# Patient Record
Sex: Female | Born: 1940 | Race: Black or African American | Hispanic: No | Smoking: Never smoker
Health system: Southern US, Community
[De-identification: ages and names within clinical notes are randomized; demographics above are authoritative.]

## PROBLEM LIST (undated history)

## (undated) DIAGNOSIS — I480 Paroxysmal atrial fibrillation: Secondary | ICD-10-CM

## (undated) DIAGNOSIS — K559 Vascular disorder of intestine, unspecified: Secondary | ICD-10-CM

## (undated) DIAGNOSIS — J45909 Unspecified asthma, uncomplicated: Secondary | ICD-10-CM

## (undated) DIAGNOSIS — I4891 Unspecified atrial fibrillation: Secondary | ICD-10-CM

## (undated) DIAGNOSIS — K922 Gastrointestinal hemorrhage, unspecified: Secondary | ICD-10-CM

## (undated) DIAGNOSIS — M545 Low back pain, unspecified: Secondary | ICD-10-CM

## (undated) DIAGNOSIS — I119 Hypertensive heart disease without heart failure: Secondary | ICD-10-CM

## (undated) DIAGNOSIS — Z860100 Personal history of colon polyps, unspecified: Secondary | ICD-10-CM

## (undated) DIAGNOSIS — K589 Irritable bowel syndrome without diarrhea: Secondary | ICD-10-CM

## (undated) DIAGNOSIS — I2510 Atherosclerotic heart disease of native coronary artery without angina pectoris: Secondary | ICD-10-CM

## (undated) DIAGNOSIS — K219 Gastro-esophageal reflux disease without esophagitis: Secondary | ICD-10-CM

## (undated) DIAGNOSIS — I639 Cerebral infarction, unspecified: Secondary | ICD-10-CM

## (undated) DIAGNOSIS — G8929 Other chronic pain: Secondary | ICD-10-CM

## (undated) DIAGNOSIS — J449 Chronic obstructive pulmonary disease, unspecified: Secondary | ICD-10-CM

## (undated) DIAGNOSIS — E119 Type 2 diabetes mellitus without complications: Secondary | ICD-10-CM

## (undated) DIAGNOSIS — M1990 Unspecified osteoarthritis, unspecified site: Secondary | ICD-10-CM

## (undated) DIAGNOSIS — G459 Transient cerebral ischemic attack, unspecified: Secondary | ICD-10-CM

## (undated) DIAGNOSIS — I10 Essential (primary) hypertension: Secondary | ICD-10-CM

## (undated) HISTORY — DX: Hypertensive heart disease without heart failure: I11.9

## (undated) HISTORY — PX: FOOT SURGERY: SHX648

## (undated) HISTORY — DX: Low back pain: M54.5

## (undated) HISTORY — DX: Low back pain, unspecified: M54.50

## (undated) HISTORY — PX: ABDOMINAL HYSTERECTOMY: SHX81

## (undated) HISTORY — DX: Unspecified atrial fibrillation: I48.91

## (undated) HISTORY — PX: STOMACH SURGERY: SHX791

## (undated) HISTORY — DX: Atherosclerotic heart disease of native coronary artery without angina pectoris: I25.10

## (undated) HISTORY — DX: Personal history of colonic polyps: Z86.010

## (undated) HISTORY — DX: Type 2 diabetes mellitus without complications: E11.9

## (undated) HISTORY — DX: Personal history of colon polyps, unspecified: Z86.0100

## (undated) HISTORY — DX: Other chronic pain: G89.29

## (undated) HISTORY — DX: Cerebral infarction, unspecified: I63.9

## (undated) HISTORY — DX: Irritable bowel syndrome, unspecified: K58.9

## (undated) HISTORY — PX: APPENDECTOMY: SHX54

## (undated) HISTORY — PX: CHOLECYSTECTOMY: SHX55

## (undated) HISTORY — DX: Unspecified asthma, uncomplicated: J45.909

## (undated) HISTORY — DX: Transient cerebral ischemic attack, unspecified: G45.9

## (undated) HISTORY — PX: BACK SURGERY: SHX140

## (undated) HISTORY — DX: Chronic obstructive pulmonary disease, unspecified: J44.9

## (undated) HISTORY — PX: TONSILLECTOMY: SUR1361

## (undated) HISTORY — DX: Vascular disorder of intestine, unspecified: K55.9

## (undated) HISTORY — DX: Gastro-esophageal reflux disease without esophagitis: K21.9

## (undated) HISTORY — PX: HEMORRHOID BANDING: SHX5850

## (undated) HISTORY — DX: Paroxysmal atrial fibrillation: I48.0

## (undated) HISTORY — DX: Gastrointestinal hemorrhage, unspecified: K92.2

## (undated) HISTORY — DX: Unspecified osteoarthritis, unspecified site: M19.90

## (undated) HISTORY — DX: Essential (primary) hypertension: I10

---

## 2002-12-31 ENCOUNTER — Encounter: Payer: Self-pay | Admitting: Neurosurgery

## 2003-01-05 ENCOUNTER — Inpatient Hospital Stay (HOSPITAL_COMMUNITY): Admission: RE | Admit: 2003-01-05 | Discharge: 2003-01-11 | Payer: Self-pay | Admitting: Neurosurgery

## 2003-01-05 ENCOUNTER — Encounter: Payer: Self-pay | Admitting: Neurosurgery

## 2003-02-28 ENCOUNTER — Encounter: Payer: Self-pay | Admitting: Neurosurgery

## 2003-02-28 ENCOUNTER — Encounter: Admission: RE | Admit: 2003-02-28 | Discharge: 2003-02-28 | Payer: Self-pay | Admitting: Neurosurgery

## 2003-04-11 ENCOUNTER — Encounter: Admission: RE | Admit: 2003-04-11 | Discharge: 2003-04-11 | Payer: Self-pay | Admitting: Neurosurgery

## 2003-04-11 ENCOUNTER — Encounter: Payer: Self-pay | Admitting: Neurosurgery

## 2003-07-12 ENCOUNTER — Encounter: Payer: Self-pay | Admitting: Neurosurgery

## 2003-07-12 ENCOUNTER — Encounter: Admission: RE | Admit: 2003-07-12 | Discharge: 2003-07-12 | Payer: Self-pay | Admitting: Neurosurgery

## 2004-03-06 ENCOUNTER — Encounter: Admission: RE | Admit: 2004-03-06 | Discharge: 2004-03-06 | Payer: Self-pay | Admitting: Neurosurgery

## 2004-10-02 ENCOUNTER — Ambulatory Visit: Payer: Self-pay | Admitting: Internal Medicine

## 2004-10-16 ENCOUNTER — Ambulatory Visit: Payer: Self-pay | Admitting: Pain Medicine

## 2004-10-22 ENCOUNTER — Ambulatory Visit: Payer: Self-pay | Admitting: Pain Medicine

## 2004-10-23 ENCOUNTER — Ambulatory Visit: Payer: Self-pay | Admitting: Internal Medicine

## 2004-10-25 ENCOUNTER — Emergency Department: Payer: Self-pay | Admitting: General Practice

## 2004-11-20 ENCOUNTER — Ambulatory Visit: Payer: Self-pay | Admitting: Pain Medicine

## 2004-11-27 ENCOUNTER — Ambulatory Visit: Payer: Self-pay | Admitting: Internal Medicine

## 2004-12-20 ENCOUNTER — Ambulatory Visit: Payer: Self-pay | Admitting: Pain Medicine

## 2004-12-23 ENCOUNTER — Ambulatory Visit: Payer: Self-pay | Admitting: Internal Medicine

## 2005-01-15 ENCOUNTER — Emergency Department: Payer: Self-pay | Admitting: Emergency Medicine

## 2005-01-17 ENCOUNTER — Ambulatory Visit: Payer: Self-pay | Admitting: Pain Medicine

## 2005-01-17 ENCOUNTER — Ambulatory Visit: Payer: Self-pay | Admitting: Internal Medicine

## 2005-01-18 ENCOUNTER — Ambulatory Visit: Payer: Self-pay | Admitting: Internal Medicine

## 2005-01-24 ENCOUNTER — Ambulatory Visit: Payer: Self-pay | Admitting: Internal Medicine

## 2005-01-28 ENCOUNTER — Ambulatory Visit: Payer: Self-pay | Admitting: Pain Medicine

## 2005-02-07 ENCOUNTER — Ambulatory Visit: Payer: Self-pay | Admitting: Internal Medicine

## 2005-02-20 ENCOUNTER — Ambulatory Visit: Payer: Self-pay | Admitting: Internal Medicine

## 2005-03-05 ENCOUNTER — Ambulatory Visit: Payer: Self-pay | Admitting: Pain Medicine

## 2005-03-13 ENCOUNTER — Ambulatory Visit: Payer: Self-pay | Admitting: Pain Medicine

## 2005-03-26 ENCOUNTER — Ambulatory Visit: Payer: Self-pay | Admitting: Internal Medicine

## 2005-04-11 ENCOUNTER — Ambulatory Visit: Payer: Self-pay | Admitting: Pain Medicine

## 2005-04-17 ENCOUNTER — Ambulatory Visit: Payer: Self-pay | Admitting: Pain Medicine

## 2005-04-22 ENCOUNTER — Ambulatory Visit: Payer: Self-pay | Admitting: Internal Medicine

## 2005-05-08 ENCOUNTER — Ambulatory Visit: Payer: Self-pay | Admitting: Internal Medicine

## 2005-05-22 ENCOUNTER — Ambulatory Visit: Payer: Self-pay | Admitting: Internal Medicine

## 2005-05-27 ENCOUNTER — Ambulatory Visit: Payer: Self-pay | Admitting: Pain Medicine

## 2005-06-17 ENCOUNTER — Emergency Department: Payer: Self-pay | Admitting: Internal Medicine

## 2005-06-17 ENCOUNTER — Other Ambulatory Visit: Payer: Self-pay

## 2005-06-20 ENCOUNTER — Ambulatory Visit: Payer: Self-pay | Admitting: Internal Medicine

## 2005-06-22 ENCOUNTER — Ambulatory Visit: Payer: Self-pay | Admitting: Internal Medicine

## 2005-06-27 ENCOUNTER — Ambulatory Visit: Payer: Self-pay | Admitting: Pain Medicine

## 2005-07-03 ENCOUNTER — Ambulatory Visit: Payer: Self-pay | Admitting: Pain Medicine

## 2005-07-25 ENCOUNTER — Ambulatory Visit: Payer: Self-pay | Admitting: Pain Medicine

## 2005-07-27 ENCOUNTER — Ambulatory Visit: Payer: Self-pay | Admitting: Internal Medicine

## 2005-09-09 ENCOUNTER — Ambulatory Visit: Payer: Self-pay | Admitting: Pain Medicine

## 2005-09-24 ENCOUNTER — Ambulatory Visit: Payer: Self-pay | Admitting: Pain Medicine

## 2005-10-24 ENCOUNTER — Ambulatory Visit: Payer: Self-pay | Admitting: Pain Medicine

## 2005-10-28 ENCOUNTER — Ambulatory Visit: Payer: Self-pay | Admitting: Pain Medicine

## 2005-11-19 ENCOUNTER — Ambulatory Visit: Payer: Self-pay | Admitting: Pain Medicine

## 2005-11-25 ENCOUNTER — Ambulatory Visit: Payer: Self-pay | Admitting: Pain Medicine

## 2005-12-26 ENCOUNTER — Ambulatory Visit: Payer: Self-pay | Admitting: Pain Medicine

## 2006-01-01 ENCOUNTER — Ambulatory Visit: Payer: Self-pay | Admitting: Pain Medicine

## 2006-01-30 ENCOUNTER — Ambulatory Visit: Payer: Self-pay | Admitting: Pain Medicine

## 2006-02-03 ENCOUNTER — Ambulatory Visit: Payer: Self-pay | Admitting: Pain Medicine

## 2006-02-25 ENCOUNTER — Ambulatory Visit: Payer: Self-pay | Admitting: Pain Medicine

## 2006-03-03 ENCOUNTER — Ambulatory Visit: Payer: Self-pay | Admitting: Pain Medicine

## 2006-03-25 ENCOUNTER — Ambulatory Visit: Payer: Self-pay | Admitting: Pain Medicine

## 2006-04-02 ENCOUNTER — Ambulatory Visit: Payer: Self-pay | Admitting: Pain Medicine

## 2006-04-22 ENCOUNTER — Ambulatory Visit: Payer: Self-pay | Admitting: Pain Medicine

## 2006-05-05 ENCOUNTER — Ambulatory Visit: Payer: Self-pay | Admitting: Pain Medicine

## 2006-05-13 ENCOUNTER — Ambulatory Visit: Payer: Self-pay | Admitting: Internal Medicine

## 2006-05-27 ENCOUNTER — Ambulatory Visit: Payer: Self-pay | Admitting: Pain Medicine

## 2006-06-04 ENCOUNTER — Ambulatory Visit: Payer: Self-pay | Admitting: Pain Medicine

## 2006-07-03 ENCOUNTER — Ambulatory Visit: Payer: Self-pay | Admitting: Pain Medicine

## 2006-07-09 ENCOUNTER — Ambulatory Visit: Payer: Self-pay | Admitting: Pain Medicine

## 2006-07-22 ENCOUNTER — Ambulatory Visit: Payer: Self-pay | Admitting: Pain Medicine

## 2006-07-28 ENCOUNTER — Ambulatory Visit: Payer: Self-pay | Admitting: Pain Medicine

## 2006-08-21 ENCOUNTER — Ambulatory Visit: Payer: Self-pay | Admitting: Pain Medicine

## 2006-08-27 ENCOUNTER — Ambulatory Visit: Payer: Self-pay | Admitting: Pain Medicine

## 2006-08-30 ENCOUNTER — Ambulatory Visit: Payer: Self-pay | Admitting: Neurosurgery

## 2006-09-16 ENCOUNTER — Ambulatory Visit: Payer: Self-pay | Admitting: Pain Medicine

## 2006-10-06 ENCOUNTER — Ambulatory Visit: Payer: Self-pay | Admitting: Pain Medicine

## 2006-10-21 ENCOUNTER — Ambulatory Visit: Payer: Self-pay | Admitting: Pain Medicine

## 2006-10-27 ENCOUNTER — Ambulatory Visit: Payer: Self-pay | Admitting: Pain Medicine

## 2006-11-25 ENCOUNTER — Ambulatory Visit: Payer: Self-pay | Admitting: Pain Medicine

## 2006-12-01 ENCOUNTER — Ambulatory Visit: Payer: Self-pay | Admitting: Pain Medicine

## 2007-01-15 ENCOUNTER — Ambulatory Visit: Payer: Self-pay | Admitting: Pain Medicine

## 2007-01-28 ENCOUNTER — Ambulatory Visit: Payer: Self-pay | Admitting: Pain Medicine

## 2007-02-17 ENCOUNTER — Ambulatory Visit: Payer: Self-pay | Admitting: Pain Medicine

## 2007-03-19 ENCOUNTER — Ambulatory Visit: Payer: Self-pay | Admitting: Pain Medicine

## 2007-03-25 ENCOUNTER — Ambulatory Visit: Payer: Self-pay | Admitting: Pain Medicine

## 2007-04-23 ENCOUNTER — Ambulatory Visit: Payer: Self-pay | Admitting: Pain Medicine

## 2007-04-27 ENCOUNTER — Ambulatory Visit: Payer: Self-pay | Admitting: Pain Medicine

## 2007-05-26 ENCOUNTER — Ambulatory Visit: Payer: Self-pay | Admitting: Pain Medicine

## 2007-05-28 ENCOUNTER — Ambulatory Visit: Payer: Self-pay | Admitting: Internal Medicine

## 2007-06-03 ENCOUNTER — Ambulatory Visit: Payer: Self-pay | Admitting: Pain Medicine

## 2007-06-23 ENCOUNTER — Ambulatory Visit: Payer: Self-pay | Admitting: Pain Medicine

## 2007-07-28 ENCOUNTER — Ambulatory Visit: Payer: Self-pay | Admitting: Pain Medicine

## 2007-08-05 ENCOUNTER — Ambulatory Visit: Payer: Self-pay | Admitting: Pain Medicine

## 2007-08-25 ENCOUNTER — Ambulatory Visit: Payer: Self-pay | Admitting: Pain Medicine

## 2007-09-21 ENCOUNTER — Ambulatory Visit: Payer: Self-pay | Admitting: Pain Medicine

## 2007-10-22 ENCOUNTER — Ambulatory Visit: Payer: Self-pay | Admitting: Pain Medicine

## 2007-10-26 ENCOUNTER — Ambulatory Visit: Payer: Self-pay | Admitting: Pain Medicine

## 2007-11-17 ENCOUNTER — Ambulatory Visit: Payer: Self-pay | Admitting: Pain Medicine

## 2007-11-25 ENCOUNTER — Ambulatory Visit: Payer: Self-pay | Admitting: Pain Medicine

## 2007-12-29 ENCOUNTER — Ambulatory Visit: Payer: Self-pay | Admitting: Pain Medicine

## 2008-01-04 ENCOUNTER — Ambulatory Visit: Payer: Self-pay | Admitting: Pain Medicine

## 2008-01-28 ENCOUNTER — Ambulatory Visit: Payer: Self-pay | Admitting: Pain Medicine

## 2008-02-25 ENCOUNTER — Ambulatory Visit: Payer: Self-pay | Admitting: Pain Medicine

## 2008-02-29 ENCOUNTER — Ambulatory Visit: Payer: Self-pay | Admitting: Pain Medicine

## 2008-04-04 ENCOUNTER — Ambulatory Visit: Payer: Self-pay | Admitting: Pain Medicine

## 2008-04-28 ENCOUNTER — Ambulatory Visit: Payer: Self-pay | Admitting: Pain Medicine

## 2008-05-04 ENCOUNTER — Ambulatory Visit: Payer: Self-pay | Admitting: Pain Medicine

## 2008-05-31 ENCOUNTER — Ambulatory Visit: Payer: Self-pay | Admitting: Pain Medicine

## 2008-06-04 ENCOUNTER — Emergency Department: Payer: Self-pay | Admitting: Emergency Medicine

## 2008-06-06 ENCOUNTER — Ambulatory Visit: Payer: Self-pay | Admitting: Pain Medicine

## 2008-06-28 ENCOUNTER — Ambulatory Visit: Payer: Self-pay | Admitting: Pain Medicine

## 2008-07-05 ENCOUNTER — Ambulatory Visit: Payer: Self-pay | Admitting: Internal Medicine

## 2008-07-06 ENCOUNTER — Ambulatory Visit: Payer: Self-pay | Admitting: Pain Medicine

## 2008-07-28 ENCOUNTER — Ambulatory Visit: Payer: Self-pay | Admitting: Pain Medicine

## 2008-08-01 ENCOUNTER — Ambulatory Visit: Payer: Self-pay | Admitting: Pain Medicine

## 2008-08-30 ENCOUNTER — Ambulatory Visit: Payer: Self-pay | Admitting: Pain Medicine

## 2008-09-12 ENCOUNTER — Ambulatory Visit: Payer: Self-pay | Admitting: Pain Medicine

## 2008-10-11 ENCOUNTER — Ambulatory Visit: Payer: Self-pay | Admitting: Pain Medicine

## 2008-10-13 ENCOUNTER — Ambulatory Visit: Payer: Self-pay | Admitting: Gastroenterology

## 2008-11-09 ENCOUNTER — Ambulatory Visit: Payer: Self-pay | Admitting: Pain Medicine

## 2008-12-08 ENCOUNTER — Ambulatory Visit: Payer: Self-pay | Admitting: Pain Medicine

## 2008-12-26 ENCOUNTER — Ambulatory Visit: Payer: Self-pay | Admitting: Pain Medicine

## 2009-01-10 ENCOUNTER — Ambulatory Visit: Payer: Self-pay | Admitting: Pain Medicine

## 2009-02-05 ENCOUNTER — Inpatient Hospital Stay: Payer: Self-pay | Admitting: Internal Medicine

## 2009-02-15 ENCOUNTER — Ambulatory Visit: Payer: Self-pay | Admitting: Pain Medicine

## 2009-02-28 ENCOUNTER — Ambulatory Visit: Payer: Self-pay | Admitting: Pain Medicine

## 2009-04-04 ENCOUNTER — Ambulatory Visit: Payer: Self-pay | Admitting: Pain Medicine

## 2009-04-12 ENCOUNTER — Ambulatory Visit: Payer: Self-pay | Admitting: Pain Medicine

## 2009-04-27 ENCOUNTER — Ambulatory Visit: Payer: Self-pay | Admitting: Pain Medicine

## 2009-06-13 ENCOUNTER — Ambulatory Visit: Payer: Self-pay | Admitting: Pain Medicine

## 2009-06-19 ENCOUNTER — Ambulatory Visit: Payer: Self-pay | Admitting: Pain Medicine

## 2009-07-11 ENCOUNTER — Ambulatory Visit: Payer: Self-pay | Admitting: Pain Medicine

## 2009-08-10 ENCOUNTER — Ambulatory Visit: Payer: Self-pay | Admitting: Pain Medicine

## 2009-08-17 ENCOUNTER — Ambulatory Visit: Payer: Self-pay | Admitting: Family Medicine

## 2009-08-21 ENCOUNTER — Ambulatory Visit: Payer: Self-pay | Admitting: Pain Medicine

## 2009-08-30 ENCOUNTER — Ambulatory Visit: Payer: Self-pay | Admitting: Family Medicine

## 2009-09-12 ENCOUNTER — Ambulatory Visit: Payer: Self-pay | Admitting: Pain Medicine

## 2009-09-22 ENCOUNTER — Emergency Department: Payer: Self-pay

## 2009-10-10 ENCOUNTER — Ambulatory Visit: Payer: Self-pay | Admitting: Pain Medicine

## 2009-10-13 ENCOUNTER — Ambulatory Visit: Payer: Self-pay | Admitting: Family Medicine

## 2009-10-16 ENCOUNTER — Ambulatory Visit: Payer: Self-pay | Admitting: Pain Medicine

## 2009-11-09 ENCOUNTER — Ambulatory Visit: Payer: Self-pay | Admitting: Pain Medicine

## 2009-11-14 ENCOUNTER — Ambulatory Visit: Payer: Self-pay | Admitting: Pain Medicine

## 2009-11-15 ENCOUNTER — Ambulatory Visit: Payer: Self-pay | Admitting: Pain Medicine

## 2009-11-20 ENCOUNTER — Ambulatory Visit: Payer: Self-pay | Admitting: Pain Medicine

## 2009-11-29 ENCOUNTER — Ambulatory Visit: Payer: Self-pay | Admitting: Pain Medicine

## 2009-12-28 ENCOUNTER — Ambulatory Visit: Payer: Self-pay | Admitting: Pain Medicine

## 2010-01-03 ENCOUNTER — Ambulatory Visit: Payer: Self-pay | Admitting: Pain Medicine

## 2010-01-30 ENCOUNTER — Ambulatory Visit: Payer: Self-pay | Admitting: Pain Medicine

## 2010-02-27 ENCOUNTER — Ambulatory Visit: Payer: Self-pay | Admitting: Pain Medicine

## 2010-03-05 ENCOUNTER — Ambulatory Visit: Payer: Self-pay | Admitting: Pain Medicine

## 2010-03-29 ENCOUNTER — Ambulatory Visit: Payer: Self-pay | Admitting: Pain Medicine

## 2010-04-26 ENCOUNTER — Ambulatory Visit: Payer: Self-pay | Admitting: Pain Medicine

## 2010-05-02 ENCOUNTER — Ambulatory Visit: Payer: Self-pay | Admitting: Pain Medicine

## 2010-05-29 ENCOUNTER — Ambulatory Visit: Payer: Self-pay | Admitting: Pain Medicine

## 2010-06-28 ENCOUNTER — Ambulatory Visit: Payer: Self-pay | Admitting: Pain Medicine

## 2010-07-04 ENCOUNTER — Ambulatory Visit: Payer: Self-pay | Admitting: Pain Medicine

## 2010-07-10 ENCOUNTER — Emergency Department: Payer: Self-pay | Admitting: Emergency Medicine

## 2010-07-11 ENCOUNTER — Ambulatory Visit: Payer: Self-pay | Admitting: Pain Medicine

## 2010-07-18 ENCOUNTER — Ambulatory Visit: Payer: Self-pay | Admitting: Internal Medicine

## 2010-07-26 ENCOUNTER — Ambulatory Visit: Payer: Self-pay | Admitting: Pain Medicine

## 2010-08-03 ENCOUNTER — Ambulatory Visit: Payer: Self-pay | Admitting: Neurosurgery

## 2010-08-23 ENCOUNTER — Ambulatory Visit: Payer: Self-pay | Admitting: Pain Medicine

## 2010-08-29 ENCOUNTER — Ambulatory Visit: Payer: Self-pay | Admitting: Internal Medicine

## 2010-09-05 ENCOUNTER — Ambulatory Visit: Payer: Self-pay | Admitting: Unknown Physician Specialty

## 2010-09-25 ENCOUNTER — Ambulatory Visit: Payer: Self-pay | Admitting: Pain Medicine

## 2010-10-03 ENCOUNTER — Ambulatory Visit: Payer: Self-pay | Admitting: Pain Medicine

## 2010-10-29 ENCOUNTER — Ambulatory Visit: Payer: Self-pay | Admitting: Pain Medicine

## 2010-11-22 ENCOUNTER — Ambulatory Visit: Payer: Self-pay | Admitting: Pain Medicine

## 2010-12-23 HISTORY — PX: OTHER SURGICAL HISTORY: SHX169

## 2010-12-31 ENCOUNTER — Ambulatory Visit: Payer: Self-pay | Admitting: Pain Medicine

## 2011-01-09 ENCOUNTER — Ambulatory Visit: Payer: Self-pay | Admitting: Pain Medicine

## 2011-01-29 ENCOUNTER — Ambulatory Visit: Payer: Self-pay | Admitting: Pain Medicine

## 2011-01-30 ENCOUNTER — Ambulatory Visit: Payer: Self-pay | Admitting: Gastroenterology

## 2011-02-03 LAB — PATHOLOGY REPORT

## 2011-02-28 ENCOUNTER — Ambulatory Visit: Payer: Self-pay | Admitting: Pain Medicine

## 2011-03-04 ENCOUNTER — Ambulatory Visit: Payer: Self-pay | Admitting: Pain Medicine

## 2011-03-28 ENCOUNTER — Ambulatory Visit: Payer: Self-pay | Admitting: Pain Medicine

## 2011-04-10 ENCOUNTER — Ambulatory Visit: Payer: Self-pay | Admitting: Pain Medicine

## 2011-04-30 ENCOUNTER — Ambulatory Visit: Payer: Self-pay | Admitting: Pain Medicine

## 2011-05-06 ENCOUNTER — Ambulatory Visit: Payer: Self-pay | Admitting: Pain Medicine

## 2011-05-28 ENCOUNTER — Ambulatory Visit: Payer: Self-pay | Admitting: Pain Medicine

## 2011-06-05 ENCOUNTER — Ambulatory Visit: Payer: Self-pay | Admitting: Pain Medicine

## 2011-06-25 ENCOUNTER — Ambulatory Visit: Payer: Self-pay | Admitting: Pain Medicine

## 2011-07-03 ENCOUNTER — Ambulatory Visit: Payer: Self-pay | Admitting: Pain Medicine

## 2011-07-25 ENCOUNTER — Ambulatory Visit: Payer: Self-pay | Admitting: Pain Medicine

## 2011-08-05 ENCOUNTER — Ambulatory Visit: Payer: Self-pay | Admitting: Orthopedic Surgery

## 2011-08-13 ENCOUNTER — Inpatient Hospital Stay: Payer: Self-pay | Admitting: Orthopedic Surgery

## 2011-08-15 LAB — PATHOLOGY REPORT

## 2011-09-19 ENCOUNTER — Ambulatory Visit: Payer: Self-pay | Admitting: Pain Medicine

## 2011-10-15 ENCOUNTER — Ambulatory Visit: Payer: Self-pay | Admitting: Pain Medicine

## 2011-11-12 ENCOUNTER — Ambulatory Visit: Payer: Self-pay | Admitting: Pain Medicine

## 2011-11-20 ENCOUNTER — Ambulatory Visit: Payer: Self-pay | Admitting: Pain Medicine

## 2011-12-10 ENCOUNTER — Ambulatory Visit: Payer: Self-pay | Admitting: Pain Medicine

## 2012-01-06 ENCOUNTER — Ambulatory Visit: Payer: Self-pay | Admitting: Pain Medicine

## 2012-01-22 ENCOUNTER — Ambulatory Visit: Payer: Self-pay | Admitting: Gastroenterology

## 2012-01-23 ENCOUNTER — Ambulatory Visit: Payer: Self-pay | Admitting: Gastroenterology

## 2012-01-24 LAB — PATHOLOGY REPORT

## 2012-02-06 ENCOUNTER — Ambulatory Visit: Payer: Self-pay | Admitting: Pain Medicine

## 2012-02-17 ENCOUNTER — Ambulatory Visit: Payer: Self-pay | Admitting: Internal Medicine

## 2012-03-04 ENCOUNTER — Ambulatory Visit: Payer: Self-pay | Admitting: Gastroenterology

## 2012-03-09 ENCOUNTER — Ambulatory Visit: Payer: Self-pay | Admitting: Pain Medicine

## 2012-03-24 ENCOUNTER — Emergency Department: Payer: Self-pay | Admitting: Emergency Medicine

## 2012-03-24 LAB — URINALYSIS, COMPLETE
Bacteria: NONE SEEN
Bilirubin,UR: NEGATIVE
Leukocyte Esterase: NEGATIVE
Nitrite: NEGATIVE
Ph: 5 (ref 4.5–8.0)
Protein: NEGATIVE
RBC,UR: 1 /HPF (ref 0–5)
Specific Gravity: 1.009 (ref 1.003–1.030)
Squamous Epithelial: <1
WBC UR: 1 /HPF (ref 0–5)

## 2012-03-24 LAB — COMPREHENSIVE METABOLIC PANEL
Albumin: 3.9 g/dL (ref 3.4–5.0)
Calcium, Total: 9.5 mg/dL (ref 8.5–10.1)
Creatinine: 1.19 mg/dL (ref 0.60–1.30)
EGFR (African American): 57 — ABNORMAL LOW
Potassium: 5 mmol/L (ref 3.5–5.1)
SGPT (ALT): 20 U/L
Sodium: 141 mmol/L (ref 136–145)
Total Protein: 8.1 g/dL (ref 6.4–8.2)

## 2012-03-24 LAB — CBC
HGB: 11.6 g/dL — ABNORMAL LOW (ref 12.0–16.0)
Platelet: 357 10*3/uL (ref 150–440)
RBC: 3.99 10*6/uL (ref 3.80–5.20)
RDW: 12.9 % (ref 11.5–14.5)

## 2012-03-24 LAB — LIPASE, BLOOD: Lipase: 91 U/L (ref 73–393)

## 2012-03-24 LAB — TROPONIN I: Troponin-I: <0.02 ng/mL

## 2012-03-31 ENCOUNTER — Ambulatory Visit: Payer: Self-pay | Admitting: Pain Medicine

## 2012-04-07 ENCOUNTER — Ambulatory Visit: Payer: Self-pay | Admitting: Gastroenterology

## 2012-04-08 ENCOUNTER — Ambulatory Visit: Payer: Self-pay | Admitting: Pain Medicine

## 2012-05-12 ENCOUNTER — Ambulatory Visit: Payer: Self-pay | Admitting: Pain Medicine

## 2012-05-20 ENCOUNTER — Ambulatory Visit: Payer: Self-pay | Admitting: Pain Medicine

## 2012-06-11 ENCOUNTER — Ambulatory Visit: Payer: Self-pay | Admitting: Pain Medicine

## 2012-06-22 ENCOUNTER — Ambulatory Visit: Payer: Self-pay | Admitting: Pain Medicine

## 2012-07-14 ENCOUNTER — Ambulatory Visit: Payer: Self-pay | Admitting: Pain Medicine

## 2012-08-10 ENCOUNTER — Ambulatory Visit: Payer: Self-pay | Admitting: Pain Medicine

## 2012-09-10 ENCOUNTER — Ambulatory Visit: Payer: Self-pay | Admitting: Pain Medicine

## 2012-09-21 ENCOUNTER — Ambulatory Visit: Payer: Self-pay | Admitting: Pain Medicine

## 2012-10-07 ENCOUNTER — Ambulatory Visit: Payer: Self-pay | Admitting: Gastroenterology

## 2012-10-08 ENCOUNTER — Ambulatory Visit: Payer: Self-pay | Admitting: Gastroenterology

## 2012-10-08 ENCOUNTER — Ambulatory Visit: Payer: Self-pay | Admitting: Pain Medicine

## 2012-10-12 ENCOUNTER — Ambulatory Visit: Payer: Self-pay | Admitting: Gastroenterology

## 2012-10-21 ENCOUNTER — Ambulatory Visit: Payer: Self-pay | Admitting: Gastroenterology

## 2012-11-10 ENCOUNTER — Ambulatory Visit: Payer: Self-pay | Admitting: Pain Medicine

## 2012-11-25 ENCOUNTER — Ambulatory Visit: Payer: Self-pay | Admitting: Pain Medicine

## 2012-12-10 ENCOUNTER — Ambulatory Visit: Payer: Self-pay | Admitting: Pain Medicine

## 2012-12-30 ENCOUNTER — Ambulatory Visit: Payer: Self-pay | Admitting: Pain Medicine

## 2013-01-15 ENCOUNTER — Emergency Department: Payer: Self-pay | Admitting: Emergency Medicine

## 2013-01-15 LAB — COMPREHENSIVE METABOLIC PANEL
Albumin: 3.9 g/dL (ref 3.4–5.0)
Alkaline Phosphatase: 132 U/L (ref 50–136)
Anion Gap: 7 (ref 7–16)
BUN: 14 mg/dL (ref 7–18)
Bilirubin,Total: 0.3 mg/dL (ref 0.2–1.0)
Calcium, Total: 9.7 mg/dL (ref 8.5–10.1)
Chloride: 108 mmol/L — ABNORMAL HIGH (ref 98–107)
Co2: 26 mmol/L (ref 21–32)
EGFR (African American): >60
Glucose: 106 mg/dL — ABNORMAL HIGH (ref 65–99)
Osmolality: 282 (ref 275–301)
SGOT(AST): 19 U/L (ref 15–37)
SGPT (ALT): 21 U/L (ref 12–78)
Sodium: 141 mmol/L (ref 136–145)

## 2013-01-15 LAB — CBC
HCT: 34.8 % — ABNORMAL LOW (ref 35.0–47.0)
MCHC: 33.1 g/dL (ref 32.0–36.0)
MCV: 91 fL (ref 80–100)
Platelet: 267 10*3/uL (ref 150–440)
RBC: 3.85 10*6/uL (ref 3.80–5.20)
RDW: 13.8 % (ref 11.5–14.5)

## 2013-01-15 LAB — URINALYSIS, COMPLETE
Bilirubin,UR: NEGATIVE
Blood: NEGATIVE
Ketone: NEGATIVE
Leukocyte Esterase: NEGATIVE
Nitrite: NEGATIVE
Ph: 5 (ref 4.5–8.0)
RBC,UR: 1 /HPF (ref 0–5)
Squamous Epithelial: NONE SEEN
WBC UR: <1 /HPF (ref 0–5)

## 2013-01-15 LAB — TROPONIN I: Troponin-I: <0.02 ng/mL

## 2013-02-02 ENCOUNTER — Ambulatory Visit: Payer: Self-pay | Admitting: Pain Medicine

## 2013-02-08 ENCOUNTER — Ambulatory Visit: Payer: Self-pay | Admitting: Pain Medicine

## 2013-03-23 ENCOUNTER — Ambulatory Visit: Payer: Self-pay | Admitting: Pain Medicine

## 2013-04-21 ENCOUNTER — Ambulatory Visit: Payer: Self-pay | Admitting: Pain Medicine

## 2013-05-19 ENCOUNTER — Ambulatory Visit: Payer: Self-pay | Admitting: Pain Medicine

## 2013-06-14 ENCOUNTER — Emergency Department: Payer: Self-pay | Admitting: Emergency Medicine

## 2013-06-14 LAB — URINALYSIS, COMPLETE
Bilirubin,UR: NEGATIVE
Blood: NEGATIVE
Glucose,UR: NEGATIVE mg/dL (ref 0–75)
Ketone: NEGATIVE
Leukocyte Esterase: NEGATIVE
Nitrite: NEGATIVE
Ph: 6 (ref 4.5–8.0)
Protein: NEGATIVE
RBC,UR: <1 /HPF (ref 0–5)

## 2013-06-17 ENCOUNTER — Ambulatory Visit: Payer: Self-pay | Admitting: Pain Medicine

## 2013-06-17 ENCOUNTER — Emergency Department: Payer: Self-pay | Admitting: Emergency Medicine

## 2013-06-17 LAB — TROPONIN I: Troponin-I: <0.02 ng/mL

## 2013-06-17 LAB — COMPREHENSIVE METABOLIC PANEL
Alkaline Phosphatase: 110 U/L (ref 50–136)
Anion Gap: 4 — ABNORMAL LOW (ref 7–16)
BUN: 22 mg/dL — ABNORMAL HIGH (ref 7–18)
Chloride: 105 mmol/L (ref 98–107)
Creatinine: 1.06 mg/dL (ref 0.60–1.30)
Glucose: 134 mg/dL — ABNORMAL HIGH (ref 65–99)
Potassium: 4 mmol/L (ref 3.5–5.1)
SGOT(AST): 15 U/L (ref 15–37)
Total Protein: 7.9 g/dL (ref 6.4–8.2)

## 2013-06-17 LAB — CBC
HCT: 33.6 % — ABNORMAL LOW (ref 35.0–47.0)
MCHC: 33.9 g/dL (ref 32.0–36.0)
RBC: 3.78 10*6/uL — ABNORMAL LOW (ref 3.80–5.20)
RDW: 13.9 % (ref 11.5–14.5)

## 2013-06-17 LAB — CK TOTAL AND CKMB (NOT AT ARMC): CK, Total: 59 U/L (ref 21–215)

## 2013-06-23 ENCOUNTER — Ambulatory Visit: Payer: Self-pay | Admitting: Pain Medicine

## 2013-07-13 ENCOUNTER — Encounter: Payer: Self-pay | Admitting: *Deleted

## 2013-07-14 ENCOUNTER — Encounter: Payer: Self-pay | Admitting: General Surgery

## 2013-07-14 ENCOUNTER — Ambulatory Visit: Payer: Medicare Other | Admitting: General Surgery

## 2013-07-14 VITALS — BP 162/80 | HR 64 | Resp 14 | Ht 64.5 in | Wt 172.0 lb

## 2013-07-14 DIAGNOSIS — K648 Other hemorrhoids: Secondary | ICD-10-CM

## 2013-07-14 DIAGNOSIS — K645 Perianal venous thrombosis: Secondary | ICD-10-CM

## 2013-07-14 NOTE — Progress Notes (Signed)
Patient ID: Yvette Collier, female   DOB: 1941-02-04, 72 y.o.   MRN: [id number]  Chief Complaint  Patient presents with  . Other    hemorrhoids    HPI Yvette Collier is a 72 y.o. female here today for hemorrhoids. The patient states Yvette Collier is having rectal bleeding for approximately 1 year. When her IBS acts up Yvette Collier states the bleeding is worse. Yvette Collier notices the blood when wiping.   HPI  Past Medical History  Diagnosis Date  . GERD (gastroesophageal reflux disease)   . Asthma   . Diabetes mellitus without complication   . Heart murmur   . IBS (irritable bowel syndrome)     Past Surgical History  Procedure Laterality Date  . Foot surgery Right   . Stomach surgery      History reviewed. No pertinent family history.  Social History History  Substance Use Topics  . Smoking status: Former Smoker -- 1.00 packs/day for 1 years  . Smokeless tobacco: Never Used  . Alcohol Use: No    Allergies  Allergen Reactions  . Effexor (Venlafaxine) Hives  . Ivp Dye (Iodinated Diagnostic Agents) Hives  . Sulfa Antibiotics Rash    Current Outpatient Prescriptions  Medication Sig Dispense Refill  . aspirin 81 MG tablet Take 81 mg by mouth daily.      Marland Kitchen DEXILANT 60 MG capsule Take 1 capsule by mouth daily.      . fexofenadine (ALLEGRA) 180 MG tablet Take 180 mg by mouth daily.      . ramipril (ALTACE) 10 MG capsule Take 1 capsule by mouth daily.      . simvastatin (ZOCOR) 20 MG tablet Take 1 tablet by mouth daily.       No current facility-administered medications for this visit.    Review of Systems Review of Systems  Constitutional: Positive for chills and appetite change. Negative for fever, diaphoresis, activity change, fatigue and unexpected weight change.  Respiratory: Negative.   Cardiovascular: Negative.   Gastrointestinal: Positive for abdominal pain, diarrhea, constipation, anal bleeding and rectal pain. Negative for nausea, vomiting and abdominal distention.    Blood pressure  162/80, pulse 64, resp. rate 14, height 5' 4.5" (1.638 m), weight 172 lb (78.019 kg).  Physical Exam Physical Exam  Constitutional: Yvette Collier is oriented to person, place, and time. Yvette Collier appears well-developed and well-nourished.  Eyes: Conjunctivae are normal. No scleral icterus.  Cardiovascular: Normal rate, regular rhythm and normal heart sounds.   No murmur heard. Pulmonary/Chest: Effort normal and breath sounds normal.  Abdominal: Soft. There is no tenderness.  Genitourinary: Rectal exam shows internal hemorrhoid (2 cm ulcerated located anteriorly. Tendency to prolapse. ).  Neurological: Yvette Collier is alert and oriented to person, place, and time.  Skin: Skin is warm and dry.    Data Reviewed None  Assessment    Internal hemorrhoid symptomatic.      Plan    Patient advised to have anoscopy and hemorrhoid banding at a later date.  This patient will do one (1) fleets enema one hour prior to coming to the office. Yvette Collier is aware of instructions and verbalizes understanding.        Kemya Shed G 07/14/2013, 6:54 PM

## 2013-07-14 NOTE — Patient Instructions (Addendum)
Patient to return for hemorrhoid banding. She is advised to use an enema prior to procedure.

## 2013-07-20 ENCOUNTER — Ambulatory Visit: Payer: Self-pay | Admitting: Pain Medicine

## 2013-07-27 ENCOUNTER — Ambulatory Visit: Payer: Self-pay | Admitting: Internal Medicine

## 2013-07-29 ENCOUNTER — Ambulatory Visit (INDEPENDENT_AMBULATORY_CARE_PROVIDER_SITE_OTHER): Payer: Medicare Other | Admitting: General Surgery

## 2013-07-29 ENCOUNTER — Encounter: Payer: Self-pay | Admitting: General Surgery

## 2013-07-29 VITALS — BP 122/82 | HR 76 | Resp 14 | Ht 64.0 in | Wt 168.0 lb

## 2013-07-29 DIAGNOSIS — K648 Other hemorrhoids: Secondary | ICD-10-CM | POA: Diagnosis not present

## 2013-07-29 NOTE — Patient Instructions (Addendum)
Patient to return in 2 weeks  

## 2013-07-29 NOTE — Progress Notes (Signed)
Patient ID: Yvette Collier, female   DOB: 12/08/1941, 72 y.o.   MRN: [id number]  Chief Complaint  Patient presents with  . Other    hemorrhoid banding    HPI Yvette Collier is a 72 y.o. female who presents for hemorrhoid banding.    HPI  Past Medical History  Diagnosis Date  . GERD (gastroesophageal reflux disease)   . Asthma   . Diabetes mellitus without complication   . Heart murmur   . IBS (irritable bowel syndrome)     Past Surgical History  Procedure Laterality Date  . Foot surgery Right   . Stomach surgery      History reviewed. No pertinent family history.  Social History History  Substance Use Topics  . Smoking status: Former Smoker -- 1.00 packs/day for 1 years  . Smokeless tobacco: Never Used  . Alcohol Use: No    Allergies  Allergen Reactions  . Effexor (Venlafaxine) Hives  . Ivp Dye (Iodinated Diagnostic Agents) Hives  . Sulfa Antibiotics Rash    Current Outpatient Prescriptions  Medication Sig Dispense Refill  . aspirin 81 MG tablet Take 81 mg by mouth daily.      Marland Kitchen DEXILANT 60 MG capsule Take 1 capsule by mouth daily.      . fexofenadine (ALLEGRA) 180 MG tablet Take 180 mg by mouth daily.      . ramipril (ALTACE) 10 MG capsule Take 1 capsule by mouth daily.      . simvastatin (ZOCOR) 20 MG tablet Take 1 tablet by mouth daily.       No current facility-administered medications for this visit.    Review of Systems Review of Systems  Constitutional: Negative.   Respiratory: Negative.   Cardiovascular: Negative.   Gastrointestinal: Positive for constipation and anal bleeding.    Height 5\' 4"  (1.626 m), weight 168 lb (76.204 kg).  Physical Exam Physical Exam Anoscopy completed with pt in left lateral position. There is a prominent internal hemorrhoid with mild surface irritation located 11 o'cl. No other findings.  With consent the internal; hemorrhoid was successfully banded after betadine prep and 1 ml 1% xylocaine. No immediate problems  from procedure.  Data Reviewed    Assessment    Internal hemorrhoid     Plan    RTC 2 weeks        Darnelle Catalan 07/29/2013, 9:39 AM

## 2013-08-04 ENCOUNTER — Ambulatory Visit: Payer: Self-pay | Admitting: Pain Medicine

## 2013-08-11 ENCOUNTER — Ambulatory Visit (INDEPENDENT_AMBULATORY_CARE_PROVIDER_SITE_OTHER): Payer: Medicare Other | Admitting: General Surgery

## 2013-08-11 ENCOUNTER — Encounter: Payer: Self-pay | Admitting: General Surgery

## 2013-08-11 VITALS — BP 120/72 | HR 58 | Resp 12 | Ht 64.0 in | Wt 166.0 lb

## 2013-08-11 DIAGNOSIS — K648 Other hemorrhoids: Secondary | ICD-10-CM

## 2013-08-11 NOTE — Patient Instructions (Signed)
Follow on an as needed basis

## 2013-08-11 NOTE — Progress Notes (Signed)
Patient ID: Yvette Collier, female   DOB: 07-04-1941, 72 y.o.   MRN: [id number]  Chief Complaint  Patient presents with  . Follow-up    HPI Yvette Collier is a 72 y.o. female.  Patient here today for follow up hemorrhoid.  States only occasional bleeding, not with every BM.  States she is better. HPI  Past Medical History  Diagnosis Date  . GERD (gastroesophageal reflux disease)   . Asthma   . Diabetes mellitus without complication   . Heart murmur   . IBS (irritable bowel syndrome)     Past Surgical History  Procedure Laterality Date  . Foot surgery Right   . Stomach surgery      History reviewed. No pertinent family history.  Social History History  Substance Use Topics  . Smoking status: Former Smoker -- 1.00 packs/day for 1 years  . Smokeless tobacco: Never Used  . Alcohol Use: No    Allergies  Allergen Reactions  . Effexor [Venlafaxine] Hives  . Ivp Dye [Iodinated Diagnostic Agents] Hives  . Sulfa Antibiotics Rash    Current Outpatient Prescriptions  Medication Sig Dispense Refill  . aspirin 81 MG tablet Take 81 mg by mouth daily.      Marland Kitchen DEXILANT 60 MG capsule Take 1 capsule by mouth daily.      . fexofenadine (ALLEGRA) 180 MG tablet Take 180 mg by mouth daily.      Marland Kitchen glipiZIDE (GLUCOTROL) 5 MG tablet Take 5 mg by mouth daily.       . isosorbide mononitrate (IMDUR) 30 MG 24 hr tablet Take 30 mg by mouth daily.       . metoCLOPramide (REGLAN) 5 MG tablet Take 5 mg by mouth 3 (three) times daily.      . metoprolol succinate (TOPROL-XL) 50 MG 24 hr tablet Take 50 mg by mouth daily. Take with or immediately following a meal.      . oxyCODONE (OXY IR/ROXICODONE) 5 MG immediate release tablet Take 3-6 tablets by mouth daily.      . ramipril (ALTACE) 10 MG capsule Take 1 capsule by mouth daily.      . simvastatin (ZOCOR) 20 MG tablet Take 1 tablet by mouth daily.       No current facility-administered medications for this visit.    Review of Systems Review of  Systems  Constitutional: Negative.   Respiratory: Negative.   Cardiovascular: Negative.     Blood pressure 120/72, pulse 58, resp. rate 12, height 5\' 4"  (1.626 m), weight 166 lb (75.297 kg).  Physical Exam Physical Exam  Constitutional: She is oriented to person, place, and time. She appears well-developed and well-nourished.  Neurological: She is alert and oriented to person, place, and time.  Skin: Skin is warm and dry.   Banded hemorrhoid is not seen. The banded area looks clean. No new findings.  Data Reviewed none  Assessment    Banded hemorrhoid not seen on todays exam.    Plan    Follow on an as needed basis.       Dino Borntreger G 08/11/2013, 1:07 PM

## 2013-08-19 ENCOUNTER — Ambulatory Visit: Payer: Self-pay | Admitting: Pain Medicine

## 2013-08-30 ENCOUNTER — Ambulatory Visit: Payer: Self-pay | Admitting: Pain Medicine

## 2013-09-21 ENCOUNTER — Ambulatory Visit: Payer: Self-pay | Admitting: Pain Medicine

## 2013-09-27 ENCOUNTER — Emergency Department: Payer: Self-pay | Admitting: Emergency Medicine

## 2013-09-27 LAB — COMPREHENSIVE METABOLIC PANEL
BUN: 9 mg/dL (ref 7–18)
Bilirubin,Total: 0.3 mg/dL (ref 0.2–1.0)
Calcium, Total: 10.3 mg/dL — ABNORMAL HIGH (ref 8.5–10.1)
Co2: 28 mmol/L (ref 21–32)
Creatinine: 0.95 mg/dL (ref 0.60–1.30)
EGFR (African American): >60
EGFR (Non-African Amer.): 59 — ABNORMAL LOW
SGOT(AST): 29 U/L (ref 15–37)
Sodium: 139 mmol/L (ref 136–145)

## 2013-09-27 LAB — CBC
MCH: 30.4 pg (ref 26.0–34.0)
MCHC: 33.3 g/dL (ref 32.0–36.0)
MCV: 91 fL (ref 80–100)
Platelet: 331 10*3/uL (ref 150–440)
RDW: 13.3 % (ref 11.5–14.5)
WBC: 7.1 10*3/uL (ref 3.6–11.0)

## 2013-09-27 LAB — TROPONIN I: Troponin-I: <0.02 ng/mL

## 2013-10-02 LAB — CULTURE, BLOOD (SINGLE)

## 2013-10-25 ENCOUNTER — Ambulatory Visit: Payer: Self-pay | Admitting: Pain Medicine

## 2013-11-01 ENCOUNTER — Ambulatory Visit: Payer: Self-pay | Admitting: Pain Medicine

## 2013-11-08 ENCOUNTER — Ambulatory Visit: Payer: Self-pay | Admitting: Gastroenterology

## 2013-11-23 ENCOUNTER — Ambulatory Visit: Payer: Self-pay | Admitting: Pain Medicine

## 2013-12-06 ENCOUNTER — Ambulatory Visit: Payer: Self-pay | Admitting: Pain Medicine

## 2013-12-27 ENCOUNTER — Ambulatory Visit: Payer: Self-pay | Admitting: Pain Medicine

## 2014-01-03 ENCOUNTER — Ambulatory Visit: Payer: Self-pay | Admitting: Pain Medicine

## 2014-01-25 ENCOUNTER — Ambulatory Visit: Payer: Self-pay | Admitting: Pain Medicine

## 2014-02-23 ENCOUNTER — Ambulatory Visit: Payer: Self-pay | Admitting: Pain Medicine

## 2014-03-30 ENCOUNTER — Ambulatory Visit: Payer: Self-pay | Admitting: Pain Medicine

## 2014-04-21 ENCOUNTER — Ambulatory Visit: Payer: Self-pay | Admitting: Pain Medicine

## 2014-04-27 DIAGNOSIS — L600 Ingrowing nail: Secondary | ICD-10-CM | POA: Insufficient documentation

## 2014-05-02 ENCOUNTER — Ambulatory Visit: Payer: Self-pay | Admitting: Pain Medicine

## 2014-05-17 ENCOUNTER — Ambulatory Visit: Payer: Self-pay | Admitting: Pain Medicine

## 2014-05-25 ENCOUNTER — Ambulatory Visit: Payer: Self-pay | Admitting: Pain Medicine

## 2014-06-22 ENCOUNTER — Ambulatory Visit: Payer: Self-pay | Admitting: Pain Medicine

## 2014-07-04 ENCOUNTER — Ambulatory Visit: Payer: Self-pay | Admitting: Pain Medicine

## 2014-07-18 ENCOUNTER — Ambulatory Visit: Payer: Self-pay | Admitting: Pain Medicine

## 2014-07-27 ENCOUNTER — Ambulatory Visit: Payer: Self-pay | Admitting: Pain Medicine

## 2014-08-18 ENCOUNTER — Ambulatory Visit: Payer: Self-pay | Admitting: Pain Medicine

## 2014-08-31 ENCOUNTER — Ambulatory Visit: Payer: Self-pay | Admitting: Pain Medicine

## 2014-09-15 ENCOUNTER — Inpatient Hospital Stay: Payer: Self-pay | Admitting: Internal Medicine

## 2014-09-15 LAB — CBC
HCT: 38.4 % (ref 35.0–47.0)
HGB: 12.7 g/dL (ref 12.0–16.0)
MCH: 30.6 pg (ref 26.0–34.0)
MCHC: 33 g/dL (ref 32.0–36.0)
MCV: 93 fL (ref 80–100)
Platelet: 269 10*3/uL (ref 150–440)
RBC: 4.14 10*6/uL (ref 3.80–5.20)
RDW: 13 % (ref 11.5–14.5)
WBC: 9.9 10*3/uL (ref 3.6–11.0)

## 2014-09-15 LAB — COMPREHENSIVE METABOLIC PANEL
ALBUMIN: 4.1 g/dL (ref 3.4–5.0)
ALK PHOS: 126 U/L — AB
ALT: 27 U/L
Anion Gap: 5 — ABNORMAL LOW (ref 7–16)
BILIRUBIN TOTAL: 0.5 mg/dL (ref 0.2–1.0)
BUN: 10 mg/dL (ref 7–18)
CALCIUM: 9.6 mg/dL (ref 8.5–10.1)
CHLORIDE: 101 mmol/L (ref 98–107)
CREATININE: 0.95 mg/dL (ref 0.60–1.30)
Co2: 32 mmol/L (ref 21–32)
EGFR (African American): >60
EGFR (Non-African Amer.): >60
Glucose: 210 mg/dL — ABNORMAL HIGH (ref 65–99)
Osmolality: 281 (ref 275–301)
POTASSIUM: 4.2 mmol/L (ref 3.5–5.1)
SGOT(AST): 27 U/L (ref 15–37)
Sodium: 138 mmol/L (ref 136–145)
Total Protein: 8.3 g/dL — ABNORMAL HIGH (ref 6.4–8.2)

## 2014-09-15 LAB — URINALYSIS, COMPLETE
BILIRUBIN, UR: NEGATIVE
BLOOD: NEGATIVE
Bacteria: NONE SEEN
Glucose,UR: 50 mg/dL (ref 0–75)
Ketone: NEGATIVE
Leukocyte Esterase: NEGATIVE
Nitrite: NEGATIVE
PH: 6 (ref 4.5–8.0)
PROTEIN: NEGATIVE
RBC,UR: NONE SEEN /HPF (ref 0–5)
Specific Gravity: 1.008 (ref 1.003–1.030)
Squamous Epithelial: <1
WBC UR: NONE SEEN /HPF (ref 0–5)

## 2014-09-15 LAB — PROTIME-INR
INR: 1
PROTHROMBIN TIME: 13.4 s (ref 11.5–14.7)

## 2014-09-15 LAB — LIPASE, BLOOD: LIPASE: 83 U/L (ref 73–393)

## 2014-09-15 LAB — HEMOGLOBIN A1C: Hemoglobin A1C: 8 % — ABNORMAL HIGH (ref 4.2–6.3)

## 2014-09-15 LAB — TROPONIN I: Troponin-I: <0.02 ng/mL

## 2014-09-15 LAB — HEMOGLOBIN: HGB: 12.5 g/dL (ref 12.0–16.0)

## 2014-09-16 LAB — CBC WITH DIFFERENTIAL/PLATELET
BASOS ABS: 0.1 10*3/uL (ref 0.0–0.1)
Basophil %: 0.8 %
EOS ABS: 0.1 10*3/uL (ref 0.0–0.7)
EOS PCT: 0.5 %
HCT: 35.2 % (ref 35.0–47.0)
HGB: 11.3 g/dL — ABNORMAL LOW (ref 12.0–16.0)
LYMPHS ABS: 1 10*3/uL (ref 1.0–3.6)
LYMPHS PCT: 9 %
MCH: 29.8 pg (ref 26.0–34.0)
MCHC: 32 g/dL (ref 32.0–36.0)
MCV: 93 fL (ref 80–100)
Monocyte #: 0.7 x10 3/mm (ref 0.2–0.9)
Monocyte %: 6.4 %
Neutrophil #: 9.2 10*3/uL — ABNORMAL HIGH (ref 1.4–6.5)
Neutrophil %: 83.3 %
Platelet: 248 10*3/uL (ref 150–440)
RBC: 3.77 10*6/uL — AB (ref 3.80–5.20)
RDW: 12.8 % (ref 11.5–14.5)
WBC: 11 10*3/uL (ref 3.6–11.0)

## 2014-09-16 LAB — COMPREHENSIVE METABOLIC PANEL
ALK PHOS: 102 U/L
ANION GAP: 6 — AB (ref 7–16)
Albumin: 3.5 g/dL (ref 3.4–5.0)
BUN: 8 mg/dL (ref 7–18)
Bilirubin,Total: 0.7 mg/dL (ref 0.2–1.0)
CHLORIDE: 101 mmol/L (ref 98–107)
Calcium, Total: 8.4 mg/dL — ABNORMAL LOW (ref 8.5–10.1)
Co2: 31 mmol/L (ref 21–32)
Creatinine: 0.95 mg/dL (ref 0.60–1.30)
EGFR (Non-African Amer.): >60
Glucose: 161 mg/dL — ABNORMAL HIGH (ref 65–99)
Osmolality: 277 (ref 275–301)
POTASSIUM: 3.8 mmol/L (ref 3.5–5.1)
SGOT(AST): 14 U/L — ABNORMAL LOW (ref 15–37)
SGPT (ALT): 20 U/L
Sodium: 138 mmol/L (ref 136–145)
Total Protein: 6.5 g/dL (ref 6.4–8.2)

## 2014-09-16 LAB — CLOSTRIDIUM DIFFICILE(ARMC)

## 2014-09-17 LAB — HEMOGLOBIN
HGB: 11.5 g/dL — ABNORMAL LOW (ref 12.0–16.0)
HGB: 9.9 g/dL — ABNORMAL LOW (ref 12.0–16.0)

## 2014-09-18 LAB — STOOL CULTURE

## 2014-09-18 LAB — HEMOGLOBIN
HGB: 9.6 g/dL — ABNORMAL LOW (ref 12.0–16.0)
HGB: 9.9 g/dL — ABNORMAL LOW (ref 12.0–16.0)

## 2014-09-19 LAB — HEMOGLOBIN: HGB: 9.7 g/dL — ABNORMAL LOW (ref 12.0–16.0)

## 2014-09-20 LAB — CBC WITH DIFFERENTIAL/PLATELET
BASOS ABS: 0 10*3/uL (ref 0.0–0.1)
Basophil %: 0.3 %
EOS ABS: 0 10*3/uL (ref 0.0–0.7)
Eosinophil %: 0.2 %
HCT: 34.4 % — ABNORMAL LOW (ref 35.0–47.0)
HGB: 11.1 g/dL — ABNORMAL LOW (ref 12.0–16.0)
LYMPHS ABS: 0.3 10*3/uL — AB (ref 1.0–3.6)
Lymphocyte %: 4.2 %
MCH: 30 pg (ref 26.0–34.0)
MCHC: 32.3 g/dL (ref 32.0–36.0)
MCV: 93 fL (ref 80–100)
MONOS PCT: 1.3 %
Monocyte #: 0.1 x10 3/mm — ABNORMAL LOW (ref 0.2–0.9)
NEUTROS ABS: 7.2 10*3/uL — AB (ref 1.4–6.5)
Neutrophil %: 94 %
PLATELETS: 233 10*3/uL (ref 150–440)
RBC: 3.7 10*6/uL — ABNORMAL LOW (ref 3.80–5.20)
RDW: 12.8 % (ref 11.5–14.5)
WBC: 7.7 10*3/uL (ref 3.6–11.0)

## 2014-09-20 LAB — COMPREHENSIVE METABOLIC PANEL
ALT: 27 U/L
AST: 41 U/L — AB (ref 15–37)
Albumin: 3.1 g/dL — ABNORMAL LOW (ref 3.4–5.0)
Alkaline Phosphatase: 90 U/L
Anion Gap: 6 — ABNORMAL LOW (ref 7–16)
BUN: 4 mg/dL — AB (ref 7–18)
Bilirubin,Total: 0.3 mg/dL (ref 0.2–1.0)
CHLORIDE: 104 mmol/L (ref 98–107)
Calcium, Total: 8.4 mg/dL — ABNORMAL LOW (ref 8.5–10.1)
Co2: 28 mmol/L (ref 21–32)
Creatinine: 0.92 mg/dL (ref 0.60–1.30)
EGFR (African American): >60
Glucose: 202 mg/dL — ABNORMAL HIGH (ref 65–99)
Osmolality: 278 (ref 275–301)
Potassium: 4.4 mmol/L (ref 3.5–5.1)
Sodium: 138 mmol/L (ref 136–145)
Total Protein: 6.6 g/dL (ref 6.4–8.2)

## 2014-09-22 LAB — TROPONIN I: TROPONIN-I: 0.02 ng/mL

## 2014-09-23 ENCOUNTER — Encounter: Payer: Self-pay | Admitting: Cardiovascular Disease

## 2014-09-23 DIAGNOSIS — I4891 Unspecified atrial fibrillation: Secondary | ICD-10-CM

## 2014-09-23 DIAGNOSIS — K529 Noninfective gastroenteritis and colitis, unspecified: Secondary | ICD-10-CM

## 2014-09-23 DIAGNOSIS — D649 Anemia, unspecified: Secondary | ICD-10-CM

## 2014-09-23 LAB — BASIC METABOLIC PANEL
ANION GAP: 7 (ref 7–16)
Anion Gap: 5 — ABNORMAL LOW (ref 7–16)
BUN: 11 mg/dL (ref 7–18)
BUN: 11 mg/dL (ref 7–18)
CHLORIDE: 104 mmol/L (ref 98–107)
Calcium, Total: 8.3 mg/dL — ABNORMAL LOW (ref 8.5–10.1)
Calcium, Total: 8.4 mg/dL — ABNORMAL LOW (ref 8.5–10.1)
Chloride: 103 mmol/L (ref 98–107)
Co2: 31 mmol/L (ref 21–32)
Co2: 31 mmol/L (ref 21–32)
Creatinine: 1.31 mg/dL — ABNORMAL HIGH (ref 0.60–1.30)
Creatinine: 1.15 mg/dL (ref 0.60–1.30)
EGFR (African American): 59 — ABNORMAL LOW
EGFR (Non-African Amer.): 42 — ABNORMAL LOW
EGFR (Non-African Amer.): 49 — ABNORMAL LOW
GFR CALC AF AMER: 51 — AB
Glucose: 95 mg/dL (ref 65–99)
Glucose: 99 mg/dL (ref 65–99)
OSMOLALITY: 282 (ref 275–301)
OSMOLALITY: 277 (ref 275–301)
Potassium: 3.9 mmol/L (ref 3.5–5.1)
Potassium: 3.5 mmol/L (ref 3.5–5.1)
Sodium: 142 mmol/L (ref 136–145)
Sodium: 139 mmol/L (ref 136–145)

## 2014-09-23 LAB — CBC WITH DIFFERENTIAL/PLATELET
BASOS PCT: 0.9 %
Basophil #: 0.1 10*3/uL (ref 0.0–0.1)
EOS ABS: 0.1 10*3/uL (ref 0.0–0.7)
Eosinophil %: 1.3 %
HCT: 34.5 % — ABNORMAL LOW (ref 35.0–47.0)
HGB: 10.9 g/dL — AB (ref 12.0–16.0)
LYMPHS PCT: 20.4 %
Lymphocyte #: 1.7 10*3/uL (ref 1.0–3.6)
MCH: 29.8 pg (ref 26.0–34.0)
MCHC: 31.7 g/dL — AB (ref 32.0–36.0)
MCV: 94 fL (ref 80–100)
Monocyte #: 1 x10 3/mm — ABNORMAL HIGH (ref 0.2–0.9)
Monocyte %: 12.7 %
Neutrophil #: 5.3 10*3/uL (ref 1.4–6.5)
Neutrophil %: 64.7 %
Platelet: 296 10*3/uL (ref 150–440)
RBC: 3.67 10*6/uL — AB (ref 3.80–5.20)
RDW: 12.8 % (ref 11.5–14.5)
WBC: 8.2 10*3/uL (ref 3.6–11.0)

## 2014-09-23 LAB — TROPONIN I
TROPONIN-I: 0.05 ng/mL
Troponin-I: 0.05 ng/mL

## 2014-09-24 LAB — BASIC METABOLIC PANEL
Anion Gap: 5 — ABNORMAL LOW (ref 7–16)
BUN: 14 mg/dL (ref 7–18)
CALCIUM: 7.9 mg/dL — AB (ref 8.5–10.1)
Chloride: 104 mmol/L (ref 98–107)
Co2: 31 mmol/L (ref 21–32)
Creatinine: 1.3 mg/dL (ref 0.60–1.30)
EGFR (African American): 51 — ABNORMAL LOW
GFR CALC NON AF AMER: 42 — AB
GLUCOSE: 162 mg/dL — AB (ref 65–99)
Osmolality: 283 (ref 275–301)
Potassium: 4.1 mmol/L (ref 3.5–5.1)
SODIUM: 140 mmol/L (ref 136–145)

## 2014-09-24 LAB — CBC WITH DIFFERENTIAL/PLATELET
Basophil #: 0 10*3/uL (ref 0.0–0.1)
Basophil %: 0.5 %
EOS ABS: 0.2 10*3/uL (ref 0.0–0.7)
EOS PCT: 2 %
HCT: 33.6 % — ABNORMAL LOW (ref 35.0–47.0)
HGB: 10.5 g/dL — ABNORMAL LOW (ref 12.0–16.0)
LYMPHS ABS: 2.2 10*3/uL (ref 1.0–3.6)
Lymphocyte %: 26.2 %
MCH: 29.6 pg (ref 26.0–34.0)
MCHC: 31.2 g/dL — AB (ref 32.0–36.0)
MCV: 95 fL (ref 80–100)
MONOS PCT: 10.1 %
Monocyte #: 0.9 x10 3/mm (ref 0.2–0.9)
NEUTROS ABS: 5.2 10*3/uL (ref 1.4–6.5)
NEUTROS PCT: 61.2 %
Platelet: 298 10*3/uL (ref 150–440)
RBC: 3.55 10*6/uL — ABNORMAL LOW (ref 3.80–5.20)
RDW: 13.2 % (ref 11.5–14.5)
WBC: 8.5 10*3/uL (ref 3.6–11.0)

## 2014-09-24 LAB — PATHOLOGY REPORT

## 2014-09-28 ENCOUNTER — Ambulatory Visit (INDEPENDENT_AMBULATORY_CARE_PROVIDER_SITE_OTHER): Payer: Medicare Other | Admitting: Physician Assistant

## 2014-09-28 ENCOUNTER — Encounter: Payer: Self-pay | Admitting: *Deleted

## 2014-09-28 VITALS — BP 110/78 | HR 62 | Ht 65.0 in | Wt 151.5 lb

## 2014-09-28 DIAGNOSIS — IMO0001 Reserved for inherently not codable concepts without codable children: Secondary | ICD-10-CM

## 2014-09-28 DIAGNOSIS — R0602 Shortness of breath: Secondary | ICD-10-CM

## 2014-09-28 DIAGNOSIS — J449 Chronic obstructive pulmonary disease, unspecified: Secondary | ICD-10-CM

## 2014-09-28 DIAGNOSIS — I480 Paroxysmal atrial fibrillation: Secondary | ICD-10-CM

## 2014-09-28 DIAGNOSIS — K559 Vascular disorder of intestine, unspecified: Secondary | ICD-10-CM

## 2014-09-28 DIAGNOSIS — R197 Diarrhea, unspecified: Secondary | ICD-10-CM

## 2014-09-28 NOTE — Patient Instructions (Addendum)
No medication changes were made.  Please follow up with Dorris Fetchandice Jones, NP at [facility] ( office) 09/29/14 @ 10:00am [address], [id number] [phone]  Please call us if you have new issues that need to be addressed before your next appt.  Your physician wants you to follow-up in: 3 months with Dr. Billey CoGollan  You will receive a reminder letter in the mail two months in advance. If you don't receive a letter, please call our office to schedule the follow-up appointment.

## 2014-09-28 NOTE — Progress Notes (Signed)
Patient Name: Yvette Collier, Klosinski 13-Oct-1941, MRN [id number]  Date of Encounter: 09/28/2014  Primary Care Provider:  Hyman Hopes, MD Primary Cardiologist:  Dr. Mariah Milling, MD  Patient Profile:  73 y.o. female with history of IBS, history of colon polyps, COPD, history of lumbar as well as lower extremity pain who follows with Dr. Metta Clines, hypertension, diabetes, who presents to the hospital with complaints of rectal bleeding as well as nausea, vomiting and diarrhea who is here for hospital follow up after her recent admission to [facility] from 9/24-10/3 for diarrhea, segmental colitis c/w ischemic colitis who developed atrial fibrillation for a short time during her admission starting 10/1- had a second short episode later that evening, then remained in NSR through discharge.   Problem List:   Past Medical History  Diagnosis Date  . GERD (gastroesophageal reflux disease)   . Asthma   . Diabetes mellitus without complication   . Heart murmur   . IBS (irritable bowel syndrome)   . History of colon polyps   . Hypertension   . Osteoarthritis   . Chronic lower back pain   . Ischemic colitis   . Gastrointestinal bleed   . COPD (chronic obstructive pulmonary disease)   . Transient cerebral ischemia due to atrial fibrillation   . CAD (coronary artery disease)    Past Surgical History  Procedure Laterality Date  . Foot surgery Right   . Stomach surgery    . Abdominal hysterectomy    . Hemorrhoid banding    . Total hip arthroplasty    . Back surgery    . Cholecystectomy    . Appendectomy    . Tonsillectomy       Allergies:  Allergies  Allergen Reactions  . Effexor [Venlafaxine] Hives  . Ivp Dye [Iodinated Diagnostic Agents] Hives  . Sulfa Antibiotics Rash     HPI:  73 y.o. female with the above problem list who is here for hospital follow up after her recent hospital admission to [facility] from 9/24-10/3 for diarrhea, segmental colitis c/w ischemic colitis who developed a 2  short runs of a-fib on the evening of 10/1, then went back into NSR.   She is without any known prior cardiac prior to the above.   She presented to [facility] on 9/24 with nausea, vomiting and diarrhea. Also with significant pain in her abdomen diffusely, lower abdomen, as well as the left lower quadrant. The pain was unbearable, 10/10 intensity, pain alleviated by defecation. She was noted to have bright red-looking stool, as well as some vomiting with bright red blood. On arrival to the hospital, her blood pressure was good, as well as her hemoglobin level; however, she looked very dehydrated. She has had significant GI workup this admission, recent colonoscopy showing ischemic sigmoid region vs infection. CT abdomen appears to to show segmental colitis c/w ischemic colitis, though perhaps a small vessel disease and not an urgent surgical issue per GI.   On 10/1 she acutely developed tachycardia, severe SOB. She was noted to be in atrial fib with RVR. She did convert on her own. Medication changes in the hospital included changing her amlodipine to diltiazem. Echo showed EF 60-65%, normal global left ventricular systolic function, impaired relaxation pattern of LV diastolic filling, normal right ventricular size and systolic function, normal RVSP. It was felt that her a-fib was 2/2 Suspect secondary to multiple issues: ABD pain, distress, IVF and cardiac stretch. Electrolytes were ok. She was to be discharged on diltiazem 120 mg -  however she shows up today for follow up on Toprol-XL 50 mg in NSR.   She comes stating her diarrhea has returned as of this morning, having 4-5 watery stools. No BRBPR or melena. No hematemesis. She is slowly trying to push fluids. She is tolerating her metoprolol without issues. No chest pain, palpitations, SOB, DOE. She does not currently have GI follow up scheduled. She has follow up scheduled with her PCP for tomorrow at 12:30 PM.     Home Medications:  Prior to Admission  medications   Medication Sig Start Date End Date Taking? Authorizing Provider  ciprofloxacin (CIPRO) 500 MG tablet Take 500 mg by mouth 2 (two) times daily.   Yes Historical Provider, MD  dicyclomine (BENTYL) 20 MG tablet Take 20 mg by mouth 2 (two) times daily.   Yes Historical Provider, MD  metoprolol succinate (TOPROL-XL) 50 MG 24 hr tablet Take 50 mg by mouth daily. Take with or immediately following a meal.   Yes Historical Provider, MD  metroNIDAZOLE (FLAGYL) 500 MG tablet Take 500 mg by mouth every 8 (eight) hours.   Yes Historical Provider, MD  oxyCODONE (OXY IR/ROXICODONE) 5 MG immediate release tablet Take 5 mg by mouth every 8 (eight) hours as needed.  07/20/13  Yes Historical Provider, MD  simvastatin (ZOCOR) 20 MG tablet Take 1 tablet by mouth daily. 06/21/13  Yes Historical Provider, MD     Weights: Filed Weights   09/28/14 1400  Weight: 151 lb 8 oz (68.72 kg)     Review of Systems:  All other systems reviewed and are otherwise negative except as noted above.  Physical Exam:  Blood pressure 110/78, pulse 62, height 5\' 5"  (1.651 m), weight 151 lb 8 oz (68.72 kg).  General: Pleasant, NAD Psych: Normal affect. Neuro: Alert and oriented X 3. Moves all extremities spontaneously. HEENT: Normal  Neck: Supple without bruits or JVD. Lungs:  Resp regular and unlabored, CTA. Heart: RRR no s3, s4, or murmurs. Abdomen: Soft, tender LLQ, non-distended, BS + x 4.  Extremities: No clubbing, cyanosis or edema. DP/PT/Radials 2+ and equal bilaterally.   Accessory Clinical Findings:  EKG - NSR, 62, low voltage precordial leads, poor R wave progression,    Assessment & Plan:  1. History of PAF: -Currently in NSR with rate of 62 -Continue metoprolol 50 mg daily  -Echo normal in [facility], EF 60-65%, Normal global left ventricular systolic function, Impaired relaxation pattern of LV diastolic filling, Normal right ventricular size and systolic function, Normal RVSP  2. History of  segmental colitis c/w ischemic colitis: -Redeveloped diarrhea this AM, no GI follow up was made at patient's [facility] -Called Dr. Annabell SabalWohl's office and made appointment for patient to be seen 10/8 at 10 AM -She will by on clear liquid diet only until that appointment   -Per GI  3. HLD: -Continue simvastatin 20 mg   4. History of COPD -May limit usage long term of amiodarone in the future if needed  5. Dispo: -Follow up with Dr. Mariah MillingGollan, MD in 3 months, sooner if needed   Eula Listenyan Elenor Wildes, PA-C [facility] [address] [place], [place] [address] [phone] [facility] 09/28/2014, 2:27 PM

## 2014-09-30 ENCOUNTER — Ambulatory Visit: Payer: Self-pay | Admitting: Urgent Care

## 2014-09-30 LAB — CLOSTRIDIUM DIFFICILE(ARMC)

## 2014-10-13 ENCOUNTER — Telehealth: Payer: Self-pay | Admitting: Cardiovascular Disease

## 2014-10-13 NOTE — Telephone Encounter (Signed)
Question about meds..  1. She thinks dr started pt on Cardizem and appears he stopped the metoprolol but pt is getting both meds.  Her heart rate was 56 but she has a breift rapid rates here and there. Does he recommend she stop metoprolol  2. Her creatinine is mildly improved to 1.01. She would like to re start the lisinopril given the diabetes.  Her bp was 126/59 of of it. Should we wait to start lisinopril or do it  3. She has completed amitotics for ischemic colitis would he feel its better to restart Asprin until a few weeks out.

## 2014-10-13 NOTE — Telephone Encounter (Signed)
1) I recommend an office visit for medication titration. Sounds like she is going in and out of a-fin with RVR.  2) She can restart the lisinopril now and I can recheck a BMET when she follows up next week.  3) She should ask the GI MD when they are comfortable with her restarting aspirin. She can restart it then.

## 2014-10-13 NOTE — Telephone Encounter (Signed)
Question about meds..  °1. She thinks dr started pt on Cardizem and appears he stopped the metoprolol but pt is getting both meds.  °Her heart rate was 56 but she has a breift rapid rates here and there. Does he recommend she stop metoprolol  °2. Her creatinine is mildly improved to 1.01. She would like to re start the lisinopril given the diabetes.  °Her bp was 126/59 of of it. Should we wait to start lisinopril or do it  °3. She has completed amitotics for ischemic colitis would he feel its better to restart Asprin until a few weeks out.  ° °

## 2014-10-18 NOTE — Telephone Encounter (Signed)
Spoke w/ pt.  Advised her of Ryan's recommendation.  She is agreeable, but reports that her meds come prepackaged weekly from the pharmacy and she does not have any lisinopril at this time.  Pt sched to see Dr. Mariah MillingGollan on Thursday this week, as she is having dizzy spells.

## 2014-10-20 ENCOUNTER — Ambulatory Visit (INDEPENDENT_AMBULATORY_CARE_PROVIDER_SITE_OTHER): Payer: Medicare Other | Admitting: Cardiovascular Disease

## 2014-10-20 ENCOUNTER — Telehealth: Payer: Self-pay

## 2014-10-20 ENCOUNTER — Encounter: Payer: Self-pay | Admitting: Cardiovascular Disease

## 2014-10-20 VITALS — BP 130/82 | HR 59 | Ht 65.0 in | Wt 157.0 lb

## 2014-10-20 DIAGNOSIS — J439 Emphysema, unspecified: Secondary | ICD-10-CM

## 2014-10-20 DIAGNOSIS — E785 Hyperlipidemia, unspecified: Secondary | ICD-10-CM | POA: Insufficient documentation

## 2014-10-20 DIAGNOSIS — K559 Vascular disorder of intestine, unspecified: Secondary | ICD-10-CM | POA: Insufficient documentation

## 2014-10-20 DIAGNOSIS — E118 Type 2 diabetes mellitus with unspecified complications: Secondary | ICD-10-CM | POA: Insufficient documentation

## 2014-10-20 DIAGNOSIS — I480 Paroxysmal atrial fibrillation: Secondary | ICD-10-CM | POA: Insufficient documentation

## 2014-10-20 DIAGNOSIS — J449 Chronic obstructive pulmonary disease, unspecified: Secondary | ICD-10-CM | POA: Insufficient documentation

## 2014-10-20 DIAGNOSIS — K219 Gastro-esophageal reflux disease without esophagitis: Secondary | ICD-10-CM | POA: Insufficient documentation

## 2014-10-20 DIAGNOSIS — G629 Polyneuropathy, unspecified: Secondary | ICD-10-CM | POA: Insufficient documentation

## 2014-10-20 DIAGNOSIS — R531 Weakness: Secondary | ICD-10-CM

## 2014-10-20 MED ORDER — METOPROLOL TARTRATE 25 MG PO TABS: 25.0000 mg | ORAL_TABLET | Freq: Two times a day (BID) | ORAL | Status: DC | PRN

## 2014-10-20 NOTE — Telephone Encounter (Signed)
Dr. Lawerance BachBurns nurse called, states she called last week with some questions from Dr. Lawerance BachBurns, states Dr. Lawerance BachBurns would like to talk with Dr. Mariah MillingGollan regarding these questions. Dr. Lawerance BachBurns cell # [phone].

## 2014-10-20 NOTE — Assessment & Plan Note (Signed)
She reports that she was recently started on Lyrica. She feels that she is having a side effect with lightheadedness and dizziness.

## 2014-10-20 NOTE — Assessment & Plan Note (Signed)
We have encouraged continued exercise, careful diet management in an effort to lose weight. 

## 2014-10-20 NOTE — Assessment & Plan Note (Signed)
Recommended that she take H2 blockers as needed in addition to her PPI for breakthrough symptoms

## 2014-10-20 NOTE — Telephone Encounter (Signed)
Pt sched to see you today @ 11:15.

## 2014-10-20 NOTE — Assessment & Plan Note (Signed)
Encouraged her to stay on her simvastatin 

## 2014-10-20 NOTE — Assessment & Plan Note (Signed)
Followed by Dr. Servando SnareWohl. She reports periodic diarrhea, epigastric discomfort. Also with GERD

## 2014-10-20 NOTE — Patient Instructions (Addendum)
Your next appointment will be scheduled in our new office located at :  [facility]  [address], [address]  [place], [place] [address]  You are doing well. Please take extra metoprolol as needed for atrial fibrillation  Please call us if you have new issues that need to be addressed before your next appt.  Your physician wants you to follow-up in: 3 months.  You will receive a reminder letter in the mail two months in advance. If you don't receive a letter, please call our office to schedule the follow-up appointment.

## 2014-10-20 NOTE — Progress Notes (Signed)
Patient ID: Yvette RakersMary L Collier, female    DOB: [birth year], 73 y.o.   MRN: [id number]  HPI Comments: 73 y.o. female with a history of significant GI issues including ischemic colitis, recent hospital admission 09/15/2014 for GI complications, noted to have atrial fibrillation. She presents for routine followup  [facility] admission [facility]24-10/3 for diarrhea, segmental colitis c/w ischemic colitis who developed a 2 short runs of a-fib on the evening of 10/1, then went back into NSR.   It was felt that her a-fib was 2/2 Suspect secondary to multiple issues: ABD pain, distress, IVF and cardiac stretch. Electrolytes were ok.  On her last clinic visit, she was in normal sinus rhythm.  On today's visit, she reports that she is doing well with no symptoms of tachycardia or palpitations. She feels that the Lyrica is causing her to be dizzy and feel out of it. Blood pressure has been well-controlled. No recent significant GI discomfort. She has to be careful what she eats. She does have periodic diarrhea  Previous CT abdomen showed  segmental colitis c/w ischemic colitis, though perhaps a small vessel disease and not an urgent surgical issue per GI.   On 10/1 she acutely developed tachycardia, severe SOB. She was noted to be in atrial fib with RVR. She did convert on her own. Medication changes in the hospital included changing her amlodipine to diltiazem.   Echo showed EF 60-65%, normal global left ventricular systolic function, impaired relaxation pattern of LV diastolic filling, normal right ventricular size and systolic function, normal RVSP.  EKG today shows sinus rhythm with rate 59 beats per minute, no significant ST or T wave changes   Outpatient Encounter Prescriptions as of 10/20/2014  Medication Sig  . aspirin 81 MG tablet Take 81 mg by mouth daily.  Marland Kitchen. DEXILANT 60 MG capsule Take 60 mg by mouth daily.   Marland Kitchen. dicyclomine (BENTYL) 20 MG tablet Take 20 mg by mouth 2 (two) times daily.  Marland Kitchen.  diltiazem (CARDIZEM CD) 120 MG 24 hr capsule Take 120 mg by mouth daily.   . isosorbide mononitrate (IMDUR) 30 MG 24 hr tablet Take 30 mg by mouth daily.   Marland Kitchen. LYRICA 25 MG capsule Take 25 mg by mouth daily.   . metoprolol succinate (TOPROL-XL) 50 MG 24 hr tablet Take 50 mg by mouth daily. Take with or immediately following a meal.  . oxyCODONE (OXY IR/ROXICODONE) 5 MG immediate release tablet Take 5 mg by mouth every 8 (eight) hours as needed.   . simvastatin (ZOCOR) 20 MG tablet Take 1 tablet by mouth daily.  . metoprolol tartrate (LOPRESSOR) 25 MG tablet Take 1 tablet (25 mg total) by mouth 2 (two) times daily as needed. For atrial fibrillation    Review of Systems  Constitutional: Negative.   HENT: Negative.   Eyes: Negative.   Respiratory: Negative.   Cardiovascular: Negative.   Gastrointestinal: Positive for abdominal pain and diarrhea.  Endocrine: Negative.   Musculoskeletal: Negative.   Skin: Negative.   Allergic/Immunologic: Negative.   Neurological: Negative.   Hematological: Negative.   Psychiatric/Behavioral: Negative.   All other systems reviewed and are negative.   BP 130/82  Pulse 59  Ht 5\' 5"  (1.651 m)  Wt 157 lb (71.215 kg)  BMI 26.13 kg/m2  Physical Exam  Nursing note and vitals reviewed. Constitutional: She is oriented to person, place, and time. She appears well-developed and well-nourished.  HENT:  Head: Normocephalic.  Nose: Nose normal.  Mouth/Throat: Oropharynx is clear and  moist.  Eyes: Conjunctivae are normal. Pupils are equal, round, and reactive to light.  Neck: Normal range of motion. Neck supple. No JVD present.  Cardiovascular: Normal rate, regular rhythm, S1 normal, S2 normal, normal heart sounds and intact distal pulses.  Exam reveals no gallop and no friction rub.   No murmur heard. Pulmonary/Chest: Effort normal and breath sounds normal. No respiratory distress. She has no wheezes. She has no rales. She exhibits no tenderness.  Abdominal:  Soft. Bowel sounds are normal. She exhibits no distension. There is no tenderness.  Musculoskeletal: Normal range of motion. She exhibits no edema and no tenderness.  Lymphadenopathy:    She has no cervical adenopathy.  Neurological: She is alert and oriented to person, place, and time. Coordination normal.  Skin: Skin is warm and dry. No rash noted. No erythema.  Psychiatric: She has a normal mood and affect. Her behavior is normal. Judgment and thought content normal.    Assessment and Plan

## 2014-10-20 NOTE — Assessment & Plan Note (Signed)
Suggested she continue on her current medications. For breakthrough arrhythmia, suggested she take metoprolol tartrate. Prescription was provided to her today

## 2014-10-24 ENCOUNTER — Encounter: Payer: Self-pay | Admitting: Cardiovascular Disease

## 2014-10-27 ENCOUNTER — Telehealth: Payer: Self-pay

## 2014-10-27 NOTE — Telephone Encounter (Signed)
Spoke w/ pt.  Advised her to continue asa, as this was on her med list at last ov.  She reports that she resumed this am. Asked her to call back w/ any questions or concerns.

## 2014-10-27 NOTE — Telephone Encounter (Signed)
Pt has a question regarding her aspirin. Pt states she was taken off of it when she was in the hospital , last month.. Dr. Lawerance BachBurns states it was ok for her to start Lisinopril, but not sure about the aspirin. Please call.

## 2014-11-04 DIAGNOSIS — I2510 Atherosclerotic heart disease of native coronary artery without angina pectoris: Secondary | ICD-10-CM | POA: Insufficient documentation

## 2014-11-07 ENCOUNTER — Ambulatory Visit: Payer: Self-pay | Admitting: Pain Medicine

## 2014-11-14 ENCOUNTER — Ambulatory Visit: Payer: Self-pay | Admitting: Pain Medicine

## 2014-12-13 ENCOUNTER — Ambulatory Visit: Payer: Self-pay | Admitting: Pain Medicine

## 2015-01-03 ENCOUNTER — Encounter (INDEPENDENT_AMBULATORY_CARE_PROVIDER_SITE_OTHER): Payer: Self-pay

## 2015-01-03 ENCOUNTER — Ambulatory Visit (INDEPENDENT_AMBULATORY_CARE_PROVIDER_SITE_OTHER): Payer: Medicare Other | Admitting: Cardiovascular Disease

## 2015-01-03 ENCOUNTER — Encounter: Payer: Self-pay | Admitting: Cardiovascular Disease

## 2015-01-03 VITALS — BP 130/72 | HR 53 | Ht 65.0 in | Wt 155.2 lb

## 2015-01-03 DIAGNOSIS — E118 Type 2 diabetes mellitus with unspecified complications: Secondary | ICD-10-CM

## 2015-01-03 DIAGNOSIS — I480 Paroxysmal atrial fibrillation: Secondary | ICD-10-CM

## 2015-01-03 DIAGNOSIS — R002 Palpitations: Secondary | ICD-10-CM

## 2015-01-03 DIAGNOSIS — I10 Essential (primary) hypertension: Secondary | ICD-10-CM

## 2015-01-03 DIAGNOSIS — E785 Hyperlipidemia, unspecified: Secondary | ICD-10-CM

## 2015-01-03 DIAGNOSIS — J439 Emphysema, unspecified: Secondary | ICD-10-CM

## 2015-01-03 DIAGNOSIS — K559 Vascular disorder of intestine, unspecified: Secondary | ICD-10-CM

## 2015-01-03 NOTE — Assessment & Plan Note (Signed)
Symptoms are stable, she denies significant shortness of breath

## 2015-01-03 NOTE — Patient Instructions (Signed)
You are doing well. No medication changes were made.  Ok to hold the isosorbide  Please call us if you have new issues that need to be addressed before your next appt.  Your physician wants you to follow-up in: 6 months.  You will receive a reminder letter in the mail two months in advance. If you don't receive a letter, please call our office to schedule the follow-up appointment.

## 2015-01-03 NOTE — Progress Notes (Signed)
Patient ID: Yvette Collier, female    DOB: 06/09/1941, 74 y.o.   MRN: [id number]  HPI Comments: 74 y.o. female with a history of significant GI issues including ischemic colitis, recent hospital admission 09/15/2014 for GI complications, noted to have atrial fibrillation 09/22/14. She presents for routine followup of her atrial fibrillation She has a [facility] showing minimal coronary artery disease, history of anemia, back surgery 3 lasting 2004, left hip replacement 2012  [facility]10[facility]3 for diarrhea, segmental colitis c/w ischemic colitis who developed a 2 short runs of a-fib on the evening of 10/1, then went back into NSR.   It was felt that her a-fib was 2/2 Suspect secondary to multiple issues: ABD pain, distress, IVF and cardiac stretch. Electrolytes were ok.   On today's visit, she reports that she is doing well with no symptoms of tachycardia or palpitations. She is taking metoprolol succinate 50 mg in the morning, extra metoprolol tartrate 25 mg at dinner on a regular basis. On this regimen, she feels better with less palpitations She reports having one episode of a "jolt"feeling since her last clinic visit. This happened at nighttime. She reports having more of these episodes in the past, less on the metoprolol tartrate at night She brings her blood pressure medications with her today. She is not taking isosorbide mononitrate. She is unclear where this dropped off her list  No recent significant GI discomfort. She has to be careful what she eats. She does have periodic diarrhea She takes potassium daily  EKG on today's visit shows sinus bradycardia with rate 53 bpm, no significant ST or T-wave changes   Other past medical history  Previous CT abdomen showed  segmental colitis c/w ischemic colitis, though perhaps a small vessel disease and not an urgent surgical issue per GI.   Echo showed EF 60-65%, normal global left  ventricular systolic function, impaired relaxation pattern of LV diastolic filling, normal right ventricular size and systolic function, normal RVSP.  Allergies  Allergen Reactions  . Effexor [Venlafaxine] Hives  . Ivp Dye [Iodinated Diagnostic Agents] Hives  . Sulfa Antibiotics Rash    Outpatient Encounter Prescriptions as of 01/03/2015  Medication Sig  . aspirin 81 MG tablet Take 81 mg by mouth daily.  Marland Kitchen. DEXILANT 60 MG capsule Take 60 mg by mouth daily.   Marland Kitchen. dicyclomine (BENTYL) 20 MG tablet Take 20 mg by mouth 2 (two) times daily.  Marland Kitchen. diltiazem (CARDIZEM CD) 120 MG 24 hr capsule Take 120 mg by mouth daily.   . isosorbide mononitrate (IMDUR) 30 MG 24 hr tablet Take 30 mg by mouth daily.   Marland Kitchen. lisinopril (PRINIVIL,ZESTRIL) 2.5 MG tablet Take 2.5 mg by mouth daily.   Marland Kitchen. LYRICA 25 MG capsule Take 25 mg by mouth daily.   . metoprolol succinate (TOPROL-XL) 50 MG 24 hr tablet Take 50 mg by mouth daily. Take with or immediately following a meal.  . metoprolol tartrate (LOPRESSOR) 25 MG tablet Take 1 tablet (25 mg total) by mouth 2 (two) times daily as needed. For atrial fibrillation  . oxyCODONE (OXY IR/ROXICODONE) 5 MG immediate release tablet Take 5 mg by mouth every 8 (eight) hours as needed.   . simvastatin (ZOCOR) 20 MG tablet Take 1 tablet by mouth daily.    Past Medical History  Diagnosis Date  . GERD (gastroesophageal reflux disease)   . Asthma   . Diabetes mellitus without complication   . Heart murmur   .  IBS (irritable bowel syndrome)   . History of colon polyps   . Hypertension   . Osteoarthritis   . Chronic lower back pain   . Ischemic colitis   . Gastrointestinal bleed   . COPD (chronic obstructive pulmonary disease)   . Transient cerebral ischemia due to atrial fibrillation   . CAD (coronary artery disease)   . PAF (paroxysmal atrial fibrillation)     a. 09/2014    Past Surgical History  Procedure Laterality Date  . Foot surgery Right   . Stomach surgery    .  Abdominal hysterectomy    . Hemorrhoid banding    . Total hip arthroplasty    . Back surgery    . Cholecystectomy    . Appendectomy    . Tonsillectomy      Social History  reports that she has quit smoking. She has never used smokeless tobacco. She reports that she does not drink alcohol or use illicit drugs.  Family History family history includes Heart attack in her mother; Heart disease in her father; Hypertension in her brother, father, maternal aunt, and mother.   Review of Systems  Constitutional: Negative.   HENT: Negative.   Respiratory: Negative.   Cardiovascular: Negative.   Gastrointestinal: Positive for abdominal pain and diarrhea.  Musculoskeletal: Negative.   Skin: Negative.   Neurological: Negative.   Hematological: Negative.   Psychiatric/Behavioral: Negative.   All other systems reviewed and are negative.   BP 130/72 mmHg  Pulse 53  Ht  (1.651 m)  Wt 155 lb 4 oz (70.421 kg)  BMI 25.83 kg/m2  Physical Exam  Constitutional: She is oriented to person, place, and time. She appears well-developed and well-nourished.  HENT:  Head: Normocephalic.  Nose: Nose normal.  Mouth/Throat: Oropharynx is clear and moist.  Eyes: Conjunctivae are normal. Pupils are equal, round, and reactive to light.  Neck: Normal range of motion. Neck supple. No JVD present.  Cardiovascular: Normal rate, regular rhythm, S1 normal, S2 normal, normal heart sounds and intact distal pulses.  Exam reveals no gallop and no friction rub.   No murmur heard. Pulmonary/Chest: Effort normal and breath sounds normal. No respiratory distress. She has no wheezes. She has no rales. She exhibits no tenderness.  Abdominal: Soft. Bowel sounds are normal. She exhibits no distension. There is no tenderness.  Musculoskeletal: Normal range of motion. She exhibits no edema or tenderness.  Lymphadenopathy:    She has no cervical adenopathy.  Neurological: She is alert and oriented to person, place,  and time. Coordination normal.  Skin: Skin is warm and dry. No rash noted. No erythema.  Psychiatric: She has a normal mood and affect. Her behavior is normal. Judgment and thought content normal.    Assessment and Plan  Nursing note and vitals reviewed.

## 2015-01-03 NOTE — Assessment & Plan Note (Signed)
Encouraged her to stay on her simvastatin 

## 2015-01-03 NOTE — Assessment & Plan Note (Signed)
Suggested she stay on her metoprolol succinate in the morning, metoprolol tartrate at night. She is taking the latter on a regular basis with improvement of her symptoms. Unable to exclude paroxysmal atrial fibrillation  but she reports she is feeling well, no significant symptoms on this regimen We did offer a 30 day monitor for her "jolt" feeling that she had previously but she has not had this recently. Recommended redo the monitor if she starts having more of that feeling as that could be a conversion pause from atrial fibrillation to normal sinus rhythm.

## 2015-01-03 NOTE — Assessment & Plan Note (Signed)
Diet controlled, currently not on any medications

## 2015-01-03 NOTE — Assessment & Plan Note (Signed)
Periodic loose bowel movements, improved on her current medications. She is on potassium supplement, recent potassium 4.6

## 2015-01-03 NOTE — Assessment & Plan Note (Signed)
She is currently not taking isosorbide. This seemed to fall of her last some time back. She denies any chest pain symptoms concerning for angina. Blood pressure relatively well-controlled. We will stop the isosorbide for now

## 2015-01-17 ENCOUNTER — Ambulatory Visit: Payer: Self-pay | Admitting: Pain Medicine

## 2015-01-20 ENCOUNTER — Ambulatory Visit: Payer: Medicare Other | Admitting: Cardiovascular Disease

## 2015-01-25 ENCOUNTER — Ambulatory Visit: Payer: Self-pay | Admitting: Pain Medicine

## 2015-02-08 ENCOUNTER — Ambulatory Visit: Payer: Self-pay | Admitting: Pain Medicine

## 2015-03-07 ENCOUNTER — Ambulatory Visit: Payer: Self-pay | Admitting: Pain Medicine

## 2015-04-03 ENCOUNTER — Ambulatory Visit
Admit: 2015-04-03 | Disposition: A | Discharge status: Home / Self Care | Payer: Self-pay | Attending: Pain Medicine | Admitting: Pain Medicine

## 2015-04-12 ENCOUNTER — Ambulatory Visit
Admit: 2015-04-12 | Disposition: A | Discharge status: Home / Self Care | Payer: Self-pay | Attending: Pain Medicine | Admitting: Pain Medicine

## 2015-04-15 NOTE — Discharge Summary (Signed)
PATIENT NAME:  Yvette Collier, Yvette Collier MR#:  [id number] DATE OF BIRTH:  [birth year]  DATE OF ADMISSION:  09/15/2014 DATE OF DISCHARGE:  09/24/2014  DISCHARGE DIAGNOSES:     1.  Ischemic colitis.  2.  Hematemesis/gastrointestinal bleed, acute blood loss in nature.  3.  Dehydration.  4.  Chronic obstructive pulmonary disease. 5.  Weakness.   6.  Transient atrial fibrillation.   SECONDARY DIAGNOSES:  1.  History of colon polyps.  2.  IBS.  3.  Hypertension.  4.  Diabetes.  5.  Coronary artery disease.  6.  Transient atrial fibrillation.   CONSULTATIONS:  1.  Cardiology, Dr. Julien Nordmann.  2.  Surgery,  Dr. Marshia Ly.  3.  Gastroenterology, Dr. Lynnae Prude.  4.  Vascular surgery, Dr. Levora Dredge.   PROCEDURES AND RADIOLOGY:  1.  Esophagogastroduodenoscopy by Dr. Midge Minium on 09/16/2014 showed hiatal hernia, benign appearing esophageal stricture, likely a Mallory-Weiss tear which has healed.  2.  Colonoscopy by Dr. Midge Minium on 09/22/2014 showing ischemic colitis around the sigmoid colon area.  3.  A 2-D echocardiogram on 09/23/2014 showed an LVEF of 60% to 65%, impaired relaxation of LV diastolic filling.  4.  CT scan of the abdomen and pelvis on 09/15/2014 showed possible colitis and small hiatal hernia.  5.  Abdominal x-ray on 09/15/2014 showed no evidence of acute abnormality.   6.  CT scan of the abdomen and pelvis on 09/20/2014 showed improving signs of colitis. The majority of the descending colon is improved.  7.  Chest x-ray on 09/23/2014 showed cardiomegaly without acute cardiopulmonary disease. There is no evidence of edema.   DIAGNOSTIC DATA:  1.  Urinalysis on admission was negative.  2.  Stool culture was negative for Clostridium difficile and other pathogenic bacteria.  3.  Colon biopsy did not show any pathological changes.   HISTORY AND SHORT HOSPITAL COURSE: The patient is a 74 year old female with the above-mentioned medical problems, who was admitted for  acute gastroenteritis symptoms along with GI bleed.  Acute blood loss in nature: This was concerning for acute colitis. Please see Dr. Arlys John dictated history and physical for further details. The patient was admitted for abdominal pain, nausea, vomiting and diarrhea, along with hematochezia and hematemesis. GI consultation was obtained with Dr. Molly Maduro Elliott/Dr. Midge Minium who recommended upper endoscopy which was performed on 09/16/2014 showing possible healed lesion of Mallory-Weiss tear, and benign-appearing esophageal stricture. The patient was evaluated by surgery, Dr. Marshia Ly, due to ongoing concern for colitis, not improving; he felt this to be possible ischemic colitis, for which vascular surgery consultation was obtained with Dr. Levora Dredge who recommended repeating the CT scan with contrast for better evaluation. This was performed on 09/20/2014 showing overall improvement in her colitis, but the patient's clinical symptoms were not much better, for which she underwent further evaluation by colonoscopy by Dr. Midge Minium on 09/22/2014, which showed signs of ischemic colitis around the sigmoid colon area. On the evening on 09/22/2014, post colonoscopy, the patient went into rapid atrial fibrillation with tachypnea, tachycardia, along with chest pain and shortness of breath, which required her to be transferred to the intensive care unit. Cardiology consultation was obtained with Dr. Julien Nordmann who recommended echocardiogram, which was performed. The patient was already converted back to normal sinus rhythm and was feeling a whole lot better. She was transferred back to the floor and has been doing well for last 48 hours with not much symptoms, and she was tolerating  a diet, for which she is being discharged to home with home health services. On the date of discharge, her vital signs were as follows: Temperature 98.5, heart rate 62 per minute, respirations 18 per minute, blood pressure  126/78. She is saturating 98% on room air.   DISCHARGE MEDICATIONS:  1.  Dexilant 60 mg p.o. daily.  2.  Vitamin B complex once daily.  3.  Vitamin D3 5000 international units once daily.  4.  Vitamin E 400 international units once daily.  5.  Simvastatin 20 mg p.o. at bedtime.  6.  Lyrica 25 mg p.o. daily as tolerated.  7.  Metronidazole 500 mg p.o. every 8 hours for 4 days.  8.  Oxycodone 5 mg p.o. every 8 hours as needed for moderate pain.  9.  Cardizem CD 120 mg p.o. daily.  10.  Ciprofloxacin 500 mg p.o. b.i.d. for 4 more days.   DISCHARGE DIET: Low sodium.   DISCHARGE ACTIVITY: As tolerated.   DISCHARGE INSTRUCTIONS AND FOLLOWUP: The patient was instructed to follow up with her primary care physician, Dr. Fulton MoleHarriett Burns in 1-2 weeks. She will need followup with cardiology, Dr. Julien Nordmannimothy Gollan, in 1-2 weeks. She was set up to get home health, physical therapy, nurse, nursing care and social worker,   TOTAL TIME DISCHARGING THIS PATIENT: 55 minutes.    ____________________________ Ellamae SiaVipul S. Sherryll BurgerShah, MD vss:MT D: 09/24/2014 12:41:37 ET T: 09/24/2014 13:12:04 ET JOB#: [id number]  cc: Edman Lipsey S. Sherryll BurgerShah, MD, <Dictator> Harriett Pietro CassisP. Burns, MD Antonieta Ibaimothy J. Gollan, MD Quentin Orealph L. Ely III, MD Midge Miniumarren Wohl, MD Renford DillsGregory G. Schnier, MD Ellamae SiaVIPUL S [facility] MD ELECTRONICALLY SIGNED 09/27/2014 15:48

## 2015-04-15 NOTE — Consult Note (Signed)
Chief Complaint:  Subjective/Chief Complaint Pt denies any further hematemesis or vomiting.  No further diarrhea or bleeding.  Continued generalized abd pain 4/10.   VITAL SIGNS/ANCILLARY NOTES: **Vital Signs.:   02-Oct-15 08:21  Vital Signs Type Routine  Temperature Temperature (F) 99.1  Celsius 37.2  Temperature Source oral  Pulse Pulse 70  Pulse source if not from Vital Sign Device per cardiac monitor  Respirations Respirations 13  Systolic BP Systolic BP 726  Diastolic BP (mmHg) Diastolic BP (mmHg) 61  Mean BP 76  BP Source  if not from Vital Sign Device non-invasive  Pulse Ox % Pulse Ox % 100  Pulse Ox Activity Level  At rest  Oxygen Delivery 2L; Nasal Cannula  Pulse Ox Heart Rate 71  Telemetry pattern Cardiac Rhythm Normal sinus rhythm    20:35  Systolic BP Systolic BP 597  Diastolic BP (mmHg) Diastolic BP (mmHg) 70  Mean BP 84  BP Source  if not from Vital Sign Device non-invasive   Brief Assessment:  GEN well developed, well nourished, no acute distress, A/Ox3, daughter at bedside   Cardiac Regular   Respiratory normal resp effort   Gastrointestinal Normal   Gastrointestinal details normal Soft  Nondistended  Bowel sounds normal  No rebound tenderness  No gaurding  No rigidity  +mild TTP entire abdomen   EXTR negative cyanosis/clubbing, negative edema   Additional Physical Exam Skin: warm, dry, no jaundice   Lab Results: Routine Chem:  02-Oct-15 08:25   Glucose, Serum 99  BUN 11  Creatinine (comp) 1.15  Sodium, Serum 139  Potassium, Serum 3.5  Chloride, Serum 103  CO2, Serum 31  Calcium (Total), Serum  8.4  Anion Gap  5  Osmolality (calc) 277  eGFR (African American)  59  eGFR (Non-African American)  49 (eGFR values <55m/min/1.73 m2 may be an indication of chronic kidney disease (CKD). Calculated eGFR, using the MRDR Study equation, is useful in  patients with stable renal function. The eGFR calculation will not be reliable in acutely ill  patients when serum creatinine is changing rapidly. It is not useful in patients on dialysis. The eGFR calculation may not be applicable to patients at the low and high extremes of body sizes, pregnant women, and vetetarians.)  Routine Hem:  02-Oct-15 08:25   WBC (CBC) 8.2  RBC (CBC)  3.67  Hemoglobin (CBC)  10.9  Hematocrit (CBC)  34.5  Platelet Count (CBC) 296  MCV 94  MCH 29.8  MCHC  31.7  RDW 12.8  Neutrophil % 64.7  Lymphocyte % 20.4  Monocyte % 12.7  Eosinophil % 1.3  Basophil % 0.9  Neutrophil # 5.3  Lymphocyte # 1.7  Monocyte #  1.0  Eosinophil # 0.1  Basophil # 0.1 (Result(s) reported on 23 Sep 2014 at 08:41AM.)   Assessment/Plan:  Assessment/Plan:  Assessment Acute ischemic colitis:  Slow to improve with residual findings at splenic flexure on colonoscopy.  Possible low flow state from multiple anti-hypertensives.  Watch for signs of chronic ischemia & stenosis over time.  Appreciate cardiology input.  CTA without significant stenosis. Celiac, SMA, IMA.   Hematemesis:  Resolved.  EGD benign.   Plan 1) complete empiric Cipro and Flagyl for 10 days total 2) Supportive measures Please call if you have any questions or concerns Dr WAllen Norriswill be covering over weekend.   Electronic Signatures: JAndria Meuse(NP)  (Signed 02-Oct-15 12:00)  Authored: Chief Complaint, VITAL SIGNS/ANCILLARY NOTES, Brief Assessment, Lab Results, Assessment/Plan   Last Updated:  02-Oct-15 12:00 by Andria Meuse (NP)

## 2015-04-15 NOTE — Consult Note (Signed)
Chief Complaint:  Subjective/Chief Complaint Pt denies any further hematemesis or vomiting.  She reports 4 more loose stools with minimal blood in past 24 hrs.  Continued generalized abd pain 4/10.  Multiple family members & friends at bedside.   VITAL SIGNS/ANCILLARY NOTES: **Vital Signs.:   30-Sep-15 07:38  Vital Signs Type Q 8hr  Temperature Temperature (F) 98.8  Celsius 37.1  Temperature Source oral  Respirations Respirations 17  Systolic BP Systolic BP 952  Diastolic BP (mmHg) Diastolic BP (mmHg) 68  Mean BP 91  Pulse Ox % Pulse Ox % 98  Pulse Ox Activity Level  At rest  Oxygen Delivery Room Air/ 21 %   Brief Assessment:  GEN well developed, well nourished, no acute distress, A/Ox3   Cardiac Regular   Respiratory normal resp effort   Gastrointestinal Normal   Gastrointestinal details normal Soft  Nondistended  Bowel sounds normal  No rebound tenderness  No gaurding  No rigidity  +mild TTP entire abdomen   EXTR negative cyanosis/clubbing, negative edema   Additional Physical Exam Skin: warm, dry, no jaundice   Lab Results: Hepatic:  29-Sep-15 05:03   Bilirubin, Total 0.3  Alkaline Phosphatase 90 (46-116 NOTE: New Reference Range 07/12/14)  SGPT (ALT) 27 (14-63 NOTE: New Reference Range 07/12/14)  SGOT (AST)  41  Total Protein, Serum 6.6  Albumin, Serum  3.1  Routine Chem:  29-Sep-15 05:03   Glucose, Serum  202  BUN  4  Creatinine (comp) 0.92  Sodium, Serum 138  Potassium, Serum 4.4  Chloride, Serum 104  Calcium (Total), Serum  8.4  Osmolality (calc) 278  eGFR (African American) >60  eGFR (Non-African American) >60 (eGFR values <16m/min/1.73 m2 may be an indication of chronic kidney disease (CKD). Calculated eGFR, using the MRDR Study equation, is useful in  patients with stable renal function. The eGFR calculation will not be reliable in acutely ill patients when serum creatinine is changing rapidly. It is not useful in patients on dialysis. The  eGFR calculation may not be applicable to patients at the low and high extremes of body sizes, pregnant women, and vetetarians.)  Anion Gap  6  Routine Hem:  29-Sep-15 05:03   WBC (CBC) 7.7  RBC (CBC)  3.70  Hemoglobin (CBC)  11.1  Hematocrit (CBC)  34.4  Platelet Count (CBC) 233  MCV 93  MCH 30.0  MCHC 32.3  RDW 12.8  Neutrophil % 94.0  Lymphocyte % 4.2  Monocyte % 1.3  Eosinophil % 0.2  Basophil % 0.3  Neutrophil #  7.2  Lymphocyte #  0.3  Monocyte #  0.1  Eosinophil # 0.0  Basophil # 0.0 (Result(s) reported on 20 Sep 2014 at 0[facility], [place])   Radiology Results: CT:    29-Sep-15 13:55, CT Angiography Abdomen/Pelvis W/WO Contrast  CT Angiography Abdomen/Pelvis W/WO Contrast   REASON FOR EXAM:    persistent abd pain concerning for ischemic colitis  COMMENTS:       PROCEDURE: CT  - CT ANGIOGRAPHY ABD/PEL W/WO  - Sep 20 2014  1:55PM     CLINICAL DATA:  Rectal bleeding and vomiting for 1 week. Pelvic pain  for 1 week.    EXAM:  CTA ABDOMEN AND PELVIS WITHOUT CONTRAST    TECHNIQUE:  Multidetector CT imaging of the abdomen and pelvis was performed  using the standard protocol during bolus administration of  intravenous contrast. Multiplanar reconstructed images and MIPs were  obtained and reviewed to evaluate the vascular anatomy.    CONTRAST:  100  cc Isovue 350 (patient with history of contrast  allergy though received appropriate preprocedural steroid prep and  tolerated intravenous contrast administration without incident).    COMPARISON:  CT abdomen pelvis - 09/15/2014; 06/14/2013    FINDINGS:  Vascular Findings:    Abdominal aorta: Scattered minimal amount of mixed calcified and  noncalcified atherosclerotic plaque within a normal caliber  abdominal aorta. No abdominal aortic dissection or periaortic  stranding.  Celiac artery: There is a minimal amount of eccentric calcified  plaque involving the cranial aspect of the origin the celiac artery,  not resulting  in hemodynamically significant stenosis. Conventional  branching pattern.    SMA: Widely patent without hemodynamically significant stenosis.  Conventional branching pattern.    Right Renal artery: Solitary ; there is a minimal amount of  eccentric calcified plaque involving the caudal proximal aspect of  the right renal artery, not resulting in a hemodynamically  significant stenosis.    Left Renal artery: Solitary ; there is a minimal amount of eccentric  calcified plaque involving the proximal aspect of the left renal  artery, not resulting in hemodynamically significant stenosis.    IMA: Remains patent.    Pelvic vasculature: The bilateral common and external iliac arteries  are normal caliber and widely patent without hemodynamically  significant narrowing. The bilateral internal iliacarteries are  minimally disease though of normal caliber and widely patent without  hemodynamically significant narrowing.    Review of the MIP images confirms the above findings.      ---------------------------------------------------------------------------  -----  Nonvascular Findings:    The previously noted abnormal bowel wall thickening involving distal  aspect of the transverse colon as well as the majority of the  descending colon has largely improved in the interval with minimal  amountof residual bowel wall thickening within the splenic flexure  (representative axial image 78, series 4; coronal image 36, series  8). No enteric obstruction. No pneumoperitoneum, pneumatosis or  portal venous gas. Small hiatal hernia. The appendix isnot  visualized, however there is no inflammatory change within the right  lower abdominal quadrant.    No retroperitoneal, mesenteric, pelvic or inguinal lymphadenopathy.    Post hysterectomy. Normal appearance of the urinary bladder given  degree of distention.    Normal hepatic contour. Post cholecystectomy with mild dilatation of  the common  bile duct measuring approximately 1 cm in greatest  oblique coronal dimension (coronal image 56, series 8 and mild  centralized intrahepatic biliary ductdilatation, presumably the  sequela of biliary reservoir phenomenon. No discrete hepatic  lesions. No ascites.    There is symmetric enhancement of the bilateral kidneys. Right-sided  sub cm hypo attenuating renal lesions are too small likely  characterize of favored to represent renal cysts. No urinary  obstruction or perinephric stranding. Normal appearance of the  bilateral adrenal glands, pancreas and spleen.  Punctate (approximately 3 mm) nodule within the lingula (image 1,  series 12) is unchanged since the 05/2013 examination. There is  minimal subsegmental atelectasis within the dependent portion of the  right lower lobe. No focal airspace opacities. No pleural effusion.    Normal heart size.  No pericardial effusion.    No acute or aggressive osseus abnormalities. Post L3-L5 paraspinal  fusion and intervertebral disc space replacement with apparent bony  incorporation. There is mild to moderate multilevel DDD throughout  the remainder of the imaged thoracic and lumbar spine. Dystrophic  calcifications within the caudal aspect of the left breast.  Postsurgical change of the midline of  the lower back as well as the  ventral lower abdominal/ pelvic wall.  Post left total hip replacement.     IMPRESSION:  1. Previously noted segmental colonic wall thickening involving the  distal aspect of the transverse colon as well as the majority the  descending colon has improved and nearly resolved in the interval  with very minimal amount of residual colonic wall thickening at the  level of the splenic flexure - findings suggestive of improved  infection and/or inflammation. No evidence of enteric obstruction.  No pneumatosis or portal venous gas.  2. Scattered minimal amount of atherosclerotic plaque within a  normal caliber abdominal  aorta. No abdominal aortic dissection or  evidence mesenteric ischemia.  3. Punctate (approximately 3 mm) nodule with the lingula is  unchanged since the 05/2013 examination. Follow-up examination in  05/2015 would ensure 2 years of stability.      Electronically Signed    By: Sandi Mariscal M.D.    On: 09/20/2014 14:35         Verified By: Aileen Fass, M.D.,   Assessment/Plan:  Assessment/Plan:  Assessment Acute ischemic colitis:  Slow to improve, pt still symptomatic.  CTA without significant stenosis. Celiac, SMA, IMA.  Also shows resolving colitis with persistent changes at splenic flexure.  Vascular & surgical input appreciated.  Colonoscopy with Dr Allen Norris to further investigate source of colitis.  Discussed risks/benefits of procedure which include but are not limited to bleeding, infection, perforation & drug reaction.  Patient agrees with this plan & consent will be obtained. Hematemesis:  Resolved.  EGD benign.   Plan 1) continue empiric Cipro and Flagyl 2) Colonoscopy with Dr Allen Norris tomorrow 3) Standard golytely prep but may DC once clear & NPO after MN 4) continue supportive measures including fluids, pain control and antiemetics Please call if you have any questions or concerns   Electronic Signatures: Andria Meuse (NP)  (Signed 30-Sep-15 12:51)  Authored: Chief Complaint, VITAL SIGNS/ANCILLARY NOTES, Brief Assessment, Lab Results, Radiology Results, Assessment/Plan   Last Updated: 30-Sep-15 12:51 by Andria Meuse (NP)

## 2015-04-15 NOTE — Consult Note (Signed)
Chief Complaint:  Subjective/Chief Complaint Pt denies any further hematemesis or vomiting.  She did have another dark bloody stool.  Generalized abd pain 8/10.   VITAL SIGNS/ANCILLARY NOTES: **Vital Signs.:   28-Sep-15 07:58  Vital Signs Type Q 8hr  Temperature Temperature (F) 98.5  Celsius 36.9  Temperature Source oral  Pulse Pulse 64  Respirations Respirations 18  Systolic BP Systolic BP 124  Diastolic BP (mmHg) Diastolic BP (mmHg) 78  Mean BP 93  Pulse Ox % Pulse Ox % 100  Pulse Ox Activity Level  At rest  Oxygen Delivery Room Air/ 21 %   Brief Assessment:  GEN well developed, well nourished, no acute distress, A/Ox3, daughter at bedside   Cardiac Regular   Respiratory normal resp effort   Gastrointestinal Normal   Gastrointestinal details normal Soft  Nondistended  Bowel sounds normal  No rebound tenderness  No gaurding  No rigidity  +mild TTP entire abdomen   EXTR negative cyanosis/clubbing, negative edema   Additional Physical Exam Skin: warm, dry, no jaundice   Lab Results: Routine Hem:  28-Sep-15 05:41   Hemoglobin (CBC)  9.7 (Result(s) reported on 19 Sep 2014 at 06:36AM.)   Assessment/Plan:  Assessment/Plan:  Assessment Acute ischemic colitis:  Slow to improve.  Hx IBS.  Colonoscopy up to date.   Vascular surgery consult. Hematemesis:  Resolved.   Plan 1) Continue b.i.d. Protonix 40 mg 2) continue empiric Cipro and Flagyl 3) continue supportive measures including fluids, pain control and antiemetics.  4) vascular surgery consult Please call if you have any questions or concerns   Electronic Signatures: Joselyn ArrowJones, Yuka Lallier L (NP)  (Signed 28-Sep-15 12:36)  Authored: Chief Complaint, VITAL SIGNS/ANCILLARY NOTES, Brief Assessment, Lab Results, Assessment/Plan   Last Updated: 28-Sep-15 12:36 by Joselyn ArrowJones, Airyana Sprunger L (NP)

## 2015-04-15 NOTE — H&P (Signed)
PATIENT NAME:  Yvette Collier, Yvette Collier MR#:  [id number] DATE OF BIRTH:  October 04, 1941  DATE OF ADMISSION:  09/15/2014  PRIMARY CARE PHYSICIAN: Hyman HopesHarriett P. Burns, M.D.   HISTORY OF PRESENT ILLNESS: The patient is a 74 year old African American female with past medical history significant for history of IBS, history of colon polyps, history of lumbar as well as lower extremity pain who follows with Dr. Metta Clinesrisp, hypertension, diabetes, and multiple medical problems, who presents to the hospital with complaints of rectal bleeding as well as nausea, vomiting and diarrhea. Apparently, the patient started having nausea, vomiting and diarrhea yesterday. After a few minutes, she would need to go to the bathroom. She would have significant pain in her abdomen diffusely, and it was mostly in the lower abdomen, as well as the left lower quadrant. The pain would become unbearable, 10/10 intensity, and was also constant with sometimes worsening episodes, especially whenever she needs to go to the bathroom, and alleviated by defecation. She was noted to have bright red-looking stool, as well as some vomiting with bright red blood. On arrival to the hospital, her blood pressure was good, as well as her hemoglobin level; however, she looks very dehydrated, and the hospitalist services were contacted for admission.   PAST MEDICAL HISTORY: Significant for history of colon polyps, history of IBS, history of lumbar as well as lower extremity pain for which she has been followed by Dr. Metta Clinesrisp. History hypertension, diabetes mellitus off diabetic medications since lost weight, history of cardiac murmur, history of coronary artery disease followed [facility]. Last stress test was in 2012.   PAST SURGICAL HISTORY: History of 3 back surgeries, as well as left total hip replacement in 2012. Also she had gallbladder surgery, appendectomy, tonsillectomy and hysterectomy.   ALLERGIES: EFFEXOR, CONTRAST DYE, AS WELL AS SULFA.    FAMILY HISTORY: Coronary artery disease in the patient's father who died at the age of 76. He also had asthma. Stroke in the patient's mother, who died at age of 76. Diabetes mellitus in multiple family members. No cancer in immediate family members.   SOCIAL HISTORY: The patient is divorced. She has 4 children who live close by. No smoking. Occasional alcohol use. She used to drink alcohol in the past, but nothing recently. She used to work in patient healthcare.   REVIEW OF SYSTEMS: Positive for feeling cold over the past night, feeling weak. Weight loss which is what she describes as intentional, approximately 30 pounds over the year. Some blurry vision, double vision also cataracts which needs to be operated, but not in the nearest immediate future. Some sinus congestion, difficulty swallowing intermittently. Also admits to having intermittent exertional chest pains, also dyspnea on exertion, as well as arrhythmia and feeling presyncopal earlier today. Nausea, vomiting, diarrhea, abdominal pain, hematemesis, as well as rectal bleeding since yesterday night, low part abdominal pains. Also easy to bruise. Otherwise, denies any high fevers, fatigue, weakness, or weight gain. Denies glaucoma, admits of cataracts, that need to be operated on, admits of  difficulty swallowing,  palpitations. Admits on nausea, vomiting, diarrhea denies any constipation, admits to change in bowel habits. No hematemesis  SKIN: Denies any acne, rash, lesions, or change in moles.  MUSCULOSKELETAL: Denies arthritis, cramps, swelling. NEUROLOGIC: deneis numbness, weakness, tremor.  PSYCHIATRIC: Denies anxiety, insomnia, or depression.   PHYSICAL EXAMINATION:  VITAL SIGNS: On arrival to the hospital, the patient's vital signs, temperature was 99.1, pulse was 77, respiratory rate was 17, blood pressure 127/79,  saturation was 100% on room air.  GENERAL: This is a well-developed, well-nourished African American female in moderate  distress due to significant pain lying on the stretcher.  HEENT: Her pupils are equal, reactive to light. Extraocular muscles intact, no icterus or conjunctivitis. Has normal hearing. No pharyngeal erythema. Mucosa is very dry.  NECK: No masses. Supple, nontender. Thyroid is not enlarged. No adenopathy. No JVD or carotid bruits bilaterally. Full range of motion.  LUNGS: Diminished breath sounds intermittently, a few rales, rhonchi, as well as intermittent wheezing was noted with no labored respirations, increased effort, dullness to percussion. Not in overt respiratory distress.  CARDIOVASCULAR: S1, S2 appreciated. Rhythm is regular, distant. PMI not lateralized. Chest is nontender to palpation, 1+ pedal pulses. No lower extremity edema, calf tenderness or cyanosis was noted.  ABDOMEN: Soft, however tender diffusely. Most of the discomfort seemed to be in the lower quadrants, especially the left lower quadrant. No hepatosplenomegaly or masses were noted. Bowel sounds are diminished. No hernia was noted.  MUSCULOSKELETAL: The patient's strength is 5/5 in all 4 extremities. No cyanosis, degenerative joint disease or kyphosis. Gait was not tested.  SKIN: Did not reveal any rashes, lesions, erythema, nodularity or induration. It was warm and dry to palpation.  LYMPHATIC: No adenopathy in the cervical region.  NEUROLOGIC: Cranial nerves grossly intact. Sensory is intact. No dysarthria or aphasia. The patient is alert, oriented to time, person and place, cooperative. Memory is somewhat a impaired, but she is not confused or agitated. She is somewhat restless and anxious.   LABORATORY DATA: EKG is pending.   The patient's laboratory data done in the Emergency Room, BMP showed glucose level of 210,  total protein was 8.3, alkaline phosphatase 126, otherwise liver enzymes were unremarkable. Troponin was less than 0.02. Pellecchia cell count was 9.9, hemoglobin was 12.7, platelet count 269,000.   Coagulation panel  unremarkable. Pro time 13.4, INR 1.0.   Urinalysis is straw and clear urine,  50 mg/dL glucose, negative for bilirubin or ketones. Specific gravity 1.008, 6.0 pH. Negative for blood, protein, nitrite, and leukocyte esterase. No red blood cells, no Hattery blood cells, no bacteria were seen, less than 1 epithelial cell.   X-rays, abdominal flat and erect x-ray on 09/15/2014 revealed no evidence of acute abnormality or obstruction.   ASSESSMENT AND PLAN: 1. Acute gastroenteritis of unclear etiology, questionable inflammatory, toxic, infectious. As the patient had a mild fever, we are going to initiate her on Cipro and Flagyl. We will also get stool cultures and we follow the patient clinically.  2. Gastrointestinal bleed. We will start the patient on a proton pump inhibitor IV. We will hold aspirin therapy. We will get the gastroenterologist involved for further recommendations. 3. Dehydration. We will continue IV fluids at a high rate. 4. Likely chronic obstructive pulmonary disease. We will start the patient on humibid as well as DuoNebs.   TIME SPENT: 50 minutes.     ____________________________ Katharina Caper, MD rv:JT D: 09/15/2014 10:22:43 ET T: 09/15/2014 11:57:54 ET JOB#: [id number]  cc: Katharina Caper, MD, <Dictator> Hyman Hopes, MD [place] Perra Winona Legato MD ELECTRONICALLY SIGNED 09/29/2014 18:42

## 2015-04-15 NOTE — Consult Note (Signed)
Chief Complaint:  Subjective/Chief Complaint Pt denies any further hematemesis or vomiting.  She did have 3 more another dark bloody stool.  Denies diarrhea.  Generalized abd pain 4/10.  Pt says she slept well.   VITAL SIGNS/ANCILLARY NOTES: **Vital Signs.:   29-Sep-15 08:12  Temperature Temperature (F) 99.3  Celsius 37.3  Temperature Source oral  Pulse Pulse 79  Respirations Respirations 17  Systolic BP Systolic BP 409  Diastolic BP (mmHg) Diastolic BP (mmHg) 86  Mean BP 105  Pulse Ox % Pulse Ox % 98  Pulse Ox Activity Level  At rest  Oxygen Delivery Room Air/ 21 %   Brief Assessment:  GEN well developed, well nourished, no acute distress, A/Ox3   Cardiac Regular   Respiratory normal resp effort   Gastrointestinal Normal   Gastrointestinal details normal Soft  Nondistended  Bowel sounds normal  No rebound tenderness  No gaurding  No rigidity  +mild TTP entire abdomen   EXTR negative cyanosis/clubbing, negative edema   Additional Physical Exam Skin: warm, dry, no jaundice   Lab Results: Hepatic:  29-Sep-15 05:03   Bilirubin, Total 0.3  Alkaline Phosphatase 90 (46-116 NOTE: New Reference Range 07/12/14)  SGPT (ALT) 27 (14-63 NOTE: New Reference Range 07/12/14)  SGOT (AST)  41  Total Protein, Serum 6.6  Albumin, Serum  3.1  Routine Chem:  29-Sep-15 05:03   Glucose, Serum  202  BUN  4  Creatinine (comp) 0.92  Sodium, Serum 138  Potassium, Serum 4.4  Chloride, Serum 104  CO2, Serum 28  Calcium (Total), Serum  8.4  Osmolality (calc) 278  eGFR (African American) >60  eGFR (Non-African American) >60 (eGFR values <41m/min/1.73 m2 may be an indication of chronic kidney disease (CKD). Calculated eGFR, using the MRDR Study equation, is useful in  patients with stable renal function. The eGFR calculation will not be reliable in acutely ill patients when serum creatinine is changing rapidly. It is not useful in patients on dialysis. The eGFR calculation may not  be applicable to patients at the low and high extremes of body sizes, pregnant women, and vetetarians.)  Anion Gap  6  Routine Hem:  29-Sep-15 05:03   WBC (CBC) 7.7  RBC (CBC)  3.70  Hemoglobin (CBC)  11.1  Hematocrit (CBC)  34.4  Platelet Count (CBC) 233  MCV 93  MCH 30.0  MCHC 32.3  RDW 12.8  Neutrophil % 94.0  Lymphocyte % 4.2  Monocyte % 1.3  Eosinophil % 0.2  Basophil % 0.3  Neutrophil #  7.2  Lymphocyte #  0.3  Monocyte #  0.1  Eosinophil # 0.0  Basophil # 0.0 (Result(s) reported on 20 Sep 2014 at 06:04AM.)   Assessment/Plan:  Assessment/Plan:  Assessment Acute ischemic colitis:  Slow to improve.  CTA today.  Hx IBS.   Vascular & surgical input appreciated.  If CTA benign, consider endoscopic evaluation. Hematemesis:  Resolved.   Plan 1) continue empiric Cipro and Flagyl 2) CTA 3) continue supportive measures including fluids, pain control and antiemetics Please call if you have any questions or concerns   Electronic Signatures: JAndria Meuse(NP)  (Signed 29-Sep-15 09:13)  Authored: Chief Complaint, VITAL SIGNS/ANCILLARY NOTES, Brief Assessment, Lab Results, Assessment/Plan   Last Updated: 29-Sep-15 09:13 by JAndria Meuse(NP)

## 2015-04-15 NOTE — Consult Note (Signed)
General Aspect 74 year old [place] American female with past medical history significant for history of IBS, history of colon polyps, COPD,  history of lumbar as well as lower extremity pain who follows with Dr. Primus Bravo, hypertension, diabetes, who presents to the hospital with complaints of rectal bleeding as well as nausea, vomiting and diarrhea. Cardiology was consulted for atrial fibrillation starting yesterday, converting to NSR at 9 pm after several hours, last night    nausea, vomiting and diarrhea on arrival this admission. Also with significant pain in her abdomen diffusely,  lower abdomen, as well as the left lower quadrant. The pain was unbearable, 10/10 intensity, pain alleviated by defecation. She was noted to have bright red-looking stool, as well as some vomiting with bright red blood.   On arrival to the hospital, her blood pressure was good, as well as her hemoglobin level; however, she looked very dehydrated She has had significant GI workup this admission, recent colonoscopy showing ischemic sigmoid region vs infection She has been on significant IVF through the coarse of her admission  Yesterday she acutely developed tachycardia, severe SOB. She was noted to be in atrial fib with RVR and was transferred to the CCU. In teh CCU, she converted to NSR   Physical Exam:  GEN well developed, well nourished, no acute distress   HEENT hearing intact to voice, moist oral mucosa   NECK supple   RESP normal resp effort  clear BS   CARD Regular rate and rhythm  No murmur   ABD denies tenderness  soft   LYMPH negative neck   EXTR negative edema   SKIN normal to palpation   NEURO motor/sensory function intact   PSYCH alert, A+O to time, place, person, good insight   Review of Systems:  Subjective/Chief Complaint SOB and tachycardia last night   General: Fatigue   Skin: No Complaints   ENT: No Complaints   Eyes: No Complaints   Neck: No Complaints   Respiratory:  Short of breath   Cardiovascular: Palpitations   Gastrointestinal: No Complaints   Genitourinary: No Complaints   Vascular: No Complaints   Musculoskeletal: No Complaints   Neurologic: No Complaints   Hematologic: No Complaints   Endocrine: No Complaints   Psychiatric: No Complaints   Review of Systems: All other systems were reviewed and found to be negative   Medications/Allergies Reviewed Medications/Allergies reviewed   Family & Social History:  Family and Social History:  Family History Negative   Social History negative tobacco   Place of Living Home     Hypercholesterolemia:    Heart Murmer:    Arthritis:    Back Pain, Chronic:    HTN:    Irritable Bowel Syndrome:    Diabetes Mellitus, Type II (NIDD):    Hip Replacement - Left:    Back Surgery:        Admit Diagnosis:   GASTROINTESTINAL BLEEDING.: Onset Date: 23-Sep-2014, Status: Active, Description: GASTROINTESTINAL BLEEDING.  Home Medications: Medication Instructions Status  Lyrica 25 mg oral capsule LIMIT  1  TAB PO / DAY IF TOLERATED Active  NORFLEX  100  MG    LIMIT  1  TAB PO / DAY OR BID IF TOLERATED [ NOTE ONLY TAKE FLEXERIL IF NEEDED AND IF TOLERATED] Active  oxycodone  5  mg  LIMIT  3 - 6  TABS PO / DAY IF TOLERATED Active  Dexilant 60 mg oral delayed release capsule 1 cap(s) orally once a day (in the morning) Active  vitamin  A 1 cap(s) orally once a day Active  Vitamin B Complex 100 1 tab(s) orally once a day Active  potassium citrate 1 tab(s) orally once a day Active  isosorbide mononitrate 30 mg oral tablet, extended release 1 tab(s) orally once a day (in the morning) Active  Vitamin D3 5000 intl units oral capsule 1 cap(s) orally once a day Active  vitamin E 400 intl units oral capsule 1 cap(s) orally once a day Active  Vitamin C 500 mg oral tablet 1 tab(s) orally once a day Active  aspirin 81 mg oral tablet 1 tab(s) orally once a day Active  cholestyramine 4 g/5 g oral powder  for reconstitution 17 gram(s) mixed with juice orally once a day Active  metpproolol 50 milligram(s) orally once a day Active  simvastatin 20 mg oral tablet 1 tab(s) orally once a day (at bedtime) Active  dicyclomine 20 mg oral tablet  orally bid Active  lisinopril 20 mg oral tablet  orally bid Active  amLODIPine 5 mg oral tablet 1 tab(s) orally once a day Active   Lab Results:  Routine Chem:  02-Oct-15 06:06   Glucose, Serum 95  BUN 11  Creatinine (comp)  1.31  Sodium, Serum 142  Potassium, Serum 3.9  Chloride, Serum 104  CO2, Serum 31  Calcium (Total), Serum  8.3  Anion Gap 7  Osmolality (calc) 282  eGFR (African American)  51  eGFR (Non-African American)  42 (eGFR values <57m/min/1.73 m2 may be an indication of chronic kidney disease (CKD). Calculated eGFR, using the MRDR Study equation, is useful in  patients with stable renal function. The eGFR calculation will not be reliable in acutely ill patients when serum creatinine is changing rapidly. It is not useful in patients on dialysis. The eGFR calculation may not be applicable to patients at the low and high extremes of body sizes, pregnant women, and vetetarians.)  Cardiac:  02-Oct-15 06:06   Troponin I 0.05 (0.00-0.05 0.05 ng/mL or less: NEGATIVE  Repeat testing in 3-6 hrs  if clinically indicated. >0.05 ng/mL: POTENTIAL  MYOCARDIAL INJURY. Repeat  testing in 3-6 hrs if  clinically indicated. NOTE: An increase or decrease  of 30% or more on serial  testing suggests a  clinically important change)   EKG:  Interpretation EKG showed NSR with no significant ST or T wave changes Tele strip overnight 8 pm showing atrial fibrillation with rate 140 bpm Tele reviewed in CCU, rate 140 until 20:00, then NSR at 70 bpm   Radiology Results: XRay:    02-Oct-15 11:22, Chest PA and Lateral  Chest PA and Lateral   REASON FOR EXAM:    chest pain  COMMENTS:       PROCEDURE: DXR - DXR CHEST PA (OR AP) AND LATERAL  - Sep 23 2014 11:22AM     CLINICAL DATA:  Chest pain, chest pain and tachycardia since last  evening. Initial encounter.    EXAM:  CHEST  2 VIEW    COMPARISON:  03/24/2012    FINDINGS:  The examination is degraded due to patient body habitus.  Grossly unchanged borderline enlarged cardiac silhouette and  mediastinal contours. Improved aeration of the lungs. No discrete  focal airspace opacities. No pleural effusion pneumothorax. No  evidence of edema. No acute osseus abnormalities.     IMPRESSION:  Cardiomegaly without acute cardiopulmonary disease. Specifically, no  evidence of edema.      Electronically Signed    By: JEldridge AbrahamsD.  On: 09/23/2014 11:29       Verified By: Aileen Fass, M.D.,  Cardiology:    02-Oct-15 10:34, Echo Doppler  Echo Doppler   REASON FOR EXAM:      COMMENTS:       PROCEDURE: [facility] - ECHO DOPPLER COMPLETE(TRANSTHOR)  - Sep 23 2014 10:34AM     RESULT: Echocardiogram Report    Patient Name:   Yvette Collier Hansen Date of Exam: 09/23/2014  Medical Rec #:  [id number]       Custom1:  Date of Birth:  Oct 04, 1941    Height:       65.0 in  Patient Age:    63 years     Weight:       158.0 lb  Patient Gender: F            BSA:          1.79 m??    Indications: Atrial Fib  Sonographer:    Janalee Dane RCS  Referring Phys: Ether Griffins, [place]    Summary:   1. Essentially a normal study   2. Left ventricular ejection fraction, by visual estimation, is 60 to   65%.   3. Normal global left ventricular systolic function.   4. Impaired relaxation pattern of LV diastolic filling.   5. Normal right ventricular size and systolic function.   6. Normal RVSP  2D AND M-MODE MEASUREMENTS (normal ranges within parentheses):  Left Ventricle:          Normal  IVSd (2D):      1.09 cm (0.7-1.1)  LVPWd (2D):     0.98 cm (0.7-1.1) Aorta/LA:                  Normal  LVIDd (2D):     4.05 cm (3.4-5.7) Aortic Root (2D): 2.70 cm (2.4-3.7)  LVIDs (2D):     1.94 cm           Left Atrium  (2D): 3.80 cm (1.9-4.0)  LV FS (2D):     52.1 %   (>25%)  LV EF (2D):     83.7 %   (>50%)                                    Right Ventricle:                                    RVd (2D):        8.58 cm  LV DIASTOLIC FUNCTION:  MV Peak E: 0.69 m/s E/e' Ratio: 6.70  MV Peak A: 0.95 m/s Decel Time: 380 msec  E/A Ratio: 0.72  SPECTRAL DOPPLER ANALYSIS (where applicable):  Mitral Valve:  MV P1/2 Time: 110.20 msec  MV Area, PHT: 2.00 cm??    PHYSICIAN INTERPRETATION:  Left Ventricle: The left ventricular internal cavity size was normal. LV   posterior wall thickness was normal. No left ventricular hypertrophy.   Global LV systolic function was normal. Left ventricular ejection   fraction, by visual estimation, is 60 to 65%. Spectral Doppler shows   impaired relaxation pattern of LV diastolic filling.  Right Ventricle: Normal right ventricular size, wall thickness, and   systolicfunction. The right ventricular size is normal. Global RV   systolic function is normal.  Left Atrium: The left atrium is normal in size.  Right Atrium: The  right atrium is normal in size.  Pericardium: There is no evidence of pericardial effusion.  Mitral Valve: The mitral valve is normal in structure. Trace mitral valve   regurgitation is seen.  Tricuspid Valve: The tricuspid valve is normal. Trivial tricuspid   regurgitation is visualized.  Aortic Valve: The aortic valve is normal. Mild to moderate aortic valve   sclerosis/calcification is present, without any evidence of aortic   stenosis.  Pulmonic Valve: The pulmonic valve is normal. Trace pulmonic valve   regurgitation.  Aorta: The aortic root and ascending aorta are structurally normal, with   no evidence of dilitation.    [id number] Ida Rogue MD  Electronically signed by [id number] Ida Rogue MD  Signature Date/Time: 09/23/2014/1:12:00 PM    *** Final ***    IMPRESSION: .    Verified By: Minna Merritts, M.D., MD    Effexor: Chest Pain,  Hives, Rash  Sulfa drugs: Unknown  Contrast dye: Unknown  Vital Signs/Nurse's Notes: **Vital Signs.:   02-Oct-15 08:21  Vital Signs Type Routine  Temperature Temperature (F) 99.1  Celsius 37.2  Temperature Source oral  Pulse Pulse 70  Pulse source if not from Vital Sign Device per cardiac monitor  Respirations Respirations 13  Systolic BP Systolic BP 256  Diastolic BP (mmHg) Diastolic BP (mmHg) 61  Mean BP 76  BP Source  if not from Vital Sign Device non-invasive  Pulse Ox % Pulse Ox % 100  Pulse Ox Activity Level  At rest  Oxygen Delivery 2L; Nasal Cannula  Pulse Ox Heart Rate 71  Telemetry pattern Cardiac Rhythm Normal sinus rhythm    Impression 73 f with IBS, HTN, DM here with abd pain, gi bleed  1) atrial fib last night, ending around 20:00, repeat short burst 21:17 Suspect secondary to multiple issues: ABD pain, distress, IVF and cardiac stretch. Will check electrolytes. (potassium low 3.5) ECHO pending to rule out cardiac pathology.  (essentially a normal study)  She did not tolerate heart rate in the 140s well, was very symptomatric.  --Will hold amlodipine, change to diltiazem 30 mg q6 with hold parameters --Will also place hold parameters on lisinopril and imdur. BP low this AM, possibly overmedicated.  --- will monitor rhythm. Would use amiodarone po or IV if she has recurrent atrial fib  2) Acute ischemic colitis On antibiotics.  Colonoscopy showing ischemic colitis around sigmoid colon area  3) Acute blood loss anemia due to GI bleed Follow serial Hemoglobin   4)  Hematemesis Likely from mallory weis tear. EGD normal. gi following  5)  Dehydration:  resolved  6)  COPD:  continue duonebs, humibid   Electronic Signatures: Ida Rogue (MD)  (Signed 02-Oct-15 18:44)  Authored: General Aspect/Present Illness, History and Physical Exam, Review of System, Family & Social History, Past Medical History, Health Issues, Home Medications, Labs, EKG ,  Radiology, Allergies, Vital Signs/Nurse's Notes, Impression/Plan   Last Updated: 02-Oct-15 18:44 by Ida Rogue (MD)

## 2015-04-15 NOTE — Consult Note (Signed)
General Aspect Colitis of the distal transverse and proximal left colon   Present Illness The patient is a 74 year old female with past medical history significant for inflamatory bowel disease who presented to the hospital with complaints of rectal bleeding as well as nausea, vomiting and diarrhea.  The patient reports she started having nausea, vomiting and diarrhea a day or two before admission. She would have significant pain in her abdomen diffusely,  but more concentrated in the lower abdomen and left lower quadrant. The pain was very severe, 10/10 intensity, and was also constant with sometimes worsening episodes, especially whenever she needs to go to the bathroom, and alleviated by defecation. She was noted to have bright red-looking stool, as well as some vomiting with bright red blood. CT scan shows thickening of the colon as noted above consistent with colitis and the possibility of ischemia as an etiology is rasied.  I am asked to evaluate.  The patient denies food fear or post prandial pain prior to this episode.  She notes some weight loss but on further questioning it does not appear to be profound "I use to wear a size 15 and they are a little loose or baggy now".  PAST MEDICAL HISTORY: Significant for history of colon polyps, history of IBS, history of lumbar as well as lower extremity pain for which she has been followed by Dr. Primus Bravo. History hypertension, diabetes mellitus off diabetic medications since lost weight, history of cardiac murmur, history of coronary artery disease followed by Dr. Koleen Nimrod at [facility]. Last stress test was in 2012.   PAST SURGICAL HISTORY: History of 3 back surgeries, as well as left total hip replacement in 2012. Also she had gallbladder surgery, appendectomy, tonsillectomy and hysterectomy.   Home Medications: Medication Instructions Status  Lyrica 25 mg oral capsule LIMIT  1  TAB PO / DAY IF TOLERATED Active  NORFLEX  100  MG    LIMIT  1  TAB  PO / DAY OR BID IF TOLERATED [ NOTE ONLY TAKE FLEXERIL IF NEEDED AND IF TOLERATED] Active  oxycodone  5  mg  LIMIT  3 - 6  TABS PO / DAY IF TOLERATED Active  Dexilant 60 mg oral delayed release capsule 1 cap(s) orally once a day (in the morning) Active  vitamin A 1 cap(s) orally once a day Active  Vitamin B Complex 100 1 tab(s) orally once a day Active  potassium citrate 1 tab(s) orally once a day Active  isosorbide mononitrate 30 mg oral tablet, extended release 1 tab(s) orally once a day (in the morning) Active  Vitamin D3 5000 intl units oral capsule 1 cap(s) orally once a day Active  vitamin E 400 intl units oral capsule 1 cap(s) orally once a day Active  Vitamin C 500 mg oral tablet 1 tab(s) orally once a day Active  aspirin 81 mg oral tablet 1 tab(s) orally once a day Active  cholestyramine 4 g/5 g oral powder for reconstitution 17 gram(s) mixed with juice orally once a day Active  metpproolol 50 milligram(s) orally once a day Active  simvastatin 20 mg oral tablet 1 tab(s) orally once a day (at bedtime) Active  dicyclomine 20 mg oral tablet  orally bid Active  lisinopril 20 mg oral tablet  orally bid Active  amLODIPine 5 mg oral tablet 1 tab(s) orally once a day Active    Effexor: Chest Pain, Hives, Rash  Sulfa drugs: Unknown  Contrast dye: Unknown  Case History:  Family History  Non-Contributory   Social History negative tobacco, negative ETOH, negative Illicit drugs   Review of Systems:  Fever/Chills No   Cough No   Sputum No   Abdominal Pain Yes   Diarrhea Yes   Constipation No   Nausea/Vomiting Yes   SOB/DOE No   Chest Pain No   Telemetry Reviewed NSR   Dysuria No   Tolerating PT Yes   Physical Exam:  GEN well developed, well nourished, no acute distress   HEENT hearing intact to voice, moist oral mucosa   NECK supple  trachea midline   RESP normal resp effort  no use of accessory muscles   CARD regular rate  No LE edema  no JVD   ABD soft   nondistended   EXTR negative cyanosis/clubbing, negative edema, 2+ normal DP pulses bilaterally   SKIN No rashes, No ulcers   NEURO cranial nerves intact, follows commands, motor/sensory function intact   PSYCH alert, A+O to time, place, person, good insight   Nursing/Ancillary Notes: **Vital Signs.:   28-Sep-15 01:12  Vital Signs Type Q 8hr  Temperature Temperature (F) 98.2  Celsius 36.7  Temperature Source oral  Pulse Pulse 60  Respirations Respirations 18  Systolic BP Systolic BP 382  Diastolic BP (mmHg) Diastolic BP (mmHg) 71  Mean BP 94  Pulse Ox % Pulse Ox % 97  Pulse Ox Activity Level  At rest  Oxygen Delivery Room Air/ 21 %    07:58  Vital Signs Type Q 8hr  Temperature Temperature (F) 98.5  Celsius 36.9  Temperature Source oral  Pulse Pulse 64  Respirations Respirations 18  Systolic BP Systolic BP 505  Diastolic BP (mmHg) Diastolic BP (mmHg) 78  Mean BP 93  Pulse Ox % Pulse Ox % 100  Pulse Ox Activity Level  At rest  Oxygen Delivery Room Air/ 21 %   Hepatic:  25-Sep-15 00:06   Bilirubin, Total 0.7  Alkaline Phosphatase 102 (46-116 NOTE: New Reference Range 07/12/14)  SGPT (ALT) 20 (14-63 NOTE: New Reference Range 07/12/14)  SGOT (AST)  14  Total Protein, Serum 6.5  Albumin, Serum 3.5  Routine Micro:  25-Sep-15 06:13   Micro Text Report STOOL COMPREHENSIVE   COMMENT                   NO SALMONELLA OR SHIGELLA ISOLATED   COMMENT                   NO PATHOGENIC E.COLI DETECTED   COMMENT                   NO CAMPYLOBACTER ANTIGEN DETECTED   ANTIBIOTIC                        Micro Text Report CLOSTRIDIUM DIFFICILE   C.DIFFICILE ANTIGEN       C.DIFFICILE GDH ANTIGEN : NEGATIVE   C.DIFFICILE TOXIN A/B     C.DIFFICILE TOXINS A AND B : NEGATIVE   INTERPRETATION            Negative for C. difficile.    ANTIBIOTIC                        Culture Comment NO SALMONELLA OR SHIGELLA ISOLATED  Culture Comment . NO PATHOGENIC E.COLI DETECTED  Culture Comment     . NO CAMPYLOBACTER ANTIGEN DETECTED  Result(s) reported on 18 Sep 2014 at 08:06AM.  Routine Chem:  25-Sep-15  00:06   Glucose, Serum  161  BUN 8  Creatinine (comp) 0.95  Sodium, Serum 138  Potassium, Serum 3.8  Chloride, Serum 101  CO2, Serum 31  Calcium (Total), Serum  8.4  Osmolality (calc) 277  eGFR (African American) >60  eGFR (Non-African American) >60 (eGFR values <22m/min/1.73 m2 may be an indication of chronic kidney disease (CKD). Calculated eGFR, using the MRDR Study equation, is useful in  patients with stable renal function. The eGFR calculation will not be reliable in acutely ill patients when serum creatinine is changing rapidly. It is not useful in patients on dialysis. The eGFR calculation may not be applicable to patients at the low and high extremes of body sizes, pregnant women, and vetetarians.)  Anion Gap  6  Routine Hem:  25-Sep-15 00:06   Hemoglobin (CBC)  11.3  WBC (CBC) 11.0  RBC (CBC)  3.77  Hematocrit (CBC) 35.2  Platelet Count (CBC) 248  MCV 93  MCH 29.8  MCHC 32.0  RDW 12.8  Neutrophil % 83.3  Lymphocyte % 9.0  Monocyte % 6.4  Eosinophil % 0.5  Basophil % 0.8  Neutrophil #  9.2  Lymphocyte # 1.0  Monocyte # 0.7  Eosinophil # 0.1  Basophil # 0.1 (Result(s) reported on 16 Sep 2014 at 12:51AM.)  28-Sep-15 05:41   Hemoglobin (CBC)  9.7 (Result(s) reported on 19 Sep 2014 at 06:36AM.)   CT:    24-Sep-15 10:51, CT Abdomen Pelvis WO for Stone  CT Abdomen Pelvis WO for Stone   REASON FOR EXAM:    GI bleeding nausea, vomiting, diarreea, LLQ pain  COMMENTS:       PROCEDURE: CT  - CT ABDOMEN /PELVIS WO (STONE)  - Sep 15 2014 10:51AM     CLINICAL DATA:  GI bleeding, left lower quadrant pain    EXAM:  CT ABDOMEN AND PELVIS WITHOUT CONTRAST    TECHNIQUE:  Multidetector CT imaging of the abdomen and pelvis was performed  following the standard protocol without IV contrast.    COMPARISON:  06/14/2013  FINDINGS:  Lung bases are  unremarkable. Sagittal images of the spine shows  posterior metallic fixation LR9-Y5and L5 level. There are metallic  artifacts from left hip prosthesis.    Study is limited without IV and oral contrast. Lung bases are  unremarkable. Small hiatal hernia. Unenhanced liver shows mild  intrahepatic biliary ductal dilatation probable postcholecystectomy.  Unenhanced pancreas, spleen and adrenal glands are unremarkable.  Unenhanced kidneys are symmetrical in size. No hydronephrosis or  hydroureter. No nephrolithiasis.    No calcified ureteral calculi are noted. No calcified calculi are  noted within urinary bladder.  No small bowel obstruction.  No ascites or free air.  No adenopathy.    There is abnormal thickening of colonic wall in distal transverse  colon and splenic flexure of the colon. There is mild stranding of  adjacent mesenteric fat best seen in axial image 29. Mild thickening  of the wall in descending colon. Findings are consistent with  segmental colitis. Clinical correlation is necessary. The uterus and  adnexa are not identified. Degenerative changes pubic symphysis.     IMPRESSION:  1. There is abnormal significant thickening of the colonic wall in  distal transverse colon and splenic flexure of the colon with mild  stranding of adjacent fat. Mild thickening of the wall indescending  colon. Findings are highly suspicious for segmental colitis.  Clinical correlation is necessary.  2. No nephrolithiasis.  No hydronephrosis or hydroureter.  3.  Status postcholecystectomy.  4. Postsurgical changes lumbar spine.  5. Small hiatal hernia.      Electronically Signed    By: Lahoma Crocker M.D.    On: 09/15/2014 11:06         Verified By: Ephraim Hamburger, M.D.,    Impression 1.  Colitis This could represent ischemic colitis, However, she does no give a history that would reflect severe chronic ischemis.  I have reviewed the CT can myself which was without contrast but there is  very little calcification of her arteries in gerneral and no calcium at all at the origin of the SMA and the IMA which are well visualized.  It is possible that she could have soft plaque occluding these vessels but unlikely.  I would recommend a duplex ultrasound of the mesenteric vessels whcih would be done as an out patient.  General surgery is to evaluate her and if there is an immediate concern for ischemia then a CT angiogram would be needed.  Her BUN/Cr are fine and she should be able to have this study without issue.  Otherwise as noted I would follow up with her in the office and obtaine aduplex [place]. 2. Gastrointestinal bleed. We will start the patient on a proton pump inhibitor IV. We will hold aspirin therapy. We will get the gastroenterologist involved for further recommendations. 3. Acute gastroenteritis of unclear etiology, questionable inflammatory, toxic, infectious. As the patient had a mild fever, we are going to initiate her on Cipro and Flagyl. We will also get stool cultures and we follow the patient clinically.  4. Likely chronic obstructive pulmonary disease. We will start the patient on humibid as well as DuoNebs.  5.  Coronary artery disease  coninue home meds 6.  LS spine disease   pain medication as needed avoid constipation as this would make the colitis worse.   Electronic Signatures: Hortencia Pilar (MD)  (Signed 28-Sep-15 13:18)  Authored: General Aspect/Present Illness, Home Medications, Allergies, History and Physical Exam, Vital Signs, Labs, Radiology, Impression/Plan   Last Updated: 28-Sep-15 13:18 by Hortencia Pilar (MD)

## 2015-04-15 NOTE — Consult Note (Signed)
Pt still has signif pain and tenderness, small amt blood passed. VSS afebrile.  consider surgical consult if no improvement.  Consider vascular surgery consult.  No new suggestions otherwise.  Electronic Signatures: Scot JunElliott, Kizer Nobbe T (MD)  (Signed on 27-Sep-15 11:50)  Authored  Last Updated: 27-Sep-15 11:50 by Scot JunElliott, Dyshon Philbin T (MD)

## 2015-04-15 NOTE — Consult Note (Signed)
Pt with CT involvement of distal transverse and splenic area, some descending colon as well.  Still has signif pain and required oxycodone. Some bleeding about 1am last night. No vomiting   PE abd pain with coughing and mild palpation. On cipro and flagyl.  Still making slow progress.  Usually takes 5-7 days to recover from spell of ischemic colitis and some need surgery.  Would continue current treatment for now.  Since has contrast allergy may need Doppler US with vascular surgeons after discharge and would bring them in the loop in a  day or two.  Electronic Signatures: Scot JunElliott, Robert T (MD)  (Signed on 26-Sep-15 11:03)  Authored  Last Updated: 26-Sep-15 11:03 by Scot JunElliott, Robert T (MD)

## 2015-04-15 NOTE — Consult Note (Signed)
PATIENT NAME:  Yvette RakersWHITE, Kennedie L MR#:  [id number] DATE OF BIRTH:  26-Feb-1941  DATE OF CONSULTATION:  09/19/2014  REFERRING PHYSICIAN:   CONSULTING PHYSICIAN:  Quentin Orealph L. Ely III, MD  BRIEF HISTORY:  Hattie PerchMary Krichbaum is a 74 year old woman with a history of IBS, longstanding  sacroiliac and lumbar pain, multiple other medical problems including hypertension and diabetes, presented to the Emergency Room with significant abdominal pain, nausea, vomiting, diarrhea. She also had some bright red blood per rectum. She also has some significant urinary tract symptoms. She has had a history of IBS for several years, having undergone a colonoscopy in 2014 which demonstrated some mild polyp disease, but no note made of any ischemic change. She had noncontrast CT scan in the Emergency Room which suggested splenic flexure, distal transverse colon colitis with inflammatory change consistent with infectious etiology versus a vascular etiology. She has not had any previous colon surgery. She has had multiple back surgeries, left hip replacement, gallbladder surgery, hysterectomy, and appendectomy. The remainder of her medical workup is reviewed in her admission note. She is being followed by the medical service.   REVIEW OF SYSTEMS: Otherwise unremarkable with exception of persistent frequency and urgency with regard to urination.   CURRENT MEDICATIONS: Include amlodipine 5 mg p.o. daily, aspirin 81 mg p.o. daily, cholestyramine 5 grams once a day, Dexilant 60 mg once a day, dicyclomine 20 mg b.i.d., isosorbide mononitrate 30 mg once a day, lisinopril 20 mg once a day, Lyrica 25 mg once a day, simvastatin 20 mg once a day, multiple vitamins, and potassium supplement.   PHYSICAL EXAMINATION:  GENERAL: She is an alert, uncomfortable woman, anxious and crying at the time of our evaluation.  VITAL SIGNS:  She is afebrile. Heart rate is in the 70s, blood pressure 120/70.  HEENT: No scleral icterus. No pupillary abnormalities.  NECK:  Supple, nontender, with a midline trachea. No adenopathy.  CHEST: Clear with no adventitious sounds. Normal pulmonary excursion.  CARDIAC: No murmurs or gallops. She seems to be in normal sinus rhythm.  ABDOMEN: Soft, but she does have some mild left upper quadrant and left lower quadrant tenderness and some mild suprapubic tenderness. She has no rebound, no significant guarding. She has hypoactive bowel sounds.  EXTREMITIES: Full range of motion. Mild edema. No deformities.  PSYCHIATRIC: Reveals anxious affect, but otherwise normal orientation.   ASSESSMENT AND PLAN: I have independently reviewed her CT scan. Her CT scan very difficult to interpret without p.o. or IV contrast. I agree with Dr. Marijean HeathSchnier's assessment that this is possibly a vascular problem, but does not appear to be a large vessel issue. I am not sure if is a vascular interventional procedure that would be of benefit to her.  I would consider direct visualization of her colon mucosa to identify the extent of disease and confirm the possibility of an ischemic problem. She needs supportive therapy, pain medicine, further evaluation with contrast on CT scan. We will continue to follow her. At the present time I do not see any indication for urgent surgical intervention.    ____________________________ Quentin Orealph L. Ely III, MD rle:bu D: 09/19/2014 17:10:09 ET T: 09/19/2014 17:38:36 ET JOB#: [id number]  cc: Quentin Orealph L. Ely III, MD, <Dictator> Quentin OreALPH L ELY MD ELECTRONICALLY SIGNED 09/23/2014 17:52

## 2015-04-15 NOTE — Consult Note (Signed)
Chief Complaint:  Subjective/Chief Complaint Pt denies any further hematemesis or vomiting.  She did have another bloody stool.  Generalized abd pain 4/10.  She has been NPO.   VITAL SIGNS/ANCILLARY NOTES: **Vital Signs.:   25-Sep-15 07:45  Vital Signs Type Q 8hr  Temperature Temperature (F) 98.4  Celsius 36.8  Temperature Source oral  Pulse Pulse 68  Respirations Respirations 17  Systolic BP Systolic BP 675  Diastolic BP (mmHg) Diastolic BP (mmHg) 80  Mean BP 99  Pulse Ox % Pulse Ox % 97  Pulse Ox Activity Level  At rest  Oxygen Delivery Room Air/ 21 %   Brief Assessment:  GEN well developed, well nourished, no acute distress, A/Ox3   Cardiac Regular   Respiratory normal resp effort   Gastrointestinal Normal   Gastrointestinal details normal Soft  Nondistended  Bowel sounds normal  No rebound tenderness  No gaurding  No rigidity  +mild TTP entire abdomen   EXTR negative cyanosis/clubbing, negative edema   Additional Physical Exam Skin: warm, dry, no jaundice   Lab Results:  Hepatic:  25-Sep-15 00:06   Bilirubin, Total 0.7  Alkaline Phosphatase 102 (46-116 NOTE: New Reference Range 07/12/14)  SGPT (ALT) 20 (14-63 NOTE: New Reference Range 07/12/14)  SGOT (AST)  14  Total Protein, Serum 6.5  Albumin, Serum 3.5  Routine Micro:  25-Sep-15 06:13   Micro Text Report CLOSTRIDIUM DIFFICILE   C.DIFFICILE ANTIGEN       C.DIFFICILE GDH ANTIGEN : NEGATIVE   C.DIFFICILE TOXIN A/B     C.DIFFICILE TOXINS A AND B : NEGATIVE   INTERPRETATION            Negative for C. difficile.    ANTIBIOTIC                        Routine Chem:  25-Sep-15 00:06   Glucose, Serum  161  BUN 8  Creatinine (comp) 0.95  Sodium, Serum 138  Potassium, Serum 3.8  Chloride, Serum 101  CO2, Serum 31  Calcium (Total), Serum  8.4  Osmolality (calc) 277  eGFR (Non-African American) >60 (eGFR values <13m/min/1.73 m2 may be an indication of chronic kidney disease (CKD). Calculated eGFR, using  the MRDR Study equation, is useful in  patients with stable renal function. The eGFR calculation will not be reliable in acutely ill patients when serum creatinine is changing rapidly. It is not useful in patients on dialysis. The eGFR calculation may not be applicable to patients at the low and high extremes of body sizes, pregnant women, and vetetarians.)  Anion Gap  6  Routine Hem:  25-Sep-15 00:06   WBC (CBC) 11.0  RBC (CBC)  3.77  Hemoglobin (CBC)  11.3  Hematocrit (CBC) 35.2  Platelet Count (CBC) 248  MCV 93  MCH 29.8  MCHC 32.0  RDW 12.8  Neutrophil % 83.3  Lymphocyte % 9.0  Monocyte % 6.4  Eosinophil % 0.5  Basophil % 0.8  Neutrophil #  9.2  Lymphocyte # 1.0  Monocyte # 0.7  Eosinophil # 0.1  Basophil # 0.1 (Result(s) reported on 16 Sep 2014 at 12:51AM.)   Assessment/Plan:  Assessment/Plan:  Assessment Acute segmental colitis:  Likely ischemic.  On empiric cipro/flagyl.  C diff negative.  Stool Cx pending.  Last colonoscopy up to date.  Hematemesis:  Mild drop in hgb likely dilutional.  EGD to look for Mallory-Weiss tear, erosive/ulcerative esophagitis, gastritis or peptic ulcer disease.   Plan 1) Continue b.i.d. Protonix 40  mg 2) EGD today with Dr Allen Norris.  Discussed risks/benefits of procedure which include but are not limited to bleeding, infection, perforation & drug reaction.  Patient agrees with this plan & consent will be obtained. 3) continue empiric Cipro and Flagyl 4) continue supportive measures including fluids, pain control and antiemetics.  5) follow up stool culture Please call if you have any questions or concerns   Electronic Signatures: Andria Meuse (NP)  (Signed 25-Sep-15 18:34)  Authored: Chief Complaint, VITAL SIGNS/ANCILLARY NOTES, Brief Assessment, Lab Results, Assessment/Plan   Last Updated: 25-Sep-15 18:34 by Andria Meuse (NP)

## 2015-04-15 NOTE — Consult Note (Signed)
PATIENT NAME:  Yvette Collier, Yvette L MR#:  [id number] DATE OF BIRTH:  [birth year]  DATE OF CONSULTATION:  09/15/2014  REFERRING PHYSICIAN:  Katharina Caperima Vaickute, MD CONSULTING PHYSICIAN:  Joselyn ArrowKandice L. Ezekeil Bethel, NP  REASON FOR CONSULTATION: Hematochezia and hematemesis.  PRIMARY CARE PHYSICIAN: Hyman HopesHarriett P. Burns, MD    CONSULTING AND PRIMARY GASTROENTEROLOGIST: Midge Miniumarren Wohl, MD  HISTORY OF PRESENT ILLNESS: [name] is a 74 year old black female well known to Dr. Servando SnareWohl with history of irritable bowel syndrome, chronic GERD, history of colonic adenomatous and hyperplastic polyps, diabetes mellitus and hypertension who presents with acute abdominal pain, nausea, vomiting, hematemesis and diarrhea followed by rectal bleeding. She was in her usual state of health until last night when she developed severe lower abdominal aching. She described the pain as 9/10 on pain scale. She then began to have loose diarrheal stools at 3:00 a.m. with bright red blood. She then had nausea and vomiting this morning with a small amount of bright red blood in her emesis. At first she thought it was just spaghetti but then she believes that she saw some blood clots as well. She tells me she had too numerous to count episodes of nausea, vomiting and diarrhea since last night. She was on antibiotic Ceftin about 2 weeks ago for a recent procedure on her back. She denies any nonsteroidal anti-inflammatories. Her hemoglobin was stable at 12.7 upon admission, with an INR of 1. She had 2 view abdomen films which were benign. She had a noncontrast CT of abdomen and pelvis which showed distal transverse colon, splenic flexure thickening with mild stranding of the adjacent fat, mild descending wall thickening, suspicious for segmental colitis and a small hiatal hernia. She has a Clostridium difficile and culture pending. She has been started on b.i.d. PPI. She has history of IBS and is on dicyclomine, Align and Questran on an outpatient basis. She takes  Dexilant 60 mg daily and feels that this is well controlling her acid reflux. She denies any dysphagia or odynophagia. Her weight has been relatively stable. Last colonoscopy was 11/08/2013 by Dr. Clent RidgesWalsh. She had a 7 mm cecal hyperplastic polyp and a 6 mm distal transverse hyperplastic polyp removed. She also had nonbleeding internal hemorrhoids. Last EGD was by Dr. Niel HummerIftikhar 01/22/2012. She had a small hiatal hernia, gastritis and normal duodenum. Biopsy was negative for Helicobacter pylori and she did have mild to moderate reactive gastropathy.   PAST MEDICAL AND SURGICAL HISTORY: Irritable bowel syndrome, chronic GERD, history of adenomatous colon polyps years ago, hysterectomy, hemorrhoid banding, hypertension, heart murmur, osteoarthritis, diabetes mellitus, chronic low back pain followed by Dr. Metta Clinesrisp, coronary artery disease [facility]. She has had 3 back surgeries, left total hip replacement in 2012, cholecystectomy, appendectomy, tonsillectomy.   MEDICATIONS PRIOR TO ADMISSION: Amlodipine 5 mg daily, aspirin 81 mg daily, Questran 4 grams daily p.r.n., Dexilant 60 mg daily, dicyclomine 20 mg b.i.d. p.r.n., isosorbide 30 mg q.a.m., lisinopril 20 mg b.i.d., Lyrica 25 mg daily, metoprolol 50 mg daily, Norflex 1 tablet p.o.  b.i.d. p.r.n., oxycodone 5 mg 3 to 6 tablets daily p.r.n. pain, potassium citrate 1 tablet daily, simvastatin 20 mg daily. Vitamin A, B complex, C, D3 and vitamin E all once daily.   ALLERGIES: IV CONTRAST CAUSED HIVES AND SHORTNESS OF BREATH. EFFEXOR CAUSED CHEST PAIN, HIVES AND RASH. SULFA CAUSED AN UNKNOWN REACTION.   FAMILY HISTORY: Positive for a mother with " chronic stomach problems." There is no known family history of "carcinoma of  the liver or GI malignancy."   SOCIAL HISTORY: She is divorced. She lives with her daughter. She has 4 healthy children. 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 She denies  any tobacco, alcohol or illicit drug use.   REVIEW OF SYSTEMS: See HPI. MUSCULOSKELETAL: She has chronic low back pain, [facility]; otherwise  negative 10-point review of systems.   PHYSICAL EXAMINATION:  VITAL SIGNS: Temperature 98.3, pulse 70, respirations 18, blood pressure 172/95, oxygen saturation 98% on room air.  GENERAL: She is a well-developed, well-nourished, black female in no acute distress. She is accompanied by her daughter.  HEENT: Sclerae clear, nonicteric. Conjunctivae pink. Oropharynx pink and moist without any lesions.  NECK: Supple without mass or thyromegaly.  HEART: Regular rate and rhythm. Normal S1, S2. A 2/6 murmur is noted.  LUNGS: Clear to auscultation bilaterally.  ABDOMEN: Positive bowel sounds x 4. No bruits auscultated. Abdomen is soft, nontender, nondistended, without palpable mass or hepatosplenomegaly. No rebound tenderness or guarding.  EXTREMITIES: Without clubbing or edema.  SKIN: Warm and dry without any rash or jaundice.  NEUROLOGIC: Grossly intact.  MUSCULOSKELETAL: Good equal movement and strength bilaterally.  RECTAL: Deferred.  PSYCHIATRIC: Alert, cooperative, normal mood and affect.   LABORATORY STUDIES: Glucose 210, anion gap 5, otherwise normal basic metabolic panel. Lipase is 83, hemoglobin A1c is 8, alkaline phosphatase 126, total protein 8.3, otherwise normal LFTs. One troponin is back and negative. Urinalysis shows 50 mg/dL of glucose, otherwise normal.   IMPRESSION: [name] is a pleasant 74 year old black female known to Dr. Servando Snare with history of irritable bowel syndrome, gastroesophageal reflux disease, history of colonic adenomatous polyps and hemorrhoids who presents with acute abdominal pain, diarrhea with hematochezia and hematemesis. Her hemoglobin is normal on admission and will be rechecked to look for acute blood loss. She has segmental colitis most consistent with ischemic colitis on CT; however, her CT  is a noncontrast examination, so her mesentery cannot be evaluated and subtle changes could be missed. Agree with empiric antibiotic coverage to treat infectious etiology and check for Clostridium difficile given recent antibiotics. As far as hematemesis, most likely this is due to Mallory-Weiss tear versus erosive esophagitis, gastritis or peptic ulcer disease. If her hemoglobin drops, she will need an EGD. Her colonoscopy is up to date.   PLAN:  1.  Agree with b.i.d. Protonix 40 mg.  2.  Agree with empiric Cipro and Flagyl.  3.  Continue supportive measures including fluids, pain control and antiemetics.  4.  Stop dicyclomine.  5.  Agree with Clostridium difficile and culture stool.  Thank you for allowing [place] to participate in her care.   ____________________________ Joselyn Arrow, NP klj:TT D: 09/15/2014 14:37:13 ET T: 09/15/2014 15:05:38 ET JOB#: [id number]  cc: Joselyn Arrow, NP, <Dictator> Hyman Hopes, MD Joselyn Arrow FNP ELECTRONICALLY SIGNED 10/05/2014 11:25

## 2015-04-15 NOTE — Consult Note (Signed)
Brief Consult Note: Diagnosis: hematochezia, hematemesis.   Patient was seen by consultant.   Comments: Yvette Collier is a pleasant 74 y/o black female known to Dr Servando SnareWohl with hx IBS, GERD, hx colonic adenomatous polyps & hemorrhoids who presents with acute abdominal pain, diarrhea with hematochezia & hematemesis.  Hgb normal on admission & will be rechecked to look for acute blood loss.  She has segmental colitis most consistent with ischemic colitis however CT is a noncontrast exam so mesentery cannot be evaluated & subtle changes may be missed.  Agree with empiric antibiotic coverage to treat infectious etiology & check for c diff given recent antibiotics.  As far as hematemesis most likely Mallory Weiss tear versus erosive esophagitis, gastritis or PUD.  If hgb drops, she will need EGD.  Colonoscopy up to date.  Plan: 1) Agree with BID Protonix 40mg  2) Agree with empiric cipro & flagyl 3) continue supportive measures including fluids, pain control, & antiemetics 4) STOP dicyclomine 5) Agree with c diff & culture of stool Thanks for allowing us to participate in her care.  Please see full dictated note. #[id number].  Electronic Signatures: Joselyn ArrowJones, Bernabe Dorce L (NP)  (Signed 24-Sep-15 14:37)  Authored: Brief Consult Note   Last Updated: 24-Sep-15 14:37 by Joselyn ArrowJones, Antawn Sison L (NP)

## 2015-05-11 ENCOUNTER — Encounter (INDEPENDENT_AMBULATORY_CARE_PROVIDER_SITE_OTHER): Payer: Self-pay

## 2015-05-11 ENCOUNTER — Ambulatory Visit: Payer: Medicare Other | Attending: Pain Medicine | Admitting: Pain Medicine

## 2015-05-11 ENCOUNTER — Encounter: Payer: Self-pay | Admitting: Pain Medicine

## 2015-05-11 VITALS — BP 129/55 | HR 45 | Temp 98.8°F | Resp 18 | Ht 65.0 in | Wt 162.0 lb

## 2015-05-11 DIAGNOSIS — M79604 Pain in right leg: Secondary | ICD-10-CM | POA: Insufficient documentation

## 2015-05-11 DIAGNOSIS — M47816 Spondylosis without myelopathy or radiculopathy, lumbar region: Secondary | ICD-10-CM | POA: Insufficient documentation

## 2015-05-11 DIAGNOSIS — R531 Weakness: Secondary | ICD-10-CM | POA: Diagnosis not present

## 2015-05-11 DIAGNOSIS — M545 Low back pain: Secondary | ICD-10-CM | POA: Diagnosis present

## 2015-05-11 DIAGNOSIS — M79605 Pain in left leg: Secondary | ICD-10-CM | POA: Insufficient documentation

## 2015-05-11 DIAGNOSIS — M51369 Other intervertebral disc degeneration, lumbar region without mention of lumbar back pain or lower extremity pain: Secondary | ICD-10-CM | POA: Insufficient documentation

## 2015-05-11 DIAGNOSIS — M48062 Spinal stenosis, lumbar region with neurogenic claudication: Secondary | ICD-10-CM | POA: Insufficient documentation

## 2015-05-11 DIAGNOSIS — E1340 Other specified diabetes mellitus with diabetic neuropathy, unspecified: Secondary | ICD-10-CM | POA: Insufficient documentation

## 2015-05-11 DIAGNOSIS — M5136 Other intervertebral disc degeneration, lumbar region: Secondary | ICD-10-CM | POA: Insufficient documentation

## 2015-05-11 DIAGNOSIS — M533 Sacrococcygeal disorders, not elsewhere classified: Secondary | ICD-10-CM | POA: Insufficient documentation

## 2015-05-11 DIAGNOSIS — M961 Postlaminectomy syndrome, not elsewhere classified: Secondary | ICD-10-CM

## 2015-05-11 MED ORDER — PREGABALIN 25 MG PO CAPS: ORAL_CAPSULE | ORAL | Status: DC

## 2015-05-11 MED ORDER — OXYCODONE HCL 5 MG PO TABS: ORAL_TABLET | ORAL | Status: DC

## 2015-05-11 NOTE — Patient Instructions (Addendum)
Continue present medications. Lyrica and oxycodone  Lumbar epidural steroid injection 05/17/2015. Aspirin for 5 days if okay with primary care physician  F/U PCP for evaliation of  BP and general medical  condition.  F/U surgical evaluation.  F/U neurological evaluation.  May consider radiofrequency rhizolysis or intraspinal procedures pending response to present treatment and F/U evaluation.  [facility] to [facility] should patient have concerns prior to scheduled return appointment. Epidural Steroid Injection An epidural steroid injection is given to relieve pain in your neck, back, or legs that is caused by the irritation or swelling of a nerve root. This procedure involves injecting a steroid and numbing medicine (anesthetic) into the epidural space. The epidural space is the space between the outer covering of your spinal cord and the bones that form your backbone (vertebra).  LET [facility] CARE PROVIDER KNOW ABOUT:   Any allergies you have.  All medicines you are taking, including vitamins, herbs, eye drops, creams, and over-the-counter medicines such as aspirin.  Previous problems you or members of your family have had with the use of anesthetics.  Any blood disorders or blood clotting disorders you have.  Previous surgeries you have had.  Medical conditions you have. RISKS AND COMPLICATIONS Generally, this is a safe procedure. However, as with any procedure, complications can occur. Possible complications of epidural steroid injection include:  Headache.  Bleeding.  Infection.  Allergic reaction to the medicines.  Damage to your nerves. The response to this procedure depends on the underlying cause of the pain and its duration. People who have long-term (chronic) pain are less likely to benefit from epidural steroids than are those people whose pain comes on strong and suddenly. BEFORE THE PROCEDURE   Ask your health care provider about changing or  stopping your regular medicines. You may be advised to stop taking blood-thinning medicines a few days before the procedure.  You may be given medicines to reduce anxiety.  Arrange for someone to take you home after the procedure. PROCEDURE   You will remain awake during the procedure. You may receive medicine to make you relaxed.  You will be asked to lie on your stomach.  The injection site will be cleaned.  The injection site will be numbed with a medicine (local anesthetic).  A needle will be injected through your skin into the epidural space.  Your health care provider will use an X-ray machine to ensure that the steroid is delivered closest to the affected nerve. You may have minimal discomfort at this time.  Once the needle is in the right position, the local anesthetic and the steroid will be injected into the epidural space.  The needle will then be removed and a bandage will be applied to the injection site. AFTER THE PROCEDURE   You may be monitored for a short time before you go home.  You may feel weakness or numbness in your arm or leg, which disappears within hours.  You may be allowed to eat, drink, and take your regular medicine.  You may have soreness at the site of the injection. Document Released: 03/17/2008 Document Revised: 08/11/2013 Document Reviewed: 05/28/2013 [facility] Patient Information 2015 [place], [place]. This information is not intended to replace advice given to you by your health care provider. Make sure you discuss any questions you have with your health care provider. GENERAL RISKS AND COMPLICATIONS  What are the risk, side effects and possible complications? Generally speaking, most procedures are safe.  However, with any procedure  there are risks, side effects, and the possibility of complications.  The risks and complications are dependent upon the sites that are lesioned, or the type of nerve block to be performed.  The closer the procedure  is to the spine, the more serious the risks are.  Great care is taken when placing the radio frequency needles, block needles or lesioning probes, but sometimes complications can occur.  Infection: Any time there is an injection through the skin, there is a risk of infection.  This is why sterile conditions are used for these blocks.  There are four possible types of infection.  Localized skin infection.  Central Nervous System Infection-This can be in the form of Meningitis, which can be deadly.  Epidural Infections-This can be in the form of an epidural abscess, which can cause pressure inside of the spine, causing compression of the spinal cord with subsequent paralysis. This would require an emergency surgery to decompress, and there are no guarantees that the patient would recover from the paralysis.  Discitis-This is an infection of the intervertebral discs.  It occurs in about 1% of discography procedures.  It is difficult to treat and it may lead to surgery.        2. Pain: the needles have to go through skin and soft tissues, will cause soreness.       3. Damage to internal structures:  The nerves to be lesioned may be near blood vessels or    other nerves which can be potentially damaged.       4. Bleeding: Bleeding is more common if the patient is taking blood thinners such as  aspirin, Coumadin, Ticiid, Plavix, etc., or if he/she have some genetic predisposition  such as hemophilia. Bleeding into the spinal canal can cause compression of the spinal  cord with subsequent paralysis.  This would require an emergency surgery to  decompress and there are no guarantees that the patient would recover from the  paralysis.       5. Pneumothorax:  Puncturing of a lung is a possibility, every time a needle is introduced in  the area of the chest or upper back.  Pneumothorax refers to free air around the  collapsed lung(s), inside of the thoracic cavity (chest cavity).  Another two possible   complications related to a similar event would include: Hemothorax and Chylothorax.   These are variations of the Pneumothorax, where instead of air around the collapsed  lung(s), you may have blood or chyle, respectively.       6. Spinal headaches: They may occur with any procedures in the area of the spine.       7. Persistent CSF (Cerebro-Spinal Fluid) leakage: This is a rare problem, but may occur  with prolonged intrathecal or epidural catheters either due to the formation of a fistulous  track or a dural tear.       8. Nerve damage: By working so close to the spinal cord, there is always a possibility of  nerve damage, which could be as serious as a permanent spinal cord injury with  paralysis.       9. Death:  Although rare, severe deadly allergic reactions known as "Anaphylactic  reaction" can occur to any of the medications used.      10. Worsening of the symptoms:  We can always make thing worse.  What are the chances of something like this happening? Chances of any of this occuring are extremely low.  By statistics, you have  more of a chance of getting killed in a motor vehicle accident: while driving to the hospital than any of the above occurring .  Nevertheless, you should be aware that they are possibilities.  In general, it is similar to taking a shower.  Everybody knows that you can slip, hit your head and get killed.  Does that mean that you should not shower again?  Nevertheless always keep in mind that statistics do not mean anything if you happen to be on the wrong side of them.  Even if a procedure has a 1 (one) in a 1,000,000 (million) chance of going wrong, it you happen to be that one..Also, keep in mind that by statistics, you have more of a chance of having something go wrong when taking medications.  Who should not have this procedure? If you are on a blood thinning medication (e.g. Coumadin, Plavix, see list of "Blood Thinners"), or if you have an active infection going on,  you should not have the procedure.  If you are taking any blood thinners, please inform your physician.  How should I prepare for this procedure?  Do not eat or drink anything at least six hours prior to the procedure.  Bring a driver with you .  It cannot be a taxi.  Come accompanied by an adult that can drive you back, and that is strong enough to help you if your legs get weak or numb from the local anesthetic.  Take all of your medicines the morning of the procedure with just enough water to swallow them.  If you have diabetes, make sure that you are scheduled to have your procedure done first thing in the morning, whenever possible.  If you have diabetes, take only half of your insulin dose and notify our nurse that you have done so as soon as you arrive at the clinic.  If you are diabetic, but only take blood sugar pills (oral hypoglycemic), then do not take them on the morning of your procedure.  You may take them after you have had the procedure.  Do not take aspirin or any aspirin-containing medications, at least eleven (11) days prior to the procedure.  They may prolong bleeding.  Wear loose fitting clothing that may be easy to take off and that you would not mind if it got stained with Betadine or blood.  Do not wear any jewelry or perfume  Remove any nail coloring.  It will interfere with some of our monitoring equipment.  NOTE: Remember that this is not meant to be interpreted as a complete list of all possible complications.  Unforeseen problems may occur.  BLOOD THINNERS The following drugs contain aspirin or other products, which can cause increased bleeding during surgery and should not be taken for 2 weeks prior to and 1 week after surgery.  If you should need take something for relief of minor pain, you may take acetaminophen which is found in Tylenol,m Datril, Anacin-3 and Panadol. It is not blood thinner. The products listed below are.  Do not take any of the  products listed below in addition to any listed on your instruction sheet.  A.P.C or A.P.C with Codeine Codeine Phosphate Capsules #3 Ibuprofen Ridaura  ABC compound Congesprin Imuran rimadil  Advil Cope Indocin Robaxisal  Alka-Seltzer Effervescent Pain Reliever and Antacid Coricidin or Coricidin-D  Indomethacin Rufen  Alka-Seltzer plus Cold Medicine Cosprin Ketoprofen S-A-C Tablets  Anacin Analgesic Tablets or Capsules Coumadin Korlgesic Salflex  Anacin Extra Strength Analgesic tablets  or capsules CP-2 Tablets Lanoril Salicylate  Anaprox Cuprimine Capsules Levenox Salocol  Anexsia-D Dalteparin Magan Salsalate  Anodynos Darvon compound Magnesium Salicylate Sine-off  Ansaid Dasin Capsules Magsal Sodium Salicylate  Anturane Depen Capsules Marnal Soma  APF Arthritis pain formula Dewitt's Pills Measurin Stanback  Argesic Dia-Gesic Meclofenamic Sulfinpyrazone  Arthritis Bayer Timed Release Aspirin Diclofenac Meclomen Sulindac  Arthritis pain formula Anacin Dicumarol Medipren Supac  Analgesic (Safety coated) Arthralgen Diffunasal Mefanamic Suprofen  Arthritis Strength Bufferin Dihydrocodeine Mepro Compound Suprol  Arthropan liquid Dopirydamole Methcarbomol with Aspirin Synalgos  ASA tablets/Enseals Disalcid Micrainin Tagament  Ascriptin Doan's Midol Talwin  Ascriptin A/D Dolene Mobidin Tanderil  Ascriptin Extra Strength Dolobid Moblgesic Ticlid  Ascriptin with Codeine Doloprin or Doloprin with Codeine Momentum Tolectin  Asperbuf Duoprin Mono-gesic Trendar  Aspergum Duradyne Motrin or Motrin IB Triminicin  Aspirin plain, buffered or enteric coated Durasal Myochrisine Trigesic  Aspirin Suppositories Easprin Nalfon Trillsate  Aspirin with Codeine Ecotrin Regular or Extra Strength Naprosyn Uracel  Atromid-S Efficin Naproxen Ursinus  Auranofin Capsules Elmiron Neocylate Vanquish  Axotal Emagrin Norgesic Verin  Azathioprine Empirin or Empirin with Codeine Normiflo Vitamin E  Azolid Emprazil  Nuprin Voltaren  Bayer Aspirin plain, buffered or children's or timed BC Tablets or powders Encaprin Orgaran Warfarin Sodium  Buff-a-Comp Enoxaparin Orudis Zorpin  Buff-a-Comp with Codeine Equegesic Os-Cal-Gesic   Buffaprin Excedrin plain, buffered or Extra Strength Oxalid   Bufferin Arthritis Strength Feldene Oxphenbutazone   Bufferin plain or Extra Strength Feldene Capsules Oxycodone with Aspirin   Bufferin with Codeine Fenoprofen Fenoprofen Pabalate or Pabalate-SF   Buffets II Flogesic Panagesic   Buffinol plain or Extra Strength Florinal or Florinal with Codeine Panwarfarin   Buf-Tabs Flurbiprofen Penicillamine   Butalbital Compound Four-way cold tablets Penicillin   Butazolidin Fragmin Pepto-Bismol   Carbenicillin Geminisyn Percodan   Carna Arthritis Reliever Geopen Persantine   Carprofen Gold's salt Persistin   Chloramphenicol Goody's Phenylbutazone   Chloromycetin Haltrain Piroxlcam   Clmetidine heparin Plaquenil   Cllnoril Hyco-pap Ponstel   Clofibrate Hydroxy chloroquine Propoxyphen         Before stopping any of these medications, be sure to consult the physician who ordered them.  Some, such as Coumadin (Warfarin) are ordered to prevent or treat serious conditions such as "deep thrombosis", "pumonary embolisms", and other heart problems.  The amount of time that you may need off of the medication may also vary with the medication and the reason for which you were taking it.  If you are taking any of these medications, please make sure you notify your pain physician before you undergo any procedures.

## 2015-05-11 NOTE — Progress Notes (Signed)
   Subjective:    Patient ID: Yvette Collier, female    DOB: 08/24/1941, 74 y.o.   MRN: [id number]  HPI  Patient is 74 year old female [facility] further evaluation and treatment of pain involving the lower back and lower extremity regions with pain of the lower back radiating to the buttocks and lower extremity regions associated with weakness after prolonged standing. Patient denies any trauma change in events of daily living the call significant change in symptomatology wished to proceed with interventional treatment at time return appointment in attempt to decrease severity of symptoms minimize risk of medication escalation hopefully retard progression of patient's symptoms and hopefully avoid the need for more involved treatment. We will proceed with lumbar epidural steroid injection at time return appointment as discussed and explained to patient on today's visit      Review of Systems     Objective:   Physical Exam   Tenderness to palpation of the splenius capitis muscles of mild degree with mild tenderness of this of the occipitalis  muscles as well. Palpation over the region of the cervical facets with mild discomfort as well as mild tenderness over the thoracic facet region. There was unremarkable Spurling's maneuver Tinel and Phalen's maneuver without increased pain of significant degree. Over the thoracic facet without increased pain of significant degree and no crepitus of the thoracic region noted. Palpation of the lumbar paraspinal muscles and lumbar facet region reproduces moderately severe discomfort with palpation of the gluteal and piriformis muscles reproducing moderate discomfort as well straight leg raising limited to approximately 20 with questional increased pain with dorsiflexion noted with negative clonus and negative Homans. Abdomen without excessive tends to palpation no costovertebral tenderness noted. Mild to is on the greater trochanteric  region and iliotibial band region.      Assessment & Plan:     Plan   Continue present medications. Lyrica and oxycodone    We will perform lumbar epidural steroid injection We will proceed with lumbar epidural steroid injection in attempt to decrease severity of symptoms minimize progression of patient's symptoms and hopefully avoid the need for more involved treatment. We will proceed with is felt to be medically necessary procedure lumbar epidural steroid injection  F/U PCP for evaliation of  BP and general medical  condition.  F/U surgical evaluation.  F/U neurological evaluation.  May consider radiofrequency rhizolysis or intraspinal procedures pending response to present treatment and F/U evaluation.  Patient to call [facility] should patient have concerns prior to scheduled return appointment.

## 2015-05-11 NOTE — Progress Notes (Signed)
   Subjective:    Patient ID: Yvette Collier, female    DOB: 02-Jun-1941, 74 y.o.   MRN: [id number]  HPI    Review of Systems     Objective:   Physical Exam        Assessment & Plan:

## 2015-05-11 NOTE — Progress Notes (Signed)
   Subjective:    Patient ID: Yvette Collier, female    DOB: 06/17/1941, 73 y.o.   MRN: [id number]  HPI    Review of Systems     Objective:   Physical Exam        Assessment & Plan:   

## 2015-05-11 NOTE — Progress Notes (Signed)
Patient discharge,ambulatory  Scripts given on  Oxycodone and lyrica Teach back 3 done Instructions given on procedure epidural Faxed request to Dr Myrene GalasHarriet Burns to stop aspirin

## 2015-05-17 ENCOUNTER — Encounter: Payer: Self-pay | Admitting: Pain Medicine

## 2015-05-17 ENCOUNTER — Ambulatory Visit: Payer: Medicare Other | Attending: Pain Medicine | Admitting: Pain Medicine

## 2015-05-17 VITALS — BP 115/56 | HR 42 | Temp 99.3°F | Resp 16 | Ht 65.0 in | Wt 162.0 lb

## 2015-05-17 DIAGNOSIS — M545 Low back pain: Secondary | ICD-10-CM | POA: Diagnosis present

## 2015-05-17 DIAGNOSIS — M48062 Spinal stenosis, lumbar region with neurogenic claudication: Secondary | ICD-10-CM

## 2015-05-17 DIAGNOSIS — M961 Postlaminectomy syndrome, not elsewhere classified: Secondary | ICD-10-CM

## 2015-05-17 DIAGNOSIS — M79604 Pain in right leg: Secondary | ICD-10-CM | POA: Diagnosis present

## 2015-05-17 DIAGNOSIS — M5136 Other intervertebral disc degeneration, lumbar region: Secondary | ICD-10-CM

## 2015-05-17 DIAGNOSIS — M533 Sacrococcygeal disorders, not elsewhere classified: Secondary | ICD-10-CM

## 2015-05-17 DIAGNOSIS — M79605 Pain in left leg: Secondary | ICD-10-CM | POA: Diagnosis present

## 2015-05-17 DIAGNOSIS — Z9889 Other specified postprocedural states: Secondary | ICD-10-CM | POA: Diagnosis not present

## 2015-05-17 DIAGNOSIS — E1340 Other specified diabetes mellitus with diabetic neuropathy, unspecified: Secondary | ICD-10-CM

## 2015-05-17 DIAGNOSIS — M47816 Spondylosis without myelopathy or radiculopathy, lumbar region: Secondary | ICD-10-CM

## 2015-05-17 MED ORDER — BUPIVACAINE HCL (PF) 0.25 % IJ SOLN
INTRAMUSCULAR | Status: AC
  Administered 2015-05-17: 30 mL
  Filled 2015-05-17: qty 30

## 2015-05-17 MED ORDER — LIDOCAINE HCL (PF) 1 % IJ SOLN
INTRAMUSCULAR | Status: AC
  Administered 2015-05-17: 5 mL
  Filled 2015-05-17: qty 5

## 2015-05-17 MED ORDER — SODIUM CHLORIDE 0.9 % IJ SOLN
INTRAMUSCULAR | Status: AC
  Administered 2015-05-17: 10 mL
  Filled 2015-05-17: qty 20

## 2015-05-17 MED ORDER — TRIAMCINOLONE ACETONIDE 40 MG/ML IJ SUSP
INTRAMUSCULAR | Status: AC
  Administered 2015-05-17: 40 mg
  Filled 2015-05-17: qty 1

## 2015-05-17 MED ORDER — FENTANYL CITRATE (PF) 100 MCG/2ML IJ SOLN
INTRAMUSCULAR | Status: AC
  Administered 2015-05-17: 100 ug via INTRAVENOUS
  Filled 2015-05-17: qty 2

## 2015-05-17 MED ORDER — MIDAZOLAM HCL 5 MG/5ML IJ SOLN
INTRAMUSCULAR | Status: AC
  Administered 2015-05-17: 2 mg via INTRAVENOUS
  Filled 2015-05-17: qty 5

## 2015-05-17 MED ORDER — ORPHENADRINE CITRATE 30 MG/ML IJ SOLN
INTRAMUSCULAR | Status: AC
  Administered 2015-05-17: 60 mg
  Filled 2015-05-17: qty 2

## 2015-05-17 MED ORDER — CEFUROXIME AXETIL 250 MG PO TABS: 250.0000 mg | ORAL_TABLET | Freq: Two times a day (BID) | ORAL | Status: DC

## 2015-05-17 NOTE — Patient Instructions (Addendum)
Continue present medications and antibioticcs  F/U PCP for evaliation of  BP and general medical  condition.  F/U surgical evaluation.  F/U nrurological evaluation.  May consider radiofrequency rhizolysis or intraspinal procedures pending response to present treatment and F/U evaluation.  Patient to call [facility] should patient have concerns prior to scheduled return appointment.  GENERAL RISKS AND COMPLICATIONS  What are the risk, side effects and possible complications? Generally speaking, most procedures are safe.  However, with any procedure there are risks, side effects, and the possibility of complications.  The risks and complications are dependent upon the sites that are lesioned, or the type of nerve block to be performed.  The closer the procedure is to the spine, the more serious the risks are.  Great care is taken when placing the radio frequency needles, block needles or lesioning probes, but sometimes complications can occur. 1. Infection: Any time there is an injection through the skin, there is a risk of infection.  This is why sterile conditions are used for these blocks.  There are four possible types of infection. 1. Localized skin infection. 2. Central Nervous System Infection-This can be in the form of Meningitis, which can be deadly. 3. Epidural Infections-This can be in the form of an epidural abscess, which can cause pressure inside of the spine, causing compression of the spinal cord with subsequent paralysis. This would require an emergency surgery to decompress, and there are no guarantees that the patient would recover from the paralysis. 4. Discitis-This is an infection of the intervertebral discs.  It occurs in about 1% of discography procedures.  It is difficult to treat and it may lead to surgery.        2. Pain: the needles have to go through skin and soft tissues, will cause soreness.       3. Damage to internal structures:  The nerves to be  lesioned may be near blood vessels or    other nerves which can be potentially damaged.       4. Bleeding: Bleeding is more common if the patient is taking blood thinners such as  aspirin, Coumadin, Ticiid, Plavix, etc., or if he/she have some genetic predisposition  such as hemophilia. Bleeding into the spinal canal can cause compression of the spinal  cord with subsequent paralysis.  This would require an emergency surgery to  decompress and there are no guarantees that the patient would recover from the  paralysis.       5. Pneumothorax:  Puncturing of a lung is a possibility, every time a needle is introduced in  the area of the chest or upper back.  Pneumothorax refers to free air around the  collapsed lung(s), inside of the thoracic cavity (chest cavity).  Another two possible  complications related to a similar event would include: Hemothorax and Chylothorax.   These are variations of the Pneumothorax, where instead of air around the collapsed  lung(s), you may have blood or chyle, respectively.       6. Spinal headaches: They may occur with any procedures in the area of the spine.       7. Persistent CSF (Cerebro-Spinal Fluid) leakage: This is a rare problem, but may occur  with prolonged intrathecal or epidural catheters either due to the formation of a fistulous  track or a dural tear.       8. Nerve damage: By working so close to the spinal cord, there is always a possibility of  nerve damage,  which could be as serious as a permanent spinal cord injury with  paralysis.       9. Death:  Although rare, severe deadly allergic reactions known as "Anaphylactic  reaction" can occur to any of the medications used.      10. Worsening of the symptoms:  We can always make thing worse.  What are the chances of something like this happening? Chances of any of this occuring are extremely low.  By statistics, you have more of a chance of getting killed in a motor vehicle accident: while driving to the  hospital than any of the above occurring .  Nevertheless, you should be aware that they are possibilities.  In general, it is similar to taking a shower.  Everybody knows that you can slip, hit your head and get killed.  Does that mean that you should not shower again?  Nevertheless always keep in mind that statistics do not mean anything if you happen to be on the wrong side of them.  Even if a procedure has a 1 (one) in a 1,000,000 (million) chance of going wrong, it you happen to be that one..Also, keep in mind that by statistics, you have more of a chance of having something go wrong when taking medications.  Who should not have this procedure? If you are on a blood thinning medication (e.g. Coumadin, Plavix, see list of "Blood Thinners"), or if you have an active infection going on, you should not have the procedure.  If you are taking any blood thinners, please inform your physician.  How should I prepare for this procedure?  Do not eat or drink anything at least six hours prior to the procedure.  Bring a driver with you .  It cannot be a taxi.  Come accompanied by an adult that can drive you back, and that is strong enough to help you if your legs get weak or numb from the local anesthetic.  Take all of your medicines the morning of the procedure with just enough water to swallow them.  If you have diabetes, make sure that you are scheduled to have your procedure done first thing in the morning, whenever possible.  If you have diabetes, take only half of your insulin dose and notify our nurse that you have done so as soon as you arrive at the clinic.  If you are diabetic, but only take blood sugar pills (oral hypoglycemic), then do not take them on the morning of your procedure.  You may take them after you have had the procedure.  Do not take aspirin or any aspirin-containing medications, at least eleven (11) days prior to the procedure.  They may prolong bleeding.  Wear loose fitting  clothing that may be easy to take off and that you would not mind if it got stained with Betadine or blood.  Do not wear any jewelry or perfume  Remove any nail coloring.  It will interfere with some of our monitoring equipment.  NOTE: Remember that this is not meant to be interpreted as a complete list of all possible complications.  Unforeseen problems may occur.  BLOOD THINNERS The following drugs contain aspirin or other products, which can cause increased bleeding during surgery and should not be taken for 2 weeks prior to and 1 week after surgery.  If you should need take something for relief of minor pain, you may take acetaminophen which is found in Tylenol,m Datril, Anacin-3 and Panadol. It is not blood thinner. The  products listed below are.  Do not take any of the products listed below in addition to any listed on your instruction sheet.  A.P.C or A.P.C with Codeine Codeine Phosphate Capsules #3 Ibuprofen Ridaura  ABC compound Congesprin Imuran rimadil  Advil Cope Indocin Robaxisal  Alka-Seltzer Effervescent Pain Reliever and Antacid Coricidin or Coricidin-D  Indomethacin Rufen  Alka-Seltzer plus Cold Medicine Cosprin Ketoprofen S-A-C Tablets  Anacin Analgesic Tablets or Capsules Coumadin Korlgesic Salflex  Anacin Extra Strength Analgesic tablets or capsules CP-2 Tablets Lanoril Salicylate  Anaprox Cuprimine Capsules Levenox Salocol  Anexsia-D Dalteparin Magan Salsalate  Anodynos Darvon compound Magnesium Salicylate Sine-off  Ansaid Dasin Capsules Magsal Sodium Salicylate  Anturane Depen Capsules Marnal Soma  APF Arthritis pain formula Dewitt's Pills Measurin Stanback  Argesic Dia-Gesic Meclofenamic Sulfinpyrazone  Arthritis Bayer Timed Release Aspirin Diclofenac Meclomen Sulindac  Arthritis pain formula Anacin Dicumarol Medipren Supac  Analgesic (Safety coated) Arthralgen Diffunasal Mefanamic Suprofen  Arthritis Strength Bufferin Dihydrocodeine Mepro Compound Suprol   Arthropan liquid Dopirydamole Methcarbomol with Aspirin Synalgos  ASA tablets/Enseals Disalcid Micrainin Tagament  Ascriptin Doan's Midol Talwin  Ascriptin A/D Dolene Mobidin Tanderil  Ascriptin Extra Strength Dolobid Moblgesic Ticlid  Ascriptin with Codeine Doloprin or Doloprin with Codeine Momentum Tolectin  Asperbuf Duoprin Mono-gesic Trendar  Aspergum Duradyne Motrin or Motrin IB Triminicin  Aspirin plain, buffered or enteric coated Durasal Myochrisine Trigesic  Aspirin Suppositories Easprin Nalfon Trillsate  Aspirin with Codeine Ecotrin Regular or Extra Strength Naprosyn Uracel  Atromid-S Efficin Naproxen Ursinus  Auranofin Capsules Elmiron Neocylate Vanquish  Axotal Emagrin Norgesic Verin  Azathioprine Empirin or Empirin with Codeine Normiflo Vitamin E  Azolid Emprazil Nuprin Voltaren  Bayer Aspirin plain, buffered or children's or timed BC Tablets or powders Encaprin Orgaran Warfarin Sodium  Buff-a-Comp Enoxaparin Orudis Zorpin  Buff-a-Comp with Codeine Equegesic Os-Cal-Gesic   Buffaprin Excedrin plain, buffered or Extra Strength Oxalid   Bufferin Arthritis Strength Feldene Oxphenbutazone   Bufferin plain or Extra Strength Feldene Capsules Oxycodone with Aspirin   Bufferin with Codeine Fenoprofen Fenoprofen Pabalate or Pabalate-SF   Buffets II Flogesic Panagesic   Buffinol plain or Extra Strength Florinal or Florinal with Codeine Panwarfarin   Buf-Tabs Flurbiprofen Penicillamine   Butalbital Compound Four-way cold tablets Penicillin   Butazolidin Fragmin Pepto-Bismol   Carbenicillin Geminisyn Percodan   Carna Arthritis Reliever Geopen Persantine   Carprofen Gold's salt Persistin   Chloramphenicol Goody's Phenylbutazone   Chloromycetin Haltrain Piroxlcam   Clmetidine heparin Plaquenil   Cllnoril Hyco-pap Ponstel   Clofibrate Hydroxy chloroquine Propoxyphen         Before stopping any of these medications, be sure to consult the physician who ordered them.  Some, such as  Coumadin (Warfarin) are ordered to prevent or treat serious conditions such as "deep thrombosis", "pumonary embolisms", and other heart problems.  The amount of time that you may need off of the medication may also vary with the medication and the reason for which you were taking it.  If you are taking any of these medications, please make sure you notify your pain physician before you undergo any procedures.         Pain Management Discharge Instructions  General Discharge Instructions :  If you need to reach your doctor call: Monday-Friday 8:00 am - 4:00 pm at [phone] or toll free [phone].  After clinic hours [phone] to have operator reach doctor.  Bring all of your medication bottles to all your appointments in the pain clinic.  To cancel or reschedule your appointment with Pain  Management please remember to call 24 hours in advance to avoid a fee.  Refer to the educational materials which you have been given on: General Risks, I had my Procedure. Discharge Instructions, Post Sedation.  Post Procedure Instructions:  The drugs you were given will stay in your system until tomorrow, so for the next 24 hours you should not drive, make any legal decisions or drink any alcoholic beverages.  You may eat anything you prefer, but it is better to start with liquids then soups and crackers, and gradually work up to solid foods.  Please notify your doctor immediately if you have any unusual bleeding, trouble breathing or pain that is not related to your normal pain.  Depending on the type of procedure that was done, some parts of your body may feel week and/or numb.  This usually clears up by tonight or the next day.  Walk with the use of an assistive device or accompanied by an adult for the 24 hours.  You may use ice on the affected area for the first 24 hours.  Put ice in a Ziploc bag and cover with a towel and place against area 15 minutes on 15 minutes off.  You may  switch to heat after 24 hours.

## 2015-05-17 NOTE — Progress Notes (Signed)
   Subjective:    Patient ID: Yvette RakersMary L Collier, female    DOB: 09-Jun-1941, 74 y.o.   MRN: [id number]  HPI  PROCEDURE PERFORMED: Lumbar epidural steroid injection   NOTE: The patient is a 74 y.o. female who returns to [facility] for further evaluation and treatment of pain involving the lumbar and lower extremity region. MRI revealed the patient to be with postsurgical changes lumbar region L4-5 level.. The risks, benefits, and expectations of the procedure have been discussed and explained to the patient who was understanding and in agreement with suggested treatment plan. We will proceed with interventional treatment as discussed and explained to the patient who is willing to proceed with procedure as planned.   DESCRIPTION OF PROCEDURE: Lumbar epidural steroid injection with IV Versed, IV fentanyl conscious sedation, EKG, blood pressure, pulse, and pulse oximetry monitoring. The procedure was performed with the patient in the prone position under fluoroscopic guidance. A local anesthetic skin wheal of 1.5% plain lidocaine was accomplished at proposed entry site. An 18-gauge Tuohy epidural needle was inserted at the L 3t vertebral body level left of the midline via loss-of-resistance technique with negative heme and negative CSF return. A total of 4 mL of Preservative-Free normal saline with 40 mg of Kenalog injected incrementally via epidurally placed needle. Needle removed. The patient tolerated the injection well.   PLAN:   1. Medications: We will continue presently prescribed medications. 2. Will consider modification of treatment regimen pending response to treatment rendered on today's visit and follow-up evaluation. 3. The patient is to follow-up with primary care physician regarding blood pressure and general medical condition status post lumbar epidural steroid injection performed on today's visit. 4. Surgical evaluation. 5. Neurological evaluation. 6. The patient may be a  candidate for radiofrequency procedures, implantation device, and other treatment pending response to treatment and follow-up evaluation. 7. The patient has been advised to adhere to proper body mechanics and avoid activities which appear to aggravate condition. 8. The patient has been advised to call the [facility] prior to scheduled return appointment should there be significant change in condition or should there be significant  1. Medications: We will continue presently prescribed medications.  2. Will consider modification of treatment regimen pending response to treatment rendered on today's visit and follow-up evaluation.  3. The patient is to follow-up with primary care physician regarding blood pressure and general medical condition status post lumbar epidural steroid injection performed on today's visit.  4. Surgical evaluation.  5. Neurological evaluation. 6. The patient may be a candidate for radiofrequency procedures, implantation device, and other treatment pending response to treatment and follow-up evaluation.  7. The patient has been advised to adhere to proper body mechanics and avoid activities which appear to aggravate condition.  8. The patient has been advised to call the [facility] prior to scheduled return appointment should there be significant change in condition or should should patient have other concerns regarding condition prior to scheduled return appointment.  The patient is understanding and in agreement with suggested treatment plan.       Review of Systems     Objective:   Physical Exam        Assessment & Plan:

## 2015-05-17 NOTE — Progress Notes (Signed)
Discharged to home via wheelchair. Tolerating liquids. Discharge instructions given. Verbalizes understanding. Teach back x3.

## 2015-05-17 NOTE — Progress Notes (Signed)
   Subjective:    Patient ID: Yvette RakersMary L Burmaster, female    DOB: 1941-10-10, 74 y.o.   MRN: [id number]  HPI    Review of Systems     Objective:   Physical Exam        Assessment & Plan:

## 2015-05-17 NOTE — Progress Notes (Signed)
IV discontinued. Site without redness or swelling. Catheter intact.

## 2015-05-18 ENCOUNTER — Ambulatory Visit: Payer: Medicare Other | Admitting: Cardiovascular Disease

## 2015-05-18 ENCOUNTER — Telehealth: Payer: Self-pay | Admitting: *Deleted

## 2015-05-18 NOTE — Telephone Encounter (Signed)
Denies any problems/concerns post procedure. 

## 2015-05-25 ENCOUNTER — Ambulatory Visit (INDEPENDENT_AMBULATORY_CARE_PROVIDER_SITE_OTHER): Payer: Medicare Other | Admitting: Cardiovascular Disease

## 2015-05-25 ENCOUNTER — Encounter: Payer: Self-pay | Admitting: Cardiovascular Disease

## 2015-05-25 VITALS — BP 152/80 | HR 44 | Ht 65.0 in | Wt 154.0 lb

## 2015-05-25 DIAGNOSIS — I2510 Atherosclerotic heart disease of native coronary artery without angina pectoris: Secondary | ICD-10-CM

## 2015-05-25 DIAGNOSIS — I480 Paroxysmal atrial fibrillation: Secondary | ICD-10-CM | POA: Diagnosis not present

## 2015-05-25 DIAGNOSIS — R0602 Shortness of breath: Secondary | ICD-10-CM | POA: Diagnosis not present

## 2015-05-25 DIAGNOSIS — E118 Type 2 diabetes mellitus with unspecified complications: Secondary | ICD-10-CM

## 2015-05-25 DIAGNOSIS — R079 Chest pain, unspecified: Secondary | ICD-10-CM

## 2015-05-25 DIAGNOSIS — E785 Hyperlipidemia, unspecified: Secondary | ICD-10-CM

## 2015-05-25 DIAGNOSIS — I10 Essential (primary) hypertension: Secondary | ICD-10-CM

## 2015-05-25 MED ORDER — METOPROLOL SUCCINATE ER 50 MG PO TB24: 50.0000 mg | ORAL_TABLET | Freq: Every evening | ORAL | Status: DC

## 2015-05-25 MED ORDER — DILTIAZEM HCL ER COATED BEADS 120 MG PO CP24: 120.0000 mg | ORAL_CAPSULE | Freq: Every day | ORAL | Status: DC

## 2015-05-25 NOTE — Patient Instructions (Addendum)
Your heart rate is slow Please take the diltiazem in the morning Please take the metoprolol succinate in the PM  Hold off on the metoprolol tartrate (25mg )  Monitor your heart rate Call the office if it continues to run in the 40s, I prefer the 50s  Please call us if you have new issues that need to be addressed before your next appt.  Your physician wants you to follow-up in: 6 months.  You will receive a reminder letter in the mail two months in advance. If you don't receive a letter, please call our office to schedule the follow-up appointment.

## 2015-05-25 NOTE — Assessment & Plan Note (Signed)
Medication changes as mentioned above. Heart rate is slow. We'll hold metoprolol tartrate and monitor

## 2015-05-25 NOTE — Assessment & Plan Note (Signed)
Currently with no symptoms of angina. No further workup at this time. Continue current medication regimen. 

## 2015-05-25 NOTE — Addendum Note (Signed)
Addended by: Antonieta IbaGOLLAN, TIMOTHY J on: 05/25/2015 01:43 PM   Modules accepted: Level of Service

## 2015-05-25 NOTE — Assessment & Plan Note (Signed)
Heart rate is very slow today and she has significant fatigue. Recommended she hold the metoprolol tartrate Suggested she continue the diltiazem in the morning, changed to metoprolol succinate to evening Recommended she monitor her heart rate. If this continues to run slow, we'll need to decrease metoprolol succinate down to 25 mg daily in the evening

## 2015-05-25 NOTE — Assessment & Plan Note (Signed)
Encouraged her to stay on her simvastatin 

## 2015-05-25 NOTE — Assessment & Plan Note (Signed)
We have encouraged continued exercise, careful diet management in an effort to lose weight. Recent hemoglobin A1c 7.7. Stressed the importance of aggressive diet

## 2015-05-25 NOTE — Progress Notes (Signed)
Patient ID: Yvette Collier, female    DOB: 1941/07/25, 74 y.o.   MRN: [id number]  HPI Comments: 74 y.o. female with a history of significant GI issues including ischemic colitis, recent hospital admission 09/15/2014 for GI complications, noted to have atrial fibrillation 09/22/14. She presents for routine followup of her atrial fibrillation She has a history of catheterization at [facility] June 2010 showing minimal coronary artery disease, history of anemia, back surgery 3 lasting 2004, left hip replacement 2012  In follow-up today, she reports significant stressors. She is a Education officer, environmental of a church. Recent arguments in the church with some of her patrons. She is not sleeping well. She denies any significant chest pain. Just has fatigue. She has very rare palpitations. Problems with IBS  EKG on today's visit shows sinus bradycardia rate for a slow at 44 bpm with no significant ST or T-wave changes  Other past medical history hospital admission to [facility] from 9/24-10/3 for diarrhea, segmental colitis c/w ischemic colitis who developed a 2 short runs of a-fib on the evening of 10/1, then went back into NSR.   It was felt that her a-fib was 2/2 Suspect secondary to multiple issues: ABD pain, distress, IVF and cardiac stretch. Electrolytes were ok.   Previous CT abdomen showed  segmental colitis c/w ischemic colitis, though perhaps a small vessel disease and not an urgent surgical issue per GI.   Echo showed EF 60-65%, normal global left ventricular systolic function, impaired relaxation pattern of LV diastolic filling, normal right ventricular size and systolic function, normal RVSP.  Allergies  Allergen Reactions  . Effexor [Venlafaxine] Hives  . Ivp Dye [Iodinated Diagnostic Agents] Hives  . Sulfa Antibiotics Rash    Outpatient Encounter Prescriptions as of 05/25/2015  Medication Sig  . aspirin 81 MG tablet Take 81 mg by mouth daily.  . cefUROXime (CEFTIN) 250 MG tablet Take 1 tablet (250 mg total) by  mouth 2 (two) times daily with a meal.  . DEXILANT 60 MG capsule Take 60 mg by mouth daily.   Marland Kitchen dicyclomine (BENTYL) 20 MG tablet Take 20 mg by mouth 2 (two) times daily.  Marland Kitchen diltiazem (CARDIZEM CD) 120 MG 24 hr capsule Take 120 mg by mouth daily.   . isosorbide mononitrate (IMDUR) 30 MG 24 hr tablet Take 30 mg by mouth daily.   Marland Kitchen lisinopril (PRINIVIL,ZESTRIL) 10 MG tablet Take 10 mg by mouth daily.  . metoprolol succinate (TOPROL-XL) 50 MG 24 hr tablet Take 50 mg by mouth daily. Take with or immediately following a meal.  . metoprolol tartrate (LOPRESSOR) 25 MG tablet Take 1 tablet (25 mg total) by mouth 2 (two) times daily as needed. For atrial fibrillation  . oxyCODONE (OXY IR/ROXICODONE) 5 MG immediate release tablet Limited 1 tablet by mouth 1-3 times per day if tolerated  . pregabalin (LYRICA) 25 MG capsule Limit 1 tablet by mouth per day if tolerated  . simvastatin (ZOCOR) 20 MG tablet Take 1 tablet by mouth daily.  . [DISCONTINUED] lisinopril (PRINIVIL,ZESTRIL) 2.5 MG tablet Take 2.5 mg by mouth daily.    No facility-administered encounter medications on file as of 05/25/2015.    Past Medical History  Diagnosis Date  . GERD (gastroesophageal reflux disease)   . Asthma   . Diabetes mellitus without complication   . Heart murmur   . IBS (irritable bowel syndrome)   . History of colon polyps   . Hypertension   . Osteoarthritis   . Chronic lower back pain   .  Ischemic colitis   . Gastrointestinal bleed   . COPD (chronic obstructive pulmonary disease)   . Transient cerebral ischemia due to atrial fibrillation   . CAD (coronary artery disease)   . PAF (paroxysmal atrial fibrillation)     a. 09/2014    Past Surgical History  Procedure Laterality Date  . Foot surgery Right   . Stomach surgery    . Abdominal hysterectomy    . Hemorrhoid banding    . Total hip arthroplasty    . Back surgery    . Cholecystectomy    . Appendectomy    . Tonsillectomy      Social History   reports that she has quit smoking. She has never used smokeless tobacco. She reports that she does not drink alcohol or use illicit drugs.  Family History family history includes Heart attack in her mother; Heart disease in her father; Hypertension in her brother, father, maternal aunt, and mother.   Review of Systems  Constitutional: Positive for fatigue.  Respiratory: Negative.   Cardiovascular: Negative.   Gastrointestinal: Positive for abdominal pain.  Musculoskeletal: Negative.   Skin: Negative.   Neurological: Negative.   Hematological: Negative.   Psychiatric/Behavioral: The patient is nervous/anxious.   All other systems reviewed and are negative.   BP 152/80 mmHg  Pulse 44  Ht 5\' 5"  (1.651 m)  Wt 154 lb (69.854 kg)  BMI 25.63 kg/m2  Physical Exam  Constitutional: She is oriented to person, place, and time. She appears well-developed and well-nourished.  HENT:  Head: Normocephalic.  Nose: Nose normal.  Mouth/Throat: Oropharynx is clear and moist.  Eyes: Conjunctivae are normal. Pupils are equal, round, and reactive to light.  Neck: Normal range of motion. Neck supple. No JVD present.  Cardiovascular: Regular rhythm, S1 normal, S2 normal, normal heart sounds and intact distal pulses.  Bradycardia present.  Exam reveals no gallop and no friction rub.   No murmur heard. Pulmonary/Chest: Effort normal and breath sounds normal. No respiratory distress. She has no wheezes. She has no rales. She exhibits no tenderness.  Abdominal: Soft. Bowel sounds are normal. She exhibits no distension. There is no tenderness.  Musculoskeletal: Normal range of motion. She exhibits no edema or tenderness.  Lymphadenopathy:    She has no cervical adenopathy.  Neurological: She is alert and oriented to person, place, and time. Coordination normal.  Skin: Skin is warm and dry. No rash noted. No erythema.  Psychiatric: She has a normal mood and affect. Her behavior is normal. Judgment and  thought content normal.    Assessment and Plan  Nursing note and vitals reviewed.

## 2015-06-12 ENCOUNTER — Ambulatory Visit: Payer: Medicare Other | Attending: Pain Medicine | Admitting: Pain Medicine

## 2015-06-12 ENCOUNTER — Encounter: Payer: Self-pay | Admitting: Pain Medicine

## 2015-06-12 VITALS — BP 131/96 | HR 48 | Temp 99.1°F | Resp 15 | Ht 65.0 in | Wt 152.0 lb

## 2015-06-12 DIAGNOSIS — M5136 Other intervertebral disc degeneration, lumbar region: Secondary | ICD-10-CM

## 2015-06-12 DIAGNOSIS — M961 Postlaminectomy syndrome, not elsewhere classified: Secondary | ICD-10-CM

## 2015-06-12 DIAGNOSIS — M533 Sacrococcygeal disorders, not elsewhere classified: Secondary | ICD-10-CM | POA: Insufficient documentation

## 2015-06-12 DIAGNOSIS — M47896 Other spondylosis, lumbar region: Secondary | ICD-10-CM | POA: Diagnosis not present

## 2015-06-12 DIAGNOSIS — E1340 Other specified diabetes mellitus with diabetic neuropathy, unspecified: Secondary | ICD-10-CM

## 2015-06-12 DIAGNOSIS — M545 Low back pain: Secondary | ICD-10-CM | POA: Diagnosis present

## 2015-06-12 DIAGNOSIS — M47816 Spondylosis without myelopathy or radiculopathy, lumbar region: Secondary | ICD-10-CM

## 2015-06-12 DIAGNOSIS — M48062 Spinal stenosis, lumbar region with neurogenic claudication: Secondary | ICD-10-CM

## 2015-06-12 MED ORDER — OXYCODONE HCL 5 MG PO TABS: ORAL_TABLET | ORAL | Status: DC

## 2015-06-12 NOTE — Progress Notes (Signed)
Safety precautions to be maintained throughout the outpatient stay will include: orient to surroundings, keep bed in low position, maintain call bell within reach at all times, provide assistance with transfer out of bed and ambulation. Discharge ambulatory at 8:10 am

## 2015-06-12 NOTE — Progress Notes (Signed)
   Subjective:    Patient ID: Yvette Collier, female    DOB: 28-Nov-1941, 74 y.o.   MRN: [id number]  HPI  Patient is 74 year old female [facility] evaluation and treatment of pain involving the lower back lower extremity regions. Patient states she has significant muscle spasms occurring in the mid and lower back region aggravated by standing and walking. Patient has had improvement with prior interventional treatment with less pain involving the hips and buttocks at this time. Patient is without plans for additional surgical intervention of the lumbar region. We will proceed with lumbosacral selective nerve block at time return appointment in attempt to decrease severity of patient's symptoms minimize progression of patient's symptoms, and, and minimize the need for more involved treatment.. The patient was understanding and agreement with suggested treatment plan.      Review of Systems     Objective:   Physical Exam There was tends to palpation of paraspinal musculature region cervical region cervical facet region of mild degree with mild tenderness of the splenius capitis and occipitalis musculature regions. There was tenderness over the region of the cervical facet and cervical paraspinal musculature regions and unremarkable Spurling's maneuver. Tinel and Phalen's maneuver were without increase of pain of significant degree. Palpation over the thoracic facet thoracic paraspinal musculature region was associated with increased pain of moderate degree in the lower thoracic region with moderate muscle spasms. Palpation over the lumbar paraspinal muscles lumbar facet region was associated with increased pain of moderate moderately severe degree with lateral bending and rotation reproducing moderate to moderately severe discomfort. Straight leg raising was limited to 20 without definite increase of pain with dorsiflexion noted. There was negative clonus negative  Homans. Mild tenderness of the greater trochanteric region iliotibial band region. Abdomen nontender and no costovertebral angle tenderness noted.     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  F/U surgical evaluation.  F/U neurological evaluation.  May consider radiofrequency rhizolysis or intraspinal procedures pending response to present treatment and F/U evaluation.  Patient to call [facility] should patient have concerns prior to scheduled return appointment.

## 2015-06-12 NOTE — Patient Instructions (Addendum)
Continue present medications.  Lumbosacral selective nerve root block Monday, 06/19/2015  F/U PCP for evaliation of  BP and general medical  condition.  F/U surgical evaluation.  F/U neurological evaluation.  May consider radiofrequency rhizolysis or intraspinal procedures pending response to present treatment and F/U evaluation.  Patient to call [facility] should patient have concerns prior to scheduled return appointment.   Selective Nerve Root Block Patient Information  Description: Specific nerve roots exit the spinal canal and these nerves can be compressed and inflamed by a bulging disc and bone spurs.  By injecting steroids on the nerve root, we can potentially decrease the inflammation surrounding these nerves, which often leads to decreased pain.  Also, by injecting local anesthesia on the nerve root, this can provide [place] helpful information to give to your referring doctor if it decreases your pain.  Selective nerve root blocks can be done along the spine from the neck to the low back depending on the location of your pain.   After numbing the skin with local anesthesia, a small needle is passed to the nerve root and the position of the needle is verified using x-ray pictures.  After the needle is in correct position, we then deposit the medication.  You may experience a pressure sensation while this is being done.  The entire block usually lasts less than 15 minutes.  Conditions that may be treated with selective nerve root blocks:  Low back and leg pain  Spinal stenosis  Diagnostic block prior to potential surgery  Neck and arm pain  Post laminectomy syndrome  Preparation for the injection:  1. Do not eat any solid food or dairy products within 6 hours of your appointment. 2. You may drink clear liquids up to 2 hours before an appointment.  Clear liquids include water, black coffee, juice or soda.  No milk or cream please. 3. You may take your regular  medications, including pain medications, with a sip of water before your appointment.  Diabetics should hold regular insulin (if taken separately) and take 1/2 normal NPH dose the morning of the procedure.  Carry some sugar containing items with you to your appointment. 4. A driver must accompany you and be prepared to drive you home after your procedure. 5. Bring all your current medications with you. 6. An IV may be inserted and sedation may be given at the discretion of the physician. 7. A blood pressure cuff, EKG, and other monitors will often be applied during the procedure.  Some patients may need to have extra oxygen administered for a short period. 8. You will be asked to provide medical information, including allergies, prior to the procedure.  We must know immediately if you are taking blood  Thinners (like Coumadin) or if you are allergic to IV iodine contrast (dye).  Possible side-effects: All are usually temporary  Bleeding from needle site  Light headedness  Numbness and tingling  Decreased blood pressure  Weakness in arms/legs  Pressure sensation in back/neck  Pain at injection site (several days)  Possible complications: All are extremely rare  Infection  Nerve injury  Spinal headache (a headache wore with upright position)  Call if you experience:  Fever/chills associated with headache or increased back/neck pain  Headache worsened by an upright position  New onset weakness or numbness of an extremity below the injection site  Hives or difficulty breathing (go to the emergency room)  Inflammation or drainage at the injection site(s)  Severe back/neck pain greater than  usual  New symptoms which are concerning to you  Please note:  Although the local anesthetic injected can often make your back or neck feel good for several hours after the injection the pain will likely return.  It takes 3-5 days for steroids to work on the nerve root. You may not  notice any pain relief for at least one week.  If effective, we will often do a series of 3 injections spaced 3-6 weeks apart to maximally decrease your pain.    If you have any questions, please call [phone] [facility]  A prescription for OXYCODONE was given to you today.

## 2015-06-19 ENCOUNTER — Ambulatory Visit: Payer: Medicare Other | Attending: Pain Medicine | Admitting: Pain Medicine

## 2015-06-19 ENCOUNTER — Encounter: Payer: Self-pay | Admitting: Pain Medicine

## 2015-06-19 VITALS — BP 121/50 | HR 49 | Resp 18

## 2015-06-19 DIAGNOSIS — M5416 Radiculopathy, lumbar region: Secondary | ICD-10-CM

## 2015-06-19 DIAGNOSIS — M533 Sacrococcygeal disorders, not elsewhere classified: Secondary | ICD-10-CM

## 2015-06-19 DIAGNOSIS — M79604 Pain in right leg: Secondary | ICD-10-CM | POA: Diagnosis present

## 2015-06-19 DIAGNOSIS — M47816 Spondylosis without myelopathy or radiculopathy, lumbar region: Secondary | ICD-10-CM | POA: Insufficient documentation

## 2015-06-19 DIAGNOSIS — Z9889 Other specified postprocedural states: Secondary | ICD-10-CM

## 2015-06-19 DIAGNOSIS — M961 Postlaminectomy syndrome, not elsewhere classified: Secondary | ICD-10-CM

## 2015-06-19 DIAGNOSIS — M48062 Spinal stenosis, lumbar region with neurogenic claudication: Secondary | ICD-10-CM

## 2015-06-19 DIAGNOSIS — M5136 Other intervertebral disc degeneration, lumbar region: Secondary | ICD-10-CM

## 2015-06-19 DIAGNOSIS — M545 Low back pain: Secondary | ICD-10-CM | POA: Diagnosis present

## 2015-06-19 DIAGNOSIS — E1340 Other specified diabetes mellitus with diabetic neuropathy, unspecified: Secondary | ICD-10-CM

## 2015-06-19 DIAGNOSIS — M79605 Pain in left leg: Secondary | ICD-10-CM | POA: Diagnosis present

## 2015-06-19 DIAGNOSIS — Z981 Arthrodesis status: Secondary | ICD-10-CM | POA: Insufficient documentation

## 2015-06-19 MED ORDER — BUPIVACAINE HCL (PF) 0.25 % IJ SOLN
INTRAMUSCULAR | Status: AC
  Administered 2015-06-19: 09:00:00
  Filled 2015-06-19: qty 30

## 2015-06-19 MED ORDER — ORPHENADRINE CITRATE 30 MG/ML IJ SOLN
INTRAMUSCULAR | Status: AC
  Administered 2015-06-19: 09:00:00
  Filled 2015-06-19: qty 2

## 2015-06-19 MED ORDER — CEFAZOLIN SODIUM 1 G IJ SOLR
INTRAMUSCULAR | Status: AC
  Administered 2015-06-19: 1 g via INTRAVENOUS
  Filled 2015-06-19: qty 10

## 2015-06-19 MED ORDER — CEFUROXIME AXETIL 250 MG PO TABS: 250.0000 mg | ORAL_TABLET | Freq: Two times a day (BID) | ORAL | Status: DC

## 2015-06-19 MED ORDER — FENTANYL CITRATE (PF) 100 MCG/2ML IJ SOLN
INTRAMUSCULAR | Status: AC
  Administered 2015-06-19: 100 ug via INTRAVENOUS
  Filled 2015-06-19: qty 2

## 2015-06-19 MED ORDER — MIDAZOLAM HCL 5 MG/5ML IJ SOLN
INTRAMUSCULAR | Status: AC
  Administered 2015-06-19: 5 mg via INTRAVENOUS
  Filled 2015-06-19: qty 5

## 2015-06-19 MED ORDER — TRIAMCINOLONE ACETONIDE 40 MG/ML IJ SUSP
INTRAMUSCULAR | Status: AC
  Administered 2015-06-19: 09:00:00
  Filled 2015-06-19: qty 1

## 2015-06-19 NOTE — Progress Notes (Signed)
Safety precautions to be maintained throughout the outpatient stay will include: orient to surroundings, keep bed in low position, maintain call bell within reach at all times, provide assistance with transfer out of bed and ambulation.  

## 2015-06-19 NOTE — Progress Notes (Signed)
Subjective:    Patient ID: Yvette Collier, female    DOB: 23-Apr-1941, 74 y.o.   MRN: [id number]  HPI  PROCEDURE PERFORMED: Lumbosacral selective nerve root block   NOTE: The patient is a 74 y.o. female who [facility] for further evaluation and treatment of pain involving the lumbar and lower extremity region. Studies consisting of MRI revealed prior surgical intervention of the lumbar region with degenerative changes L4-5 level. Spaces at L3-4 and L4-L5 interspaces to be in proper position with bilateral pedicle screws attached to the posterior rods ridge in both sides of L3-L5. Transfers bar is noted to be between the rods at the L3 level. Patient is status post L3-L5 fusion There is concern regarding intraspinal abnormalities contributing to the patient's symptomatology. The risks, benefits, and expectations of the procedure have been explained to the patient who was understanding and in agreement with suggested treatment plan. We will proceed with interventional treatment as discussed and as explained to the patient. The patient is understanding and in agreement with suggested treatment plan.   DESCRIPTION OF PROCEDURE: Lumbosacral selective nerve root block with IV Versed, IV fentanyl conscious sedation, EKG, blood pressure, pulse, and pulse oximetry monitoring. The procedure was performed with the patient in the prone position under fluoroscopic guidance. With the patient in the prone position, Betadine prep of proposed entry site was performed. Local anesthetic skin wheal of proposed needle entry site was prepared with 1.5% plain lidocaine with AP view of the lumbosacral spine.   PROCEDURE #1: Needle placement at the L 2 vertebral body: A 22 -gauge needle was inserted at the inferior border of the transverse process of the vertebral body with needle placed medial to the midline of the transverse process on AP view of the lumbosacral spine.   NEEDLE PLACEMENT AT  L3  and  L4 Needle  placement was accomplished at L3 and L4 vertebral body levels  on the left side exactly as was accomplished at the L2 vertebral body level level utilizing the same technique and under fluoroscopic guidance.  PROCEDURE #4: Needle placement at the S1 foramen. With the patient in the prone position with Betadine prep of proposed entry site accomplished, the S1 foramen was visualized under fluoroscopic guidance with AP view of the lumbosacral spine with cephalad orientation of the fluoroscope with local anesthetic skin wheal of 1.5% lidocaine of proposed needle entry site prepared. A 22-gauge needle was inserted S1 foramen under fluoroscopic guidance eliciting paresthesias radiating from the buttocks to the lower extremity after which needle was slightly withdrawn.   Needle placement was then verified on lateral view at all levels with needle tip documented to be in the posterior superior quadrant of the intervertebral foramen of  L 3, L4, and L5, and needle tip documented at the level of the S1 foramen. Following negative aspiration for heme and CSF at each level, each level was injected with 3 mL of 0.25% bupivacaine with Kenalog.   LUMBOSACRAL SELECTIVE NERVE ROOT BLOCKS THE THE  RIGHT SIDE  The procedure was performed on the right side exactly as was performed on the left side and at the same levels  Under fluoroscopic guidance and utilizing the same technique.    The patient tolerated the procedure well. A total of 10 mg of Kenalog was utilized for the procedure.   PLAN:  1. Medications: Will continue presently prescribed medications Lyrica and oxycodone 2. The patient is to undergo follow-up evaluation with for evaluation of blood pressure  and general medical condition with Dr. Lawerance Bach status post procedure performed on today's visit. 3. Surgical follow-up evaluation with Dr. Wynetta Emery as discussed 4. Neurological evaluation. 5. May consider radiofrequency procedures, implantation type  procedures and other treatment pending response to treatment and follow-up evaluation. 6. The patient has been advise do adhere to proper body mechanics and avoid activities which may aggravate condition. 7. The patient has been advised to call the [facility] prior to scheduled return appointment should there be significant change in the patient's condition or should the patient have other concerns regarding condition prior to scheduled return appointment.    Review of Systems     Objective:   Physical Exam        Assessment & Plan:

## 2015-06-19 NOTE — Patient Instructions (Addendum)
Continue present medications and antibiotics. Please obtain your antibiotic today and begin taking antibiotic today  F/U PCP for evaliation of  BP and general medical  condition.  F/U surgical evaluation as discussed  F/U neurological evaluation.  May consider radiofrequency rhizolysis or intraspinal procedures pending response to present treatment and F/U evaluation.  Patient to call [facility] should patient have concerns prior to scheduled return appointment.    Pain Management Discharge Instructions  General Discharge Instructions :  If you need to reach your doctor call: Monday-Friday 8:00 am - 4:00 pm at [phone] or toll free [phone].  After clinic hours [phone] to have operator reach doctor.  Bring all of your medication bottles to all your appointments in the pain clinic.  To cancel or reschedule your appointment with Pain Management please remember to call 24 hours in advance to avoid a fee.  Refer to the educational materials which you have been given on: General Risks, I had my Procedure. Discharge Instructions, Post Sedation.  Post Procedure Instructions:  The drugs you were given will stay in your system until tomorrow, so for the next 24 hours you should not drive, make any legal decisions or drink any alcoholic beverages.  You may eat anything you prefer, but it is better to start with liquids then soups and crackers, and gradually work up to solid foods.  Please notify your doctor immediately if you have any unusual bleeding, trouble breathing or pain that is not related to your normal pain.  Depending on the type of procedure that was done, some parts of your body may feel week and/or numb.  This usually clears up by tonight or the next day.  Walk with the use of an assistive device or accompanied by an adult for the 24 hours.  You may use ice on the affected area for the first 24 hours.  Put ice in a Ziploc bag and cover with a towel  and place against area 15 minutes on 15 minutes off.  You may switch to heat after 24 hours.  A prescription for CEFTIN was sent to your pharmacy and should be available for pickup today. 

## 2015-06-20 ENCOUNTER — Telehealth: Payer: Self-pay | Admitting: *Deleted

## 2015-06-20 NOTE — Telephone Encounter (Signed)
Denies any problems post procedure

## 2015-06-30 ENCOUNTER — Other Ambulatory Visit: Payer: Self-pay | Admitting: Pain Medicine

## 2015-07-09 ENCOUNTER — Emergency Department: Payer: Medicare Other

## 2015-07-09 ENCOUNTER — Emergency Department
Admission: EM | Admit: 2015-07-09 | Discharge: 2015-07-09 | Disposition: A | Discharge status: Home / Self Care | Payer: Medicare Other | Attending: Emergency Medicine | Admitting: Emergency Medicine

## 2015-07-09 ENCOUNTER — Encounter: Payer: Self-pay | Admitting: Emergency Medicine

## 2015-07-09 DIAGNOSIS — R0602 Shortness of breath: Secondary | ICD-10-CM | POA: Diagnosis present

## 2015-07-09 DIAGNOSIS — E119 Type 2 diabetes mellitus without complications: Secondary | ICD-10-CM | POA: Insufficient documentation

## 2015-07-09 DIAGNOSIS — I10 Essential (primary) hypertension: Secondary | ICD-10-CM | POA: Insufficient documentation

## 2015-07-09 DIAGNOSIS — Z792 Long term (current) use of antibiotics: Secondary | ICD-10-CM | POA: Insufficient documentation

## 2015-07-09 DIAGNOSIS — Z7982 Long term (current) use of aspirin: Secondary | ICD-10-CM | POA: Insufficient documentation

## 2015-07-09 DIAGNOSIS — Z87891 Personal history of nicotine dependence: Secondary | ICD-10-CM | POA: Diagnosis not present

## 2015-07-09 DIAGNOSIS — Z79899 Other long term (current) drug therapy: Secondary | ICD-10-CM | POA: Diagnosis not present

## 2015-07-09 DIAGNOSIS — J159 Unspecified bacterial pneumonia: Secondary | ICD-10-CM | POA: Insufficient documentation

## 2015-07-09 DIAGNOSIS — J189 Pneumonia, unspecified organism: Secondary | ICD-10-CM

## 2015-07-09 LAB — CBC
HEMATOCRIT: 34.9 % — AB (ref 35.0–47.0)
HEMOGLOBIN: 11.6 g/dL — AB (ref 12.0–16.0)
MCH: 30.2 pg (ref 26.0–34.0)
MCHC: 33.2 g/dL (ref 32.0–36.0)
MCV: 90.8 fL (ref 80.0–100.0)
Platelets: 343 10*3/uL (ref 150–440)
RBC: 3.85 MIL/uL (ref 3.80–5.20)
RDW: 13.1 % (ref 11.5–14.5)
WBC: 9.5 10*3/uL (ref 3.6–11.0)

## 2015-07-09 LAB — COMPREHENSIVE METABOLIC PANEL
ALT: 24 U/L (ref 14–54)
AST: 22 U/L (ref 15–41)
Albumin: 3.4 g/dL — ABNORMAL LOW (ref 3.5–5.0)
Alkaline Phosphatase: 227 U/L — ABNORMAL HIGH (ref 38–126)
Anion gap: 13 (ref 5–15)
BUN: 15 mg/dL (ref 6–20)
CHLORIDE: 97 mmol/L — AB (ref 101–111)
CO2: 26 mmol/L (ref 22–32)
Calcium: 9.5 mg/dL (ref 8.9–10.3)
Creatinine, Ser: 1.18 mg/dL — ABNORMAL HIGH (ref 0.44–1.00)
GFR calc Af Amer: 51 mL/min — ABNORMAL LOW (ref >60)
GFR calc non Af Amer: 44 mL/min — ABNORMAL LOW (ref >60)
Glucose, Bld: 204 mg/dL — ABNORMAL HIGH (ref 65–99)
Potassium: 4.5 mmol/L (ref 3.5–5.1)
SODIUM: 136 mmol/L (ref 135–145)
Total Bilirubin: 0.6 mg/dL (ref 0.3–1.2)
Total Protein: 8.6 g/dL — ABNORMAL HIGH (ref 6.5–8.1)

## 2015-07-09 LAB — URINALYSIS COMPLETE WITH MICROSCOPIC (ARMC ONLY)
BACTERIA UA: NONE SEEN
Bilirubin Urine: NEGATIVE
GLUCOSE, UA: NEGATIVE mg/dL
Hgb urine dipstick: NEGATIVE
KETONES UR: NEGATIVE mg/dL
LEUKOCYTES UA: NEGATIVE
Nitrite: NEGATIVE
Protein, ur: NEGATIVE mg/dL
RBC / HPF: NONE SEEN RBC/hpf (ref 0–5)
Specific Gravity, Urine: 1.013 (ref 1.005–1.030)
pH: 5 (ref 5.0–8.0)

## 2015-07-09 LAB — CK: Total CK: 52 U/L (ref 38–234)

## 2015-07-09 LAB — TROPONIN I: Troponin I: <0.03 ng/mL (ref <0.031)

## 2015-07-09 MED ORDER — ACETAMINOPHEN-CODEINE #3 300-30 MG PO TABS
2.0000 | ORAL_TABLET | Freq: Once | ORAL | Status: AC
  Administered 2015-07-09: 2 via ORAL
  Filled 2015-07-09: qty 2

## 2015-07-09 MED ORDER — ACETAMINOPHEN 325 MG PO TABS
650.0000 mg | ORAL_TABLET | Freq: Once | ORAL | Status: AC
  Administered 2015-07-09: 650 mg via ORAL
  Filled 2015-07-09: qty 2

## 2015-07-09 MED ORDER — LEVOFLOXACIN IN D5W 500 MG/100ML IV SOLN
500.0000 mg | Freq: Once | INTRAVENOUS | Status: AC
  Administered 2015-07-09: 500 mg via INTRAVENOUS
  Filled 2015-07-09: qty 100

## 2015-07-09 MED ORDER — ACETAMINOPHEN-CODEINE #3 300-30 MG PO TABS: 2.0000 | ORAL_TABLET | Freq: Three times a day (TID) | ORAL | Status: DC | PRN

## 2015-07-09 MED ORDER — LEVOFLOXACIN 500 MG PO TABS: 500.0000 mg | ORAL_TABLET | Freq: Every day | ORAL | Status: AC

## 2015-07-09 MED ORDER — SODIUM CHLORIDE 0.9 % IV BOLUS (SEPSIS)
1000.0000 mL | Freq: Once | INTRAVENOUS | Status: AC
  Administered 2015-07-09: 1000 mL via INTRAVENOUS

## 2015-07-09 MED ORDER — ALBUTEROL SULFATE (2.5 MG/3ML) 0.083% IN NEBU
2.5000 mg | INHALATION_SOLUTION | Freq: Once | RESPIRATORY_TRACT | Status: AC
  Administered 2015-07-09: 2.5 mg via RESPIRATORY_TRACT
  Filled 2015-07-09: qty 3

## 2015-07-09 NOTE — ED Notes (Signed)
Per EMS: pt presents from home with cough and fever x 1 week. EMS states last night pt's cough and SOB worsened. Pt presents to ED with noted cough. EMS states per FD pt was 92% on RA, and bumped to 99% on 2L. Pt 94% on RA upon arrival.

## 2015-07-09 NOTE — Discharge Instructions (Signed)

## 2015-07-09 NOTE — ED Provider Notes (Signed)
[facility] Emergency Department Provider Note  ____________________________________________  Time seen: 8:35 AM  I have reviewed the triage vital signs and the nursing notes.   HISTORY  Chief Complaint Cough and Shortness of Breath      HPI Yvette Collier is a 73 y.o. female presents with fever cough that is productive and dyspnea times one week.Per EMS initial O2 sat was 89%.     Past Medical History  Diagnosis Date  . GERD (gastroesophageal reflux disease)   . Asthma   . Diabetes mellitus without complication   . Heart murmur   . IBS (irritable bowel syndrome)   . History of colon polyps   . Hypertension   . Osteoarthritis   . Chronic lower back pain   . Ischemic colitis   . Gastrointestinal bleed   . COPD (chronic obstructive pulmonary disease)   . Transient cerebral ischemia due to atrial fibrillation   . CAD (coronary artery disease)   . PAF (paroxysmal atrial fibrillation)     a. 09/2014  . Atrial fibrillation atrial fib    Patient Active Problem List   Diagnosis Date Noted  . Status post lumbar laminectomy 06/19/2015  . Lumbar radiculopathy 06/19/2015  . DDD (degenerative disc disease), lumbar 05/11/2015  . Facet syndrome, lumbar 05/11/2015  . Lumbar post-laminectomy syndrome 05/11/2015  . Sacroiliac joint disease 05/11/2015  . 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 Neuropathy due to secondary diabetes 05/11/2015  . Essential hypertension 01/03/2015  . CAD (coronary artery disease)   . PAF (paroxysmal atrial fibrillation) 10/20/2014  . Ischemic colitis 10/20/2014  . 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 COPD (chronic obstructive pulmonary disease) 10/20/2014  . Hyperlipidemia 10/20/2014  . GERD (gastroesophageal reflux disease) 10/20/2014  . Internal hemorrhoid, bleeding 07/14/2013    Past Surgical History  Procedure Laterality Date  . Foot surgery Right   .  Stomach surgery    . Abdominal hysterectomy    . Hemorrhoid banding    . Total hip arthroplasty    . Back surgery    . Cholecystectomy    . Appendectomy    . Tonsillectomy      Current Outpatient Rx  Name  Route  Sig  Dispense  Refill  . acetaminophen-codeine (TYLENOL #3) 300-30 MG per tablet   Oral   Take 2 tablets by mouth every 8 (eight) hours as needed for moderate pain.   30 tablet   0   . aspirin 81 MG tablet   Oral   Take 81 mg by mouth daily.         . cefUROXime (CEFTIN) 250 MG tablet   Oral   Take 1 tablet (250 mg total) by mouth 2 (two) times daily with a meal. Patient not taking: Reported on 06/12/2015   14 tablet   0   . cefUROXime (CEFTIN) 250 MG tablet   Oral   Take 1 tablet (250 mg total) by mouth 2 (two) times daily with a meal. Patient not taking: Reported on 06/19/2015   14 tablet   0   . DEXILANT 60 MG capsule   Oral   Take 60 mg by mouth daily.          Marland Kitchen dicyclomine (BENTYL) 20 MG tablet   Oral   Take 20 mg by mouth 2 (two) times daily.         Marland Kitchen diltiazem (CARDIZEM CD) 120 MG 24 hr capsule   Oral  Take 1 capsule (120 mg total) by mouth daily after breakfast.   90 capsule   3   . isosorbide mononitrate (IMDUR) 30 MG 24 hr tablet   Oral   Take 30 mg by mouth daily.          Marland Kitchen levofloxacin (LEVAQUIN) 500 MG tablet   Oral   Take 1 tablet (500 mg total) by mouth daily.   10 tablet   0   . lisinopril (PRINIVIL,ZESTRIL) 10 MG tablet   Oral   Take 10 mg by mouth daily.         . metoprolol succinate (TOPROL-XL) 50 MG 24 hr tablet   Oral   Take 1 tablet (50 mg total) by mouth every evening. Take with or immediately following a meal. Patient not taking: Reported on 06/19/2015   90 tablet   3   . metoprolol tartrate (LOPRESSOR) 25 MG tablet   Oral   Take 1 tablet (25 mg total) by mouth 2 (two) times daily as needed. For atrial fibrillation Patient not taking: Reported on 06/12/2015   60 tablet   3   . oxyCODONE (OXY  IR/ROXICODONE) 5 MG immediate release tablet      Limited 1 tablet by mouth 1-3 times per day if tolerated   90 tablet   0   . pregabalin (LYRICA) 25 MG capsule      Limit 1 tablet by mouth per day if tolerated   30 capsule   2   . simvastatin (ZOCOR) 20 MG tablet   Oral   Take 1 tablet by mouth daily.           Allergies Effexor; Ivp dye; and Sulfa antibiotics  Family History  Problem Relation Age of Onset  . Heart attack Mother   . Hypertension Mother   . Heart disease Father   . Hypertension Father   . Hypertension Brother   . Hypertension Maternal Aunt     Social History History  Substance Use Topics  . Smoking status: Former Smoker -- 1.00 packs/day for 1 years  . Smokeless tobacco: Never Used  . Alcohol Use: No    Review of Systems  Constitutional: Positive for fever. Eyes: Negative for visual changes. ENT: Negative for sore throat. Cardiovascular: Negative for chest pain. Respiratory: Positive for shortness of breath. And cough Gastrointestinal: Negative for abdominal pain, vomiting and diarrhea. Genitourinary: Negative for dysuria. Musculoskeletal: Negative for back pain. Skin: Negative for rash. Neurological: Negative for headaches, focal weakness or numbness.   10-point ROS otherwise negative.  ____________________________________________   PHYSICAL EXAM:  VITAL SIGNS: ED Triage Vitals  Enc Vitals Group     BP 07/09/15 0847 123/92 mmHg     Pulse Rate 07/09/15 0847 101     Resp 07/09/15 0847 18     Temp 07/09/15 0847 102.5 F (39.2 C)     Temp Source 07/09/15 0847 Oral     SpO2 07/09/15 0845 92 %     Weight 07/09/15 0847 152 lb (68.947 kg)     Height 07/09/15 0847 5\' 5"  (1.651 m)     Head Cir --      Peak Flow --      Pain Score 07/09/15 0848 5     Pain Loc --      Pain Edu? --      Excl. in GC? --      Constitutional: Alert and oriented. Well appearing and in no distress. Eyes: Conjunctivae are normal. PERRL. Normal  extraocular  movements. ENT   Head: Normocephalic and atraumatic.   Nose: No congestion/rhinnorhea.   Mouth/Throat: Mucous membranes are moist.   Neck: No stridor. Hematological/Lymphatic/Immunilogical: No cervical lymphadenopathy. Cardiovascular: Normal rate, regular rhythm. Normal and symmetric distal pulses are present in all extremities. No murmurs, rubs, or gallops. Respiratory: Normal respiratory effort without tachypnea nor retractions. Breath sounds are clear and equal bilaterally. No wheezes/rales/rhonchi. Gastrointestinal: Soft and nontender. No distention. There is no CVA tenderness. Genitourinary: deferred Musculoskeletal: Nontender with normal range of motion in all extremities. No joint effusions.  No lower extremity tenderness nor edema. Neurologic:  Normal speech and language. No gross focal neurologic deficits are appreciated. Speech is normal.  Skin:  Skin is warm, dry and intact. No rash noted. Psychiatric: Mood and affect are normal. Speech and behavior are normal. Patient exhibits appropriate insight and judgment.  ____________________________________________    LABS (pertinent positives/negatives)  Labs Reviewed  CBC - Abnormal; Notable for the following:    Hemoglobin 11.6 (*)    HCT 34.9 (*)    All other components within normal limits  COMPREHENSIVE METABOLIC PANEL - Abnormal; Notable for the following:    Chloride 97 (*)    Glucose, Bld 204 (*)    Creatinine, Ser 1.18 (*)    Total Protein 8.6 (*)    Albumin 3.4 (*)    Alkaline Phosphatase 227 (*)    GFR calc non Af Amer 44 (*)    GFR calc Af Amer 51 (*)    All other components within normal limits  URINALYSIS COMPLETEWITH MICROSCOPIC ([facility] ONLY) - Abnormal; Notable for the following:    Color, Urine YELLOW (*)    APPearance CLEAR (*)    Squamous Epithelial / LPF 0-5 (*)    All other components within normal limits  CULTURE, BLOOD (ROUTINE X 2)  CULTURE, BLOOD (ROUTINE X 2)  TROPONIN I   CK      RADIOLOGY   DG Chest Portable 1 View (Final result) Result time: 07/09/15 09:02:04   Final result by Rad Results In Interface (07/09/15 09:02:04)   Narrative:   CLINICAL DATA: Productive cough. Fever for 1 week. Nonsmoker.  EXAM: PORTABLE CHEST - 1 VIEW  COMPARISON: 09/23/2014  FINDINGS: Heart size is normal. There is minimal left lower lobe atelectasis. There are no focal consolidations or pleural effusions.  IMPRESSION: Subsegmental left lower lobe atelectasis.   Electronically Signed By: Norva PavlovElizabeth Homer Pfeifer M.D. On: 07/09/2015 09:02           INITIAL IMPRESSION / ASSESSMENT AND PLAN / ED COURSE  Pertinent labs & imaging results that were available during my care of the patient were reviewed by me and considered in my medical decision making (see chart for details).    ____________________________________________   FINAL CLINICAL IMPRESSION(S) / ED DIAGNOSES  Final diagnoses:  Community acquired pneumonia      Darci Currentandolph N Kinleigh Nault, MD 07/11/15 [phone]

## 2015-07-09 NOTE — ED Notes (Signed)
Waiting on d/c papers at this time. Pt and family verbalized understanding. No further orders.

## 2015-07-10 ENCOUNTER — Encounter: Payer: Medicare Other | Admitting: Pain Medicine

## 2015-07-13 ENCOUNTER — Observation Stay
Admission: EM | Admit: 2015-07-13 | Discharge: 2015-07-13 | Disposition: A | Discharge status: Home / Self Care | Payer: Medicare Other | Attending: Specialist | Admitting: Specialist

## 2015-07-13 DIAGNOSIS — M5416 Radiculopathy, lumbar region: Secondary | ICD-10-CM | POA: Insufficient documentation

## 2015-07-13 DIAGNOSIS — M545 Low back pain: Secondary | ICD-10-CM | POA: Insufficient documentation

## 2015-07-13 DIAGNOSIS — M1990 Unspecified osteoarthritis, unspecified site: Secondary | ICD-10-CM | POA: Diagnosis not present

## 2015-07-13 DIAGNOSIS — E785 Hyperlipidemia, unspecified: Secondary | ICD-10-CM | POA: Insufficient documentation

## 2015-07-13 DIAGNOSIS — E119 Type 2 diabetes mellitus without complications: Secondary | ICD-10-CM | POA: Insufficient documentation

## 2015-07-13 DIAGNOSIS — E1140 Type 2 diabetes mellitus with diabetic neuropathy, unspecified: Secondary | ICD-10-CM | POA: Insufficient documentation

## 2015-07-13 DIAGNOSIS — Z8249 Family history of ischemic heart disease and other diseases of the circulatory system: Secondary | ICD-10-CM | POA: Diagnosis not present

## 2015-07-13 DIAGNOSIS — I482 Chronic atrial fibrillation: Secondary | ICD-10-CM | POA: Diagnosis not present

## 2015-07-13 DIAGNOSIS — Z87891 Personal history of nicotine dependence: Secondary | ICD-10-CM | POA: Insufficient documentation

## 2015-07-13 DIAGNOSIS — Z882 Allergy status to sulfonamides status: Secondary | ICD-10-CM | POA: Diagnosis not present

## 2015-07-13 DIAGNOSIS — Y929 Unspecified place or not applicable: Secondary | ICD-10-CM | POA: Insufficient documentation

## 2015-07-13 DIAGNOSIS — M961 Postlaminectomy syndrome, not elsewhere classified: Secondary | ICD-10-CM | POA: Insufficient documentation

## 2015-07-13 DIAGNOSIS — I10 Essential (primary) hypertension: Secondary | ICD-10-CM | POA: Diagnosis not present

## 2015-07-13 DIAGNOSIS — G9519 Other vascular myelopathies: Secondary | ICD-10-CM | POA: Diagnosis not present

## 2015-07-13 DIAGNOSIS — J45909 Unspecified asthma, uncomplicated: Secondary | ICD-10-CM | POA: Insufficient documentation

## 2015-07-13 DIAGNOSIS — T464X5A Adverse effect of angiotensin-converting-enzyme inhibitors, initial encounter: Secondary | ICD-10-CM | POA: Insufficient documentation

## 2015-07-13 DIAGNOSIS — Z90710 Acquired absence of both cervix and uterus: Secondary | ICD-10-CM | POA: Insufficient documentation

## 2015-07-13 DIAGNOSIS — J449 Chronic obstructive pulmonary disease, unspecified: Secondary | ICD-10-CM | POA: Insufficient documentation

## 2015-07-13 DIAGNOSIS — I2510 Atherosclerotic heart disease of native coronary artery without angina pectoris: Secondary | ICD-10-CM | POA: Insufficient documentation

## 2015-07-13 DIAGNOSIS — Z794 Long term (current) use of insulin: Secondary | ICD-10-CM | POA: Diagnosis not present

## 2015-07-13 DIAGNOSIS — I480 Paroxysmal atrial fibrillation: Secondary | ICD-10-CM | POA: Insufficient documentation

## 2015-07-13 DIAGNOSIS — E118 Type 2 diabetes mellitus with unspecified complications: Secondary | ICD-10-CM | POA: Diagnosis present

## 2015-07-13 DIAGNOSIS — G8929 Other chronic pain: Secondary | ICD-10-CM | POA: Insufficient documentation

## 2015-07-13 DIAGNOSIS — M5136 Other intervertebral disc degeneration, lumbar region: Secondary | ICD-10-CM | POA: Diagnosis not present

## 2015-07-13 DIAGNOSIS — R011 Cardiac murmur, unspecified: Secondary | ICD-10-CM | POA: Diagnosis not present

## 2015-07-13 DIAGNOSIS — Z7982 Long term (current) use of aspirin: Secondary | ICD-10-CM | POA: Diagnosis not present

## 2015-07-13 DIAGNOSIS — Z79899 Other long term (current) drug therapy: Secondary | ICD-10-CM | POA: Insufficient documentation

## 2015-07-13 DIAGNOSIS — Z86010 Personal history of colonic polyps: Secondary | ICD-10-CM | POA: Insufficient documentation

## 2015-07-13 DIAGNOSIS — Z91041 Radiographic dye allergy status: Secondary | ICD-10-CM | POA: Diagnosis not present

## 2015-07-13 DIAGNOSIS — Z8673 Personal history of transient ischemic attack (TIA), and cerebral infarction without residual deficits: Secondary | ICD-10-CM | POA: Insufficient documentation

## 2015-07-13 DIAGNOSIS — K219 Gastro-esophageal reflux disease without esophagitis: Secondary | ICD-10-CM | POA: Insufficient documentation

## 2015-07-13 DIAGNOSIS — T783XXA Angioneurotic edema, initial encounter: Secondary | ICD-10-CM | POA: Diagnosis present

## 2015-07-13 DIAGNOSIS — K589 Irritable bowel syndrome without diarrhea: Secondary | ICD-10-CM | POA: Diagnosis not present

## 2015-07-13 DIAGNOSIS — M4806 Spinal stenosis, lumbar region: Secondary | ICD-10-CM | POA: Diagnosis not present

## 2015-07-13 LAB — CBC
HCT: 31.7 % — ABNORMAL LOW (ref 35.0–47.0)
Hemoglobin: 10.5 g/dL — ABNORMAL LOW (ref 12.0–16.0)
MCH: 30.4 pg (ref 26.0–34.0)
MCHC: 33.3 g/dL (ref 32.0–36.0)
MCV: 91.3 fL (ref 80.0–100.0)
PLATELETS: 468 10*3/uL — AB (ref 150–440)
RBC: 3.47 MIL/uL — AB (ref 3.80–5.20)
RDW: 13.3 % (ref 11.5–14.5)
WBC: 10.9 10*3/uL (ref 3.6–11.0)

## 2015-07-13 LAB — BASIC METABOLIC PANEL
ANION GAP: 9 (ref 5–15)
BUN: 11 mg/dL (ref 6–20)
CO2: 27 mmol/L (ref 22–32)
CREATININE: 1.19 mg/dL — AB (ref 0.44–1.00)
Calcium: 9.1 mg/dL (ref 8.9–10.3)
Chloride: 102 mmol/L (ref 101–111)
GFR calc Af Amer: 50 mL/min — ABNORMAL LOW (ref >60)
GFR calc non Af Amer: 44 mL/min — ABNORMAL LOW (ref >60)
Glucose, Bld: 155 mg/dL — ABNORMAL HIGH (ref 65–99)
POTASSIUM: 4.3 mmol/L (ref 3.5–5.1)
SODIUM: 138 mmol/L (ref 135–145)

## 2015-07-13 LAB — GLUCOSE, CAPILLARY
GLUCOSE-CAPILLARY: 168 mg/dL — AB (ref 65–99)
Glucose-Capillary: 298 mg/dL — ABNORMAL HIGH (ref 65–99)

## 2015-07-13 MED ORDER — ONDANSETRON HCL 4 MG PO TABS: 4.0000 mg | ORAL_TABLET | Freq: Four times a day (QID) | ORAL | Status: DC | PRN

## 2015-07-13 MED ORDER — FAMOTIDINE IN NACL 20-0.9 MG/50ML-% IV SOLN
20.0000 mg | Freq: Once | INTRAVENOUS | Status: AC
  Administered 2015-07-13: 20 mg via INTRAVENOUS
  Filled 2015-07-13: qty 50

## 2015-07-13 MED ORDER — ALBUTEROL SULFATE (2.5 MG/3ML) 0.083% IN NEBU
2.5000 mg | INHALATION_SOLUTION | RESPIRATORY_TRACT | Status: DC | PRN
Start: 2015-07-13 — End: 2015-07-13

## 2015-07-13 MED ORDER — PREGABALIN 25 MG PO CAPS
25.0000 mg | ORAL_CAPSULE | Freq: Every day | ORAL | Status: DC
  Administered 2015-07-13: 25 mg via ORAL
  Filled 2015-07-13 (×2): qty 1

## 2015-07-13 MED ORDER — PREDNISONE 10 MG PO TABS: 10.0000 mg | ORAL_TABLET | Freq: Every day | ORAL | Status: DC

## 2015-07-13 MED ORDER — INSULIN ASPART 100 UNIT/ML ~~LOC~~ SOLN
0.0000 [IU] | Freq: Three times a day (TID) | SUBCUTANEOUS | Status: DC
  Administered 2015-07-13: 5 [IU] via SUBCUTANEOUS

## 2015-07-13 MED ORDER — METOPROLOL SUCCINATE ER 50 MG PO TB24: 50.0000 mg | ORAL_TABLET | Freq: Every evening | ORAL | Status: DC

## 2015-07-13 MED ORDER — SIMVASTATIN 20 MG PO TABS: 20.0000 mg | ORAL_TABLET | Freq: Every day | ORAL | Status: DC

## 2015-07-13 MED ORDER — ISOSORBIDE MONONITRATE ER 30 MG PO TB24
30.0000 mg | ORAL_TABLET | Freq: Every day | ORAL | Status: DC
  Administered 2015-07-13: 30 mg via ORAL
  Filled 2015-07-13: qty 1

## 2015-07-13 MED ORDER — ONDANSETRON HCL 4 MG/2ML IJ SOLN: 4.0000 mg | Freq: Four times a day (QID) | INTRAMUSCULAR | Status: DC | PRN

## 2015-07-13 MED ORDER — METHYLPREDNISOLONE SODIUM SUCC 125 MG IJ SOLR
125.0000 mg | Freq: Once | INTRAMUSCULAR | Status: AC
  Administered 2015-07-13: 125 mg via INTRAVENOUS
  Filled 2015-07-13: qty 2

## 2015-07-13 MED ORDER — DILTIAZEM HCL ER COATED BEADS 120 MG PO CP24
120.0000 mg | ORAL_CAPSULE | Freq: Every day | ORAL | Status: DC
  Administered 2015-07-13: 120 mg via ORAL
  Filled 2015-07-13: qty 1

## 2015-07-13 MED ORDER — METHYLPREDNISOLONE SODIUM SUCC 40 MG IJ SOLR
40.0000 mg | Freq: Four times a day (QID) | INTRAMUSCULAR | Status: DC
  Administered 2015-07-13: 40 mg via INTRAVENOUS
  Filled 2015-07-13 (×2): qty 1

## 2015-07-13 MED ORDER — SODIUM CHLORIDE 0.9 % IV SOLN: 250.0000 mL | INTRAVENOUS | Status: DC | PRN

## 2015-07-13 MED ORDER — SODIUM CHLORIDE 0.9 % IJ SOLN
3.0000 mL | Freq: Two times a day (BID) | INTRAMUSCULAR | Status: DC
  Administered 2015-07-13: 3 mL via INTRAVENOUS

## 2015-07-13 MED ORDER — ACETAMINOPHEN 325 MG PO TABS: 650.0000 mg | ORAL_TABLET | Freq: Four times a day (QID) | ORAL | Status: DC | PRN

## 2015-07-13 MED ORDER — ACETAMINOPHEN 650 MG RE SUPP: 650.0000 mg | Freq: Four times a day (QID) | RECTAL | Status: DC | PRN

## 2015-07-13 MED ORDER — ASPIRIN 81 MG PO TABS: 81.0000 mg | ORAL_TABLET | Freq: Every day | ORAL | Status: DC

## 2015-07-13 MED ORDER — ASPIRIN EC 81 MG PO TBEC
81.0000 mg | DELAYED_RELEASE_TABLET | Freq: Every day | ORAL | Status: DC
  Administered 2015-07-13: 81 mg via ORAL
  Filled 2015-07-13: qty 1

## 2015-07-13 MED ORDER — SODIUM CHLORIDE 0.9 % IJ SOLN: 3.0000 mL | INTRAMUSCULAR | Status: DC | PRN

## 2015-07-13 MED ORDER — HEPARIN SODIUM (PORCINE) 5000 UNIT/ML IJ SOLN
5000.0000 [IU] | Freq: Three times a day (TID) | INTRAMUSCULAR | Status: DC
  Administered 2015-07-13: 5000 [IU] via SUBCUTANEOUS
  Filled 2015-07-13: qty 1

## 2015-07-13 MED ORDER — DICYCLOMINE HCL 20 MG PO TABS
20.0000 mg | ORAL_TABLET | Freq: Two times a day (BID) | ORAL | Status: DC
  Administered 2015-07-13: 20 mg via ORAL
  Filled 2015-07-13: qty 1

## 2015-07-13 MED ORDER — SODIUM CHLORIDE 0.9 % IJ SOLN: 3.0000 mL | Freq: Two times a day (BID) | INTRAMUSCULAR | Status: DC

## 2015-07-13 MED ORDER — PANTOPRAZOLE SODIUM 40 MG PO TBEC
40.0000 mg | DELAYED_RELEASE_TABLET | Freq: Every day | ORAL | Status: DC
  Administered 2015-07-13: 40 mg via ORAL
  Filled 2015-07-13: qty 1

## 2015-07-13 MED ORDER — DIPHENHYDRAMINE HCL 50 MG/ML IJ SOLN
50.0000 mg | Freq: Once | INTRAMUSCULAR | Status: AC
  Administered 2015-07-13: 50 mg via INTRAVENOUS
  Filled 2015-07-13: qty 1

## 2015-07-13 NOTE — Discharge Summary (Signed)
[facility] - [place] at [facility]   PATIENT NAME: Yvette Collier    MR#:  [id number]  DATE OF BIRTH:  [birth year]  DATE OF ADMISSION:  07/13/2015 ADMITTING PHYSICIAN: Houston Siren, MD  DATE OF DISCHARGE: 07/13/2015  PRIMARY CARE PHYSICIAN: Hyman Hopes, MD    ADMISSION DIAGNOSIS:  Angioedema, initial encounter [T78.3XXA]  DISCHARGE DIAGNOSIS:  Principal Problem:   Angioedema Active Problems:   Diabetes mellitus type 2 with complications   Essential hypertension   DM2 (diabetes mellitus, type 2)   SECONDARY DIAGNOSIS:   Past Medical History  Diagnosis Date  . GERD (gastroesophageal reflux disease)   . Asthma   . Diabetes mellitus without complication   . Heart murmur   . IBS (irritable bowel syndrome)   . History of colon polyps   . Hypertension   . Osteoarthritis   . Chronic lower back pain   . Ischemic colitis   . Gastrointestinal bleed   . COPD (chronic obstructive pulmonary disease)   . Transient cerebral ischemia due to atrial fibrillation   . CAD (coronary artery disease)   . PAF (paroxysmal atrial fibrillation)     a. 09/2014  . Atrial fibrillation atrial fib    HOSPITAL COURSE:   74 year old female with past medical history of hypertension, COPD, previous history of TIA, coronary disease, chronic afibrillation, GERD, type 2 diabetes without compensation, IBS, osteoarthritis, who presented to the hospital due to tongue swelling and facial swelling secondary to angioedema.  #1 angioedema-this was likely thought to be secondary to Ace inhibitors. Patient had been on lisinopril for the past month. -Patient was admitted to the hospital started on IV steroids and H2 antagonists and has clinically improved. Presently patient's tongue is less swollen she is able to tolerate by mouth well and denies any shortness of breath any evidence of anaphylaxis. -She is therefore being discharged on a prednisone taper and has been taken off ACE  inhibitor's indefinitely.  #2 history of chronic afibrillation-patient remained rate controlled and she will continue her Cardizem, Toprol.  #3 hypertension-patient remained hemodynamically stable. She will continue her Toprol, Cardizem, Imdur.  #4. Irritable Bowel syndrome-continue dicyclomine  #5 hyperlipidemia-continue simvastatin.  #6 neuropathy-continue Lyrica  Patient is clinically doing well and therefore being discharged home on a prednisone taper.  DISCHARGE CONDITIONS:   Stable  CONSULTS OBTAINED:     DRUG ALLERGIES:   Allergies  Allergen Reactions  . Effexor [Venlafaxine] Hives  . Ivp Dye [Iodinated Diagnostic Agents] Hives  . Sulfa Antibiotics Rash    DISCHARGE MEDICATIONS:   Current Discharge Medication List    START taking these medications   Details  predniSONE (DELTASONE) 10 MG tablet Take 1 tablet (10 mg total) by mouth daily with breakfast. Qty: 15 tablet, Refills: 0      CONTINUE these medications which have NOT CHANGED   Details  acetaminophen-codeine (TYLENOL #3) 300-30 MG per tablet Take 2 tablets by mouth every 8 (eight) hours as needed for moderate pain. Qty: 30 tablet, Refills: 0    DEXILANT 60 MG capsule Take 60 mg by mouth daily.     dicyclomine (BENTYL) 20 MG tablet Take 20 mg by mouth 2 (two) times daily.    diltiazem (CARDIZEM CD) 120 MG 24 hr capsule Take 1 capsule (120 mg total) by mouth daily after breakfast. Qty: 90 capsule, Refills: 3    isosorbide mononitrate (IMDUR) 30 MG 24 hr tablet Take 30 mg by mouth daily.     levofloxacin (LEVAQUIN) 500  MG tablet Take 1 tablet (500 mg total) by mouth daily. Qty: 10 tablet, Refills: 0    oxyCODONE (OXY IR/ROXICODONE) 5 MG immediate release tablet Limited 1 tablet by mouth 1-3 times per day if tolerated Qty: 90 tablet, Refills: 0    pregabalin (LYRICA) 25 MG capsule Limit 1 tablet by mouth per day if tolerated Qty: 30 capsule, Refills: 2    simvastatin (ZOCOR) 20 MG tablet Take 1  tablet by mouth daily.    aspirin 81 MG tablet Take 81 mg by mouth daily.    metoprolol succinate (TOPROL-XL) 50 MG 24 hr tablet Take 1 tablet (50 mg total) by mouth every evening. Take with or immediately following a meal. Qty: 90 tablet, Refills: 3      STOP taking these medications     lisinopril (PRINIVIL,ZESTRIL) 10 MG tablet      cefUROXime (CEFTIN) 250 MG tablet      cefUROXime (CEFTIN) 250 MG tablet      metoprolol tartrate (LOPRESSOR) 25 MG tablet          DISCHARGE INSTRUCTIONS:   DIET:  Cardiac diet  DISCHARGE CONDITION:  Stable  ACTIVITY:  Activity as tolerated  OXYGEN:  Home Oxygen: No.   Oxygen Delivery: room air  DISCHARGE LOCATION:  home   If you experience worsening of your admission symptoms, develop shortness of breath, life threatening emergency, suicidal or homicidal thoughts you must seek medical attention immediately by calling 911 or calling your MD immediately  if symptoms less severe.  You Must read complete instructions/literature along with all the possible adverse reactions/side effects for all the Medicines you take and that have been prescribed to you. Take any new Medicines after you have completely understood and accpet all the possible adverse reactions/side effects.   Please note  You were cared for by a hospitalist during your hospital stay. If you have any questions about your discharge medications or the care you received while you were in the hospital after you are discharged, you can call the unit and asked to speak with the hospitalist on call if the hospitalist that took care of you is not available. Once you are discharged, your primary care physician will handle any further medical issues. Please note that NO REFILLS for any discharge medications will be authorized once you are discharged, as it is imperative that you return to your primary care physician (or establish a relationship with a primary care physician if you do not  have one) for your aftercare needs so that they can reassess your need for medications and monitor your lab values.     Today   Patient feels better. Tongue is less swollen. Able to take by mouth. No shortness of breath, or evidence anaphylaxis.  VITAL SIGNS:  Blood pressure 131/74, pulse 78, temperature 98.9 F (37.2 C), temperature source Oral, resp. rate 16, height 5\' 5"  (1.651 m), weight 68.312 kg (150 lb 9.6 oz), SpO2 97 %.  I/O:   Intake/Output Summary (Last 24 hours) at 07/13/15 1220 Last data filed at 07/13/15 0800  Gross per 24 hour  Intake    240 ml  Output      0 ml  Net    240 ml    PHYSICAL EXAMINATION:  GENERAL:  74 y.o.-year-old patient lying in the bed with no acute distress.  EYES: Pupils equal, round, reactive to light and accommodation. No scleral icterus. Extraocular muscles intact.  HEENT: Head atraumatic, normocephalic. Oropharynx and nasopharynx clear.  NECK:  Supple,  no jugular venous distention. No thyroid enlargement, no tenderness.  LUNGS: Good air entry bilaterally, no rales,rhonchi, minimal end expiratory wheezing. No use of accessory muscles of respiration.  CARDIOVASCULAR: S1, S2 RRR. No murmurs, rubs, or gallops.  ABDOMEN: Soft, non-tender, non-distended. Bowel sounds present. No organomegaly or mass.  EXTREMITIES: No pedal edema, cyanosis, or clubbing.  NEUROLOGIC: Cranial nerves II through XII are intact. No focal motor or sensory defecits b/l.  PSYCHIATRIC: The patient is alert and oriented x 3. Good affect.  SKIN: No obvious rash, lesion, or ulcer.   DATA REVIEW:   CBC  Recent Labs Lab 07/13/15 0429  WBC 10.9  HGB 10.5*  HCT 31.7*  PLT 468*    Chemistries   Recent Labs Lab 07/09/15 0904 07/13/15 0429  NA 136 138  K 4.5 4.3  CL 97* 102  CO2 26 27  GLUCOSE 204* 155*  BUN 15 11  CREATININE 1.18* 1.19*  CALCIUM 9.5 9.1  AST 22  --   ALT 24  --   ALKPHOS 227*  --   BILITOT 0.6  --     Cardiac Enzymes  Recent  Labs Lab 07/09/15 0904  TROPONINI <0.03     Management plans discussed with the patient, family and they are in agreement.  CODE STATUS:     Code Status Orders        Start     Ordered   07/13/15 0802  Full code   Continuous     07/13/15 0801    Advance Directive Documentation        Most Recent Value   Type of Advance Directive  Healthcare Power of Attorney, Living will   Pre-existing out of facility DNR order (yellow form or pink MOST form)     "MOST" Form in Place?        TOTAL TIME TAKING CARE OF THIS PATIENT: 40 minutes.    Houston Siren M.D on 07/13/2015 at 12:20 PM  Between 7am to 6pm - Pager - [phone]  After 6pm go to [email or web address] - password [email or web address] [facility]  [place]  Office  [phone]  CC: Primary care physician; Hyman Hopes, MD

## 2015-07-13 NOTE — Progress Notes (Signed)
Pt d/c to home today.  IV removed intact.  Pt d/c instructions reviewed and all questions and concerns addressed.  Rx printed and given to pt w/review of administration reviewed.  Medication Education printed on D/C paperwork.  Family at pts bedside for transfer home.

## 2015-07-13 NOTE — ED Notes (Signed)
Pt presents to ED with c/o tongue swelling and difficulty swallowing d/t an allergic reaction to Lisinopril. Pt was seen here in the ED last week and d/c'd with PNA. Pt is A&O, in NAD, speaking in complete and clear sentences. Manson Passey, MD at bedside upon [facility] arrival.

## 2015-07-13 NOTE — Discharge Instructions (Signed)
°  DIET:  Cardiac diet  DISCHARGE CONDITION:  Stable  ACTIVITY:  Activity as tolerated  OXYGEN:  Home Oxygen: No.   Oxygen Delivery: room air  DISCHARGE LOCATION:  home   If you experience worsening of your admission symptoms, develop shortness of breath, life threatening emergency, suicidal or homicidal thoughts you must seek medical attention immediately by calling 911 or calling your MD immediately  if symptoms less severe.  You Must read complete instructions/literature along with all the possible adverse reactions/side effects for all the Medicines you take and that have been prescribed to you. Take any new Medicines after you have completely understood and accpet all the possible adverse reactions/side effects.   Please note  You were cared for by a hospitalist during your hospital stay. If you have any questions about your discharge medications or the care you received while you were in the hospital after you are discharged, you can call the unit and asked to speak with the hospitalist on call if the hospitalist that took care of you is not available. Once you are discharged, your primary care physician will handle any further medical issues. Please note that NO REFILLS for any discharge medications will be authorized once you are discharged, as it is imperative that you return to your primary care physician (or establish a relationship with a primary care physician if you do not have one) for your aftercare needs so that they can reassess your need for medications and monitor your lab values.    Angioedema Angioedema is sudden puffiness (swelling), often of the skin. It can happen:  On your face or privates (genitals).  In your belly (abdomen) or other body parts. It usually happens quickly and gets better in 1 or 2 days. It often starts at night and is found when you wake up. You may get red, itchy patches of skin (hives). Attacks can be dangerous if your breathing passages  get puffy. The condition may happen only once, or it can come back at random times. It may happen for several years before it goes away for good. HOME CARE  Only take medicines as told by your doctor.  Always carry your emergency allergy medicines with you.  Wear a medical bracelet as told by your doctor.  Avoid things that you know will cause attacks (triggers). GET HELP IF:  You have another attack.  Your attacks happen more often or get worse.  The condition was passed to you by your parents and you want to have children. GET HELP RIGHT AWAY IF:   Your mouth, tongue, or lips are very puffy.  You have trouble breathing.  You have trouble swallowing.  You pass out (faint). MAKE SURE YOU:   Understand these instructions.  Will watch your condition.  Will get help right away if you are not doing well or get worse. Document Released: 11/27/2009 Document Revised: 09/29/2013 Document Reviewed: 08/02/2013 [facility] Patient Information 2015 [place], [place]. This information is not intended to replace advice given to you by your health care provider. Make sure you discuss any questions you have with your health care provider.

## 2015-07-13 NOTE — H&P (Signed)
[facility] - [place] at [facility]   PATIENT NAME: Yvette Collier    MR#:  [id number]  DATE OF BIRTH:  Apr 07, 1941  DATE OF ADMISSION:  07/13/2015  PRIMARY CARE PHYSICIAN: Hyman Hopes, MD   REQUESTING/REFERRING PHYSICIAN: Dr. Manson Passey  CHIEF COMPLAINT:   Chief Complaint  Patient presents with  . Allergic Reaction   swelling of the tongue  HISTORY OF PRESENT ILLNESS:  Yvette Collier  is a 74 y.o. female with a known history of below mentioned multiple medical problems, recently started on lisinopril 1 month ago presents to the emergency room with the complaints of acute onset of swelling of the tongue which started around 2 AM today. No associated shortness of breath, no wheezing, no swallow or speech difficulties. Denies any chest pain, nausea, vomiting, diarrhea. In the emergency room patient was evaluated by the ED physician and noted to have swelling of the tongue without any difficulty in breathing and stable vital signs. Patient received IV Solu-Medrol, Benadryl, PPI and hospitalist service was consulted for further management. Patient is comfortably lying in the bed at this time, states swelling of the tongue is improving gradually.  PAST MEDICAL HISTORY:   Past Medical History  Diagnosis Date  . GERD (gastroesophageal reflux disease)   . Asthma   . Diabetes mellitus without complication   . Heart murmur   . IBS (irritable bowel syndrome)   . History of colon polyps   . Hypertension   . Osteoarthritis   . Chronic lower back pain   . Ischemic colitis   . Gastrointestinal bleed   . COPD (chronic obstructive pulmonary disease)   . Transient cerebral ischemia due to atrial fibrillation   . CAD (coronary artery disease)   . PAF (paroxysmal atrial fibrillation)     a. 09/2014  . Atrial fibrillation atrial fib    PAST SURGICAL HISTORY:   Past Surgical History  Procedure Laterality Date  . Foot surgery Right   . Stomach surgery    . Abdominal  hysterectomy    . Hemorrhoid banding    . Total hip arthroplasty    . Back surgery    . Cholecystectomy    . Appendectomy    . Tonsillectomy      SOCIAL HISTORY:   History  Substance Use Topics  . Smoking status: Former Smoker -- 1.00 packs/day for 1 years  . Smokeless tobacco: Never Used  . Alcohol Use: No    FAMILY HISTORY:   Family History  Problem Relation Age of Onset  . Heart attack Mother   . Hypertension Mother   . Heart disease Father   . Hypertension Father   . Hypertension Brother   . Hypertension Maternal Aunt     DRUG ALLERGIES:   Allergies  Allergen Reactions  . Effexor [Venlafaxine] Hives  . Ivp Dye [Iodinated Diagnostic Agents] Hives  . Sulfa Antibiotics Rash    REVIEW OF SYSTEMS:   Review of Systems  Constitutional: Negative for fever, chills and malaise/fatigue.  HENT: Negative for ear pain, hearing loss, nosebleeds, sore throat and tinnitus.        Swelling of the tongue(  Eyes: Negative for blurred vision, double vision, pain, discharge and redness.  Respiratory: Negative for cough, hemoptysis, sputum production, shortness of breath and wheezing.   Cardiovascular: Negative for chest pain, palpitations, orthopnea and leg swelling.  Gastrointestinal: Negative for nausea, vomiting, abdominal pain, diarrhea, constipation, blood in stool and melena.  Genitourinary: Negative for dysuria, urgency, frequency and  hematuria.  Musculoskeletal: Negative for back pain, joint pain and neck pain.  Skin: Negative for itching and rash.  Neurological: Negative for dizziness, tingling, sensory change, focal weakness and seizures.  Endo/Heme/Allergies: Does not bruise/bleed easily.  Psychiatric/Behavioral: Negative for depression. The patient is not nervous/anxious.     MEDICATIONS AT HOME:   Prior to Admission medications   Medication Sig Start Date End Date Taking? Authorizing Provider  acetaminophen-codeine (TYLENOL #3) 300-30 MG per tablet Take 2  tablets by mouth every 8 (eight) hours as needed for moderate pain. 07/09/15   Darci Current, MD  aspirin 81 MG tablet Take 81 mg by mouth daily.    Historical Provider, MD  cefUROXime (CEFTIN) 250 MG tablet Take 1 tablet (250 mg total) by mouth 2 (two) times daily with a meal. Patient not taking: Reported on 06/12/2015 05/17/15   Ewing Schlein, MD  cefUROXime (CEFTIN) 250 MG tablet Take 1 tablet (250 mg total) by mouth 2 (two) times daily with a meal. Patient not taking: Reported on 06/19/2015 06/19/15   Ewing Schlein, MD  DEXILANT 60 MG capsule Take 60 mg by mouth daily.  09/15/14   Historical Provider, MD  dicyclomine (BENTYL) 20 MG tablet Take 20 mg by mouth 2 (two) times daily.    Historical Provider, MD  diltiazem (CARDIZEM CD) 120 MG 24 hr capsule Take 1 capsule (120 mg total) by mouth daily after breakfast. 05/25/15   Antonieta Iba, MD  isosorbide mononitrate (IMDUR) 30 MG 24 hr tablet Take 30 mg by mouth daily.  09/01/14   Historical Provider, MD  levofloxacin (LEVAQUIN) 500 MG tablet Take 1 tablet (500 mg total) by mouth daily. 07/09/15 07/19/15  Darci Current, MD  lisinopril (PRINIVIL,ZESTRIL) 10 MG tablet Take 10 mg by mouth daily.    Historical Provider, MD  metoprolol succinate (TOPROL-XL) 50 MG 24 hr tablet Take 1 tablet (50 mg total) by mouth every evening. Take with or immediately following a meal. Patient not taking: Reported on 06/19/2015 05/25/15   Antonieta Iba, MD  metoprolol tartrate (LOPRESSOR) 25 MG tablet Take 1 tablet (25 mg total) by mouth 2 (two) times daily as needed. For atrial fibrillation Patient not taking: Reported on 06/12/2015 10/20/14   Antonieta Iba, MD  oxyCODONE (OXY IR/ROXICODONE) 5 MG immediate release tablet Limited 1 tablet by mouth 1-3 times per day if tolerated 06/12/15   Ewing Schlein, MD  pregabalin (LYRICA) 25 MG capsule Limit 1 tablet by mouth per day if tolerated 05/11/15   Ewing Schlein, MD  simvastatin (ZOCOR) 20 MG tablet Take 1 tablet by mouth  daily. 06/21/13   Historical Provider, MD      VITAL SIGNS:  Blood pressure 121/72, pulse 68, temperature 99.4 F (37.4 C), temperature source Oral, resp. rate 16, height 5\' 5"  (1.651 m), weight 68.947 kg (152 lb), SpO2 94 %.  PHYSICAL EXAMINATION:  Physical Exam  Constitutional: She is oriented to person, place, and time. She appears well-developed and well-nourished. No distress.  HENT:  Head: Normocephalic and atraumatic.  Right Ear: External ear normal.  Left Ear: External ear normal.  Nose: Nose normal.  Mouth/Throat: Oropharynx is clear and moist. No oropharyngeal exudate.  Mild swelling of the tongue +  Eyes: EOM are normal. Pupils are equal, round, and reactive to light. No scleral icterus.  Neck: Normal range of motion. Neck supple. No JVD present. No thyromegaly present.  Cardiovascular: Normal rate, regular rhythm, normal heart sounds and intact distal pulses.  Exam reveals  no friction rub.   No murmur heard. Respiratory: Effort normal and breath sounds normal. No respiratory distress. She has no wheezes. She has no rales. She exhibits no tenderness.  GI: Soft. Bowel sounds are normal. She exhibits no distension and no mass. There is no tenderness. There is no rebound and no guarding.  Musculoskeletal: Normal range of motion. She exhibits no edema.  Lymphadenopathy:    She has no cervical adenopathy.  Neurological: She is alert and oriented to person, place, and time. She has normal reflexes. She displays normal reflexes. No cranial nerve deficit. She exhibits normal muscle tone.  Skin: Skin is warm. No rash noted. No erythema.  Psychiatric: She has a normal mood and affect. Her behavior is normal. Thought content normal.   LABORATORY PANEL:   CBC  Recent Labs Lab 07/13/15 0429  WBC 10.9  HGB 10.5*  HCT 31.7*  PLT 468*   ------------------------------------------------------------------------------------------------------------------  Chemistries   Recent  Labs Lab 07/09/15 0904 07/13/15 0429  NA 136 138  K 4.5 4.3  CL 97* 102  CO2 26 27  GLUCOSE 204* 155*  BUN 15 11  CREATININE 1.18* 1.19*  CALCIUM 9.5 9.1  AST 22  --   ALT 24  --   ALKPHOS 227*  --   BILITOT 0.6  --    ------------------------------------------------------------------------------------------------------------------  Cardiac Enzymes  Recent Labs Lab 07/09/15 0904  TROPONINI <0.03   ------------------------------------------------------------------------------------------------------------------  RADIOLOGY:  No results found.  EKG:   Orders placed or performed during the hospital encounter of 07/13/15  . ED EKG  . ED EKG  . EKG 12-Lead  . EKG 12-Lead  Normal sinus rhythm with ventricular rate of 73 bpm, first-degree AV block  IMPRESSION AND PLAN:   1. Swelling of the tongue-angioedema. Patient was recently started on lisinopril 1 month ago. No respiratory compromise. Plan: Admit to medical floor for observation, discontinue lisinopril, continue Solu-Medrol, PPI. Monitor symptoms closely. 2. Hypertension, stable. Continue home medications except for lisinopril. Monitor BP closely. 3. Diabetes mellitus type 2, not on any home medications. Patient stable clinically. Sliding scale insulin, monitor blood sugars closely. 4. Irritable bowel syndrome, stable on home medications. Continue same. 5. GERD, stable on PPI. Continue same.    All the records are reviewed and case discussed with ED provider. Management plans discussed with the patient, family and they are in agreement.  CODE STATUS: Full code  TOTAL TIME TAKING CARE OF THIS PATIENT: 45 minutes.    Jonnie Kind N M.D on 07/13/2015 at 6:55 AM  Between 7am to 6pm - Pager - [phone]  After 6pm go to [email or web address] - password [email or web address] [facility]  [place] [facility]  Office  [phone]  CC: Primary care physician; Hyman Hopes, MD

## 2015-07-13 NOTE — Progress Notes (Signed)
Received pt from ED to Rm 217.  Pt AOx4.  VSS.  No signs of acute distress.  IV access intact and saline locked.  Medications administered (See MAR).  Admission completed.  Assessment completed.  DVT prophylaxis assessed and completed.   Education provided on call bell, bed alarm, telephone and IV/IV Pole/IV Alarms.  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  [place] Board was completed and updated PRN.    Dietary specification confirmed.    VSS.  Family at bedside.  Pt resting comfortably and quietly w/bed in low and locked position and call bell and telephone within reach.  Will continue to monitor.

## 2015-07-13 NOTE — ED Provider Notes (Signed)
[facility] Emergency Department Provider Note  ____________________________________________  Time seen: 4:15 AM  I have reviewed the triage vital signs and the nursing notes.   HISTORY  Chief Complaint Allergic Reaction      HPI SHAWNTIA MANGAL is a 74 y.o. female presents with acute onset of tongue swelling 1 hour prior to procedure arrival     Past Medical History  Diagnosis Date  . GERD (gastroesophageal reflux disease)   . Asthma   . Diabetes mellitus without complication   . Heart murmur   . IBS (irritable bowel syndrome)   . History of colon polyps   . Hypertension   . Osteoarthritis   . Chronic lower back pain   . Ischemic colitis   . Gastrointestinal bleed   . COPD (chronic obstructive pulmonary disease)   . Transient cerebral ischemia due to atrial fibrillation   . CAD (coronary artery disease)   . PAF (paroxysmal atrial fibrillation)     a. 09/2014  . Atrial fibrillation atrial fib    Patient Active Problem List   Diagnosis Date Noted  . Status post lumbar laminectomy 06/19/2015  . Lumbar radiculopathy 06/19/2015  . DDD (degenerative disc disease), lumbar 05/11/2015  . Facet syndrome, lumbar 05/11/2015  . Lumbar post-laminectomy syndrome 05/11/2015  . Sacroiliac joint disease 05/11/2015  . 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 Neuropathy due to secondary diabetes 05/11/2015  . Essential hypertension 01/03/2015  . CAD (coronary artery disease)   . PAF (paroxysmal atrial fibrillation) 10/20/2014  . Ischemic colitis 10/20/2014  . 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 COPD (chronic obstructive pulmonary disease) 10/20/2014  . Hyperlipidemia 10/20/2014  . GERD (gastroesophageal reflux disease) 10/20/2014  . Internal hemorrhoid, bleeding 07/14/2013    Past Surgical History  Procedure Laterality Date  . Foot surgery Right   . Stomach surgery    . Abdominal hysterectomy     . Hemorrhoid banding    . Total hip arthroplasty    . Back surgery    . Cholecystectomy    . Appendectomy    . Tonsillectomy      Current Outpatient Rx  Name  Route  Sig  Dispense  Refill  . acetaminophen-codeine (TYLENOL #3) 300-30 MG per tablet   Oral   Take 2 tablets by mouth every 8 (eight) hours as needed for moderate pain.   30 tablet   0   . aspirin 81 MG tablet   Oral   Take 81 mg by mouth daily.         . cefUROXime (CEFTIN) 250 MG tablet   Oral   Take 1 tablet (250 mg total) by mouth 2 (two) times daily with a meal. Patient not taking: Reported on 06/12/2015   14 tablet   0   . cefUROXime (CEFTIN) 250 MG tablet   Oral   Take 1 tablet (250 mg total) by mouth 2 (two) times daily with a meal. Patient not taking: Reported on 06/19/2015   14 tablet   0   . DEXILANT 60 MG capsule   Oral   Take 60 mg by mouth daily.          Marland Kitchen dicyclomine (BENTYL) 20 MG tablet   Oral   Take 20 mg by mouth 2 (two) times daily.         Marland Kitchen diltiazem (CARDIZEM CD) 120 MG 24 hr capsule   Oral   Take 1 capsule (120 mg total) by mouth  daily after breakfast.   90 capsule   3   . isosorbide mononitrate (IMDUR) 30 MG 24 hr tablet   Oral   Take 30 mg by mouth daily.          Marland Kitchen levofloxacin (LEVAQUIN) 500 MG tablet   Oral   Take 1 tablet (500 mg total) by mouth daily.   10 tablet   0   . lisinopril (PRINIVIL,ZESTRIL) 10 MG tablet   Oral   Take 10 mg by mouth daily.         . metoprolol succinate (TOPROL-XL) 50 MG 24 hr tablet   Oral   Take 1 tablet (50 mg total) by mouth every evening. Take with or immediately following a meal. Patient not taking: Reported on 06/19/2015   90 tablet   3   . metoprolol tartrate (LOPRESSOR) 25 MG tablet   Oral   Take 1 tablet (25 mg total) by mouth 2 (two) times daily as needed. For atrial fibrillation Patient not taking: Reported on 06/12/2015   60 tablet   3   . oxyCODONE (OXY IR/ROXICODONE) 5 MG immediate release tablet       Limited 1 tablet by mouth 1-3 times per day if tolerated   90 tablet   0   . pregabalin (LYRICA) 25 MG capsule      Limit 1 tablet by mouth per day if tolerated   30 capsule   2   . simvastatin (ZOCOR) 20 MG tablet   Oral   Take 1 tablet by mouth daily.           Allergies Effexor; Ivp dye; and Sulfa antibiotics  Family History  Problem Relation Age of Onset  . Heart attack Mother   . Hypertension Mother   . Heart disease Father   . Hypertension Father   . Hypertension Brother   . Hypertension Maternal Aunt     Social History History  Substance Use Topics  . Smoking status: Former Smoker -- 1.00 packs/day for 1 years  . Smokeless tobacco: Never Used  . Alcohol Use: No    Review of Systems  Constitutional: Negative for fever. Eyes: Negative for visual changes. ENT: Negative for sore throat. Cardiovascular: Negative for chest pain. Respiratory: Negative for shortness of breath. Gastrointestinal: Negative for abdominal pain, vomiting and diarrhea. Genitourinary: Negative for dysuria. Musculoskeletal: Negative for back pain. Skin: Negative for rash. Neurological: Negative for headaches, focal weakness or numbness.   10-point ROS otherwise negative.  ____________________________________________   PHYSICAL EXAM:  VITAL SIGNS: ED Triage Vitals  Enc Vitals Group     BP 07/13/15 0418 135/72 mmHg     Pulse Rate 07/13/15 0418 84     Resp 07/13/15 0418 20     Temp 07/13/15 0418 99.4 F (37.4 C)     Temp Source 07/13/15 0418 Oral     SpO2 07/13/15 0418 93 %     Weight 07/13/15 0418 152 lb (68.947 kg)     Height 07/13/15 0418  (1.651 m)     Head Cir --      Peak Flow --      Pain Score 07/13/15 0423 0     Pain Loc --      Pain Edu? --      Excl. in GC? --      Constitutional: Alert and oriented. Well appearing and in no distress. Eyes: Conjunctivae are normal. PERRL. Normal extraocular movements. ENT   Head: Normocephalic and  atraumatic.  Nose: No congestion/rhinnorhea.   Mouth/Throat: Mucous membranes are moist. Edematous tongue however able to visualize posterior oropharynx   Neck: No stridor. Cardiovascular: Normal rate, regular rhythm. Normal and symmetric distal pulses are present in all extremities. No murmurs, rubs, or gallops. Respiratory: Normal respiratory effort without tachypnea nor retractions. Breath sounds are clear and equal bilaterally. No wheezes/rales/rhonchi. Gastrointestinal: Soft and nontender. No distention. There is no CVA tenderness. Genitourinary: deferred Musculoskeletal: Nontender with normal range of motion in all extremities. No joint effusions.  No lower extremity tenderness nor edema. Neurologic:  Normal speech and language. No gross focal neurologic deficits are appreciated. Speech is normal.  Skin:  Skin is warm, dry and intact. No rash noted. Psychiatric: Mood and affect are normal. Speech and behavior are normal. Patient exhibits appropriate insight and judgment.  ____________________________________________    LABS (pertinent positives/negatives)  Labs Reviewed  CBC - Abnormal; Notable for the following:    RBC 3.47 (*)    Hemoglobin 10.5 (*)    HCT 31.7 (*)    Platelets 468 (*)    All other components within normal limits  BASIC METABOLIC PANEL - Abnormal; Notable for the following:    Glucose, Bld 155 (*)    Creatinine, Ser 1.19 (*)    GFR calc non Af Amer 44 (*)    GFR calc Af Amer 50 (*)    All other components within normal limits         INITIAL IMPRESSION / ASSESSMENT AND PLAN / ED COURSE  Pertinent labs & imaging results that were available during my care of the patient were reviewed by me and considered in my medical decision making (see chart for details).  History of physical exam consistent with angioedema. Likely secondary to lisinopril (ACE inhibitor). Patient received Solu-Medrol 125 mg Benadryl 50 mg and Pepcid with minimal  improvement. As such patient will be admitted to the hospital for continued observation.  ____________________________________________   FINAL CLINICAL IMPRESSION(S) / ED DIAGNOSES  Final diagnoses:  Angioedema, initial encounter      Darci Current, MD 07/13/15 [phone]

## 2015-07-13 NOTE — Care Management (Signed)
Spoke with the patient and family. Patient is form home alone but has family support in the area.  Daughter stated that she manages well at home.  Patient is able to take care of herself at home and drives self. Uses a walker at home or a cane. No 02. Meds are filled in bubble pack and asked daughter to take to her pharmacist to have Lisinopril removed from bubble packs. Patient is here for Angioedema.  Will need follow up with Dr Lawerance Bach her PCP.  No CM needs identified. Family proactive in patient care. Patient does have and aid that visits the home daily. Spoke with patient nurse Kenney Houseman who will discuss discharge instructions with patient and family.

## 2015-07-14 LAB — CULTURE, BLOOD (ROUTINE X 2)
CULTURE: NO GROWTH
Culture: NO GROWTH

## 2015-07-17 ENCOUNTER — Ambulatory Visit: Payer: Medicare Other | Attending: Pain Medicine | Admitting: Pain Medicine

## 2015-07-17 VITALS — BP 141/66 | HR 56 | Temp 98.5°F | Resp 16 | Ht 65.0 in | Wt 152.0 lb

## 2015-07-17 DIAGNOSIS — M5416 Radiculopathy, lumbar region: Secondary | ICD-10-CM | POA: Diagnosis not present

## 2015-07-17 DIAGNOSIS — Z9889 Other specified postprocedural states: Secondary | ICD-10-CM

## 2015-07-17 DIAGNOSIS — M5136 Other intervertebral disc degeneration, lumbar region: Secondary | ICD-10-CM | POA: Insufficient documentation

## 2015-07-17 DIAGNOSIS — M79605 Pain in left leg: Secondary | ICD-10-CM | POA: Diagnosis present

## 2015-07-17 DIAGNOSIS — M79604 Pain in right leg: Secondary | ICD-10-CM | POA: Diagnosis present

## 2015-07-17 DIAGNOSIS — M48062 Spinal stenosis, lumbar region with neurogenic claudication: Secondary | ICD-10-CM

## 2015-07-17 DIAGNOSIS — M4806 Spinal stenosis, lumbar region: Secondary | ICD-10-CM | POA: Insufficient documentation

## 2015-07-17 DIAGNOSIS — E1340 Other specified diabetes mellitus with diabetic neuropathy, unspecified: Secondary | ICD-10-CM

## 2015-07-17 DIAGNOSIS — M47816 Spondylosis without myelopathy or radiculopathy, lumbar region: Secondary | ICD-10-CM

## 2015-07-17 DIAGNOSIS — M961 Postlaminectomy syndrome, not elsewhere classified: Secondary | ICD-10-CM

## 2015-07-17 DIAGNOSIS — M533 Sacrococcygeal disorders, not elsewhere classified: Secondary | ICD-10-CM | POA: Insufficient documentation

## 2015-07-17 DIAGNOSIS — M545 Low back pain: Secondary | ICD-10-CM | POA: Diagnosis present

## 2015-07-17 MED ORDER — OXYCODONE HCL 5 MG PO TABS
ORAL_TABLET | ORAL | Status: DC
Start: 2015-07-17 — End: 2015-08-17

## 2015-07-17 NOTE — Patient Instructions (Signed)
Continue present medications Lyrica and oxycodone  F/U PCP Dr. Lawerance Bach for evaliation of  BP and general medical  condition.  F/U surgical evaluation  F/U neurological evaluation Dr.Cram    May consider radiofrequency rhizolysis or intraspinal procedures pending response to present treatment and F/U evaluation.  [facility] should patient have concerns prior to scheduled return appointment.

## 2015-07-17 NOTE — Progress Notes (Signed)
   Subjective:    Patient ID: Yvette Collier, female    DOB: 11-05-1941, 74 y.o.   MRN: [id number]  HPI Patient is 74 year old female [facility] and treatment of pain involving the region of the lower back and lower extremity region predominantly. Patient was recently hospitalized for pneumonia and will follow-up with Dr. Lawerance Bach in this regard. We discussed patient's condition and will continue presently prescribed medications. We will avoid interventional treatment at this time as discussed and explained to patient. The patient stated that she had excellent relief of pain following lumbosacral selective nerve root block and would like to have the procedure performed begin as soon as possible. Patient will continue Lyrica and oxycodone as prescribed at this time and will call [facility] prior to scheduled return appointment should there be significant change in condition prior to scheduled return appointment. The patient was understanding and in agreement with suggested treatment plan     Review of Systems     Objective:   Physical Exam There was tends to palpation of the splenius capitis and occipitalis musculature region of mild degree. There was mild tenderness of the cervical facet cervical paraspinal musculature region and the thoracic facet thoracic paraspinal musculature region. Palpation of the acromioclavicular glenohumeral joint region was with mild discomfort. There was unremarkable Spurling's maneuver. Tinel and Phalen's maneuver were without increase of pain of significant degree. Patient appeared to be with bilaterally equal grip strength. Palpation over the thoracic facet thoracic paraspinal muscles region was with mild discomfort with mild muscle spasms noted in the mid and lower thoracic paraspinal musculature region. No crepitus of the thoracic region was noted. Palpation over the lumbar paraspinal muscular region lumbar facet  region associated with moderate to moderately severe discomfort. Lateral bending and rotation and extension and palpation of the lumbar facets reproduce moderately severe discomfort. Straight leg raising was tolerates approximately 20 without an increase of pain with dorsiflexion noted. There was negative clonus negative Homans. Questionably  decreased sensation L5 dermatomal distribution. There was mild tenderness of the PSIS and PII S region on the left and moderate tenderness on the right. Abdomen was nontender with no costovertebral angle tenderness noted       Assessment & Plan:    Degenerative disc disease lumbar spine Status post surgery lumbar region with multilevel degenerative changes lumbar spine with L4-L5 level most involved. Facet arthropathy, foraminal stenosis  Lumbar radiculopathy  Lumbar facet syndrome  Sacroiliac joint dysfunction    Plan  Continue present medications Lyrica and oxycodone Dr. Lawerance Bach  F/U PCP for evaliation of  BP, diabetes mellitus, pneumonia, and general medical  condition.  F/U surgical evaluation Dr.Cram   F/U neurological evaluation  May consider radiofrequency rhizolysis or intraspinal procedures pending response to present treatment and F/U evaluation.  [facility] to [facility] should patient have concerns prior to scheduled return appointment.

## 2015-07-19 ENCOUNTER — Emergency Department
Admission: EM | Admit: 2015-07-19 | Discharge: 2015-07-19 | Disposition: A | Discharge status: Home / Self Care | Payer: Medicare Other | Attending: Emergency Medicine | Admitting: Emergency Medicine

## 2015-07-19 DIAGNOSIS — R112 Nausea with vomiting, unspecified: Secondary | ICD-10-CM | POA: Insufficient documentation

## 2015-07-19 DIAGNOSIS — R197 Diarrhea, unspecified: Secondary | ICD-10-CM | POA: Insufficient documentation

## 2015-07-19 DIAGNOSIS — Z87891 Personal history of nicotine dependence: Secondary | ICD-10-CM | POA: Insufficient documentation

## 2015-07-19 DIAGNOSIS — I10 Essential (primary) hypertension: Secondary | ICD-10-CM | POA: Insufficient documentation

## 2015-07-19 DIAGNOSIS — Z79899 Other long term (current) drug therapy: Secondary | ICD-10-CM | POA: Insufficient documentation

## 2015-07-19 DIAGNOSIS — E119 Type 2 diabetes mellitus without complications: Secondary | ICD-10-CM | POA: Insufficient documentation

## 2015-07-19 DIAGNOSIS — Z792 Long term (current) use of antibiotics: Secondary | ICD-10-CM | POA: Insufficient documentation

## 2015-07-19 DIAGNOSIS — R109 Unspecified abdominal pain: Secondary | ICD-10-CM | POA: Insufficient documentation

## 2015-07-19 DIAGNOSIS — Z7982 Long term (current) use of aspirin: Secondary | ICD-10-CM | POA: Diagnosis not present

## 2015-07-19 LAB — COMPREHENSIVE METABOLIC PANEL
ALBUMIN: 3.4 g/dL — AB (ref 3.5–5.0)
ALT: 23 U/L (ref 14–54)
ANION GAP: 10 (ref 5–15)
AST: 23 U/L (ref 15–41)
Alkaline Phosphatase: 119 U/L (ref 38–126)
BILIRUBIN TOTAL: 0.4 mg/dL (ref 0.3–1.2)
BUN: 20 mg/dL (ref 6–20)
CALCIUM: 9.4 mg/dL (ref 8.9–10.3)
CHLORIDE: 98 mmol/L — AB (ref 101–111)
CO2: 28 mmol/L (ref 22–32)
Creatinine, Ser: 1.06 mg/dL — ABNORMAL HIGH (ref 0.44–1.00)
GFR calc Af Amer: 58 mL/min — ABNORMAL LOW (ref >60)
GFR calc non Af Amer: 50 mL/min — ABNORMAL LOW (ref >60)
Glucose, Bld: 333 mg/dL — ABNORMAL HIGH (ref 65–99)
POTASSIUM: 4.7 mmol/L (ref 3.5–5.1)
Sodium: 136 mmol/L (ref 135–145)
TOTAL PROTEIN: 7.2 g/dL (ref 6.5–8.1)

## 2015-07-19 LAB — URINALYSIS COMPLETE WITH MICROSCOPIC (ARMC ONLY)
BILIRUBIN URINE: NEGATIVE
GLUCOSE, UA: 50 mg/dL — AB
Hgb urine dipstick: NEGATIVE
KETONES UR: NEGATIVE mg/dL
LEUKOCYTES UA: NEGATIVE
NITRITE: NEGATIVE
Protein, ur: NEGATIVE mg/dL
Specific Gravity, Urine: 1.008 (ref 1.005–1.030)
pH: 6 (ref 5.0–8.0)

## 2015-07-19 LAB — CBC WITH DIFFERENTIAL/PLATELET
Basophils Absolute: 0 10*3/uL (ref 0–0.1)
Basophils Relative: 0 %
Eosinophils Absolute: 0 10*3/uL (ref 0–0.7)
Eosinophils Relative: 0 %
HCT: 33.9 % — ABNORMAL LOW (ref 35.0–47.0)
Hemoglobin: 11 g/dL — ABNORMAL LOW (ref 12.0–16.0)
LYMPHS PCT: 7 %
Lymphs Abs: 0.9 10*3/uL — ABNORMAL LOW (ref 1.0–3.6)
MCH: 29.5 pg (ref 26.0–34.0)
MCHC: 32.5 g/dL (ref 32.0–36.0)
MCV: 90.6 fL (ref 80.0–100.0)
Monocytes Absolute: 0.2 10*3/uL (ref 0.2–0.9)
Monocytes Relative: 2 %
NEUTROS PCT: 91 %
Neutro Abs: 11.4 10*3/uL — ABNORMAL HIGH (ref 1.4–6.5)
Platelets: 582 10*3/uL — ABNORMAL HIGH (ref 150–440)
RBC: 3.74 MIL/uL — AB (ref 3.80–5.20)
RDW: 13.7 % (ref 11.5–14.5)
WBC: 12.6 10*3/uL — AB (ref 3.6–11.0)

## 2015-07-19 LAB — LIPASE, BLOOD: Lipase: 18 U/L — ABNORMAL LOW (ref 22–51)

## 2015-07-19 MED ORDER — RANITIDINE HCL 150 MG PO TABS
150.0000 mg | ORAL_TABLET | Freq: Two times a day (BID) | ORAL | Status: DC
Start: 2015-07-19 — End: 2015-10-19

## 2015-07-19 MED ORDER — GI COCKTAIL ~~LOC~~
30.0000 mL | Freq: Once | ORAL | Status: AC
  Administered 2015-07-19: 30 mL via ORAL
  Filled 2015-07-19: qty 30

## 2015-07-19 MED ORDER — SUCRALFATE 1 G PO TABS: 1.0000 g | ORAL_TABLET | Freq: Four times a day (QID) | ORAL | Status: DC

## 2015-07-19 NOTE — Discharge Instructions (Signed)
Please seek medical attention for any high fevers, chest pain, shortness of breath, change in behavior, persistent vomiting, bloody stool or any other new or concerning symptoms. ° °Abdominal Pain, Women °Abdominal (stomach, pelvic, or belly) pain can be caused by many things. It is important to tell your doctor: °· The location of the pain. °· Does it come and go or is it present all the time? °· Are there things that start the pain (eating certain foods, exercise)? °· Are there other symptoms associated with the pain (fever, nausea, vomiting, diarrhea)? °All of this is helpful to know when trying to find the cause of the pain. °CAUSES  °· Stomach: virus or bacteria infection, or ulcer. °· Intestine: appendicitis (inflamed appendix), regional ileitis (Crohn's disease), ulcerative colitis (inflamed colon), irritable bowel syndrome, diverticulitis (inflamed diverticulum of the colon), or cancer of the stomach or intestine. °· Gallbladder disease or stones in the gallbladder. °· Kidney disease, kidney stones, or infection. °· Pancreas infection or cancer. °· Fibromyalgia (pain disorder). °· Diseases of the female organs: °¨ Uterus: fibroid (non-cancerous) tumors or infection. °¨ Fallopian tubes: infection or tubal pregnancy. °¨ Ovary: cysts or tumors. °¨ Pelvic adhesions (scar tissue). °¨ Endometriosis (uterus lining tissue growing in the pelvis and on the pelvic organs). °¨ Pelvic congestion syndrome (female organs filling up with blood just before the menstrual period). °¨ Pain with the menstrual period. °¨ Pain with ovulation (producing an egg). °¨ Pain with an IUD (intrauterine device, birth control) in the uterus. °¨ Cancer of the female organs. °· Functional pain (pain not caused by a disease, may improve without treatment). °· Psychological pain. °· Depression. °DIAGNOSIS  °Your doctor will decide the seriousness of your pain by doing an examination. °· Blood tests. °· X-rays. °· Ultrasound. °· CT scan  (computed tomography, special type of X-ray). °· MRI (magnetic resonance imaging). °· Cultures, for infection. °· Barium enema (dye inserted in the large intestine, to better view it with X-rays). °· Colonoscopy (looking in intestine with a lighted tube). °· Laparoscopy (minor surgery, looking in abdomen with a lighted tube). °· Major abdominal exploratory surgery (looking in abdomen with a large incision). °TREATMENT  °The treatment will depend on the cause of the pain.  °· Many cases can be observed and treated at home. °· Over-the-counter medicines recommended by your caregiver. °· Prescription medicine. °· Antibiotics, for infection. °· Birth control pills, for painful periods or for ovulation pain. °· Hormone treatment, for endometriosis. °· Nerve blocking injections. °· Physical therapy. °· Antidepressants. °· Counseling with a psychologist or psychiatrist. °· Minor or major surgery. °HOME CARE INSTRUCTIONS  °· Do not take laxatives, unless directed by your caregiver. °· Take over-the-counter pain medicine only if ordered by your caregiver. Do not take aspirin because it can cause an upset stomach or bleeding. °· Try a clear liquid diet (broth or water) as ordered by your caregiver. Slowly move to a bland diet, as tolerated, if the pain is related to the stomach or intestine. °· Have a thermometer and take your temperature several times a day, and record it. °· Bed rest and sleep, if it helps the pain. °· Avoid sexual intercourse, if it causes pain. °· Avoid stressful situations. °· Keep your follow-up appointments and tests, as your caregiver orders. °· If the pain does not go away with medicine or surgery, you may try: °¨ Acupuncture. °¨ Relaxation exercises (yoga, meditation). °¨ Group therapy. °¨ Counseling. °SEEK MEDICAL CARE IF:  °· You notice certain foods cause stomach   pain. °· Your home care treatment is not helping your pain. °· You need stronger pain medicine. °· You want your IUD removed. °· You  feel faint or lightheaded. °· You develop nausea and vomiting. °· You develop a rash. °· You are having side effects or an allergy to your medicine. °SEEK IMMEDIATE MEDICAL CARE IF:  °· Your pain does not go away or gets worse. °· You have a fever. °· Your pain is felt only in portions of the abdomen. The right side could possibly be appendicitis. The left lower portion of the abdomen could be colitis or diverticulitis. °· You are passing blood in your stools (bright red or black tarry stools, with or without vomiting). °· You have blood in your urine. °· You develop chills, with or without a fever. °· You pass out. °MAKE SURE YOU:  °· Understand these instructions. °· Will watch your condition. °· Will get help right away if you are not doing well or get worse. °Document Released: 10/06/2007 Document Revised: 04/25/2014 Document Reviewed: 10/26/2009 °[employer]® Patient Information ©2015 [employer]. This information is not intended to replace advice given to you by your health care provider. Make sure you discuss any questions you have with your health care provider. ° °

## 2015-07-19 NOTE — ED Provider Notes (Signed)
[facility] Emergency Department Provider Note    ____________________________________________  Time seen: 1420  I have reviewed the triage vital signs and the nursing notes.   HISTORY  Chief Complaint No chief complaint on file.   History limited by: Not Limited   HPI Yvette Collier is a 74 y.o. female who presents to the emergency department because of abdominal pain. The patient states that she has been having some upper abdominal pain for the past couple days however today it was much more severe. She describes it as being located in the epigastric region she denies any radiation. She states it is sharp. She has had some associated bloody stool today. She denies any vomiting although she has had some nausea. She states she has felt some chills but no true fevers. States she had similar pain roughly 1 year ago and was diagnosed with colitis. Has had recent hospitalization for angioedema thought secondary to ACE inhibitor.     Past Medical History  Diagnosis Date  . GERD (gastroesophageal reflux disease)   . Asthma   . Diabetes mellitus without complication   . Heart murmur   . IBS (irritable bowel syndrome)   . History of colon polyps   . Hypertension   . Osteoarthritis   . Chronic lower back pain   . Ischemic colitis   . Gastrointestinal bleed   . COPD (chronic obstructive pulmonary disease)   . Transient cerebral ischemia due to atrial fibrillation   . CAD (coronary artery disease)   . PAF (paroxysmal atrial fibrillation)     a. 09/2014  . Atrial fibrillation atrial fib    Patient Active Problem List   Diagnosis Date Noted  . Angioedema 07/13/2015  . DM2 (diabetes mellitus, type 2) 07/13/2015  . Status post lumbar laminectomy 06/19/2015  . Lumbar radiculopathy 06/19/2015  . DDD (degenerative disc disease), lumbar 05/11/2015  . Facet syndrome, lumbar 05/11/2015  . Lumbar post-laminectomy syndrome 05/11/2015  . Sacroiliac joint disease  05/11/2015  . 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 Neuropathy due to secondary diabetes 05/11/2015  . Essential hypertension 01/03/2015  . CAD (coronary artery disease)   . PAF (paroxysmal atrial fibrillation) 10/20/2014  . Ischemic colitis 10/20/2014  . 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 COPD (chronic obstructive pulmonary disease) 10/20/2014  . Hyperlipidemia 10/20/2014  . GERD (gastroesophageal reflux disease) 10/20/2014  . Internal hemorrhoid, bleeding 07/14/2013    Past Surgical History  Procedure Laterality Date  . Foot surgery Right   . Stomach surgery    . Abdominal hysterectomy    . Hemorrhoid banding    . Total hip arthroplasty    . Back surgery    . Cholecystectomy    . Appendectomy    . Tonsillectomy      Current Outpatient Rx  Name  Route  Sig  Dispense  Refill  . acetaminophen-codeine (TYLENOL #3) 300-30 MG per tablet   Oral   Take 2 tablets by mouth every 8 (eight) hours as needed for moderate pain.   30 tablet   0   . aspirin 81 MG tablet   Oral   Take 81 mg by mouth daily.         Marland Kitchen DEXILANT 60 MG capsule   Oral   Take 60 mg by mouth daily.          Marland Kitchen dicyclomine (BENTYL) 20 MG tablet   Oral   Take 20 mg by mouth 2 (two)  times daily.         Marland Kitchen diltiazem (CARDIZEM CD) 120 MG 24 hr capsule   Oral   Take 1 capsule (120 mg total) by mouth daily after breakfast.   90 capsule   3   . isosorbide mononitrate (IMDUR) 30 MG 24 hr tablet   Oral   Take 30 mg by mouth daily.          Marland Kitchen levofloxacin (LEVAQUIN) 500 MG tablet   Oral   Take 1 tablet (500 mg total) by mouth daily.   10 tablet   0   . metoprolol succinate (TOPROL-XL) 50 MG 24 hr tablet   Oral   Take 1 tablet (50 mg total) by mouth every evening. Take with or immediately following a meal.   90 tablet   3   . oxyCODONE (OXY IR/ROXICODONE) 5 MG immediate release tablet      Limited 1 tablet by mouth 1-3 times per day  if tolerated   90 tablet   0   . predniSONE (DELTASONE) 10 MG tablet   Oral   Take 1 tablet (10 mg total) by mouth daily with breakfast.   15 tablet   0     Label  & dispense according to the schedule below. ...   . pregabalin (LYRICA) 25 MG capsule      Limit 1 tablet by mouth per day if tolerated Patient not taking: Reported on 07/17/2015   30 capsule   2   . simvastatin (ZOCOR) 20 MG tablet   Oral   Take 1 tablet by mouth daily.           Allergies Effexor; Ivp dye; Lisinopril; and Sulfa antibiotics  Family History  Problem Relation Age of Onset  . Heart attack Mother   . Hypertension Mother   . Heart disease Father   . Hypertension Father   . Hypertension Brother   . Hypertension Maternal Aunt     Social History History  Substance Use Topics  . Smoking status: Former Smoker -- 1.00 packs/day for 1 years  . Smokeless tobacco: Never Used  . Alcohol Use: No    Review of Systems  Constitutional: Positive for chills. Cardiovascular: Negative for chest pain. Respiratory: Negative for shortness of breath. Gastrointestinal: Positive for abdominal pain, vomiting and diarrhea. Genitourinary: Negative for dysuria. Musculoskeletal: Negative for back pain. Skin: Negative for rash. Neurological: Negative for headaches, focal weakness or numbness.   10-point ROS otherwise negative.  ____________________________________________   PHYSICAL EXAM:  VITAL SIGNS:   99.9 F (37.7 C)  77   21  134/80 mmHg  98 %   Constitutional: Alert and oriented. Well appearing and in no distress. Eyes: Conjunctivae are normal. PERRL. Normal extraocular movements. ENT   Head: Normocephalic and atraumatic.   Nose: No congestion/rhinnorhea.   Mouth/Throat: Mucous membranes are moist.   Neck: No stridor. Hematological/Lymphatic/Immunilogical: No cervical lymphadenopathy. Cardiovascular: Normal rate, regular rhythm.  No murmurs, rubs, or gallops. Respiratory:  Normal respiratory effort without tachypnea nor retractions. Breath sounds are clear and equal bilaterally. No wheezes/rales/rhonchi. Gastrointestinal: Soft and minimally tender to palpation in the epigastric and luq. Genitourinary: Deferred Musculoskeletal: Normal range of motion in all extremities. No joint effusions.  No lower extremity tenderness nor edema. Neurologic:  Normal speech and language. No gross focal neurologic deficits are appreciated. Speech is normal.  Skin:  Skin is warm, dry and intact. No rash noted. Psychiatric: Mood and affect are normal. Speech and behavior are normal. Patient exhibits  appropriate insight and judgment.  ____________________________________________    LABS (pertinent positives/negatives)  Labs Reviewed  CBC WITH DIFFERENTIAL/PLATELET - Abnormal; Notable for the following:    WBC 12.6 (*)    RBC 3.74 (*)    Hemoglobin 11.0 (*)    HCT 33.9 (*)    Platelets 582 (*)    Neutro Abs 11.4 (*)    Lymphs Abs 0.9 (*)    All other components within normal limits  COMPREHENSIVE METABOLIC PANEL - Abnormal; Notable for the following:    Chloride 98 (*)    Glucose, Bld 333 (*)    Creatinine, Ser 1.06 (*)    Albumin 3.4 (*)    GFR calc non Af Amer 50 (*)    GFR calc Af Amer 58 (*)    All other components within normal limits  LIPASE, BLOOD - Abnormal; Notable for the following:    Lipase 18 (*)    All other components within normal limits  URINALYSIS COMPLETEWITH MICROSCOPIC ([facility] ONLY) - Abnormal; Notable for the following:    Color, Urine STRAW (*)    APPearance CLEAR (*)    Glucose, UA 50 (*)    Bacteria, UA RARE (*)    Squamous Epithelial / LPF 0-5 (*)    All other components within normal limits     ____________________________________________   EKG  I, Phineas Semen, attending physician, personally viewed and interpreted this EKG  EKG Time: 1427 Rate: 74 Rhythm: sinus rhythm with first degree av block Axis: left axis  deviation Intervals: qtc 404, 1st degree AV block QRS: narrow ST changes: no st elevation ____________________________________________    RADIOLOGY  None  ____________________________________________   PROCEDURES  Procedure(s) performed: None  Critical Care performed: No  ____________________________________________   INITIAL IMPRESSION / ASSESSMENT AND PLAN / ED COURSE  Pertinent labs & imaging results that were available during my care of the patient were reviewed by me and considered in my medical decision making (see chart for details).  She presented to the emergency department today because of abdominal pain that started earlier today. On exam patient has very minimal tenderness to palpation in the epigastric and left upper quadrant. Blood work notable for mild leukocytosis however patient is been on steroids recently. Patient was given a GI cocktail which patient stated did relieve the pain. On repeat abdominal exam patient's abdomen remained soft with extremely minimal tenderness. This point I did have a discussion with patient about possibility of getting CT scan to further evaluate. After discussion with patient and family was decided we would try medications for gastritis and discussed return precautions with patient did not improve.  ____________________________________________   FINAL CLINICAL IMPRESSION(S) / ED DIAGNOSES  Final diagnoses:  Abdominal pain, unspecified abdominal location     Phineas Semen, MD 07/19/15 [phone]

## 2015-07-19 NOTE — ED Notes (Signed)
Pt reports to ED via EMS w/ c/o ab pain.  Per EMS pt has hx of collitis that causes bleeding and irreg HR. Pt reorts bleeding and that pain is similar to ones she has had before.

## 2015-07-26 ENCOUNTER — Other Ambulatory Visit: Payer: Self-pay | Admitting: Pain Medicine

## 2015-07-26 DIAGNOSIS — M5416 Radiculopathy, lumbar region: Secondary | ICD-10-CM

## 2015-07-26 DIAGNOSIS — M533 Sacrococcygeal disorders, not elsewhere classified: Secondary | ICD-10-CM

## 2015-07-26 DIAGNOSIS — M48062 Spinal stenosis, lumbar region with neurogenic claudication: Secondary | ICD-10-CM

## 2015-07-26 DIAGNOSIS — M47816 Spondylosis without myelopathy or radiculopathy, lumbar region: Secondary | ICD-10-CM

## 2015-07-26 DIAGNOSIS — M961 Postlaminectomy syndrome, not elsewhere classified: Secondary | ICD-10-CM

## 2015-07-26 DIAGNOSIS — E1340 Other specified diabetes mellitus with diabetic neuropathy, unspecified: Secondary | ICD-10-CM

## 2015-07-26 DIAGNOSIS — M5136 Other intervertebral disc degeneration, lumbar region: Secondary | ICD-10-CM

## 2015-07-26 DIAGNOSIS — Z9889 Other specified postprocedural states: Secondary | ICD-10-CM

## 2015-07-26 DIAGNOSIS — M47817 Spondylosis without myelopathy or radiculopathy, lumbosacral region: Secondary | ICD-10-CM | POA: Insufficient documentation

## 2015-08-17 ENCOUNTER — Encounter: Payer: Self-pay | Admitting: Pain Medicine

## 2015-08-17 ENCOUNTER — Ambulatory Visit: Payer: Medicare Other | Attending: Pain Medicine | Admitting: Pain Medicine

## 2015-08-17 VITALS — BP 119/87 | HR 55 | Temp 99.4°F | Resp 18 | Ht 65.0 in | Wt 150.0 lb

## 2015-08-17 DIAGNOSIS — M47816 Spondylosis without myelopathy or radiculopathy, lumbar region: Secondary | ICD-10-CM

## 2015-08-17 DIAGNOSIS — M533 Sacrococcygeal disorders, not elsewhere classified: Secondary | ICD-10-CM

## 2015-08-17 DIAGNOSIS — Z9889 Other specified postprocedural states: Secondary | ICD-10-CM | POA: Insufficient documentation

## 2015-08-17 DIAGNOSIS — M48062 Spinal stenosis, lumbar region with neurogenic claudication: Secondary | ICD-10-CM

## 2015-08-17 DIAGNOSIS — M47896 Other spondylosis, lumbar region: Secondary | ICD-10-CM | POA: Diagnosis not present

## 2015-08-17 DIAGNOSIS — M51369 Other intervertebral disc degeneration, lumbar region without mention of lumbar back pain or lower extremity pain: Secondary | ICD-10-CM

## 2015-08-17 DIAGNOSIS — M5416 Radiculopathy, lumbar region: Secondary | ICD-10-CM

## 2015-08-17 DIAGNOSIS — M79605 Pain in left leg: Secondary | ICD-10-CM | POA: Diagnosis present

## 2015-08-17 DIAGNOSIS — M961 Postlaminectomy syndrome, not elsewhere classified: Secondary | ICD-10-CM

## 2015-08-17 DIAGNOSIS — M5136 Other intervertebral disc degeneration, lumbar region: Secondary | ICD-10-CM | POA: Diagnosis not present

## 2015-08-17 DIAGNOSIS — M4806 Spinal stenosis, lumbar region: Secondary | ICD-10-CM | POA: Insufficient documentation

## 2015-08-17 DIAGNOSIS — E1340 Other specified diabetes mellitus with diabetic neuropathy, unspecified: Secondary | ICD-10-CM

## 2015-08-17 DIAGNOSIS — M545 Low back pain: Secondary | ICD-10-CM | POA: Diagnosis present

## 2015-08-17 DIAGNOSIS — M79604 Pain in right leg: Secondary | ICD-10-CM | POA: Diagnosis present

## 2015-08-17 MED ORDER — OXYCODONE HCL 5 MG PO TABS: ORAL_TABLET | ORAL | Status: DC

## 2015-08-17 MED ORDER — PREGABALIN 25 MG PO CAPS: ORAL_CAPSULE | ORAL | Status: DC

## 2015-08-17 NOTE — Progress Notes (Signed)
Safety precautions to be maintained throughout the outpatient stay will include: orient to surroundings, keep bed in low position, maintain call bell within reach at all times, provide assistance with transfer out of bed and ambulation.  

## 2015-08-17 NOTE — Progress Notes (Signed)
   Subjective:    Patient ID: Yvette Collier, female    DOB: 09/14/1941, 74 y.o.   MRN: [id number]  HPI  Patient is 74 year old female [facility] and treatment of pain involving the region of the lower back and lower extremity region. Patient states she does have return of pain of the lower back and lower extremity region and that she is in hopes of being able to undergo interventional treatment in attempt to decrease severity of patient's symptoms. Patient denies any trauma change in events of daily living the call significant change in symptomatology. Patient states that pain of the lower back region radiation of the lower extremities is aggravated with standing and walking. Patient states the pain becomes more intense as the day progresses we will continue present medications and proceed with lumbosacral selective nerve root block at time return appointment in attempt to decrease severity of patient's symptoms, minimize progression of symptoms, and avoid the need for more involved treatment. The patient was understanding and agreement status treatment plan.    Review of Systems     Objective:   Physical Exam There was mild tenderness of the splenius capitis and occipitalis musculature regions. Palpation over the region of the acromioclavicular and glenohumeral joint regions reproduced mild discomfort. Patient appeared to be slightly decreased grip strength. Tinel and Phalen's maneuver without increased pain of significant degree. Palpation cervical facet cervical paraspinal muscles region reproduced mild discomfort there was mild tenderness of the thoracic facet thoracic paraspinal musculature region. Palpation over the lower thoracic region was with muscle spasms of moderate degree. No crepitus of the thoracic region was noted. Palpation over the lumbar paraspinal muscles region lumbar facet region with moderate moderately severe discomfort. Lateral  bending rotation and extension and palpation of the lumbar facets reproduce moderate moderately severe discomfort. Straight leg raising limited to 20 without increased pain with dorsiflexion noted. There was negative clonus negative Homans. The knees Willardson is to palpation in crepitus of the knees with no increased warmth or erythema and nballottement of the patella. There was tenderness of the greater trochanteric region iliotibial band region of mild degree. EHL strength appeared to be decreased with questionable decreased sensation along the L5 dermatomal distribution and with negative clonus negative Homans. Abdomen was nontender and no costovertebral angle tenderness was noted.         Assessment & Plan:    Degenerative disc disease lumbar spine Status post surgery lumbar region with multilevel degenerative changes lumbar spine with L4-L5 level most involved. Facet arthropathy, foraminal stenosis  Lumbar radiculopathy  Lumbar facet syndrome  Sacroiliac joint dysfunction    Plan   Continue present medications Lyrica and oxycodone  Lumbosacral selective nerve root block to be performed at time return appointment  F/U PCP Dr. Lawerance Bach for evaliation of  BP and general medical  condition  F/U surgical evaluation. Patient will follow-up with Dr. Wynetta Emery for surgical reevaluation  F/U neurological evaluation  May consider radiofrequency rhizolysis or intraspinal procedures pending response to present treatment and F/U evaluation   Patient to call [facility] should patient have concerns prior to scheduled return appointmen.

## 2015-08-17 NOTE — Progress Notes (Signed)
Dicharged to home ambulatory with script in hand for Lyrica and oxycodone.  Pre procedure instructions given with teach back 3 done.

## 2015-08-17 NOTE — Patient Instructions (Addendum)
Continue present medications Lyrica and oxycodone  F/U PCP Dr. Lawerance Bach for evaliation of  BP and general medical  Condition . Asked Dr. Lawerance Bach if you can consider taking Cymbalta for you're diabetic neuropathy  F/U surgical evaluation  F/U neurological evaluation  May consider radiofrequency rhizolysis or intraspinal procedures pending response to present treatment and F/U evaluation   Patient to call [facility] should patient have concerns prior to scheduled return appointmen. Lyrica and oxycodoneGENERAL RISKS AND COMPLICATIONS  What are the risk, side effects and possible complications? Generally speaking, most procedures are safe.  However, with any procedure there are risks, side effects, and the possibility of complications.  The risks and complications are dependent upon the sites that are lesioned, or the type of nerve block to be performed.  The closer the procedure is to the spine, the more serious the risks are.  Great care is taken when placing the radio frequency needles, block needles or lesioning probes, but sometimes complications can occur. 1. Infection: Any time there is an injection through the skin, there is a risk of infection.  This is why sterile conditions are used for these blocks.  There are four possible types of infection. 1. Localized skin infection. 2. Central Nervous System Infection-This can be in the form of Meningitis, which can be deadly. 3. Epidural Infections-This can be in the form of an epidural abscess, which can cause pressure inside of the spine, causing compression of the spinal cord with subsequent paralysis. This would require an emergency surgery to decompress, and there are no guarantees that the patient would recover from the paralysis. 4. Discitis-This is an infection of the intervertebral discs.  It occurs in about 1% of discography procedures.  It is difficult to treat and it may lead to surgery.        2. Pain: the needles have to go  through skin and soft tissues, will cause soreness.       3. Damage to internal structures:  The nerves to be lesioned may be near blood vessels or    other nerves which can be potentially damaged.       4. Bleeding: Bleeding is more common if the patient is taking blood thinners such as  aspirin, Coumadin, Ticiid, Plavix, etc., or if he/she have some genetic predisposition  such as hemophilia. Bleeding into the spinal canal can cause compression of the spinal  cord with subsequent paralysis.  This would require an emergency surgery to  decompress and there are no guarantees that the patient would recover from the  paralysis.       5. Pneumothorax:  Puncturing of a lung is a possibility, every time a needle is introduced in  the area of the chest or upper back.  Pneumothorax refers to free air around the  collapsed lung(s), inside of the thoracic cavity (chest cavity).  Another two possible  complications related to a similar event would include: Hemothorax and Chylothorax.   These are variations of the Pneumothorax, where instead of air around the collapsed  lung(s), you may have blood or chyle, respectively.       6. Spinal headaches: They may occur with any procedures in the area of the spine.       7. Persistent CSF (Cerebro-Spinal Fluid) leakage: This is a rare problem, but may occur  with prolonged intrathecal or epidural catheters either due to the formation of a fistulous  track or a dural tear.       8.  Nerve damage: By working so close to the spinal cord, there is always a possibility of  nerve damage, which could be as serious as a permanent spinal cord injury with  paralysis.       9. Death:  Although rare, severe deadly allergic reactions known as "Anaphylactic  reaction" can occur to any of the medications used.      10. Worsening of the symptoms:  We can always make thing worse.  What are the chances of something like this happening? Chances of any of this occuring are extremely low.  By  statistics, you have more of a chance of getting killed in a motor vehicle accident: while driving to the hospital than any of the above occurring .  Nevertheless, you should be aware that they are possibilities.  In general, it is similar to taking a shower.  Everybody knows that you can slip, hit your head and get killed.  Does that mean that you should not shower again?  Nevertheless always keep in mind that statistics do not mean anything if you happen to be on the wrong side of them.  Even if a procedure has a 1 (one) in a 1,000,000 (million) chance of going wrong, it you happen to be that one..Also, keep in mind that by statistics, you have more of a chance of having something go wrong when taking medications.  Who should not have this procedure? If you are on a blood thinning medication (e.g. Coumadin, Plavix, see list of "Blood Thinners"), or if you have an active infection going on, you should not have the procedure.  If you are taking any blood thinners, please inform your physician.  How should I prepare for this procedure?  Do not eat or drink anything at least six hours prior to the procedure.  Bring a driver with you .  It cannot be a taxi.  Come accompanied by an adult that can drive you back, and that is strong enough to help you if your legs get weak or numb from the local anesthetic.  Take all of your medicines the morning of the procedure with just enough water to swallow them.  If you have diabetes, make sure that you are scheduled to have your procedure done first thing in the morning, whenever possible.  If you have diabetes, take only half of your insulin dose and notify our nurse that you have done so as soon as you arrive at the clinic.  If you are diabetic, but only take blood sugar pills (oral hypoglycemic), then do not take them on the morning of your procedure.  You may take them after you have had the procedure.  Do not take aspirin or any aspirin-containing  medications, at least eleven (11) days prior to the procedure.  They may prolong bleeding.  Wear loose fitting clothing that may be easy to take off and that you would not mind if it got stained with Betadine or blood.  Do not wear any jewelry or perfume  Remove any nail coloring.  It will interfere with some of our monitoring equipment.  NOTE: Remember that this is not meant to be interpreted as a complete list of all possible complications.  Unforeseen problems may occur.  BLOOD THINNERS The following drugs contain aspirin or other products, which can cause increased bleeding during surgery and should not be taken for 2 weeks prior to and 1 week after surgery.  If you should need take something for relief of minor pain,  you may take acetaminophen which is found in Tylenol,m Datril, Anacin-3 and Panadol. It is not blood thinner. The products listed below are.  Do not take any of the products listed below in addition to any listed on your instruction sheet.  A.P.C or A.P.C with Codeine Codeine Phosphate Capsules #3 Ibuprofen Ridaura  ABC compound Congesprin Imuran rimadil  Advil Cope Indocin Robaxisal  Alka-Seltzer Effervescent Pain Reliever and Antacid Coricidin or Coricidin-D  Indomethacin Rufen  Alka-Seltzer plus Cold Medicine Cosprin Ketoprofen S-A-C Tablets  Anacin Analgesic Tablets or Capsules Coumadin Korlgesic Salflex  Anacin Extra Strength Analgesic tablets or capsules CP-2 Tablets Lanoril Salicylate  Anaprox Cuprimine Capsules Levenox Salocol  Anexsia-D Dalteparin Magan Salsalate  Anodynos Darvon compound Magnesium Salicylate Sine-off  Ansaid Dasin Capsules Magsal Sodium Salicylate  Anturane Depen Capsules Marnal Soma  APF Arthritis pain formula Dewitt's Pills Measurin Stanback  Argesic Dia-Gesic Meclofenamic Sulfinpyrazone  Arthritis Bayer Timed Release Aspirin Diclofenac Meclomen Sulindac  Arthritis pain formula Anacin Dicumarol Medipren Supac  Analgesic (Safety coated)  Arthralgen Diffunasal Mefanamic Suprofen  Arthritis Strength Bufferin Dihydrocodeine Mepro Compound Suprol  Arthropan liquid Dopirydamole Methcarbomol with Aspirin Synalgos  ASA tablets/Enseals Disalcid Micrainin Tagament  Ascriptin Doan's Midol Talwin  Ascriptin A/D Dolene Mobidin Tanderil  Ascriptin Extra Strength Dolobid Moblgesic Ticlid  Ascriptin with Codeine Doloprin or Doloprin with Codeine Momentum Tolectin  Asperbuf Duoprin Mono-gesic Trendar  Aspergum Duradyne Motrin or Motrin IB Triminicin  Aspirin plain, buffered or enteric coated Durasal Myochrisine Trigesic  Aspirin Suppositories Easprin Nalfon Trillsate  Aspirin with Codeine Ecotrin Regular or Extra Strength Naprosyn Uracel  Atromid-S Efficin Naproxen Ursinus  Auranofin Capsules Elmiron Neocylate Vanquish  Axotal Emagrin Norgesic Verin  Azathioprine Empirin or Empirin with Codeine Normiflo Vitamin E  Azolid Emprazil Nuprin Voltaren  Bayer Aspirin plain, buffered or children's or timed BC Tablets or powders Encaprin Orgaran Warfarin Sodium  Buff-a-Comp Enoxaparin Orudis Zorpin  Buff-a-Comp with Codeine Equegesic Os-Cal-Gesic   Buffaprin Excedrin plain, buffered or Extra Strength Oxalid   Bufferin Arthritis Strength Feldene Oxphenbutazone   Bufferin plain or Extra Strength Feldene Capsules Oxycodone with Aspirin   Bufferin with Codeine Fenoprofen Fenoprofen Pabalate or Pabalate-SF   Buffets II Flogesic Panagesic   Buffinol plain or Extra Strength Florinal or Florinal with Codeine Panwarfarin   Buf-Tabs Flurbiprofen Penicillamine   Butalbital Compound Four-way cold tablets Penicillin   Butazolidin Fragmin Pepto-Bismol   Carbenicillin Geminisyn Percodan   Carna Arthritis Reliever Geopen Persantine   Carprofen Gold's salt Persistin   Chloramphenicol Goody's Phenylbutazone   Chloromycetin Haltrain Piroxlcam   Clmetidine heparin Plaquenil   Cllnoril Hyco-pap Ponstel   Clofibrate Hydroxy chloroquine Propoxyphen          Before stopping any of these medications, be sure to consult the physician who ordered them.  Some, such as Coumadin (Warfarin) are ordered to prevent or treat serious conditions such as "deep thrombosis", "pumonary embolisms", and other heart problems.  The amount of time that you may need off of the medication may also vary with the medication and the reason for which you were taking it.  If you are taking any of these medications, please make sure you notify your pain physician before you undergo any procedures.         Selective Nerve Root Block Patient Information  Description: Specific nerve roots exit the spinal canal and these nerves can be compressed and inflamed by a bulging disc and bone spurs.  By injecting steroids on the nerve root, we can potentially decrease the inflammation surrounding  these nerves, which often leads to decreased pain.  Also, by injecting local anesthesia on the nerve root, this can provide [place] helpful information to give to your referring doctor if it decreases your pain.  Selective nerve root blocks can be done along the spine from the neck to the low back depending on the location of your pain.   After numbing the skin with local anesthesia, a small needle is passed to the nerve root and the position of the needle is verified using x-ray pictures.  After the needle is in correct position, we then deposit the medication.  You may experience a pressure sensation while this is being done.  The entire block usually lasts less than 15 minutes.  Conditions that may be treated with selective nerve root blocks:  Low back and leg pain  Spinal stenosis  Diagnostic block prior to potential surgery  Neck and arm pain  Post laminectomy syndrome  Preparation for the injection:  1. Do not eat any solid food or dairy products within 6 hours of your appointment. 2. You may drink clear liquids up to 2 hours before an appointment.  Clear liquids include water, black  coffee, juice or soda.  No milk or cream please. 3. You may take your regular medications, including pain medications, with a sip of water before your appointment.  Diabetics should hold regular insulin (if taken separately) and take 1/2 normal NPH dose the morning of the procedure.  Carry some sugar containing items with you to your appointment. 4. A driver must accompany you and be prepared to drive you home after your procedure. 5. Bring all your current medications with you. 6. An IV may be inserted and sedation may be given at the discretion of the physician. 7. A blood pressure cuff, EKG, and other monitors will often be applied during the procedure.  Some patients may need to have extra oxygen administered for a short period. 8. You will be asked to provide medical information, including allergies, prior to the procedure.  We must know immediately if you are taking blood  Thinners (like Coumadin) or if you are allergic to IV iodine contrast (dye).  Possible side-effects: All are usually temporary  Bleeding from needle site  Light headedness  Numbness and tingling  Decreased blood pressure  Weakness in arms/legs  Pressure sensation in back/neck  Pain at injection site (several days)  Possible complications: All are extremely rare  Infection  Nerve injury  Spinal headache (a headache wore with upright position)  Call if you experience:  Fever/chills associated with headache or increased back/neck pain  Headache worsened by an upright position  New onset weakness or numbness of an extremity below the injection site  Hives or difficulty breathing (go to the emergency room)  Inflammation or drainage at the injection site(s)  Severe back/neck pain greater than usual  New symptoms which are concerning to you  Please note:  Although the local anesthetic injected can often make your back or neck feel good for several hours after the injection the pain will likely  return.  It takes 3-5 days for steroids to work on the nerve root. You may not notice any pain relief for at least one week.  If effective, we will often do a series of 3 injections spaced 3-6 weeks apart to maximally decrease your pain.    If you have any questions, please call [phone] [facility]

## 2015-08-30 ENCOUNTER — Ambulatory Visit: Payer: Medicare Other | Attending: Pain Medicine | Admitting: Pain Medicine

## 2015-08-30 ENCOUNTER — Encounter: Payer: Self-pay | Admitting: Pain Medicine

## 2015-08-30 VITALS — BP 116/54 | HR 48 | Temp 98.8°F | Resp 14 | Ht 65.0 in | Wt 153.0 lb

## 2015-08-30 DIAGNOSIS — M4806 Spinal stenosis, lumbar region: Secondary | ICD-10-CM | POA: Insufficient documentation

## 2015-08-30 DIAGNOSIS — M79604 Pain in right leg: Secondary | ICD-10-CM | POA: Diagnosis present

## 2015-08-30 DIAGNOSIS — M5126 Other intervertebral disc displacement, lumbar region: Secondary | ICD-10-CM | POA: Insufficient documentation

## 2015-08-30 DIAGNOSIS — M5416 Radiculopathy, lumbar region: Secondary | ICD-10-CM

## 2015-08-30 DIAGNOSIS — M545 Low back pain: Secondary | ICD-10-CM | POA: Diagnosis present

## 2015-08-30 DIAGNOSIS — Z9889 Other specified postprocedural states: Secondary | ICD-10-CM | POA: Diagnosis not present

## 2015-08-30 DIAGNOSIS — M51369 Other intervertebral disc degeneration, lumbar region without mention of lumbar back pain or lower extremity pain: Secondary | ICD-10-CM

## 2015-08-30 DIAGNOSIS — M533 Sacrococcygeal disorders, not elsewhere classified: Secondary | ICD-10-CM

## 2015-08-30 DIAGNOSIS — M48062 Spinal stenosis, lumbar region with neurogenic claudication: Secondary | ICD-10-CM

## 2015-08-30 DIAGNOSIS — M47816 Spondylosis without myelopathy or radiculopathy, lumbar region: Secondary | ICD-10-CM | POA: Diagnosis not present

## 2015-08-30 DIAGNOSIS — M79605 Pain in left leg: Secondary | ICD-10-CM | POA: Diagnosis present

## 2015-08-30 DIAGNOSIS — M5136 Other intervertebral disc degeneration, lumbar region: Secondary | ICD-10-CM

## 2015-08-30 DIAGNOSIS — M961 Postlaminectomy syndrome, not elsewhere classified: Secondary | ICD-10-CM

## 2015-08-30 DIAGNOSIS — E1340 Other specified diabetes mellitus with diabetic neuropathy, unspecified: Secondary | ICD-10-CM

## 2015-08-30 MED ORDER — FENTANYL CITRATE (PF) 100 MCG/2ML IJ SOLN
INTRAMUSCULAR | Status: AC
  Administered 2015-08-30: 50 ug via INTRAVENOUS
  Filled 2015-08-30: qty 2

## 2015-08-30 MED ORDER — ORPHENADRINE CITRATE 30 MG/ML IJ SOLN
INTRAMUSCULAR | Status: AC
  Administered 2015-08-30: 09:00:00
  Filled 2015-08-30: qty 2

## 2015-08-30 MED ORDER — BUPIVACAINE HCL (PF) 0.25 % IJ SOLN
INTRAMUSCULAR | Status: AC
  Administered 2015-08-30: 09:00:00
  Filled 2015-08-30: qty 30

## 2015-08-30 MED ORDER — TRIAMCINOLONE ACETONIDE 40 MG/ML IJ SUSP
INTRAMUSCULAR | Status: AC
  Administered 2015-08-30: 09:00:00
  Filled 2015-08-30: qty 1

## 2015-08-30 MED ORDER — CEFUROXIME AXETIL 250 MG PO TABS: 250.0000 mg | ORAL_TABLET | Freq: Two times a day (BID) | ORAL | Status: DC

## 2015-08-30 MED ORDER — OXYCODONE HCL 5 MG PO TABS: ORAL_TABLET | ORAL | Status: DC

## 2015-08-30 MED ORDER — MIDAZOLAM HCL 5 MG/5ML IJ SOLN
INTRAMUSCULAR | Status: AC
  Administered 2015-08-30: 3 mg via INTRAVENOUS
  Filled 2015-08-30: qty 5

## 2015-08-30 MED ORDER — CEFAZOLIN SODIUM 1 G IJ SOLR
INTRAMUSCULAR | Status: AC
  Administered 2015-08-30: 1 g
  Filled 2015-08-30: qty 10

## 2015-08-30 NOTE — Progress Notes (Signed)
Subjective:    Patient ID: Yvette Collier, female    DOB: 17-Dec-1941, 74 y.o.   MRN: [id number]  HPI  PROCEDURE PERFORMED: Lumbosacral selective nerve root block   NOTE: The patient is a 74 y.o. female who returns to [facility] for further evaluation and treatment of pain involving the lumbar and lower extremity region. Studies consisting of MRI has revealed the patient to be with evidence of degenerative disc disease lumbar spine Status post surgery lumbar region with multilevel degenerative changes lumbar spine with L4-L5 level most involved. Facet arthropathy, foraminal stenosis. There is concern regarding intraspinal abnormalities contributing to the patient's symptomatology with concern regarding component of radicular pain. The risks, benefits, and expectations of the procedure have been explained to the patient who was understanding and in agreement with suggested treatment plan. We will proceed with interventional treatment as discussed and as explained to the patient. The patient is understanding and in agreement with suggested treatment plan.   DESCRIPTION OF PROCEDURE: Lumbosacral selective nerve root block with IV Versed, IV fentanyl conscious sedation, EKG, blood pressure, pulse, and pulse oximetry monitoring. The procedure was performed with the patient in the prone position under fluoroscopic guidance. With the patient in the prone position, Betadine prep of proposed entry site was performed. Local anesthetic skin wheal of proposed needle entry site was prepared with 1.5% plain lidocaine with AP view of the lumbosacral spine.   PROCEDURE #1: Needle placement at the left L 2 vertebral body: A 22 -gauge needle was inserted at the inferior border of the transverse process of the vertebral body with needle placed medial to the midline of the transverse process on AP view of the lumbosacral spine.   NEEDLE PLACEMENT AT  L3, L4, and L5  VERTEBRAL BODY LEVELS  Needle  placement  was accomplished at L3, L4, and L5  vertebral body levels on the left side exactly as was accomplished at the L2  vertebral body level  and utilizing the same technique and under fluoroscopic guidance.   Needle placement was then verified on lateral view at all levels with needle tip documented to be in the posterior superior quadrant of the intervertebral foramen of  L 2, L3, L4, and L5. Following negative aspiration for heme and CSF at each level, each level was injected with 3 mL of 0.25% bupivacaine with Kenalog.   Myoneural block injections of the gluteal musculature region Following Betadine prep proposed entry site 22-gauge needle was inserted in the gluteal musculature region and following negative aspiration 2 cc of 0.25% bupivacaine with Norflex was injected for myoneural block injection 4   The patient tolerated the procedure well. A total of 10 mg of Kenalog was utilized for the procedure.   PLAN:  1. Medications: Will continue presently prescribed medications Lyrica and oxycodone 2. The patient is to undergo follow-up evaluation with PCP Dr. Lawerance Bach for evaluation of blood pressure and general medical condition status post procedure performed on today's visit. 3. Surgical follow-up evaluation.. Patient will follow-up with Dr.Cram as needed  4. Neurological evaluation. 5. May consider radiofrequency procedures, implantation type procedures and other treatment pending response to treatment and follow-up evaluation. 6. The patient has been advise do adhere to proper body mechanics and avoid activities which may aggravate condition. 7. The patient has been advised to call the Pain Management Center prior to scheduled return appointment should there be significant change in the patient's condition or should the patient have other concerns regarding condition prior to scheduled  return appointment.    Review of Systems     Objective:   Physical Exam        Assessment & Plan:

## 2015-08-30 NOTE — Progress Notes (Signed)
Safety precautions to be maintained throughout the outpatient stay will include: orient to surroundings, keep bed in low position, maintain call bell within reach at all times, provide assistance with transfer out of bed and ambulation.  

## 2015-08-30 NOTE — Patient Instructions (Addendum)
PLAN  Continue present medication Lyrica and oxycodone and begin taking antibiotic Ceftin as prescribed. Please obtain your antibiotic today and begin taking antibiotic today  F/U PCP Dr. Lawerance Bach for  evaliation of  BP and general medical  condition.  F/U surgical evaluation. Follow-up with Dr.Cram as needed   F/U neurological evaluation. May consider pending follow-up evaluations  May consider radiofrequency rhizolysis or intraspinal procedures pending response to present treatment and F/U evaluation.  Patient to call Pain Management Center should patient have concerns prior to scheduled return appointment.  Pain Management Discharge Instructions  General Discharge Instructions :  If you need to reach your doctor call: Monday-Friday 8:00 am - 4:00 pm at [phone] or toll free [phone].  After clinic hours [phone] to have operator reach doctor.  Bring all of your medication bottles to all your appointments in the pain clinic.  To cancel or reschedule your appointment with Pain Management please remember to call 24 hours in advance to avoid a fee.  Refer to the educational materials which you have been given on: General Risks, I had my Procedure. Discharge Instructions, Post Sedation.  Post Procedure Instructions:  The drugs you were given will stay in your system until tomorrow, so for the next 24 hours you should not drive, make any legal decisions or drink any alcoholic beverages.  You may eat anything you prefer, but it is better to start with liquids then soups and crackers, and gradually work up to solid foods.  Please notify your doctor immediately if you have any unusual bleeding, trouble breathing or pain that is not related to your normal pain.  Depending on the type of procedure that was done, some parts of your body may feel week and/or numb.  This usually clears up by tonight or the next day.  Walk with the use of an assistive device or accompanied by an  adult for the 24 hours.  You may use ice on the affected area for the first 24 hours.  Put ice in a Ziploc bag and cover with a towel and place against area 15 minutes on 15 minutes off.  You may switch to heat after 24 hours.Selective Nerve Root Block Patient Information  Description: Specific nerve roots exit the spinal canal and these nerves can be compressed and inflamed by a bulging disc and bone spurs.  By injecting steroids on the nerve root, we can potentially decrease the inflammation surrounding these nerves, which often leads to decreased pain.  Also, by injecting local anesthesia on the nerve root, this can provide [place] helpful information to give to your referring doctor if it decreases your pain.  Selective nerve root blocks can be done along the spine from the neck to the low back depending on the location of your pain.   After numbing the skin with local anesthesia, a small needle is passed to the nerve root and the position of the needle is verified using x-ray pictures.  After the needle is in correct position, we then deposit the medication.  You may experience a pressure sensation while this is being done.  The entire block usually lasts less than 15 minutes.  Conditions that may be treated with selective nerve root blocks:  Low back and leg pain  Spinal stenosis  Diagnostic block prior to potential surgery  Neck and arm pain  Post laminectomy syndrome  Preparation for the injection:  1. Do not eat any solid food or dairy products within 6 hours of your appointment. 2.  You may drink clear liquids up to 2 hours before an appointment.  Clear liquids include water, black coffee, juice or soda.  No milk or cream please. 3. You may take your regular medications, including pain medications, with a sip of water before your appointment.  Diabetics should hold regular insulin (if taken separately) and take 1/2 normal NPH dose the morning of the procedure.  Carry some sugar containing  items with you to your appointment. 4. A driver must accompany you and be prepared to drive you home after your procedure. 5. Bring all your current medications with you. 6. An IV may be inserted and sedation may be given at the discretion of the physician. 7. A blood pressure cuff, EKG, and other monitors will often be applied during the procedure.  Some patients may need to have extra oxygen administered for a short period. 8. You will be asked to provide medical information, including allergies, prior to the procedure.  We must know immediately if you are taking blood  Thinners (like Coumadin) or if you are allergic to IV iodine contrast (dye).  Possible side-effects: All are usually temporary  Bleeding from needle site  Light headedness  Numbness and tingling  Decreased blood pressure  Weakness in arms/legs  Pressure sensation in back/neck  Pain at injection site (several days)  Possible complications: All are extremely rare  Infection  Nerve injury  Spinal headache (a headache wore with upright position)  Call if you experience:  Fever/chills associated with headache or increased back/neck pain  Headache worsened by an upright position  New onset weakness or numbness of an extremity below the injection site  Hives or difficulty breathing (go to the emergency room)  Inflammation or drainage at the injection site(s)  Severe back/neck pain greater than usual  New symptoms which are concerning to you  Please note:  Although the local anesthetic injected can often make your back or neck feel good for several hours after the injection the pain will likely return.  It takes 3-5 days for steroids to work on the nerve root. You may not notice any pain relief for at least one week.  If effective, we will often do a series of 3 injections spaced 3-6 weeks apart to maximally decrease your pain.    If you have any questions, please call [phone] [facility]

## 2015-08-31 ENCOUNTER — Telehealth: Payer: Self-pay | Admitting: *Deleted

## 2015-08-31 NOTE — Telephone Encounter (Signed)
No problems post procedure phone call. 

## 2015-09-12 ENCOUNTER — Other Ambulatory Visit: Payer: Self-pay

## 2015-09-19 ENCOUNTER — Encounter: Payer: Self-pay | Admitting: Pain Medicine

## 2015-09-19 ENCOUNTER — Ambulatory Visit: Payer: Medicare Other | Attending: Pain Medicine | Admitting: Pain Medicine

## 2015-09-19 VITALS — BP 131/67 | HR 56 | Temp 99.1°F | Resp 16 | Ht 65.0 in | Wt 157.0 lb

## 2015-09-19 DIAGNOSIS — M4806 Spinal stenosis, lumbar region: Secondary | ICD-10-CM | POA: Insufficient documentation

## 2015-09-19 DIAGNOSIS — Z9889 Other specified postprocedural states: Secondary | ICD-10-CM

## 2015-09-19 DIAGNOSIS — M533 Sacrococcygeal disorders, not elsewhere classified: Secondary | ICD-10-CM | POA: Insufficient documentation

## 2015-09-19 DIAGNOSIS — M48062 Spinal stenosis, lumbar region with neurogenic claudication: Secondary | ICD-10-CM

## 2015-09-19 DIAGNOSIS — M47896 Other spondylosis, lumbar region: Secondary | ICD-10-CM | POA: Diagnosis not present

## 2015-09-19 DIAGNOSIS — M545 Low back pain: Secondary | ICD-10-CM | POA: Diagnosis present

## 2015-09-19 DIAGNOSIS — M79604 Pain in right leg: Secondary | ICD-10-CM | POA: Diagnosis present

## 2015-09-19 DIAGNOSIS — M961 Postlaminectomy syndrome, not elsewhere classified: Secondary | ICD-10-CM

## 2015-09-19 DIAGNOSIS — M47816 Spondylosis without myelopathy or radiculopathy, lumbar region: Secondary | ICD-10-CM

## 2015-09-19 DIAGNOSIS — M5136 Other intervertebral disc degeneration, lumbar region: Secondary | ICD-10-CM | POA: Insufficient documentation

## 2015-09-19 DIAGNOSIS — M79605 Pain in left leg: Secondary | ICD-10-CM | POA: Diagnosis present

## 2015-09-19 DIAGNOSIS — E1340 Other specified diabetes mellitus with diabetic neuropathy, unspecified: Secondary | ICD-10-CM

## 2015-09-19 DIAGNOSIS — M5416 Radiculopathy, lumbar region: Secondary | ICD-10-CM

## 2015-09-19 MED ORDER — OXYCODONE HCL 5 MG PO TABS: ORAL_TABLET | ORAL | Status: DC

## 2015-09-19 NOTE — Progress Notes (Signed)
   Subjective:    Patient ID: Yvette Collier, female    DOB: [birth year], 74 y.o.   MRN: [id number]  HPI Patient is 74 year old female [facility] evaluation and treatment of pain involving the region of the lower back and lower extremity regions. Patient states that she has exacerbation of her pain at this time with pain radiating from the lower back radiating to the lower extremity. Patient states that the pain becomes more intense as she spends more time on the feet. Patient states that the pain is a sharp shooting sensation that becomes more intense as the day progresses. Patient has difficulty at night as well and has difficulty sleeping due to the pain. We discussed patient's condition and will proceed with lumbosacral selective nerve root block at time return appointment in attempt to decrease severity of symptoms, minimize progression of symptoms, and avoid the need for more involved treatment. The patient was in agreement with suggested treatment plan.. The patient will continue Lyrica and oxycodone as prescribed at this time.      Review of Systems     Objective:   Physical Exam  There was tenderness over the splenius capitis and occipitalis musculature region of mild degree. There was mild tenderness of the cervical facet cervical paraspinal musculature region as well as the thoracic facet thoracic paraspinal musculature regions. The appeared to be unremarkable Spurling's maneuver. Patient appeared to be with bilaterally equal grip strength. Tinel and Phalen's maneuver were without increase of pain of significant degree. There was minimal tenderness to palpation of the acromioclavicular and glenohumeral joint regions. Palpation over the lumbar paraspinal musculature region lumbar facet region associated with moderately severe discomfort. Lateral bending and rotation and extension and palpation of the lumbar facets reproduce moderately severe discomfort. There  was tends to palpation of the PSIS and PII S region a moderate degree. Leg raising was limited to approximately 20 without a definite increased pain with dorsiflexion noted. DTRs were difficult to elicit. Patient had difficulty relaxing. There was tends to palpation of the greater trochanteric region a mild degree. There was question decreased sensation of the L5 dermatomal distribution. EHL strength appeared to be decreased. There was negative clonus negative Homans. Abdomen was nontender with no costovertebral angle tenderness noted.      Assessment & Plan:     Degenerative disc disease lumbar spine Status post surgery lumbar region with multilevel degenerative changes lumbar spine with L4-L5 level most involved. Facet arthropathy, foraminal stenosis  Lumbar radiculopathy  Lumbar facet syndrome  Sacroiliac joint dysfunction    PLAN   Continue present medication Lyrica and oxycodone  Lumbosacral selective nerve root block to be performed at time of return appointment  F/U PCP Dr. Lawerance Bach for evaliation of  BP and general medical  condition  F/U surgical evaluation. Follow-up with Dr.Cram as discussed   F/U neurological evaluation. May consider pending follow-up evaluations  May consider radiofrequency rhizolysis or intraspinal procedures pending response to present treatment and F/U evaluation   Patient to call [facility] should patient have concerns prior to scheduled return appointment.

## 2015-09-19 NOTE — Patient Instructions (Addendum)
PLAN   Continue present medication Lyrica and oxycodone  Lumbosacral selective nerve root block to be performed at time return appointment  F/U PCP Dr. Lawerance Bach for evaliation of  BP and general medical  condition  F/U surgical evaluation. Follow-up with Dr. Wynetta Emery   F/U neurological evaluation. May consider pending follow-up evaluations  May consider radiofrequency rhizolysis or intraspinal procedures pending response to present treatment and F/U evaluation   Patient to call [facility] should patient have concerns prior to scheduled return appointment. GENERAL RISKS AND COMPLICATIONS  What are the risk, side effects and possible complications? Generally speaking, most procedures are safe.  However, with any procedure there are risks, side effects, and the possibility of complications.  The risks and complications are dependent upon the sites that are lesioned, or the type of nerve block to be performed.  The closer the procedure is to the spine, the more serious the risks are.  Great care is taken when placing the radio frequency needles, block needles or lesioning probes, but sometimes complications can occur. 1. Infection: Any time there is an injection through the skin, there is a risk of infection.  This is why sterile conditions are used for these blocks.  There are four possible types of infection. 1. Localized skin infection. 2. Central Nervous System Infection-This can be in the form of Meningitis, which can be deadly. 3. Epidural Infections-This can be in the form of an epidural abscess, which can cause pressure inside of the spine, causing compression of the spinal cord with subsequent paralysis. This would require an emergency surgery to decompress, and there are no guarantees that the patient would recover from the paralysis. 4. Discitis-This is an infection of the intervertebral discs.  It occurs in about 1% of discography procedures.  It is difficult to treat and it may  lead to surgery.        2. Pain: the needles have to go through skin and soft tissues, will cause soreness.       3. Damage to internal structures:  The nerves to be lesioned may be near blood vessels or    other nerves which can be potentially damaged.       4. Bleeding: Bleeding is more common if the patient is taking blood thinners such as  aspirin, Coumadin, Ticiid, Plavix, etc., or if he/she have some genetic predisposition  such as hemophilia. Bleeding into the spinal canal can cause compression of the spinal  cord with subsequent paralysis.  This would require an emergency surgery to  decompress and there are no guarantees that the patient would recover from the  paralysis.       5. Pneumothorax:  Puncturing of a lung is a possibility, every time a needle is introduced in  the area of the chest or upper back.  Pneumothorax refers to free air around the  collapsed lung(s), inside of the thoracic cavity (chest cavity).  Another two possible  complications related to a similar event would include: Hemothorax and Chylothorax.   These are variations of the Pneumothorax, where instead of air around the collapsed  lung(s), you may have blood or chyle, respectively.       6. Spinal headaches: They may occur with any procedures in the area of the spine.       7. Persistent CSF (Cerebro-Spinal Fluid) leakage: This is a rare problem, but may occur  with prolonged intrathecal or epidural catheters either due to the formation of a fistulous  track or  a dural tear.       8. Nerve damage: By working so close to the spinal cord, there is always a possibility of  nerve damage, which could be as serious as a permanent spinal cord injury with  paralysis.       9. Death:  Although rare, severe deadly allergic reactions known as "Anaphylactic  reaction" can occur to any of the medications used.      10. Worsening of the symptoms:  We can always make thing worse.  What are the chances of something like this  happening? Chances of any of this occuring are extremely low.  By statistics, you have more of a chance of getting killed in a motor vehicle accident: while driving to the hospital than any of the above occurring .  Nevertheless, you should be aware that they are possibilities.  In general, it is similar to taking a shower.  Everybody knows that you can slip, hit your head and get killed.  Does that mean that you should not shower again?  Nevertheless always keep in mind that statistics do not mean anything if you happen to be on the wrong side of them.  Even if a procedure has a 1 (one) in a 1,000,000 (million) chance of going wrong, it you happen to be that one..Also, keep in mind that by statistics, you have more of a chance of having something go wrong when taking medications.  Who should not have this procedure? If you are on a blood thinning medication (e.g. Coumadin, Plavix, see list of "Blood Thinners"), or if you have an active infection going on, you should not have the procedure.  If you are taking any blood thinners, please inform your physician.  How should I prepare for this procedure?  Do not eat or drink anything at least six hours prior to the procedure.  Bring a driver with you .  It cannot be a taxi.  Come accompanied by an adult that can drive you back, and that is strong enough to help you if your legs get weak or numb from the local anesthetic.  Take all of your medicines the morning of the procedure with just enough water to swallow them.  If you have diabetes, make sure that you are scheduled to have your procedure done first thing in the morning, whenever possible.  If you have diabetes, take only half of your insulin dose and notify our nurse that you have done so as soon as you arrive at the clinic.  If you are diabetic, but only take blood sugar pills (oral hypoglycemic), then do not take them on the morning of your procedure.  You may take them after you have had the  procedure.  Do not take aspirin or any aspirin-containing medications, at least eleven (11) days prior to the procedure.  They may prolong bleeding.  Wear loose fitting clothing that may be easy to take off and that you would not mind if it got stained with Betadine or blood.  Do not wear any jewelry or perfume  Remove any nail coloring.  It will interfere with some of our monitoring equipment.  NOTE: Remember that this is not meant to be interpreted as a complete list of all possible complications.  Unforeseen problems may occur.  BLOOD THINNERS The following drugs contain aspirin or other products, which can cause increased bleeding during surgery and should not be taken for 2 weeks prior to and 1 week after surgery.  If  you should need take something for relief of minor pain, you may take acetaminophen which is found in Tylenol,m Datril, Anacin-3 and Panadol. It is not blood thinner. The products listed below are.  Do not take any of the products listed below in addition to any listed on your instruction sheet.  A.P.C or A.P.C with Codeine Codeine Phosphate Capsules #3 Ibuprofen Ridaura  ABC compound Congesprin Imuran rimadil  Advil Cope Indocin Robaxisal  Alka-Seltzer Effervescent Pain Reliever and Antacid Coricidin or Coricidin-D  Indomethacin Rufen  Alka-Seltzer plus Cold Medicine Cosprin Ketoprofen S-A-C Tablets  Anacin Analgesic Tablets or Capsules Coumadin Korlgesic Salflex  Anacin Extra Strength Analgesic tablets or capsules CP-2 Tablets Lanoril Salicylate  Anaprox Cuprimine Capsules Levenox Salocol  Anexsia-D Dalteparin Magan Salsalate  Anodynos Darvon compound Magnesium Salicylate Sine-off  Ansaid Dasin Capsules Magsal Sodium Salicylate  Anturane Depen Capsules Marnal Soma  APF Arthritis pain formula Dewitt's Pills Measurin Stanback  Argesic Dia-Gesic Meclofenamic Sulfinpyrazone  Arthritis Bayer Timed Release Aspirin Diclofenac Meclomen Sulindac  Arthritis pain formula  Anacin Dicumarol Medipren Supac  Analgesic (Safety coated) Arthralgen Diffunasal Mefanamic Suprofen  Arthritis Strength Bufferin Dihydrocodeine Mepro Compound Suprol  Arthropan liquid Dopirydamole Methcarbomol with Aspirin Synalgos  ASA tablets/Enseals Disalcid Micrainin Tagament  Ascriptin Doan's Midol Talwin  Ascriptin A/D Dolene Mobidin Tanderil  Ascriptin Extra Strength Dolobid Moblgesic Ticlid  Ascriptin with Codeine Doloprin or Doloprin with Codeine Momentum Tolectin  Asperbuf Duoprin Mono-gesic Trendar  Aspergum Duradyne Motrin or Motrin IB Triminicin  Aspirin plain, buffered or enteric coated Durasal Myochrisine Trigesic  Aspirin Suppositories Easprin Nalfon Trillsate  Aspirin with Codeine Ecotrin Regular or Extra Strength Naprosyn Uracel  Atromid-S Efficin Naproxen Ursinus  Auranofin Capsules Elmiron Neocylate Vanquish  Axotal Emagrin Norgesic Verin  Azathioprine Empirin or Empirin with Codeine Normiflo Vitamin E  Azolid Emprazil Nuprin Voltaren  Bayer Aspirin plain, buffered or children's or timed BC Tablets or powders Encaprin Orgaran Warfarin Sodium  Buff-a-Comp Enoxaparin Orudis Zorpin  Buff-a-Comp with Codeine Equegesic Os-Cal-Gesic   Buffaprin Excedrin plain, buffered or Extra Strength Oxalid   Bufferin Arthritis Strength Feldene Oxphenbutazone   Bufferin plain or Extra Strength Feldene Capsules Oxycodone with Aspirin   Bufferin with Codeine Fenoprofen Fenoprofen Pabalate or Pabalate-SF   Buffets II Flogesic Panagesic   Buffinol plain or Extra Strength Florinal or Florinal with Codeine Panwarfarin   Buf-Tabs Flurbiprofen Penicillamine   Butalbital Compound Four-way cold tablets Penicillin   Butazolidin Fragmin Pepto-Bismol   Carbenicillin Geminisyn Percodan   Carna Arthritis Reliever Geopen Persantine   Carprofen Gold's salt Persistin   Chloramphenicol Goody's Phenylbutazone   Chloromycetin Haltrain Piroxlcam   Clmetidine heparin Plaquenil   Cllnoril Hyco-pap  Ponstel   Clofibrate Hydroxy chloroquine Propoxyphen         Before stopping any of these medications, be sure to consult the physician who ordered them.  Some, such as Coumadin (Warfarin) are ordered to prevent or treat serious conditions such as "deep thrombosis", "pumonary embolisms", and other heart problems.  The amount of time that you may need off of the medication may also vary with the medication and the reason for which you were taking it.  If you are taking any of these medications, please make sure you notify your pain physician before you undergo any procedures.         Selective Nerve Root Block Patient Information  Description: Specific nerve roots exit the spinal canal and these nerves can be compressed and inflamed by a bulging disc and bone spurs.  By injecting steroids on  the nerve root, we can potentially decrease the inflammation surrounding these nerves, which often leads to decreased pain.  Also, by injecting local anesthesia on the nerve root, this can provide [place] helpful information to give to your referring doctor if it decreases your pain.  Selective nerve root blocks can be done along the spine from the neck to the low back depending on the location of your pain.   After numbing the skin with local anesthesia, a small needle is passed to the nerve root and the position of the needle is verified using x-ray pictures.  After the needle is in correct position, we then deposit the medication.  You may experience a pressure sensation while this is being done.  The entire block usually lasts less than 15 minutes.  Conditions that may be treated with selective nerve root blocks:  Low back and leg pain  Spinal stenosis  Diagnostic block prior to potential surgery  Neck and arm pain  Post laminectomy syndrome  Preparation for the injection:  1. Do not eat any solid food or dairy products within 6 hours of your appointment. 2. You may drink clear liquids up to 2  hours before an appointment.  Clear liquids include water, black coffee, juice or soda.  No milk or cream please. 3. You may take your regular medications, including pain medications, with a sip of water before your appointment.  Diabetics should hold regular insulin (if taken separately) and take 1/2 normal NPH dose the morning of the procedure.  Carry some sugar containing items with you to your appointment. 4. A driver must accompany you and be prepared to drive you home after your procedure. 5. Bring all your current medications with you. 6. An IV may be inserted and sedation may be given at the discretion of the physician. 7. A blood pressure cuff, EKG, and other monitors will often be applied during the procedure.  Some patients may need to have extra oxygen administered for a short period. 8. You will be asked to provide medical information, including allergies, prior to the procedure.  We must know immediately if you are taking blood  Thinners (like Coumadin) or if you are allergic to IV iodine contrast (dye).  Possible side-effects: All are usually temporary  Bleeding from needle site  Light headedness  Numbness and tingling  Decreased blood pressure  Weakness in arms/legs  Pressure sensation in back/neck  Pain at injection site (several days)  Possible complications: All are extremely rare  Infection  Nerve injury  Spinal headache (a headache wore with upright position)  Call if you experience:  Fever/chills associated with headache or increased back/neck pain  Headache worsened by an upright position  New onset weakness or numbness of an extremity below the injection site  Hives or difficulty breathing (go to the emergency room)  Inflammation or drainage at the injection site(s)  Severe back/neck pain greater than usual  New symptoms which are concerning to you  Please note:  Although the local anesthetic injected can often make your back or neck feel  good for several hours after the injection the pain will likely return.  It takes 3-5 days for steroids to work on the nerve root. You may not notice any pain relief for at least one week.  If effective, we will often do a series of 3 injections spaced 3-6 weeks apart to maximally decrease your pain.    If you have any questions, please call [phone] [facility]

## 2015-09-19 NOTE — Progress Notes (Signed)
Safety precautions to be maintained throughout the outpatient stay will include: orient to surroundings, keep bed in low position, maintain call bell within reach at all times, provide assistance with transfer out of bed and ambulation.  

## 2015-09-27 ENCOUNTER — Ambulatory Visit: Payer: Medicare Other | Admitting: Pain Medicine

## 2015-09-27 ENCOUNTER — Other Ambulatory Visit: Payer: Self-pay | Admitting: Pain Medicine

## 2015-10-04 ENCOUNTER — Ambulatory Visit: Payer: Medicare Other | Attending: Pain Medicine | Admitting: Pain Medicine

## 2015-10-04 ENCOUNTER — Encounter: Payer: Self-pay | Admitting: Pain Medicine

## 2015-10-04 VITALS — BP 121/60 | HR 55 | Temp 98.3°F | Resp 18 | Ht 65.0 in | Wt 158.0 lb

## 2015-10-04 DIAGNOSIS — Z9889 Other specified postprocedural states: Secondary | ICD-10-CM | POA: Insufficient documentation

## 2015-10-04 DIAGNOSIS — M48062 Spinal stenosis, lumbar region with neurogenic claudication: Secondary | ICD-10-CM

## 2015-10-04 DIAGNOSIS — M4806 Spinal stenosis, lumbar region: Secondary | ICD-10-CM | POA: Insufficient documentation

## 2015-10-04 DIAGNOSIS — M533 Sacrococcygeal disorders, not elsewhere classified: Secondary | ICD-10-CM

## 2015-10-04 DIAGNOSIS — M961 Postlaminectomy syndrome, not elsewhere classified: Secondary | ICD-10-CM

## 2015-10-04 DIAGNOSIS — M545 Low back pain: Secondary | ICD-10-CM | POA: Diagnosis present

## 2015-10-04 DIAGNOSIS — M47816 Spondylosis without myelopathy or radiculopathy, lumbar region: Secondary | ICD-10-CM

## 2015-10-04 DIAGNOSIS — M5136 Other intervertebral disc degeneration, lumbar region: Secondary | ICD-10-CM | POA: Diagnosis not present

## 2015-10-04 DIAGNOSIS — E1340 Other specified diabetes mellitus with diabetic neuropathy, unspecified: Secondary | ICD-10-CM

## 2015-10-04 DIAGNOSIS — M5416 Radiculopathy, lumbar region: Secondary | ICD-10-CM

## 2015-10-04 MED ORDER — BUPIVACAINE HCL (PF) 0.25 % IJ SOLN
INTRAMUSCULAR | Status: AC
  Administered 2015-10-04: 30 mL
  Filled 2015-10-04: qty 30

## 2015-10-04 MED ORDER — TRIAMCINOLONE ACETONIDE 40 MG/ML IJ SUSP
40.0000 mg | Freq: Once | INTRAMUSCULAR | Status: AC
  Administered 2015-10-04: 10 mg

## 2015-10-04 MED ORDER — ORPHENADRINE CITRATE 30 MG/ML IJ SOLN
60.0000 mg | Freq: Once | INTRAMUSCULAR | Status: AC
  Administered 2015-10-04: 60 mg via INTRAMUSCULAR

## 2015-10-04 MED ORDER — SODIUM CHLORIDE 0.9 % IJ SOLN: 20.0000 mL | Freq: Once | INTRAMUSCULAR | Status: DC

## 2015-10-04 MED ORDER — BUPIVACAINE HCL (PF) 0.25 % IJ SOLN: 30.0000 mL | Freq: Once | INTRAMUSCULAR | Status: DC

## 2015-10-04 MED ORDER — CEFUROXIME AXETIL 250 MG PO TABS: 250.0000 mg | ORAL_TABLET | Freq: Two times a day (BID) | ORAL | Status: DC

## 2015-10-04 MED ORDER — MIDAZOLAM HCL 5 MG/5ML IJ SOLN
5.0000 mg | Freq: Once | INTRAMUSCULAR | Status: AC
  Administered 2015-10-04: 3 mg via INTRAVENOUS

## 2015-10-04 MED ORDER — CEFAZOLIN SODIUM 1 G IJ SOLR
INTRAMUSCULAR | Status: AC
  Administered 2015-10-04: 1 g via INTRAVENOUS
  Filled 2015-10-04: qty 10

## 2015-10-04 MED ORDER — FENTANYL CITRATE (PF) 100 MCG/2ML IJ SOLN
INTRAMUSCULAR | Status: AC
  Administered 2015-10-04: 50 ug via INTRAVENOUS
  Filled 2015-10-04: qty 2

## 2015-10-04 MED ORDER — LIDOCAINE HCL (PF) 1 % IJ SOLN: 10.0000 mL | Freq: Once | INTRAMUSCULAR | Status: DC

## 2015-10-04 MED ORDER — ORPHENADRINE CITRATE 30 MG/ML IJ SOLN
INTRAMUSCULAR | Status: AC
  Administered 2015-10-04: 60 mg via INTRAMUSCULAR
  Filled 2015-10-04: qty 2

## 2015-10-04 MED ORDER — MIDAZOLAM HCL 5 MG/5ML IJ SOLN
INTRAMUSCULAR | Status: AC
  Administered 2015-10-04: 3 mg via INTRAVENOUS
  Filled 2015-10-04: qty 5

## 2015-10-04 MED ORDER — FENTANYL CITRATE (PF) 100 MCG/2ML IJ SOLN
100.0000 ug | Freq: Once | INTRAMUSCULAR | Status: AC
  Administered 2015-10-04: 50 ug via INTRAVENOUS

## 2015-10-04 MED ORDER — CEFAZOLIN SODIUM 1-5 GM-% IV SOLN: 1.0000 g | Freq: Once | INTRAVENOUS | Status: DC

## 2015-10-04 MED ORDER — TRIAMCINOLONE ACETONIDE 40 MG/ML IJ SUSP
INTRAMUSCULAR | Status: AC
  Administered 2015-10-04: 10 mg
  Filled 2015-10-04: qty 1

## 2015-10-04 MED ORDER — LACTATED RINGERS IV SOLN: 1000.0000 mL | INTRAVENOUS | Status: DC

## 2015-10-04 NOTE — Progress Notes (Signed)
Safety precautions to be maintained throughout the outpatient stay will include: orient to surroundings, keep bed in low position, maintain call bell within reach at all times, provide assistance with transfer out of bed and ambulation.  

## 2015-10-04 NOTE — Progress Notes (Signed)
Subjective:    Patient ID: Yvette Collier, female    DOB: Nov 06, 1941, 74 y.o.   MRN: [id number]  HPI PROCEDURE PERFORMED: Lumbosacral selective nerve root block   NOTE: The patient is a 74 y.o. female who [facility] for further evaluation and treatment of pain involving the lumbar and lower extremity region. Studies consisting of MRI has revealed the patient to be with evidence of degenerative disc disease lumbar spine Status post surgery lumbar region with multilevel degenerative changes lumbar spine with L4-L5 level most involved. Facet arthropathy, foraminal stenosis. There is concern regarding intraspinal abnormalities contributing to the patient's symptomatology. The risks, benefits, and expectations of the procedure have been explained to the patient who was understanding and in agreement with suggested treatment plan. We will proceed with interventional treatment as discussed and as explained to the patient. The patient is understanding and in agreement with suggested treatment plan.   DESCRIPTION OF PROCEDURE: Lumbosacral selective nerve root block with IV Versed, IV fentanyl conscious sedation, EKG, blood pressure, pulse, and pulse oximetry monitoring. The procedure was performed with the patient in the prone position under fluoroscopic guidance. With the patient in the prone position, Betadine prep of proposed entry site was performed. Local anesthetic skin wheal of proposed needle entry site was prepared with 1.5% plain lidocaine with AP view of the lumbosacral spine.   PROCEDURE #1: Needle placement at the left L 2 vertebral body: A 22 -gauge needle was inserted at the inferior border of the transverse process of the vertebral body with needle placed medial to the midline of the transverse process on AP view of the lumbosacral spine.   NEEDLE PLACEMENT AT  L3-L4 and L5  VERTEBRAL BODY LEVELS  Needle  placement was accomplished at L3-L4 and L5  vertebral body levels  on the left side exactly as was accomplished at the L2  vertebral body level  and utilizing the same technique and under fluoroscopic guidance.    Needle placement was then verified on lateral view at all levels with needle tip documented to be in the posterior superior quadrant of the intervertebral foramen of  L 2, L3, L4, and L5. Following negative aspiration for heme and CSF at each level, each level was injected with 3 mL of 0.25% bupivacaine with Kenalog.   Myoneural block injections of the lumbar paraspinal musculature region Following Betadine prep of proposed entry site a 22-gauge needle was inserted in the lumbar paraspinal musculature region and following negative aspiration 2 cc of 0.25% bupivacaine with Norflex was injected for myoneural block injections of the lumbar paraspinal musculature region 4  The patient tolerated the procedure well. A total of 10 mg of Kenalog was utilized for the procedure.   PLAN:  1. Medications: Will continue presently prescribed medications Lyrica and oxycodone. 2. The patient is to undergo follow-up evaluation with PCP Dr. Lawerance Bach for evaluation of blood pressure and general medical condition status post procedure performed on today's visit. 3. Surgical follow-up evaluation. Patient will follow-up with Dr. Wynetta Emery as discussed 4. Neurological evaluation. May consider 5. May consider radiofrequency procedures, implantation type procedures and other treatment pending response to treatment and follow-up evaluation. 6. The patient has been advise do adhere to proper body mechanics and avoid activities which may aggravate condition. 7. The patient has been advised to call the [facility] prior to scheduled return appointment should there be significant change in the patient's condition or should the patient have other concerns regarding condition prior  to scheduled return appointment.      Review of Systems     Objective:   Physical  Exam        Assessment & Plan:

## 2015-10-04 NOTE — Patient Instructions (Addendum)
PLAN  Continue present medication Lyrica and oxycodone and begin taking antibiotic Ceftin as prescribed. Please obtain your antibiotic today and begin taking antibiotic today  F/U PCP Dr. Lawerance Bach for evaliation of  BP and general medical  condition.  F/U surgical evaluation. Patient will follow-up with Dr.Cram   F/U neurological evaluation. May consider pending follow-up evaluations  May consider radiofrequency rhizolysis or intraspinal procedures pending response to present treatment and F/U evaluation.  Patient to call [facility] Management Center should patient have concerns prior to scheduled return appointment.  GENERAL RISKS AND COMPLICATIONS  What are the risk, side effects and possible complications? Generally speaking, most procedures are safe.  However, with any procedure there are risks, side effects, and the possibility of complications.  The risks and complications are dependent upon the sites that are lesioned, or the type of nerve block to be performed.  The closer the procedure is to the spine, the more serious the risks are.  Great care is taken when placing the radio frequency needles, block needles or lesioning probes, but sometimes complications can occur. 1. Infection: Any time there is an injection through the skin, there is a risk of infection.  This is why sterile conditions are used for these blocks.  There are four possible types of infection. 1. Localized skin infection. 2. Central Nervous System Infection-This can be in the form of Meningitis, which can be deadly. 3. Epidural Infections-This can be in the form of an epidural abscess, which can cause pressure inside of the spine, causing compression of the spinal cord with subsequent paralysis. This would require an emergency surgery to decompress, and there are no guarantees that the patient would recover from the paralysis. 4. Discitis-This is an infection of the intervertebral discs.  It occurs in about 1% of discography  procedures.  It is difficult to treat and it may lead to surgery.        2. Pain: the needles have to go through skin and soft tissues, will cause soreness.       3. Damage to internal structures:  The nerves to be lesioned may be near blood vessels or    other nerves which can be potentially damaged.       4. Bleeding: Bleeding is more common if the patient is taking blood thinners such as  aspirin, Coumadin, Ticiid, Plavix, etc., or if he/she have some genetic predisposition  such as hemophilia. Bleeding into the spinal canal can cause compression of the spinal  cord with subsequent paralysis.  This would require an emergency surgery to  decompress and there are no guarantees that the patient would recover from the  paralysis.       5. Pneumothorax:  Puncturing of a lung is a possibility, every time a needle is introduced in  the area of the chest or upper back.  Pneumothorax refers to free air around the  collapsed lung(s), inside of the thoracic cavity (chest cavity).  Another two possible  complications related to a similar event would include: Hemothorax and Chylothorax.   These are variations of the Pneumothorax, where instead of air around the collapsed  lung(s), you may have blood or chyle, respectively.       6. Spinal headaches: They may occur with any procedures in the area of the spine.       7. Persistent CSF (Cerebro-Spinal Fluid) leakage: This is a rare problem, but may occur  with prolonged intrathecal or epidural catheters either due to the formation of a  fistulous  track or a dural tear.       8. Nerve damage: By working so close to the spinal cord, there is always a possibility of  nerve damage, which could be as serious as a permanent spinal cord injury with  paralysis.       9. Death:  Although rare, severe deadly allergic reactions known as "Anaphylactic  reaction" can occur to any of the medications used.      10. Worsening of the symptoms:  We can always make thing worse.  What  are the chances of something like this happening? Chances of any of this occuring are extremely low.  By statistics, you have more of a chance of getting killed in a motor vehicle accident: while driving to the hospital than any of the above occurring .  Nevertheless, you should be aware that they are possibilities.  In general, it is similar to taking a shower.  Everybody knows that you can slip, hit your head and get killed.  Does that mean that you should not shower again?  Nevertheless always keep in mind that statistics do not mean anything if you happen to be on the wrong side of them.  Even if a procedure has a 1 (one) in a 1,000,000 (million) chance of going wrong, it you happen to be that one..Also, keep in mind that by statistics, you have more of a chance of having something go wrong when taking medications.  Who should not have this procedure? If you are on a blood thinning medication (e.g. Coumadin, Plavix, see list of "Blood Thinners"), or if you have an active infection going on, you should not have the procedure.  If you are taking any blood thinners, please inform your physician.  How should I prepare for this procedure?  Do not eat or drink anything at least six hours prior to the procedure.  Bring a driver with you .  It cannot be a taxi.  Come accompanied by an adult that can drive you back, and that is strong enough to help you if your legs get weak or numb from the local anesthetic.  Take all of your medicines the morning of the procedure with just enough water to swallow them.  If you have diabetes, make sure that you are scheduled to have your procedure done first thing in the morning, whenever possible.  If you have diabetes, take only half of your insulin dose and notify our nurse that you have done so as soon as you arrive at the clinic.  If you are diabetic, but only take blood sugar pills (oral hypoglycemic), then do not take them on the morning of your procedure.   You may take them after you have had the procedure.  Do not take aspirin or any aspirin-containing medications, at least eleven (11) days prior to the procedure.  They may prolong bleeding.  Wear loose fitting clothing that may be easy to take off and that you would not mind if it got stained with Betadine or blood.  Do not wear any jewelry or perfume  Remove any nail coloring.  It will interfere with some of our monitoring equipment.  NOTE: Remember that this is not meant to be interpreted as a complete list of all possible complications.  Unforeseen problems may occur.  BLOOD THINNERS The following drugs contain aspirin or other products, which can cause increased bleeding during surgery and should not be taken for 2 weeks prior to and 1 week  after surgery.  If you should need take something for relief of minor pain, you may take acetaminophen which is found in Tylenol,m Datril, Anacin-3 and Panadol. It is not blood thinner. The products listed below are.  Do not take any of the products listed below in addition to any listed on your instruction sheet.  A.P.C or A.P.C with Codeine Codeine Phosphate Capsules #3 Ibuprofen Ridaura  ABC compound Congesprin Imuran rimadil  Advil Cope Indocin Robaxisal  Alka-Seltzer Effervescent Pain Reliever and Antacid Coricidin or Coricidin-D  Indomethacin Rufen  Alka-Seltzer plus Cold Medicine Cosprin Ketoprofen S-A-C Tablets  Anacin Analgesic Tablets or Capsules Coumadin Korlgesic Salflex  Anacin Extra Strength Analgesic tablets or capsules CP-2 Tablets Lanoril Salicylate  Anaprox Cuprimine Capsules Levenox Salocol  Anexsia-D Dalteparin Magan Salsalate  Anodynos Darvon compound Magnesium Salicylate Sine-off  Ansaid Dasin Capsules Magsal Sodium Salicylate  Anturane Depen Capsules Marnal Soma  APF Arthritis pain formula Dewitt's Pills Measurin Stanback  Argesic Dia-Gesic Meclofenamic Sulfinpyrazone  Arthritis Bayer Timed Release Aspirin Diclofenac  Meclomen Sulindac  Arthritis pain formula Anacin Dicumarol Medipren Supac  Analgesic (Safety coated) Arthralgen Diffunasal Mefanamic Suprofen  Arthritis Strength Bufferin Dihydrocodeine Mepro Compound Suprol  Arthropan liquid Dopirydamole Methcarbomol with Aspirin Synalgos  ASA tablets/Enseals Disalcid Micrainin Tagament  Ascriptin Doan's Midol Talwin  Ascriptin A/D Dolene Mobidin Tanderil  Ascriptin Extra Strength Dolobid Moblgesic Ticlid  Ascriptin with Codeine Doloprin or Doloprin with Codeine Momentum Tolectin  Asperbuf Duoprin Mono-gesic Trendar  Aspergum Duradyne Motrin or Motrin IB Triminicin  Aspirin plain, buffered or enteric coated Durasal Myochrisine Trigesic  Aspirin Suppositories Easprin Nalfon Trillsate  Aspirin with Codeine Ecotrin Regular or Extra Strength Naprosyn Uracel  Atromid-S Efficin Naproxen Ursinus  Auranofin Capsules Elmiron Neocylate Vanquish  Axotal Emagrin Norgesic Verin  Azathioprine Empirin or Empirin with Codeine Normiflo Vitamin E  Azolid Emprazil Nuprin Voltaren  Bayer Aspirin plain, buffered or children's or timed BC Tablets or powders Encaprin Orgaran Warfarin Sodium  Buff-a-Comp Enoxaparin Orudis Zorpin  Buff-a-Comp with Codeine Equegesic Os-Cal-Gesic   Buffaprin Excedrin plain, buffered or Extra Strength Oxalid   Bufferin Arthritis Strength Feldene Oxphenbutazone   Bufferin plain or Extra Strength Feldene Capsules Oxycodone with Aspirin   Bufferin with Codeine Fenoprofen Fenoprofen Pabalate or Pabalate-SF   Buffets II Flogesic Panagesic   Buffinol plain or Extra Strength Florinal or Florinal with Codeine Panwarfarin   Buf-Tabs Flurbiprofen Penicillamine   Butalbital Compound Four-way cold tablets Penicillin   Butazolidin Fragmin Pepto-Bismol   Carbenicillin Geminisyn Percodan   Carna Arthritis Reliever Geopen Persantine   Carprofen Gold's salt Persistin   Chloramphenicol Goody's Phenylbutazone   Chloromycetin Haltrain Piroxlcam   Clmetidine  heparin Plaquenil   Cllnoril Hyco-pap Ponstel   Clofibrate Hydroxy chloroquine Propoxyphen         Before stopping any of these medications, be sure to consult the physician who ordered them.  Some, such as Coumadin (Warfarin) are ordered to prevent or treat serious conditions such as "deep thrombosis", "pumonary embolisms", and other heart problems.  The amount of time that you may need off of the medication may also vary with the medication and the reason for which you were taking it.  If you are taking any of these medications, please make sure you notify your pain physician before you undergo any procedures.         Selective Nerve Root Block Patient Information  Description: Specific nerve roots exit the spinal canal and these nerves can be compressed and inflamed by a bulging disc and bone spurs.  By injecting steroids on the nerve root, we can potentially decrease the inflammation surrounding these nerves, which often leads to decreased pain.  Also, by injecting local anesthesia on the nerve root, this can provide [place] helpful information to give to your referring doctor if it decreases your pain.  Selective nerve root blocks can be done along the spine from the neck to the low back depending on the location of your pain.   After numbing the skin with local anesthesia, a small needle is passed to the nerve root and the position of the needle is verified using x-ray pictures.  After the needle is in correct position, we then deposit the medication.  You may experience a pressure sensation while this is being done.  The entire block usually lasts less than 15 minutes.  Conditions that may be treated with selective nerve root blocks:  Low back and leg pain  Spinal stenosis  Diagnostic block prior to potential surgery  Neck and arm pain  Post laminectomy syndrome  Preparation for the injection:  1. Do not eat any solid food or dairy products within 6 hours of your  appointment. 2. You may drink clear liquids up to 2 hours before an appointment.  Clear liquids include water, black coffee, juice or soda.  No milk or cream please. 3. You may take your regular medications, including pain medications, with a sip of water before your appointment.  Diabetics should hold regular insulin (if taken separately) and take 1/2 normal NPH dose the morning of the procedure.  Carry some sugar containing items with you to your appointment. 4. A driver must accompany you and be prepared to drive you home after your procedure. 5. Bring all your current medications with you. 6. An IV may be inserted and sedation may be given at the discretion of the physician. 7. A blood pressure cuff, EKG, and other monitors will often be applied during the procedure.  Some patients may need to have extra oxygen administered for a short period. 8. You will be asked to provide medical information, including allergies, prior to the procedure.  We must know immediately if you are taking blood  Thinners (like Coumadin) or if you are allergic to IV iodine contrast (dye).  Possible side-effects: All are usually temporary  Bleeding from needle site  Light headedness  Numbness and tingling  Decreased blood pressure  Weakness in arms/legs  Pressure sensation in back/neck  Pain at injection site (several days)  Possible complications: All are extremely rare  Infection  Nerve injury  Spinal headache (a headache wore with upright position)  Call if you experience:  Fever/chills associated with headache or increased back/neck pain  Headache worsened by an upright position  New onset weakness or numbness of an extremity below the injection site  Hives or difficulty breathing (go to the emergency room)  Inflammation or drainage at the injection site(s)  Severe back/neck pain greater than usual  New symptoms which are concerning to you  Please note:  Although the local  anesthetic injected can often make your back or neck feel good for several hours after the injection the pain will likely return.  It takes 3-5 days for steroids to work on the nerve root. You may not notice any pain relief for at least one week.  If effective, we will often do a series of 3 injections spaced 3-6 weeks apart to maximally decrease your pain.    If you have any questions, please call [phone] [place]  [facility]  General Discharge Instructions :  If you need to reach your doctor call: Monday-Friday 8:00 am - 4:00 pm at [phone] or toll free [phone].  After clinic hours [phone] to have operator reach doctor.  Bring all of your medication bottles to all your appointments in the pain clinic.  To cancel or reschedule your appointment with Pain Management please remember to call 24 hours in advance to avoid a fee.  Refer to the educational materials which you have been given on: General Risks, I had my Procedure. Discharge Instructions, Post Sedation.  Post Procedure Instructions:  The drugs you were given will stay in your system until tomorrow, so for the next 24 hours you should not drive, make any legal decisions or drink any alcoholic beverages.  You may eat anything you prefer, but it is better to start with liquids then soups and crackers, and gradually work up to solid foods.  Please notify your doctor immediately if you have any unusual bleeding, trouble breathing or pain that is not related to your normal pain.  Depending on the type of procedure that was done, some parts of your body may feel week and/or numb.  This usually clears up by tonight or the next day.  Walk with the use of an assistive device or accompanied by an adult for the 24 hours.  You may use ice on the affected area for the first 24 hours.  Put ice in a Ziploc bag and cover with a towel and place against area 15  minutes on 15 minutes off.  You may switch to heat after 24 hours.

## 2015-10-05 ENCOUNTER — Telehealth: Payer: Self-pay | Admitting: *Deleted

## 2015-10-05 NOTE — Telephone Encounter (Signed)
No problems post procedure phone call. 

## 2015-10-19 ENCOUNTER — Ambulatory Visit: Payer: Medicare Other | Attending: Pain Medicine | Admitting: Pain Medicine

## 2015-10-19 ENCOUNTER — Encounter: Payer: Self-pay | Admitting: Pain Medicine

## 2015-10-19 VITALS — BP 141/76 | HR 56 | Temp 98.8°F | Resp 15 | Ht 65.0 in | Wt 157.0 lb

## 2015-10-19 DIAGNOSIS — M48062 Spinal stenosis, lumbar region with neurogenic claudication: Secondary | ICD-10-CM

## 2015-10-19 DIAGNOSIS — M47816 Spondylosis without myelopathy or radiculopathy, lumbar region: Secondary | ICD-10-CM

## 2015-10-19 DIAGNOSIS — M5416 Radiculopathy, lumbar region: Secondary | ICD-10-CM | POA: Diagnosis not present

## 2015-10-19 DIAGNOSIS — M5136 Other intervertebral disc degeneration, lumbar region: Secondary | ICD-10-CM | POA: Diagnosis not present

## 2015-10-19 DIAGNOSIS — M961 Postlaminectomy syndrome, not elsewhere classified: Secondary | ICD-10-CM

## 2015-10-19 DIAGNOSIS — M533 Sacrococcygeal disorders, not elsewhere classified: Secondary | ICD-10-CM

## 2015-10-19 DIAGNOSIS — Z9889 Other specified postprocedural states: Secondary | ICD-10-CM | POA: Insufficient documentation

## 2015-10-19 DIAGNOSIS — E1340 Other specified diabetes mellitus with diabetic neuropathy, unspecified: Secondary | ICD-10-CM

## 2015-10-19 DIAGNOSIS — M79605 Pain in left leg: Secondary | ICD-10-CM | POA: Diagnosis not present

## 2015-10-19 DIAGNOSIS — M7060 Trochanteric bursitis, unspecified hip: Secondary | ICD-10-CM | POA: Insufficient documentation

## 2015-10-19 MED ORDER — OXYCODONE HCL 5 MG PO TABS: ORAL_TABLET | ORAL | Status: DC

## 2015-10-19 NOTE — Progress Notes (Signed)
   Subjective:    Patient ID: Yvette Collier, female    DOB: July 25, 1941, 74 y.o.   MRN: [id number]  HPI  Patient is 74 year old female who returns to pain management for further evaluation and treatment of pain involving the region of the lower back and lower extremity region predominantly patient states that she has pain occurring in the region of the lower extremity on the left that interferes with patient's ability to lie on the left side and to obtain restful sleep. Patient felt to be with component of greater trochanteric bursitis. The patient continues medications of Lyrica and oxycodone and despite present treatment regimen continues to have severely disabling pain of the left lower extremity in the greater trochanteric region. We will proceed with greater trochanteric bursa injection at time return appointment in attempt to decrease severity of symptoms, minimize progression of symptoms, and avoid need for more involved treatment. The patient was with understanding and in agreement with suggested treatment plan  Review of Systems     Objective:   Physical Exam there was mild tenderness of the splenius capitis Cipro talus musculature region. There was mild tenderness of the cervical facet cervical paraspinal musculature region. Mild tends to palpation of the acromioclavicular and glenohumeral joint regions. Patient appeared to be with bilaterally equal grip strength. Tinel and Phalen's maneuver were without increased pain of significant degree. Palpation over the lower thoracic paraspinal musculature region lumbar facet region was a tends to palpation of moderate degree. There was tenderness to palpation of the greater trochanteric region and iliotibial band region of severe degree on the left. Palpation of the PSIS and PII S region reproduced moderate discomfort. There was severe increase of pain with pressure prior to the ilium with patient in lateral decubitus position as well as severe increase  of pain with palpation of the greater trochanteric region and iliotibial band region on the left especially Straight leg raising was tolerates approximately 20 without a definite increased pain with dorsiflexion noted. There appeared to be negative clonus negative Homans. DTRs were difficult this patient had difficulty relaxing. No definite sensory deficit of dermatomal distribution detected. Lateral bending and rotation and extension and palpation of the lumbar facets reproduce moderate discomfort. There was negative clonus negative Homans.      Assessment & Plan:   Greater trochanteric bursitis  Degenerative disc disease lumbar spine Status post surgery lumbar region with multilevel degenerative changes lumbar spine with L4-L5 level most involved. Facet arthropathy, foraminal stenosis  Lumbar radiculopathy  Lumbar facet syndrome  Sacroiliac joint dysfunction    PLAN  Continue present medication Lyrica and oxycodone  Greater trochanteric bursa injection to be performed at time return appointment  F/U PCP Dr. Lawerance BachBurns for evaliation of  BP and general medical  condition  F/U surgical evaluation. Follow-up with Dr.Cram as discussed   F/U neurological evaluation. May consider pending follow-up evaluations  May consider radiofrequency rhizolysis or intraspinal procedures pending response to present treatment and F/U evaluation   Patient to call Pain Management Center should patient have concerns prior to scheduled return appointment.

## 2015-10-19 NOTE — Patient Instructions (Addendum)
PLAN   Continue present medication Lyrica and oxycodone   Greater trochanteric bursa injection to be performed at time return appointment  F/U PCP Dr. Lawerance Bach for evaliation of  BP and general medical  condition.  F/U surgical evaluation. Patient will follow-up with Dr.Cram   F/U neurological evaluation. May consider pending follow-up evaluations  May consider radiofrequency rhizolysis or intraspinal procedures pending response to present treatment and F/U evaluation.  Patient to call [facility] should patient have concerns prior to scheduled return appointment. Pain Management Discharge Instructions  General Discharge Instructions :  If you need to reach your doctor call: Monday-Friday 8:00 am - 4:00 pm at [phone] or toll free [phone].  After clinic hours [phone] to have operator reach doctor.  Bring all of your medication bottles to all your appointments in the pain clinic.  To cancel or reschedule your appointment with Pain Management please remember to call 24 hours in advance to avoid a fee.  Refer to the educational materials which you have been given on: General Risks, I had my Procedure. Discharge Instructions, Post Sedation.  Post Procedure Instructions:  The drugs you were given will stay in your system until tomorrow, so for the next 24 hours you should not drive, make any legal decisions or drink any alcoholic beverages.  You may eat anything you prefer, but it is better to start with liquids then soups and crackers, and gradually work up to solid foods.  Please notify your doctor immediately if you have any unusual bleeding, trouble breathing or pain that is not related to your normal pain.  Depending on the type of procedure that was done, some parts of your body may feel week and/or numb.  This usually clears up by tonight or the next day.  Walk with the use of an assistive device or accompanied by an adult for the 24 hours.  You may  use ice on the affected area for the first 24 hours.  Put ice in a Ziploc bag and cover with a towel and place against area 15 minutes on 15 minutes off.  You may switch to heat after 24 hours.GENERAL RISKS AND COMPLICATIONS  What are the risk, side effects and possible complications? Generally speaking, most procedures are safe.  However, with any procedure there are risks, side effects, and the possibility of complications.  The risks and complications are dependent upon the sites that are lesioned, or the type of nerve block to be performed.  The closer the procedure is to the spine, the more serious the risks are.  Great care is taken when placing the radio frequency needles, block needles or lesioning probes, but sometimes complications can occur.  Infection: Any time there is an injection through the skin, there is a risk of infection.  This is why sterile conditions are used for these blocks.  There are four possible types of infection.  Localized skin infection.  Central Nervous System Infection-This can be in the form of Meningitis, which can be deadly.  Epidural Infections-This can be in the form of an epidural abscess, which can cause pressure inside of the spine, causing compression of the spinal cord with subsequent paralysis. This would require an emergency surgery to decompress, and there are no guarantees that the patient would recover from the paralysis.  Discitis-This is an infection of the intervertebral discs.  It occurs in about 1% of discography procedures.  It is difficult to treat and it may lead to surgery.  2. Pain: the needles have to go through skin and soft tissues, will cause soreness.       3. Damage to internal structures:  The nerves to be lesioned may be near blood vessels or    other nerves which can be potentially damaged.       4. Bleeding: Bleeding is more common if the patient is taking blood thinners such as  aspirin, Coumadin, Ticiid, Plavix, etc., or  if he/she have some genetic predisposition  such as hemophilia. Bleeding into the spinal canal can cause compression of the spinal  cord with subsequent paralysis.  This would require an emergency surgery to  decompress and there are no guarantees that the patient would recover from the  paralysis.       5. Pneumothorax:  Puncturing of a lung is a possibility, every time a needle is introduced in  the area of the chest or upper back.  Pneumothorax refers to free air around the  collapsed lung(s), inside of the thoracic cavity (chest cavity).  Another two possible  complications related to a similar event would include: Hemothorax and Chylothorax.   These are variations of the Pneumothorax, where instead of air around the collapsed  lung(s), you may have blood or chyle, respectively.       6. Spinal headaches: They may occur with any procedures in the area of the spine.       7. Persistent CSF (Cerebro-Spinal Fluid) leakage: This is a rare problem, but may occur  with prolonged intrathecal or epidural catheters either due to the formation of a fistulous  track or a dural tear.       8. Nerve damage: By working so close to the spinal cord, there is always a possibility of  nerve damage, which could be as serious as a permanent spinal cord injury with  paralysis.       9. Death:  Although rare, severe deadly allergic reactions known as "Anaphylactic  reaction" can occur to any of the medications used.      10. Worsening of the symptoms:  We can always make thing worse.  What are the chances of something like this happening? Chances of any of this occuring are extremely low.  By statistics, you have more of a chance of getting killed in a motor vehicle accident: while driving to the hospital than any of the above occurring .  Nevertheless, you should be aware that they are possibilities.  In general, it is similar to taking a shower.  Everybody knows that you can slip, hit your head and get killed.  Does that  mean that you should not shower again?  Nevertheless always keep in mind that statistics do not mean anything if you happen to be on the wrong side of them.  Even if a procedure has a 1 (one) in a 1,000,000 (million) chance of going wrong, it you happen to be that one..Also, keep in mind that by statistics, you have more of a chance of having something go wrong when taking medications.  Who should not have this procedure? If you are on a blood thinning medication (e.g. Coumadin, Plavix, see list of "Blood Thinners"), or if you have an active infection going on, you should not have the procedure.  If you are taking any blood thinners, please inform your physician.  How should I prepare for this procedure?  Do not eat or drink anything at least six hours prior to the procedure.  Bring a driver  with you .  It cannot be a taxi.  Come accompanied by an adult that can drive you back, and that is strong enough to help you if your legs get weak or numb from the local anesthetic.  Take all of your medicines the morning of the procedure with just enough water to swallow them.  If you have diabetes, make sure that you are scheduled to have your procedure done first thing in the morning, whenever possible.  If you have diabetes, take only half of your insulin dose and notify our nurse that you have done so as soon as you arrive at the clinic.  If you are diabetic, but only take blood sugar pills (oral hypoglycemic), then do not take them on the morning of your procedure.  You may take them after you have had the procedure.  Do not take aspirin or any aspirin-containing medications, at least eleven (11) days prior to the procedure.  They may prolong bleeding.  Wear loose fitting clothing that may be easy to take off and that you would not mind if it got stained with Betadine or blood.  Do not wear any jewelry or perfume  Remove any nail coloring.  It will interfere with some of our monitoring  equipment.  NOTE: Remember that this is not meant to be interpreted as a complete list of all possible complications.  Unforeseen problems may occur.  BLOOD THINNERS The following drugs contain aspirin or other products, which can cause increased bleeding during surgery and should not be taken for 2 weeks prior to and 1 week after surgery.  If you should need take something for relief of minor pain, you may take acetaminophen which is found in Tylenol,m Datril, Anacin-3 and Panadol. It is not blood thinner. The products listed below are.  Do not take any of the products listed below in addition to any listed on your instruction sheet.  A.P.C or A.P.C with Codeine Codeine Phosphate Capsules #3 Ibuprofen Ridaura  ABC compound Congesprin Imuran rimadil  Advil Cope Indocin Robaxisal  Alka-Seltzer Effervescent Pain Reliever and Antacid Coricidin or Coricidin-D  Indomethacin Rufen  Alka-Seltzer plus Cold Medicine Cosprin Ketoprofen S-A-C Tablets  Anacin Analgesic Tablets or Capsules Coumadin Korlgesic Salflex  Anacin Extra Strength Analgesic tablets or capsules CP-2 Tablets Lanoril Salicylate  Anaprox Cuprimine Capsules Levenox Salocol  Anexsia-D Dalteparin Magan Salsalate  Anodynos Darvon compound Magnesium Salicylate Sine-off  Ansaid Dasin Capsules Magsal Sodium Salicylate  Anturane Depen Capsules Marnal Soma  APF Arthritis pain formula Dewitt's Pills Measurin Stanback  Argesic Dia-Gesic Meclofenamic Sulfinpyrazone  Arthritis Bayer Timed Release Aspirin Diclofenac Meclomen Sulindac  Arthritis pain formula Anacin Dicumarol Medipren Supac  Analgesic (Safety coated) Arthralgen Diffunasal Mefanamic Suprofen  Arthritis Strength Bufferin Dihydrocodeine Mepro Compound Suprol  Arthropan liquid Dopirydamole Methcarbomol with Aspirin Synalgos  ASA tablets/Enseals Disalcid Micrainin Tagament  Ascriptin Doan's Midol Talwin  Ascriptin A/D Dolene Mobidin Tanderil  Ascriptin Extra Strength Dolobid  Moblgesic Ticlid  Ascriptin with Codeine Doloprin or Doloprin with Codeine Momentum Tolectin  Asperbuf Duoprin Mono-gesic Trendar  Aspergum Duradyne Motrin or Motrin IB Triminicin  Aspirin plain, buffered or enteric coated Durasal Myochrisine Trigesic  Aspirin Suppositories Easprin Nalfon Trillsate  Aspirin with Codeine Ecotrin Regular or Extra Strength Naprosyn Uracel  Atromid-S Efficin Naproxen Ursinus  Auranofin Capsules Elmiron Neocylate Vanquish  Axotal Emagrin Norgesic Verin  Azathioprine Empirin or Empirin with Codeine Normiflo Vitamin E  Azolid Emprazil Nuprin Voltaren  Bayer Aspirin plain, buffered or children's or timed BC Tablets or powders  Encaprin Orgaran Warfarin Sodium  Buff-a-Comp Enoxaparin Orudis Zorpin  Buff-a-Comp with Codeine Equegesic Os-Cal-Gesic   Buffaprin Excedrin plain, buffered or Extra Strength Oxalid   Bufferin Arthritis Strength Feldene Oxphenbutazone   Bufferin plain or Extra Strength Feldene Capsules Oxycodone with Aspirin   Bufferin with Codeine Fenoprofen Fenoprofen Pabalate or Pabalate-SF   Buffets II Flogesic Panagesic   Buffinol plain or Extra Strength Florinal or Florinal with Codeine Panwarfarin   Buf-Tabs Flurbiprofen Penicillamine   Butalbital Compound Four-way cold tablets Penicillin   Butazolidin Fragmin Pepto-Bismol   Carbenicillin Geminisyn Percodan   Carna Arthritis Reliever Geopen Persantine   Carprofen Gold's salt Persistin   Chloramphenicol Goody's Phenylbutazone   Chloromycetin Haltrain Piroxlcam   Clmetidine heparin Plaquenil   Cllnoril Hyco-pap Ponstel   Clofibrate Hydroxy chloroquine Propoxyphen         Before stopping any of these medications, be sure to consult the physician who ordered them.  Some, such as Coumadin (Warfarin) are ordered to prevent or treat serious conditions such as "deep thrombosis", "pumonary embolisms", and other heart problems.  The amount of time that you may need off of the medication may also vary with  the medication and the reason for which you were taking it.  If you are taking any of these medications, please make sure you notify your pain physician before you undergo any procedures.         Trigger Point Injection Trigger points are areas where you have muscle pain. A trigger point injection is a shot given in the trigger point to relieve that pain. A trigger point might feel like a knot in your muscle. It hurts to press on a trigger point. Sometimes the pain spreads out (radiates) to other parts of the body. For example, pressing on a trigger point in your shoulder might cause pain in your arm or neck. You might have one trigger point. Or, you might have more than one. People often have trigger points in their upper back and lower back. They also occur often in the neck and shoulders. Pain from a trigger point lasts for a long time. It can make it hard to keep moving. You might not be able to do the exercise or physical therapy that could help you deal with the pain. A trigger point injection may help. It does not work for everyone. But, it may relieve your pain for a few days or a few months. A trigger point injection does not cure long-lasting (chronic) pain. LET YOUR CAREGIVER KNOW ABOUT:  Any allergies (especially to latex, lidocaine, or steroids).  Blood-thinning medicines that you take. These drugs can lead to bleeding or bruising after an injection. They include:  Aspirin.  Ibuprofen.  Clopidogrel.  Warfarin.  Other medicines you take. This includes all vitamins, herbs, eyedrops, over-the-counter medicines, and creams.  Use of steroids.  Recent infections.  Past problems with numbing medicines.  Bleeding problems.  Surgeries you have had.  Other health problems. RISKS AND COMPLICATIONS A trigger point injection is a safe treatment. However, problems may develop, such as:  Minor side effects usually go away in 1 to 2 days. These may  include:  Soreness.  Bruising.  Stiffness.  More serious problems are rare. But, they may include:  Bleeding under the skin (hematoma).  Skin infection.  Breaking off of the needle under your skin.  Lung puncture.  The trigger point injection may not work for you. BEFORE THE PROCEDURE You may need to stop taking any medicine that thins  your blood. This is to prevent bleeding and bruising. Usually these medicines are stopped several days before the injection. No other preparation is needed. PROCEDURE  A trigger point injection can be given in your caregiver's office or in a clinic. Each injection takes 2 minutes or less.  Your caregiver will feel for trigger points. The caregiver may use a marker to circle the area for the injection.  The skin over the trigger point will be washed with a germ-killing (antiseptic) solution.  The caregiver pinches the spot for the injection.  Then, a very thin needle is used for the shot. You may feel pain or a twitching feeling when the needle enters the trigger point.  A numbing solution may be injected into the trigger point. Sometimes a drug to keep down swelling, redness, and warmth (inflammation) is also injected.  Your caregiver moves the needle around the trigger zone until the tightness and twitching goes away.  After the injection, your caregiver may put gentle pressure over the injection site.  Then it is covered with a bandage. AFTER THE PROCEDURE  You can go right home after the injection.  The bandage can be taken off after a few hours.  You may feel sore and stiff for 1 to 2 days.  Go back to your regular activities slowly. Your caregiver may ask you to stretch your muscles. Do not do anything that takes extra energy for a few days.  Follow your caregiver's instructions to manage and treat other pain.   This information is not intended to replace advice given to you by your health care provider. Make sure you discuss  any questions you have with your health care provider.   Document Released: 11/28/2011 Document Revised: 04/05/2013 Document Reviewed: 11/28/2011 Elsevier Interactive Patient Education 2016 [employer] Trigger Point Injection Trigger points are areas where you have muscle pain. A trigger point injection is a shot given in the trigger point to relieve that pain. A trigger point might feel like a knot in your muscle. It hurts to press on a trigger point. Sometimes the pain spreads out (radiates) to other parts of the body. For example, pressing on a trigger point in your shoulder might cause pain in your arm or neck. You might have one trigger point. Or, you might have more than one. People often have trigger points in their upper back and lower back. They also occur often in the neck and shoulders. Pain from a trigger point lasts for a long time. It can make it hard to keep moving. You might not be able to do the exercise or physical therapy that could help you deal with the pain. A trigger point injection may help. It does not work for everyone. But, it may relieve your pain for a few days or a few months. A trigger point injection does not cure long-lasting (chronic) pain. LET YOUR CAREGIVER KNOW ABOUT:  Any allergies (especially to latex, lidocaine, or steroids).  Blood-thinning medicines that you take. These drugs can lead to bleeding or bruising after an injection. They include:  Aspirin.  Ibuprofen.  Clopidogrel.  Warfarin.  Other medicines you take. This includes all vitamins, herbs, eyedrops, over-the-counter medicines, and creams.  Use of steroids.  Recent infections.  Past problems with numbing medicines.  Bleeding problems.  Surgeries you have had.  Other health problems. RISKS AND COMPLICATIONS A trigger point injection is a safe treatment. However, problems may develop, such as:  Minor side effects usually go away in 1  to 2 days. These may  include:  Soreness.  Bruising.  Stiffness.  More serious problems are rare. But, they may include:  Bleeding under the skin (hematoma).  Skin infection.  Breaking off of the needle under your skin.  Lung puncture.  The trigger point injection may not work for you. BEFORE THE PROCEDURE You may need to stop taking any medicine that thins your blood. This is to prevent bleeding and bruising. Usually these medicines are stopped several days before the injection. No other preparation is needed. PROCEDURE  A trigger point injection can be given in your caregiver's office or in a clinic. Each injection takes 2 minutes or less.  Your caregiver will feel for trigger points. The caregiver may use a marker to circle the area for the injection.  The skin over the trigger point will be washed with a germ-killing (antiseptic) solution.  The caregiver pinches the spot for the injection.  Then, a very thin needle is used for the shot. You may feel pain or a twitching feeling when the needle enters the trigger point.  A numbing solution may be injected into the trigger point. Sometimes a drug to keep down swelling, redness, and warmth (inflammation) is also injected.  Your caregiver moves the needle around the trigger zone until the tightness and twitching goes away.  After the injection, your caregiver may put gentle pressure over the injection site.  Then it is covered with a bandage. AFTER THE PROCEDURE  You can go right home after the injection.  The bandage can be taken off after a few hours.  You may feel sore and stiff for 1 to 2 days.  Go back to your regular activities slowly. Your caregiver may ask you to stretch your muscles. Do not do anything that takes extra energy for a few days.  Follow your caregiver's instructions to manage and treat other pain.   This information is not intended to replace advice given to you by your health care provider. Make sure you discuss  any questions you have with your health care provider.   Document Released: 11/28/2011 Document Revised: 04/05/2013 Document Reviewed: 11/28/2011 Elsevier Interactive Patient Education [employer]

## 2015-10-23 ENCOUNTER — Ambulatory Visit: Payer: Medicare Other | Attending: Pain Medicine | Admitting: Pain Medicine

## 2015-10-23 ENCOUNTER — Encounter: Payer: Self-pay | Admitting: Pain Medicine

## 2015-10-23 VITALS — BP 133/57 | HR 56 | Temp 98.4°F | Resp 18 | Ht 65.0 in | Wt 154.0 lb

## 2015-10-23 DIAGNOSIS — M7062 Trochanteric bursitis, left hip: Secondary | ICD-10-CM | POA: Insufficient documentation

## 2015-10-23 DIAGNOSIS — M5416 Radiculopathy, lumbar region: Secondary | ICD-10-CM

## 2015-10-23 DIAGNOSIS — E1340 Other specified diabetes mellitus with diabetic neuropathy, unspecified: Secondary | ICD-10-CM

## 2015-10-23 DIAGNOSIS — M7060 Trochanteric bursitis, unspecified hip: Secondary | ICD-10-CM

## 2015-10-23 DIAGNOSIS — M5136 Other intervertebral disc degeneration, lumbar region: Secondary | ICD-10-CM

## 2015-10-23 DIAGNOSIS — M533 Sacrococcygeal disorders, not elsewhere classified: Secondary | ICD-10-CM

## 2015-10-23 DIAGNOSIS — Z9889 Other specified postprocedural states: Secondary | ICD-10-CM

## 2015-10-23 DIAGNOSIS — M48062 Spinal stenosis, lumbar region with neurogenic claudication: Secondary | ICD-10-CM

## 2015-10-23 DIAGNOSIS — M961 Postlaminectomy syndrome, not elsewhere classified: Secondary | ICD-10-CM

## 2015-10-23 DIAGNOSIS — M47816 Spondylosis without myelopathy or radiculopathy, lumbar region: Secondary | ICD-10-CM

## 2015-10-23 MED ORDER — BUPIVACAINE HCL (PF) 0.25 % IJ SOLN: 30.0000 mL | Freq: Once | INTRAMUSCULAR | Status: DC

## 2015-10-23 MED ORDER — TRIAMCINOLONE ACETONIDE 40 MG/ML IJ SUSP
INTRAMUSCULAR | Status: AC
  Filled 2015-10-23: qty 1

## 2015-10-23 MED ORDER — TRIAMCINOLONE ACETONIDE 40 MG/ML IJ SUSP: 40.0000 mg | Freq: Once | INTRAMUSCULAR | Status: DC

## 2015-10-23 MED ORDER — ORPHENADRINE CITRATE 30 MG/ML IJ SOLN
INTRAMUSCULAR | Status: AC
  Filled 2015-10-23: qty 2

## 2015-10-23 MED ORDER — BUPIVACAINE HCL (PF) 0.25 % IJ SOLN
INTRAMUSCULAR | Status: AC
  Filled 2015-10-23: qty 30

## 2015-10-23 NOTE — Progress Notes (Signed)
   Subjective:    Patient ID: Yvette Collier, female    DOB: [birth year], 74 y.o.   MRN: [id number]  HPI                          GREATER TROCHANTERIC BURSA INJECTIONS    The patient is a 74 year old female who returns to [facility] for further evaluation and treatment of severe pain involving the lower back and lower extremity regions. The patient is a severe pain of the greater trochanteric region which interferes with patient's ability to obtain restful sleep as well as to perform activities of daily living. The patient denies any trauma or change in events of daily living the call significant change in symptomatology. The patient is with prior surgical intervention of the lumbar region without plans for additional surgery. There is concern regarding significant component of patient's pain being due to greater trochanteric bursitis. The risks benefits and expectations of procedure were discussed and explained to patient who was with understanding and in agreement with suggested treatment plan. We will proceed with greater trochanteric bursa injections felt to be medically necessary procedure to decrease severity of symptoms, prevent progression of symptoms, and avoid need for more involved treatment. The patient was in agreement with suggested treatment plan    DESCRIPTION OF PROCEDURE:   GREATER TROCHANTERIC BURSA INJECTION ON THE LEFT SIDE   With patient in lateral decubitus position with EKG blood pressure pulse and pulse oximetry monitoring all in place Betadine prep of proposed entry site was accomplished Following identification of landmarks for greater trochanteric bursa injection on the left side a 22-gauge needle was inserted to the greater trochanteric region on the left. A total of 5 cc of 0.25% bupivacaine with Kenalog was injected for left greater trochanteric bursa injection. The needle was removed. The patient tolerated the procedure well   DESCRIPTION OF PROCEDURE:  GREATER TROCHANTERIC BURSA INJECTION ON THE RIGHT SIDE    The procedure was performed on the right side exactly as was performed on the left side you utilizing the same technique  The patient tolerated procedure well  A total of 20 mg of Kenalog was utilized for the entire procedure     PLAN   Continue present medication  F/U PCP Dr. Lawerance BachBurns  for evaliation of  BP and general medical  condition  F/U surgical evaluation. May consider pending follow-up evaluations  F/U neurological evaluation. May consider pending follow-up evaluations  May consider radiofrequency rhizolysis or intraspinal procedures pending response to present treatment and F/U evaluation   Patient to call [facility] should patient have concerns prior to scheduled return appointment.       Review of Systems     Objective:   Physical Exam        Assessment & Plan:

## 2015-10-23 NOTE — Patient Instructions (Addendum)
PLAN   Continue present medication Lyrica and oxycodone   F/U PCP Dr. Lawerance BachBurns for evaliation of  BP and general medical  condition.  F/U surgical evaluation. Patient will follow-up with Dr.Cram   F/U neurological evaluation. May consider pending follow-up evaluations  May consider radiofrequency rhizolysis or intraspinal procedures pending response to present treatment and F/U evaluation.Pain Management Discharge Instructions  General Discharge Instructions :  If you need to reach your doctor call: Monday-Friday 8:00 am - 4:00 pm at [phone] or toll free [phone].  After clinic hours [phone] to have operator reach doctor.  Bring all of your medication bottles to all your appointments in the pain clinic.  To cancel or reschedule your appointment with Pain Management please remember to call 24 hours in advance to avoid a fee.  Refer to the educational materials which you have been given on: General Risks, I had my Procedure. Discharge Instructions, Post Sedation.  Post Procedure Instructions:  The drugs you were given will stay in your system until tomorrow, so for the next 24 hours you should not drive, make any legal decisions or drink any alcoholic beverages.  You may eat anything you prefer, but it is better to start with liquids then soups and crackers, and gradually work up to solid foods.  Please notify your doctor immediately if you have any unusual bleeding, trouble breathing or pain that is not related to your normal pain.  Depending on the type of procedure that was done, some parts of your body may feel week and/or numb.  This usually clears up by tonight or the next day.  Walk with the use of an assistive device or accompanied by an adult for the 24 hours.  You may use ice on the affected area for the first 24 hours.  Put ice in a Ziploc bag and cover with a towel and place against area 15 minutes on 15 minutes off.  You may switch to heat after 24 hours.

## 2015-10-23 NOTE — Progress Notes (Signed)
Safety precautions to be maintained throughout the outpatient stay will include: orient to surroundings, keep bed in low position, maintain call bell within reach at all times, provide assistance with transfer out of bed and ambulation.  

## 2015-10-24 ENCOUNTER — Telehealth: Payer: Self-pay | Admitting: *Deleted

## 2015-10-24 NOTE — Telephone Encounter (Signed)
Spoke with [name], she has some soreness at the site.  Used ice yesterday and is going to alternate heat and ice today.  Denies any fever or bleeding.  Explained that steroids will take 5-7 days to be effective.  Patient verbalizes u/o same.

## 2015-11-13 ENCOUNTER — Ambulatory Visit (INDEPENDENT_AMBULATORY_CARE_PROVIDER_SITE_OTHER): Payer: Medicare Other | Admitting: Gastroenterology

## 2015-11-13 ENCOUNTER — Encounter: Payer: Self-pay | Admitting: Gastroenterology

## 2015-11-13 ENCOUNTER — Other Ambulatory Visit: Payer: Self-pay

## 2015-11-13 VITALS — BP 141/91 | HR 67 | Temp 99.3°F | Ht 65.0 in | Wt 156.0 lb

## 2015-11-13 DIAGNOSIS — I2510 Atherosclerotic heart disease of native coronary artery without angina pectoris: Secondary | ICD-10-CM | POA: Diagnosis not present

## 2015-11-13 DIAGNOSIS — K922 Gastrointestinal hemorrhage, unspecified: Secondary | ICD-10-CM | POA: Insufficient documentation

## 2015-11-13 DIAGNOSIS — K589 Irritable bowel syndrome without diarrhea: Secondary | ICD-10-CM

## 2015-11-13 MED ORDER — RANITIDINE HCL 150 MG PO TABS: 150.0000 mg | ORAL_TABLET | Freq: Two times a day (BID) | ORAL | Status: DC

## 2015-11-13 MED ORDER — PROMETHAZINE HCL 12.5 MG PO TABS: 12.5000 mg | ORAL_TABLET | Freq: Four times a day (QID) | ORAL | Status: DC | PRN

## 2015-11-13 NOTE — Progress Notes (Signed)
Primary Care Physician: Hyman HopesBurns, Harriett P, MD  Primary Gastroenterologist:  Dr. Midge Miniumarren Mcclain Shall  Chief Complaint  Patient presents with  . Irritable Bowel Syndrome  . Abdominal Pain    HPI: Yvette Collier is a 74 y.o. female here follow-up for her irritable bowel syndrome. Patient reports that she has been having nausea and vomiting and has not been able to eat much. She states that multiple foods make her worse including spaghetti sauce and pizza. She states that her diarrhea is well controlled with Imodium and Questran. She also reports that she has run out of her Zantac which she states helped her greatly. Despite all these symptoms the patient's weight has not changed from a year ago and has always been within 1-2 pounds of where she is now. He is actually up a pound from January. She also states that stress is making her symptoms much worse and states that she is under a lot of stress.   Current Outpatient Prescriptions  Medication Sig Dispense Refill  . aspirin EC 81 MG tablet Take 81 mg by mouth daily.    . cetirizine (ZYRTEC) 10 MG tablet Take 10 mg by mouth daily.    Marland Kitchen. dexlansoprazole (DEXILANT) 60 MG capsule Take 60 mg by mouth daily.    Marland Kitchen. dicyclomine (BENTYL) 20 MG tablet Take 20 mg by mouth 2 (two) times daily.    Marland Kitchen. diltiazem (CARDIZEM CD) 120 MG 24 hr capsule Take 1 capsule (120 mg total) by mouth daily after breakfast. 90 capsule 3  . glipiZIDE (GLUCOTROL) 10 MG tablet Take 10 mg by mouth daily before breakfast.    . metoprolol succinate (TOPROL-XL) 50 MG 24 hr tablet Take 1 tablet (50 mg total) by mouth every evening. Take with or immediately following a meal. 90 tablet 3  . oxyCODONE (ROXICODONE) 5 MG immediate release tablet Limit 1 tab by mouth per day or 2-3 times per day if tolerated 90 tablet 0  . pregabalin (LYRICA) 25 MG capsule Limit 1 tablet by mouth per day if tolerated 30 capsule 2  . Probiotic Product (VSL#3 PO) Take by mouth.    . simvastatin (ZOCOR) 10 MG tablet  Take 10 mg by mouth every evening.     . cefUROXime (CEFTIN) 250 MG tablet     . cholestyramine light (PREVALITE) 4 G packet     . losartan (COZAAR) 25 MG tablet     . promethazine (PHENERGAN) 12.5 MG tablet Take 1 tablet (12.5 mg total) by mouth every 6 (six) hours as needed for nausea or vomiting. 30 tablet 2  . ranitidine (ZANTAC) 150 MG tablet Take 1 tablet (150 mg total) by mouth 2 (two) times daily. 60 tablet 3   Current Facility-Administered Medications  Medication Dose Route Frequency Provider Last Rate Last Dose  . bupivacaine (PF) (MARCAINE) 0.25 % injection 30 mL  30 mL Other Once Ewing SchleinGregory Crisp, MD      . triamcinolone acetonide [facility](KENALOG-40) injection 40 mg  40 mg Other Once Ewing SchleinGregory Crisp, MD        Allergies as of 11/13/2015 - Review Complete 11/13/2015  Allergen Reaction Noted  . Effexor [venlafaxine] Hives 07/14/2013  . Ivp dye [iodinated diagnostic agents] Hives 07/14/2013  . Lisinopril Swelling and Other (See Comments) 07/13/2015  . Sulfa antibiotics Hives 07/14/2013    ROS:  General: Negative for anorexia, weight loss, fever, chills, fatigue, weakness. ENT: Negative for hoarseness, difficulty swallowing , nasal congestion. CV: Negative for chest pain, angina, palpitations, dyspnea on exertion,  peripheral edema.  Respiratory: Negative for dyspnea at rest, dyspnea on exertion, cough, sputum, wheezing.  GI: See history of present illness. GU:  Negative for dysuria, hematuria, urinary incontinence, urinary frequency, nocturnal urination.  Endo: Negative for unusual weight change.    Physical Examination:   BP 141/91 mmHg  Pulse 67  Temp(Src) 99.3 F (37.4 C) (Oral)  Ht  (1.651 m)  Wt 156 lb (70.761 kg)  BMI 25.96 kg/m2  General: Well-nourished, well-developed in no acute distress.  Eyes: No icterus. Conjunctivae pink. Mouth: Oropharyngeal mucosa moist and pink , no lesions erythema or exudate. Lungs: Clear to auscultation bilaterally. Non-labored. Heart:  Regular rate and rhythm, no murmurs rubs or gallops.  Abdomen: Bowel sounds are normal, nontender, nondistended, no hepatosplenomegaly or masses, no abdominal bruits or hernia , no rebound or guarding.   Extremities: No lower extremity edema. No clubbing or deformities. Neuro: Alert and oriented x 3.  Grossly intact. Skin: Warm and dry, no jaundice.   Psych: Alert and cooperative, normal mood and affect.  Labs:    Imaging Studies: No results found.  Assessment and Plan:   Yvette Collier is a 75 y.o. y/o female with a history of irritable bowel syndrome who comes in today with a report of chronic nausea. The patient will be started on Phenergan 12.5 mg to be taken as needed every 6 hours. She will also have her Zantac refilled. Despite the patient reporting that she vomits all the time and is not able to take in any food because she is nauseous and has no appetite she has not lost any weight. Will let me know if the Phenergan and Zantac do not bring her symptoms back under control.   Note: This dictation was prepared with Dragon dictation along with smaller phrase technology. Any transcriptional errors that result from this process are unintentional.

## 2015-11-15 ENCOUNTER — Ambulatory Visit: Payer: Medicare Other | Admitting: Pain Medicine

## 2015-11-21 ENCOUNTER — Encounter: Payer: Self-pay | Admitting: Pain Medicine

## 2015-11-21 ENCOUNTER — Other Ambulatory Visit: Payer: Self-pay

## 2015-11-21 ENCOUNTER — Ambulatory Visit: Payer: Medicare Other | Attending: Pain Medicine | Admitting: Pain Medicine

## 2015-11-21 ENCOUNTER — Other Ambulatory Visit: Payer: Self-pay | Admitting: Internal Medicine

## 2015-11-21 VITALS — BP 130/71 | HR 56 | Temp 99.0°F | Resp 18 | Ht 65.0 in | Wt 152.0 lb

## 2015-11-21 DIAGNOSIS — Z13820 Encounter for screening for osteoporosis: Secondary | ICD-10-CM

## 2015-11-21 DIAGNOSIS — Z9889 Other specified postprocedural states: Secondary | ICD-10-CM | POA: Diagnosis not present

## 2015-11-21 DIAGNOSIS — K2100 Gastro-esophageal reflux disease with esophagitis, without bleeding: Secondary | ICD-10-CM

## 2015-11-21 DIAGNOSIS — M4806 Spinal stenosis, lumbar region: Secondary | ICD-10-CM | POA: Diagnosis not present

## 2015-11-21 DIAGNOSIS — M5136 Other intervertebral disc degeneration, lumbar region: Secondary | ICD-10-CM

## 2015-11-21 DIAGNOSIS — M48062 Spinal stenosis, lumbar region with neurogenic claudication: Secondary | ICD-10-CM

## 2015-11-21 DIAGNOSIS — M79604 Pain in right leg: Secondary | ICD-10-CM | POA: Diagnosis present

## 2015-11-21 DIAGNOSIS — M5416 Radiculopathy, lumbar region: Secondary | ICD-10-CM

## 2015-11-21 DIAGNOSIS — M961 Postlaminectomy syndrome, not elsewhere classified: Secondary | ICD-10-CM

## 2015-11-21 DIAGNOSIS — M5116 Intervertebral disc disorders with radiculopathy, lumbar region: Secondary | ICD-10-CM | POA: Diagnosis not present

## 2015-11-21 DIAGNOSIS — M533 Sacrococcygeal disorders, not elsewhere classified: Secondary | ICD-10-CM | POA: Diagnosis not present

## 2015-11-21 DIAGNOSIS — M7060 Trochanteric bursitis, unspecified hip: Secondary | ICD-10-CM | POA: Diagnosis not present

## 2015-11-21 DIAGNOSIS — M160 Bilateral primary osteoarthritis of hip: Secondary | ICD-10-CM | POA: Diagnosis not present

## 2015-11-21 DIAGNOSIS — M79605 Pain in left leg: Secondary | ICD-10-CM | POA: Diagnosis present

## 2015-11-21 DIAGNOSIS — M47816 Spondylosis without myelopathy or radiculopathy, lumbar region: Secondary | ICD-10-CM

## 2015-11-21 DIAGNOSIS — E1340 Other specified diabetes mellitus with diabetic neuropathy, unspecified: Secondary | ICD-10-CM

## 2015-11-21 DIAGNOSIS — M545 Low back pain: Secondary | ICD-10-CM | POA: Diagnosis not present

## 2015-11-21 DIAGNOSIS — Z1231 Encounter for screening mammogram for malignant neoplasm of breast: Secondary | ICD-10-CM

## 2015-11-21 MED ORDER — OXYCODONE HCL 5 MG PO TABS: ORAL_TABLET | ORAL | Status: DC

## 2015-11-21 MED ORDER — PREGABALIN 25 MG PO CAPS: ORAL_CAPSULE | ORAL | Status: DC

## 2015-11-21 MED ORDER — DEXLANSOPRAZOLE 60 MG PO CPDR: 60.0000 mg | DELAYED_RELEASE_CAPSULE | Freq: Every day | ORAL | Status: DC

## 2015-11-21 NOTE — Progress Notes (Signed)
   Subjective:    Patient ID: Yvette RakersMary L Collier, female    DOB: 01-18-1941, 74 y.o.   MRN: [id number]  HPI  Patient is 74 year old female who returns to pain management for further evaluation and treatment of pain involving the lower back and lower extremity region. The patient is with prior surgery of the lumbar region and is with known degenerative joint disease of the hips as well. At the present time patient states the pain is fairly well-controlled. We will continue Lyrica and oxycodone as prescribed this time and we'll remain available to consider modification of treatment pending patient's response to present treatment regimen. The patient agreed to suggested treatment plan. The patient denies any trauma change in events of daily living the call significant change in symptomatology. We will continue Lyrica and oxycodone as prescribed at this time.       Review of Systems     Objective:   Physical Exam  There was tenderness to palpation of the splenius capitis and occipitalis musculature regions. Palpation of these regions reproduced pain of mild degree. Palpation over the cervical facet cervical paraspinal musculature regions reproduced pain of mild degree. There was mild tenderness to palpation over the thoracic facet thoracic paraspinal musculature region. Tinel and Phalen's maneuver were without increased pain of significant degree. There was no crepitus of the thoracic region noted. Palpation over the lumbar paraspinal musculatures and lumbar facet region was attends to palpation of mild to moderate degree. Lateral bending rotation extension and palpation of the lumbar facets reproduce mild to moderate discomfort. There was mild to moderate tensed palpation of the PSIS and PII S regions. Palpation of the greater trochanteric region iliotibial band region reproduces minimal discomfort. The patient was a significant improvement of greater trochanteric bursitis following greater trochanteric  bursa injections. Straight leg raising was tolerates approximately 20.Marland Kitchen. There was no increased pain with dorsiflexion noted. There was tenderness to palpation of the PSIS and PSIS region on the left as well as on the right. There was negative clonus negative Homans. There was questionably decreased sensation of the L5 dermatomal speech detected. Abdomen was nontender with no costovertebral tenderness noted.      Assessment & Plan:     Greater trochanteric bursitis  Degenerative disc disease lumbar spine Status post surgery lumbar region with multilevel degenerative changes lumbar spine with L4-L5 level most involved. Facet arthropathy, foraminal stenosis  Lumbar radiculopathy  Lumbar facet syndrome  Sacroiliac joint dysfunction     PLAN  Continue present medication Lyrica and oxycodone   F/U PCP Yvette Collier for evaliation of  BP and general medical  condition. Please discuss Cozaar with Yvette Collier  F/U surgical evaluation. Patient will follow-up with [name]   F/U neurological evaluation. May consider pending follow-up evaluations  May consider radiofrequency rhizolysis or intraspinal procedures pending response to present treatment and F/U evaluation.  Patient to call Pain Management Center should patient have concerns prior to scheduled return appointment.

## 2015-11-21 NOTE — Progress Notes (Signed)
Safety precautions to be maintained throughout the outpatient stay will include: orient to surroundings, keep bed in low position, maintain call bell within reach at all times, provide assistance with transfer out of bed and ambulation.  

## 2015-11-21 NOTE — Patient Instructions (Addendum)
PLAN   Continue present medication Lyrica and oxycodone   F/U PCP Dr. Lawerance BachBurns for evaliation of  BP and general medical  condition. Please discuss Cozaar with Dr. Lawerance BachBurns  F/U surgical evaluation. Patient will follow-up with Dr.Cram   F/U neurological evaluation. May consider pending follow-up evaluations  May consider radiofrequency rhizolysis or intraspinal procedures pending response to present treatment and F/U evaluation.  Patient to call Pain Management Center should patient have concerns prior to scheduled return appointment.

## 2015-12-05 ENCOUNTER — Ambulatory Visit: Payer: Medicare Other | Attending: Internal Medicine

## 2015-12-05 ENCOUNTER — Other Ambulatory Visit: Payer: Medicare Other

## 2015-12-08 ENCOUNTER — Encounter: Payer: Self-pay | Admitting: *Deleted

## 2015-12-08 ENCOUNTER — Telehealth: Payer: Self-pay | Admitting: Pain Medicine

## 2015-12-08 ENCOUNTER — Emergency Department
Admission: EM | Admit: 2015-12-08 | Discharge: 2015-12-08 | Disposition: A | Discharge status: Home / Self Care | Payer: Medicare Other | Attending: Emergency Medicine | Admitting: Emergency Medicine

## 2015-12-08 DIAGNOSIS — Z87891 Personal history of nicotine dependence: Secondary | ICD-10-CM | POA: Diagnosis not present

## 2015-12-08 DIAGNOSIS — E119 Type 2 diabetes mellitus without complications: Secondary | ICD-10-CM | POA: Diagnosis not present

## 2015-12-08 DIAGNOSIS — M545 Low back pain: Secondary | ICD-10-CM | POA: Diagnosis not present

## 2015-12-08 DIAGNOSIS — M549 Dorsalgia, unspecified: Secondary | ICD-10-CM

## 2015-12-08 DIAGNOSIS — I10 Essential (primary) hypertension: Secondary | ICD-10-CM | POA: Insufficient documentation

## 2015-12-08 DIAGNOSIS — G8929 Other chronic pain: Secondary | ICD-10-CM | POA: Diagnosis not present

## 2015-12-08 MED ORDER — PREDNISONE 10 MG (21) PO TBPK: 10.0000 mg | ORAL_TABLET | Freq: Every day | ORAL | Status: DC

## 2015-12-08 MED ORDER — HYDROMORPHONE HCL 1 MG/ML IJ SOLN
0.5000 mg | Freq: Once | INTRAMUSCULAR | Status: DC
  Filled 2015-12-08: qty 1

## 2015-12-08 MED ORDER — DEXAMETHASONE SODIUM PHOSPHATE 10 MG/ML IJ SOLN
10.0000 mg | Freq: Once | INTRAMUSCULAR | Status: DC
  Filled 2015-12-08: qty 1

## 2015-12-08 MED ORDER — HYDROMORPHONE HCL 1 MG/ML IJ SOLN
0.5000 mg | Freq: Once | INTRAMUSCULAR | Status: AC
Start: 2015-12-08 — End: 2015-12-08
  Administered 2015-12-08: 0.5 mg via INTRAMUSCULAR

## 2015-12-08 MED ORDER — DEXAMETHASONE SODIUM PHOSPHATE 10 MG/ML IJ SOLN
10.0000 mg | Freq: Once | INTRAMUSCULAR | Status: AC
  Administered 2015-12-08: 10 mg via INTRAMUSCULAR

## 2015-12-08 MED ORDER — HYDROMORPHONE HCL 1 MG/ML IJ SOLN
0.5000 mg | Freq: Once | INTRAMUSCULAR | Status: AC
  Administered 2015-12-08: 0.5 mg via INTRAMUSCULAR

## 2015-12-08 NOTE — Telephone Encounter (Signed)
Spoke with patient, having a lot of pain in lower back and left leg. Dr. Metta Clinesrisp notified. May do LESI on Wednesday cleared medically with Dr. Lawerance BachBurns. Also to stop ASA 5 days. Patient notified.

## 2015-12-08 NOTE — ED Provider Notes (Signed)
[facility] Emergency Department Provider Note     Time seen: ----------------------------------------- 1:27 PM on 12/08/2015 -----------------------------------------    I have reviewed the triage vital signs and the nursing notes.   HISTORY  Chief Complaint Back Pain    HPI Yvette Collier is a 74 y.o. female who presents ER with chronic back pain.Patient was sent from the pain clinic doctor due to worsening pain. Patient is only taking 5 mg oxycodone twice a day for her chronic low back pain. Intermittently she's had radicular pain down the left leg which she states is an old problem. She denies any fevers chills or other complaints. Exercise symptoms better or worse.   Past Medical History  Diagnosis Date  . GERD (gastroesophageal reflux disease)   . Asthma   . Diabetes mellitus without complication (HCC)   . Heart murmur   . IBS (irritable bowel syndrome)   . History of colon polyps   . Hypertension   . Osteoarthritis   . Chronic lower back pain   . Ischemic colitis (HCC)   . Gastrointestinal bleed   . COPD (chronic obstructive pulmonary disease) (HCC)   . 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 CAD (coronary artery disease)   . PAF (paroxysmal atrial fibrillation) (HCC)     a. 09/2014  . Atrial fibrillation (HCC) atrial fib    Patient Active Problem List   Diagnosis Date Noted  . Hemorrhage of gastrointestinal tract 11/13/2015  . Lumbosacral facet joint syndrome 07/26/2015  . Angioedema 07/13/2015  . DM2 (diabetes mellitus, type 2) (HCC) 07/13/2015  . Status post lumbar laminectomy 06/19/2015  . Lumbar radiculopathy 06/19/2015  . DDD (degenerative disc disease), lumbar 05/11/2015  . Facet syndrome, lumbar 05/11/2015  . Lumbar post-laminectomy syndrome 05/11/2015  . Sacroiliac joint disease 05/11/2015  . 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 Neuropathy due to secondary  diabetes (HCC) 05/11/2015  . Essential hypertension 01/03/2015  . CAD (coronary artery disease)   . PAF (paroxysmal atrial fibrillation) (HCC) 10/20/2014  . Ischemic colitis (HCC) 10/20/2014  . Diabetes mellitus type 2 with complications (HCC) 10/20/2014  . COPD (chronic obstructive pulmonary disease) (HCC) 10/20/2014  . Hyperlipidemia 10/20/2014  . GERD (gastroesophageal reflux disease) 10/20/2014  . Internal hemorrhoid, bleeding 07/14/2013    Past Surgical History  Procedure Laterality Date  . Foot surgery Right   . Stomach surgery    . Abdominal hysterectomy    . Hemorrhoid banding    . Total hip arthroplasty    . Back surgery    . Cholecystectomy    . Appendectomy    . Tonsillectomy      Allergies Effexor; Ivp dye; Lisinopril; and Sulfa antibiotics  Social History Social History  Substance Use Topics  . Smoking status: Former Smoker -- 1.00 packs/day for 1 years  . Smokeless tobacco: Never Used  . Alcohol Use: No    Review of Systems Constitutional: Negative for fever. Eyes: Negative for visual changes. ENT: Negative for sore throat. Cardiovascular: Negative for chest pain. Respiratory: Negative for shortness of breath. Gastrointestinal: Negative for abdominal pain, vomiting and diarrhea. Genitourinary: Negative for dysuria. Musculoskeletal: Positive for back pain Skin: Negative for rash. Neurological: Negative for headaches, focal weakness or numbness.  10-point ROS otherwise negative.  ____________________________________________   PHYSICAL EXAM:  VITAL SIGNS: ED Triage Vitals  Enc Vitals Group     BP 12/08/15 1313 119/66 mmHg     Pulse Rate 12/08/15 1313 55  Resp 12/08/15 1313 20     Temp 12/08/15 1313 98 F (36.7 C)     Temp Source 12/08/15 1313 Oral     SpO2 12/08/15 1313 100 %     Weight 12/08/15 1313 152 lb (68.947 kg)     Height 12/08/15 1313 5\' 5"  (1.651 m)     Head Cir --      Peak Flow --      Pain Score 12/08/15 1313 9     Pain  Loc --      Pain Edu? --      Excl. in GC? --     Constitutional: Alert and oriented. Well appearing and in no distress. Eyes: Conjunctivae are normal. PERRL. Normal extraocular movements. ENT   Head: Normocephalic and atraumatic.   Nose: No congestion/rhinnorhea.   Mouth/Throat: Mucous membranes are moist.   Neck: No stridor. Cardiovascular: Normal rate, regular rhythm. Normal and symmetric distal pulses are present in all extremities. No murmurs, rubs, or gallops. Respiratory: Normal respiratory effort without tachypnea nor retractions. Breath sounds are clear and equal bilaterally. No wheezes/rales/rhonchi. Gastrointestinal: Soft and nontender. No distention. No abdominal bruits.  Musculoskeletal: Decreased range of motion of her lower extremities particularly. This seems to elicit pain in her low back. No edema is present. Patient reports to her sacrum as the area of tenderness. Neurologic:  Normal speech and language. No gross focal neurologic deficits are appreciated. Speech is normal. No gait instability. Skin:  Skin is warm, dry and intact. No rash noted. Psychiatric: Mood and affect are normal. Speech and behavior are normal. Patient exhibits appropriate insight and judgment.  ____________________________________________  ED COURSE:  Pertinent labs & imaging results that were available during my care of the patient were reviewed by me and considered in my medical decision making (see chart for details). Patient is in no acute distress, will give IV pain medicine and steroids. ____________________________________________  FINAL ASSESSMENT AND PLAN  Chronic back pain  Plan: Patient with labs and imaging as dictated above. I've advised her to increase oxycodone home only on an as-needed basis. She'll be on a steroid taper. She is advised to have strict hyperglycemia precautions.   Emily FilbertWilliams, Lynette Noah E, MD   Emily FilbertJonathan E Azalea Cedar, MD 12/08/15 [phone]

## 2015-12-08 NOTE — Discharge Instructions (Signed)

## 2015-12-08 NOTE — Telephone Encounter (Signed)
In a lot of pain since Thursday, what can be done ?

## 2015-12-08 NOTE — ED Notes (Signed)
Pt has chronic back pain being followed by the pain clinic, pt reports increased pain

## 2015-12-12 ENCOUNTER — Encounter: Payer: Self-pay | Admitting: Pain Medicine

## 2015-12-12 ENCOUNTER — Ambulatory Visit: Payer: Medicare Other | Attending: Pain Medicine | Admitting: Pain Medicine

## 2015-12-12 VITALS — BP 163/73 | HR 45 | Temp 98.3°F | Resp 18 | Ht 65.0 in | Wt 152.0 lb

## 2015-12-12 DIAGNOSIS — M5116 Intervertebral disc disorders with radiculopathy, lumbar region: Secondary | ICD-10-CM | POA: Insufficient documentation

## 2015-12-12 DIAGNOSIS — M48062 Spinal stenosis, lumbar region with neurogenic claudication: Secondary | ICD-10-CM

## 2015-12-12 DIAGNOSIS — M47816 Spondylosis without myelopathy or radiculopathy, lumbar region: Secondary | ICD-10-CM | POA: Diagnosis not present

## 2015-12-12 DIAGNOSIS — M5416 Radiculopathy, lumbar region: Secondary | ICD-10-CM

## 2015-12-12 DIAGNOSIS — E1340 Other specified diabetes mellitus with diabetic neuropathy, unspecified: Secondary | ICD-10-CM

## 2015-12-12 DIAGNOSIS — M533 Sacrococcygeal disorders, not elsewhere classified: Secondary | ICD-10-CM | POA: Insufficient documentation

## 2015-12-12 DIAGNOSIS — M7060 Trochanteric bursitis, unspecified hip: Secondary | ICD-10-CM

## 2015-12-12 DIAGNOSIS — M4806 Spinal stenosis, lumbar region: Secondary | ICD-10-CM | POA: Diagnosis not present

## 2015-12-12 DIAGNOSIS — M5136 Other intervertebral disc degeneration, lumbar region: Secondary | ICD-10-CM

## 2015-12-12 DIAGNOSIS — M961 Postlaminectomy syndrome, not elsewhere classified: Secondary | ICD-10-CM

## 2015-12-12 DIAGNOSIS — M545 Low back pain: Secondary | ICD-10-CM | POA: Diagnosis present

## 2015-12-12 DIAGNOSIS — Z9889 Other specified postprocedural states: Secondary | ICD-10-CM

## 2015-12-12 MED ORDER — OXYCODONE HCL 5 MG PO TABS: ORAL_TABLET | ORAL | Status: DC

## 2015-12-12 MED ORDER — PREGABALIN 25 MG PO CAPS: ORAL_CAPSULE | ORAL | Status: DC

## 2015-12-12 NOTE — Patient Instructions (Addendum)
c 

## 2015-12-12 NOTE — Progress Notes (Signed)
   Subjective:    Patient ID: Yvette RakersMary L Collier, female    DOB: 17-Oct-1941, 74 y.o.   MRN: [id number]  HPI  The patient is a 74 year old female who returns to pain management Center for further evaluation and treatment of pain involving the lower back and lower extremity region predominantly the patient is with recent exacerbation of lower back and lower extremity pain. The patient underwent evaluation in the emergency room and was prescribed a steroid Dosepak. We discussed patient's condition at the present time we will continue Lyrica and oxycodone. Patient has been cautioned regarding side effects of steroid Dosepak and will follow-up with Dr. Lawerance BachBurns in this regard as well as for follow-up evaluation and general medical condition. Patient will also follow up with Dr. Wynetta Emerycram for neurosurgical reevaluation of condition and we will remain available to consider modification of treatment pending further assessment of patient's condition and to response to the present medication regimen. The patient denied any trauma change in events of daily living the call symptomatology and stated that the pain just suddenly became more intense. We will continue present medications and will assess patient's response to the present treatment regimen and modify medication regimen as needed. The patient agreed to suggested treatment plan      Review of Systems     Objective:   Physical Exam  There was tenderness of the splenius capitis and occipitalis musculature regions of minimal degree. There was minimal tenderness to palpation of the acromioclavicular and glenohumeral joint region. Palpation of the cervical facet cervical paraspinal musculature region was with minimal tenderness to palpation. There was unremarkable Spurling's maneuver. Tinel and Phalen's maneuver were without increased pain of significant degree. Patient was with bilaterally equal grip strength. Palpation over the thoracic facet thoracic paraspinal  musculature region was attends to palpation of minimal to moderate degree palpation of the lower thoracic paraspinal muscular treat and lumbar facet region was attends to palpation of moderate to moderately severe degree. Lateral bending rotation extension and palpation of the lumbar facets reproduce moderate to moderately severe discomfort. There was tenderness to palpation of the PSIS and PII S region a moderate degree. Palpation of the gluteal and piriformis musculature region was with moderate tenderness to palpation on the left and mild tenderness to palpation to moderate tends to palpation on the right. There was tends to palpation over the region 3 trochanteric region iliotibial band region a moderate degree. Straight leg raise was tolerates approximately 20 without a definite increased pain with dorsiflexion noted. EHL strength appeared to be decreased. There was negative clonus negative Homans. Abdomen nontender with no costovertebral tenderness noted.       Assessment & Plan:    Degenerative disc disease lumbar spine Status post surgery lumbar region with multilevel degenerative changes lumbar spine with L4-L5 level most involved. Facet arthropathy, foraminal stenosis  Lumbar radiculopathy  Lumbar facet syndrome  Sacroiliac joint dysfunction   PLAN  Continue present medications Lyrica and oxycodone  F/U PCP Dr. Lawerance BachBurns for evaliation of  BP diabetes mellitus and general medical  condition  F/U surgical evaluation. Patient will follow-up with Dr. Wynetta Emeryram as discussed  F/U neurological evaluation. May consider pending follow-up evaluations  May consider radiofrequency rhizolysis or intraspinal procedures pending response to present treatment and F/U evaluation   Patient to call Pain Management Center should patient have concerns prior to scheduled return appointment.

## 2015-12-12 NOTE — Progress Notes (Signed)
Safety precautions to be maintained throughout the outpatient stay will include: orient to surroundings, keep bed in low position, maintain call bell within reach at all times, provide assistance with transfer out of bed and ambulation.  

## 2015-12-13 ENCOUNTER — Ambulatory Visit: Payer: Self-pay | Admitting: Gastroenterology

## 2015-12-19 ENCOUNTER — Encounter: Payer: Medicare Other | Admitting: Pain Medicine

## 2015-12-29 ENCOUNTER — Other Ambulatory Visit: Payer: Self-pay | Admitting: Pain Medicine

## 2016-01-03 ENCOUNTER — Ambulatory Visit (INDEPENDENT_AMBULATORY_CARE_PROVIDER_SITE_OTHER): Payer: Medicare Other | Admitting: Nurse Practitioner

## 2016-01-03 ENCOUNTER — Encounter: Payer: Self-pay | Admitting: Nurse Practitioner

## 2016-01-03 VITALS — BP 124/76 | HR 61 | Ht 65.0 in | Wt 163.4 lb

## 2016-01-03 DIAGNOSIS — I119 Hypertensive heart disease without heart failure: Secondary | ICD-10-CM | POA: Insufficient documentation

## 2016-01-03 DIAGNOSIS — I480 Paroxysmal atrial fibrillation: Secondary | ICD-10-CM | POA: Diagnosis not present

## 2016-01-03 NOTE — Progress Notes (Signed)
Office Visit    Patient Name: Yvette Collier Date of Encounter: 01/03/2016  Primary Care Provider:  Hyman Hopes, MD Primary Cardiologist:  Concha Se, MD   Chief Complaint    75 y/o female with a h/o PAF, HTN, chronic low back pain, ischemic colitis, and GIB who presents for f/u.  Past Medical History    Past Medical History  Diagnosis Date  . GERD (gastroesophageal reflux disease)   . Asthma   . Diabetes mellitus without complication (HCC)   . IBS (irritable bowel syndrome)   . History of colon polyps   . Hypertensive heart disease   . Osteoarthritis   . Chronic lower back pain   . Ischemic colitis (HCC)   . Gastrointestinal bleed   . COPD (chronic obstructive pulmonary disease) (HCC)   . 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 Non-obstructive Coronary Artery Disease     a. 05/2009 Cath [facility]): minimal nonobs dzs.  Marland Kitchen PAF (paroxysmal atrial fibrillation) (HCC)     a. 09/2014 during GI illness;  b. CHA2DS2VASc = 5 (not currently on OAC 2/2 h/o GIB/ischemic colitis); c. 09/2014 Echo: EF 60-65%, imparied relaxation, nl RV size/fxn.   Past Surgical History  Procedure Laterality Date  . Foot surgery Right   . Stomach surgery    . Abdominal hysterectomy    . Hemorrhoid banding    . Left total hip arthroplasty  2012  . Back surgery      x 3, last 2004.  Marland Kitchen Cholecystectomy    . Appendectomy    . Tonsillectomy      Allergies  Allergies  Allergen Reactions  . Effexor [Venlafaxine] Hives  . Ivp Dye [Iodinated Diagnostic Agents] Hives  . Lisinopril Swelling and Other (See Comments)    Pt states that this medication caused her tongue and mouth to swell.    . Sulfa Antibiotics Hives    History of Present Illness    75 y/o female with the above complex PMH.  She has a h/o PAF dating back to 09/2014, which occurred in the setting of GI illness.  She has been managed with oral dilt and bb.  She is not on oral anticoagulation 2/2 h/o GIB.  She  was last seen in clinic in 05/2015 at which time she c/o fatigue and her bb dosing was adjusted. She is now taking Toprol-XL 50 mg in the evening. With this switch, she has had improvement in her fatigue. She occasionally notes a brief palpitation or fluttering in her chest, usually just lasting a second or 2, but has not had any prolonged tachycardia or palpitations. She tries to remain active and walks her dog daily. She does not usually express dyspnea on exertion but might express dyspnea exertion if she moves at a significantly greater pace than she is accustomed to it. She denies chest pain, PND, orthopnea, dizziness, syncope, edema, or early satiety.  Home Medications    Prior to Admission medications   Medication Sig Start Date End Date Taking? Authorizing Provider  aspirin EC 81 MG tablet Take 81 mg by mouth daily.   Yes Historical Provider, MD  cefUROXime (CEFTIN) 250 MG tablet Take 250 mg by mouth 2 (two) times daily with a meal. Reported on 12/12/2015 10/04/15  Yes Historical Provider, MD  cetirizine (ZYRTEC) 10 MG tablet Take 10 mg by mouth daily.   Yes Historical Provider, MD  cholestyramine light (PREVALITE) 4 G packet Take 4 g by mouth 2 (  two) times daily. Reported on 12/12/2015 09/15/15  Yes Historical Provider, MD  dexlansoprazole (DEXILANT) 60 MG capsule Take 1 capsule (60 mg total) by mouth daily. 11/21/15  Yes Midge Minium, MD  dicyclomine (BENTYL) 20 MG tablet Take 20 mg by mouth 2 (two) times daily.   Yes Historical Provider, MD  diltiazem (CARDIZEM CD) 120 MG 24 hr capsule Take 1 capsule (120 mg total) by mouth daily after breakfast. 05/25/15  Yes Antonieta Iba, MD  glipiZIDE (GLUCOTROL) 10 MG tablet Take 10 mg by mouth daily before breakfast.   Yes Historical Provider, MD  losartan (COZAAR) 25 MG tablet Take 25 mg by mouth daily.  10/31/15  Yes Historical Provider, MD  metoprolol succinate (TOPROL-XL) 50 MG 24 hr tablet Take 1 tablet (50 mg total) by mouth every evening. Take  with or immediately following a meal. 05/25/15  Yes Antonieta Iba, MD  oxyCODONE (ROXICODONE) 5 MG immediate release tablet Limit 1 tab by mouth per day or 2-3 times per day if tolerated 12/12/15  Yes Ewing Schlein, MD  pregabalin (LYRICA) 25 MG capsule Limit 1 tablet by mouth per day if tolerated Patient taking differently: Take 25 mg by mouth daily. Limit 1 tablet by mouth per day if tolerated 12/12/15  Yes Ewing Schlein, MD  Probiotic Product (VSL#3 PO) Take 1 tablet by mouth daily.    Yes Historical Provider, MD  promethazine (PHENERGAN) 12.5 MG tablet Take 1 tablet (12.5 mg total) by mouth every 6 (six) hours as needed for nausea or vomiting. 11/13/15  Yes Midge Minium, MD  ranitidine (ZANTAC) 150 MG tablet Take 1 tablet (150 mg total) by mouth 2 (two) times daily. 11/13/15  Yes Midge Minium, MD  simvastatin (ZOCOR) 10 MG tablet Take 10 mg by mouth every evening.    Yes Historical Provider, MD    Review of Systems    As above, generally doing well. She occasionally notes a very brief palpitation but no prolonged tachycardia. She will express dyspnea exertion at higher levels of activity but generally does okay. She denies chest pain, PND, orthopnea, dizziness, syncope, edema, or early satiety.  All other systems reviewed and are otherwise negative except as noted above.  Physical Exam    VS:  Ht 5\' 5"  (1.651 m)  Wt 163 lb 6.4 oz (74.118 kg)  BMI 27.19 kg/m2 , BMI Body mass index is 27.19 kg/(m^2). GEN: Well nourished, well developed, in no acute distress. HEENT: normal. Neck: Supple, no JVD, carotid bruits, or masses. Cardiac: RRR, no rubs, or gallops. 2/6 systolic ejection murmur at the right sternal border. No clubbing, cyanosis, edema.  Radials/DP/PT 2+ and equal bilaterally.  Respiratory:  Respirations regular and unlabored, clear to auscultation bilaterally. GI: Soft, nontender, nondistended, BS + x 4. MS: no deformity or atrophy. Skin: warm and dry, no rash. Neuro:  Strength and  sensation are intact. Psych: Normal affect.  Accessory Clinical Findings    ECG - regular sinus rhythm, first-degree AV block, 61, left axis, delayed R-wave progression-no acute ST or T changes.  Assessment & Plan    1.  Paroxysmal atrial fibrillation: Patient appears to be doing well from this standpoint and remains on diltiazem and Toprol therapy. Since switching to Toprol-XL only, she has had marked improvement in her fatigue. She is in sinus rhythm today. Despite her CHA2DS2VASc of 5, she is on aspirin 81 mg daily only. She does have prior history of ischemic colitis with GI bleed in the 80s and then again in October  2015, which coincided with her diagnosis of A. Fib.  2. Hypertensive heart disease: Blood pressure is stable on calcitonin blocker, ARB, and beta blocker therapy.  3. Hyperlipidemia: This is followed closely by her primary care provider and she is on simvastatin therapy.  4. Type 2 diabetes mellitus: This is followed by primary care and she remains on glipizide therapy.   5. Disposition: Follow-up with Dr. Mariah MillingGollan in 6 months or sooner if necessary.  Nicolasa Duckinghristopher Zian Delair, NP 01/03/2016, 3:29 PM

## 2016-01-03 NOTE — Patient Instructions (Signed)
Medication Instructions:  Your physician recommends that you continue on your current medications as directed. Please refer to the Current Medication list given to you today.   Labwork: none  Testing/Procedures: none  Follow-Up: Your physician wants you to follow-up in: 6 months with Dr. Mariah MillingGollan. You will receive a reminder letter in the mail two months in advance. If you don't receive a letter, please call our office to schedule the follow-up appointment.   If you need a refill on your cardiac medications before your next appointment, please call your pharmacy.

## 2016-01-05 ENCOUNTER — Ambulatory Visit: Payer: Medicare Other | Admitting: Nurse Practitioner

## 2016-01-09 ENCOUNTER — Encounter: Payer: Self-pay | Admitting: Pain Medicine

## 2016-01-09 ENCOUNTER — Ambulatory Visit: Payer: Medicare Other | Attending: Pain Medicine | Admitting: Pain Medicine

## 2016-01-09 VITALS — BP 137/75 | HR 53 | Temp 98.5°F | Resp 15 | Ht 65.0 in | Wt 162.0 lb

## 2016-01-09 DIAGNOSIS — M47816 Spondylosis without myelopathy or radiculopathy, lumbar region: Secondary | ICD-10-CM

## 2016-01-09 DIAGNOSIS — M79606 Pain in leg, unspecified: Secondary | ICD-10-CM | POA: Diagnosis present

## 2016-01-09 DIAGNOSIS — M4806 Spinal stenosis, lumbar region: Secondary | ICD-10-CM | POA: Diagnosis not present

## 2016-01-09 DIAGNOSIS — E1340 Other specified diabetes mellitus with diabetic neuropathy, unspecified: Secondary | ICD-10-CM

## 2016-01-09 DIAGNOSIS — M7060 Trochanteric bursitis, unspecified hip: Secondary | ICD-10-CM

## 2016-01-09 DIAGNOSIS — M5116 Intervertebral disc disorders with radiculopathy, lumbar region: Secondary | ICD-10-CM | POA: Diagnosis not present

## 2016-01-09 DIAGNOSIS — M5136 Other intervertebral disc degeneration, lumbar region: Secondary | ICD-10-CM

## 2016-01-09 DIAGNOSIS — M5416 Radiculopathy, lumbar region: Secondary | ICD-10-CM

## 2016-01-09 DIAGNOSIS — M48062 Spinal stenosis, lumbar region with neurogenic claudication: Secondary | ICD-10-CM

## 2016-01-09 DIAGNOSIS — M533 Sacrococcygeal disorders, not elsewhere classified: Secondary | ICD-10-CM | POA: Diagnosis not present

## 2016-01-09 DIAGNOSIS — M961 Postlaminectomy syndrome, not elsewhere classified: Secondary | ICD-10-CM

## 2016-01-09 DIAGNOSIS — M160 Bilateral primary osteoarthritis of hip: Secondary | ICD-10-CM | POA: Diagnosis not present

## 2016-01-09 DIAGNOSIS — M47896 Other spondylosis, lumbar region: Secondary | ICD-10-CM | POA: Insufficient documentation

## 2016-01-09 DIAGNOSIS — Z9889 Other specified postprocedural states: Secondary | ICD-10-CM | POA: Insufficient documentation

## 2016-01-09 DIAGNOSIS — M545 Low back pain: Secondary | ICD-10-CM | POA: Diagnosis present

## 2016-01-09 MED ORDER — PREGABALIN 25 MG PO CAPS: ORAL_CAPSULE | ORAL | Status: DC

## 2016-01-09 MED ORDER — OXYCODONE HCL 5 MG PO TABS: ORAL_TABLET | ORAL | Status: DC

## 2016-01-09 NOTE — Progress Notes (Signed)
   Subjective:    Patient ID: Yvette Collier, female    DOB: 07-23-1941, 75 y.o.   MRN: [id number]  HPI The patient is a 75 year old female who returns to pain management for further evaluation and treatment of pain involving the lower back and lower extremity region. The patient is status post surgical intervention of the lumbar region and is with known degenerative joint disease of the hips. There is concern regarding intraspinal abnormalities continue patient's symptomatology with component of pain being due to sacroiliac joint dysfunction to significant degree as well. The patient denies any recent trauma change in events of daily living the call significant change in symptomatology and states that she has had return of significant lower back pain. We discussed patient's condition and will proceed with block of nerves to the sacroiliac joint at time return appointment and continue medications consisting of Lyrica and oxycodone. The patient is with understanding and agreed to suggested treatment plan. We'll also address surgical reevaluation which patient prefers to avoid at this time.   Review of Systems     Objective:   Physical Exam  There was tenderness of the splenius capitis and occipitalis musculature region a mild degree there was minimal tense to palpation of the cervical facet cervical paraspinal musculature region as well as the thoracic facet thoracic paraspinal musculature region. There was minimal tenderness to palpation of the acromioclavicular and glenohumeral joint region and patient appeared to be with bilaterally equal grip strength with Tinel and Phalen's maneuver reproducing minimal discomfort. There was unremarkable Spurling's maneuver  Palpation over the region of the lumbar paraspinal must reason lumbar facet region was attends to palpation with tends to palpation over the region of the PSIS and PII S regions of moderate to moderately severe degree. There was mild to moderate  tends to palpation greater trochanteric region iliotibial band region. Straight leg raise was tolerated to approximately 20 without a definite increased pain with dorsiflexion noted. There was question decreased sensation of the lower extremities in a stocking-type distribution. There was negative clonus negative Homans. Abdomen was nontender with no costovertebral tenderness noted      Assessment & Plan:    Degenerative disc disease lumbar spine Status post surgery lumbar region with multilevel degenerative changes lumbar spine with L4-L5 level most involved. Facet arthropathy, foraminal stenosis  Lumbar radiculopathy  Lumbar facet syndrome  Sacroiliac joint dysfunction     PLAN   Continue present medication Lyrica and oxycodone   Block of nerves to the sacroiliac joint to be performed at time of return appointment  F/U PCP Dr. Lawerance Bach for evaliation of  BP and general medical  condition.   F/U surgical evaluation. Patient will follow-up with Dr.Cram   F/U neurological evaluation. May consider pending follow-up evaluations  May consider radiofrequency rhizolysis or intraspinal procedures pending response to present treatment and F/U evaluation.  Patient to call [facility] should patient have concerns prior to scheduled return appointment

## 2016-01-09 NOTE — Patient Instructions (Addendum)
PLAN   Continue present medication Lyrica and oxycodone   Block of nerves to the sacroiliac joint to be performed at time of return appointment  F/U PCP Dr. Lawerance Bach for evaliation of  BP and general medical  condition.   F/U surgical evaluation. Patient will follow-up with Dr.Cram   F/U neurological evaluation. May consider pending follow-up evaluations  May consider radiofrequency rhizolysis or intraspinal procedures pending response to present treatment and F/U evaluation.  Patient to call [facility] should patient have concerns prior to scheduled return appointment.Sacroiliac (SI) Joint Injection Patient Information  Description: The sacroiliac joint connects the scrum (very low back and tailbone) to the ilium (a pelvic bone which also forms half of the hip joint).  Normally this joint experiences very little motion.  When this joint becomes inflamed or unstable low back and or hip and pelvis pain may result.  Injection of this joint with local anesthetics (numbing medicines) and steroids can provide diagnostic information and reduce pain.  This injection is performed with the aid of x-ray guidance into the tailbone area while you are lying on your stomach.   You may experience an electrical sensation down the leg while this is being done.  You may also experience numbness.  We also may ask if we are reproducing your normal pain during the injection.  Conditions which may be treated SI injection:   Low back, buttock, hip or leg pain  Preparation for the Injection:  1. Do not eat any solid food or dairy products within 6 hours of your appointment.  2. You may drink clear liquids up to 2 hours before appointment.  Clear liquids include water, black coffee, juice or soda.  No milk or cream please. 3. You may take your regular medications, including pain medications with a sip of water before your appointment.  Diabetics should hold regular insulin (if take separately) and take  1/2 normal NPH dose the morning of the procedure.  Carry some sugar containing items with you to your appointment. 4. A driver must accompany you and be prepared to drive you home after your procedure. 5. Bring all of your current medications with you. 6. An IV may be inserted and sedation may be given at the discretion of the physician. 7. A blood pressure cuff, EKG and other monitors will often be applied during the procedure.  Some patients may need to have extra oxygen administered for a short period.  8. You will be asked to provide medical information, including your allergies, prior to the procedure.  We must know immediately if you are taking blood thinners (like Coumadin/Warfarin) or if you are allergic to IV iodine contrast (dye).  We must know if you could possible be pregnant.  Possible side effects:   Bleeding from needle site  Infection (rare, may require surgery)  Nerve injury (rare)  Numbness & tingling (temporary)  A brief convulsion or seizure  Light-headedness (temporary)  Pain at injection site (several days)  Decreased blood pressure (temporary)  Weakness in the leg (temporary)   Call if you experience:   New onset weakness or numbness of an extremity below the injection site that last more than 8 hours.  Hives or difficulty breathing ( go to the emergency room)  Inflammation or drainage at the injection site  Any new symptoms which are concerning to you  Please note:  Although the local anesthetic injected can often make your back/ hip/ buttock/ leg feel good for several hours after the injections,  the pain will likely return.  It takes 3-7 days for steroids to work in the sacroiliac area.  You may not notice any pain relief for at least that one week.  If effective, we will often do a series of three injections spaced 3-6 weeks apart to maximally decrease your pain.  After the initial series, we generally will wait some months before a repeat  injection of the same type.  If you have any questions, please call [phone] [facility]  GENERAL RISKS AND COMPLICATIONS  What are the risk, side effects and possible complications? Generally speaking, most procedures are safe.  However, with any procedure there are risks, side effects, and the possibility of complications.  The risks and complications are dependent upon the sites that are lesioned, or the type of nerve block to be performed.  The closer the procedure is to the spine, the more serious the risks are.  Great care is taken when placing the radio frequency needles, block needles or lesioning probes, but sometimes complications can occur. 1. Infection: Any time there is an injection through the skin, there is a risk of infection.  This is why sterile conditions are used for these blocks.  There are four possible types of infection. 1. Localized skin infection. 2. Central Nervous System Infection-This can be in the form of Meningitis, which can be deadly. 3. Epidural Infections-This can be in the form of an epidural abscess, which can cause pressure inside of the spine, causing compression of the spinal cord with subsequent paralysis. This would require an emergency surgery to decompress, and there are no guarantees that the patient would recover from the paralysis. 4. Discitis-This is an infection of the intervertebral discs.  It occurs in about 1% of discography procedures.  It is difficult to treat and it may lead to surgery.        2. Pain: the needles have to go through skin and soft tissues, will cause soreness.       3. Damage to internal structures:  The nerves to be lesioned may be near blood vessels or    other nerves which can be potentially damaged.       4. Bleeding: Bleeding is more common if the patient is taking blood thinners such as  aspirin, Coumadin, Ticiid, Plavix, etc., or if he/she have some genetic predisposition  such as  hemophilia. Bleeding into the spinal canal can cause compression of the spinal  cord with subsequent paralysis.  This would require an emergency surgery to  decompress and there are no guarantees that the patient would recover from the  paralysis.       5. Pneumothorax:  Puncturing of a lung is a possibility, every time a needle is introduced in  the area of the chest or upper back.  Pneumothorax refers to free air around the  collapsed lung(s), inside of the thoracic cavity (chest cavity).  Another two possible  complications related to a similar event would include: Hemothorax and Chylothorax.   These are variations of the Pneumothorax, where instead of air around the collapsed  lung(s), you may have blood or chyle, respectively.       6. Spinal headaches: They may occur with any procedures in the area of the spine.       7. Persistent CSF (Cerebro-Spinal Fluid) leakage: This is a rare problem, but may occur  with prolonged intrathecal or epidural catheters either due to the formation of a fistulous  track or a dural tear.       8. Nerve damage: By working so close to the spinal cord, there is always a possibility of  nerve damage, which could be as serious as a permanent spinal cord injury with  paralysis.       9. Death:  Although rare, severe deadly allergic reactions known as "Anaphylactic  reaction" can occur to any of the medications used.      10. Worsening of the symptoms:  We can always make thing worse.  What are the chances of something like this happening? Chances of any of this occuring are extremely low.  By statistics, you have more of a chance of getting killed in a motor vehicle accident: while driving to the hospital than any of the above occurring .  Nevertheless, you should be aware that they are possibilities.  In general, it is similar to taking a shower.  Everybody knows that you can slip, hit your head and get killed.  Does that mean that you should not shower again?  Nevertheless  always keep in mind that statistics do not mean anything if you happen to be on the wrong side of them.  Even if a procedure has a 1 (one) in a 1,000,000 (million) chance of going wrong, it you happen to be that one..Also, keep in mind that by statistics, you have more of a chance of having something go wrong when taking medications.  Who should not have this procedure? If you are on a blood thinning medication (e.g. Coumadin, Plavix, see list of "Blood Thinners"), or if you have an active infection going on, you should not have the procedure.  If you are taking any blood thinners, please inform your physician.  How should I prepare for this procedure?  Do not eat or drink anything at least six hours prior to the procedure.  Bring a driver with you .  It cannot be a taxi.  Come accompanied by an adult that can drive you back, and that is strong enough to help you if your legs get weak or numb from the local anesthetic.  Take all of your medicines the morning of the procedure with just enough water to swallow them.  If you have diabetes, make sure that you are scheduled to have your procedure done first thing in the morning, whenever possible.  If you have diabetes, take only half of your insulin dose and notify our nurse that you have done so as soon as you arrive at the clinic.  If you are diabetic, but only take blood sugar pills (oral hypoglycemic), then do not take them on the morning of your procedure.  You may take them after you have had the procedure.  Do not take aspirin or any aspirin-containing medications, at least eleven (11) days prior to the procedure.  They may prolong bleeding.  Wear loose fitting clothing that may be easy to take off and that you would not mind if it got stained with Betadine or blood.  Do not wear any jewelry or perfume  Remove any nail coloring.  It will interfere with some of our monitoring equipment.  NOTE: Remember that this is not meant to be  interpreted as a complete list of all possible complications.  Unforeseen problems may occur.  BLOOD THINNERS The following drugs contain aspirin or other products, which can cause increased bleeding during surgery and should not be taken for 2 weeks prior to and 1 week after surgery.  If you should need take something for relief of minor pain, you may take acetaminophen which is found in Tylenol,m Datril, Anacin-3 and Panadol. It is not blood thinner. The products listed below are.  Do not take any of the products listed below in addition to any listed on your instruction sheet.  A.P.C or A.P.C with Codeine Codeine Phosphate Capsules #3 Ibuprofen Ridaura  ABC compound Congesprin Imuran rimadil  Advil Cope Indocin Robaxisal  Alka-Seltzer Effervescent Pain Reliever and Antacid Coricidin or Coricidin-D  Indomethacin Rufen  Alka-Seltzer plus Cold Medicine Cosprin Ketoprofen S-A-C Tablets  Anacin Analgesic Tablets or Capsules Coumadin Korlgesic Salflex  Anacin Extra Strength Analgesic tablets or capsules CP-2 Tablets Lanoril Salicylate  Anaprox Cuprimine Capsules Levenox Salocol  Anexsia-D Dalteparin Magan Salsalate  Anodynos Darvon compound Magnesium Salicylate Sine-off  Ansaid Dasin Capsules Magsal Sodium Salicylate  Anturane Depen Capsules Marnal Soma  APF Arthritis pain formula Dewitt's Pills Measurin Stanback  Argesic Dia-Gesic Meclofenamic Sulfinpyrazone  Arthritis Bayer Timed Release Aspirin Diclofenac Meclomen Sulindac  Arthritis pain formula Anacin Dicumarol Medipren Supac  Analgesic (Safety coated) Arthralgen Diffunasal Mefanamic Suprofen  Arthritis Strength Bufferin Dihydrocodeine Mepro Compound Suprol  Arthropan liquid Dopirydamole Methcarbomol with Aspirin Synalgos  ASA tablets/Enseals Disalcid Micrainin Tagament  Ascriptin Doan's Midol Talwin  Ascriptin A/D Dolene Mobidin Tanderil  Ascriptin Extra Strength Dolobid Moblgesic Ticlid  Ascriptin with Codeine Doloprin or Doloprin  with Codeine Momentum Tolectin  Asperbuf Duoprin Mono-gesic Trendar  Aspergum Duradyne Motrin or Motrin IB Triminicin  Aspirin plain, buffered or enteric coated Durasal Myochrisine Trigesic  Aspirin Suppositories Easprin Nalfon Trillsate  Aspirin with Codeine Ecotrin Regular or Extra Strength Naprosyn Uracel  Atromid-S Efficin Naproxen Ursinus  Auranofin Capsules Elmiron Neocylate Vanquish  Axotal Emagrin Norgesic Verin  Azathioprine Empirin or Empirin with Codeine Normiflo Vitamin E  Azolid Emprazil Nuprin Voltaren  Bayer Aspirin plain, buffered or children's or timed BC Tablets or powders Encaprin Orgaran Warfarin Sodium  Buff-a-Comp Enoxaparin Orudis Zorpin  Buff-a-Comp with Codeine Equegesic Os-Cal-Gesic   Buffaprin Excedrin plain, buffered or Extra Strength Oxalid   Bufferin Arthritis Strength Feldene Oxphenbutazone   Bufferin plain or Extra Strength Feldene Capsules Oxycodone with Aspirin   Bufferin with Codeine Fenoprofen Fenoprofen Pabalate or Pabalate-SF   Buffets II Flogesic Panagesic   Buffinol plain or Extra Strength Florinal or Florinal with Codeine Panwarfarin   Buf-Tabs Flurbiprofen Penicillamine   Butalbital Compound Four-way cold tablets Penicillin   Butazolidin Fragmin Pepto-Bismol   Carbenicillin Geminisyn Percodan   Carna Arthritis Reliever Geopen Persantine   Carprofen Gold's salt Persistin   Chloramphenicol Goody's Phenylbutazone   Chloromycetin Haltrain Piroxlcam   Clmetidine heparin Plaquenil   Cllnoril Hyco-pap Ponstel   Clofibrate Hydroxy chloroquine Propoxyphen         Before stopping any of these medications, be sure to consult the physician who ordered them.  Some, such as Coumadin (Warfarin) are ordered to prevent or treat serious conditions such as "deep thrombosis", "pumonary embolisms", and other heart problems.  The amount of time that you may need off of the medication may also vary with the medication and the reason for which you were taking it.   If you are taking any of these medications, please make sure you notify your pain physician before you undergo any procedures.

## 2016-01-09 NOTE — Progress Notes (Signed)
Safety precautions to be maintained throughout the outpatient stay will include: orient to surroundings, keep bed in low position, maintain call bell within reach at all times, provide assistance with transfer out of bed and ambulation.  

## 2016-01-15 ENCOUNTER — Encounter: Payer: Self-pay | Admitting: Pain Medicine

## 2016-01-15 ENCOUNTER — Ambulatory Visit: Payer: Medicare Other | Attending: Pain Medicine | Admitting: Pain Medicine

## 2016-01-15 VITALS — BP 124/53 | HR 45 | Temp 99.5°F | Resp 16 | Ht 65.0 in | Wt 162.0 lb

## 2016-01-15 DIAGNOSIS — M533 Sacrococcygeal disorders, not elsewhere classified: Secondary | ICD-10-CM

## 2016-01-15 DIAGNOSIS — M48062 Spinal stenosis, lumbar region with neurogenic claudication: Secondary | ICD-10-CM

## 2016-01-15 DIAGNOSIS — M961 Postlaminectomy syndrome, not elsewhere classified: Secondary | ICD-10-CM

## 2016-01-15 DIAGNOSIS — M1288 Other specific arthropathies, not elsewhere classified, other specified site: Secondary | ICD-10-CM | POA: Diagnosis not present

## 2016-01-15 DIAGNOSIS — M5136 Other intervertebral disc degeneration, lumbar region: Secondary | ICD-10-CM

## 2016-01-15 DIAGNOSIS — M5416 Radiculopathy, lumbar region: Secondary | ICD-10-CM

## 2016-01-15 DIAGNOSIS — M545 Low back pain: Secondary | ICD-10-CM | POA: Diagnosis present

## 2016-01-15 DIAGNOSIS — Z9889 Other specified postprocedural states: Secondary | ICD-10-CM | POA: Insufficient documentation

## 2016-01-15 DIAGNOSIS — M7060 Trochanteric bursitis, unspecified hip: Secondary | ICD-10-CM

## 2016-01-15 DIAGNOSIS — M47816 Spondylosis without myelopathy or radiculopathy, lumbar region: Secondary | ICD-10-CM | POA: Insufficient documentation

## 2016-01-15 DIAGNOSIS — M79606 Pain in leg, unspecified: Secondary | ICD-10-CM | POA: Diagnosis present

## 2016-01-15 DIAGNOSIS — E1340 Other specified diabetes mellitus with diabetic neuropathy, unspecified: Secondary | ICD-10-CM

## 2016-01-15 MED ORDER — MIDAZOLAM HCL 5 MG/5ML IJ SOLN
INTRAMUSCULAR | Status: AC
  Administered 2016-01-15: 3 mg via INTRAVENOUS
  Filled 2016-01-15: qty 5

## 2016-01-15 MED ORDER — BUPIVACAINE HCL (PF) 0.25 % IJ SOLN: 30.0000 mL | Freq: Once | INTRAMUSCULAR | Status: DC

## 2016-01-15 MED ORDER — ORPHENADRINE CITRATE 30 MG/ML IJ SOLN: 60.0000 mg | Freq: Once | INTRAMUSCULAR | Status: DC

## 2016-01-15 MED ORDER — CEFUROXIME AXETIL 250 MG PO TABS: 250.0000 mg | ORAL_TABLET | Freq: Two times a day (BID) | ORAL | Status: DC

## 2016-01-15 MED ORDER — CEFAZOLIN SODIUM 1-5 GM-% IV SOLN: 1.0000 g | Freq: Once | INTRAVENOUS | Status: DC

## 2016-01-15 MED ORDER — BUPIVACAINE HCL (PF) 0.25 % IJ SOLN
INTRAMUSCULAR | Status: AC
  Filled 2016-01-15: qty 30

## 2016-01-15 MED ORDER — MIDAZOLAM HCL 5 MG/5ML IJ SOLN: 5.0000 mg | Freq: Once | INTRAMUSCULAR | Status: DC

## 2016-01-15 MED ORDER — FENTANYL CITRATE (PF) 100 MCG/2ML IJ SOLN: 100.0000 ug | Freq: Once | INTRAMUSCULAR | Status: DC

## 2016-01-15 MED ORDER — ORPHENADRINE CITRATE 30 MG/ML IJ SOLN
INTRAMUSCULAR | Status: AC
  Administered 2016-01-15: 10:00:00
  Filled 2016-01-15: qty 2

## 2016-01-15 MED ORDER — TRIAMCINOLONE ACETONIDE 40 MG/ML IJ SUSP
INTRAMUSCULAR | Status: AC
  Administered 2016-01-15: 10:00:00
  Filled 2016-01-15: qty 1

## 2016-01-15 MED ORDER — TRIAMCINOLONE ACETONIDE 40 MG/ML IJ SUSP: 40.0000 mg | Freq: Once | INTRAMUSCULAR | Status: DC

## 2016-01-15 MED ORDER — FENTANYL CITRATE (PF) 100 MCG/2ML IJ SOLN
INTRAMUSCULAR | Status: AC
  Administered 2016-01-15: 100 ug via INTRAVENOUS
  Filled 2016-01-15: qty 2

## 2016-01-15 MED ORDER — CEFAZOLIN SODIUM 1 G IJ SOLR
INTRAMUSCULAR | Status: AC
  Administered 2016-01-15: 1 g via INTRAVENOUS
  Filled 2016-01-15: qty 10

## 2016-01-15 NOTE — Patient Instructions (Addendum)
PLAN  Continue present medication Lyrica and oxycodone and begin taking antibiotic Ceftin as prescribed. Please obtain your antibiotic today and begin taking antibiotic today  F/U PCP Dr. Lawerance Bach for evaliation of  BP and general medical  condition.  F/U surgical evaluation. Patient will follow-up with Dr.Cram   F/U neurological evaluation. May consider pending follow-up evaluations  May consider radiofrequency rhizolysis or intraspinal procedures pending response to present treatment and F/U evaluation.  Patient to call Pain Management Center should patient have concerns prior to scheduled return appointment. Selective Nerve Root Block Patient Information  Description: Specific nerve roots exit the spinal canal and these nerves can be compressed and inflamed by a bulging disc and bone spurs.  By injecting steroids on the nerve root, we can potentially decrease the inflammation surrounding these nerves, which often leads to decreased pain.  Also, by injecting local anesthesia on the nerve root, this can provide [place] helpful information to give to your referring doctor if it decreases your pain.  Selective nerve root blocks can be done along the spine from the neck to the low back depending on the location of your pain.   After numbing the skin with local anesthesia, a small needle is passed to the nerve root and the position of the needle is verified using x-ray pictures.  After the needle is in correct position, we then deposit the medication.  You may experience a pressure sensation while this is being done.  The entire block usually lasts less than 15 minutes.  Conditions that may be treated with selective nerve root blocks:  Low back and leg pain  Spinal stenosis  Diagnostic block prior to potential surgery  Neck and arm pain  Post laminectomy syndrome  Preparation for the injection:  1. Do not eat any solid food or dairy products within 6 hours of your appointment. 2. You may drink  clear liquids up to 2 hours before an appointment.  Clear liquids include water, black coffee, juice or soda.  No milk or cream please. 3. You may take your regular medications, including pain medications, with a sip of water before your appointment.  Diabetics should hold regular insulin (if taken separately) and take 1/2 normal NPH dose the morning of the procedure.  Carry some sugar containing items with you to your appointment. 4. A driver must accompany you and be prepared to drive you home after your procedure. 5. Bring all your current medications with you. 6. An IV may be inserted and sedation may be given at the discretion of the physician. 7. A blood pressure cuff, EKG, and other monitors will often be applied during the procedure.  Some patients may need to have extra oxygen administered for a short period. 8. You will be asked to provide medical information, including allergies, prior to the procedure.  We must know immediately if you are taking blood  Thinners (like Coumadin) or if you are allergic to IV iodine contrast (dye).  Possible side-effects: All are usually temporary  Bleeding from needle site  Light headedness  Numbness and tingling  Decreased blood pressure  Weakness in arms/legs  Pressure sensation in back/neck  Pain at injection site (several days)  Possible complications: All are extremely rare  Infection  Nerve injury  Spinal headache (a headache wore with upright position)  Call if you experience:  Fever/chills associated with headache or increased back/neck pain  Headache worsened by an upright position  New onset weakness or numbness of an extremity below the injection  site  Hives or difficulty breathing (go to the emergency room)  Inflammation or drainage at the injection site(s)  Severe back/neck pain greater than usual  New symptoms which are concerning to you  Please note:  Although the local anesthetic injected can often make  your back or neck feel good for several hours after the injection the pain will likely return.  It takes 3-5 days for steroids to work on the nerve root. You may not notice any pain relief for at least one week.  If effective, we will often do a series of 3 injections spaced 3-6 weeks apart to maximally decrease your pain.    If you have any questions, please call [phone] [facility]  General Discharge Instructions :  If you need to reach your doctor call: Monday-Friday 8:00 am - 4:00 pm at [phone] or toll free [phone].  After clinic hours [phone] to have operator reach doctor.  Bring all of your medication bottles to all your appointments in the pain clinic.  To cancel or reschedule your appointment with Pain Management please remember to call 24 hours in advance to avoid a fee.  Refer to the educational materials which you have been given on: General Risks, I had my Procedure. Discharge Instructions, Post Sedation.  Post Procedure Instructions:  The drugs you were given will stay in your system until tomorrow, so for the next 24 hours you should not drive, make any legal decisions or drink any alcoholic beverages.  You may eat anything you prefer, but it is better to start with liquids then soups and crackers, and gradually work up to solid foods.  Please notify your doctor immediately if you have any unusual bleeding, trouble breathing or pain that is not related to your normal pain.  Depending on the type of procedure that was done, some parts of your body may feel week and/or numb.  This usually clears up by tonight or the next day.  Walk with the use of an assistive device or accompanied by an adult for the 24 hours.  You may use ice on the affected area for the first 24 hours.  Put ice in a Ziploc bag and cover with a towel and place against area 15 minutes on 15 minutes off.  You may  switch to heat after 24 hours.

## 2016-01-15 NOTE — Progress Notes (Signed)
Safety precautions to be maintained throughout the outpatient stay will include: orient to surroundings, keep bed in low position, maintain call bell within reach at all times, provide assistance with transfer out of bed and ambulation.  

## 2016-01-15 NOTE — Progress Notes (Signed)
Subjective:    Patient ID: Yvette Collier, female    DOB: 07/25/1941, 75 y.o.   MRN: [id number]  HPI  PROCEDURE:  Block of nerves to the sacroiliac joint.   NOTE:  The patient is a 75 y.o. female who [facility] the [facility] for further evaluation and treatment of pain involving the lower back and lower extremity region with pain in the region of the buttocks as well. Prior MRI studies reveal Degenerative disc disease lumbar spine Status post surgery lumbar region with multilevel degenerative changes lumbar spine with L4-L5 level most involved. Facet arthropathy, foraminal stenosis the patient is with severe tenderness to palpation of the PSIS and PII S regions.   There is concern regarding a significant component of the patient's pain being due to sacroiliac joint dysfunction The risks, benefits, expectations of the procedure have been discussed and explained to the patient who is understanding and willing to proceed with interventional treatment in attempt to decrease severity of patient's symptoms, minimize the risk of medication escalation and  hopefully retard the progression of the patient's symptoms. We will proceed with what is felt to be a medically necessary procedure, block of nerves to the sacroiliac joint.   DESCRIPTION OF PROCEDURE:  Block of nerves to the sacroiliac joint.   The patient was taken to the fluoroscopy suite. With the patient in the prone position with EKG, blood pressure, pulse and pulse oximetry monitoring, IV Versed, IV fentanyl conscious sedation, Betadine prep of proposed entry site was performed.   Block of nerves at the L5 vertebral body level.   With the patient in prone position, under fluoroscopic guidance, a 22 -gauge needle was inserted at the L5 vertebral body level on the left side. With 15 degrees oblique orientation a 22 -gauge needle was inserted in the region known as Burton's eye or eye of the Scotty dog. Following documentation of needle  placement in the area of Burton's eye or eye of the Scotty dog under fluoroscopic guidance, needle placement was then accomplished at the sacral ala level on the left side.   Needle placement at the sacral ala.   With the patient in prone position under fluoroscopic guidance with AP view of the lumbosacral spine, a 22 -gauge needle was inserted in the region known as the sacral ala on the left side. Following documentation of needle placement on the left side under fluoroscopic guidance needle placement was then accomplished at the S1 foramen level.   Needle placement at the S1 foramen level.   With the patient in prone position under fluoroscopic guidance with AP view of the lumbosacral spine and cephalad orientation, a 22 -gauge needle was inserted at the superior and lateral border of the S1 foramen on the left side. Following documentation of needle placement at the S1 foramen level on the left side, needle placement was then accomplished at the S2 foramen level on the left side.   Needle placement at the S2 foramen level.   With the patient in prone position with AP view of the lumbosacral spine with cephalad orientation, a 22 - gauge needle was inserted at the superior and lateral border of the S2 foramen under fluoroscopic guidance on the left side. Following needle placement at the L5 vertebral body level, sacral ala, S1 foramen and S2 foramen on the left side, needle placement was verified on lateral view under fluoroscopic guidance.  Following needle placement documentation on lateral view, each needle was injected with 1 mL of  0.25% bupivacaine and Kenalog.   BLOCK OF THE NERVES TO SACROILIAC JOINT ON THE RIGHT SIDE The procedure was performed on the right side at the same levels as was performed on the left side and utilizing the same technique as on the left side and was performed under fluoroscopic guidance as on the left side   A total of  of Kenalog was utilized for the  procedure.   PLAN:  1. Medications: The patient will continue presently prescribed medications. Lyrica and oxycodone  2. The patient will be considered for modification of treatment regimen pending response to the procedure performed on today's visit.  3. The patient is to follow-up with primary care physician Dr. Lawerance Bach evaluation of blood pressure diabetes mellitus and general medical condition following the steroid procedure performed on today's visit.  4. Surgical evaluation as discussed. Patient will follow-up with Dr.Cram   5. Neurological evaluation as discussed. May consider [facility] EMG studies  6. The patient may be a candidate for radiofrequency procedures, implantation devices and other treatment pending response to treatment performed on today's visit and follow-up evaluation.  7. The patient has been advised to adhere to proper body mechanics and to avoid activities which may exacerbate the patient's symptoms.   Return appointment to [facility] as scheduled.    Review of Systems     Objective:   Physical Exam        Assessment & Plan:

## 2016-01-16 ENCOUNTER — Telehealth: Payer: Self-pay | Admitting: *Deleted

## 2016-01-16 NOTE — Telephone Encounter (Signed)
No problems post procedure. 

## 2016-02-06 ENCOUNTER — Encounter: Payer: Self-pay | Admitting: Pain Medicine

## 2016-02-06 ENCOUNTER — Ambulatory Visit: Payer: Medicare Other | Attending: Pain Medicine | Admitting: Pain Medicine

## 2016-02-06 VITALS — BP 143/61 | HR 44 | Temp 98.8°F | Resp 16 | Ht 65.0 in | Wt 162.0 lb

## 2016-02-06 DIAGNOSIS — M7060 Trochanteric bursitis, unspecified hip: Secondary | ICD-10-CM

## 2016-02-06 DIAGNOSIS — M4806 Spinal stenosis, lumbar region: Secondary | ICD-10-CM | POA: Insufficient documentation

## 2016-02-06 DIAGNOSIS — M533 Sacrococcygeal disorders, not elsewhere classified: Secondary | ICD-10-CM | POA: Insufficient documentation

## 2016-02-06 DIAGNOSIS — M48062 Spinal stenosis, lumbar region with neurogenic claudication: Secondary | ICD-10-CM

## 2016-02-06 DIAGNOSIS — M5116 Intervertebral disc disorders with radiculopathy, lumbar region: Secondary | ICD-10-CM | POA: Insufficient documentation

## 2016-02-06 DIAGNOSIS — M5416 Radiculopathy, lumbar region: Secondary | ICD-10-CM

## 2016-02-06 DIAGNOSIS — M47816 Spondylosis without myelopathy or radiculopathy, lumbar region: Secondary | ICD-10-CM | POA: Insufficient documentation

## 2016-02-06 DIAGNOSIS — E1340 Other specified diabetes mellitus with diabetic neuropathy, unspecified: Secondary | ICD-10-CM

## 2016-02-06 DIAGNOSIS — M961 Postlaminectomy syndrome, not elsewhere classified: Secondary | ICD-10-CM

## 2016-02-06 DIAGNOSIS — Z9889 Other specified postprocedural states: Secondary | ICD-10-CM | POA: Diagnosis not present

## 2016-02-06 DIAGNOSIS — M79606 Pain in leg, unspecified: Secondary | ICD-10-CM | POA: Diagnosis present

## 2016-02-06 DIAGNOSIS — M545 Low back pain: Secondary | ICD-10-CM | POA: Diagnosis present

## 2016-02-06 DIAGNOSIS — M5136 Other intervertebral disc degeneration, lumbar region: Secondary | ICD-10-CM

## 2016-02-06 MED ORDER — OXYCODONE HCL 5 MG PO TABS: ORAL_TABLET | ORAL | Status: DC

## 2016-02-06 MED ORDER — PREGABALIN 25 MG PO CAPS: ORAL_CAPSULE | ORAL | Status: DC

## 2016-02-06 NOTE — Progress Notes (Signed)
   Subjective:    Patient ID: Yvette Collier, female    DOB: [birth year], 75 y.o.   MRN: [id number]  HPI  The patient is a 75 year old female who returns to pain management for further evaluation and treatment of pain involving the lower back and lower extremity region predominantly. The patient states that the pain radiation the lumbar region of the lower extremity reproducing pain of moderately severe degree. The pain is aggravated with standing and walking and becomes more intense as the day progresses. The patient also has difficulty sleeping due to the pain which becomes more intense as the day continues. We discussed patient's condition and patient is without plans for additional surgical intervention of the lumbar region. We will proceed with lumbosacral selective nerve root block to be performed at time return appointment in attempt to decrease severity of symptoms, minimize progression of symptoms, and avoid the need for more involved treatment. The patient continues oxycodone and Lyrica and has severe disabling pain despite present treatment regimen. The patient is with understanding and agreed to suggested treatment plan. We will also discussed patient degenerative joint disease of the hip as well as orthopedic and neurosurgical follow-up evaluations.  Review of Systems     Objective:   Physical Exam  There was tenderness of the splenius capitis and occipitalis musculature regions palpation which reproduces mild discomfort. There was mild tenderness of the cervical facet cervical paraspinal musculature region and patient appeared to be with mild tenderness of the acromial clavicular and glenohumeral joint regions. There appeared to be unremarkable Spurling's maneuver. Tinel and Phalen's maneuver were without increased pain of significant degree. Palpation of the thoracic paraspinal musculature and thoracic facet region was with evidence of moderate muscle spasms of the lower thoracic paraspinal  musculature region with no crepitus of the thoracic region noted. Palpation over the lumbar paraspinal musculature region lumbar facet region was associated with moderately severe increased pain with severe tenderness to palpation of the gluteal and piriformis musculature region. There was moderate tenderness of the PSIS and PII S region. Straight leg raising was tolerates approximately 20 without a definite increased pain with dorsiflexion noted. There was questionably decreased sensation of the L5 dermatomal distribution with negative clonus negative Homans. Abdomen was nontender and no costovertebral angle tenderness was noted.          Assessment & Plan:      Degenerative disc disease lumbar spine Status post surgery lumbar region with multilevel degenerative changes lumbar spine with L4-L5 level most involved. Facet arthropathy, foraminal stenosis  Lumbar radiculopathy  Lumbar facet syndrome  Sacroiliac joint dysfunction      PLAN   Continue present medication Lyrica and oxycodone   Lumbosacral selective nerve root block to be performed at time return appointment  F/U PCP Dr. Lawerance Bach for evaliation of  BP and general medical  condition. Also discuss with Dr. Lawerance Bach increasing Lyrica to 1 pill twice per day if tolerated . Caution the extra Lyrica can cause drowsiness confusion excessive sedation respiratory depression and other side effects  F/U surgical evaluation. Patient will follow-up with Dr.Cram   F/U neurological evaluation. May consider pending follow-up evaluations  May consider radiofrequency rhizolysis or intraspinal procedures pending response to present treatment and F/U evaluation.  Patient to call [facility] should patient have concerns prior to scheduled return appointment

## 2016-02-06 NOTE — Patient Instructions (Addendum)
PLAN   Continue present medication Lyrica and oxycodone   Lumbosacral selective nerve root block to be performed at time return appointment  F/U PCP Dr. Lawerance Bach for evaliation of  BP and general medical  condition. Also discuss with Dr. Lawerance Bach increasing Lyrica to 1 pill twice per day if tolerated . Caution the extra Lyrica can cause drowsiness confusion excessive sedation respiratory depression and other side effects  F/U surgical evaluation. Patient will follow-up with Dr.Cram   F/U neurological evaluation. May consider pending follow-up evaluations  May consider radiofrequency rhizolysis or intraspinal procedures pending response to present treatment and F/U evaluation.  Patient to call [facility] should patient have concerns prior to scheduled return appointmentSelective Nerve Root Block Patient Information  Description: Specific nerve roots exit the spinal canal and these nerves can be compressed and inflamed by a bulging disc and bone spurs.  By injecting steroids on the nerve root, we can potentially decrease the inflammation surrounding these nerves, which often leads to decreased pain.  Also, by injecting local anesthesia on the nerve root, this can provide [place] helpful information to give to your referring doctor if it decreases your pain.  Selective nerve root blocks can be done along the spine from the neck to the low back depending on the location of your pain.   After numbing the skin with local anesthesia, a small needle is passed to the nerve root and the position of the needle is verified using x-ray pictures.  After the needle is in correct position, we then deposit the medication.  You may experience a pressure sensation while this is being done.  The entire block usually lasts less than 15 minutes.  Conditions that may be treated with selective nerve root blocks:  Low back and leg pain  Spinal stenosis  Diagnostic block prior to potential surgery  Neck and arm  pain  Post laminectomy syndrome  Preparation for the injection:  1. Do not eat any solid food or dairy products within 6 hours of your appointment. 2. You may drink clear liquids up to 2 hours before an appointment.  Clear liquids include water, black coffee, juice or soda.  No milk or cream please. 3. You may take your regular medications, including pain medications, with a sip of water before your appointment.  Diabetics should hold regular insulin (if taken separately) and take 1/2 normal NPH dose the morning of the procedure.  Carry some sugar containing items with you to your appointment. 4. A driver must accompany you and be prepared to drive you home after your procedure. 5. Bring all your current medications with you. 6. An IV may be inserted and sedation may be given at the discretion of the physician. 7. A blood pressure cuff, EKG, and other monitors will often be applied during the procedure.  Some patients may need to have extra oxygen administered for a short period. 8. You will be asked to provide medical information, including allergies, prior to the procedure.  We must know immediately if you are taking blood  Thinners (like Coumadin) or if you are allergic to IV iodine contrast (dye).  Possible side-effects: All are usually temporary  Bleeding from needle site  Light headedness  Numbness and tingling  Decreased blood pressure  Weakness in arms/legs  Pressure sensation in back/neck  Pain at injection site (several days)  Possible complications: All are extremely rare  Infection  Nerve injury  Spinal headache (a headache wore with upright position)  Call if you  experience:  Fever/chills associated with headache or increased back/neck pain  Headache worsened by an upright position  New onset weakness or numbness of an extremity below the injection site  Hives or difficulty breathing (go to the emergency room)  Inflammation or drainage at the  injection site(s)  Severe back/neck pain greater than usual  New symptoms which are concerning to you  Please note:  Although the local anesthetic injected can often make your back or neck feel good for several hours after the injection the pain will likely return.  It takes 3-5 days for steroids to work on the nerve root. You may not notice any pain relief for at least one week.  If effective, we will often do a series of 3 injections spaced 3-6 weeks apart to maximally decrease your pain.    If you have any questions, please call [phone] [facility] Clinic[facility] R[facility]KS AND COMPLICATIONS  What are the risk, side effects and possible complications? Generally speaking, most procedures are safe.  However, with any procedure there are risks, side effects, and the possibility of complications.  The risks and complications are dependent upon the sites that are lesioned, or the type of nerve block to be performed.  The closer the procedure is to the spine, the more serious the risks are.  Great care is taken when placing the radio frequency needles, block needles or lesioning probes, but sometimes complications can occur. 1. Infection: Any time there is an injection through the skin, there is a risk of infection.  This is why sterile conditions are used for these blocks.  There are four possible types of infection. 1. Localized skin infection. 2. Central Nervous System Infection-This can be in the form of Meningitis, which can be deadly. 3. Epidural Infections-This can be in the form of an epidural abscess, which can cause pressure inside of the spine, causing compression of the spinal cord with subsequent paralysis. This would require an emergency surgery to decompress, and there are no guarantees that the patient would recover from the paralysis. 4. Discitis-This is an infection of the intervertebral discs.  It occurs in about 1% of discography procedures.  It is  difficult to treat and it may lead to surgery.        2. Pain: the needles have to go through skin and soft tissues, will cause soreness.       3. Damage to internal structures:  The nerves to be lesioned may be near blood vessels or    other nerves which can be potentially damaged.       4. Bleeding: Bleeding is more common if the patient is taking blood thinners such as  aspirin, Coumadin, Ticiid, Plavix, etc., or if he/she have some genetic predisposition  such as hemophilia. Bleeding into the spinal canal can cause compression of the spinal  cord with subsequent paralysis.  This would require an emergency surgery to  decompress and there are no guarantees that the patient would recover from the  paralysis.       5. Pneumothorax:  Puncturing of a lung is a possibility, every time a needle is introduced in  the area of the chest or upper back.  Pneumothorax refers to free air around the  collapsed lung(s), inside of the thoracic cavity (chest cavity).  Another two possible  complications related to a similar event would include: Hemothorax and Chylothorax.   These are variations of the Pneumothorax, where instead of air around the collapsed  lung(s), you may have blood or chyle, respectively.       6. Spinal headaches: They may occur with any procedures in the area of the spine.       7. Persistent CSF (Cerebro-Spinal Fluid) leakage: This is a rare problem, but may occur  with prolonged intrathecal or epidural catheters either due to the formation of a fistulous  track or a dural tear.       8. Nerve damage: By working so close to the spinal cord, there is always a possibility of  nerve damage, which could be as serious as a permanent spinal cord injury with  paralysis.       9. Death:  Although rare, severe deadly allergic reactions known as "Anaphylactic  reaction" can occur to any of the medications used.      10. Worsening of the symptoms:  We can always make thing worse.  What are the chances of  something like this happening? Chances of any of this occuring are extremely low.  By statistics, you have more of a chance of getting killed in a motor vehicle accident: while driving to the hospital than any of the above occurring .  Nevertheless, you should be aware that they are possibilities.  In general, it is similar to taking a shower.  Everybody knows that you can slip, hit your head and get killed.  Does that mean that you should not shower again?  Nevertheless always keep in mind that statistics do not mean anything if you happen to be on the wrong side of them.  Even if a procedure has a 1 (one) in a 1,000,000 (million) chance of going wrong, it you happen to be that one..Also, keep in mind that by statistics, you have more of a chance of having something go wrong when taking medications.  Who should not have this procedure? If you are on a blood thinning medication (e.g. Coumadin, Plavix, see list of "Blood Thinners"), or if you have an active infection going on, you should not have the procedure.  If you are taking any blood thinners, please inform your physician.  How should I prepare for this procedure?  Do not eat or drink anything at least six hours prior to the procedure.  Bring a driver with you .  It cannot be a taxi.  Come accompanied by an adult that can drive you back, and that is strong enough to help you if your legs get weak or numb from the local anesthetic.  Take all of your medicines the morning of the procedure with just enough water to swallow them.  If you have diabetes, make sure that you are scheduled to have your procedure done first thing in the morning, whenever possible.  If you have diabetes, take only half of your insulin dose and notify our nurse that you have done so as soon as you arrive at the clinic.  If you are diabetic, but only take blood sugar pills (oral hypoglycemic), then do not take them on the morning of your procedure.  You may take them  after you have had the procedure.  Do not take aspirin or any aspirin-containing medications, at least eleven (11) days prior to the procedure.  They may prolong bleeding.  Wear loose fitting clothing that may be easy to take off and that you would not mind if it got stained with Betadine or blood.  Do not wear any jewelry or perfume  Remove any nail coloring.  It  will interfere with some of our monitoring equipment.  NOTE: Remember that this is not meant to be interpreted as a complete list of all possible complications.  Unforeseen problems may occur.  BLOOD THINNERS The following drugs contain aspirin or other products, which can cause increased bleeding during surgery and should not be taken for 2 weeks prior to and 1 week after surgery.  If you should need take something for relief of minor pain, you may take acetaminophen which is found in Tylenol,m Datril, Anacin-3 and Panadol. It is not blood thinner. The products listed below are.  Do not take any of the products listed below in addition to any listed on your instruction sheet.  A.P.C or A.P.C with Codeine Codeine Phosphate Capsules #3 Ibuprofen Ridaura  ABC compound Congesprin Imuran rimadil  Advil Cope Indocin Robaxisal  Alka-Seltzer Effervescent Pain Reliever and Antacid Coricidin or Coricidin-D  Indomethacin Rufen  Alka-Seltzer plus Cold Medicine Cosprin Ketoprofen S-A-C Tablets  Anacin Analgesic Tablets or Capsules Coumadin Korlgesic Salflex  Anacin Extra Strength Analgesic tablets or capsules CP-2 Tablets Lanoril Salicylate  Anaprox Cuprimine Capsules Levenox Salocol  Anexsia-D Dalteparin Magan Salsalate  Anodynos Darvon compound Magnesium Salicylate Sine-off  Ansaid Dasin Capsules Magsal Sodium Salicylate  Anturane Depen Capsules Marnal Soma  APF Arthritis pain formula Dewitt's Pills Measurin Stanback  Argesic Dia-Gesic Meclofenamic Sulfinpyrazone  Arthritis Bayer Timed Release Aspirin Diclofenac Meclomen Sulindac   Arthritis pain formula Anacin Dicumarol Medipren Supac  Analgesic (Safety coated) Arthralgen Diffunasal Mefanamic Suprofen  Arthritis Strength Bufferin Dihydrocodeine Mepro Compound Suprol  Arthropan liquid Dopirydamole Methcarbomol with Aspirin Synalgos  ASA tablets/Enseals Disalcid Micrainin Tagament  Ascriptin Doan's Midol Talwin  Ascriptin A/D Dolene Mobidin Tanderil  Ascriptin Extra Strength Dolobid Moblgesic Ticlid  Ascriptin with Codeine Doloprin or Doloprin with Codeine Momentum Tolectin  Asperbuf Duoprin Mono-gesic Trendar  Aspergum Duradyne Motrin or Motrin IB Triminicin  Aspirin plain, buffered or enteric coated Durasal Myochrisine Trigesic  Aspirin Suppositories Easprin Nalfon Trillsate  Aspirin with Codeine Ecotrin Regular or Extra Strength Naprosyn Uracel  Atromid-S Efficin Naproxen Ursinus  Auranofin Capsules Elmiron Neocylate Vanquish  Axotal Emagrin Norgesic Verin  Azathioprine Empirin or Empirin with Codeine Normiflo Vitamin E  Azolid Emprazil Nuprin Voltaren  Bayer Aspirin plain, buffered or children's or timed BC Tablets or powders Encaprin Orgaran Warfarin Sodium  Buff-a-Comp Enoxaparin Orudis Zorpin  Buff-a-Comp with Codeine Equegesic Os-Cal-Gesic   Buffaprin Excedrin plain, buffered or Extra Strength Oxalid   Bufferin Arthritis Strength Feldene Oxphenbutazone   Bufferin plain or Extra Strength Feldene Capsules Oxycodone with Aspirin   Bufferin with Codeine Fenoprofen Fenoprofen Pabalate or Pabalate-SF   Buffets II Flogesic Panagesic   Buffinol plain or Extra Strength Florinal or Florinal with Codeine Panwarfarin   Buf-Tabs Flurbiprofen Penicillamine   Butalbital Compound Four-way cold tablets Penicillin   Butazolidin Fragmin Pepto-Bismol   Carbenicillin Geminisyn Percodan   Carna Arthritis Reliever Geopen Persantine   Carprofen Gold's salt Persistin   Chloramphenicol Goody's Phenylbutazone   Chloromycetin Haltrain Piroxlcam   Clmetidine heparin Plaquenil    Cllnoril Hyco-pap Ponstel   Clofibrate Hydroxy chloroquine Propoxyphen         Before stopping any of these medications, be sure to consult the physician who ordered them.  Some, such as Coumadin (Warfarin) are ordered to prevent or treat serious conditions such as "deep thrombosis", "pumonary embolisms", and other heart problems.  The amount of time that you may need off of the medication may also vary with the medication and the reason for which you were  taking it.  If you are taking any of these medications, please make sure you notify your pain physician before you undergo any procedures.

## 2016-02-06 NOTE — Progress Notes (Signed)
Safety precautions to be maintained throughout the outpatient stay will include: orient to surroundings, keep bed in low position, maintain call bell within reach at all times, provide assistance with transfer out of bed and ambulation.  

## 2016-02-18 ENCOUNTER — Encounter: Payer: Self-pay | Admitting: Emergency Medicine

## 2016-02-18 ENCOUNTER — Emergency Department: Payer: No Typology Code available for payment source

## 2016-02-18 ENCOUNTER — Emergency Department
Admission: EM | Admit: 2016-02-18 | Discharge: 2016-02-18 | Disposition: A | Discharge status: Home / Self Care | Payer: No Typology Code available for payment source | Attending: Emergency Medicine | Admitting: Emergency Medicine

## 2016-02-18 DIAGNOSIS — S199XXA Unspecified injury of neck, initial encounter: Secondary | ICD-10-CM | POA: Insufficient documentation

## 2016-02-18 DIAGNOSIS — S8002XA Contusion of left knee, initial encounter: Secondary | ICD-10-CM | POA: Insufficient documentation

## 2016-02-18 DIAGNOSIS — S29001A Unspecified injury of muscle and tendon of front wall of thorax, initial encounter: Secondary | ICD-10-CM | POA: Insufficient documentation

## 2016-02-18 DIAGNOSIS — I119 Hypertensive heart disease without heart failure: Secondary | ICD-10-CM | POA: Diagnosis not present

## 2016-02-18 DIAGNOSIS — Y998 Other external cause status: Secondary | ICD-10-CM | POA: Diagnosis not present

## 2016-02-18 DIAGNOSIS — Z79899 Other long term (current) drug therapy: Secondary | ICD-10-CM | POA: Diagnosis not present

## 2016-02-18 DIAGNOSIS — Z87891 Personal history of nicotine dependence: Secondary | ICD-10-CM | POA: Diagnosis not present

## 2016-02-18 DIAGNOSIS — Z792 Long term (current) use of antibiotics: Secondary | ICD-10-CM | POA: Diagnosis not present

## 2016-02-18 DIAGNOSIS — Y9389 Activity, other specified: Secondary | ICD-10-CM | POA: Diagnosis not present

## 2016-02-18 DIAGNOSIS — Z7982 Long term (current) use of aspirin: Secondary | ICD-10-CM | POA: Diagnosis not present

## 2016-02-18 DIAGNOSIS — Z7984 Long term (current) use of oral hypoglycemic drugs: Secondary | ICD-10-CM | POA: Diagnosis not present

## 2016-02-18 DIAGNOSIS — F419 Anxiety disorder, unspecified: Secondary | ICD-10-CM | POA: Diagnosis not present

## 2016-02-18 DIAGNOSIS — R0789 Other chest pain: Secondary | ICD-10-CM

## 2016-02-18 DIAGNOSIS — S8992XA Unspecified injury of left lower leg, initial encounter: Secondary | ICD-10-CM | POA: Diagnosis present

## 2016-02-18 DIAGNOSIS — E119 Type 2 diabetes mellitus without complications: Secondary | ICD-10-CM | POA: Diagnosis not present

## 2016-02-18 DIAGNOSIS — Y92410 Unspecified street and highway as the place of occurrence of the external cause: Secondary | ICD-10-CM | POA: Insufficient documentation

## 2016-02-18 DIAGNOSIS — S79912A Unspecified injury of left hip, initial encounter: Secondary | ICD-10-CM | POA: Diagnosis not present

## 2016-02-18 DIAGNOSIS — R0781 Pleurodynia: Secondary | ICD-10-CM

## 2016-02-18 DIAGNOSIS — M7918 Myalgia, other site: Secondary | ICD-10-CM

## 2016-02-18 MED ORDER — IBUPROFEN 400 MG PO TABS
400.0000 mg | ORAL_TABLET | Freq: Once | ORAL | Status: AC
  Administered 2016-02-18: 400 mg via ORAL
  Filled 2016-02-18: qty 1
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
## 2016-02-26 ENCOUNTER — Telehealth: Payer: Self-pay | Admitting: Pain Medicine
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
## 2016-02-27 NOTE — Telephone Encounter (Signed)
Yvette Collier and Nurses Schedule patient for evaluation with possible procedure ( may perform block of nerves to the sacroiliac joint  or other procedure if insurance will approve for procedure by for Wednesday, 02/28/2016  7:30 AM Nurses Please call patient and described pain for me in further detail

## 2016-02-27 NOTE — Telephone Encounter (Signed)
Spoke with Angie, patient does not need PA for a procedure. She is already scheduled for a procedure tomorrow.

## 2016-02-28 ENCOUNTER — Ambulatory Visit: Payer: Medicare Other | Attending: Pain Medicine | Admitting: Pain Medicine

## 2016-02-28 ENCOUNTER — Encounter: Payer: Self-pay | Admitting: Pain Medicine

## 2016-02-28 VITALS — BP 123/64 | HR 50 | Temp 97.8°F | Resp 16 | Ht 65.0 in | Wt 163.0 lb

## 2016-02-28 DIAGNOSIS — M48062 Spinal stenosis, lumbar region with neurogenic claudication: Secondary | ICD-10-CM

## 2016-02-28 DIAGNOSIS — M961 Postlaminectomy syndrome, not elsewhere classified: Secondary | ICD-10-CM

## 2016-02-28 DIAGNOSIS — M5416 Radiculopathy, lumbar region: Secondary | ICD-10-CM

## 2016-02-28 DIAGNOSIS — M791 Myalgia: Secondary | ICD-10-CM | POA: Diagnosis present

## 2016-02-28 DIAGNOSIS — M7060 Trochanteric bursitis, unspecified hip: Secondary | ICD-10-CM

## 2016-02-28 DIAGNOSIS — M1288 Other specific arthropathies, not elsewhere classified, other specified site: Secondary | ICD-10-CM | POA: Diagnosis not present

## 2016-02-28 DIAGNOSIS — E1340 Other specified diabetes mellitus with diabetic neuropathy, unspecified: Secondary | ICD-10-CM

## 2016-02-28 DIAGNOSIS — M79606 Pain in leg, unspecified: Secondary | ICD-10-CM | POA: Diagnosis present

## 2016-02-28 DIAGNOSIS — Z9889 Other specified postprocedural states: Secondary | ICD-10-CM | POA: Insufficient documentation

## 2016-02-28 DIAGNOSIS — M5136 Other intervertebral disc degeneration, lumbar region: Secondary | ICD-10-CM | POA: Diagnosis not present

## 2016-02-28 DIAGNOSIS — M47816 Spondylosis without myelopathy or radiculopathy, lumbar region: Secondary | ICD-10-CM

## 2016-02-28 DIAGNOSIS — M4806 Spinal stenosis, lumbar region: Secondary | ICD-10-CM | POA: Diagnosis not present

## 2016-02-28 DIAGNOSIS — M533 Sacrococcygeal disorders, not elsewhere classified: Secondary | ICD-10-CM

## 2016-02-28 DIAGNOSIS — M545 Low back pain: Secondary | ICD-10-CM | POA: Diagnosis present

## 2016-02-28 MED ORDER — FENTANYL CITRATE (PF) 100 MCG/2ML IJ SOLN
INTRAMUSCULAR | Status: AC
  Administered 2016-02-28: 50 ug via INTRAVENOUS
  Filled 2016-02-28: qty 2

## 2016-02-28 MED ORDER — FENTANYL CITRATE (PF) 100 MCG/2ML IJ SOLN
100.0000 ug | Freq: Once | INTRAMUSCULAR | Status: AC
  Administered 2016-02-28: 50 ug via INTRAVENOUS

## 2016-02-28 MED ORDER — CEFAZOLIN SODIUM 1 G IJ SOLR
INTRAMUSCULAR | Status: AC
  Administered 2016-02-28: 1 g
  Filled 2016-02-28: qty 10

## 2016-02-28 MED ORDER — BUPIVACAINE HCL (PF) 0.25 % IJ SOLN
INTRAMUSCULAR | Status: AC
  Administered 2016-02-28: 30 mL
  Filled 2016-02-28: qty 30

## 2016-02-28 MED ORDER — MIDAZOLAM HCL 5 MG/5ML IJ SOLN
5.0000 mg | Freq: Once | INTRAMUSCULAR | Status: AC
Start: 2016-02-28 — End: 2016-02-28
  Administered 2016-02-28: 3 mg via INTRAVENOUS

## 2016-02-28 MED ORDER — MIDAZOLAM HCL 5 MG/5ML IJ SOLN
INTRAMUSCULAR | Status: AC
Start: 2016-02-28 — End: 2016-02-28
  Administered 2016-02-28: 3 mg via INTRAVENOUS
  Filled 2016-02-28: qty 5

## 2016-02-28 MED ORDER — CEFUROXIME AXETIL 250 MG PO TABS: 250.0000 mg | ORAL_TABLET | Freq: Two times a day (BID) | ORAL | Status: DC

## 2016-02-28 MED ORDER — TRIAMCINOLONE ACETONIDE 40 MG/ML IJ SUSP
40.0000 mg | Freq: Once | INTRAMUSCULAR | Status: AC
  Administered 2016-02-28: 40 mg

## 2016-02-28 MED ORDER — TRIAMCINOLONE ACETONIDE 40 MG/ML IJ SUSP
INTRAMUSCULAR | Status: AC
  Administered 2016-02-28: 40 mg
  Filled 2016-02-28: qty 1

## 2016-02-28 MED ORDER — ORPHENADRINE CITRATE 30 MG/ML IJ SOLN
60.0000 mg | Freq: Once | INTRAMUSCULAR | Status: AC
  Administered 2016-02-28: 60 mg via INTRAMUSCULAR

## 2016-02-28 MED ORDER — LACTATED RINGERS IV SOLN: 1000.0000 mL | INTRAVENOUS | Status: DC

## 2016-02-28 MED ORDER — ORPHENADRINE CITRATE 30 MG/ML IJ SOLN
INTRAMUSCULAR | Status: AC
  Administered 2016-02-28: 60 mg via INTRAMUSCULAR
  Filled 2016-02-28: qty 2

## 2016-02-28 MED ORDER — CEFAZOLIN SODIUM 1-5 GM-% IV SOLN: 1.0000 g | Freq: Once | INTRAVENOUS | Status: DC

## 2016-02-28 MED ORDER — BUPIVACAINE HCL (PF) 0.25 % IJ SOLN
30.0000 mL | Freq: Once | INTRAMUSCULAR | Status: AC
  Administered 2016-02-28: 30 mL
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
## 2016-02-28 NOTE — Progress Notes (Signed)
Safety precautions to be maintained throughout the outpatient stay will include: orient to surroundings, keep bed in low position, maintain call bell within reach at all times, provide assistance with transfer out of bed and ambulation.  

## 2016-02-29 ENCOUNTER — Telehealth: Payer: Self-pay | Admitting: *Deleted

## 2016-02-29 NOTE — Telephone Encounter (Signed)
Patient denies any questions or concerns from procedure on yesterday.  

## 2016-03-04 ENCOUNTER — Ambulatory Visit: Payer: Medicare Other | Admitting: Pain Medicine

## 2016-03-04 ENCOUNTER — Other Ambulatory Visit: Payer: Self-pay

## 2016-03-04 MED ORDER — CHOLESTYRAMINE LIGHT 4 G PO PACK: 4.0000 g | PACK | Freq: Two times a day (BID) | ORAL | Status: DC

## 2016-03-18 ENCOUNTER — Emergency Department: Payer: Medicare Other

## 2016-03-18 ENCOUNTER — Emergency Department
Admission: EM | Admit: 2016-03-18 | Discharge: 2016-03-18 | Disposition: A | Discharge status: Home / Self Care | Payer: Medicare Other | Attending: Student | Admitting: Student

## 2016-03-18 ENCOUNTER — Encounter: Payer: Self-pay | Admitting: Emergency Medicine

## 2016-03-18 DIAGNOSIS — Z79899 Other long term (current) drug therapy: Secondary | ICD-10-CM | POA: Diagnosis not present

## 2016-03-18 DIAGNOSIS — M545 Low back pain: Secondary | ICD-10-CM | POA: Diagnosis not present

## 2016-03-18 DIAGNOSIS — Z87891 Personal history of nicotine dependence: Secondary | ICD-10-CM | POA: Insufficient documentation

## 2016-03-18 DIAGNOSIS — E0840 Diabetes mellitus due to underlying condition with diabetic neuropathy, unspecified: Secondary | ICD-10-CM | POA: Diagnosis not present

## 2016-03-18 DIAGNOSIS — M542 Cervicalgia: Secondary | ICD-10-CM | POA: Diagnosis present

## 2016-03-18 DIAGNOSIS — I10 Essential (primary) hypertension: Secondary | ICD-10-CM | POA: Diagnosis not present

## 2016-03-18 DIAGNOSIS — R079 Chest pain, unspecified: Secondary | ICD-10-CM | POA: Insufficient documentation

## 2016-03-18 DIAGNOSIS — Z7982 Long term (current) use of aspirin: Secondary | ICD-10-CM | POA: Insufficient documentation

## 2016-03-18 DIAGNOSIS — G8929 Other chronic pain: Secondary | ICD-10-CM | POA: Insufficient documentation

## 2016-03-18 LAB — CBC WITH DIFFERENTIAL/PLATELET
BASOS ABS: 0 10*3/uL (ref 0–0.1)
BASOS PCT: 1 %
EOS ABS: 0 10*3/uL (ref 0–0.7)
Eosinophils Relative: 0 %
HEMATOCRIT: 33.1 % — AB (ref 35.0–47.0)
HEMOGLOBIN: 11 g/dL — AB (ref 12.0–16.0)
Lymphocytes Relative: 5 %
Lymphs Abs: 0.4 10*3/uL — ABNORMAL LOW (ref 1.0–3.6)
MCH: 30 pg (ref 26.0–34.0)
MCHC: 33.2 g/dL (ref 32.0–36.0)
MCV: 90.2 fL (ref 80.0–100.0)
MONO ABS: 0.4 10*3/uL (ref 0.2–0.9)
Monocytes Relative: 5 %
NEUTROS ABS: 7 10*3/uL — AB (ref 1.4–6.5)
NEUTROS PCT: 89 %
Platelets: 249 10*3/uL (ref 150–440)
RBC: 3.67 MIL/uL — ABNORMAL LOW (ref 3.80–5.20)
RDW: 13 % (ref 11.5–14.5)
WBC: 7.8 10*3/uL (ref 3.6–11.0)

## 2016-03-18 LAB — BASIC METABOLIC PANEL
ANION GAP: 7 (ref 5–15)
BUN: 21 mg/dL — ABNORMAL HIGH (ref 6–20)
CALCIUM: 9.5 mg/dL (ref 8.9–10.3)
CO2: 26 mmol/L (ref 22–32)
Chloride: 105 mmol/L (ref 101–111)
Creatinine, Ser: 1.1 mg/dL — ABNORMAL HIGH (ref 0.44–1.00)
GFR calc Af Amer: 55 mL/min — ABNORMAL LOW (ref >60)
GFR, EST NON AFRICAN AMERICAN: 48 mL/min — AB (ref >60)
Glucose, Bld: 120 mg/dL — ABNORMAL HIGH (ref 65–99)
POTASSIUM: 4.1 mmol/L (ref 3.5–5.1)
SODIUM: 138 mmol/L (ref 135–145)

## 2016-03-18 LAB — TROPONIN I

## 2016-03-18 MED ORDER — OXYCODONE HCL 5 MG PO TABS
10.0000 mg | ORAL_TABLET | Freq: Once | ORAL | Status: AC
  Administered 2016-03-18: 10 mg via ORAL
  Filled 2016-03-18: qty 2

## 2016-03-18 MED ORDER — KETOROLAC TROMETHAMINE 60 MG/2ML IM SOLN
15.0000 mg | Freq: Once | INTRAMUSCULAR | Status: AC
  Administered 2016-03-18: 15 mg via INTRAMUSCULAR
  Filled 2016-03-18: qty 2
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
## 2016-03-18 NOTE — ED Notes (Signed)
Family at bedside. 
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
## 2016-03-26 ENCOUNTER — Encounter: Payer: Self-pay | Admitting: Pain Medicine

## 2016-03-26 ENCOUNTER — Ambulatory Visit: Payer: Medicare Other | Attending: Pain Medicine | Admitting: Pain Medicine

## 2016-03-26 VITALS — BP 153/74 | HR 50 | Temp 98.7°F | Resp 20 | Ht 65.0 in | Wt 161.0 lb

## 2016-03-26 DIAGNOSIS — M533 Sacrococcygeal disorders, not elsewhere classified: Secondary | ICD-10-CM | POA: Insufficient documentation

## 2016-03-26 DIAGNOSIS — M7060 Trochanteric bursitis, unspecified hip: Secondary | ICD-10-CM

## 2016-03-26 DIAGNOSIS — M47896 Other spondylosis, lumbar region: Secondary | ICD-10-CM | POA: Diagnosis not present

## 2016-03-26 DIAGNOSIS — M940 Chondrocostal junction syndrome [Tietze]: Secondary | ICD-10-CM | POA: Diagnosis not present

## 2016-03-26 DIAGNOSIS — M47816 Spondylosis without myelopathy or radiculopathy, lumbar region: Secondary | ICD-10-CM

## 2016-03-26 DIAGNOSIS — Z9889 Other specified postprocedural states: Secondary | ICD-10-CM | POA: Insufficient documentation

## 2016-03-26 DIAGNOSIS — M5116 Intervertebral disc disorders with radiculopathy, lumbar region: Secondary | ICD-10-CM | POA: Insufficient documentation

## 2016-03-26 DIAGNOSIS — G588 Other specified mononeuropathies: Secondary | ICD-10-CM | POA: Insufficient documentation

## 2016-03-26 DIAGNOSIS — M5136 Other intervertebral disc degeneration, lumbar region: Secondary | ICD-10-CM

## 2016-03-26 DIAGNOSIS — M961 Postlaminectomy syndrome, not elsewhere classified: Secondary | ICD-10-CM

## 2016-03-26 DIAGNOSIS — M542 Cervicalgia: Secondary | ICD-10-CM | POA: Diagnosis present

## 2016-03-26 DIAGNOSIS — M62838 Other muscle spasm: Secondary | ICD-10-CM | POA: Insufficient documentation

## 2016-03-26 DIAGNOSIS — M5416 Radiculopathy, lumbar region: Secondary | ICD-10-CM

## 2016-03-26 DIAGNOSIS — M546 Pain in thoracic spine: Secondary | ICD-10-CM | POA: Diagnosis present

## 2016-03-26 DIAGNOSIS — E1340 Other specified diabetes mellitus with diabetic neuropathy, unspecified: Secondary | ICD-10-CM

## 2016-03-26 DIAGNOSIS — M48062 Spinal stenosis, lumbar region with neurogenic claudication: Secondary | ICD-10-CM

## 2016-03-26 MED ORDER — PREGABALIN 25 MG PO CAPS: ORAL_CAPSULE | ORAL | Status: DC

## 2016-03-26 MED ORDER — OXYCODONE HCL 5 MG PO TABS: ORAL_TABLET | ORAL | Status: DC

## 2016-03-26 NOTE — Patient Instructions (Addendum)
PLAN   Continue present medication Lyrica and oxycodone   Intercostal nerve blocks to be performed at time of return appointment  F/U PCP Dr. Lawerance Bach for evaliation of  BP and general medical  condition. Also discuss with Dr. Lawerance Bach increasing Lyrica to 1 pill twice per day if tolerated as we previously discussed . Caution the extra Lyrica can cause drowsiness confusion excessive sedation respiratory depression and other side effects  F/U surgical evaluation. Patient will follow-up with Dr.Cram   F/U neurological evaluation. May consider pending follow-up evaluations  May consider radiofrequency rhizolysis or intraspinal procedures pending response to present treatment and F/U evaluation.  Patient to call [facility] should patient have concerns prior to scheduled return appointmentIntercostal Nerve Block Patient Information  Description: The twelve intercostal nerves arise from the first thru twelfth thoracic nerve roots.  The nerve begins at the spine and wraps around the body, lying in a groove underneath each rib.  Each intercostal nerve innervates a specific strip of skin and body walk of the abdomen and chest.  Therefore, injuries of the chest wall or abdominal wall result in pain that is transmitted back to the brian via the intercostal nerves.  Examples of such injuries include rib fractures and incisions for lung and gall bladder surgery.  Occasionally, pain may persist long after an injury or surgical incision secondary to inflammation and irritation of the intercostal nerve.  The longstanding pain is known as intercostal neuralgia.  An intercostal nerve block is preformed to eliminate pain either temporarily or permanently.  A small needle is placed below the rib and local anesthetic (like Novocaine) and possibly steroid is injected.  Usually 2-4 intercostal nerves are blocked at a time depending on the problem.  The patient will experience a slight "pin-prick" sensation for  each injection.  Shortly thereafter, the strip of skin that is innervated by the blocked intercostal nerve will feel numb.  Persistent pain that is only temporarily relieved with local anesthetic may require a more permanent block. This procedure is called Cryoneurolysis and entails placing a small probe beneath the rib to freeze the nerve.  Conditions that may be treated by intercostal nerve blocks:   Rib fractures  Longstanding pain from surgery of the chest or abdomen (intercostal neuralgia)  Pain from chest tubes  Pain from trauma to the chest  Preparation for the injections:  1. Do not eat any solid food or dairy products within 8 hours of your appointment. 2. You may drink clear liquids up to 3 hours before appointment.  Clear liquids include water, black coffee, juice or soda.  No milk or cream please. 3. You may take your regular medication, including pain medications, with a sip of water before your appointment.  Diabetics should hold regular insulin (if take separately) and take 1/2 normal NPH dose the morning of the procedure.   Carry some sugar containing items with you to your appointment. 4. A driver must accompany you and be prepared to drive you home after your procedure. 5. Bring all your current medications with you. 6. An IV may be inserted and sedation may be given at the discretion of the physician. 7. A blood pressure cuff, EKG and other monitors will often be applied during the procedure.  Some patients may need to have extra oxygen administered for a short period. 8. You will be asked to provide medical information, including your allergies, prior to the procedure.  We must know immediately if you are taking blood thinners (like  Coumadin/Warfarin) or if you are allergic to IV iodine contrast (dye). We must know if you could possible be pregnant.  Possible side-effects:   Bleeding from needle site  Infection (rare)  Nerve injury (rare)  Numbness & tingling of  skin  Collapsed lung requiring chest tube (rare)  Local anesthetic toxicity (rare)  Light-headedness (temporary)  Pain at injection site (several days)  Decreased blood pressure (temporary)  Shortness of breath  Jittery/shaking sensation (temporary)  Call if you experience:   Difficulty breathing or hives (go directly to the emergency room)  Redness, inflammation or drainage at the injection site  Severe pain at the site of the injection  Any new symptoms which are concerning   Please note:  Your pain may subside immediately but may return several hours after the injection.  Often, more than one injection is required to reduce the pain. Also, if several temporary blocks with local anesthetic are ineffective, a more permanent block with cryolysis may be necessary.  This will be discussed with you should this be the case.  If you have any questions, please call [phone] [facility]    GENERAL RISKS AND COMPLICATIONS  What are the risk, side effects and possible complications? Generally speaking, most procedures are safe.  However, with any procedure there are risks, side effects, and the possibility of complications.  The risks and complications are dependent upon the sites that are lesioned, or the type of nerve block to be performed.  The closer the procedure is to the spine, the more serious the risks are.  Great care is taken when placing the radio frequency needles, block needles or lesioning probes, but sometimes complications can occur. 1. Infection: Any time there is an injection through the skin, there is a risk of infection.  This is why sterile conditions are used for these blocks.  There are four possible types of infection. 1. Localized skin infection. 2. Central Nervous System Infection-This can be in the form of Meningitis, which can be deadly. 3. Epidural Infections-This can be in the form of an epidural abscess, which  can cause pressure inside of the spine, causing compression of the spinal cord with subsequent paralysis. This would require an emergency surgery to decompress, and there are no guarantees that the patient would recover from the paralysis. 4. Discitis-This is an infection of the intervertebral discs.  It occurs in about 1% of discography procedures.  It is difficult to treat and it may lead to surgery.        2. Pain: the needles have to go through skin and soft tissues, will cause soreness.       3. Damage to internal structures:  The nerves to be lesioned may be near blood vessels or    other nerves which can be potentially damaged.       4. Bleeding: Bleeding is more common if the patient is taking blood thinners such as  aspirin, Coumadin, Ticiid, Plavix, etc., or if he/she have some genetic predisposition  such as hemophilia. Bleeding into the spinal canal can cause compression of the spinal  cord with subsequent paralysis.  This would require an emergency surgery to  decompress and there are no guarantees that the patient would recover from the  paralysis.       5. Pneumothorax:  Puncturing of a lung is a possibility, every time a needle is introduced in  the area of the chest or upper back.  Pneumothorax refers to free  air around the  collapsed lung(s), inside of the thoracic cavity (chest cavity).  Another two possible  complications related to a similar event would include: Hemothorax and Chylothorax.   These are variations of the Pneumothorax, where instead of air around the collapsed  lung(s), you may have blood or chyle, respectively.       6. Spinal headaches: They may occur with any procedures in the area of the spine.       7. Persistent CSF (Cerebro-Spinal Fluid) leakage: This is a rare problem, but may occur  with prolonged intrathecal or epidural catheters either due to the formation of a fistulous  track or a dural tear.       8. Nerve damage: By working so close to the spinal cord,  there is always a possibility of  nerve damage, which could be as serious as a permanent spinal cord injury with  paralysis.       9. Death:  Although rare, severe deadly allergic reactions known as "Anaphylactic  reaction" can occur to any of the medications used.      10. Worsening of the symptoms:  We can always make thing worse.  What are the chances of something like this happening? Chances of any of this occuring are extremely low.  By statistics, you have more of a chance of getting killed in a motor vehicle accident: while driving to the hospital than any of the above occurring .  Nevertheless, you should be aware that they are possibilities.  In general, it is similar to taking a shower.  Everybody knows that you can slip, hit your head and get killed.  Does that mean that you should not shower again?  Nevertheless always keep in mind that statistics do not mean anything if you happen to be on the wrong side of them.  Even if a procedure has a 1 (one) in a 1,000,000 (million) chance of going wrong, it you happen to be that one..Also, keep in mind that by statistics, you have more of a chance of having something go wrong when taking medications.  Who should not have this procedure? If you are on a blood thinning medication (e.g. Coumadin, Plavix, see list of "Blood Thinners"), or if you have an active infection going on, you should not have the procedure.  If you are taking any blood thinners, please inform your physician.  How should I prepare for this procedure?  Do not eat or drink anything at least six hours prior to the procedure.  Bring a driver with you .  It cannot be a taxi.  Come accompanied by an adult that can drive you back, and that is strong enough to help you if your legs get weak or numb from the local anesthetic.  Take all of your medicines the morning of the procedure with just enough water to swallow them.  If you have diabetes, make sure that you are scheduled to have  your procedure done first thing in the morning, whenever possible.  If you have diabetes, take only half of your insulin dose and notify our nurse that you have done so as soon as you arrive at the clinic.  If you are diabetic, but only take blood sugar pills (oral hypoglycemic), then do not take them on the morning of your procedure.  You may take them after you have had the procedure.  Do not take aspirin or any aspirin-containing medications, at least eleven (11) days prior to the procedure.  They may prolong bleeding.  Wear loose fitting clothing that may be easy to take off and that you would not mind if it got stained with Betadine or blood.  Do not wear any jewelry or perfume  Remove any nail coloring.  It will interfere with some of our monitoring equipment.  NOTE: Remember that this is not meant to be interpreted as a complete list of all possible complications.  Unforeseen problems may occur.  BLOOD THINNERS The following drugs contain aspirin or other products, which can cause increased bleeding during surgery and should not be taken for 2 weeks prior to and 1 week after surgery.  If you should need take something for relief of minor pain, you may take acetaminophen which is found in Tylenol,m Datril, Anacin-3 and Panadol. It is not blood thinner. The products listed below are.  Do not take any of the products listed below in addition to any listed on your instruction sheet.  A.P.C or A.P.C with Codeine Codeine Phosphate Capsules #3 Ibuprofen Ridaura  ABC compound Congesprin Imuran rimadil  Advil Cope Indocin Robaxisal  Alka-Seltzer Effervescent Pain Reliever and Antacid Coricidin or Coricidin-D  Indomethacin Rufen  Alka-Seltzer plus Cold Medicine Cosprin Ketoprofen S-A-C Tablets  Anacin Analgesic Tablets or Capsules Coumadin Korlgesic Salflex  Anacin Extra Strength Analgesic tablets or capsules CP-2 Tablets Lanoril Salicylate  Anaprox Cuprimine Capsules Levenox Salocol   Anexsia-D Dalteparin Magan Salsalate  Anodynos Darvon compound Magnesium Salicylate Sine-off  Ansaid Dasin Capsules Magsal Sodium Salicylate  Anturane Depen Capsules Marnal Soma  APF Arthritis pain formula Dewitt's Pills Measurin Stanback  Argesic Dia-Gesic Meclofenamic Sulfinpyrazone  Arthritis Bayer Timed Release Aspirin Diclofenac Meclomen Sulindac  Arthritis pain formula Anacin Dicumarol Medipren Supac  Analgesic (Safety coated) Arthralgen Diffunasal Mefanamic Suprofen  Arthritis Strength Bufferin Dihydrocodeine Mepro Compound Suprol  Arthropan liquid Dopirydamole Methcarbomol with Aspirin Synalgos  ASA tablets/Enseals Disalcid Micrainin Tagament  Ascriptin Doan's Midol Talwin  Ascriptin A/D Dolene Mobidin Tanderil  Ascriptin Extra Strength Dolobid Moblgesic Ticlid  Ascriptin with Codeine Doloprin or Doloprin with Codeine Momentum Tolectin  Asperbuf Duoprin Mono-gesic Trendar  Aspergum Duradyne Motrin or Motrin IB Triminicin  Aspirin plain, buffered or enteric coated Durasal Myochrisine Trigesic  Aspirin Suppositories Easprin Nalfon Trillsate  Aspirin with Codeine Ecotrin Regular or Extra Strength Naprosyn Uracel  Atromid-S Efficin Naproxen Ursinus  Auranofin Capsules Elmiron Neocylate Vanquish  Axotal Emagrin Norgesic Verin  Azathioprine Empirin or Empirin with Codeine Normiflo Vitamin E  Azolid Emprazil Nuprin Voltaren  Bayer Aspirin plain, buffered or children's or timed BC Tablets or powders Encaprin Orgaran Warfarin Sodium  Buff-a-Comp Enoxaparin Orudis Zorpin  Buff-a-Comp with Codeine Equegesic Os-Cal-Gesic   Buffaprin Excedrin plain, buffered or Extra Strength Oxalid   Bufferin Arthritis Strength Feldene Oxphenbutazone   Bufferin plain or Extra Strength Feldene Capsules Oxycodone with Aspirin   Bufferin with Codeine Fenoprofen Fenoprofen Pabalate or Pabalate-SF   Buffets II Flogesic Panagesic   Buffinol plain or Extra Strength Florinal or Florinal with Codeine  Panwarfarin   Buf-Tabs Flurbiprofen Penicillamine   Butalbital Compound Four-way cold tablets Penicillin   Butazolidin Fragmin Pepto-Bismol   Carbenicillin Geminisyn Percodan   Carna Arthritis Reliever Geopen Persantine   Carprofen Gold's salt Persistin   Chloramphenicol Goody's Phenylbutazone   Chloromycetin Haltrain Piroxlcam   Clmetidine heparin Plaquenil   Cllnoril Hyco-pap Ponstel   Clofibrate Hydroxy chloroquine Propoxyphen         Before stopping any of these medications, be sure to consult the physician who ordered them.  Some, such as Coumadin (  Warfarin) are ordered to prevent or treat serious conditions such as "deep thrombosis", "pumonary embolisms", and other heart problems.  The amount of time that you may need off of the medication may also vary with the medication and the reason for which you were taking it.  If you are taking any of these medications, please make sure you notify your pain physician before you undergo any procedures.
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
## 2016-04-01 ENCOUNTER — Encounter: Payer: Self-pay | Admitting: Pain Medicine

## 2016-04-01 ENCOUNTER — Ambulatory Visit: Payer: Medicare Other | Attending: Pain Medicine | Admitting: Pain Medicine

## 2016-04-01 VITALS — BP 133/90 | HR 60 | Temp 97.3°F | Resp 22 | Ht 65.0 in | Wt 160.0 lb

## 2016-04-01 DIAGNOSIS — M533 Sacrococcygeal disorders, not elsewhere classified: Secondary | ICD-10-CM

## 2016-04-01 DIAGNOSIS — M7060 Trochanteric bursitis, unspecified hip: Secondary | ICD-10-CM

## 2016-04-01 DIAGNOSIS — M940 Chondrocostal junction syndrome [Tietze]: Secondary | ICD-10-CM

## 2016-04-01 DIAGNOSIS — M5416 Radiculopathy, lumbar region: Secondary | ICD-10-CM

## 2016-04-01 DIAGNOSIS — M546 Pain in thoracic spine: Secondary | ICD-10-CM | POA: Insufficient documentation

## 2016-04-01 DIAGNOSIS — G588 Other specified mononeuropathies: Secondary | ICD-10-CM | POA: Insufficient documentation

## 2016-04-01 DIAGNOSIS — M549 Dorsalgia, unspecified: Secondary | ICD-10-CM | POA: Diagnosis present

## 2016-04-01 DIAGNOSIS — M961 Postlaminectomy syndrome, not elsewhere classified: Secondary | ICD-10-CM

## 2016-04-01 DIAGNOSIS — Z9889 Other specified postprocedural states: Secondary | ICD-10-CM

## 2016-04-01 DIAGNOSIS — M5136 Other intervertebral disc degeneration, lumbar region: Secondary | ICD-10-CM

## 2016-04-01 DIAGNOSIS — M48062 Spinal stenosis, lumbar region with neurogenic claudication: Secondary | ICD-10-CM

## 2016-04-01 DIAGNOSIS — M47816 Spondylosis without myelopathy or radiculopathy, lumbar region: Secondary | ICD-10-CM

## 2016-04-01 DIAGNOSIS — E1340 Other specified diabetes mellitus with diabetic neuropathy, unspecified: Secondary | ICD-10-CM

## 2016-04-01 MED ORDER — FENTANYL CITRATE (PF) 100 MCG/2ML IJ SOLN: 100.0000 ug | Freq: Once | INTRAMUSCULAR | Status: DC

## 2016-04-01 MED ORDER — ORPHENADRINE CITRATE 30 MG/ML IJ SOLN: 60.0000 mg | Freq: Once | INTRAMUSCULAR | Status: DC

## 2016-04-01 MED ORDER — MIDAZOLAM HCL 5 MG/5ML IJ SOLN: 5.0000 mg | Freq: Once | INTRAMUSCULAR | Status: DC

## 2016-04-01 MED ORDER — TRIAMCINOLONE ACETONIDE 40 MG/ML IJ SUSP
INTRAMUSCULAR | Status: AC
  Administered 2016-04-01: 08:00:00
  Filled 2016-04-01: qty 1

## 2016-04-01 MED ORDER — ORPHENADRINE CITRATE 30 MG/ML IJ SOLN
INTRAMUSCULAR | Status: AC
  Administered 2016-04-01: 08:00:00
  Filled 2016-04-01: qty 2

## 2016-04-01 MED ORDER — FENTANYL CITRATE (PF) 100 MCG/2ML IJ SOLN
INTRAMUSCULAR | Status: AC
  Administered 2016-04-01: 100 ug
  Filled 2016-04-01: qty 2

## 2016-04-01 MED ORDER — BUPIVACAINE HCL (PF) 0.5 % IJ SOLN
INTRAMUSCULAR | Status: AC
  Administered 2016-04-01: 08:00:00
  Filled 2016-04-01: qty 30

## 2016-04-01 MED ORDER — BUPIVACAINE HCL (PF) 0.5 % IJ SOLN: 30.0000 mL | Freq: Once | INTRAMUSCULAR | Status: DC

## 2016-04-01 MED ORDER — MIDAZOLAM HCL 5 MG/5ML IJ SOLN
INTRAMUSCULAR | Status: AC
  Administered 2016-04-01: 4 mg
  Filled 2016-04-01: qty 5

## 2016-04-01 MED ORDER — LACTATED RINGERS IV SOLN: 1000.0000 mL | INTRAVENOUS | Status: DC

## 2016-04-01 MED ORDER — TRIAMCINOLONE ACETONIDE 40 MG/ML IJ SUSP: 40.0000 mg | Freq: Once | INTRAMUSCULAR | Status: DC

## 2016-04-01 NOTE — Progress Notes (Signed)
Safety precautions to be maintained throughout the outpatient stay will include: orient to surroundings, keep bed in low position, maintain call bell within reach at all times, provide assistance with transfer out of bed and ambulation.  
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
## 2016-04-01 NOTE — Patient Instructions (Addendum)
PLAN   Continue present medication Lyrica and oxycodone   F/U PCP Dr. Lawerance BachBurns for evaliation of  BP and general medical  condition.   F/U surgical evaluation. Patient will follow-up with Dr.Cram   F/U neurological evaluation. May consider pending follow-up evaluations  May consider radiofrequency rhizolysis or intraspinal procedures pending response to present treatment and F/U evaluation.  Patient to call [facility] should patient have concerns prior to scheduled return appointmentGENERAL RISKS AND COMPLICATIONS  What are the risk, side effects and possible complications? Generally speaking, most procedures are safe.  However, with any procedure there are risks, side effects, and the possibility of complications.  The risks and complications are dependent upon the sites that are lesioned, or the type of nerve block to be performed.  The closer the procedure is to the spine, the more serious the risks are.  Great care is taken when placing the radio frequency needles, block needles or lesioning probes, but sometimes complications can occur. 1. Infection: Any time there is an injection through the skin, there is a risk of infection.  This is why sterile conditions are used for these blocks.  There are four possible types of infection. 1. Localized skin infection. 2. Central Nervous System Infection-This can be in the form of Meningitis, which can be deadly. 3. Epidural Infections-This can be in the form of an epidural abscess, which can cause pressure inside of the spine, causing compression of the spinal cord with subsequent paralysis. This would require an emergency surgery to decompress, and there are no guarantees that the patient would recover from the paralysis. 4. Discitis-This is an infection of the intervertebral discs.  It occurs in about 1% of discography procedures.  It is difficult to treat and it may lead to surgery.        2. Pain: the needles have to go through skin  and soft tissues, will cause soreness.       3. Damage to internal structures:  The nerves to be lesioned may be near blood vessels or    other nerves which can be potentially damaged.       4. Bleeding: Bleeding is more common if the patient is taking blood thinners such as  aspirin, Coumadin, Ticiid, Plavix, etc., or if he/she have some genetic predisposition  such as hemophilia. Bleeding into the spinal canal can cause compression of the spinal  cord with subsequent paralysis.  This would require an emergency surgery to  decompress and there are no guarantees that the patient would recover from the  paralysis.       5. Pneumothorax:  Puncturing of a lung is a possibility, every time a needle is introduced in  the area of the chest or upper back.  Pneumothorax refers to free air around the  collapsed lung(s), inside of the thoracic cavity (chest cavity).  Another two possible  complications related to a similar event would include: Hemothorax and Chylothorax.   These are variations of the Pneumothorax, where instead of air around the collapsed  lung(s), you may have blood or chyle, respectively.       6. Spinal headaches: They may occur with any procedures in the area of the spine.       7. Persistent CSF (Cerebro-Spinal Fluid) leakage: This is a rare problem, but may occur  with prolonged intrathecal or epidural catheters either due to the formation of a fistulous  track or a dural tear.       8. Nerve damage:  By working so close to the spinal cord, there is always a possibility of  nerve damage, which could be as serious as a permanent spinal cord injury with  paralysis.       9. Death:  Although rare, severe deadly allergic reactions known as "Anaphylactic  reaction" can occur to any of the medications used.      10. Worsening of the symptoms:  We can always make thing worse.  What are the chances of something like this happening? Chances of any of this occuring are extremely low.  By statistics,  you have more of a chance of getting killed in a motor vehicle accident: while driving to the hospital than any of the above occurring .  Nevertheless, you should be aware that they are possibilities.  In general, it is similar to taking a shower.  Everybody knows that you can slip, hit your head and get killed.  Does that mean that you should not shower again?  Nevertheless always keep in mind that statistics do not mean anything if you happen to be on the wrong side of them.  Even if a procedure has a 1 (one) in a 1,000,000 (million) chance of going wrong, it you happen to be that one..Also, keep in mind that by statistics, you have more of a chance of having something go wrong when taking medications.  Who should not have this procedure? If you are on a blood thinning medication (e.g. Coumadin, Plavix, see list of "Blood Thinners"), or if you have an active infection going on, you should not have the procedure.  If you are taking any blood thinners, please inform your physician.  How should I prepare for this procedure?  Do not eat or drink anything at least six hours prior to the procedure.  Bring a driver with you .  It cannot be a taxi.  Come accompanied by an adult that can drive you back, and that is strong enough to help you if your legs get weak or numb from the local anesthetic.  Take all of your medicines the morning of the procedure with just enough water to swallow them.  If you have diabetes, make sure that you are scheduled to have your procedure done first thing in the morning, whenever possible.  If you have diabetes, take only half of your insulin dose and notify our nurse that you have done so as soon as you arrive at the clinic.  If you are diabetic, but only take blood sugar pills (oral hypoglycemic), then do not take them on the morning of your procedure.  You may take them after you have had the procedure.  Do not take aspirin or any aspirin-containing medications, at  least eleven (11) days prior to the procedure.  They may prolong bleeding.  Wear loose fitting clothing that may be easy to take off and that you would not mind if it got stained with Betadine or blood.  Do not wear any jewelry or perfume  Remove any nail coloring.  It will interfere with some of our monitoring equipment.  NOTE: Remember that this is not meant to be interpreted as a complete list of all possible complications.  Unforeseen problems may occur.  BLOOD THINNERS The following drugs contain aspirin or other products, which can cause increased bleeding during surgery and should not be taken for 2 weeks prior to and 1 week after surgery.  If you should need take something for relief of minor pain, you may  take acetaminophen which is found in Tylenol,m Datril, Anacin-3 and Panadol. It is not blood thinner. The products listed below are.  Do not take any of the products listed below in addition to any listed on your instruction sheet.  A.P.C or A.P.C with Codeine Codeine Phosphate Capsules #3 Ibuprofen Ridaura  ABC compound Congesprin Imuran rimadil  Advil Cope Indocin Robaxisal  Alka-Seltzer Effervescent Pain Reliever and Antacid Coricidin or Coricidin-D  Indomethacin Rufen  Alka-Seltzer plus Cold Medicine Cosprin Ketoprofen S-A-C Tablets  Anacin Analgesic Tablets or Capsules Coumadin Korlgesic Salflex  Anacin Extra Strength Analgesic tablets or capsules CP-2 Tablets Lanoril Salicylate  Anaprox Cuprimine Capsules Levenox Salocol  Anexsia-D Dalteparin Magan Salsalate  Anodynos Darvon compound Magnesium Salicylate Sine-off  Ansaid Dasin Capsules Magsal Sodium Salicylate  Anturane Depen Capsules Marnal Soma  APF Arthritis pain formula Dewitt's Pills Measurin Stanback  Argesic Dia-Gesic Meclofenamic Sulfinpyrazone  Arthritis Bayer Timed Release Aspirin Diclofenac Meclomen Sulindac  Arthritis pain formula Anacin Dicumarol Medipren Supac  Analgesic (Safety coated) Arthralgen  Diffunasal Mefanamic Suprofen  Arthritis Strength Bufferin Dihydrocodeine Mepro Compound Suprol  Arthropan liquid Dopirydamole Methcarbomol with Aspirin Synalgos  ASA tablets/Enseals Disalcid Micrainin Tagament  Ascriptin Doan's Midol Talwin  Ascriptin A/D Dolene Mobidin Tanderil  Ascriptin Extra Strength Dolobid Moblgesic Ticlid  Ascriptin with Codeine Doloprin or Doloprin with Codeine Momentum Tolectin  Asperbuf Duoprin Mono-gesic Trendar  Aspergum Duradyne Motrin or Motrin IB Triminicin  Aspirin plain, buffered or enteric coated Durasal Myochrisine Trigesic  Aspirin Suppositories Easprin Nalfon Trillsate  Aspirin with Codeine Ecotrin Regular or Extra Strength Naprosyn Uracel  Atromid-S Efficin Naproxen Ursinus  Auranofin Capsules Elmiron Neocylate Vanquish  Axotal Emagrin Norgesic Verin  Azathioprine Empirin or Empirin with Codeine Normiflo Vitamin E  Azolid Emprazil Nuprin Voltaren  Bayer Aspirin plain, buffered or children's or timed BC Tablets or powders Encaprin Orgaran Warfarin Sodium  Buff-a-Comp Enoxaparin Orudis Zorpin  Buff-a-Comp with Codeine Equegesic Os-Cal-Gesic   Buffaprin Excedrin plain, buffered or Extra Strength Oxalid   Bufferin Arthritis Strength Feldene Oxphenbutazone   Bufferin plain or Extra Strength Feldene Capsules Oxycodone with Aspirin   Bufferin with Codeine Fenoprofen Fenoprofen Pabalate or Pabalate-SF   Buffets II Flogesic Panagesic   Buffinol plain or Extra Strength Florinal or Florinal with Codeine Panwarfarin   Buf-Tabs Flurbiprofen Penicillamine   Butalbital Compound Four-way cold tablets Penicillin   Butazolidin Fragmin Pepto-Bismol   Carbenicillin Geminisyn Percodan   Carna Arthritis Reliever Geopen Persantine   Carprofen Gold's salt Persistin   Chloramphenicol Goody's Phenylbutazone   Chloromycetin Haltrain Piroxlcam   Clmetidine heparin Plaquenil   Cllnoril Hyco-pap Ponstel   Clofibrate Hydroxy chloroquine Propoxyphen         Before  stopping any of these medications, be sure to consult the physician who ordered them.  Some, such as Coumadin (Warfarin) are ordered to prevent or treat serious conditions such as "deep thrombosis", "pumonary embolisms", and other heart problems.  The amount of time that you may need off of the medication may also vary with the medication and the reason for which you were taking it.  If you are taking any of these medications, please make sure you notify your pain physician before you undergo any procedures.

## 2016-04-02 ENCOUNTER — Telehealth: Payer: Self-pay | Admitting: Pain Medicine

## 2016-04-02 ENCOUNTER — Telehealth: Payer: Self-pay | Admitting: *Deleted

## 2016-04-02 NOTE — Telephone Encounter (Signed)
Had procedure on Monday 04-01-16 did Dr. Metta Clinesrisp call in antibiotic? Also would like to speak with nurse regarding procedure

## 2016-04-02 NOTE — Telephone Encounter (Signed)
Spoke with patient re; procedure on yesterday, verbalizes no complications or concerns.  

## 2016-04-02 NOTE — Telephone Encounter (Signed)
Patient informed that no antibiotic was prescribed. Patient also asking if ok to take Ibuprofen. Dr. Metta Clinesrisp advised patient should not take Ibuprofen.

## 2016-04-04 ENCOUNTER — Other Ambulatory Visit: Payer: Self-pay | Admitting: Pain Medicine

## 2016-04-09 ENCOUNTER — Other Ambulatory Visit: Payer: Self-pay | Admitting: *Deleted

## 2016-04-09 MED ORDER — METOPROLOL SUCCINATE ER 50 MG PO TB24: 50.0000 mg | ORAL_TABLET | Freq: Every evening | ORAL | Status: DC

## 2016-04-25 ENCOUNTER — Ambulatory Visit: Payer: Medicare Other | Attending: Pain Medicine | Admitting: Pain Medicine

## 2016-04-25 ENCOUNTER — Encounter: Payer: Self-pay | Admitting: Pain Medicine

## 2016-04-25 VITALS — BP 111/56 | HR 57 | Temp 98.8°F | Resp 16 | Ht 65.0 in | Wt 152.0 lb

## 2016-04-25 DIAGNOSIS — M79606 Pain in leg, unspecified: Secondary | ICD-10-CM | POA: Diagnosis present

## 2016-04-25 DIAGNOSIS — M533 Sacrococcygeal disorders, not elsewhere classified: Secondary | ICD-10-CM

## 2016-04-25 DIAGNOSIS — M48062 Spinal stenosis, lumbar region with neurogenic claudication: Secondary | ICD-10-CM

## 2016-04-25 DIAGNOSIS — G588 Other specified mononeuropathies: Secondary | ICD-10-CM | POA: Diagnosis not present

## 2016-04-25 DIAGNOSIS — M47896 Other spondylosis, lumbar region: Secondary | ICD-10-CM | POA: Diagnosis not present

## 2016-04-25 DIAGNOSIS — M5136 Other intervertebral disc degeneration, lumbar region: Secondary | ICD-10-CM

## 2016-04-25 DIAGNOSIS — M7060 Trochanteric bursitis, unspecified hip: Secondary | ICD-10-CM

## 2016-04-25 DIAGNOSIS — M160 Bilateral primary osteoarthritis of hip: Secondary | ICD-10-CM

## 2016-04-25 DIAGNOSIS — M47816 Spondylosis without myelopathy or radiculopathy, lumbar region: Secondary | ICD-10-CM

## 2016-04-25 DIAGNOSIS — M51369 Other intervertebral disc degeneration, lumbar region without mention of lumbar back pain or lower extremity pain: Secondary | ICD-10-CM

## 2016-04-25 DIAGNOSIS — M62838 Other muscle spasm: Secondary | ICD-10-CM | POA: Diagnosis not present

## 2016-04-25 DIAGNOSIS — M5116 Intervertebral disc disorders with radiculopathy, lumbar region: Secondary | ICD-10-CM | POA: Insufficient documentation

## 2016-04-25 DIAGNOSIS — M940 Chondrocostal junction syndrome [Tietze]: Secondary | ICD-10-CM

## 2016-04-25 DIAGNOSIS — Z9889 Other specified postprocedural states: Secondary | ICD-10-CM

## 2016-04-25 DIAGNOSIS — M5416 Radiculopathy, lumbar region: Secondary | ICD-10-CM

## 2016-04-25 DIAGNOSIS — M542 Cervicalgia: Secondary | ICD-10-CM | POA: Diagnosis present

## 2016-04-25 DIAGNOSIS — Z96642 Presence of left artificial hip joint: Secondary | ICD-10-CM | POA: Diagnosis not present

## 2016-04-25 DIAGNOSIS — M4806 Spinal stenosis, lumbar region: Secondary | ICD-10-CM | POA: Insufficient documentation

## 2016-04-25 DIAGNOSIS — M546 Pain in thoracic spine: Secondary | ICD-10-CM | POA: Diagnosis present

## 2016-04-25 DIAGNOSIS — E1340 Other specified diabetes mellitus with diabetic neuropathy, unspecified: Secondary | ICD-10-CM

## 2016-04-25 DIAGNOSIS — M961 Postlaminectomy syndrome, not elsewhere classified: Secondary | ICD-10-CM

## 2016-04-25 MED ORDER — OXYCODONE HCL 5 MG PO TABS: ORAL_TABLET | ORAL | Status: DC

## 2016-04-25 MED ORDER — PREGABALIN 25 MG PO CAPS: ORAL_CAPSULE | ORAL | Status: DC

## 2016-04-25 NOTE — Progress Notes (Signed)
   Subjective:    Patient ID: Yvette Collier, female    DOB: 1941-04-17, 75 y.o.   MRN: [id number]  HPI    Review of Systems     Objective:   Physical Exam        Assessment & Plan:

## 2016-04-25 NOTE — Patient Instructions (Addendum)
PLAN   Continue present medication Lyrica and oxycodone   Obturator and femoral nerve blocks to be performed at time of return appointment  F/U PCP Dr. Lawerance BachBurns for evaliation of  BP GI condition and general medical  condition. Also discuss with Dr. Lawerance BachBurns increasing Lyrica to 1 pill twice per day if tolerated as we previously discussed . Caution the extra Lyrica can cause drowsiness confusion excessive sedation respiratory depression and other side effect as previously mentioned  F/U surgical evaluation. Patient will follow-up with Dr.Cram   F/U neurological evaluation. May consider PNCV/EMG studies and other studies pending follow-up evaluations  May consider radiofrequency rhizolysis or intraspinal procedures pending response to present treatment and F/U evaluation.  Patient to call Pain Management Center should patient have concerns prior to scheduled return appointment

## 2016-04-25 NOTE — Progress Notes (Signed)
Safety precautions to be maintained throughout the outpatient stay will include: orient to surroundings, keep bed in low position, maintain call bell within reach at all times, provide assistance with transfer out of bed and ambulation.  
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
## 2016-05-07 ENCOUNTER — Telehealth: Payer: Self-pay | Admitting: *Deleted

## 2016-05-07 NOTE — Telephone Encounter (Signed)
Pt called stating that she has sinus pressure and was wondering if it's safe to have a procedure tomorrow.she would like for someone to give her a call...thanks

## 2016-05-07 NOTE — Telephone Encounter (Signed)
Per Dr. Metta Clinesrisp---- ok for patient to come in for procedure. Patient states afebrile. Confirmed appointment.

## 2016-05-08 ENCOUNTER — Encounter: Payer: Self-pay | Admitting: Pain Medicine

## 2016-05-08 ENCOUNTER — Ambulatory Visit: Payer: Medicare Other | Attending: Pain Medicine | Admitting: Pain Medicine

## 2016-05-08 VITALS — BP 132/71 | HR 72 | Temp 98.7°F | Resp 15 | Ht 65.0 in | Wt 157.0 lb

## 2016-05-08 DIAGNOSIS — M160 Bilateral primary osteoarthritis of hip: Secondary | ICD-10-CM

## 2016-05-08 DIAGNOSIS — G588 Other specified mononeuropathies: Secondary | ICD-10-CM

## 2016-05-08 DIAGNOSIS — M940 Chondrocostal junction syndrome [Tietze]: Secondary | ICD-10-CM

## 2016-05-08 DIAGNOSIS — Z96642 Presence of left artificial hip joint: Secondary | ICD-10-CM | POA: Diagnosis not present

## 2016-05-08 DIAGNOSIS — M25552 Pain in left hip: Secondary | ICD-10-CM | POA: Insufficient documentation

## 2016-05-08 DIAGNOSIS — E1340 Other specified diabetes mellitus with diabetic neuropathy, unspecified: Secondary | ICD-10-CM

## 2016-05-08 DIAGNOSIS — M47816 Spondylosis without myelopathy or radiculopathy, lumbar region: Secondary | ICD-10-CM

## 2016-05-08 DIAGNOSIS — Z9889 Other specified postprocedural states: Secondary | ICD-10-CM

## 2016-05-08 DIAGNOSIS — M169 Osteoarthritis of hip, unspecified: Secondary | ICD-10-CM | POA: Insufficient documentation

## 2016-05-08 DIAGNOSIS — M533 Sacrococcygeal disorders, not elsewhere classified: Secondary | ICD-10-CM

## 2016-05-08 DIAGNOSIS — M7060 Trochanteric bursitis, unspecified hip: Secondary | ICD-10-CM

## 2016-05-08 DIAGNOSIS — M5136 Other intervertebral disc degeneration, lumbar region: Secondary | ICD-10-CM

## 2016-05-08 DIAGNOSIS — M51369 Other intervertebral disc degeneration, lumbar region without mention of lumbar back pain or lower extremity pain: Secondary | ICD-10-CM

## 2016-05-08 DIAGNOSIS — M48062 Spinal stenosis, lumbar region with neurogenic claudication: Secondary | ICD-10-CM

## 2016-05-08 DIAGNOSIS — M5416 Radiculopathy, lumbar region: Secondary | ICD-10-CM

## 2016-05-08 DIAGNOSIS — M961 Postlaminectomy syndrome, not elsewhere classified: Secondary | ICD-10-CM

## 2016-05-08 MED ORDER — CEFAZOLIN SODIUM 1 G IJ SOLR
INTRAMUSCULAR | Status: AC
  Administered 2016-05-08: 09:00:00
  Filled 2016-05-08: qty 10

## 2016-05-08 MED ORDER — CEFUROXIME AXETIL 250 MG PO TABS: 250.0000 mg | ORAL_TABLET | Freq: Two times a day (BID) | ORAL | Status: DC

## 2016-05-08 MED ORDER — CEFAZOLIN SODIUM 1-5 GM-% IV SOLN
1.0000 g | Freq: Once | INTRAVENOUS | Status: AC
  Administered 2016-05-08: 1 g via INTRAVENOUS

## 2016-05-08 MED ORDER — ORPHENADRINE CITRATE 30 MG/ML IJ SOLN
60.0000 mg | Freq: Once | INTRAMUSCULAR | Status: DC
  Filled 2016-05-08: qty 2

## 2016-05-08 MED ORDER — BUPIVACAINE HCL (PF) 0.25 % IJ SOLN
30.0000 mL | Freq: Once | INTRAMUSCULAR | Status: AC
  Administered 2016-05-08: 30 mL
  Filled 2016-05-08: qty 30

## 2016-05-08 MED ORDER — MIDAZOLAM HCL 5 MG/5ML IJ SOLN
5.0000 mg | Freq: Once | INTRAMUSCULAR | Status: AC
  Administered 2016-05-08: 2 mg via INTRAVENOUS
  Filled 2016-05-08: qty 5

## 2016-05-08 MED ORDER — LACTATED RINGERS IV SOLN: 1000.0000 mL | INTRAVENOUS | Status: DC

## 2016-05-08 MED ORDER — LIDOCAINE HCL (PF) 1 % IJ SOLN
10.0000 mL | Freq: Once | INTRAMUSCULAR | Status: DC
  Filled 2016-05-08: qty 10

## 2016-05-08 MED ORDER — TRIAMCINOLONE ACETONIDE 40 MG/ML IJ SUSP
40.0000 mg | Freq: Once | INTRAMUSCULAR | Status: DC
  Filled 2016-05-08: qty 1

## 2016-05-08 MED ORDER — FENTANYL CITRATE (PF) 100 MCG/2ML IJ SOLN
100.0000 ug | Freq: Once | INTRAMUSCULAR | Status: AC
  Administered 2016-05-08: 50 ug via INTRAVENOUS
  Filled 2016-05-08: qty 2

## 2016-05-08 NOTE — Progress Notes (Signed)
   Subjective:    Patient ID: Yvette RakersMary L Collier, female    DOB: 31-Dec-1940, 75 y.o.   MRN: [id number]  HPI                              FEMORAL AND OBTURATOR NERVE BLOCKS FOR LEFT HIP PAIN    The patient is a 75 -year-old female who returns to pain management for further evaluation and treatment of severely debilitating left hip pain.  Prior studies have revealed the patient to be with prior total hip replacement of the left hip.. Decision has been made to proceed with block of the articular branches of the femoral and obturator nerves to decrease the severity of patient's symptoms, minimize progression of patient's symptoms, and avoid the need for more involved treatment. The risks, benefits, and expectations of the procedure have been discussed with and explained to the patient who is with understanding and is in agreement to proceed with block of the articular branches of the femoral and obturator nerves to treat pain of the hip. All are in agreement with suggested treatment plan.      DESCRIPTION OF PROCEDURE: Femoral And Obturator Articular Branch Nerve Blocks    The patient was taken to fluoroscopy suite and assumed the supine position. EKG, blood pressure, pulse, pulse oximetry, and capnograph monitors were all in place. IV Versed and IV fentanyl conscious sedation was obtained. Betadine prep of proposed needle entry sites was accomplished.  Block Of The Articular Branch Of Femoral Nerve   Under fluoroscopic guidance a local anesthetic skin wheal of 1.5% preservative-free lidocaine was accomplished at the proposed needle entry site for needle entry for block of the articular branch of the femoral nerve. Under fluoroscopic guidance a 22-gauge needle was inserted at the 12:00 position of the acetabulum and slightly superior to the acetabular rim.  Following documentation of accurate needle placement 1 ml of 0.25% bupivacaine was injected for block of the articular branch of the femoral  nerve.  Block Of The Articular Branch Of Obturator Nerve  Under fluoroscopic guidance a local anesthetic skin wheal of 1.5% preservative-free lidocaine was accomplished at the proposed needle entry site for needle entry for block of the articular branch of the obturator nerve. Under fluoroscopic guidance a 22-gauge needle was inserted at the incisura of the acetabulum until contact of bone was accomplished.(The needle was placed in the region known as the teardrop formation).  Following confirmation of accurate needle placement for block of the articular branch of the obturator nerve, 1 ml of 0.25% bupivacaine was injected. the 22-gauge needle.    The patient tolerated the procedure well    PLAN  Continue present medication Lyrica and oxycodone  F/U PCP Dr. Lawerance BachBurns for evaliation of  BP and general medical  condition  F/U surgical evaluation. Has been addressed  F/U neurological evaluation. May consider additional studies as discussed  May consider radiofrequency rhizolysis or intraspinal procedures pending response to present treatment and F/U evaluation   Patient to call Pain Management Center should patient have concerns prior to scheduled return appointment.    Review of Systems     Objective:   Physical Exam        Assessment & Plan:
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
## 2016-05-08 NOTE — Progress Notes (Signed)
Safety precautions to be maintained throughout the outpatient stay will include: orient to surroundings, keep bed in low position, maintain call bell within reach at all times, provide assistance with transfer out of bed and ambulation.  

## 2016-05-09 ENCOUNTER — Telehealth: Payer: Self-pay

## 2016-05-09 NOTE — Telephone Encounter (Signed)
Denies any needs at this time- states didn't sleep well last night- Doing ice and heat- Pt also reading discharge instructions at the time of phone call

## 2016-05-21 ENCOUNTER — Ambulatory Visit: Payer: Medicare Other | Attending: Pain Medicine | Admitting: Pain Medicine

## 2016-05-21 ENCOUNTER — Encounter: Payer: Self-pay | Admitting: Pain Medicine

## 2016-05-21 VITALS — BP 128/67 | HR 61 | Temp 98.8°F | Resp 16 | Ht 65.0 in | Wt 157.0 lb

## 2016-05-21 DIAGNOSIS — M79606 Pain in leg, unspecified: Secondary | ICD-10-CM | POA: Diagnosis present

## 2016-05-21 DIAGNOSIS — M48062 Spinal stenosis, lumbar region with neurogenic claudication: Secondary | ICD-10-CM

## 2016-05-21 DIAGNOSIS — M545 Low back pain: Secondary | ICD-10-CM | POA: Diagnosis present

## 2016-05-21 DIAGNOSIS — M47816 Spondylosis without myelopathy or radiculopathy, lumbar region: Secondary | ICD-10-CM

## 2016-05-21 DIAGNOSIS — M4806 Spinal stenosis, lumbar region: Secondary | ICD-10-CM | POA: Diagnosis not present

## 2016-05-21 DIAGNOSIS — M940 Chondrocostal junction syndrome [Tietze]: Secondary | ICD-10-CM | POA: Diagnosis not present

## 2016-05-21 DIAGNOSIS — E1340 Other specified diabetes mellitus with diabetic neuropathy, unspecified: Secondary | ICD-10-CM

## 2016-05-21 DIAGNOSIS — Z96642 Presence of left artificial hip joint: Secondary | ICD-10-CM | POA: Insufficient documentation

## 2016-05-21 DIAGNOSIS — Z9889 Other specified postprocedural states: Secondary | ICD-10-CM

## 2016-05-21 DIAGNOSIS — M51369 Other intervertebral disc degeneration, lumbar region without mention of lumbar back pain or lower extremity pain: Secondary | ICD-10-CM

## 2016-05-21 DIAGNOSIS — G588 Other specified mononeuropathies: Secondary | ICD-10-CM | POA: Diagnosis not present

## 2016-05-21 DIAGNOSIS — M62838 Other muscle spasm: Secondary | ICD-10-CM | POA: Diagnosis not present

## 2016-05-21 DIAGNOSIS — M160 Bilateral primary osteoarthritis of hip: Secondary | ICD-10-CM | POA: Diagnosis not present

## 2016-05-21 DIAGNOSIS — M7060 Trochanteric bursitis, unspecified hip: Secondary | ICD-10-CM

## 2016-05-21 DIAGNOSIS — M961 Postlaminectomy syndrome, not elsewhere classified: Secondary | ICD-10-CM

## 2016-05-21 DIAGNOSIS — M533 Sacrococcygeal disorders, not elsewhere classified: Secondary | ICD-10-CM | POA: Diagnosis not present

## 2016-05-21 DIAGNOSIS — M5136 Other intervertebral disc degeneration, lumbar region: Secondary | ICD-10-CM

## 2016-05-21 DIAGNOSIS — M5416 Radiculopathy, lumbar region: Secondary | ICD-10-CM

## 2016-05-21 DIAGNOSIS — M5116 Intervertebral disc disorders with radiculopathy, lumbar region: Secondary | ICD-10-CM | POA: Insufficient documentation

## 2016-05-21 MED ORDER — PREGABALIN 25 MG PO CAPS: ORAL_CAPSULE | ORAL | Status: DC

## 2016-05-21 MED ORDER — OXYCODONE HCL 5 MG PO TABS: ORAL_TABLET | ORAL | Status: DC

## 2016-05-21 NOTE — Patient Instructions (Addendum)
PLAN   Continue present medication Lyrica and oxycodone   Block of nerves to the sacroiliac joint to be performed at time of return appointment  F/U PCP Dr. Lawerance Bach for evaliation of  BP GI condition and general medical  condition. Also discuss with Dr. Lawerance Bach increasing Lyrica to 1 pill twice per day if tolerated as we previously discussed . Caution the extra Lyrica can cause drowsiness confusion excessive sedation respiratory depression and other side effect as previously mentioned  F/U surgical evaluation. Patient will follow-up with Dr.Cram   F/U neurological evaluation. May consider PNCV/EMG studies and other studies pending follow-up evaluations  We will request radiofrequency rhizolysis of femoral and obturator nerves at this time for treatment of your left hip pain  Patient to call [facility] should patient have concerns prior to scheduled return appointmentSacroiliac (SI) Joint Injection Patient Information  Description: The sacroiliac joint connects the scrum (very low back and tailbone) to the ilium (a pelvic bone which also forms half of the hip joint).  Normally this joint experiences very little motion.  When this joint becomes inflamed or unstable low back and or hip and pelvis pain may result.  Injection of this joint with local anesthetics (numbing medicines) and steroids can provide diagnostic information and reduce pain.  This injection is performed with the aid of x-ray guidance into the tailbone area while you are lying on your stomach.   You may experience an electrical sensation down the leg while this is being done.  You may also experience numbness.  We also may ask if we are reproducing your normal pain during the injection.  Conditions which may be treated SI injection:   Low back, buttock, hip or leg pain  Preparation for the Injection:  1. Do not eat any solid food or dairy products within 8 hours of your appointment.  2. You may drink clear liquids  up to 3 hours before appointment.  Clear liquids include water, black coffee, juice or soda.  No milk or cream please. 3. You may take your regular medications, including pain medications with a sip of water before your appointment.  Diabetics should hold regular insulin (if take separately) and take 1/2 normal NPH dose the morning of the procedure.  Carry some sugar containing items with you to your appointment. 4. A driver must accompany you and be prepared to drive you home after your procedure. 5. Bring all of your current medications with you. 6. An IV may be inserted and sedation may be given at the discretion of the physician. 7. A blood pressure cuff, EKG and other monitors will often be applied during the procedure.  Some patients may need to have extra oxygen administered for a short period.  8. You will be asked to provide medical information, including your allergies, prior to the procedure.  We must know immediately if you are taking blood thinners (like Coumadin/Warfarin) or if you are allergic to IV iodine contrast (dye).  We must know if you could possible be pregnant.  Possible side effects:   Bleeding from needle site  Infection (rare, may require surgery)  Nerve injury (rare)  Numbness & tingling (temporary)  A brief convulsion or seizure  Light-headedness (temporary)  Pain at injection site (several days)  Decreased blood pressure (temporary)  Weakness in the leg (temporary)   Call if you experience:   New onset weakness or numbness of an extremity below the injection site that last more than 8 hours.  Hives or  difficulty breathing ( go to the emergency room)  Inflammation or drainage at the injection site  Any new symptoms which are concerning to you  Please note:  Although the local anesthetic injected can often make your back/ hip/ buttock/ leg feel good for several hours after the injections, the pain will likely return.  It takes 3-7 days for  steroids to work in the sacroiliac area.  You may not notice any pain relief for at least that one week.  If effective, we will often do a series of three injections spaced 3-6 weeks apart to maximally decrease your pain.  After the initial series, we generally will wait some months before a repeat injection of the same type.  If you have any questions, please call [phone] [facility]

## 2016-05-21 NOTE — Progress Notes (Signed)
Safety precautions to be maintained throughout the outpatient stay will include: orient to surroundings, keep bed in low position, maintain call bell within reach at all times, provide assistance with transfer out of bed and ambulation.  
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
## 2016-05-24 ENCOUNTER — Encounter: Payer: Self-pay | Admitting: Emergency Medicine

## 2016-05-24 ENCOUNTER — Observation Stay
Admission: EM | Admit: 2016-05-24 | Discharge: 2016-05-26 | Disposition: A | Discharge status: Home / Self Care | Payer: Medicare Other | Attending: Specialist | Admitting: Specialist

## 2016-05-24 DIAGNOSIS — I480 Paroxysmal atrial fibrillation: Secondary | ICD-10-CM | POA: Insufficient documentation

## 2016-05-24 DIAGNOSIS — Z90710 Acquired absence of both cervix and uterus: Secondary | ICD-10-CM | POA: Insufficient documentation

## 2016-05-24 DIAGNOSIS — J449 Chronic obstructive pulmonary disease, unspecified: Secondary | ICD-10-CM | POA: Diagnosis not present

## 2016-05-24 DIAGNOSIS — Z888 Allergy status to other drugs, medicaments and biological substances status: Secondary | ICD-10-CM | POA: Diagnosis not present

## 2016-05-24 DIAGNOSIS — Z8673 Personal history of transient ischemic attack (TIA), and cerebral infarction without residual deficits: Secondary | ICD-10-CM | POA: Insufficient documentation

## 2016-05-24 DIAGNOSIS — K922 Gastrointestinal hemorrhage, unspecified: Secondary | ICD-10-CM | POA: Diagnosis present

## 2016-05-24 DIAGNOSIS — M961 Postlaminectomy syndrome, not elsewhere classified: Secondary | ICD-10-CM | POA: Diagnosis not present

## 2016-05-24 DIAGNOSIS — E1140 Type 2 diabetes mellitus with diabetic neuropathy, unspecified: Secondary | ICD-10-CM | POA: Insufficient documentation

## 2016-05-24 DIAGNOSIS — K559 Vascular disorder of intestine, unspecified: Secondary | ICD-10-CM | POA: Insufficient documentation

## 2016-05-24 DIAGNOSIS — K625 Hemorrhage of anus and rectum: Secondary | ICD-10-CM

## 2016-05-24 DIAGNOSIS — I119 Hypertensive heart disease without heart failure: Secondary | ICD-10-CM | POA: Insufficient documentation

## 2016-05-24 DIAGNOSIS — Z794 Long term (current) use of insulin: Secondary | ICD-10-CM | POA: Insufficient documentation

## 2016-05-24 DIAGNOSIS — Z882 Allergy status to sulfonamides status: Secondary | ICD-10-CM | POA: Insufficient documentation

## 2016-05-24 DIAGNOSIS — E785 Hyperlipidemia, unspecified: Secondary | ICD-10-CM | POA: Diagnosis not present

## 2016-05-24 DIAGNOSIS — J45909 Unspecified asthma, uncomplicated: Secondary | ICD-10-CM | POA: Diagnosis not present

## 2016-05-24 DIAGNOSIS — M4806 Spinal stenosis, lumbar region: Secondary | ICD-10-CM | POA: Diagnosis not present

## 2016-05-24 DIAGNOSIS — K648 Other hemorrhoids: Secondary | ICD-10-CM | POA: Insufficient documentation

## 2016-05-24 DIAGNOSIS — M5116 Intervertebral disc disorders with radiculopathy, lumbar region: Secondary | ICD-10-CM | POA: Diagnosis not present

## 2016-05-24 DIAGNOSIS — Z9049 Acquired absence of other specified parts of digestive tract: Secondary | ICD-10-CM | POA: Insufficient documentation

## 2016-05-24 DIAGNOSIS — Z87891 Personal history of nicotine dependence: Secondary | ICD-10-CM | POA: Insufficient documentation

## 2016-05-24 DIAGNOSIS — R109 Unspecified abdominal pain: Secondary | ICD-10-CM

## 2016-05-24 DIAGNOSIS — I2510 Atherosclerotic heart disease of native coronary artery without angina pectoris: Secondary | ICD-10-CM | POA: Insufficient documentation

## 2016-05-24 DIAGNOSIS — K589 Irritable bowel syndrome without diarrhea: Secondary | ICD-10-CM | POA: Diagnosis not present

## 2016-05-24 DIAGNOSIS — K219 Gastro-esophageal reflux disease without esophagitis: Secondary | ICD-10-CM | POA: Diagnosis not present

## 2016-05-24 DIAGNOSIS — Z8719 Personal history of other diseases of the digestive system: Secondary | ICD-10-CM | POA: Diagnosis not present

## 2016-05-24 DIAGNOSIS — Z7982 Long term (current) use of aspirin: Secondary | ICD-10-CM | POA: Diagnosis not present

## 2016-05-24 DIAGNOSIS — Z86010 Personal history of colonic polyps: Secondary | ICD-10-CM | POA: Insufficient documentation

## 2016-05-24 DIAGNOSIS — Z91041 Radiographic dye allergy status: Secondary | ICD-10-CM | POA: Diagnosis not present

## 2016-05-24 DIAGNOSIS — M1990 Unspecified osteoarthritis, unspecified site: Secondary | ICD-10-CM | POA: Diagnosis not present

## 2016-05-24 DIAGNOSIS — Z8249 Family history of ischemic heart disease and other diseases of the circulatory system: Secondary | ICD-10-CM | POA: Diagnosis not present

## 2016-05-24 DIAGNOSIS — Z96642 Presence of left artificial hip joint: Secondary | ICD-10-CM | POA: Insufficient documentation

## 2016-05-24 DIAGNOSIS — E118 Type 2 diabetes mellitus with unspecified complications: Secondary | ICD-10-CM

## 2016-05-24 DIAGNOSIS — G8929 Other chronic pain: Secondary | ICD-10-CM | POA: Diagnosis not present

## 2016-05-24 LAB — CBC
HCT: 35.6 % (ref 35.0–47.0)
HEMOGLOBIN: 11.6 g/dL — AB (ref 12.0–16.0)
MCH: 29.7 pg (ref 26.0–34.0)
MCHC: 32.6 g/dL (ref 32.0–36.0)
MCV: 91.1 fL (ref 80.0–100.0)
Platelets: 315 10*3/uL (ref 150–440)
RBC: 3.9 MIL/uL (ref 3.80–5.20)
RDW: 13.2 % (ref 11.5–14.5)
WBC: 7.3 10*3/uL (ref 3.6–11.0)

## 2016-05-24 LAB — COMPREHENSIVE METABOLIC PANEL
ALK PHOS: 119 U/L (ref 38–126)
ALT: 13 U/L — ABNORMAL LOW (ref 14–54)
ANION GAP: 7 (ref 5–15)
AST: 19 U/L (ref 15–41)
Albumin: 4.4 g/dL (ref 3.5–5.0)
BUN: 22 mg/dL — ABNORMAL HIGH (ref 6–20)
CALCIUM: 9.5 mg/dL (ref 8.9–10.3)
CHLORIDE: 108 mmol/L (ref 101–111)
CO2: 26 mmol/L (ref 22–32)
CREATININE: 1.24 mg/dL — AB (ref 0.44–1.00)
GFR, EST AFRICAN AMERICAN: 48 mL/min — AB (ref >60)
GFR, EST NON AFRICAN AMERICAN: 41 mL/min — AB (ref >60)
Glucose, Bld: 146 mg/dL — ABNORMAL HIGH (ref 65–99)
Potassium: 3.6 mmol/L (ref 3.5–5.1)
SODIUM: 141 mmol/L (ref 135–145)
Total Bilirubin: 0.4 mg/dL (ref 0.3–1.2)
Total Protein: 7.8 g/dL (ref 6.5–8.1)

## 2016-05-24 LAB — TYPE AND SCREEN
ABO/RH(D): O POS
Antibody Screen: NEGATIVE

## 2016-05-24 LAB — GLUCOSE, CAPILLARY: Glucose-Capillary: 73 mg/dL (ref 65–99)

## 2016-05-24 MED ORDER — RISAQUAD PO CAPS
1.0000 | ORAL_CAPSULE | Freq: Every day | ORAL | Status: DC
  Administered 2016-05-24 – 2016-05-26 (×3): 1 via ORAL
  Filled 2016-05-24 (×3): qty 1

## 2016-05-24 MED ORDER — INSULIN ASPART 100 UNIT/ML ~~LOC~~ SOLN
0.0000 [IU] | Freq: Three times a day (TID) | SUBCUTANEOUS | Status: DC
  Administered 2016-05-25: 1 [IU] via SUBCUTANEOUS
  Administered 2016-05-25 (×2): 2 [IU] via SUBCUTANEOUS
  Filled 2016-05-24 (×2): qty 2
  Filled 2016-05-24: qty 1

## 2016-05-24 MED ORDER — FAMOTIDINE IN NACL 20-0.9 MG/50ML-% IV SOLN
20.0000 mg | Freq: Two times a day (BID) | INTRAVENOUS | Status: DC
  Administered 2016-05-24 – 2016-05-25 (×2): 20 mg via INTRAVENOUS
  Filled 2016-05-24 (×5): qty 50

## 2016-05-24 MED ORDER — OXYCODONE HCL 5 MG PO TABS
5.0000 mg | ORAL_TABLET | Freq: Three times a day (TID) | ORAL | Status: DC | PRN
  Administered 2016-05-25: 5 mg via ORAL
  Filled 2016-05-24: qty 1

## 2016-05-24 MED ORDER — CHOLESTYRAMINE LIGHT 4 G PO PACK
2.0000 g | PACK | Freq: Two times a day (BID) | ORAL | Status: DC
  Administered 2016-05-24 – 2016-05-26 (×3): 2 g via ORAL
  Filled 2016-05-24 (×4): qty 1

## 2016-05-24 MED ORDER — MORPHINE SULFATE (PF) 2 MG/ML IV SOLN: 2.0000 mg | INTRAVENOUS | Status: DC | PRN

## 2016-05-24 MED ORDER — PREGABALIN 25 MG PO CAPS
25.0000 mg | ORAL_CAPSULE | Freq: Two times a day (BID) | ORAL | Status: DC
  Administered 2016-05-24 – 2016-05-26 (×4): 25 mg via ORAL
  Filled 2016-05-24 (×4): qty 1

## 2016-05-24 MED ORDER — SODIUM CHLORIDE 0.9 % IV SOLN
INTRAVENOUS | Status: DC
  Administered 2016-05-24 – 2016-05-25 (×2): via INTRAVENOUS

## 2016-05-24 MED ORDER — DIPHENHYDRAMINE HCL 25 MG PO CAPS: 50.0000 mg | ORAL_CAPSULE | Freq: Once | ORAL | Status: DC

## 2016-05-24 MED ORDER — DILTIAZEM HCL ER COATED BEADS 120 MG PO CP24
120.0000 mg | ORAL_CAPSULE | Freq: Every day | ORAL | Status: DC
  Administered 2016-05-25 – 2016-05-26 (×2): 120 mg via ORAL
  Filled 2016-05-24 (×2): qty 1

## 2016-05-24 MED ORDER — PREDNISONE 20 MG PO TABS
50.0000 mg | ORAL_TABLET | Freq: Four times a day (QID) | ORAL | Status: AC
  Administered 2016-05-24 – 2016-05-25 (×3): 50 mg via ORAL
  Filled 2016-05-24 (×3): qty 2

## 2016-05-24 MED ORDER — SIMVASTATIN 20 MG PO TABS
10.0000 mg | ORAL_TABLET | Freq: Every evening | ORAL | Status: DC
  Administered 2016-05-24 – 2016-05-25 (×2): 10 mg via ORAL
  Filled 2016-05-24 (×2): qty 1

## 2016-05-24 MED ORDER — ONDANSETRON HCL 4 MG/2ML IJ SOLN: 4.0000 mg | Freq: Four times a day (QID) | INTRAMUSCULAR | Status: DC | PRN

## 2016-05-24 NOTE — ED Provider Notes (Signed)
[facility] Emergency Department Provider Note  ____________________________________________  Time seen: Approximately 5:56 PM  I have reviewed the triage vital signs and the nursing notes.   HISTORY  Chief Complaint Rectal Bleeding and Abdominal Pain    HPI Yvette Collier is a 75 y.o. female with a history of chronic pain and a history in 2015 of bright red blood per rectum thought to be secondary to ischemic colitis.  Her evaluation was somewhat limited at the time because she has a reported allergy to IV contrast so the imaging was without contrast.  She had extensive consultations by Dr. Mechele Collin, Dr. Servando Snare, Dr. Gilda Crease, and Dr. Michela Pitcher.  She ended up being managed nonsurgically and has not had a problem since that visit.  She reports today by EMS for evaluation of acute on chronic abdominal pain she says started today, is sharp, severe, and feels like pressure in her lower abdomen.  Movement makes it worse and nothing makes it better.  She also noticed bright red blood per rectum when she went to the bathroom.  The blood was mixed all in with the stool.  He reports that the same thing happened 2 years ago and she ended up almost needing a blood transfusion because she waited all night before coming in for evaluation.  She does not take any blood thinners.  She denies fever/chills, chest pain, shortness of breath, nausea, vomiting, diarrhea, dysuria.   Past Medical History  Diagnosis Date  . GERD (gastroesophageal reflux disease)   . Asthma   . Diabetes mellitus without complication (HCC)   . IBS (irritable bowel syndrome)   . History of colon polyps   . Hypertensive heart disease   . Osteoarthritis   . Chronic lower back pain   . Ischemic colitis (HCC)   . Gastrointestinal bleed   . COPD (chronic obstructive pulmonary disease) (HCC)   . 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 Non-obstructive Coronary Artery Disease     a. 05/2009 Cath  [facility]): minimal nonobs dzs.  Marland Kitchen PAF (paroxysmal atrial fibrillation) (HCC)     a. 09/2014 during GI illness;  b. CHA2DS2VASc = 5 (not currently on OAC 2/2 h/o GIB/ischemic colitis); c. 09/2014 Echo: EF 60-65%, imparied relaxation, nl RV size/fxn.    Patient Active Problem List   Diagnosis Date Noted  . Degenerative joint disease (DJD) of hip 05/08/2016  . 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 Hypertensive heart disease   . Hemorrhage of gastrointestinal tract 11/13/2015  . Lumbosacral facet joint syndrome 07/26/2015  . Angioedema 07/13/2015  . DM2 (diabetes mellitus, type 2) (HCC) 07/13/2015  . Status post lumbar laminectomy 06/19/2015  . Lumbar radiculopathy 06/19/2015  . DDD (degenerative disc disease), lumbar 05/11/2015  . Facet syndrome, lumbar 05/11/2015  . Lumbar post-laminectomy syndrome 05/11/2015  . Sacroiliac joint disease 05/11/2015  . 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 Neuropathy due to secondary diabetes (HCC) 05/11/2015  . Essential hypertension 01/03/2015  . CAD (coronary artery disease)   . PAF (paroxysmal atrial fibrillation) (HCC) 10/20/2014  . Ischemic colitis (HCC) 10/20/2014  . Diabetes mellitus type 2 with complications (HCC) 10/20/2014  . COPD (chronic obstructive pulmonary disease) (HCC) 10/20/2014  . Hyperlipidemia 10/20/2014  . GERD (gastroesophageal reflux disease) 10/20/2014  . Internal hemorrhoid, bleeding 07/14/2013    Past Surgical History  Procedure Laterality Date  . Foot surgery Right   . Stomach surgery    . Abdominal hysterectomy    .  Hemorrhoid banding    . Left total hip arthroplasty  2012  . Back surgery      x 3, last 2004.  Marland Kitchen Cholecystectomy    . Appendectomy    . Tonsillectomy      Current Outpatient Rx  Name  Route  Sig  Dispense  Refill  . aspirin EC 81 MG tablet   Oral   Take 81 mg by mouth daily.         . cetirizine (ZYRTEC) 10 MG tablet   Oral   Take 10 mg by mouth daily.          Marland Kitchen dexlansoprazole (DEXILANT) 60 MG capsule   Oral   Take 1 capsule (60 mg total) by mouth daily.   30 capsule   6   . dicyclomine (BENTYL) 20 MG tablet   Oral   Take 20 mg by mouth 2 (two) times daily.          Marland Kitchen diltiazem (CARDIZEM CD) 120 MG 24 hr capsule   Oral   Take 1 capsule (120 mg total) by mouth daily after breakfast.   90 capsule   3   . glipiZIDE (GLUCOTROL XL) 2.5 MG 24 hr tablet   Oral   Take 2.5 mg by mouth daily with breakfast.         . losartan (COZAAR) 25 MG tablet   Oral   Take 25 mg by mouth daily.          . metoprolol succinate (TOPROL-XL) 50 MG 24 hr tablet   Oral   Take 1 tablet (50 mg total) by mouth every evening. Take with or immediately following a meal.   90 tablet   3   . oxyCODONE (OXY IR/ROXICODONE) 5 MG immediate release tablet   Oral   Take 5 mg by mouth 3 (three) times daily as needed for severe pain.         . pregabalin (LYRICA) 25 MG capsule   Oral   Take 25 mg by mouth 2 (two) times daily.         . promethazine (PHENERGAN) 12.5 MG tablet   Oral   Take 1 tablet (12.5 mg total) by mouth every 6 (six) hours as needed for nausea or vomiting.   30 tablet   2   . ranitidine (ZANTAC) 150 MG tablet   Oral   Take 1 tablet (150 mg total) by mouth 2 (two) times daily.   60 tablet   3   . simvastatin (ZOCOR) 10 MG tablet   Oral   Take 10 mg by mouth every evening.          . cefUROXime (CEFTIN) 250 MG tablet   Oral   Take 1 tablet (250 mg total) by mouth 2 (two) times daily with a meal. Patient not taking: Reported on 05/21/2016   14 tablet   0   . cefUROXime (CEFTIN) 250 MG tablet   Oral   Take 1 tablet (250 mg total) by mouth 2 (two) times daily with a meal. Patient not taking: Reported on 02/28/2016   14 tablet   0   . cefUROXime (CEFTIN) 250 MG tablet   Oral   Take 1 tablet (250 mg total) by mouth 2 (two) times daily with a meal. Patient not taking: Reported on 05/21/2016   14 tablet   0   .  cholestyramine light (PREVALITE) 4 g packet   Oral   Take 1 packet (4 g total) by  mouth 2 (two) times daily. Mix one packet in 4 or more ounces of your favorite beverage and drink 1/2 twice a day (not within 2 hours of any of your other medications)   60 packet   5     Allergies Effexor; Ivp dye; Lisinopril; and Sulfa antibiotics  Family History  Problem Relation Age of Onset  . Heart attack Mother   . Hypertension Mother   . Heart disease Father   . Hypertension Father   . Hypertension Brother   . Hypertension Maternal Aunt     Social History Social History  Substance Use Topics  . Smoking status: Former Smoker -- 1.00 packs/day for 1 years  . Smokeless tobacco: Never Used  . Alcohol Use: No    Review of Systems Constitutional: No fever/chills Eyes: No visual changes. ENT: No sore throat. Cardiovascular: Denies chest pain. Respiratory: Denies shortness of breath. Gastrointestinal: Acute on chronic abdominal pain.  No nausea, no vomiting.  No diarrhea.  No constipation. BRBPR starting today Genitourinary: Negative for dysuria. Musculoskeletal: Negative for back pain. Skin: Negative for rash. Neurological: Negative for headaches, focal weakness or numbness.  10-point ROS otherwise negative.  ____________________________________________   PHYSICAL EXAM:  VITAL SIGNS: ED Triage Vitals  Enc Vitals Group     BP 05/24/16 1747 158/80 mmHg     Pulse Rate 05/24/16 1747 57     Resp 05/24/16 1747 16     Temp 05/24/16 1747 98.9 F (37.2 C)     Temp Source 05/24/16 1747 Oral     SpO2 05/24/16 1747 100 %     Weight 05/24/16 1747 155 lb (70.308 kg)     Height 05/24/16 1747 5\' 5"  (1.651 m)     Head Cir --      Peak Flow --      Pain Score 05/24/16 1748 7     Pain Loc --      Pain Edu? --      Excl. in GC? --     Constitutional: Alert and oriented. Well appearing and in no acute distress. Eyes: Conjunctivae are normal. PERRL. EOMI. Head: Atraumatic. Nose: No  congestion/rhinnorhea. Mouth/Throat: Mucous membranes are moist.  Oropharynx non-erythematous. Neck: No stridor.  No meningeal signs.   Cardiovascular: Normal rate, regular rhythm. Good peripheral circulation. Grossly normal heart sounds.   Respiratory: Normal respiratory effort.  No retractions. Lungs CTAB. Gastrointestinal: Soft with tenderness throughout lower abdomen.  No rebound/guarding.  Rectal is notable for bright red blood, strongly heme positive.   Musculoskeletal: No lower extremity tenderness nor edema. No gross deformities of extremities. Neurologic:  Normal speech and language. No gross focal neurologic deficits are appreciated.  Skin:  Skin is warm, dry and intact. No rash noted. Psychiatric: Mood and affect are normal. Speech and behavior are normal.  ____________________________________________   LABS (all labs ordered are listed, but only abnormal results are displayed)  Labs Reviewed  COMPREHENSIVE METABOLIC PANEL - Abnormal; Notable for the following:    Glucose, Bld 146 (*)    BUN 22 (*)    Creatinine, Ser 1.24 (*)    ALT 13 (*)    GFR calc non Af Amer 41 (*)    GFR calc Af Amer 48 (*)    All other components within normal limits  CBC - Abnormal; Notable for the following:    Hemoglobin 11.6 (*)    All other components within normal limits  TYPE AND SCREEN   ____________________________________________  EKG  None ____________________________________________  RADIOLOGY  No results found.  ____________________________________________   PROCEDURES  Procedure(s) performed: None  Critical Care performed: No ____________________________________________   INITIAL IMPRESSION / ASSESSMENT AND PLAN / ED COURSE  Pertinent labs & imaging results that were available during my care of the patient were reviewed by me and considered in my medical decision making (see chart for details).  Well appearing and NAD in spite of abd pain.  VSS, labs  reassuring.  Reviewed documentation from 2015 visit regarding ischemic colitis.  Discussed by phone with Dr. Mechele CollinElliott.  Will proceed with admission for 13-hour IV contrast dye prep so she can be fully evaluated for ischemic colitis with a contrasted CT(A) scan.  Holding off on PPI given that symptoms appear to be lower GI instead of upper.  No epigastric tenderness nor upper abdominal tenderness.  Discussed with hospitalist who agrees with the plan, and I updated the patient.   ____________________________________________  FINAL CLINICAL IMPRESSION(S) / ED DIAGNOSES  Final diagnoses:  BRBPR (bright red blood per rectum)  Abdominal pain, unspecified abdominal location     MEDICATIONS GIVEN DURING THIS VISIT:  Medications - No data to display   NEW OUTPATIENT MEDICATIONS STARTED DURING THIS VISIT:  New Prescriptions   No medications on file      Note:  This document was prepared using Dragon voice recognition software and may include unintentional dictation errors.   Loleta Roseory Sadarius [name], MD 05/24/16 1901

## 2016-05-24 NOTE — ED Notes (Signed)
MD at bedside. 
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
## 2016-05-24 NOTE — H&P (Signed)
[facility] [place] [facility]   PATIENT NAME: Yvette Collier    MR#:  [id number]  DATE OF BIRTH:  03/16/1941  DATE OF ADMISSION:  05/24/2016  PRIMARY CARE PHYSICIAN: Hyman Hopes, MD   REQUESTING/REFERRING PHYSICIAN: York Cerise  CHIEF COMPLAINT:   Chief Complaint  Patient presents with  . Rectal Bleeding  . Abdominal Pain    HISTORY OF PRESENT ILLNESS: Yvette Collier  is a 75 y.o. 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 Aggie Cosier also and he gives some medicines for her the gastritis. Since then up until now patient was doing fine for last 1 or 2 days she had some pain in her lower abdominal area and today in the afternoon she started having bleeding with her stool it was frank red colored blood along with her stool, and not associated with any nausea or vomiting. Concerned with this she came to emergency room her hemoglobin is stable but because of her strong past medical history and history of GI bleed ER physician spoke to GI doctors on call Dr. Mechele Collin and he suggested to admit the patient and do a CT scan of the abdomen.  PAST MEDICAL HISTORY:   Past Medical History  Diagnosis Date  . GERD (gastroesophageal reflux disease)   . Asthma   . Diabetes mellitus without complication (HCC)   . IBS (irritable bowel syndrome)   . History of colon polyps   . Hypertensive heart disease   . Osteoarthritis   . Chronic lower back pain   . Ischemic colitis (HCC)   .  Gastrointestinal bleed   . COPD (chronic obstructive pulmonary disease) (HCC)   . 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 Non-obstructive Coronary Artery Disease     a. 05/2009 Cath [facility]): minimal nonobs dzs.  Marland Kitchen PAF (paroxysmal atrial fibrillation) (HCC)     a. 09/2014 during GI illness;  b. CHA2DS2VASc = 5 (not currently on OAC 2/2 h/o GIB/ischemic colitis); c. 09/2014 Echo: EF 60-65%, imparied relaxation, nl RV size/fxn.    PAST SURGICAL HISTORY: Past Surgical History  Procedure Laterality Date  . Foot surgery Right   . Stomach surgery    . Abdominal hysterectomy    . Hemorrhoid banding    . Left total hip arthroplasty  2012  . Back surgery      x 3, last 2004.  Marland Kitchen Cholecystectomy    . Appendectomy    . Tonsillectomy      SOCIAL HISTORY:  Social History  Substance Use Topics  . Smoking status: Former Smoker -- 1.00 packs/day for 1 years  . Smokeless tobacco: Never Used  . Alcohol Use: No    FAMILY HISTORY:  Family History  Problem Relation Age of Onset  . Heart attack Mother   . Hypertension Mother   . Heart disease Father   . Hypertension Father   . Hypertension Brother   . Hypertension Maternal Aunt     DRUG ALLERGIES:  Allergies  Allergen Reactions  . Effexor [Venlafaxine] Hives  . Ivp Dye [Iodinated Diagnostic Agents] Hives  .  Lisinopril Swelling and Other (See Comments)    Reaction:  Tongue/mouth swelling   . Sulfa Antibiotics Hives    REVIEW OF SYSTEMS:   CONSTITUTIONAL: No fever, fatigue or weakness.  EYES: No blurred or double vision.  EARS, NOSE, AND THROAT: No tinnitus or ear pain.  RESPIRATORY: No cough, shortness of breath, wheezing or hemoptysis.  CARDIOVASCULAR: No chest pain, orthopnea, edema.  GASTROINTESTINAL: No nausea, vomiting, diarrhea , mild abdominal pain. Her blood in the stool. GENITOURINARY: No dysuria, hematuria.  ENDOCRINE: No polyuria, nocturia,  HEMATOLOGY: No anemia, easy bruising or bleeding SKIN:  No rash or lesion. MUSCULOSKELETAL: No joint pain or arthritis.   NEUROLOGIC: No tingling, numbness, weakness.  PSYCHIATRY: No anxiety or depression.   MEDICATIONS AT HOME:  Prior to Admission medications   Medication Sig Start Date End Date Taking? Authorizing Provider  aspirin EC 81 MG tablet Take 81 mg by mouth daily.   Yes Historical Provider, MD  cetirizine (ZYRTEC) 10 MG tablet Take 10 mg by mouth daily.   Yes Historical Provider, MD  cholestyramine light (PREVALITE) 4 g packet Take 2 g by mouth 2 (two) times daily.   Yes Historical Provider, MD  dexlansoprazole (DEXILANT) 60 MG capsule Take 1 capsule (60 mg total) by mouth daily. 11/21/15  Yes Midge Miniumarren Wohl, MD  dicyclomine (BENTYL) 20 MG tablet Take 20 mg by mouth 2 (two) times daily.    Yes Historical Provider, MD  diltiazem (CARDIZEM CD) 120 MG 24 hr capsule Take 1 capsule (120 mg total) by mouth daily after breakfast. 05/25/15  Yes Antonieta Ibaimothy J Gollan, MD  glipiZIDE (GLUCOTROL XL) 2.5 MG 24 hr tablet Take 2.5 mg by mouth daily with breakfast.   Yes Historical Provider, MD  lactobacillus acidophilus (BACID) TABS tablet Take 1 tablet by mouth daily.   Yes Historical Provider, MD  losartan (COZAAR) 25 MG tablet Take 25 mg by mouth daily.    Yes Historical Provider, MD  metoprolol succinate (TOPROL-XL) 50 MG 24 hr tablet Take 1 tablet (50 mg total) by mouth every evening. Take with or immediately following a meal. 04/09/16  Yes Antonieta Ibaimothy J Gollan, MD  oxyCODONE (OXY IR/ROXICODONE) 5 MG immediate release tablet Take 5 mg by mouth 3 (three) times daily as needed for severe pain.   Yes Historical Provider, MD  pregabalin (LYRICA) 25 MG capsule Take 25 mg by mouth 2 (two) times daily.   Yes Historical Provider, MD  promethazine (PHENERGAN) 12.5 MG tablet Take 1 tablet (12.5 mg total) by mouth every 6 (six) hours as needed for nausea or vomiting. 11/13/15  Yes Midge Miniumarren Wohl, MD  ranitidine (ZANTAC) 150 MG tablet Take 1 tablet (150 mg total) by mouth 2  (two) times daily. 11/13/15  Yes Midge Miniumarren Wohl, MD  simvastatin (ZOCOR) 10 MG tablet Take 10 mg by mouth every evening.    Yes Historical Provider, MD  cefUROXime (CEFTIN) 250 MG tablet Take 1 tablet (250 mg total) by mouth 2 (two) times daily with a meal. Patient not taking: Reported on 05/21/2016 01/15/16   Ewing SchleinGregory Crisp, MD  cefUROXime (CEFTIN) 250 MG tablet Take 1 tablet (250 mg total) by mouth 2 (two) times daily with a meal. Patient not taking: Reported on 02/28/2016 02/28/16   Ewing SchleinGregory Crisp, MD  cefUROXime (CEFTIN) 250 MG tablet Take 1 tablet (250 mg total) by mouth 2 (two) times daily with a meal. Patient not taking: Reported on 05/21/2016 05/08/16   Ewing SchleinGregory Crisp, MD      PHYSICAL EXAMINATION:   VITAL  SIGNS: Blood pressure 158/80, pulse 57, temperature 98.9 F (37.2 C), temperature source Oral, resp. rate 16, height 5\' 5"  (1.651 m), weight 70.308 kg (155 lb), SpO2 100 %.  GENERAL:  75 y.o.-year-old patient lying in the bed with no acute distress.  EYES: Pupils equal, round, reactive to light and accommodation. No scleral icterus. Extraocular muscles intact.  HEENT: Head atraumatic, normocephalic. Oropharynx and nasopharynx clear.  NECK:  Supple, no jugular venous distention. No thyroid enlargement, no tenderness.  LUNGS: Normal breath sounds bilaterally, no wheezing, rales,rhonchi or crepitation. No use of accessory muscles of respiration.  CARDIOVASCULAR: S1, S2 normal. No murmurs, rubs, or gallops.  ABDOMEN: Soft, mild lower abdomen tender , nondistended. Bowel sounds present. No organomegaly or mass.  EXTREMITIES: No pedal edema, cyanosis, or clubbing.  NEUROLOGIC: Cranial nerves II through XII are intact. Muscle strength 5/5 in all extremities. Sensation intact. Gait not checked.  PSYCHIATRIC: The patient is alert and oriented x 3.  SKIN: No obvious rash, lesion, or ulcer.   LABORATORY PANEL:   CBC  Recent Labs Lab 05/24/16 1752  WBC 7.3  HGB 11.6*  HCT 35.6  PLT 315  MCV  91.1  MCH 29.7  MCHC 32.6  RDW 13.2   ------------------------------------------------------------------------------------------------------------------  Chemistries   Recent Labs Lab 05/24/16 1752  NA 141  K 3.6  CL 108  CO2 26  GLUCOSE 146*  BUN 22*  CREATININE 1.24*  CALCIUM 9.5  AST 19  ALT 13*  ALKPHOS 119  BILITOT 0.4   ------------------------------------------------------------------------------------------------------------------ estimated creatinine clearance is 38 mL/min (by C-G formula based on Cr of 1.24). ------------------------------------------------------------------------------------------------------------------ No results for input(s): TSH, T4TOTAL, T3FREE, THYROIDAB in the last 72 hours.  Invalid input(s): FREET3   Coagulation profile No results for input(s): INR, PROTIME in the last 168 hours. ------------------------------------------------------------------------------------------------------------------- No results for input(s): DDIMER in the last 72 hours. -------------------------------------------------------------------------------------------------------------------  Cardiac Enzymes No results for input(s): CKMB, TROPONINI, MYOGLOBIN in the last 168 hours.  Invalid input(s): CK ------------------------------------------------------------------------------------------------------------------ Invalid input(s): POCBNP  ---------------------------------------------------------------------------------------------------------------  Urinalysis    Component Value Date/Time   COLORURINE STRAW* 07/19/2015 1434   COLORURINE Straw 09/15/2014 0926   APPEARANCEUR CLEAR* 07/19/2015 1434   APPEARANCEUR Clear 09/15/2014 0926   LABSPEC 1.008 07/19/2015 1434   LABSPEC 1.008 09/15/2014 0926   PHURINE 6.0 07/19/2015 1434   PHURINE 6.0 09/15/2014 0926   GLUCOSEU 50* 07/19/2015 1434   GLUCOSEU 50 mg/dL 16/09/9603 5409   HGBUR NEGATIVE 07/19/2015  1434   HGBUR Negative 09/15/2014 0926   BILIRUBINUR NEGATIVE 07/19/2015 1434   BILIRUBINUR Negative 09/15/2014 0926   KETONESUR NEGATIVE 07/19/2015 1434   KETONESUR Negative 09/15/2014 0926   PROTEINUR NEGATIVE 07/19/2015 1434   PROTEINUR Negative 09/15/2014 0926   NITRITE NEGATIVE 07/19/2015 1434   NITRITE Negative 09/15/2014 0926   LEUKOCYTESUR NEGATIVE 07/19/2015 1434   LEUKOCYTESUR Negative 09/15/2014 0926     RADIOLOGY: No results found.  EKG: Orders placed or performed during the hospital encounter of 03/18/16  . ED EKG  . ED EKG  . EKG 12-Lead  . EKG 12-Lead  . EKG    IMPRESSION AND PLAN  * Lower GI bleed   Most likely ischemic colitis   Will get a CT of the abdomen with contrast tomorrow after preparation as patient has allergy to dye.   Hemoglobin is currently stable, continue monitoring, antacids IV twice a day.   GI consult, ER physician already spoke to Dr. Mechele Collin.  * A. Fib   Continue Cardizem oral.  * Chronic pain  Continue oxycodone.  * Hyperlipidemia   Continue simvastatin.    * COPD   No exacerbation symptoms.  * Diabetes   Keep on insulin sliding scale coverage as patient is only on liquid diet.  All the records are reviewed and case discussed with ED provider. Management plans discussed with the patient, family and they are in agreement.  CODE STATUS: Full code Code Status History    Date Active Date Inactive Code Status Order ID Comments User Context   07/13/2015  8:01 AM 07/13/2015  6:59 PM Full Code [id number]  Crissie Figures, MD Inpatient    Advance Directive Documentation        Most Recent Value   Type of Advance Directive  Healthcare Power of Attorney, Living will   Pre-existing out of facility DNR order (yellow form or pink MOST form)     "MOST" Form in Place?         TOTAL TIME TAKING CARE OF THIS PATIENT: 50 minutes.    Altamese Dilling M.D on 05/24/2016   Between 7am to 6pm - Pager - [phone]  After 6pm  go to [email or web address] - password [email or web address]  [facility]  Office  [phone]  CC: Primary care physician; Hyman Hopes, MD   Note: This dictation was prepared with Dragon dictation along with smaller phrase technology. Any transcriptional errors that result from this process are unintentional.

## 2016-05-25 ENCOUNTER — Inpatient Hospital Stay: Payer: Medicare Other

## 2016-05-25 ENCOUNTER — Encounter: Payer: Self-pay | Admitting: Radiology

## 2016-05-25 DIAGNOSIS — K922 Gastrointestinal hemorrhage, unspecified: Secondary | ICD-10-CM | POA: Diagnosis not present

## 2016-05-25 LAB — CBC
HCT: 35 % (ref 35.0–47.0)
HEMOGLOBIN: 11.5 g/dL — AB (ref 12.0–16.0)
MCH: 29.6 pg (ref 26.0–34.0)
MCHC: 33 g/dL (ref 32.0–36.0)
MCV: 89.8 fL (ref 80.0–100.0)
Platelets: 298 10*3/uL (ref 150–440)
RBC: 3.9 MIL/uL (ref 3.80–5.20)
RDW: 13 % (ref 11.5–14.5)
WBC: 5.3 10*3/uL (ref 3.6–11.0)

## 2016-05-25 LAB — BASIC METABOLIC PANEL
ANION GAP: 7 (ref 5–15)
BUN: 16 mg/dL (ref 6–20)
CALCIUM: 9.3 mg/dL (ref 8.9–10.3)
CO2: 25 mmol/L (ref 22–32)
Chloride: 107 mmol/L (ref 101–111)
Creatinine, Ser: 0.93 mg/dL (ref 0.44–1.00)
GFR calc Af Amer: >60 mL/min (ref >60)
GFR calc non Af Amer: 58 mL/min — ABNORMAL LOW (ref >60)
GLUCOSE: 162 mg/dL — AB (ref 65–99)
Potassium: 4.3 mmol/L (ref 3.5–5.1)
Sodium: 139 mmol/L (ref 135–145)

## 2016-05-25 LAB — GLUCOSE, CAPILLARY
GLUCOSE-CAPILLARY: 182 mg/dL — AB (ref 65–99)
Glucose-Capillary: 153 mg/dL — ABNORMAL HIGH (ref 65–99)
Glucose-Capillary: 137 mg/dL — ABNORMAL HIGH (ref 65–99)
Glucose-Capillary: 284 mg/dL — ABNORMAL HIGH (ref 65–99)

## 2016-05-25 MED ORDER — DIPHENHYDRAMINE HCL 25 MG PO CAPS
50.0000 mg | ORAL_CAPSULE | Freq: Once | ORAL | Status: AC
  Administered 2016-05-25: 50 mg via ORAL
  Filled 2016-05-25: qty 2

## 2016-05-25 MED ORDER — DIATRIZOATE MEGLUMINE & SODIUM 66-10 % PO SOLN
30.0000 mL | Freq: Once | ORAL | Status: AC
  Administered 2016-05-25: 30 mL via ORAL

## 2016-05-25 MED ORDER — IOPAMIDOL (ISOVUE-300) INJECTION 61%
100.0000 mL | Freq: Once | INTRAVENOUS | Status: AC | PRN
  Administered 2016-05-25: 100 mL via INTRAVENOUS
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
## 2016-05-25 NOTE — Consult Note (Signed)
GI Inpatient Consult Note  Reason for Consult: GI bleed   Attending Requesting Consult: Dr. Elisabeth Pigeon  History of Present Illness: Yvette Collier is a 75 y.o. female with a history of Afib not on anticoagulation, transient cerebral ischemia, COPD, asthma, DM II, HTN heart disease, and GERD admitted with a GI bleed.  Of note, she has a history of ischemic colitis and GI bleed in 2015 with work-up including EGD and colonoscopy.  Per patient, hemorrhoids and diverticulosis were noted (procedure reports not available in Epic for review).  Patient reports she was in her usual state of health when she experienced acute-onset of lower abdominal pain .  Last in the afternoon, she began having BRB with loose BMs.  She presented to the [facility] ED afebrile, VSS, and Hgb stable at 11.6.  WBCs WNL. CT a/p was recommended, which will be obtained following completion of pre-medication regimen due to patient's contrast allergy.  She was placed on IV fluids and IV antacids.  Serial Hgb remain stable at 11.5.   Since admission, patient reports continued achy lower abdominal pain.  Severity is currently a 6/10.  The pain worsens significantly and becomes more like a cramping sensation leading up to BMs.  Her last two BMs contained a small amount of "maroon blood" intermixed with stool and on the toilet tissue; volume is decreasing with each BM per patient.  She also reports increased abdominal pain with urination, but no dark urine or frank hematuria.  She endorses mild nausea w/o vomiting.  Weakness and fatigue are also slowly improving.  Patient also notes a headache, but believes it is due to lack of sleep last night.  No fevers, chills, dysphagia, epigastric pain, or change in bowel habits.  Past Medical History:  Past Medical History  Diagnosis Date  . GERD (gastroesophageal reflux disease)   . Asthma   . Diabetes mellitus without complication (HCC)   . IBS (irritable bowel syndrome)   . History of colon polyps   .  Hypertensive heart disease   . Osteoarthritis   . Chronic lower back pain   . Ischemic colitis (HCC)   . Gastrointestinal bleed   . COPD (chronic obstructive pulmonary disease) (HCC)   . 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 Non-obstructive Coronary Artery Disease     a. 05/2009 Cath [facility]): minimal nonobs dzs.  Marland Kitchen PAF (paroxysmal atrial fibrillation) (HCC)     a. 09/2014 during GI illness;  b. CHA2DS2VASc = 5 (not currently on OAC 2/2 h/o GIB/ischemic colitis); c. 09/2014 Echo: EF 60-65%, imparied relaxation, nl RV size/fxn.  . Hypertension     Problem List: Patient Active Problem List   Diagnosis Date Noted  . GI bleed 05/24/2016  . Degenerative joint disease (DJD) of hip 05/08/2016  . 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 Hypertensive heart disease   . Hemorrhage of gastrointestinal tract 11/13/2015  . Lumbosacral facet joint syndrome 07/26/2015  . Angioedema 07/13/2015  . DM2 (diabetes mellitus, type 2) (HCC) 07/13/2015  . Status post lumbar laminectomy 06/19/2015  . Lumbar radiculopathy 06/19/2015  . DDD (degenerative disc disease), lumbar 05/11/2015  . Facet syndrome, lumbar 05/11/2015  . Lumbar post-laminectomy syndrome 05/11/2015  . Sacroiliac joint disease 05/11/2015  . 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 Neuropathy due to secondary diabetes (HCC) 05/11/2015  . Essential hypertension 01/03/2015  . CAD (coronary artery disease)   . PAF (paroxysmal atrial fibrillation) (HCC) 10/20/2014  . Ischemic colitis (HCC) 10/20/2014  .  Diabetes mellitus type 2 with complications (HCC) 10/20/2014  . COPD (chronic obstructive pulmonary disease) (HCC) 10/20/2014  . Hyperlipidemia 10/20/2014  . GERD (gastroesophageal reflux disease) 10/20/2014  . Internal hemorrhoid, bleeding 07/14/2013    Past Surgical History: Past Surgical History  Procedure Laterality Date  . Foot surgery Right   . Stomach surgery    . Abdominal  hysterectomy    . Hemorrhoid banding    . Left total hip arthroplasty  2012  . Back surgery      x 3, last 2004.  Marland Kitchen Cholecystectomy    . Appendectomy    . Tonsillectomy      Allergies: Allergies  Allergen Reactions  . Effexor [Venlafaxine] Hives  . Ivp Dye [Iodinated Diagnostic Agents] Hives  . Lisinopril Swelling and Other (See Comments)    Reaction:  Tongue/mouth swelling   . Sulfa Antibiotics Hives    Home Medications: Facility-administered medications prior to admission  Medication Dose Route Frequency Provider Last Rate Last Dose  . bupivacaine (MARCAINE) 0.5 % injection 30 mL  30 mL Other Once Ewing Schlein, MD      . bupivacaine (PF) (MARCAINE) 0.25 % injection 30 mL  30 mL Other Once Ewing Schlein, MD      . bupivacaine (PF) (MARCAINE) 0.25 % injection 30 mL  30 mL Other Once Ewing Schlein, MD      . ceFAZolin (ANCEF) IVPB 1 g/50 mL premix  1 g Intravenous Once Ewing Schlein, MD      . ceFAZolin (ANCEF) IVPB 1 g/50 mL premix  1 g Intravenous Once Ewing Schlein, MD      . fentaNYL (SUBLIMAZE) injection 100 mcg  100 mcg Intravenous Once Ewing Schlein, MD      . fentaNYL (SUBLIMAZE) injection 100 mcg  100 mcg Intravenous Once Ewing Schlein, MD      . lactated ringers infusion 1,000 mL  1,000 mL Intravenous Continuous Ewing Schlein, MD      . lactated ringers infusion 1,000 mL  1,000 mL Intravenous Continuous Ewing Schlein, MD      . lactated ringers infusion 1,000 mL  1,000 mL Intravenous Continuous Ewing Schlein, MD      . lidocaine (PF) (XYLOCAINE) 1 % injection 10 mL  10 mL Subcutaneous Once Ewing Schlein, MD      . midazolam (VERSED) 5 MG/5ML injection 5 mg  5 mg Intravenous Once Ewing Schlein, MD      . midazolam (VERSED) 5 MG/5ML injection 5 mg  5 mg Intravenous Once Ewing Schlein, MD      . orphenadrine (NORFLEX) injection 60 mg  60 mg Intramuscular Once Ewing Schlein, MD      . orphenadrine (NORFLEX) injection 60 mg  60 mg Intramuscular Once Ewing Schlein, MD      .  orphenadrine (NORFLEX) injection 60 mg  60 mg Intramuscular Once Ewing Schlein, MD      . triamcinolone acetonide (KENALOG-40) injection 40 mg  40 mg Other Once Ewing Schlein, MD      . triamcinolone acetonide [facility]) injection 40 mg  40 mg Other Once Ewing Schlein, MD      . triamcinolone acetonide [facility]) injection 40 mg  40 mg Other Once Ewing Schlein, MD      . triamcinolone acetonide (KENALOG-40) injection 40 mg  40 mg Other Once Ewing Schlein, MD       Prescriptions prior to admission  Medication Sig Dispense Refill Last Dose  . aspirin EC 81 MG tablet Take 81 mg by mouth daily.  05/24/2016 at 0630  . cetirizine (ZYRTEC) 10 MG tablet Take 10 mg by mouth daily.   05/23/2016 at Unknown time  . cholestyramine light (PREVALITE) 4 g packet Take 2 g by mouth 2 (two) times daily.   05/24/2016 at Unknown time  . dexlansoprazole (DEXILANT) 60 MG capsule Take 1 capsule (60 mg total) by mouth daily. 30 capsule 6 05/24/2016 at Unknown time  . dicyclomine (BENTYL) 20 MG tablet Take 20 mg by mouth 2 (two) times daily.    05/24/2016 at Unknown time  . diltiazem (CARDIZEM CD) 120 MG 24 hr capsule Take 1 capsule (120 mg total) by mouth daily after breakfast. 90 capsule 3 05/24/2016 at Unknown time  . glipiZIDE (GLUCOTROL XL) 2.5 MG 24 hr tablet Take 2.5 mg by mouth daily with breakfast.   05/24/2016 at Unknown time  . lactobacillus acidophilus (BACID) TABS tablet Take 1 tablet by mouth daily.   05/24/2016 at Unknown time  . losartan (COZAAR) 25 MG tablet Take 25 mg by mouth daily.    05/24/2016 at Unknown time  . metoprolol succinate (TOPROL-XL) 50 MG 24 hr tablet Take 1 tablet (50 mg total) by mouth every evening. Take with or immediately following a meal. 90 tablet 3 05/23/2016 at 1830  . oxyCODONE (OXY IR/ROXICODONE) 5 MG immediate release tablet Take 5 mg by mouth 3 (three) times daily as needed for severe pain.   05/24/2016 at 0630  . pregabalin (LYRICA) 25 MG capsule Take 25 mg by mouth 2 (two) times daily.    05/24/2016 at Unknown time  . promethazine (PHENERGAN) 12.5 MG tablet Take 1 tablet (12.5 mg total) by mouth every 6 (six) hours as needed for nausea or vomiting. 30 tablet 2 05/23/2016 at Unknown time  . ranitidine (ZANTAC) 150 MG tablet Take 1 tablet (150 mg total) by mouth 2 (two) times daily. 60 tablet 3 Past Week at Unknown time  . simvastatin (ZOCOR) 10 MG tablet Take 10 mg by mouth every evening.    05/23/2016 at Unknown time  . cefUROXime (CEFTIN) 250 MG tablet Take 1 tablet (250 mg total) by mouth 2 (two) times daily with a meal. (Patient not taking: Reported on 05/21/2016) 14 tablet 0 Not Taking  . cefUROXime (CEFTIN) 250 MG tablet Take 1 tablet (250 mg total) by mouth 2 (two) times daily with a meal. (Patient not taking: Reported on 02/28/2016) 14 tablet 0 Not Taking  . cefUROXime (CEFTIN) 250 MG tablet Take 1 tablet (250 mg total) by mouth 2 (two) times daily with a meal. (Patient not taking: Reported on 05/21/2016) 14 tablet 0 Not Taking   Home medication reconciliation was completed with the patient.   Scheduled Inpatient Medications:   . acidophilus  1 capsule Oral Daily  . cholestyramine light  2 g Oral BID  . diltiazem  120 mg Oral QPC breakfast  . famotidine (PEPCID) IV  20 mg Intravenous Q12H  . insulin aspart  0-9 Units Subcutaneous TID WC  . pregabalin  25 mg Oral BID  . simvastatin  10 mg Oral QPM    Continuous Inpatient Infusions:   . sodium chloride 75 mL/hr at 05/25/16 0803    PRN Inpatient Medications:  morphine injection, ondansetron (ZOFRAN) IV, oxyCODONE  Family History: family history includes Heart attack in her mother; Heart disease in her father; Hypertension in her brother, father, maternal aunt, and mother.    Social History:   reports that she has never smoked. She has never used smokeless tobacco. She reports that  she does not drink alcohol or use illicit drugs.   Review of Systems: Constitutional: Weight is stable.  Eyes: No changes in vision. ENT: No  oral lesions, sore throat.  GI: see HPI.  Heme/Lymph: No easy bruising.  CV: No chest pain.  GU: No hematuria.  Integumentary: No rashes.  Neuro: No headaches.  Psych: No depression/anxiety.  Endocrine: No heat/cold intolerance.  Allergic/Immunologic: No urticaria.  Resp: No cough, SOB.  Musculoskeletal: No joint swelling.    Physical Examination: BP 155/84 mmHg  Pulse 70  Temp(Src) 98.3 F (36.8 C) (Oral)  Resp 20  Ht 5\' 5"  (1.651 m)  Wt 73.165 kg (161 lb 4.8 oz)  BMI 26.84 kg/m2  SpO2 98% Gen: NAD, alert and oriented x 4 HEENT: PEERLA, EOMI, Neck: supple, no JVD or thyromegaly Chest: CTA bilaterally, no wheezes, crackles, or other adventitious sounds CV: RRR, no m/g/c/r Abd: soft, generalized to moderate palpation throughout abdomen with RLQ & LLQ >>RUQ and LUQ, ND, +BS in all four quadrants; no HSM, guarding, ridigity, or rebound tenderness Ext: no edema, well perfused with 2+ pulses Skin: no rash or lesions noted Lymph: no LAD  Data: Lab Results  Component Value Date   WBC 5.3 05/25/2016   HGB 11.5* 05/25/2016   HCT 35.0 05/25/2016   MCV 89.8 05/25/2016   PLT 298 05/25/2016    Recent Labs Lab 05/24/16 1752 05/25/16 0524  HGB 11.6* 11.5*   Lab Results  Component Value Date   NA 139 05/25/2016   K 4.3 05/25/2016   CL 107 05/25/2016   CO2 25 05/25/2016   BUN 16 05/25/2016   CREATININE 0.93 05/25/2016   Lab Results  Component Value Date   ALT 13* 05/24/2016   AST 19 05/24/2016   ALKPHOS 119 05/24/2016   BILITOT 0.4 05/24/2016   No results for input(s): APTT, INR, PTT in the last 168 hours.   Assessment/Plan: Yvette Collier is a 75 y.o. female with a history of Afib, transient cerebral ischemia, COPD, asthma, DM II, HTN heart disease, and GERD admitted with a GI bleed.  She has a h/o of ischemic colitis with lower GI bleeding in 2015.  Extensive work-up including EGD and colonoscopy was unrevealing per patients account of results.  Vascular surgery  consultation was also obtained at that time.  Patient is currently afebrile with stable VS and Hgb.  CT a/p w/ contrast negative for acute findings.  Patient reports decreasing amount of blood in each subsequent stool, though abdominal pain is relatively unchanged.  Patient's significant coronary disease as well as similar presentation to 2015 suggests this is likely ischemic colitis once again.  Recommend continuing supportive measures and clear liquid diet.  Further recs pending patient's progress.  Recommendations: - Continue supportive measures, begin clear liquid diet - Monitor serial Hgb, transfuse if <7 - Further recs per Dr. Mechele CollinElliott  Thank you for the consult. We will follow along with you. Please call with questions or concerns.  Burman FreestoneMichelle C Johnson, PA-C [facility] Phone: [phone]) [phone] Pager: [phone]) [phone]

## 2016-05-25 NOTE — Progress Notes (Signed)
[employer] [facility] [place] [facility]   PATIENT NAME: Yvette Collier    MR#:  [id number]  DATE OF BIRTH:  14-Oct-1941  SUBJECTIVE:   Patient is here due to rectal bleeding and abdominal pain and suspicion for ischemic colitis. Patient has CT abdomen pelvis with contrast showing no evidence of any intra-abdominal pathology presently. Continues to have some abdominal pain but no further rectal bleeding. Hemoglobin stable.  REVIEW OF SYSTEMS:    Review of Systems  Constitutional: Negative for fever and chills.  HENT: Negative for congestion and tinnitus.   Eyes: Negative for blurred vision and double vision.  Respiratory: Negative for cough, shortness of breath and wheezing.   Cardiovascular: Negative for chest pain, orthopnea and PND.  Gastrointestinal: Positive for abdominal pain. Negative for nausea, vomiting and diarrhea.  Genitourinary: Negative for dysuria and hematuria.  Neurological: Negative for dizziness, sensory change and focal weakness.  All other systems reviewed and are negative.   Nutrition: Clear liquid Tolerating Diet: Yes Tolerating PT: Ambulatory     DRUG ALLERGIES:   Allergies  Allergen Reactions  . Effexor [Venlafaxine] Hives  . Ivp Dye [Iodinated Diagnostic Agents] Hives  . Lisinopril Swelling and Other (See Comments)    Reaction:  Tongue/mouth swelling   . Sulfa Antibiotics Hives    VITALS:  Blood pressure 142/86, pulse 86, temperature 97.9 F (36.6 C), temperature source Oral, resp. rate 18, height 5\' 5"  (1.651 m), weight 73.165 kg (161 lb 4.8 oz), SpO2 99 %.  PHYSICAL EXAMINATION:   Physical Exam  GENERAL:  75 y.o.-year-old patient lying in the bed with no acute distress.  EYES: Pupils equal, round, reactive to light and accommodation. No scleral icterus. Extraocular muscles intact.  HEENT: Head atraumatic, normocephalic. Oropharynx and nasopharynx clear.  NECK:  Supple, no jugular venous distention. No thyroid enlargement, no  tenderness.  LUNGS: Normal breath sounds bilaterally, no wheezing, rales, rhonchi. No use of accessory muscles of respiration.  CARDIOVASCULAR: S1, S2 normal. No murmurs, rubs, or gallops.  ABDOMEN: Soft, nontender, nondistended. Bowel sounds present. No organomegaly or mass.  EXTREMITIES: No cyanosis, clubbing or edema b/l.    NEUROLOGIC: Cranial nerves II through XII are intact. No focal Motor or sensory deficits b/l.   PSYCHIATRIC: The patient is alert and oriented x 3.  SKIN: No obvious rash, lesion, or ulcer.    LABORATORY PANEL:   CBC  Recent Labs Lab 05/25/16 0524  WBC 5.3  HGB 11.5*  HCT 35.0  PLT 298   ------------------------------------------------------------------------------------------------------------------  Chemistries   Recent Labs Lab 05/24/16 1752 05/25/16 0524  NA 141 139  K 3.6 4.3  CL 108 107  CO2 26 25  GLUCOSE 146* 162*  BUN 22* 16  CREATININE 1.24* 0.93  CALCIUM 9.5 9.3  AST 19  --   ALT 13*  --   ALKPHOS 119  --   BILITOT 0.4  --    ------------------------------------------------------------------------------------------------------------------  Cardiac Enzymes No results for input(s): TROPONINI in the last 168 hours. ------------------------------------------------------------------------------------------------------------------  RADIOLOGY:  Ct Abdomen Pelvis W Contrast  05/25/2016  CLINICAL DATA:  Diffuse lower abdominal pain for 2 weeks. GI bleed. Vomiting yesterday. EXAM: CT ABDOMEN AND PELVIS WITH CONTRAST TECHNIQUE: Multidetector CT imaging of the abdomen and pelvis was performed using the standard protocol following bolus administration of intravenous contrast. CONTRAST:  100mL ISOVUE-300 IOPAMIDOL (ISOVUE-300) INJECTION 61% COMPARISON:  09/20/2014 FINDINGS: Lower chest: Scarring noted in the lung bases. Heart is normal size. No effusions. Hepatobiliary: Prior cholecystectomy. No focal hepatic abnormality.  Mildly prominent biliary  ducts, likely post cholecystectomy. Pancreas: No focal abnormality or ductal dilatation. Spleen: No focal abnormality.  Normal size. Adrenals/Urinary Tract: No adrenal abnormality. No focal renal abnormality. No stones or hydronephrosis. Urinary bladder is unremarkable. Stomach/Bowel: Stomach, large and small bowel grossly unremarkable. Vascular/Lymphatic: No evidence of aneurysm or adenopathy. Reproductive: Prior hysterectomy.  No adnexal masses. Other: No free fluid or free air. Musculoskeletal: No acute bony abnormality or focal bone lesion. Postoperative changes in the lower lumbar spine. IMPRESSION: No acute findings in the abdomen or pelvis. Prior cholecystectomy. Electronically Signed   By: Charlett Nose M.D.   On: 05/25/2016 11:37     ASSESSMENT AND PLAN:   75 year old female with past medical history of diabetes, irritable bowel syndrome, history of colonic polyps, essential hypertension, chronic lower back pain, history of ischemic colitis, COPD, previous TIA, paroxysmal atrial fibrillation who presented to the hospital due to abdominal pain and rectal bleeding.   1. Abdominal pain/rectal bleeding-etiology unclear. Now resolved oh. Hemoglobin stable. -Suspicion for ischemic colitis is patient has a history of it although CT scan with contrast showing no acute pathology. -Continue supportive care with IV fluids, antiemetics, pain control. Await further gastroenterology input. Continue clear liquid diet.  2. History of paroxysmal atrial fibrillation-currently rate controlled. Continue Cardizem.  3. Neuropathy-continue local.  4. Hyperlipidemia-continue simvastatin.  5. Chronic back pain-continue oxycodone.  6. COPD-no acute exacerbation.  7. Diabetes- cont. sliding scale insulin.   All the records are reviewed and case discussed with Care Management/Social Workerr. Management plans discussed with the patient, family and they are in agreement.  CODE STATUS: Full  DVT Prophylaxis:  TEd's & SCD's  TOTAL TIME TAKING CARE OF THIS PATIENT: 30 minutes.   POSSIBLE D/C IN 1-2 DAYS, DEPENDING ON CLINICAL CONDITION.   Houston Siren M.D on 05/25/2016 at 3:04 PM  Between 7am to 6pm - Pager - [phone]  After 6pm go to [email or web address] - password [email or web address] [facility]  [facility]  Office  [phone]  CC: Primary care physician; Hyman Hopes, MD

## 2016-05-26 DIAGNOSIS — K922 Gastrointestinal hemorrhage, unspecified: Secondary | ICD-10-CM | POA: Diagnosis not present

## 2016-05-26 LAB — GLUCOSE, CAPILLARY: Glucose-Capillary: 107 mg/dL — ABNORMAL HIGH (ref 65–99)

## 2016-05-26 NOTE — Care Management Obs Status (Signed)
MEDICARE OBSERVATION STATUS NOTIFICATION   Patient Details  Name: Yvette Collier MRN: [id number] Date of Birth: August 25, 1941   Medicare Observation Status Notification Given:  No (patient discharged prior to letter being delivered)    Caren MacadamMichelle Bliss Behnke, RN 05/26/2016, 2:06 PM

## 2016-05-26 NOTE — Progress Notes (Signed)
Discharge orders received.  Reviewed the instructions with the patient and provided her with a copy of the instructions. Patient verbalized understanding of the instructions.  Patient discharged home with family. No acute distress noted at the time of discharge.

## 2016-05-26 NOTE — Discharge Summary (Signed)
[employer] - [place] at [facility]   PATIENT NAME: Yvette Collier    MR#:  [id number]  DATE OF BIRTH:  August 22, 1941  DATE OF ADMISSION:  05/24/2016 ADMITTING PHYSICIAN: Altamese Dilling, MD  DATE OF DISCHARGE: 05/26/2016 11:15 AM  PRIMARY CARE PHYSICIAN: Hyman Hopes, MD    ADMISSION DIAGNOSIS:  GI bleed [K92.2] BRBPR (bright red blood per rectum) [K62.5] Abdominal pain, unspecified abdominal location [R10.9]  DISCHARGE DIAGNOSIS:  Principal Problem:   GI bleed Active Problems:   GIB (gastrointestinal bleeding)   SECONDARY DIAGNOSIS:   Past Medical History  Diagnosis Date  . GERD (gastroesophageal reflux disease)   . Asthma   . Diabetes mellitus without complication (HCC)   . IBS (irritable bowel syndrome)   . History of colon polyps   . Hypertensive heart disease   . Osteoarthritis   . Chronic lower back pain   . Ischemic colitis (HCC)   . Gastrointestinal bleed   . COPD (chronic obstructive pulmonary disease) (HCC)   . 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 Non-obstructive Coronary Artery Disease     a. 05/2009 Cath [facility]): minimal nonobs dzs.  Marland Kitchen PAF (paroxysmal atrial fibrillation) (HCC)     a. 09/2014 during GI illness;  b. CHA2DS2VASc = 5 (not currently on OAC 2/2 h/o GIB/ischemic colitis); c. 09/2014 Echo: EF 60-65%, imparied relaxation, nl RV size/fxn.  . Hypertension     HOSPITAL COURSE:   75 year old female with past medical history of diabetes, irritable bowel syndrome, history of colonic polyps, essential hypertension, chronic lower back pain, history of ischemic colitis, COPD, previous TIA, paroxysmal atrial fibrillation who presented to the hospital due to abdominal pain and rectal bleeding.   1. Abdominal pain/rectal bleeding-This was thought to be related to possible ischemic colitis. -Patient has a history of it and therefore underwent a CT scan of the abdomen and pelvis with contrast which showed no acute  pathology -Patient was initially placed on a clear liquid diet and advance to regular diet which she is not tolerating. Patient was seen by gastroenterology who agreed with this management. -Her hemoglobin is stable and she's had no further rectal bleeding for the past 24 hours and therefore not being discharged home.  2. History of paroxysmal atrial fibrillation-currently rate controlled. She will Continue Cardizem, metoprolol.  3. Neuropathy-she will continue her Lyrica.  4. Hyperlipidemia- she will continue simvastatin.  5. Chronic back pain- she will continue oxycodone.  6. Hyperlipidemia - she will cont. Her Simvastatin  7. GERD/IBS - pt. Will cont. Her Dexilant/Bentyl.    DISCHARGE CONDITIONS:   Stable.   CONSULTS OBTAINED:  Treatment Team:  Scot Jun, MD  DRUG ALLERGIES:   Allergies  Allergen Reactions  . Effexor [Venlafaxine] Hives  . Ivp Dye [Iodinated Diagnostic Agents] Hives  . Lisinopril Swelling and Other (See Comments)    Reaction:  Tongue/mouth swelling   . Sulfa Antibiotics Hives    DISCHARGE MEDICATIONS:   Discharge Medication List as of 05/26/2016  9:56 AM    CONTINUE these medications which have NOT CHANGED   Details  aspirin EC 81 MG tablet Take 81 mg by mouth daily., Until Discontinued, Historical Med    cetirizine (ZYRTEC) 10 MG tablet Take 10 mg by mouth daily., Until Discontinued, Historical Med    cholestyramine light (PREVALITE) 4 g packet Take 2 g by mouth 2 (two) times daily., Until Discontinued, Historical Med    dexlansoprazole (DEXILANT) 60 MG capsule Take 1 capsule (60  mg total) by mouth daily., Starting 11/21/2015, Until Discontinued, Normal    dicyclomine (BENTYL) 20 MG tablet Take 20 mg by mouth 2 (two) times daily. , Until Discontinued, Historical Med    diltiazem (CARDIZEM CD) 120 MG 24 hr capsule Take 1 capsule (120 mg total) by mouth daily after breakfast., Starting 05/25/2015, Until Discontinued, Normal    glipiZIDE  (GLUCOTROL XL) 2.5 MG 24 hr tablet Take 2.5 mg by mouth daily with breakfast., Until Discontinued, Historical Med    lactobacillus acidophilus (BACID) TABS tablet Take 1 tablet by mouth daily., Until Discontinued, Historical Med    losartan (COZAAR) 25 MG tablet Take 25 mg by mouth daily. , Until Discontinued, Historical Med    metoprolol succinate (TOPROL-XL) 50 MG 24 hr tablet Take 1 tablet (50 mg total) by mouth every evening. Take with or immediately following a meal., Starting 04/09/2016, Until Discontinued, Normal    oxyCODONE (OXY IR/ROXICODONE) 5 MG immediate release tablet Take 5 mg by mouth 3 (three) times daily as needed for severe pain., Until Discontinued, Historical Med    pregabalin (LYRICA) 25 MG capsule Take 25 mg by mouth 2 (two) times daily., Until Discontinued, Historical Med    promethazine (PHENERGAN) 12.5 MG tablet Take 1 tablet (12.5 mg total) by mouth every 6 (six) hours as needed for nausea or vomiting., Starting 11/13/2015, Until Discontinued, Normal    ranitidine (ZANTAC) 150 MG tablet Take 1 tablet (150 mg total) by mouth 2 (two) times daily., Starting 11/13/2015, Until Discontinued, Normal    simvastatin (ZOCOR) 10 MG tablet Take 10 mg by mouth every evening. , Until Discontinued, Historical Med      STOP taking these medications     cefUROXime (CEFTIN) 250 MG tablet      cefUROXime (CEFTIN) 250 MG tablet      cefUROXime (CEFTIN) 250 MG tablet          DISCHARGE INSTRUCTIONS:   DIET:  Cardiac diet  DISCHARGE CONDITION:  Stable  ACTIVITY:  Activity as tolerated  OXYGEN:  Home Oxygen: No.   Oxygen Delivery: room air  DISCHARGE LOCATION:  home   If you experience worsening of your admission symptoms, develop shortness of breath, life threatening emergency, suicidal or homicidal thoughts you must seek medical attention immediately by calling 911 or calling your MD immediately  if symptoms less severe.  You Must read complete  instructions/literature along with all the possible adverse reactions/side effects for all the Medicines you take and that have been prescribed to you. Take any new Medicines after you have completely understood and accpet all the possible adverse reactions/side effects.   Please note  You were cared for by a hospitalist during your hospital stay. If you have any questions about your discharge medications or the care you received while you were in the hospital after you are discharged, you can call the unit and asked to speak with the hospitalist on call if the hospitalist that took care of you is not available. Once you are discharged, your primary care physician will handle any further medical issues. Please note that NO REFILLS for any discharge medications will be authorized once you are discharged, as it is imperative that you return to your primary care physician (or establish a relationship with a primary care physician if you do not have one) for your aftercare needs so that they can reassess your need for medications and monitor your lab values.     Today   No further rectal bleeding overnight. Hg  stable.  No abdominal pain, N/V.   VITAL SIGNS:  Blood pressure 129/57, pulse 54, temperature 97.9 F (36.6 C), temperature source Oral, resp. rate 16, height 5\' 5"  (1.651 m), weight 73.165 kg (161 lb 4.8 oz), SpO2 100 %.  I/O:   Intake/Output Summary (Last 24 hours) at 05/26/16 1314 Last data filed at 05/26/16 0951  Gross per 24 hour  Intake 1108.8 ml  Output   1500 ml  Net -391.2 ml    PHYSICAL EXAMINATION:  GENERAL:  75 y.o.-year-old patient lying in the bed with no acute distress.  EYES: Pupils equal, round, reactive to light and accommodation. No scleral icterus. Extraocular muscles intact.  HEENT: Head atraumatic, normocephalic. Oropharynx and nasopharynx clear.  NECK:  Supple, no jugular venous distention. No thyroid enlargement, no tenderness.  LUNGS: Normal breath sounds  bilaterally, no wheezing, rales,rhonchi. No use of accessory muscles of respiration.  CARDIOVASCULAR: S1, S2 normal. II/VI SEM at RSB, No rubs, or gallops.  ABDOMEN: Soft, non-tender, non-distended. Bowel sounds present. No organomegaly or mass.  EXTREMITIES: No pedal edema, cyanosis, or clubbing.  NEUROLOGIC: Cranial nerves II through XII are intact. No focal motor or sensory defecits b/l.  PSYCHIATRIC: The patient is alert and oriented x 3. Good affect.  SKIN: No obvious rash, lesion, or ulcer.   DATA REVIEW:   CBC  Recent Labs Lab 05/25/16 0524  WBC 5.3  HGB 11.5*  HCT 35.0  PLT 298    Chemistries   Recent Labs Lab 05/24/16 1752 05/25/16 0524  NA 141 139  K 3.6 4.3  CL 108 107  CO2 26 25  GLUCOSE 146* 162*  BUN 22* 16  CREATININE 1.24* 0.93  CALCIUM 9.5 9.3  AST 19  --   ALT 13*  --   ALKPHOS 119  --   BILITOT 0.4  --     Cardiac Enzymes No results for input(s): TROPONINI in the last 168 hours.   RADIOLOGY:  Ct Abdomen Pelvis W Contrast  05/25/2016  CLINICAL DATA:  Diffuse lower abdominal pain for 2 weeks. GI bleed. Vomiting yesterday. EXAM: CT ABDOMEN AND PELVIS WITH CONTRAST TECHNIQUE: Multidetector CT imaging of the abdomen and pelvis was performed using the standard protocol following bolus administration of intravenous contrast. CONTRAST:  ISOVUE-300 IOPAMIDOL (ISOVUE-300) INJECTION 61% COMPARISON:  09/20/2014 FINDINGS: Lower chest: Scarring noted in the lung bases. Heart is normal size. No effusions. Hepatobiliary: Prior cholecystectomy. No focal hepatic abnormality. Mildly prominent biliary ducts, likely post cholecystectomy. Pancreas: No focal abnormality or ductal dilatation. Spleen: No focal abnormality.  Normal size. Adrenals/Urinary Tract: No adrenal abnormality. No focal renal abnormality. No stones or hydronephrosis. Urinary bladder is unremarkable. Stomach/Bowel: Stomach, large and small bowel grossly unremarkable. Vascular/Lymphatic: No evidence  of aneurysm or adenopathy. Reproductive: Prior hysterectomy.  No adnexal masses. Other: No free fluid or free air. Musculoskeletal: No acute bony abnormality or focal bone lesion. Postoperative changes in the lower lumbar spine. IMPRESSION: No acute findings in the abdomen or pelvis. Prior cholecystectomy. Electronically Signed   By: Charlett Nose M.D.   On: 05/25/2016 11:37      Management plans discussed with the patient, family and they are in agreement.  CODE STATUS:     Code Status Orders        Start     Ordered   05/24/16 2124  Full code   Continuous     05/24/16 2123    Code Status History    Date Active Date Inactive Code Status Order ID  Comments User Context   07/13/2015  8:01 AM 07/13/2015  6:59 PM Full Code [id number]  Crissie Figures, MD Inpatient    Advance Directive Documentation        Most Recent Value   Type of Advance Directive  Healthcare Power of Attorney, Living will   Pre-existing out of facility DNR order (yellow form or pink MOST form)     "MOST" Form in Place?        TOTAL TIME TAKING CARE OF THIS PATIENT: 40 minutes.    Houston Siren M.D on 05/26/2016 at 1:14 PM  Between 7am to 6pm - Pager - [phone]  After 6pm go to [email or web address] - password [email or web address] [facility]  [facility]  Office  [phone]  CC: Primary care physician; Hyman Hopes, MD

## 2016-05-26 NOTE — Discharge Instructions (Signed)
Notify your doctor if your symptoms persist or worsen.  Take all medications as prescribed.  Drink plenty of fluids. Notify your doctor with any questions or concerns.

## 2016-06-03 ENCOUNTER — Ambulatory Visit: Payer: Medicare Other | Attending: Pain Medicine | Admitting: Pain Medicine

## 2016-06-03 ENCOUNTER — Encounter: Payer: Self-pay | Admitting: Pain Medicine

## 2016-06-03 VITALS — BP 120/67 | HR 46 | Temp 97.8°F | Resp 11 | Ht 65.0 in | Wt 157.0 lb

## 2016-06-03 DIAGNOSIS — M7060 Trochanteric bursitis, unspecified hip: Secondary | ICD-10-CM

## 2016-06-03 DIAGNOSIS — Z9889 Other specified postprocedural states: Secondary | ICD-10-CM | POA: Diagnosis not present

## 2016-06-03 DIAGNOSIS — G588 Other specified mononeuropathies: Secondary | ICD-10-CM

## 2016-06-03 DIAGNOSIS — M79606 Pain in leg, unspecified: Secondary | ICD-10-CM | POA: Diagnosis present

## 2016-06-03 DIAGNOSIS — M5136 Other intervertebral disc degeneration, lumbar region: Secondary | ICD-10-CM | POA: Diagnosis not present

## 2016-06-03 DIAGNOSIS — M545 Low back pain: Secondary | ICD-10-CM | POA: Insufficient documentation

## 2016-06-03 DIAGNOSIS — E1340 Other specified diabetes mellitus with diabetic neuropathy, unspecified: Secondary | ICD-10-CM

## 2016-06-03 DIAGNOSIS — M51369 Other intervertebral disc degeneration, lumbar region without mention of lumbar back pain or lower extremity pain: Secondary | ICD-10-CM

## 2016-06-03 DIAGNOSIS — M533 Sacrococcygeal disorders, not elsewhere classified: Secondary | ICD-10-CM

## 2016-06-03 DIAGNOSIS — M4806 Spinal stenosis, lumbar region: Secondary | ICD-10-CM | POA: Diagnosis not present

## 2016-06-03 DIAGNOSIS — M940 Chondrocostal junction syndrome [Tietze]: Secondary | ICD-10-CM

## 2016-06-03 DIAGNOSIS — M5416 Radiculopathy, lumbar region: Secondary | ICD-10-CM

## 2016-06-03 DIAGNOSIS — M160 Bilateral primary osteoarthritis of hip: Secondary | ICD-10-CM

## 2016-06-03 DIAGNOSIS — M961 Postlaminectomy syndrome, not elsewhere classified: Secondary | ICD-10-CM

## 2016-06-03 DIAGNOSIS — M48062 Spinal stenosis, lumbar region with neurogenic claudication: Secondary | ICD-10-CM

## 2016-06-03 DIAGNOSIS — M47816 Spondylosis without myelopathy or radiculopathy, lumbar region: Secondary | ICD-10-CM

## 2016-06-03 MED ORDER — CEFAZOLIN SODIUM 1-5 GM-% IV SOLN: 1.0000 g | Freq: Once | INTRAVENOUS | Status: DC

## 2016-06-03 MED ORDER — FENTANYL CITRATE (PF) 100 MCG/2ML IJ SOLN
100.0000 ug | Freq: Once | INTRAMUSCULAR | Status: AC
  Administered 2016-06-03: 100 ug via INTRAVENOUS
  Filled 2016-06-03: qty 2

## 2016-06-03 MED ORDER — ORPHENADRINE CITRATE 30 MG/ML IJ SOLN
60.0000 mg | Freq: Once | INTRAMUSCULAR | Status: AC
  Administered 2016-06-03: 60 mg via INTRAMUSCULAR
  Filled 2016-06-03: qty 2

## 2016-06-03 MED ORDER — TRIAMCINOLONE ACETONIDE 40 MG/ML IJ SUSP
40.0000 mg | Freq: Once | INTRAMUSCULAR | Status: AC
  Administered 2016-06-03: 40 mg
  Filled 2016-06-03: qty 1

## 2016-06-03 MED ORDER — BUPIVACAINE HCL (PF) 0.25 % IJ SOLN
30.0000 mL | Freq: Once | INTRAMUSCULAR | Status: AC
  Administered 2016-06-03: 30 mL
  Filled 2016-06-03: qty 30

## 2016-06-03 MED ORDER — MIDAZOLAM HCL 5 MG/5ML IJ SOLN
5.0000 mg | Freq: Once | INTRAMUSCULAR | Status: AC
  Administered 2016-06-03: 3 mg via INTRAVENOUS
  Filled 2016-06-03: qty 5

## 2016-06-03 MED ORDER — CEFUROXIME AXETIL 250 MG PO TABS: 250.0000 mg | ORAL_TABLET | Freq: Two times a day (BID) | ORAL | Status: DC

## 2016-06-03 MED ORDER — LACTATED RINGERS IV SOLN
1000.0000 mL | INTRAVENOUS | Status: DC
  Administered 2016-06-03: 1000 mL via INTRAVENOUS

## 2016-06-03 MED ORDER — CEFAZOLIN SODIUM 1 G IJ SOLR
INTRAMUSCULAR | Status: AC
  Administered 2016-06-03: 08:00:00
  Filled 2016-06-03: qty 10
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
## 2016-06-03 NOTE — Progress Notes (Signed)
Subjective:    Patient ID: Yvette RakersMary L Andres, female    DOB: November 25, 1941, 75 y.o.   MRN: [id number]  HPI  PROCEDURE:  Block of nerves to the sacroiliac joint.   NOTE:  The patient is a 75 y.o. female who [facility] for further evaluation and treatment of pain involving the lower back and lower extremity region with pain in the region of the buttocks as well. Prior MRI studies reveal  Degenerative disc disease lumbar spine Status post surgery lumbar region with multilevel degenerative changes lumbar spine with L4-L5 level most involved. Facet arthropathy, foraminal stenosis.  the patient is with reproduction of severe pain with palpation over the PSIS and PII S regions  There is concern regarding a significant component of the patient's pain being due to sacroiliac joint dysfunction The risks, benefits, expectations of the procedure have been discussed and explained to the patient who is understanding and willing to proceed with interventional treatment in attempt to decrease severity of patient's symptoms, minimize the risk of medication escalation and  hopefully retard the progression of the patient's symptoms. We will proceed with what is felt to be a medically necessary procedure, block of nerves to the sacroiliac joint.   DESCRIPTION OF PROCEDURE:  Block of nerves to the sacroiliac joint.   The patient was taken to the fluoroscopy suite. With the patient in the prone position with EKG, blood pressure, pulse, capnography, and pulse oximetry monitoring, IV Versed, IV fentanyl conscious sedation, Betadine prep of proposed entry site was performed.   Block of nerves at the L5 vertebral body level.   With the patient in prone position, under fluoroscopic guidance, a 22 -gauge needle was inserted at the L5 vertebral body level on the left side. With 15 degrees oblique orientation a 22 -gauge needle was inserted in the region known as Burton's eye or eye of the Scotty dog.  Following documentation of needle placement in the area of Burton's eye or eye of the Scotty dog under fluoroscopic guidance, needle placement was then accomplished at the sacral ala level on the left side.   Needle placement at the sacral ala.   With the patient in prone position under fluoroscopic guidance with AP view of the lumbosacral spine, a 22 -gauge needle was inserted in the region known as the sacral ala on the left side. Following documentation of needle placement on the left side under fluoroscopic guidance needle placement was then accomplished at the S1 foramen level.   Needle placement at the S1 foramen level.   With the patient in prone position under fluoroscopic guidance with AP view of the lumbosacral spine and cephalad orientation, a 22 -gauge needle was inserted at the superior and lateral border of the S1 foramen on the left side. Following documentation of needle placement at the S1 foramen level on the left side, needle placement was then accomplished at the S2 foramen level on the left side.   Needle placement at the S2 foramen level.   With the patient in prone position with AP view of the lumbosacral spine with cephalad orientation, a 22 - gauge needle was inserted at the superior and lateral border of the S2 foramen under fluoroscopic guidance on the left side. Following needle placement at the L5 vertebral body level, sacral ala, S1 foramen and S2 foramen on the left side, needle placement was verified on lateral view under fluoroscopic guidance.  Following needle placement documentation on lateral view, each needle was injected  with 1 mL of 0.25% bupivacaine and Kenalog.   BLOCK OF THE NERVES TO SACROILIAC JOINT ON THE RIGHT SIDE The procedure was performed on the right side at the same levels as was performed on the left side and utilizing the same technique as on the left side and was performed under fluoroscopic guidance as on the left side  Myoneural block  injections of the gluteal musculature region Following Betadine prep of proposed entry site a 22-gauge needle was inserted into the gluteal musculature region and following negative aspiration 2 cc of 0.25% bupivacaine with Norflex was injected for myoneural block injection of the gluteal musculature region times two.  The patient tolerated the procedure well  A total of  of Kenalog was utilized for the procedure.   PLAN:  1. Medications: The patient will continue presently prescribed medications Lyrica and oxycodone 2. The patient will be considered for modification of treatment regimen pending response to the procedure performed on today's visit.  3. The patient is to follow-up with primary care physician Dr. Lawerance Bach for evaluation of blood pressure and general medical condition following the procedure performed on today's visit.  4. Surgical evaluation as discussed. . The patient will follow-up with Dr. Wynetta Emery as discussed 5. Neurological evaluation as discussed. May consider [facility] EMG studies and other studies 6. The patient may be a candidate for radiofrequency procedures, implantation devices and other treatment pending response to treatment performed on today's visit and follow-up evaluation.  7. The patient has been advised to adhere to proper body mechanics and to avoid activities which may exacerbate the patient's symptoms.   [facility] to [facility] as scheduled.    Review of Systems     Objective:   Physical Exam        Assessment & Plan:

## 2016-06-03 NOTE — Progress Notes (Signed)
Safety precautions to be maintained throughout the outpatient stay will include: orient to surroundings, keep bed in low position, maintain call bell within reach at all times, provide assistance with transfer out of bed and ambulation.  

## 2016-06-04 ENCOUNTER — Telehealth: Payer: Self-pay | Admitting: *Deleted

## 2016-06-04 NOTE — Telephone Encounter (Signed)
Denies complications post procedure. 

## 2016-06-05 ENCOUNTER — Other Ambulatory Visit: Payer: Self-pay

## 2016-06-05 DIAGNOSIS — K2100 Gastro-esophageal reflux disease with esophagitis, without bleeding: Secondary | ICD-10-CM

## 2016-06-05 MED ORDER — DEXLANSOPRAZOLE 60 MG PO CPDR: 60.0000 mg | DELAYED_RELEASE_CAPSULE | Freq: Every day | ORAL | Status: DC

## 2016-06-13 ENCOUNTER — Other Ambulatory Visit: Payer: Self-pay

## 2016-06-13 MED ORDER — VSL#3 PO CAPS: 1.0000 | ORAL_CAPSULE | Freq: Two times a day (BID) | ORAL | Status: DC

## 2016-06-18 ENCOUNTER — Encounter: Payer: Self-pay | Admitting: Pain Medicine

## 2016-06-18 ENCOUNTER — Ambulatory Visit: Payer: Medicare Other | Attending: Pain Medicine | Admitting: Pain Medicine

## 2016-06-18 VITALS — BP 122/67 | HR 50 | Temp 98.4°F | Resp 16 | Ht 65.0 in | Wt 157.0 lb

## 2016-06-18 DIAGNOSIS — M545 Low back pain: Secondary | ICD-10-CM | POA: Diagnosis present

## 2016-06-18 DIAGNOSIS — G588 Other specified mononeuropathies: Secondary | ICD-10-CM | POA: Insufficient documentation

## 2016-06-18 DIAGNOSIS — M5416 Radiculopathy, lumbar region: Secondary | ICD-10-CM

## 2016-06-18 DIAGNOSIS — M79606 Pain in leg, unspecified: Secondary | ICD-10-CM | POA: Diagnosis present

## 2016-06-18 DIAGNOSIS — M5116 Intervertebral disc disorders with radiculopathy, lumbar region: Secondary | ICD-10-CM | POA: Insufficient documentation

## 2016-06-18 DIAGNOSIS — Z9889 Other specified postprocedural states: Secondary | ICD-10-CM | POA: Insufficient documentation

## 2016-06-18 DIAGNOSIS — M160 Bilateral primary osteoarthritis of hip: Secondary | ICD-10-CM | POA: Diagnosis not present

## 2016-06-18 DIAGNOSIS — M7060 Trochanteric bursitis, unspecified hip: Secondary | ICD-10-CM

## 2016-06-18 DIAGNOSIS — M48062 Spinal stenosis, lumbar region with neurogenic claudication: Secondary | ICD-10-CM

## 2016-06-18 DIAGNOSIS — M542 Cervicalgia: Secondary | ICD-10-CM | POA: Diagnosis present

## 2016-06-18 DIAGNOSIS — M5136 Other intervertebral disc degeneration, lumbar region: Secondary | ICD-10-CM

## 2016-06-18 DIAGNOSIS — M4806 Spinal stenosis, lumbar region: Secondary | ICD-10-CM | POA: Diagnosis not present

## 2016-06-18 DIAGNOSIS — M961 Postlaminectomy syndrome, not elsewhere classified: Secondary | ICD-10-CM

## 2016-06-18 DIAGNOSIS — Z96642 Presence of left artificial hip joint: Secondary | ICD-10-CM | POA: Diagnosis not present

## 2016-06-18 DIAGNOSIS — M533 Sacrococcygeal disorders, not elsewhere classified: Secondary | ICD-10-CM | POA: Diagnosis not present

## 2016-06-18 DIAGNOSIS — M62830 Muscle spasm of back: Secondary | ICD-10-CM | POA: Insufficient documentation

## 2016-06-18 DIAGNOSIS — M940 Chondrocostal junction syndrome [Tietze]: Secondary | ICD-10-CM

## 2016-06-18 DIAGNOSIS — E1340 Other specified diabetes mellitus with diabetic neuropathy, unspecified: Secondary | ICD-10-CM

## 2016-06-18 DIAGNOSIS — M47816 Spondylosis without myelopathy or radiculopathy, lumbar region: Secondary | ICD-10-CM

## 2016-06-18 MED ORDER — PREGABALIN 25 MG PO CAPS: ORAL_CAPSULE | ORAL | Status: DC

## 2016-06-18 MED ORDER — OXYCODONE HCL 5 MG PO TABS: ORAL_TABLET | ORAL | Status: DC
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
## 2016-06-18 NOTE — Patient Instructions (Addendum)
PLAN   Continue present medication Lyrica and oxycodone   Femoral and obturator nerve blocks to be performed at time of return appointment  F/U PCP Dr. Lawerance BachBurns for evaliation of  BP GI condition and general medical  Condition  F/U surgical evaluation. Patient will follow-up with Dr.Cram   F/U neurological evaluation. May consider PNCV/EMG studies and other studies pending follow-up evaluations  We will request radiofrequency rhizolysis of femoral and obturator nerves at this time for treatment of your left hip pain  Patient to call [facility] should patient have concerns prior to scheduled return appointmentGENERAL RISKS AND COMPLICATIONS  What are the risk, side effects and possible complications? Generally speaking, most procedures are safe.  However, with any procedure there are risks, side effects, and the possibility of complications.  The risks and complications are dependent upon the sites that are lesioned, or the type of nerve block to be performed.  The closer the procedure is to the spine, the more serious the risks are.  Great care is taken when placing the radio frequency needles, block needles or lesioning probes, but sometimes complications can occur. 1. Infection: Any time there is an injection through the skin, there is a risk of infection.  This is why sterile conditions are used for these blocks.  There are four possible types of infection. 1. Localized skin infection. 2. Central Nervous System Infection-This can be in the form of Meningitis, which can be deadly. 3. Epidural Infections-This can be in the form of an epidural abscess, which can cause pressure inside of the spine, causing compression of the spinal cord with subsequent paralysis. This would require an emergency surgery to decompress, and there are no guarantees that the patient would recover from the paralysis. 4. Discitis-This is an infection of the intervertebral discs.  It occurs in about 1% of  discography procedures.  It is difficult to treat and it may lead to surgery.        2. Pain: the needles have to go through skin and soft tissues, will cause soreness.       3. Damage to internal structures:  The nerves to be lesioned may be near blood vessels or    other nerves which can be potentially damaged.       4. Bleeding: Bleeding is more common if the patient is taking blood thinners such as  aspirin, Coumadin, Ticiid, Plavix, etc., or if he/she have some genetic predisposition  such as hemophilia. Bleeding into the spinal canal can cause compression of the spinal  cord with subsequent paralysis.  This would require an emergency surgery to  decompress and there are no guarantees that the patient would recover from the  paralysis.       5. Pneumothorax:  Puncturing of a lung is a possibility, every time a needle is introduced in  the area of the chest or upper back.  Pneumothorax refers to free air around the  collapsed lung(s), inside of the thoracic cavity (chest cavity).  Another two possible  complications related to a similar event would include: Hemothorax and Chylothorax.   These are variations of the Pneumothorax, where instead of air around the collapsed  lung(s), you may have blood or chyle, respectively.       6. Spinal headaches: They may occur with any procedures in the area of the spine.       7. Persistent CSF (Cerebro-Spinal Fluid) leakage: This is a rare problem, but may occur  with prolonged intrathecal or  epidural catheters either due to the formation of a fistulous  track or a dural tear.       8. Nerve damage: By working so close to the spinal cord, there is always a possibility of  nerve damage, which could be as serious as a permanent spinal cord injury with  paralysis.       9. Death:  Although rare, severe deadly allergic reactions known as "Anaphylactic  reaction" can occur to any of the medications used.      10. Worsening of the symptoms:  We can always make thing  worse.  What are the chances of something like this happening? Chances of any of this occuring are extremely low.  By statistics, you have more of a chance of getting killed in a motor vehicle accident: while driving to the hospital than any of the above occurring .  Nevertheless, you should be aware that they are possibilities.  In general, it is similar to taking a shower.  Everybody knows that you can slip, hit your head and get killed.  Does that mean that you should not shower again?  Nevertheless always keep in mind that statistics do not mean anything if you happen to be on the wrong side of them.  Even if a procedure has a 1 (one) in a 1,000,000 (million) chance of going wrong, it you happen to be that one..Also, keep in mind that by statistics, you have more of a chance of having something go wrong when taking medications.  Who should not have this procedure? If you are on a blood thinning medication (e.g. Coumadin, Plavix, see list of "Blood Thinners"), or if you have an active infection going on, you should not have the procedure.  If you are taking any blood thinners, please inform your physician.  How should I prepare for this procedure?  Do not eat or drink anything at least six hours prior to the procedure.  Bring a driver with you .  It cannot be a taxi.  Come accompanied by an adult that can drive you back, and that is strong enough to help you if your legs get weak or numb from the local anesthetic.  Take all of your medicines the morning of the procedure with just enough water to swallow them.  If you have diabetes, make sure that you are scheduled to have your procedure done first thing in the morning, whenever possible.  If you have diabetes, take only half of your insulin dose and notify our nurse that you have done so as soon as you arrive at the clinic.  If you are diabetic, but only take blood sugar pills (oral hypoglycemic), then do not take them on the morning of your  procedure.  You may take them after you have had the procedure.  Do not take aspirin or any aspirin-containing medications, at least eleven (11) days prior to the procedure.  They may prolong bleeding.  Wear loose fitting clothing that may be easy to take off and that you would not mind if it got stained with Betadine or blood.  Do not wear any jewelry or perfume  Remove any nail coloring.  It will interfere with some of our monitoring equipment.  NOTE: Remember that this is not meant to be interpreted as a complete list of all possible complications.  Unforeseen problems may occur.  BLOOD THINNERS The following drugs contain aspirin or other products, which can cause increased bleeding during surgery and should not be  taken for 2 weeks prior to and 1 week after surgery.  If you should need take something for relief of minor pain, you may take acetaminophen which is found in Tylenol,m Datril, Anacin-3 and Panadol. It is not blood thinner. The products listed below are.  Do not take any of the products listed below in addition to any listed on your instruction sheet.  A.P.C or A.P.C with Codeine Codeine Phosphate Capsules #3 Ibuprofen Ridaura  ABC compound Congesprin Imuran rimadil  Advil Cope Indocin Robaxisal  Alka-Seltzer Effervescent Pain Reliever and Antacid Coricidin or Coricidin-D  Indomethacin Rufen  Alka-Seltzer plus Cold Medicine Cosprin Ketoprofen S-A-C Tablets  Anacin Analgesic Tablets or Capsules Coumadin Korlgesic Salflex  Anacin Extra Strength Analgesic tablets or capsules CP-2 Tablets Lanoril Salicylate  Anaprox Cuprimine Capsules Levenox Salocol  Anexsia-D Dalteparin Magan Salsalate  Anodynos Darvon compound Magnesium Salicylate Sine-off  Ansaid Dasin Capsules Magsal Sodium Salicylate  Anturane Depen Capsules Marnal Soma  APF Arthritis pain formula Dewitt's Pills Measurin Stanback  Argesic Dia-Gesic Meclofenamic Sulfinpyrazone  Arthritis Bayer Timed Release Aspirin  Diclofenac Meclomen Sulindac  Arthritis pain formula Anacin Dicumarol Medipren Supac  Analgesic (Safety coated) Arthralgen Diffunasal Mefanamic Suprofen  Arthritis Strength Bufferin Dihydrocodeine Mepro Compound Suprol  Arthropan liquid Dopirydamole Methcarbomol with Aspirin Synalgos  ASA tablets/Enseals Disalcid Micrainin Tagament  Ascriptin Doan's Midol Talwin  Ascriptin A/D Dolene Mobidin Tanderil  Ascriptin Extra Strength Dolobid Moblgesic Ticlid  Ascriptin with Codeine Doloprin or Doloprin with Codeine Momentum Tolectin  Asperbuf Duoprin Mono-gesic Trendar  Aspergum Duradyne Motrin or Motrin IB Triminicin  Aspirin plain, buffered or enteric coated Durasal Myochrisine Trigesic  Aspirin Suppositories Easprin Nalfon Trillsate  Aspirin with Codeine Ecotrin Regular or Extra Strength Naprosyn Uracel  Atromid-S Efficin Naproxen Ursinus  Auranofin Capsules Elmiron Neocylate Vanquish  Axotal Emagrin Norgesic Verin  Azathioprine Empirin or Empirin with Codeine Normiflo Vitamin E  Azolid Emprazil Nuprin Voltaren  Bayer Aspirin plain, buffered or children's or timed BC Tablets or powders Encaprin Orgaran Warfarin Sodium  Buff-a-Comp Enoxaparin Orudis Zorpin  Buff-a-Comp with Codeine Equegesic Os-Cal-Gesic   Buffaprin Excedrin plain, buffered or Extra Strength Oxalid   Bufferin Arthritis Strength Feldene Oxphenbutazone   Bufferin plain or Extra Strength Feldene Capsules Oxycodone with Aspirin   Bufferin with Codeine Fenoprofen Fenoprofen Pabalate or Pabalate-SF   Buffets II Flogesic Panagesic   Buffinol plain or Extra Strength Florinal or Florinal with Codeine Panwarfarin   Buf-Tabs Flurbiprofen Penicillamine   Butalbital Compound Four-way cold tablets Penicillin   Butazolidin Fragmin Pepto-Bismol   Carbenicillin Geminisyn Percodan   Carna Arthritis Reliever Geopen Persantine   Carprofen Gold's salt Persistin   Chloramphenicol Goody's Phenylbutazone   Chloromycetin Haltrain Piroxlcam    Clmetidine heparin Plaquenil   Cllnoril Hyco-pap Ponstel   Clofibrate Hydroxy chloroquine Propoxyphen         Before stopping any of these medications, be sure to consult the physician who ordered them.  Some, such as Coumadin (Warfarin) are ordered to prevent or treat serious conditions such as "deep thrombosis", "pumonary embolisms", and other heart problems.  The amount of time that you may need off of the medication may also vary with the medication and the reason for which you were taking it.  If you are taking any of these medications, please make sure you notify your pain physician before you undergo any procedures.

## 2016-06-18 NOTE — Progress Notes (Signed)
Safety precautions to be maintained throughout the outpatient stay will include: orient to surroundings, keep bed in low position, maintain call bell within reach at all times, provide assistance with transfer out of bed and ambulation.  

## 2016-06-26 ENCOUNTER — Encounter: Payer: Self-pay | Admitting: Gastroenterology

## 2016-06-26 ENCOUNTER — Ambulatory Visit (INDEPENDENT_AMBULATORY_CARE_PROVIDER_SITE_OTHER): Payer: Medicare Other | Admitting: Gastroenterology

## 2016-06-26 VITALS — BP 137/71 | HR 54 | Temp 99.0°F | Ht 65.0 in | Wt 166.0 lb

## 2016-06-26 DIAGNOSIS — K582 Mixed irritable bowel syndrome: Secondary | ICD-10-CM

## 2016-06-26 MED ORDER — DICYCLOMINE HCL 20 MG PO TABS: 20.0000 mg | ORAL_TABLET | Freq: Three times a day (TID) | ORAL | Status: DC

## 2016-06-26 NOTE — Progress Notes (Signed)
Primary Care Physician: Hyman HopesBurns, Harriett P, MD  Primary Gastroenterologist:  Dr. Midge Miniumarren Labrina Lines  Chief Complaint  Patient presents with  . Rectal Bleeding    HPI: Yvette Collier is a 75 y.o. female here For follow-up of irritable bowel syndrome. The patient was in the hospital with rectal bleeding. The patient was thought to have ischemic colitis since her last colonoscopy showed inflammation that may have been caused by ischemia. The patient reports that she is having abdominal pain when she is constipated but does not have any abdominal pain when she has her diarrhea. The patient has been having alternating diarrhea and constipation and irritable bowel syndrome for some time. The patient also has abdominal cramps with her constipation. The patient denies any rectal bleeding except when she has a constipation. She has been playing with her medications to decrease the ones that make her more constipated but it usually results in her having diarrhea. The patient does state that the dicyclomine twice a day has been helping her.  Current Outpatient Prescriptions  Medication Sig Dispense Refill  . aspirin EC 81 MG tablet Take 81 mg by mouth daily.    . cefUROXime (CEFTIN) 250 MG tablet Take 1 tablet (250 mg total) by mouth 2 (two) times daily with a meal. 14 tablet 0  . cetirizine (ZYRTEC) 10 MG tablet Take 10 mg by mouth daily.    . cholestyramine light (PREVALITE) 4 g packet Take 2 g by mouth 2 (two) times daily.    Marland Kitchen. dexlansoprazole (DEXILANT) 60 MG capsule Take 1 capsule (60 mg total) by mouth daily. 30 capsule 11  . diltiazem (CARDIZEM CD) 120 MG 24 hr capsule Take 1 capsule (120 mg total) by mouth daily after breakfast. 90 capsule 3  . glipiZIDE (GLUCOTROL XL) 2.5 MG 24 hr tablet Take 2.5 mg by mouth daily with breakfast.    . losartan (COZAAR) 25 MG tablet Take 25 mg by mouth daily.     . metoprolol succinate (TOPROL-XL) 50 MG 24 hr tablet Take 1 tablet (50 mg total) by mouth every evening.  Take with or immediately following a meal. 90 tablet 3  . oxyCODONE (OXY IR/ROXICODONE) 5 MG immediate release tablet Limit 1 tablet by mouth 2-3 times per day if tolerated 90 tablet 0  . pregabalin (LYRICA) 25 MG capsule Limit  1 tablet by mouth per day or twice per day if tolerated 60 capsule 0  . Probiotic Product (VSL#3) CAPS Take 1 capsule by mouth 2 (two) times daily. 60 capsule 11  . ranitidine (ZANTAC) 150 MG tablet Take 1 tablet (150 mg total) by mouth 2 (two) times daily. 60 tablet 3  . simvastatin (ZOCOR) 10 MG tablet Take 10 mg by mouth every evening.     . dicyclomine (BENTYL) 20 MG tablet Take 1 tablet (20 mg total) by mouth 3 (three) times daily before meals. 90 tablet 6   Current Facility-Administered Medications  Medication Dose Route Frequency Provider Last Rate Last Dose  . ceFAZolin (ANCEF) IVPB 1 g/50 mL premix  1 g Intravenous Once Ewing SchleinGregory Crisp, MD        Allergies as of 06/26/2016 - Review Complete 06/26/2016  Allergen Reaction Noted  . Effexor [venlafaxine] Hives 07/14/2013  . Ivp dye [iodinated diagnostic agents] Hives 07/14/2013  . Lisinopril Swelling and Other (See Comments) 07/13/2015  . Sulfa antibiotics Hives 07/14/2013    ROS:  General: Negative for anorexia, weight loss, fever, chills, fatigue, weakness. ENT: Negative for hoarseness, difficulty swallowing ,  nasal congestion. CV: Negative for chest pain, angina, palpitations, dyspnea on exertion, peripheral edema.  Respiratory: Negative for dyspnea at rest, dyspnea on exertion, cough, sputum, wheezing.  GI: See history of present illness. GU:  Negative for dysuria, hematuria, urinary incontinence, urinary frequency, nocturnal urination.  Endo: Negative for unusual weight change.    Physical Examination:   BP 137/71 mmHg  Pulse 54  Temp(Src) 99 F (37.2 C) (Oral)  Ht 5\' 5"  (1.651 m)  Wt 166 lb (75.297 kg)  BMI 27.62 kg/m2  General: Well-nourished, well-developed in no acute distress.  Eyes: No  icterus. Conjunctivae pink. Mouth: Oropharyngeal mucosa moist and pink , no lesions erythema or exudate. Lungs: Clear to auscultation bilaterally. Non-labored. Heart: Regular rate and rhythm, no murmurs rubs or gallops.  Abdomen: Bowel sounds are normal, Diffusely mildly tender, nondistended, no hepatosplenomegaly or masses, no abdominal bruits or hernia , no rebound or guarding.   Extremities: No lower extremity edema. No clubbing or deformities. Neuro: Alert and oriented x 3.  Grossly intact. Skin: Warm and dry, no jaundice.   Psych: Alert and cooperative, normal mood and affect.  Labs:    Imaging Studies: No results found.  Assessment and Plan:   Yvette RakersMary L Key is a 75 y.o. y/o female with a history of irritable bowel syndrome with admissions to the hospital with rectal bleeding. It was thought the patient may be having ischemic colitis. The patient now reports that she only has rectal bleeding with constipation and abdominal pain when she has the Patient. The patient will increase her dicyclomine to 3 times a day. She'll also work on backing off her medications that make her constipated thereby giving her more loose bowel movements. The patient states she does not have pain when she has loose bowel movements that she doesn't have any rectal bleeding with the loose bowel habits. The patient will contact me if her symptoms do not improve.   Note: This dictation was prepared with Dragon dictation along with smaller phrase technology. Any transcriptional errors that result from this process are unintentional.

## 2016-06-28 ENCOUNTER — Ambulatory Visit: Payer: Medicare Other | Admitting: Cardiovascular Disease

## 2016-07-10 ENCOUNTER — Encounter: Payer: Self-pay | Admitting: Pain Medicine

## 2016-07-10 ENCOUNTER — Ambulatory Visit: Payer: Medicare Other | Attending: Pain Medicine | Admitting: Pain Medicine

## 2016-07-10 VITALS — BP 140/74 | HR 45 | Temp 98.8°F | Resp 14 | Ht 65.0 in | Wt 166.0 lb

## 2016-07-10 DIAGNOSIS — M160 Bilateral primary osteoarthritis of hip: Secondary | ICD-10-CM

## 2016-07-10 DIAGNOSIS — M940 Chondrocostal junction syndrome [Tietze]: Secondary | ICD-10-CM

## 2016-07-10 DIAGNOSIS — M48062 Spinal stenosis, lumbar region with neurogenic claudication: Secondary | ICD-10-CM

## 2016-07-10 DIAGNOSIS — M7060 Trochanteric bursitis, unspecified hip: Secondary | ICD-10-CM

## 2016-07-10 DIAGNOSIS — Z96642 Presence of left artificial hip joint: Secondary | ICD-10-CM | POA: Insufficient documentation

## 2016-07-10 DIAGNOSIS — G588 Other specified mononeuropathies: Secondary | ICD-10-CM

## 2016-07-10 DIAGNOSIS — M5416 Radiculopathy, lumbar region: Secondary | ICD-10-CM

## 2016-07-10 DIAGNOSIS — M47816 Spondylosis without myelopathy or radiculopathy, lumbar region: Secondary | ICD-10-CM

## 2016-07-10 DIAGNOSIS — E1340 Other specified diabetes mellitus with diabetic neuropathy, unspecified: Secondary | ICD-10-CM

## 2016-07-10 DIAGNOSIS — M533 Sacrococcygeal disorders, not elsewhere classified: Secondary | ICD-10-CM

## 2016-07-10 DIAGNOSIS — Z9889 Other specified postprocedural states: Secondary | ICD-10-CM

## 2016-07-10 DIAGNOSIS — M25552 Pain in left hip: Secondary | ICD-10-CM | POA: Insufficient documentation

## 2016-07-10 DIAGNOSIS — M5136 Other intervertebral disc degeneration, lumbar region: Secondary | ICD-10-CM

## 2016-07-10 DIAGNOSIS — M961 Postlaminectomy syndrome, not elsewhere classified: Secondary | ICD-10-CM

## 2016-07-10 MED ORDER — MIDAZOLAM HCL 5 MG/5ML IJ SOLN
5.0000 mg | Freq: Once | INTRAMUSCULAR | Status: AC
  Administered 2016-07-10: 3 mg via INTRAVENOUS

## 2016-07-10 MED ORDER — BUPIVACAINE HCL (PF) 0.25 % IJ SOLN
30.0000 mL | Freq: Once | INTRAMUSCULAR | Status: AC
  Administered 2016-07-10: 30 mL

## 2016-07-10 MED ORDER — CEFAZOLIN SODIUM 1 G IJ SOLR
INTRAMUSCULAR | Status: AC
  Administered 2016-07-10: 1 g via INTRAVENOUS
  Filled 2016-07-10: qty 10

## 2016-07-10 MED ORDER — IOPAMIDOL (ISOVUE-M 200) INJECTION 41%
INTRAMUSCULAR | Status: AC
  Filled 2016-07-10: qty 10

## 2016-07-10 MED ORDER — CEFUROXIME AXETIL 250 MG PO TABS: 250.0000 mg | ORAL_TABLET | Freq: Two times a day (BID) | ORAL | Status: DC

## 2016-07-10 MED ORDER — LIDOCAINE HCL (PF) 1 % IJ SOLN
10.0000 mL | Freq: Once | INTRAMUSCULAR | Status: AC
  Administered 2016-07-10: 10 mL via SUBCUTANEOUS

## 2016-07-10 MED ORDER — ORPHENADRINE CITRATE 30 MG/ML IJ SOLN
INTRAMUSCULAR | Status: AC
  Filled 2016-07-10: qty 2

## 2016-07-10 MED ORDER — MIDAZOLAM HCL 5 MG/5ML IJ SOLN
INTRAMUSCULAR | Status: AC
  Administered 2016-07-10: 3 mg via INTRAVENOUS
  Filled 2016-07-10: qty 5

## 2016-07-10 MED ORDER — LACTATED RINGERS IV SOLN: 1000.0000 mL | INTRAVENOUS | Status: DC

## 2016-07-10 MED ORDER — TRIAMCINOLONE ACETONIDE 40 MG/ML IJ SUSP
INTRAMUSCULAR | Status: AC
  Administered 2016-07-10: 40 mg
  Filled 2016-07-10: qty 1

## 2016-07-10 MED ORDER — CEFAZOLIN IN D5W 1 GM/50ML IV SOLN: 1.0000 g | Freq: Once | INTRAVENOUS | Status: DC

## 2016-07-10 MED ORDER — ORPHENADRINE CITRATE 30 MG/ML IJ SOLN: 60.0000 mg | Freq: Once | INTRAMUSCULAR | Status: DC

## 2016-07-10 MED ORDER — FENTANYL CITRATE (PF) 100 MCG/2ML IJ SOLN
100.0000 ug | Freq: Once | INTRAMUSCULAR | Status: AC
  Administered 2016-07-10: 50 ug via INTRAVENOUS

## 2016-07-10 MED ORDER — BUPIVACAINE HCL (PF) 0.25 % IJ SOLN
INTRAMUSCULAR | Status: AC
  Administered 2016-07-10: 30 mL
  Filled 2016-07-10: qty 30

## 2016-07-10 MED ORDER — LIDOCAINE HCL (PF) 1 % IJ SOLN
INTRAMUSCULAR | Status: AC
  Administered 2016-07-10: 10 mL via SUBCUTANEOUS
  Filled 2016-07-10: qty 10

## 2016-07-10 MED ORDER — TRIAMCINOLONE ACETONIDE 40 MG/ML IJ SUSP
40.0000 mg | Freq: Once | INTRAMUSCULAR | Status: AC
  Administered 2016-07-10: 40 mg

## 2016-07-10 MED ORDER — FENTANYL CITRATE (PF) 100 MCG/2ML IJ SOLN
INTRAMUSCULAR | Status: AC
  Administered 2016-07-10: 50 ug via INTRAVENOUS
  Filled 2016-07-10: qty 2
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
## 2016-07-10 NOTE — Progress Notes (Signed)
   Subjective:    Patient ID: Yvette RakersMary L Collier, female    DOB: Mar 12, 1941, 75 y.o.   MRN: [id number]  HPI                              FEMORAL AND OBTURATOR NERVE BLOCKS FOR LEFT HIP PAIN    The patient is a 89 -year-old female who returns to pain management for further evaluation and treatment of severely debilitating left hip pain.  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 Decision has been made to proceed with block of the articular branches of the femoral and obturator nerves to decrease the severity of patient's symptoms, minimize progression of patient's symptoms, and avoid the need for more involved treatment. The risks, benefits, and expectations of the procedure have been discussed with and explained to the patient who is with understanding and is in agreement to proceed with block of the articular branches of the femoral and obturator nerves to treat pain of the hip. All are in agreement with suggested treatment plan.      DESCRIPTION OF PROCEDURE: Femoral And Obturator Articular Branch Nerve Blocks    The patient was taken to fluoroscopy suite and assumed the supine position. EKG, blood pressure, pulse, pulse oximetry, and capnograph monitors were all in place. IV Versed and IV fentanyl conscious sedation was obtained. Betadine prep of proposed needle entry sites was accomplished.  Block Of The Articular Branch Of Femoral Nerve   Under fluoroscopic guidance a local anesthetic skin wheal of 1.5% preservative-free lidocaine was accomplished at the proposed needle entry site for needle entry for block of the articular branch of the femoral nerve. Under fluoroscopic guidance a 22-gauge needle was inserted at the 12:00 position of the acetabulum and slightly superior to the acetabular rim. Following documentation of accurate needle placement, 1 ml of 0.25% bupivacaine with Kenalog was injected for block of the articular branch of the femoral  nerve.  Block Of The Articular Branch Of Obturator Nerve  Under fluoroscopic guidance a local anesthetic skin wheal of 1.5% preservative-free lidocaine was accomplished at the proposed needle entry site for needle entry for block of the articular branch of the obturator nerve. Under fluoroscopic guidance a 22-gauge needle was inserted at the incisura of the acetabulum until contact of bone was accomplished.(The needle was placed in the region known as the teardrop formation).  Following documentation of accurate needle placement, 1 ml of 0.25% bupivacaine with Kenalog was injected. the 22-gauge needle.  A total of 10 mg of Kenalog was utilized for the procedure   The patient tolerated the procedure well    PLAN  Continue present medications Lyrica and oxycodone  F/U PCP Dr. Lawerance BachBurns for evaliation of  BP and general medical  condition  F/U surgical evaluation.. Patient will undergo further surgical evaluation as discussed   F/U neurological evaluation. Has been addressed. We will avoid further neurological studies at this time.   May consider radiofrequency rhizolysis or intraspinal procedures pending response to present treatment and F/U evaluation   Patient to call Pain Management Center should patient have concerns prior to scheduled return appointment.    Review of Systems     Objective:   Physical Exam        Assessment & Plan:

## 2016-07-10 NOTE — Progress Notes (Signed)
Safety precautions to be maintained throughout the outpatient stay will include: orient to surroundings, keep bed in low position, maintain call bell within reach at all times, provide assistance with transfer out of bed and ambulation.  

## 2016-07-11 ENCOUNTER — Telehealth: Payer: Self-pay | Admitting: *Deleted

## 2016-07-11 NOTE — Telephone Encounter (Signed)
Unable to reach patient.  No voicemail capability

## 2016-07-18 ENCOUNTER — Encounter: Payer: Self-pay | Admitting: Cardiovascular Disease

## 2016-07-18 ENCOUNTER — Ambulatory Visit (INDEPENDENT_AMBULATORY_CARE_PROVIDER_SITE_OTHER): Payer: Medicare Other | Admitting: Cardiovascular Disease

## 2016-07-18 ENCOUNTER — Ambulatory Visit: Payer: Medicare Other | Attending: Pain Medicine | Admitting: Pain Medicine

## 2016-07-18 ENCOUNTER — Encounter: Payer: Self-pay | Admitting: Pain Medicine

## 2016-07-18 VITALS — BP 154/64 | HR 41 | Temp 98.4°F | Resp 15 | Ht 65.0 in | Wt 163.0 lb

## 2016-07-18 VITALS — BP 152/80 | HR 53 | Ht 65.0 in | Wt 163.0 lb

## 2016-07-18 DIAGNOSIS — M62830 Muscle spasm of back: Secondary | ICD-10-CM | POA: Diagnosis not present

## 2016-07-18 DIAGNOSIS — M5116 Intervertebral disc disorders with radiculopathy, lumbar region: Secondary | ICD-10-CM | POA: Insufficient documentation

## 2016-07-18 DIAGNOSIS — M545 Low back pain: Secondary | ICD-10-CM | POA: Diagnosis present

## 2016-07-18 DIAGNOSIS — G588 Other specified mononeuropathies: Secondary | ICD-10-CM | POA: Diagnosis not present

## 2016-07-18 DIAGNOSIS — M160 Bilateral primary osteoarthritis of hip: Secondary | ICD-10-CM

## 2016-07-18 DIAGNOSIS — M47816 Spondylosis without myelopathy or radiculopathy, lumbar region: Secondary | ICD-10-CM

## 2016-07-18 DIAGNOSIS — Z9889 Other specified postprocedural states: Secondary | ICD-10-CM | POA: Diagnosis not present

## 2016-07-18 DIAGNOSIS — M5136 Other intervertebral disc degeneration, lumbar region: Secondary | ICD-10-CM

## 2016-07-18 DIAGNOSIS — M179 Osteoarthritis of knee, unspecified: Secondary | ICD-10-CM | POA: Insufficient documentation

## 2016-07-18 DIAGNOSIS — M1710 Unilateral primary osteoarthritis, unspecified knee: Secondary | ICD-10-CM | POA: Insufficient documentation

## 2016-07-18 DIAGNOSIS — M546 Pain in thoracic spine: Secondary | ICD-10-CM | POA: Diagnosis present

## 2016-07-18 DIAGNOSIS — I480 Paroxysmal atrial fibrillation: Secondary | ICD-10-CM

## 2016-07-18 DIAGNOSIS — M533 Sacrococcygeal disorders, not elsewhere classified: Secondary | ICD-10-CM | POA: Diagnosis not present

## 2016-07-18 DIAGNOSIS — M47817 Spondylosis without myelopathy or radiculopathy, lumbosacral region: Secondary | ICD-10-CM

## 2016-07-18 DIAGNOSIS — M48062 Spinal stenosis, lumbar region with neurogenic claudication: Secondary | ICD-10-CM

## 2016-07-18 DIAGNOSIS — M51369 Other intervertebral disc degeneration, lumbar region without mention of lumbar back pain or lower extremity pain: Secondary | ICD-10-CM

## 2016-07-18 DIAGNOSIS — M940 Chondrocostal junction syndrome [Tietze]: Secondary | ICD-10-CM | POA: Insufficient documentation

## 2016-07-18 DIAGNOSIS — M170 Bilateral primary osteoarthritis of knee: Secondary | ICD-10-CM | POA: Diagnosis not present

## 2016-07-18 DIAGNOSIS — M5416 Radiculopathy, lumbar region: Secondary | ICD-10-CM

## 2016-07-18 DIAGNOSIS — E1340 Other specified diabetes mellitus with diabetic neuropathy, unspecified: Secondary | ICD-10-CM

## 2016-07-18 DIAGNOSIS — Z96642 Presence of left artificial hip joint: Secondary | ICD-10-CM | POA: Insufficient documentation

## 2016-07-18 MED ORDER — PREGABALIN 25 MG PO CAPS: ORAL_CAPSULE | ORAL | 0 refills | Status: DC

## 2016-07-18 MED ORDER — OXYCODONE HCL 5 MG PO TABS: ORAL_TABLET | ORAL | 0 refills | Status: DC
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
## 2016-07-18 NOTE — Progress Notes (Signed)
Patient came in for visit EKG performed, Vitals taken Patient was rescheduled as doctor was doing a procedure in the hospital    Review of Systems  Not obtained
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
## 2016-07-22 ENCOUNTER — Ambulatory Visit (INDEPENDENT_AMBULATORY_CARE_PROVIDER_SITE_OTHER): Payer: Medicare Other | Admitting: Physician Assistant

## 2016-07-22 ENCOUNTER — Encounter: Payer: Self-pay | Admitting: Physician Assistant

## 2016-07-22 VITALS — BP 120/80 | HR 47 | Ht 65.0 in | Wt 163.8 lb

## 2016-07-22 DIAGNOSIS — D508 Other iron deficiency anemias: Secondary | ICD-10-CM

## 2016-07-22 DIAGNOSIS — I119 Hypertensive heart disease without heart failure: Secondary | ICD-10-CM

## 2016-07-22 DIAGNOSIS — I2510 Atherosclerotic heart disease of native coronary artery without angina pectoris: Secondary | ICD-10-CM | POA: Diagnosis not present

## 2016-07-22 DIAGNOSIS — R001 Bradycardia, unspecified: Secondary | ICD-10-CM | POA: Diagnosis not present

## 2016-07-22 DIAGNOSIS — K922 Gastrointestinal hemorrhage, unspecified: Secondary | ICD-10-CM

## 2016-07-22 DIAGNOSIS — K559 Vascular disorder of intestine, unspecified: Secondary | ICD-10-CM

## 2016-07-22 DIAGNOSIS — I480 Paroxysmal atrial fibrillation: Secondary | ICD-10-CM | POA: Diagnosis not present

## 2016-07-22 DIAGNOSIS — J42 Unspecified chronic bronchitis: Secondary | ICD-10-CM

## 2016-07-22 MED ORDER — METOPROLOL SUCCINATE ER 25 MG PO TB24: 25.0000 mg | ORAL_TABLET | Freq: Every evening | ORAL | 3 refills | Status: DC

## 2016-07-22 NOTE — Progress Notes (Signed)
Cardiology Office Note Date:  07/22/2016  Patient ID:  Yvette Collier, Yvette Collier 03/24/1941, MRN [id number] PCP:  Hyman Hopes, MD  Cardiologist:  Dr. Mariah Milling, MD    Chief Complaint: SOB  History of Present Illness: Yvette Collier is a 75 y.o. female with history of nonobstructive CAD by cardiac cath 05/2009 at [facility], [place] on rate controlling medications with history of fatigue leading to decreased dose of BB that resolved her fatigue not on longterm full-dose anticoagulation given GI bleeding in the 1980's 09/2014 and most recently 05/2016, ischemic colitis coinciding with Afib, hypertensive heart disease, COPD, DM2, anemia, back pain s/p surgery x 2, left hip replacement, and IBS-D/C who presents for evaluation of SOB.   Prior Holter monitor in 05/2009 showed a predominant sinus rhythm with heart rate ranging from 38-114 bpm with an average heart rate of 60 bpm. The longest R-R interval was 2.1 seconds. First degree AV block was noted. Rare PVCs (3 total), occasional PACs (39 total). Her symptoms diary did not correlate with any arrhythmic activity. Artifact was noted. Cardia cath 05/2009 showed midl CAD, LVEDP 14 mm Hg, right dominant system, normal LV systolic function at 59%. Echo from 06/2011 showed EF 60-65%, LVH, midl MR, dilated LA, normal RV systolic function, mild PR, mild pulmonary hypertension.   Admitted to [facility] 9/24-10/02/2014 for diarrhea and segmental ischemic colitis. Also noted to have Afib during this admission with self conversion to sinus rhythm. Echo from 09/23/2014 in the setting of her ischemic colitis and Afib showed an EF of 60-65%, DD, normal RV size and systolic function, normal PASP. Lytes were ok at that time. It was felt her Afib was 2/2 abdominal pain, and fluids. She was not started on anticoagulation given GI bleed. CHADS2VASc at least 5 (HTN, age x 2, DM, sex category). She has most recently been admitted to [facility] in June 2017 for GI bleed with BRBPR felt to be 2/2 ischemic  colitis. CT abdomen/pelvis at that time showed no acute pathology. She was seen by GI who did not recommned invasive work up. EKG was not performed. Telemetry strips from this admission show sinus rhythm. She was discharged with a stable hgb. Seen by GI in follow up with reports of GI bleed only when constipated.   Today she states several days ago she had an episode of SOB while laying. None since. She has stable longstanding 2-pillow orthopnea. She has never had exertional SOB and has never had chest pain. She denies any early satiety, LE edema, or increased cough. Weight stable at 163 pounds. She is also concerned about her bradycardic heart rates in the 40's bpm. In the office today her heart rate is 47 bpm in sinus bradycardia. She reports a heart rate of 42 bpm the prior week at her PCP office. At GI on 06/26/16 her heart rate was 54 bpm. She has previously had to decrease her Toprol 2/2 fatigue and bradycardia.    Past Medical History:  Diagnosis Date  . Asthma   . Chronic lower back pain   . COPD (chronic obstructive pulmonary disease) (HCC)   . Diabetes mellitus without complication (HCC)   . Gastrointestinal bleed   . GERD (gastroesophageal reflux disease)   . History of colon polyps   . Hypertension   . Hypertensive heart disease   . IBS (irritable bowel syndrome)   . Ischemic colitis (HCC)   . Non-obstructive Coronary Artery Disease    a. 05/2009 [facility]): minimal nonobs dzs.  Marland Kitchen  Osteoarthritis   . PAF (paroxysmal atrial fibrillation) (HCC)    a. 09/2014 during GI illness;  b. CHA2DS2VASc = 5 (not currently on OAC 2/2 h/o GIB/ischemic colitis); c. 09/2014 Echo: EF 60-65%, imparied relaxation, nl RV size/fxn.  . Transient cerebral ischemia due to atrial fibrillation [facility])     Past Surgical History:  Procedure Laterality Date  . ABDOMINAL HYSTERECTOMY    . APPENDECTOMY    . BACK SURGERY     x 3, last 2004.  . CHOLECYSTECTOMY    . FOOT SURGERY Right   . HEMORRHOID BANDING      . left total hip arthroplasty  2012  . STOMACH SURGERY    . TONSILLECTOMY      Current Outpatient Prescriptions  Medication Sig Dispense Refill  . aspirin EC 81 MG tablet Take 81 mg by mouth daily.    . cetirizine (ZYRTEC) 10 MG tablet Take 10 mg by mouth daily.    . cholestyramine light (PREVALITE) 4 g packet Take 2 g by mouth 2 (two) times daily.    Marland Kitchen dexlansoprazole (DEXILANT) 60 MG capsule Take 1 capsule (60 mg total) by mouth daily. 30 capsule 11  . dicyclomine (BENTYL) 20 MG tablet Take 1 tablet (20 mg total) by mouth 3 (three) times daily before meals. 90 tablet 6  . diltiazem (CARDIZEM CD) 120 MG 24 hr capsule Take 1 capsule (120 mg total) by mouth daily after breakfast. 90 capsule 3  . glipiZIDE (GLUCOTROL XL) 2.5 MG 24 hr tablet Take 2.5 mg by mouth daily with breakfast.    . losartan (COZAAR) 25 MG tablet Take 25 mg by mouth daily.     . metoprolol succinate (TOPROL-XL) 50 MG 24 hr tablet Take 1 tablet (50 mg total) by mouth every evening. Take with or immediately following a meal. 90 tablet 3  . oxyCODONE (OXY IR/ROXICODONE) 5 MG immediate release tablet Limit 1 tablet by mouth 2-3 times per day if tolerated 90 tablet 0  . pregabalin (LYRICA) 25 MG capsule Limit  1 tablet by mouth per day or twice per day if tolerated 60 capsule 0  . Probiotic Product (VSL#3) CAPS Take 1 capsule by mouth 2 (two) times daily. 60 capsule 11  . ranitidine (ZANTAC) 150 MG tablet Take 1 tablet (150 mg total) by mouth 2 (two) times daily. 60 tablet 3  . simvastatin (ZOCOR) 10 MG tablet Take 10 mg by mouth every evening.      Current Facility-Administered Medications  Medication Dose Route Frequency Provider Last Rate Last Dose  . ceFAZolin (ANCEF) IVPB 1 g/50 mL premix  1 g Intravenous Once Ewing Schlein, MD      . ceFAZolin (ANCEF) IVPB 1 g/50 mL premix  1 g Intravenous Once Ewing Schlein, MD      . lactated ringers infusion 1,000 mL  1,000 mL Intravenous Continuous Ewing Schlein, MD      .  orphenadrine (NORFLEX) injection 60 mg  60 mg Intramuscular Once Ewing Schlein, MD        Allergies:   Effexor [venlafaxine]; Ivp dye [iodinated diagnostic agents]; Lisinopril; and Sulfa antibiotics   Social History:  The patient  reports that she has never smoked. She has never used smokeless tobacco. She reports that she does not drink alcohol or use drugs.   Family History:  The patient's family history includes Heart attack in her mother; Heart disease in her father; Hypertension in her brother, father, maternal aunt, and mother.  ROS:   Review of Systems  Constitutional: Positive for malaise/fatigue. Negative for chills, diaphoresis, fever and weight loss.  HENT: Negative for congestion.   Eyes: Negative for discharge and redness.  Respiratory: Positive for shortness of breath. Negative for cough, hemoptysis, sputum production and wheezing.   Cardiovascular: Negative for chest pain, palpitations, orthopnea, claudication, leg swelling and PND.  Gastrointestinal: Negative for abdominal pain, heartburn, nausea and vomiting.  Musculoskeletal: Negative for falls and myalgias.  Skin: Negative for rash.  Neurological: Positive for weakness. Negative for dizziness, tingling, tremors, sensory change, speech change, focal weakness and loss of consciousness.  Endo/Heme/Allergies: Does not bruise/bleed easily.  Psychiatric/Behavioral: Negative for substance abuse. The patient is not nervous/anxious.   All other systems reviewed and are negative.    PHYSICAL EXAM:  VS:  BP 120/80 (BP Location: Left Arm, Patient Position: Sitting, Cuff Size: Normal)   Pulse (!) 47   Ht  (1.651 m)   Wt 163 lb 12.8 oz (74.3 kg)   BMI 27.26 kg/m  BMI: Body mass index is 27.26 kg/m.  Physical Exam  Constitutional: She is oriented to person, place, and time. She appears well-developed and well-nourished.  HENT:  Head: Normocephalic and atraumatic.  Eyes: Right eye exhibits no discharge. Left eye exhibits  no discharge.  Neck: Normal range of motion. No JVD present.  Cardiovascular: Regular rhythm, S1 normal, S2 normal and normal heart sounds.  Bradycardia present.  Exam reveals no distant heart sounds, no friction rub, no midsystolic click and no opening snap.   No murmur heard. Pulmonary/Chest: Effort normal and breath sounds normal. No respiratory distress. She has no decreased breath sounds. She has no wheezes. She has no rales. She exhibits no tenderness.  Abdominal: Soft. She exhibits no distension. There is no tenderness.  Musculoskeletal: She exhibits no edema.  Neurological: She is alert and oriented to person, place, and time.  Skin: Skin is warm and dry. No cyanosis. Nails show no clubbing.  Psychiatric: She has a normal mood and affect. Her speech is normal and behavior is normal. Judgment and thought content normal.     EKG:  Was ordered and interpreted by me today. Shows marked sinus bradycardia, 47 bpm, 1st degree AB block, low voltage QRS, no acute st/t changes.   Recent Labs: 05/24/2016: ALT 13 05/25/2016: BUN 16; Creatinine, Ser 0.93; Hemoglobin 11.5; Platelets 298; Potassium 4.3; Sodium 139  No results found for requested labs within last 8760 hours.   CrCl cannot be calculated (Patient's most recent lab result is older than the maximum 21 days allowed.).   Wt Readings from Last 3 Encounters:  07/22/16 163 lb 12.8 oz (74.3 kg)  07/18/16 163 lb (73.9 kg)  07/18/16 163 lb (73.9 kg)     Other studies reviewed: Additional studies/records reviewed today include: summarized above  ASSESSMENT AND PLAN:  1. PAF: Currently in sinus rhythm with a bradycardic heart rate of 47 bpm. Decrease Toprol XL to 25 mg daily. Continue Cardizem CD 120 mg daily. Not on full-dose anticoagulation despite her CHADS2VASc of 5 (HTN, age x 2, DM, sex category) given her history of multiple GI bleeds. Defer to primary cardiologist. On low-dose ASA 81 mg daily.    2. Asymptomatic bradycardia: Heart  rate of 47 bpm in the office today and she reports a heart rate of 42 bpm at PCP. While at GI office on 06/26/16 her heart rate was 54 bpm. Decrease Toprol XL to 25 mg daily. Check bmet and tsh. Continue Cardizem CD 120 mg daily.  3. SOB: One  episode several nights ago while laying down. None since. No exertional SOB. Never with chest pain. Check echo and Lexiscan Myoview. May be related to her bradycardia/BB, COPD, and deconditioning.   4. Nonobstructive CAD by cardiac cath 05/2009: No chest pain. Schedule Lexiscan as above. Continue ASA 81 mg daily. Toprol XL as above. Continue statin.   5. GI bleed/ischemic colitis: No episodes since her admission in June 2017. Not on full-dose anticoagulation given her history of bleeding. Per GI.   6. Hypertensive heart disease: BP well controlled. Continue medications as above with decrease in Toprol XL to 25 mg daily as above given bradycardia with heart rates in the 40's bpm.  7. COPD: Stable. No active exacerbation. Per PCP.   8. Anemia: Per PCP.   Disposition: F/u with me in 1 month.   Current medicines are reviewed at length with the patient today.  The patient did not have any concerns regarding medicines.  Yvette Dodge PA-C 07/22/2016 3:14 PM     [facility] [address] [place], [place] [address] [phone]

## 2016-07-22 NOTE — Patient Instructions (Addendum)
Medication Instructions:  Please decrease your Toprol to 25 mg once daily  Labwork: BMET, TSH  Testing/Procedures: Your physician has requested that you have an echocardiogram. Echocardiography is a painless test that uses sound waves to create images of your heart. It provides your doctor with information about the size and shape of your heart and how well your heart's chambers and valves are working. This procedure takes approximately one hour. There are no restrictions for this procedure.   Date & time: ___________________  [facility]  Your caregiver has ordered a Stress Test with nuclear imaging. The purpose of this test is to evaluate the blood supply to your heart muscle. This procedure is referred to as a "Non-Invasive Stress Test." This is because other than having an IV started in your vein, nothing is inserted or "invades" your body. Cardiac stress tests are done to find areas of poor blood flow to the heart by determining the extent of coronary artery disease (CAD). Some patients exercise on a treadmill, which naturally increases the blood flow to your heart, while others who are  unable to walk on a treadmill due to physical limitations have a pharmacologic/chemical stress agent called Lexiscan . This medicine will mimic walking on a treadmill by temporarily increasing your coronary blood flow.   Please note: these test may take anywhere between 2-4 hours to complete  PLEASE REPORT TO [facility] MEDICAL MALL ENTRANCE  THE VOLUNTEERS AT THE FIRST DESK WILL DIRECT YOU WHERE TO GO  Date of Procedure:______Monday, August 7____  Arrival Time for Procedure:____7:15 am_________  Instructions regarding medication:   __X__ : Hold diabetes medication morning of procedure  __X__:  Hold METOPROLOL the  night before and morning of procedure   How to prepare for your Myoview test:  1. Do not eat or drink after midnight 2. No caffeine for 24 hours prior to test 3. No smoking 24 hours  prior to test. 4. Your medication may be taken with water.  If your doctor stopped a medication because of this test, do not take that medication. 5. Ladies, please do not wear dresses.  Skirts or pants are appropriate. Please wear a short sleeve shirt. 6. No perfume, cologne or lotion.  Follow-Up: 1 month  If you need a refill on your cardiac medications before your next appointment, please call your pharmacy.  Echocardiogram An echocardiogram, or echocardiography, uses sound waves (ultrasound) to produce an image of your heart. The echocardiogram is simple, painless, obtained within a short period of time, and offers valuable information to your health care provider. The images from an echocardiogram can provide information such as:  Evidence of coronary artery disease (CAD).  Heart size.  Heart muscle function.  Heart valve function.  Aneurysm detection.  Evidence of a past heart attack.  Fluid buildup around the heart.  Heart muscle thickening.  Assess heart valve function. LET [facility] CARE PROVIDER KNOW ABOUT:  Any allergies you have.  All medicines you are taking, including vitamins, herbs, eye drops, creams, and over-the-counter medicines.  Previous problems you or members of your family have had with the use of anesthetics.  Any blood disorders you have.  Previous surgeries you have had.  Medical conditions you have.  Possibility of pregnancy, if this applies. BEFORE THE PROCEDURE  No special preparation is needed. Eat and drink normally.  PROCEDURE   In order to produce an image of your heart, gel will be applied to your chest and a wand-like tool (transducer) will be moved  over your chest. The gel will help transmit the sound waves from the transducer. The sound waves will harmlessly bounce off your heart to allow the heart images to be captured in real-time motion. These images will then be recorded.  You may need an IV to receive a medicine that  improves the quality of the pictures. AFTER THE PROCEDURE You may return to your normal schedule including diet, activities, and medicines, unless your health care provider tells you otherwise.   This information is not intended to replace advice given to you by your health care provider. Make sure you discuss any questions you have with your health care provider.   Document Released: 12/06/2000 Document Revised: 12/30/2014 Document Reviewed: 08/16/2013 Elsevier Interactive Patient Education 2016 Elsevier Inc. Cardiac Nuclear Scanning A cardiac nuclear scan is used to check your heart for problems, such as the following:  A portion of the heart is not getting enough blood.  Part of the heart muscle has died, which happens with a heart attack.  The heart wall is not working normally.  In this test, a radioactive dye (tracer) is injected into your bloodstream. After the tracer has traveled to your heart, a scanning device is used to measure how much of the tracer is absorbed by or distributed to various areas of your heart. LET [facility] CARE PROVIDER KNOW ABOUT:  Any allergies you have.  All medicines you are taking, including vitamins, herbs, eye drops, creams, and over-the-counter medicines.  Previous problems you or members of your family have had with the use of anesthetics.  Any blood disorders you have.  Previous surgeries you have had.  Medical conditions you have.  RISKS AND COMPLICATIONS Generally, this is a safe procedure. However, as with any procedure, problems can occur. Possible problems include:   Serious chest pain.  Rapid heartbeat.  Sensation of warmth in your chest. This usually passes quickly. BEFORE THE PROCEDURE Ask your health care provider about changing or stopping your regular medicines. PROCEDURE This procedure is usually done at a hospital and takes 2-4 hours.  An IV tube is inserted into one of your veins.  Your health care provider will  inject a small amount of radioactive tracer through the tube.  You will then wait for 20-40 minutes while the tracer travels through your bloodstream.  You will lie down on an exam table so images of your heart can be taken. Images will be taken for about 15-20 minutes.  You will exercise on a treadmill or stationary bike. While you exercise, your heart activity will be monitored with an electrocardiogram (ECG), and your blood pressure will be checked.  If you are unable to exercise, you may be given a medicine to make your heart beat faster.  When blood flow to your heart has peaked, tracer will again be injected through the IV tube.  After 20-40 minutes, you will get back on the exam table and have more images taken of your heart.  When the procedure is over, your IV tube will be removed. AFTER THE PROCEDURE  You will likely be able to leave shortly after the test. Unless your health care provider tells you otherwise, you may return to your normal schedule, including diet, activities, and medicines.  Make sure you find out how and when you will get your test results.   This information is not intended to replace advice given to you by your health care provider. Make sure you discuss any questions you have with your health  care provider.   Document Released: 01/03/2005 Document Revised: 12/14/2013 Document Reviewed: 11/17/2013 Elsevier Interactive Patient Education [employer].

## 2016-07-23 LAB — BASIC METABOLIC PANEL
BUN / CREAT RATIO: 19 (ref 12–28)
BUN: 21 mg/dL (ref 8–27)
CO2: 24 mmol/L (ref 18–29)
Calcium: 10 mg/dL (ref 8.7–10.3)
Chloride: 102 mmol/L (ref 96–106)
Creatinine, Ser: 1.09 mg/dL — ABNORMAL HIGH (ref 0.57–1.00)
GFR calc non Af Amer: 49 mL/min/{1.73_m2} — ABNORMAL LOW (ref >59)
GFR, EST AFRICAN AMERICAN: 57 mL/min/{1.73_m2} — AB (ref >59)
Glucose: 56 mg/dL — ABNORMAL LOW (ref 65–99)
POTASSIUM: 4.9 mmol/L (ref 3.5–5.2)
SODIUM: 142 mmol/L (ref 134–144)

## 2016-07-23 LAB — TSH: TSH: 0.641 u[IU]/mL (ref 0.450–4.500)

## 2016-07-29 ENCOUNTER — Encounter
Admission: RE | Admit: 2016-07-29 | Discharge: 2016-07-29 | Disposition: A | Discharge status: Home / Self Care | Payer: Medicare Other | Source: Ambulatory Visit | Attending: Physician Assistant | Admitting: Physician Assistant

## 2016-07-29 DIAGNOSIS — I2510 Atherosclerotic heart disease of native coronary artery without angina pectoris: Secondary | ICD-10-CM | POA: Diagnosis present

## 2016-07-29 DIAGNOSIS — K559 Vascular disorder of intestine, unspecified: Secondary | ICD-10-CM | POA: Diagnosis present

## 2016-07-29 DIAGNOSIS — J42 Unspecified chronic bronchitis: Secondary | ICD-10-CM | POA: Insufficient documentation

## 2016-07-29 DIAGNOSIS — D508 Other iron deficiency anemias: Secondary | ICD-10-CM | POA: Insufficient documentation

## 2016-07-29 DIAGNOSIS — I119 Hypertensive heart disease without heart failure: Secondary | ICD-10-CM | POA: Diagnosis present

## 2016-07-29 DIAGNOSIS — I480 Paroxysmal atrial fibrillation: Secondary | ICD-10-CM | POA: Diagnosis present

## 2016-07-29 DIAGNOSIS — K922 Gastrointestinal hemorrhage, unspecified: Secondary | ICD-10-CM | POA: Insufficient documentation

## 2016-07-29 LAB — NM MYOCAR MULTI W/SPECT W/WALL MOTION / EF
CHL CUP MPHR: 144 {beats}/min
CHL CUP NUCLEAR SDS: 0
CHL CUP NUCLEAR SRS: 0
CHL CUP NUCLEAR SSS: 0
CHL CUP RESTING HR STRESS: 47 {beats}/min
CSEPED: 1 min
CSEPEW: 1 METS
CSEPPHR: 79 {beats}/min
Exercise duration (sec): 1 s
LV dias vol: 56 mL (ref 46–106)
LV sys vol: 9 mL
NUC STRESS TID: 0.74
Percent HR: 54 %

## 2016-07-29 MED ORDER — TECHNETIUM TC 99M TETROFOSMIN IV KIT
33.0000 | PACK | Freq: Once | INTRAVENOUS | Status: AC | PRN
  Administered 2016-07-29: 31.3 via INTRAVENOUS

## 2016-07-29 MED ORDER — TECHNETIUM TC 99M TETROFOSMIN IV KIT
13.0000 | PACK | Freq: Once | INTRAVENOUS | Status: AC | PRN
  Administered 2016-07-29: 12.898 via INTRAVENOUS

## 2016-07-29 MED ORDER — REGADENOSON 0.4 MG/5ML IV SOLN
0.4000 mg | Freq: Once | INTRAVENOUS | Status: AC
  Administered 2016-07-29: 0.4 mg via INTRAVENOUS
  Filled 2016-07-29: qty 5

## 2016-08-01 ENCOUNTER — Telehealth: Payer: Self-pay | Admitting: Cardiovascular Disease

## 2016-08-01 ENCOUNTER — Other Ambulatory Visit: Payer: Self-pay | Admitting: *Deleted

## 2016-08-01 MED ORDER — DILTIAZEM HCL ER COATED BEADS 120 MG PO CP24: 120.0000 mg | ORAL_CAPSULE | Freq: Every day | ORAL | 3 refills | Status: DC

## 2016-08-01 NOTE — Telephone Encounter (Signed)
  Requested Prescriptions   Signed Prescriptions Disp Refills  . diltiazem (CARDIZEM CD) 120 MG 24 hr capsule 90 capsule 3    Sig: Take 1 capsule (120 mg total) by mouth daily after breakfast.    Authorizing Provider: Antonieta IbaGOLLAN, TIMOTHY J    Ordering User: Kendrick FriesLOPEZ, Finley Chevez C

## 2016-08-01 NOTE — Telephone Encounter (Signed)
*  STAT* If patient is at the pharmacy, call can be transferred to refill team.   1. Which medications need to be refilled? (please list name of each medication and dose if known) diltiazem (CARDIZEM CD) 120 MG 24 hr capsule  2. Which pharmacy/location (including street and city if local pharmacy) [facility]  3. Do they need a 30 day or 90 day supply? 90 day  2nd  request Rx expired

## 2016-08-06 ENCOUNTER — Other Ambulatory Visit: Payer: Self-pay

## 2016-08-06 ENCOUNTER — Ambulatory Visit (INDEPENDENT_AMBULATORY_CARE_PROVIDER_SITE_OTHER): Payer: Medicare Other

## 2016-08-06 DIAGNOSIS — I119 Hypertensive heart disease without heart failure: Secondary | ICD-10-CM

## 2016-08-06 DIAGNOSIS — I2510 Atherosclerotic heart disease of native coronary artery without angina pectoris: Secondary | ICD-10-CM | POA: Diagnosis not present

## 2016-08-06 DIAGNOSIS — K922 Gastrointestinal hemorrhage, unspecified: Secondary | ICD-10-CM

## 2016-08-06 DIAGNOSIS — I480 Paroxysmal atrial fibrillation: Secondary | ICD-10-CM

## 2016-08-06 DIAGNOSIS — K559 Vascular disorder of intestine, unspecified: Secondary | ICD-10-CM | POA: Diagnosis not present

## 2016-08-06 DIAGNOSIS — D508 Other iron deficiency anemias: Secondary | ICD-10-CM

## 2016-08-06 DIAGNOSIS — J42 Unspecified chronic bronchitis: Secondary | ICD-10-CM

## 2016-08-07 ENCOUNTER — Other Ambulatory Visit: Payer: Self-pay

## 2016-08-07 MED ORDER — METOPROLOL SUCCINATE ER 25 MG PO TB24: 12.5000 mg | ORAL_TABLET | Freq: Every evening | ORAL | 3 refills | Status: DC

## 2016-08-14 ENCOUNTER — Ambulatory Visit: Payer: Medicare Other | Attending: Pain Medicine | Admitting: Pain Medicine

## 2016-08-14 ENCOUNTER — Encounter: Payer: Self-pay | Admitting: Pain Medicine

## 2016-08-14 VITALS — BP 133/64 | HR 47 | Temp 97.8°F | Resp 12 | Ht 65.0 in | Wt 160.0 lb

## 2016-08-14 DIAGNOSIS — M48062 Spinal stenosis, lumbar region with neurogenic claudication: Secondary | ICD-10-CM

## 2016-08-14 DIAGNOSIS — Z9889 Other specified postprocedural states: Secondary | ICD-10-CM

## 2016-08-14 DIAGNOSIS — M47816 Spondylosis without myelopathy or radiculopathy, lumbar region: Secondary | ICD-10-CM

## 2016-08-14 DIAGNOSIS — M1712 Unilateral primary osteoarthritis, left knee: Secondary | ICD-10-CM | POA: Diagnosis not present

## 2016-08-14 DIAGNOSIS — M79606 Pain in leg, unspecified: Secondary | ICD-10-CM | POA: Diagnosis present

## 2016-08-14 DIAGNOSIS — M545 Low back pain: Secondary | ICD-10-CM | POA: Diagnosis present

## 2016-08-14 DIAGNOSIS — M5416 Radiculopathy, lumbar region: Secondary | ICD-10-CM

## 2016-08-14 DIAGNOSIS — M47817 Spondylosis without myelopathy or radiculopathy, lumbosacral region: Secondary | ICD-10-CM

## 2016-08-14 DIAGNOSIS — M160 Bilateral primary osteoarthritis of hip: Secondary | ICD-10-CM

## 2016-08-14 DIAGNOSIS — M533 Sacrococcygeal disorders, not elsewhere classified: Secondary | ICD-10-CM

## 2016-08-14 DIAGNOSIS — M170 Bilateral primary osteoarthritis of knee: Secondary | ICD-10-CM

## 2016-08-14 DIAGNOSIS — M25561 Pain in right knee: Secondary | ICD-10-CM | POA: Diagnosis present

## 2016-08-14 DIAGNOSIS — M5136 Other intervertebral disc degeneration, lumbar region: Secondary | ICD-10-CM

## 2016-08-14 DIAGNOSIS — M961 Postlaminectomy syndrome, not elsewhere classified: Secondary | ICD-10-CM

## 2016-08-14 MED ORDER — MIDAZOLAM HCL 5 MG/5ML IJ SOLN
5.0000 mg | Freq: Once | INTRAMUSCULAR | Status: AC
  Administered 2016-08-14: 2 mg via INTRAVENOUS
  Filled 2016-08-14: qty 5

## 2016-08-14 MED ORDER — LACTATED RINGERS IV SOLN
1000.0000 mL | INTRAVENOUS | Status: DC
  Administered 2016-08-14: 1000 mL via INTRAVENOUS

## 2016-08-14 MED ORDER — OXYCODONE HCL 5 MG PO TABS: ORAL_TABLET | ORAL | 0 refills | Status: DC

## 2016-08-14 MED ORDER — ORPHENADRINE CITRATE 30 MG/ML IJ SOLN
60.0000 mg | Freq: Once | INTRAMUSCULAR | Status: DC
  Filled 2016-08-14: qty 2

## 2016-08-14 MED ORDER — CEFAZOLIN SODIUM 1 G IJ SOLR
INTRAMUSCULAR | Status: AC
  Administered 2016-08-14: 1 g via INTRAVENOUS
  Filled 2016-08-14: qty 10

## 2016-08-14 MED ORDER — LIDOCAINE HCL (PF) 1 % IJ SOLN
10.0000 mL | Freq: Once | INTRAMUSCULAR | Status: AC
  Administered 2016-08-14: 10 mL via SUBCUTANEOUS

## 2016-08-14 MED ORDER — BUPIVACAINE HCL (PF) 0.25 % IJ SOLN
30.0000 mL | Freq: Once | INTRAMUSCULAR | Status: AC
  Administered 2016-08-14: 30 mL
  Filled 2016-08-14: qty 30

## 2016-08-14 MED ORDER — PREGABALIN 25 MG PO CAPS: ORAL_CAPSULE | ORAL | 0 refills | Status: DC

## 2016-08-14 MED ORDER — FENTANYL CITRATE (PF) 100 MCG/2ML IJ SOLN
100.0000 ug | Freq: Once | INTRAMUSCULAR | Status: AC
  Administered 2016-08-14: 50 ug via INTRAVENOUS
  Filled 2016-08-14: qty 2

## 2016-08-14 MED ORDER — CEFAZOLIN IN D5W 1 GM/50ML IV SOLN: 1.0000 g | Freq: Once | INTRAVENOUS | Status: DC

## 2016-08-14 MED ORDER — TRIAMCINOLONE ACETONIDE 40 MG/ML IJ SUSP
40.0000 mg | Freq: Once | INTRAMUSCULAR | Status: AC
  Administered 2016-08-14: 40 mg
  Filled 2016-08-14: qty 1

## 2016-08-14 MED ORDER — CEFUROXIME AXETIL 250 MG PO TABS: 250.0000 mg | ORAL_TABLET | Freq: Two times a day (BID) | ORAL | 0 refills | Status: DC

## 2016-08-14 NOTE — Progress Notes (Signed)
Safety precautions to be maintained throughout the outpatient stay will include: orient to surroundings, keep bed in low position, maintain call bell within reach at all times, provide assistance with transfer out of bed and ambulation.  
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
## 2016-08-14 NOTE — Progress Notes (Signed)
    Genicular nerve blocks of the left knee   The patient is a 75 y.o. female who returns to the [facility] for further evaluation and treatment of pain involving the lumbar lower extremity region with severe pain of the left knee. Prior studies reveal patient to be with significant degenerative joint disease of the knee.  We will proceed with genicular nerve blocks of the left knee in an attempt to decrease severity of symptoms, minimize the risk of medication escalation, hopefully retard progression of symptoms and avoid the need for more involved treatment.  The risks benefits and expectations of the procedure were discussed with the patient. The patient was with understanding and in agreement with suggested treatment plan.  DESCRIPTION OF PROCEDURE: Genicular nerve blocks of the left knee. The  procedure was performed with IV Versed and IV fentanyl, conscious sedation and under fluoroscopic guidance.  NEEDLE PLACEMENT FOR BLOCK OF THE LATERAL SUPERIOR GENICULAR NERVE: The patient was taking to the fluoroscopy suite. With the patient supine, with knee in flexed position, Betadine prep of proposed entry site accomplished.  IV Versed, IV fentanyl conscious sedation, EKG, blood pressure, pulse, capnography, and pulse oximetry monitoring were all in place. Under fluoroscopic guidance, a 22-gauge needle was inserted in the region of the left knee with needle placed at the lateral border of the femur at the junction of the shaft of the femur and the condyle of the femur.  Following needle placement at the lateral aspect of the knee, needle placement was then accomplished in the region of the medial aspect of the knee.  NEEDLE PLACEMENT FOR BLOCK OF THE MEDIAL SUPERIOR GENICULAR NERVE:  Under fluoroscopic guidance, a 22 - gauge needle was inserted in the region of the left knee with needle placed at the medial border of the femur at the junction of the shaft of the femur and the condyle of the  femur.   NEEDLE PLACEMENT FOR BLOCK OF THE MEDIAL INFERIOR GENICULAR NERVE:  Under fluoroscopic guidance, a 22 - gauge needle was inserted in the region of the left knee with needle placed at the junction of the shaft and plateau of the tibia.   Following needle placement on AP view of needles placed in all three locations, placement was then verified on lateral view with the tips of the superior lateral and superior medial needles documented to be one half the distance of the shaft of the femur and the tip of the inferior medial geniculate needle documented to be one half the distance of the shaft of the tibia.  Following documentation of needle placements on lateral view, each needle was injected with one mL of 0.25% bupivacaine with Kenalog. A total of 10 mg of Kenalog was utilized for the entire procedure. The patient tolerated the procedure well.    PLAN 1. Medications: Continue present medications Lyrica and oxycodone 2. Follow-up appointment with PCP Dr. Lawerance BachBurns for evaluation of blood pressure and general medical condition. Diabetes mellitus with 3. Follow-up surgical evaluation Neurosurgical and orthopedic evaluations have been addressed 4. Follow-up neurological evaluation 5. The patient may be a candidate for radiofrequency rhizolysis and other treatment pending response to treatment on today's visit and follow-up evaluation. 6. The patient is advised to adhere to proper body mechanics and avoid activities which appear to aggravate condition

## 2016-08-15 ENCOUNTER — Telehealth: Payer: Self-pay

## 2016-08-15 NOTE — Telephone Encounter (Signed)
Denies any needs at this time- States she feels some better.

## 2016-08-27 ENCOUNTER — Telehealth: Payer: Self-pay | Admitting: *Deleted

## 2016-08-29 ENCOUNTER — Encounter: Payer: Self-pay | Admitting: Cardiovascular Disease

## 2016-08-29 ENCOUNTER — Ambulatory Visit (INDEPENDENT_AMBULATORY_CARE_PROVIDER_SITE_OTHER): Payer: Medicare Other | Admitting: Cardiovascular Disease

## 2016-08-29 VITALS — BP 120/66 | HR 52 | Ht 65.0 in | Wt 166.5 lb

## 2016-08-29 DIAGNOSIS — I480 Paroxysmal atrial fibrillation: Secondary | ICD-10-CM | POA: Diagnosis not present

## 2016-08-29 DIAGNOSIS — I2510 Atherosclerotic heart disease of native coronary artery without angina pectoris: Secondary | ICD-10-CM

## 2016-08-29 NOTE — Progress Notes (Signed)
Cardiology Office Note   Date:  08/29/2016   ID:  Yvette Collier, DOB 05/07/1941, MRN [id number]  PCP:  Yvette HopesHarriett P Burns, MD  Cardiologist:  Dr. Mariah Collier  Chief Complaint  Patient presents with  . Other    F/u echo c/o irregular heart beat. Meds reviewed verbally with pt.      History of Present Illness: Yvette Collier is a 75 y.o. female who presents for a follow-up visit. Previous cardiac catheterization in June 2010 showed no obstructive coronary artery disease. She has known history of paroxysmal atrial fibrillation not on long-term anticoagulation due to recurrent GI bleed. Most recent echocardiogram showed normal LV systolic function. She was seen last month by Yvette Collier for increased shortness of breath. She was noted to have worsening bradycardia with heart rate in the 40s. The dose of Toprol was decreased to 12.5 mg once daily. She is also on diltiazem extended release 120 mg once daily. Due to worsening exertional dyspnea, she underwent a pharmacologic nuclear stress test which showed no evidence of ischemia with normal ejection fraction. She reports improvement in symptoms overall. She is less fatigued. She has intermittent episodes of palpitations but no prolonged tachycardia. She has occasional dizziness but no recent syncope or presyncope. Her palpitations seems to be triggered by her GI symptoms.   Past Medical History:  Diagnosis Date  . Asthma   . Chronic lower back pain   . COPD (chronic obstructive pulmonary disease) (HCC)   . Diabetes mellitus without complication (HCC)   . Gastrointestinal bleed   . GERD (gastroesophageal reflux disease)   . History of colon polyps   . Hypertension   . Hypertensive heart disease   . IBS (irritable bowel syndrome)   . Ischemic colitis (HCC)   . Non-obstructive Coronary Artery Disease    a. 05/2009 Cath [facility]): minimal nonobs dzs.  . Osteoarthritis   . PAF (paroxysmal atrial fibrillation) (HCC)    a. 09/2014 during GI illness;  b.  CHA2DS2VASc = 5 (not currently on OAC 2/2 h/o GIB/ischemic colitis); c. 09/2014 Echo: EF 60-65%, imparied relaxation, nl RV size/fxn.  . Transient cerebral ischemia due to atrial fibrillation Yvette Collier(HCC)     Past Surgical History:  Procedure Laterality Date  . ABDOMINAL HYSTERECTOMY    . APPENDECTOMY    . BACK SURGERY     x 3, last 2004.  . CHOLECYSTECTOMY    . FOOT SURGERY Right   . HEMORRHOID BANDING    . left total hip arthroplasty  2012  . STOMACH SURGERY    . TONSILLECTOMY       Current Outpatient Prescriptions  Medication Sig Dispense Refill  . aspirin EC 81 MG tablet Take 81 mg by mouth daily.    . cefUROXime (CEFTIN) 250 MG tablet Take 1 tablet (250 mg total) by mouth 2 (two) times daily with a meal. 14 tablet 0  . cetirizine (ZYRTEC) 10 MG tablet Take 10 mg by mouth daily.    . cholestyramine light (PREVALITE) 4 g packet Take 2 g by mouth 2 (two) times daily.    Marland Kitchen. dexlansoprazole (DEXILANT) 60 MG capsule Take 1 capsule (60 mg total) by mouth daily. 30 capsule 11  . dicyclomine (BENTYL) 20 MG tablet Take 1 tablet (20 mg total) by mouth 3 (three) times daily before meals. 90 tablet 6  . diltiazem (CARDIZEM CD) 120 MG 24 hr capsule Take 1 capsule (120 mg total) by mouth daily after breakfast. 90 capsule 3  . glipiZIDE (GLUCOTROL  XL) 2.5 MG 24 hr tablet Take 2.5 mg by mouth daily with breakfast.    . losartan (COZAAR) 25 MG tablet Take 25 mg by mouth daily.     . metoprolol succinate (TOPROL-XL) 25 MG 24 hr tablet Take 0.5 tablets (12.5 mg total) by mouth every evening. Take with or immediately following a meal. 30 tablet 3  . oxyCODONE (OXY IR/ROXICODONE) 5 MG immediate release tablet Limit 1 tablet by mouth 2-3 times per day if tolerated 90 tablet 0  . pregabalin (LYRICA) 25 MG capsule Limit  1 tablet by mouth per day or twice per day if tolerated 60 capsule 0  . Probiotic Product (VSL#3) CAPS Take 1 capsule by mouth 2 (two) times daily. 60 capsule 11  . ranitidine (ZANTAC) 150 MG  tablet Take 1 tablet (150 mg total) by mouth 2 (two) times daily. 60 tablet 3  . simvastatin (ZOCOR) 10 MG tablet Take 10 mg by mouth every evening.      Current Facility-Administered Medications  Medication Dose Route Frequency Provider Last Rate Last Dose  . ceFAZolin (ANCEF) IVPB 1 g/50 mL premix  1 g Intravenous Once Ewing Schlein, MD      . ceFAZolin (ANCEF) IVPB 1 g/50 mL premix  1 g Intravenous Once Ewing Schlein, MD      . ceFAZolin (ANCEF) IVPB 1 g/50 mL premix  1 g Intravenous Once Ewing Schlein, MD      . lactated ringers infusion 1,000 mL  1,000 mL Intravenous Continuous Ewing Schlein, MD      . lactated ringers infusion 1,000 mL  1,000 mL Intravenous Continuous Ewing Schlein, MD 125 mL/hr at 08/14/16 0819 1,000 mL at 08/14/16 0819  . orphenadrine (NORFLEX) injection 60 mg  60 mg Intramuscular Once Ewing Schlein, MD      . orphenadrine (NORFLEX) injection 60 mg  60 mg Intramuscular Once Ewing Schlein, MD        Allergies:   Effexor [venlafaxine]; Ivp dye [iodinated diagnostic agents]; Lisinopril; and Sulfa antibiotics    Social History:  The patient  reports that she has never smoked. She has never used smokeless tobacco. She reports that she does not drink alcohol or use drugs.   Family History:  The patient's family history includes Heart attack in her mother; Heart disease in her father; Hypertension in her brother, father, maternal aunt, and mother.    ROS:  Please see the history of present illness.   Otherwise, review of systems are positive for none.   All other systems are reviewed and negative.    PHYSICAL EXAM: VS:  BP 120/66 (BP Location: Left Arm, Patient Position: Sitting, Cuff Size: Normal)   Pulse (!) 52   Ht 5\' 5"  (1.651 m)   Wt 166 lb 8 oz (75.5 kg)   BMI 27.71 kg/m  , BMI Body mass index is 27.71 kg/m. GEN: Well nourished, well developed, in no acute distress  HEENT: normal  Neck: no JVD, carotid bruits, or masses Cardiac: RRR; no murmurs, rubs, or  gallops,no edema  Respiratory:  clear to auscultation bilaterally, normal work of breathing GI: soft, nontender, nondistended, + BS MS: no deformity or atrophy  Skin: warm and dry, no rash Neuro:  Strength and sensation are intact Psych: euthymic mood, full affect   EKG:  EKG is ordered today. The ekg ordered today demonstrates sinus bradycardia with first-degree AV block.   Recent Labs: 05/24/2016: ALT 13 05/25/2016: Hemoglobin 11.5; Platelets 298 07/22/2016: BUN 21; Creatinine, Ser 1.09; Potassium 4.9;  Sodium 142; TSH 0.641    Lipid Panel No results found for: CHOL, TRIG, HDL, CHOLHDL, VLDL, LDLCALC, LDLDIRECT    Wt Readings from Last 3 Encounters:  08/29/16 166 lb 8 oz (75.5 kg)  08/14/16 160 lb (72.6 kg)  07/22/16 163 lb 12.8 oz (74.3 kg)        No flowsheet data found.    ASSESSMENT AND PLAN:  1.  Paroxysmal atrial fibrillation: She is currently in normal sinus rhythm. Bradycardia improved after decreasing the dose of Toprol. She is not on long-term anticoagulation due to recurrent GI bleed.  2. Asymptomatic bradycardia: Improved after decreasing Toprol. She is also on small dose diltiazem.  3. Exertional dyspnea: Nuclear stress test recently was normal. Suspect physical deconditioning.  4. History of recurrent GI bleed and ischemic colitis: This has been stable she follows up with GI on a regular basis.    Disposition:   FU with Dr. Mariah Milling in 4 months    Signed,  Lorine Bears, MD  08/29/2016 11:12 AM    [facility]

## 2016-08-29 NOTE — Patient Instructions (Signed)
Medication Instructions: Continue same medications.   Labwork: None.   Procedures/Testing: None.   Follow-Up: 4 months follow up with Dr. Mariah MillingGollan.   Any Additional Special Instructions Will Be Listed Below (If Applicable).     If you need a refill on your cardiac medications before your next appointment, please call your pharmacy.

## 2016-09-12 ENCOUNTER — Ambulatory Visit: Payer: Medicare Other | Admitting: Pain Medicine

## 2016-09-15 ENCOUNTER — Other Ambulatory Visit: Payer: Self-pay | Admitting: Pain Medicine

## 2016-09-17 ENCOUNTER — Other Ambulatory Visit: Payer: Self-pay | Admitting: Pain Medicine

## 2016-12-24 ENCOUNTER — Ambulatory Visit: Payer: Medicare Other | Admitting: Gastroenterology

## 2016-12-25 ENCOUNTER — Ambulatory Visit (INDEPENDENT_AMBULATORY_CARE_PROVIDER_SITE_OTHER): Payer: Medicare Other | Admitting: Gastroenterology

## 2016-12-25 ENCOUNTER — Ambulatory Visit
Admission: RE | Admit: 2016-12-25 | Discharge: 2016-12-25 | Disposition: A | Discharge status: Home / Self Care | Payer: Medicare Other | Source: Ambulatory Visit | Attending: Gastroenterology | Admitting: Gastroenterology

## 2016-12-25 DIAGNOSIS — K5903 Drug induced constipation: Secondary | ICD-10-CM

## 2016-12-25 MED ORDER — PSYLLIUM 58.6 % PO POWD: 1.0000 | Freq: Every day | ORAL | 12 refills | Status: AC
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
## 2016-12-27 ENCOUNTER — Encounter: Payer: Self-pay | Admitting: Cardiovascular Disease

## 2016-12-27 ENCOUNTER — Ambulatory Visit (INDEPENDENT_AMBULATORY_CARE_PROVIDER_SITE_OTHER): Payer: Medicare Other | Admitting: Cardiovascular Disease

## 2016-12-27 ENCOUNTER — Ambulatory Visit (INDEPENDENT_AMBULATORY_CARE_PROVIDER_SITE_OTHER): Payer: Medicare Other

## 2016-12-27 VITALS — BP 138/80 | HR 65 | Ht 65.0 in | Wt 161.2 lb

## 2016-12-27 DIAGNOSIS — R000 Tachycardia, unspecified: Secondary | ICD-10-CM | POA: Diagnosis not present

## 2016-12-27 DIAGNOSIS — I480 Paroxysmal atrial fibrillation: Secondary | ICD-10-CM

## 2016-12-27 DIAGNOSIS — R002 Palpitations: Secondary | ICD-10-CM

## 2016-12-27 DIAGNOSIS — I10 Essential (primary) hypertension: Secondary | ICD-10-CM | POA: Diagnosis not present

## 2016-12-27 DIAGNOSIS — K648 Other hemorrhoids: Secondary | ICD-10-CM

## 2016-12-27 DIAGNOSIS — K559 Vascular disorder of intestine, unspecified: Secondary | ICD-10-CM

## 2016-12-27 DIAGNOSIS — I2510 Atherosclerotic heart disease of native coronary artery without angina pectoris: Secondary | ICD-10-CM | POA: Diagnosis not present

## 2016-12-27 DIAGNOSIS — E118 Type 2 diabetes mellitus with unspecified complications: Secondary | ICD-10-CM

## 2016-12-27 NOTE — Progress Notes (Signed)
Cardiology Office Note  Date:  12/27/2016   ID:  Tomma RakersMary L Wolz, DOB 1941/05/23, MRN [id number]  PCP:  Hyman HopesHarriett P Burns, MD   Chief Complaint  Patient presents with  . other    4 month follow up. Meds reviewed by the pt. verbally. Pt. c/o shortness of breath with heart fluttering in the evenings.     HPI:  76 y.o. female with a history of significant GI issues including ischemic colitis, IBS,  hospital admission 09/15/2014 for GI complications, noted to have atrial fibrillation 09/22/14. She presents for routine followup of her atrial fibrillation She has a [facility] showing minimal coronary artery disease, history of anemia, back surgery 3 lasting 2004, left hip replacement 2012  Seen 08/2016 In clinic At that time had increased shortness of breath. worsening bradycardia with heart rate in the 40s. The dose of Toprol was decreased to 12.5 mg once daily. She is also on diltiazem extended release 120 mg once daily.  pharmacologic nuclear stress test which showed no evidence of ischemia with normal ejection fraction.    It was felt Her palpitations seems to be triggered by her GI symptoms.  Again on today's visit is having Significant Gi issues, Problems with severe constipation, cramping, hard bowel movements Light leading when she has hard bowel movements Feels her stomach issues Causes heart palpitations and "nervousness in stomach" Seen GI, started on laxatives  EKG on today's visit shows normal sinus rhythm with no significant ST or T-wave changes  In the past with significant stressors.  pastor of a church.   Other past medical history hospital admission [facility] from 9/24-10/3 for diarrhea, segmental colitis c/w ischemic colitis who developed a 2 short runs of a-fib on the evening of 10/1, then went back into [facility].   It was felt that her a-fib was 2/2 Suspect secondary to multiple issues: ABD pain, distress, IVF and cardiac stretch. Electrolytes  were ok.   Previous CT abdomen showed  segmental colitis c/w ischemic colitis, though perhaps a small vessel disease and not an urgent surgical issue per GI.   Echo showed EF 60-65%, normal global left ventricular systolic function, impaired relaxation pattern of LV diastolic filling, normal right ventricular size and systolic function, normal RVSP.  PMH:   has a past medical history of Asthma; Chronic lower back pain; COPD (chronic obstructive pulmonary disease) (HCC); Diabetes mellitus without complication (HCC); Gastrointestinal bleed; GERD (gastroesophageal reflux disease); History of colon polyps; Hypertension; Hypertensive heart disease; IBS (irritable bowel syndrome); Ischemic colitis (HCC); Non-obstructive Coronary Artery Disease; Osteoarthritis; PAF (paroxysmal atrial fibrillation) (HCC); and Transient cerebral ischemia due to atrial fibrillation (HCC).  PSH:    Past Surgical History:  Procedure Laterality Date  . ABDOMINAL HYSTERECTOMY    . APPENDECTOMY    . BACK SURGERY     x 3, last 2004.  . CHOLECYSTECTOMY    . FOOT SURGERY Right   . HEMORRHOID BANDING    . left total hip arthroplasty  2012  . STOMACH SURGERY    . TONSILLECTOMY      Current Outpatient Prescriptions  Medication Sig Dispense Refill  . aspirin EC 81 MG tablet Take 81 mg by mouth daily.    . cetirizine (ZYRTEC) 10 MG tablet Take 10 mg by mouth daily.    Marland Kitchen. dicyclomine (BENTYL) 20 MG tablet Take 20 mg by mouth 3 (three) times daily before meals.    Marland Kitchen. diltiazem (CARDIZEM CD) 120 MG 24 hr capsule Take 1  capsule (120 mg total) by mouth daily after breakfast. 90 capsule 3  . glipiZIDE (GLUCOTROL XL) 2.5 MG 24 hr tablet Take 2.5 mg by mouth daily with breakfast.    . losartan (COZAAR) 25 MG tablet Take 25 mg by mouth daily.     . metoprolol succinate (TOPROL-XL) 50 MG 24 hr tablet Take 50 mg by mouth daily. Take with or immediately following a meal.    . oxyCODONE (OXY IR/ROXICODONE) 5 MG immediate release tablet  Limit 1 tablet by mouth 2-3 times per day if tolerated 90 tablet 0  . pregabalin (LYRICA) 25 MG capsule Limit  1 tablet by mouth per day or twice per day if tolerated 60 capsule 0  . Probiotic Product (VSL#3) CAPS Take 1 capsule by mouth 2 (two) times daily. 60 capsule 11  . psyllium (METAMUCIL SMOOTH TEXTURE) 58.6 % powder Take 1 packet by mouth daily. 283 g 12  . simvastatin (ZOCOR) 10 MG tablet Take 10 mg by mouth every evening.     . Vitamin D, Ergocalciferol, (DRISDOL) 50000 units CAPS capsule Take 50,000 Units by mouth every 7 (seven) days.     Current Facility-Administered Medications  Medication Dose Route Frequency Provider Last Rate Last Dose  . ceFAZolin (ANCEF) IVPB 1 g/50 mL premix  1 g Intravenous Once Ewing Schlein, MD      . ceFAZolin (ANCEF) IVPB 1 g/50 mL premix  1 g Intravenous Once Ewing Schlein, MD      . ceFAZolin (ANCEF) IVPB 1 g/50 mL premix  1 g Intravenous Once Ewing Schlein, MD      . lactated ringers infusion 1,000 mL  1,000 mL Intravenous Continuous Ewing Schlein, MD      . lactated ringers infusion 1,000 mL  1,000 mL Intravenous Continuous Ewing Schlein, MD 125 mL/hr at 08/14/16 0819 1,000 mL at 08/14/16 0819  . orphenadrine (NORFLEX) injection 60 mg  60 mg Intramuscular Once Ewing Schlein, MD      . orphenadrine (NORFLEX) injection 60 mg  60 mg Intramuscular Once Ewing Schlein, MD         Allergies:   Effexor [venlafaxine]; Ivp dye [iodinated diagnostic agents]; Lisinopril; and Sulfa antibiotics   Social History:  The patient  reports that she has never smoked. She has never used smokeless tobacco. She reports that she does not drink alcohol or use drugs.   Family History:   family history includes Heart attack in her mother; Heart disease in her father; Hypertension in her brother, father, maternal aunt, and mother.    Review of Systems: Review of Systems  Constitutional: Negative.   Respiratory: Negative.   Cardiovascular: Negative.   Gastrointestinal:  Negative.   Musculoskeletal: Negative.   Neurological: Negative.   Psychiatric/Behavioral: Negative.   All other systems reviewed and are negative.    PHYSICAL EXAM: VS:  BP 138/80 (BP Location: Left Arm, Patient Position: Sitting, Cuff Size: Normal)   Pulse 65   Ht 5\' 5"  (1.651 m)   Wt 161 lb 4 oz (73.1 kg)   BMI 26.83 kg/m  , BMI Body mass index is 26.83 kg/m. GEN: Well nourished, well developed, in no acute distress  HEENT: normal  Neck: no JVD, carotid bruits, or masses Cardiac: RRR; no murmurs, rubs, or gallops,no edema  Respiratory:  clear to auscultation bilaterally, normal work of breathing GI: soft, nontender, nondistended, + BS MS: no deformity or atrophy  Skin: warm and dry, no rash Neuro:  Strength and sensation are intact Psych: euthymic mood, full  affect    Recent Labs: 05/24/2016: ALT 13 05/25/2016: Hemoglobin 11.5; Platelets 298 07/22/2016: BUN 21; Creatinine, Ser 1.09; Potassium 4.9; Sodium 142; TSH 0.641    Lipid Panel No results found for: CHOL, HDL, LDLCALC, TRIG    Wt Readings from Last 3 Encounters:  12/27/16 161 lb 4 oz (73.1 kg)  08/29/16 166 lb 8 oz (75.5 kg)  08/14/16 160 lb (72.6 kg)       ASSESSMENT AND PLAN:  Coronary artery disease involving native coronary artery of native heart without angina pectoris - Plan: EKG 12-Lead, LONG TERM MONITOR (3-14 DAYS) Currently with no symptoms of angina. No further workup at this time. Continue current medication regimen. Recent negative stress test  Essential hypertension - Plan: EKG 12-Lead, LONG TERM MONITOR (3-14 DAYS) Blood pressure is well controlled on today's visit. No changes made to the medications.  PAF (paroxysmal atrial fibrillation) (HCC) -  Recent symptoms concerning for atrial fibrillation. She  reports palpitations, tachycardia exacerbated by her GI issues. There is types of workup discussed with her, discussed medication changes including possible need for anticoagulation if she is  having atrial fibrillation  recommended we place long term monitor for atrial fibrillation.  If this shows atrial fibrillation, would need to start anticoagulation   Tachycardia - Plan: LONG TERM MONITOR (3-14 DAYS)  IBS Constipation, waxing with loose bowel movements, followed by GI Has blood when she has hard bowel movement   Total encounter time more than 25 minutes  Greater than 50% was spent in counseling and coordination of care with the patient   Disposition:   F/U  6 months   Orders Placed This Encounter  Procedures  . LONG TERM MONITOR (3-14 DAYS)  . EKG 12-Lead     Signed, Dossie Arbour, M.D., Ph.D. 12/27/2016  [facility] [place], [place] [phone]

## 2016-12-27 NOTE — Patient Instructions (Addendum)
Medication Instructions:   No medication changes made  Try miralex for hard stools  Labwork:  No new labs needed  Testing/Procedures:  We will schedule a event monitor for atrial fibrillation, tachycardia, palpitations   I recommend watching educational videos on topics of interest to you at:       [email or web address]  Enter code: [email or web address]    Follow-Up: It was a pleasure seeing you in the office today. Please call us if you have new issues that need to be addressed before your next appt.  [phone]  Your physician wants you to follow-up in: 6 months.  You will receive a reminder letter in the mail two months in advance. If you don't receive a letter, please call our office to schedule the follow-up appointment.  If you need a refill on your cardiac medications before your next appointment, please call your pharmacy.

## 2017-01-03 ENCOUNTER — Telehealth: Payer: Self-pay | Admitting: Cardiovascular Disease

## 2017-01-03 DIAGNOSIS — I480 Paroxysmal atrial fibrillation: Secondary | ICD-10-CM

## 2017-01-03 DIAGNOSIS — R000 Tachycardia, unspecified: Secondary | ICD-10-CM

## 2017-01-03 DIAGNOSIS — I2510 Atherosclerotic heart disease of native coronary artery without angina pectoris: Secondary | ICD-10-CM

## 2017-01-03 DIAGNOSIS — I10 Essential (primary) hypertension: Secondary | ICD-10-CM | POA: Diagnosis not present

## 2017-01-03 DIAGNOSIS — R002 Palpitations: Secondary | ICD-10-CM

## 2017-01-03 NOTE — Telephone Encounter (Signed)
Attempted to contact pt.  No answer, vm box not set up yet.  

## 2017-01-03 NOTE — Telephone Encounter (Signed)
Pt calling stating she received the monitor we asked her to wear but she states it was turning her skill red And she called the company and they asked her to return it She states she just mailed it back to them  Please advise

## 2017-01-03 NOTE — Telephone Encounter (Signed)
Spoke w/ pt.  She reports that the monitor was coming off, she called the support line and they advised her to massage the adhesive and see if she could get it to stick better. That did not seem to help, so she was advised to mail the monitor back in. She mailed it back this am. Advised her that we will wait and see what the report shows and see if it picked up enough info. She is appreciative and will call back w/ any further questions or concerns.

## 2017-01-06 ENCOUNTER — Other Ambulatory Visit: Payer: Self-pay

## 2017-01-06 ENCOUNTER — Telehealth: Payer: Self-pay

## 2017-01-06 DIAGNOSIS — K5900 Constipation, unspecified: Secondary | ICD-10-CM

## 2017-01-06 MED ORDER — LINACLOTIDE 145 MCG PO CAPS: 145.0000 ug | ORAL_CAPSULE | Freq: Every day | ORAL | 2 refills | Status: DC

## 2017-01-06 NOTE — Telephone Encounter (Signed)
-----   Message from Wyline MoodKiran Anna, MD sent at 12/31/2016  8:28 AM EST ----- Inform x ray shows constipation- enquire if having satisfactory daily bowel movements with resolution of abdominal pain. If no better then commence on linzess 145 mcg q daily and ask her to call us if having watery stool or if not working

## 2017-01-06 NOTE — Telephone Encounter (Signed)
Pt notified of xray results. Rx has been sent to pharmacy as pt is still having constipation and abdominal pain. Pt will call me with any issues with medication.

## 2017-01-08 ENCOUNTER — Ambulatory Visit: Payer: Medicare Other | Admitting: Gastroenterology

## 2017-01-13 ENCOUNTER — Other Ambulatory Visit: Payer: Self-pay | Admitting: Pain Medicine

## 2017-01-13 ENCOUNTER — Other Ambulatory Visit: Payer: Self-pay | Admitting: *Deleted

## 2017-01-13 ENCOUNTER — Ambulatory Visit: Payer: Medicare Other | Admitting: Gastroenterology

## 2017-01-13 NOTE — Progress Notes (Deleted)
She is here today for follow  Up to her initial visit on 12/25/16 when she was seen for IBS-C  Interval history 12/25/2016-01/13/17  Moderate burden of stool seen on X ray .   Tomma RakersMary L Godlewski is a 76 y.o. y/o female here today to see me for abdominal pain , severe constipation and the pain is relieved after a bowel movement suggestive of irritable bowel syndrome with constipaton being predominant. She is on lyrica, questran, diltiazem, oxycodone all of which cause constipation. Will stop bentyl, questran and commence on metramucil, if no better will commence on linzess next visit.    Plan: 1.  Stop bentyl, questran 3. Metamucil 1 packet a day

## 2017-01-15 ENCOUNTER — Other Ambulatory Visit: Payer: Self-pay | Admitting: Cardiovascular Disease

## 2017-01-15 MED ORDER — METOPROLOL SUCCINATE ER 50 MG PO TB24: 50.0000 mg | ORAL_TABLET | Freq: Every day | ORAL | 1 refills | Status: DC

## 2017-01-21 ENCOUNTER — Ambulatory Visit (INDEPENDENT_AMBULATORY_CARE_PROVIDER_SITE_OTHER): Payer: Medicare Other | Admitting: Gastroenterology

## 2017-01-21 ENCOUNTER — Encounter: Payer: Self-pay | Admitting: Gastroenterology

## 2017-01-21 VITALS — BP 126/78 | HR 74 | Ht 65.0 in | Wt 158.0 lb

## 2017-01-21 DIAGNOSIS — I2510 Atherosclerotic heart disease of native coronary artery without angina pectoris: Secondary | ICD-10-CM | POA: Diagnosis not present

## 2017-01-21 DIAGNOSIS — K581 Irritable bowel syndrome with constipation: Secondary | ICD-10-CM

## 2017-01-21 NOTE — Patient Instructions (Signed)
Follow Up As Needed. Return if Symptoms return or Worsen

## 2017-01-21 NOTE — Progress Notes (Signed)
Primary Care Physician: Hyman Hopes, MD  Primary Gastroenterologist:  Dr. Wyline Mood   Chief Complaint  Patient presents with  . Follow-up    2 WEEK CHECK UP   She is here today for followup. She was last seen on 12/25/16 for abdominal pain of 3 weeks duration , better after a bowel movement , peppermint oil . Her history also suggested constipation which was long standing.She was on questran, lyrica,diltiazem,bentyl  and oxycodone.   Interval history 12/25/16-01/21/17 I obtained an X ray after her office visit which suggested moderate stool burden.  I stopped her bentyl,questran and commenced her on daily metamucil , linzess 145 mcg on 01/06/17 which she takes daily.   She sees since last visit she is doing much better. Has  Bowel movement daily. She still has some pain when she eats food " when food hits the stomach ". Overall her abdominal pain is much better. When she eats the pain is in her lower abdomen and lasts till she has a bowel movement . Feels the cramps are much better.   Allergies as of 01/21/2017 - Review Complete 12/27/2016  Allergen Reaction Noted  . Effexor [venlafaxine] Hives 07/14/2013  . Ivp dye [iodinated diagnostic agents] Hives 07/14/2013  . Lisinopril Swelling and Other (See Comments) 07/13/2015  . Sulfa antibiotics Hives 07/14/2013    ROS:  General: Negative for anorexia, weight loss, fever, chills, fatigue, weakness. ENT: Negative for hoarseness, difficulty swallowing , nasal congestion. CV: Negative for chest pain, angina, palpitations, dyspnea on exertion, peripheral edema.  Respiratory: Negative for dyspnea at rest, dyspnea on exertion, cough, sputum, wheezing.  GI: See history of present illness. GU:  Negative for dysuria, hematuria, urinary incontinence, urinary frequency, nocturnal urination.  Endo: Negative for unusual weight change.    Physical Examination:   There were no vitals taken for this visit.  General: Well-nourished,  well-developed in no acute distress.  Eyes: No icterus. Conjunctivae pink. Mouth: Oropharyngeal mucosa moist and pink , no lesions erythema or exudate. Lungs: Clear to auscultation bilaterally. Non-labored. Heart: Regular rate and rhythm, no murmurs rubs or gallops.  Abdomen: Bowel sounds are normal, nontender, nondistended, no hepatosplenomegaly or masses, no abdominal bruits or hernia , no rebound or guarding.   Extremities: No lower extremity edema. No clubbing or deformities. Neuro: Alert and oriented x 3.  Grossly intact. Skin: Warm and dry, no jaundice.   Psych: Alert and cooperative, normal mood and affect.   Imaging Studies: Dg Abd 1 View  Result Date: 12/25/2016 CLINICAL DATA:  Abdominal pain and constipation for the past 3 weeks likely secondary to from sickle agents. History of GI bleed, irritable bowel syndrome. EXAM: ABDOMEN - 1 VIEW COMPARISON:  Abdominopelvic CT scan of May 25, 2016 FINDINGS: The colonic stool burden is moderate. There is no small or large bowel obstructive pattern. There is no evidence of a fecal impaction. The patient has undergone previous lower lumbar fusion. There surgical clips in the gallbladder fossa. 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 IMPRESSION: The colonic stool burden is moderate and could reflect constipation. There is no evidence of obstruction. Electronically Signed   By: David  [place] M.D.   On: 12/25/2016 14:40    Assessment and Plan:   Yvette Collier is a 76 y.o. y/o female for follow up of f irritable bowel syndrome with constipaton being predominant. Likely worse by multiple medications that promote slow colonic motility such as oxycodone , diltiazem and lyrica. Previously also on Bentyl and  Lanetta Inchquestran.presently on metamucil and linzess daily she is having a bowel movement every day and says she is doing much better. I would suggest to continue these medicaitons long term and if possible wean off narcotics   F/u as needed.   Dr Wyline MoodKiran Belladonna Lubinski   MD

## 2017-02-26 ENCOUNTER — Telehealth: Payer: Self-pay

## 2017-02-26 NOTE — Telephone Encounter (Signed)
-----   Message from Wyline MoodKiran Anna, MD sent at 02/26/2017 11:27 AM EST ----- Regarding: RE: Pt Concern 1. Check if she is taking her linzess if she has stopped then restart and take daily  2. If not had a bowel movement in 2-3 days then take a bottle of magensium citrate over the counter 3. No NSAID's  Dr Wyline MoodKiran Anna  Gastroenterology/Hepatology Pager: [phone]  ----- Message ----- From: Ethlyn GalleryPanya Chaia Ikard, CMA Sent: 02/26/2017  10:51 AM To: Wyline MoodKiran Anna, MD Subject: Pt Concern                                     Pt called stating severe abd pain. Unable to eat or ambulate. Cites difficulty sleeping also. I've scheduled an appointment for next Monday.  Is there anything we can do for the patient until her appointment?

## 2017-02-26 NOTE — Telephone Encounter (Signed)
Advised pt of Dr. Johnney KillianAnna's recommendation.   Pt states that it feels like a pressure. She has to hold on to the walls to ambulate. Pain radiates to her back. This only occurs after eating.

## 2017-03-03 ENCOUNTER — Ambulatory Visit: Payer: Medicare Other | Admitting: Gastroenterology

## 2017-03-05 ENCOUNTER — Other Ambulatory Visit: Payer: Self-pay

## 2017-03-05 ENCOUNTER — Encounter: Payer: Self-pay | Admitting: Gastroenterology

## 2017-03-05 ENCOUNTER — Ambulatory Visit (INDEPENDENT_AMBULATORY_CARE_PROVIDER_SITE_OTHER): Payer: Medicare Other | Admitting: Gastroenterology

## 2017-03-05 ENCOUNTER — Telehealth: Payer: Self-pay

## 2017-03-05 VITALS — BP 137/77 | HR 65 | Temp 98.4°F | Ht 65.0 in | Wt 155.4 lb

## 2017-03-05 DIAGNOSIS — R109 Unspecified abdominal pain: Secondary | ICD-10-CM

## 2017-03-05 DIAGNOSIS — I2510 Atherosclerotic heart disease of native coronary artery without angina pectoris: Secondary | ICD-10-CM

## 2017-03-05 DIAGNOSIS — R1013 Epigastric pain: Secondary | ICD-10-CM

## 2017-03-05 DIAGNOSIS — R634 Abnormal weight loss: Secondary | ICD-10-CM | POA: Diagnosis not present

## 2017-03-05 DIAGNOSIS — G8929 Other chronic pain: Secondary | ICD-10-CM | POA: Diagnosis not present

## 2017-03-05 DIAGNOSIS — R194 Change in bowel habit: Secondary | ICD-10-CM

## 2017-03-05 MED ORDER — DICYCLOMINE HCL 10 MG PO CAPS: 10.0000 mg | ORAL_CAPSULE | Freq: Three times a day (TID) | ORAL | 0 refills | Status: DC

## 2017-03-05 MED ORDER — OMEPRAZOLE 40 MG PO CPDR
40.0000 mg | DELAYED_RELEASE_CAPSULE | Freq: Every day | ORAL | 3 refills | Status: DC
Start: 2017-03-05 — End: 2017-09-04

## 2017-03-05 NOTE — Telephone Encounter (Signed)
Gastroenterology Pre-Procedure Review  Request Date: 03/20/17 Requesting Physician: Dr. Tobi BastosAnna  PATIENT REVIEW QUESTIONS: The patient responded to the following health history questions as indicated:    1. Are you having any GI issues? yes (abd pain2) 2. Do you have a personal history of Polyps? yes (removed) 3. Do you have a family history of Colon Cancer or Polyps? no 4. 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 Joint replacements in the past 12 months?no 6. Major health problems in the past 3 months?no 7. Any artificial heart valves, MVP, or defibrillator?no    MEDICATIONS & ALLERGIES:    Patient reports the following regarding taking any anticoagulation/antiplatelet therapy:   Plavix, Coumadin, Eliquis, Xarelto, Lovenox, Pradaxa, Brilinta, or Effient? no Aspirin? yes (81mg )  Patient confirms/reports the following medications:  Current Outpatient Prescriptions  Medication Sig Dispense Refill  . aspirin EC 81 MG tablet Take 81 mg by mouth daily.    . cetirizine (ZYRTEC) 10 MG tablet Take 10 mg by mouth daily.    Marland Kitchen. DEXILANT 60 MG capsule     . dicyclomine (BENTYL) 10 MG capsule Take 1 capsule (10 mg total) by mouth 4 (four) times daily -  before meals and at bedtime. 90 capsule 0  . dicyclomine (BENTYL) 20 MG tablet     . diltiazem (CARDIZEM CD) 120 MG 24 hr capsule Take 1 capsule (120 mg total) by mouth daily after breakfast. 90 capsule 3  . glipiZIDE (GLUCOTROL XL) 2.5 MG 24 hr tablet Take 2.5 mg by mouth daily with breakfast.    . linaclotide (LINZESS) 145 MCG CAPS capsule Take 1 capsule (145 mcg total) by mouth daily before breakfast. 30 capsule 2  . losartan (COZAAR) 25 MG tablet Take 25 mg by mouth daily.     . metoprolol succinate (TOPROL-XL) 50 MG 24 hr tablet Take 1 tablet (50 mg total) by mouth daily. Take with or immediately following a meal. 90 tablet 1  . omeprazole (PRILOSEC) 40 MG capsule Take 1 capsule (40 mg total) by mouth daily. 90 capsule 3  . oxyCODONE (OXY  IR/ROXICODONE) 5 MG immediate release tablet Limit 1 tablet by mouth 2-3 times per day if tolerated 90 tablet 0  . pregabalin (LYRICA) 25 MG capsule Limit  1 tablet by mouth per day or twice per day if tolerated 60 capsule 0  . Probiotic Product (VSL#3) CAPS Take 1 capsule by mouth 2 (two) times daily. (Patient not taking: Reported on 03/05/2017) 60 capsule 11  . simvastatin (ZOCOR) 10 MG tablet Take 10 mg by mouth every evening.     . Vitamin D, Ergocalciferol, (DRISDOL) 50000 units CAPS capsule Take 50,000 Units by mouth every 7 (seven) days.     Current Facility-Administered Medications  Medication Dose Route Frequency Provider Last Rate Last Dose  . ceFAZolin (ANCEF) IVPB 1 g/50 mL premix  1 g Intravenous Once Ewing SchleinGregory Crisp, MD      . ceFAZolin (ANCEF) IVPB 1 g/50 mL premix  1 g Intravenous Once Ewing SchleinGregory Crisp, MD      . ceFAZolin (ANCEF) IVPB 1 g/50 mL premix  1 g Intravenous Once Ewing SchleinGregory Crisp, MD      . lactated ringers infusion 1,000 mL  1,000 mL Intravenous Continuous Ewing SchleinGregory Crisp, MD      . lactated ringers infusion 1,000 mL  1,000 mL Intravenous Continuous Ewing SchleinGregory Crisp, MD 125 mL/hr at 08/14/16 0819 1,000 mL at 08/14/16 0819  . orphenadrine (NORFLEX) injection 60 mg  60 mg Intramuscular Once Ewing SchleinGregory Crisp, MD      .  orphenadrine (NORFLEX) injection 60 mg  60 mg Intramuscular Once Ewing Schlein, MD        Patient confirms/reports the following allergies:  Allergies  Allergen Reactions  . Effexor [Venlafaxine] Hives  . Ivp Dye [Iodinated Diagnostic Agents] Hives  . Lisinopril Swelling and Other (See Comments)    Reaction:  Tongue/mouth swelling   . Sulfa Antibiotics Hives    No orders of the defined types were placed in this encounter.   AUTHORIZATION INFORMATION Primary Insurance: 1D#: Group #:  Secondary Insurance: 1D#: Group #:  SCHEDULE INFORMATION: Date: 03/20/17 Time: Location: [facility]

## 2017-03-05 NOTE — Progress Notes (Signed)
Primary Care Physician: Hyman Hopes, MD  Primary Gastroenterologist:  Dr. Wyline Mood   No chief complaint on file.   HPI: KRISSIE MERRICK is a 76 y.o. female   She is here today as she called a few days back with abdominal "pressure". She was last seen on 01/21/17 for abdominal pain of 34weeks duration , better after a bowel movement , peppermint oil . Her history also suggested constipation which was long standing.She was on questran, lyrica,diltiazem,bentyl  and oxycodone.  I obtained an X ray after her office visit which suggested moderate stool burden. I commenced her on daily metamucil , linzess 145 mcg on 01/06/17 which she takes daily.  Interval history 01/21/17-02/20/17  She sees since last visit . Taking the metamucil, also taking linzess, having a daily bowel movement , soft.   The reason she is here today is for abdominal pain  Abdominal pain: Onset: Started after her last visit, changed her eating habits and says when her food hits her stomach , begins after 15 minutes, the pain lasts for an hour  Site :center of her abdomen  Radiation: localized , very sharp, she recalls having had these pains in the past which was helpful  Ashby Dawes of pain: squeezing Aggravating factors: when she stoops , eats food  Relieving factors :not moving , having a bowel movement helps Weight loss: yes 3-4 lbs  NSAID use: no  PPI use :no  Gall bladder surgery: thinks been taken out   She recalls having had polyps in her colon    Current Outpatient Prescriptions  Medication Sig Dispense Refill  . aspirin EC 81 MG tablet Take 81 mg by mouth daily.    . cetirizine (ZYRTEC) 10 MG tablet Take 10 mg by mouth daily.    Marland Kitchen DEXILANT 60 MG capsule     . dicyclomine (BENTYL) 20 MG tablet Take 20 mg by mouth 3 (three) times daily before meals.    Marland Kitchen diltiazem (CARDIZEM CD) 120 MG 24 hr capsule Take 1 capsule (120 mg total) by mouth daily after breakfast. 90 capsule 3  . glipiZIDE (GLUCOTROL XL) 2.5 MG  24 hr tablet Take 2.5 mg by mouth daily with breakfast.    . linaclotide (LINZESS) 145 MCG CAPS capsule Take 1 capsule (145 mcg total) by mouth daily before breakfast. 30 capsule 2  . losartan (COZAAR) 25 MG tablet Take 25 mg by mouth daily.     . metoprolol succinate (TOPROL-XL) 50 MG 24 hr tablet Take 1 tablet (50 mg total) by mouth daily. Take with or immediately following a meal. 90 tablet 1  . oxyCODONE (OXY IR/ROXICODONE) 5 MG immediate release tablet Limit 1 tablet by mouth 2-3 times per day if tolerated 90 tablet 0  . pregabalin (LYRICA) 25 MG capsule Limit  1 tablet by mouth per day or twice per day if tolerated 60 capsule 0  . Probiotic Product (VSL#3) CAPS Take 1 capsule by mouth 2 (two) times daily. 60 capsule 11  . simvastatin (ZOCOR) 10 MG tablet Take 10 mg by mouth every evening.     . Vitamin D, Ergocalciferol, (DRISDOL) 50000 units CAPS capsule Take 50,000 Units by mouth every 7 (seven) days.     Current Facility-Administered Medications  Medication Dose Route Frequency Provider Last Rate Last Dose  . ceFAZolin (ANCEF) IVPB 1 g/50 mL premix  1 g Intravenous Once Ewing Schlein, MD      . ceFAZolin (ANCEF) IVPB 1 g/50 mL premix  1 g Intravenous  Once Ewing SchleinGregory Crisp, MD      . ceFAZolin (ANCEF) IVPB 1 g/50 mL premix  1 g Intravenous Once Ewing SchleinGregory Crisp, MD      . lactated ringers infusion 1,000 mL  1,000 mL Intravenous Continuous Ewing SchleinGregory Crisp, MD      . lactated ringers infusion 1,000 mL  1,000 mL Intravenous Continuous Ewing SchleinGregory Crisp, MD 125 mL/hr at 08/14/16 0819 1,000 mL at 08/14/16 0819  . orphenadrine (NORFLEX) injection 60 mg  60 mg Intramuscular Once Ewing SchleinGregory Crisp, MD      . orphenadrine (NORFLEX) injection 60 mg  60 mg Intramuscular Once Ewing SchleinGregory Crisp, MD        Allergies as of 03/05/2017 - Review Complete 01/21/2017  Allergen Reaction Noted  . Effexor [venlafaxine] Hives 07/14/2013  . Ivp dye [iodinated diagnostic agents] Hives 07/14/2013  . Lisinopril Swelling and Other  (See Comments) 07/13/2015  . Sulfa antibiotics Hives 07/14/2013    ROS:  General: Negative for anorexia, weight loss, fever, chills, fatigue, weakness. ENT: Negative for hoarseness, difficulty swallowing , nasal congestion. CV: Negative for chest pain, angina, palpitations, dyspnea on exertion, peripheral edema.  Respiratory: Negative for dyspnea at rest, dyspnea on exertion, cough, sputum, wheezing.  GI: See history of present illness. GU:  Negative for dysuria, hematuria, urinary incontinence, urinary frequency, nocturnal urination.  Endo: Negative for unusual weight change.    Physical Examination:   There were no vitals taken for this visit.  General: Well-nourished, well-developed in no acute distress.  Eyes: No icterus. Conjunctivae pink. Mouth: Oropharyngeal mucosa moist and pink , no lesions erythema or exudate. Lungs: Clear to auscultation bilaterally. Non-labored. Heart: Regular rate and rhythm, no murmurs rubs or gallops.  Abdomen: Bowel sounds are normal, nontender, nondistended, no hepatosplenomegaly or masses, no abdominal bruits or hernia , no rebound or guarding.   Extremities: No lower extremity edema. No clubbing or deformities. Neuro: Alert and oriented x 3.  Grossly intact. Skin: Warm and dry, no jaundice.   Psych: Alert and cooperative, normal mood and affect.    Imaging Studies: No results found.  Assessment and Plan:    Tomma RakersMary L Dorer is a 76 y.o. y/o female, she has a history of irritable bowel syndrome with constipaton being predominant. Doing well on linzess and metamucil having bowel movements daily. She is here for new symptoms of umbilical pain which is worse recently and some weight loss. It may very well all be related to her IBS but with some weight loss, would suggest imaging and endoscopy with some lab tests .    Plan  1. EGD+colonoscopy due to change in bowel habits, weight loss, abdominal pain  2. CT abdomen  3. PPI  4. Bentyl  5.  CBCD,BMP,LFT   Dr Wyline MoodKiran Omid Deardorff  MD Follow up in 4 weeks

## 2017-03-11 ENCOUNTER — Ambulatory Visit
Admission: RE | Admit: 2017-03-11 | Discharge: 2017-03-11 | Disposition: A | Discharge status: Home / Self Care | Payer: Medicare Other | Source: Ambulatory Visit | Attending: Gastroenterology | Admitting: Gastroenterology

## 2017-03-11 DIAGNOSIS — R109 Unspecified abdominal pain: Secondary | ICD-10-CM | POA: Diagnosis present

## 2017-03-11 DIAGNOSIS — Z90710 Acquired absence of both cervix and uterus: Secondary | ICD-10-CM | POA: Insufficient documentation

## 2017-03-11 DIAGNOSIS — Z9049 Acquired absence of other specified parts of digestive tract: Secondary | ICD-10-CM | POA: Diagnosis not present

## 2017-03-11 DIAGNOSIS — R634 Abnormal weight loss: Secondary | ICD-10-CM | POA: Diagnosis present

## 2017-03-19 ENCOUNTER — Telehealth: Payer: Self-pay

## 2017-03-19 NOTE — Telephone Encounter (Signed)
-----   Message from Wyline MoodKiran Anna, MD sent at 03/18/2017  4:45 PM EDT ----- No acute pathology in abdomen

## 2017-03-19 NOTE — Telephone Encounter (Signed)
Advised pt of results per Dr. Tobi BastosAnna.   Pt scheduled to have colonoscopy 03/20/17

## 2017-03-20 ENCOUNTER — Ambulatory Visit
Admission: RE | Admit: 2017-03-20 | Discharge: 2017-03-20 | Disposition: A | Discharge status: Home / Self Care | Payer: Medicare Other | Source: Ambulatory Visit | Attending: Gastroenterology | Admitting: Gastroenterology

## 2017-03-20 ENCOUNTER — Ambulatory Visit: Payer: Medicare Other | Admitting: Certified Registered"

## 2017-03-20 ENCOUNTER — Encounter
Admission: RE | Disposition: A | Discharge status: Home / Self Care | Payer: Self-pay | Source: Ambulatory Visit | Attending: Gastroenterology

## 2017-03-20 ENCOUNTER — Encounter: Payer: Self-pay | Admitting: *Deleted

## 2017-03-20 DIAGNOSIS — Z96642 Presence of left artificial hip joint: Secondary | ICD-10-CM | POA: Diagnosis not present

## 2017-03-20 DIAGNOSIS — K640 First degree hemorrhoids: Secondary | ICD-10-CM | POA: Insufficient documentation

## 2017-03-20 DIAGNOSIS — Z79899 Other long term (current) drug therapy: Secondary | ICD-10-CM | POA: Diagnosis not present

## 2017-03-20 DIAGNOSIS — J449 Chronic obstructive pulmonary disease, unspecified: Secondary | ICD-10-CM | POA: Diagnosis not present

## 2017-03-20 DIAGNOSIS — Z86010 Personal history of colonic polyps: Secondary | ICD-10-CM | POA: Insufficient documentation

## 2017-03-20 DIAGNOSIS — I480 Paroxysmal atrial fibrillation: Secondary | ICD-10-CM | POA: Insufficient documentation

## 2017-03-20 DIAGNOSIS — K219 Gastro-esophageal reflux disease without esophagitis: Secondary | ICD-10-CM | POA: Diagnosis not present

## 2017-03-20 DIAGNOSIS — R634 Abnormal weight loss: Secondary | ICD-10-CM | POA: Diagnosis not present

## 2017-03-20 DIAGNOSIS — I119 Hypertensive heart disease without heart failure: Secondary | ICD-10-CM | POA: Insufficient documentation

## 2017-03-20 DIAGNOSIS — Z7984 Long term (current) use of oral hypoglycemic drugs: Secondary | ICD-10-CM | POA: Diagnosis not present

## 2017-03-20 DIAGNOSIS — R1013 Epigastric pain: Secondary | ICD-10-CM | POA: Insufficient documentation

## 2017-03-20 DIAGNOSIS — E119 Type 2 diabetes mellitus without complications: Secondary | ICD-10-CM | POA: Diagnosis not present

## 2017-03-20 DIAGNOSIS — R194 Change in bowel habit: Secondary | ICD-10-CM | POA: Insufficient documentation

## 2017-03-20 DIAGNOSIS — Z9049 Acquired absence of other specified parts of digestive tract: Secondary | ICD-10-CM | POA: Insufficient documentation

## 2017-03-20 DIAGNOSIS — I2510 Atherosclerotic heart disease of native coronary artery without angina pectoris: Secondary | ICD-10-CM | POA: Diagnosis not present

## 2017-03-20 DIAGNOSIS — R109 Unspecified abdominal pain: Secondary | ICD-10-CM

## 2017-03-20 DIAGNOSIS — K2950 Unspecified chronic gastritis without bleeding: Secondary | ICD-10-CM | POA: Insufficient documentation

## 2017-03-20 DIAGNOSIS — K3189 Other diseases of stomach and duodenum: Secondary | ICD-10-CM | POA: Insufficient documentation

## 2017-03-20 HISTORY — PX: ESOPHAGOGASTRODUODENOSCOPY (EGD) WITH PROPOFOL: SHX5813

## 2017-03-20 HISTORY — PX: COLONOSCOPY WITH PROPOFOL: SHX5780

## 2017-03-20 LAB — GLUCOSE, CAPILLARY: GLUCOSE-CAPILLARY: 102 mg/dL — AB (ref 65–99)

## 2017-03-20 SURGERY — COLONOSCOPY WITH PROPOFOL
Anesthesia: General

## 2017-03-20 MED ORDER — LIDOCAINE HCL (CARDIAC) 20 MG/ML IV SOLN
INTRAVENOUS | Status: DC | PRN
  Administered 2017-03-20: 70 mg via INTRATRACHEAL

## 2017-03-20 MED ORDER — GLYCOPYRROLATE 0.2 MG/ML IJ SOLN
INTRAMUSCULAR | Status: DC | PRN
  Administered 2017-03-20: 0.2 mg via INTRAVENOUS

## 2017-03-20 MED ORDER — SODIUM CHLORIDE 0.9 % IV SOLN
INTRAVENOUS | Status: DC
  Administered 2017-03-20: 10:00:00 via INTRAVENOUS

## 2017-03-20 MED ORDER — PROPOFOL 500 MG/50ML IV EMUL
INTRAVENOUS | Status: DC | PRN
  Administered 2017-03-20: 150 ug/kg/min via INTRAVENOUS

## 2017-03-20 MED ORDER — FENTANYL CITRATE (PF) 100 MCG/2ML IJ SOLN
INTRAMUSCULAR | Status: DC | PRN
  Administered 2017-03-20: 50 ug via INTRAVENOUS

## 2017-03-20 MED ORDER — FENTANYL CITRATE (PF) 100 MCG/2ML IJ SOLN
INTRAMUSCULAR | Status: AC
  Filled 2017-03-20: qty 2

## 2017-03-20 MED ORDER — GLYCOPYRROLATE 0.2 MG/ML IJ SOLN
INTRAMUSCULAR | Status: AC
  Filled 2017-03-20: qty 1

## 2017-03-20 MED ORDER — LIDOCAINE HCL (PF) 2 % IJ SOLN
INTRAMUSCULAR | Status: AC
  Filled 2017-03-20: qty 2

## 2017-03-20 MED ORDER — PROPOFOL 10 MG/ML IV BOLUS
INTRAVENOUS | Status: DC | PRN
  Administered 2017-03-20: 20 mg via INTRAVENOUS
  Administered 2017-03-20: 50 mg via INTRAVENOUS

## 2017-03-20 NOTE — Anesthesia Procedure Notes (Signed)
Performed by: Anzleigh Slaven Pre-anesthesia Checklist: Patient identified, Emergency Drugs available, Suction available, Patient being monitored and Timeout performed Patient Re-evaluated:Patient Re-evaluated prior to inductionOxygen Delivery Method: Nasal cannula Preoxygenation: Pre-oxygenation with 100% oxygen Intubation Type: IV induction       

## 2017-03-20 NOTE — Op Note (Signed)
[facility] Gastroenterology Patient Name: Yvette PerchMary Bentsen Procedure Date: 03/20/2017 9:55 AM MRN: [id number] Account #: [id number] Date of Birth: 23-Jun-1941 Admit Type: Outpatient Age: [id number] [facility] Gender: Female Note Status: Finalized Procedure:            Colonoscopy Indications:          Change in bowel habits Providers:            Wyline MoodKiran Ashiya Kinkead MD, MD Medicines:            Monitored Anesthesia Care Complications:        No immediate complications. Procedure:            Pre-Anesthesia Assessment:                       - Prior to the procedure, a History and Physical was                        performed, and patient medications, allergies and                        sensitivities were reviewed. The patient's tolerance of                        previous anesthesia was reviewed.                       - The risks and benefits of the procedure and the                        sedation options and risks were discussed with the                        patient. All questions were answered and informed                        consent was obtained.                       - ASA Grade Assessment: III - A patient with severe                        systemic disease.                       After obtaining informed consent, the colonoscope was                        passed under direct vision. Throughout the procedure,                        the patient's blood pressure, pulse, and oxygen                        saturations were monitored continuously. The                        Colonoscope was introduced through the anus and                        advanced to the the cecum, identified by the  appendiceal orifice, IC valve and transillumination.                        The colonoscopy was performed with ease. The patient                        tolerated the procedure well. The quality of the bowel                        preparation was good. Findings:   The perianal and digital rectal examinations were normal.      Non-bleeding internal hemorrhoids were found during retroflexion. The       hemorrhoids were small and Grade I (internal hemorrhoids that do not       prolapse).      No additional abnormalities were found on retroflexion. Impression:           - Non-bleeding internal hemorrhoids.                       - No specimens collected. Recommendation:       - Discharge patient to home (with escort).                       - Resume previous diet.                       - Continue present medications.                       - Return to my office in 4 weeks. Procedure Code(s):    --- Professional ---                       [phone], Colonoscopy, flexible; diagnostic, including                        collection of specimen(s) by brushing or washing, when                        performed (separate procedure) Diagnosis Code(s):    --- Professional ---                       K64.0, First degree hemorrhoids                       R19.4, Change in bowel habit CPT copyright 2016 American Medical Association. All rights reserved. The codes documented in this report are preliminary and upon coder review may  be revised to meet current compliance requirements. Wyline Mood, MD Wyline Mood MD, MD 03/20/2017 10:38:43 AM This report has been signed electronically. Number of Addenda: 0 Note Initiated On: 03/20/2017 9:55 AM Scope Withdrawal Time: 0 hours 11 minutes 20 seconds  Total Procedure Duration: 0 hours 18 minutes 11 seconds       [facility]

## 2017-03-20 NOTE — Op Note (Signed)
[facility] Gastroenterology Patient Name: Yvette Collier Procedure Date: 03/20/2017 9:56 AM MRN: [id number] Account #: [id number] Date of Birth: July 27, 1941 Admit Type: Outpatient Age: 76 [facility] 4 Gender: Female Note Status: Finalized Procedure:            Upper GI endoscopy Indications:          Dyspepsia Providers:            Wyline MoodKiran Davonne Baby MD, MD Referring MD:         Renne MuscaHarriet P. Burns MD, MD (Referring MD) Medicines:            Monitored Anesthesia Care Complications:        No immediate complications. Procedure:            Pre-Anesthesia Assessment:                       - Prior to the procedure, a History and Physical was                        performed, and patient medications, allergies and                        sensitivities were reviewed. The patient's tolerance of                        previous anesthesia was reviewed.                       - The risks and benefits of the procedure and the                        sedation options and risks were discussed with the                        patient. All questions were answered and informed                        consent was obtained.                       - ASA Grade Assessment: III - A patient with severe                        systemic disease.                       After obtaining informed consent, the endoscope was                        passed under direct vision. Throughout the procedure,                        the patient's blood pressure, pulse, and oxygen                        saturations were monitored continuously. The Endoscope                        was introduced through the mouth, and advanced to the  third part of duodenum. The upper GI endoscopy was                        accomplished with ease. The patient tolerated the                        procedure well. Findings:      The esophagus was normal.      The entire examined stomach was normal. Biopsies were  taken with a cold       forceps for histology.      Localized mild mucosal changes characterized by an increased vascular       pattern were found in the first portion of the duodenum. Biopsies were       taken with a cold forceps for histology. Impression:           - Normal esophagus.                       - Normal stomach. Biopsied.                       - Mucosal changes in the duodenum. Biopsied. Recommendation:       - Discharge patient to home (with escort).                       - Await pathology results.                       - Perform a colonoscopy today. Procedure Code(s):    --- Professional ---                       [phone], Esophagogastroduodenoscopy, flexible, transoral;                        with biopsy, single or multiple Diagnosis Code(s):    --- Professional ---                       K31.89, Other diseases of stomach and duodenum                       R10.13, Epigastric pain CPT copyright 2016 American Medical Association. All rights reserved. The codes documented in this report are preliminary and upon coder review may  be revised to meet current compliance requirements. Wyline Mood, MD Wyline Mood MD, MD 03/20/2017 10:16:00 AM This report has been signed electronically. Number of Addenda: 0 Note Initiated On: 03/20/2017 9:56 AM      [facility]

## 2017-03-20 NOTE — Transfer of Care (Signed)
Immediate Anesthesia Transfer of Care Note  Patient: Noni L Moller  Procedure(s) Performed: Procedure(s): COLONOSCOPY WITH PROPOFOL (N/A) ESOPHAGOGASTRODUODENOSCOPY (EGD) WITH PROPOFOL (N/A)  Patient Location: PACU  Anesthesia Type:General  Level of Consciousness: sedated and responds to stimulation  Airway & Oxygen Therapy: Patient Spontanous Breathing and Patient connected to nasal cannula oxygen  Post-op Assessment: Report given to RN and Post -op Vital signs reviewed and stable  Post vital signs: Reviewed and stable  Last Vitals:  Vitals:   03/20/17 0909 03/20/17 1041  BP: (!) 155/86 (!) 103/57  Pulse: 82 87  Resp: 16 15  Temp: 36.4 C     Last Pain:  Vitals:   03/20/17 0909  TempSrc: Tympanic         Complications: No apparent anesthesia complications

## 2017-03-20 NOTE — H&P (Signed)
Yvette Mood MD [address], [address] [place], [place] [address] Phone: [phone] Fax : [phone]  Primary Care Physician:  Hyman Hopes, MD Primary Gastroenterologist:  Dr. Wyline Mood   Pre-Procedure History & Physical: HPI:  Yvette Collier is a 76 y.o. female is here for an endoscopy and colonoscopy.   Past Medical History:  Diagnosis Date  . Asthma   . Chronic lower back pain   . COPD (chronic obstructive pulmonary disease) (HCC)   . Diabetes mellitus without complication (HCC)   . Gastrointestinal bleed   . GERD (gastroesophageal reflux disease)   . History of colon polyps   . Hypertension   . Hypertensive heart disease   . IBS (irritable bowel syndrome)   . Ischemic colitis (HCC)   . Non-obstructive Coronary Artery Disease    a. 05/2009 [facility]): minimal nonobs dzs.  . Osteoarthritis   . PAF (paroxysmal atrial fibrillation) (HCC)    a. 09/2014 during GI illness;  b. CHA2DS2VASc = 5 (not currently on OAC 2/2 h/o GIB/ischemic colitis); c. 09/2014 Echo: EF 60-65%, imparied relaxation, nl RV size/fxn.  . Transient cerebral ischemia due to atrial fibrillation [facility])     Past Surgical History:  Procedure Laterality Date  . ABDOMINAL HYSTERECTOMY    . APPENDECTOMY    . BACK SURGERY     x 3, last 2004.  . CHOLECYSTECTOMY    . FOOT SURGERY Right   . HEMORRHOID BANDING    . left total hip arthroplasty  2012  . STOMACH SURGERY    . TONSILLECTOMY      Prior to Admission medications   Medication Sig Start Date End Date Taking? Authorizing Provider  cetirizine (ZYRTEC) 10 MG tablet Take 10 mg by mouth daily.   Yes Historical Provider, MD  DEXILANT 60 MG capsule  01/01/17  Yes Historical Provider, MD  diltiazem (CARDIZEM CD) 120 MG 24 hr capsule Take 1 capsule (120 mg total) by mouth daily after breakfast. 08/01/16  Yes Antonieta Iba, MD  glipiZIDE (GLUCOTROL XL) 2.5 MG 24 hr tablet Take 2.5 mg by mouth daily with breakfast.   Yes Historical Provider, MD    linaclotide (LINZESS) 145 MCG CAPS capsule Take 1 capsule (145 mcg total) by mouth daily before breakfast. 01/06/17  Yes Yvette Mood, MD  losartan (COZAAR) 25 MG tablet Take 25 mg by mouth daily.    Yes Historical Provider, MD  metoprolol succinate (TOPROL-XL) 50 MG 24 hr tablet Take 1 tablet (50 mg total) by mouth daily. Take with or immediately following a meal. 01/15/17  Yes Antonieta Iba, MD  omeprazole (PRILOSEC) 40 MG capsule Take 1 capsule (40 mg total) by mouth daily. 03/05/17  Yes Yvette Mood, MD  pregabalin (LYRICA) 25 MG capsule Limit  1 tablet by mouth per day or twice per day if tolerated 08/14/16  Yes Ewing Schlein, MD  simvastatin (ZOCOR) 10 MG tablet Take 10 mg by mouth every evening.    Yes Historical Provider, MD  Vitamin D, Ergocalciferol, (DRISDOL) 50000 units CAPS capsule Take 50,000 Units by mouth every 7 (seven) days.   Yes Historical Provider, MD  aspirin EC 81 MG tablet Take 81 mg by mouth daily.    Historical Provider, MD  dicyclomine (BENTYL) 10 MG capsule Take 1 capsule (10 mg total) by mouth 4 (four) times daily -  before meals and at bedtime. 03/05/17 04/04/17  Yvette Mood, MD  dicyclomine (BENTYL) 20 MG tablet  02/26/17   Historical Provider, MD  oxyCODONE (  OXY IR/ROXICODONE) 5 MG immediate release tablet Limit 1 tablet by mouth 2-3 times per day if tolerated 08/14/16   Ewing SchleinGregory Crisp, MD  Probiotic Product (VSL#3) CAPS Take 1 capsule by mouth 2 (two) times daily. Patient not taking: Reported on 03/05/2017 06/13/16   Midge Miniumarren Wohl, MD    Allergies as of 03/05/2017 - Review Complete 03/05/2017  Allergen Reaction Noted  . Effexor [venlafaxine] Hives 07/14/2013  . Ivp dye [iodinated diagnostic agents] Hives 07/14/2013  . Lisinopril Swelling and Other (See Comments) 07/13/2015  . Sulfa antibiotics Hives 07/14/2013    Family History  Problem Relation Age of Onset  . Heart attack Mother   . Hypertension Mother   . Heart disease Father   . Hypertension Father   . Hypertension  Brother   . Hypertension Maternal Aunt     Social History   Social History  . Marital status: Divorced    Spouse name: N/A  . Number of children: N/A  . Years of education: N/A   Occupational History  . Not on file.   Social History Main Topics  . Smoking status: Never Smoker  . Smokeless tobacco: Never Used  . Alcohol use No  . Drug use: No  . Sexual activity: Not on file   Other Topics Concern  . Not on file   Social History Narrative  . No narrative on file    Review of Systems: See HPI, otherwise negative ROS  Physical Exam: BP (!) 155/86   Pulse 82   Temp 97.5 F (36.4 C) (Tympanic)   Resp 16   Ht 5\' 5"  (1.651 m)   Wt 150 lb (68 kg)   SpO2 99%   BMI 24.96 kg/m  General:   Alert,  pleasant and cooperative in NAD Head:  Normocephalic and atraumatic. Neck:  Supple; no masses or thyromegaly. Lungs:  Clear throughout to auscultation.    Heart:  Regular rate and rhythm. Abdomen:  Soft, nontender and nondistended. Normal bowel sounds, without guarding, and without rebound.   Neurologic:  Alert and  oriented x4;  grossly normal neurologically.  Impression/Plan: Yvette Collier is here for an endoscopy and colonoscopy to be performed for abdominal pain , change in bowel habits  Risks, benefits, limitations, and alternatives regarding  endoscopy and colonoscopy have been reviewed with the patient.  Questions have been answered.  All parties agreeable.   Yvette MoodKiran Shenequa Howse, MD  03/20/2017, 9:56 AM

## 2017-03-20 NOTE — Anesthesia Preprocedure Evaluation (Signed)
Anesthesia Evaluation  Patient identified by MRN, date of birth, ID band Patient awake    Reviewed: Allergy & Precautions, H&P , NPO status , Patient's Chart, lab work & pertinent test results, reviewed documented beta blocker date and time   Airway Mallampati: II   Neck ROM: full    Dental  (+) Poor Dentition   Pulmonary neg pulmonary ROS, asthma , COPD,    Pulmonary exam normal        Cardiovascular hypertension, + CAD  negative cardio ROS Normal cardiovascular exam Rhythm:regular Rate:Normal     Neuro/Psych  Neuromuscular disease negative neurological ROS  negative psych ROS   GI/Hepatic negative GI ROS, Neg liver ROS, GERD  Medicated,  Endo/Other  negative endocrine ROSdiabetes  Renal/GU negative Renal ROS  negative genitourinary   Musculoskeletal   Abdominal   Peds  Hematology negative hematology ROS (+)   Anesthesia Other Findings Past Medical History: No date: Asthma No date: Chronic lower back pain No date: COPD (chronic obstructive pulmonary disease) (* No date: Diabetes mellitus without complication (HCC) No date: Gastrointestinal bleed No date: GERD (gastroesophageal reflux disease) No date: History of colon polyps No date: Hypertension No date: Hypertensive heart disease No date: IBS (irritable bowel syndrome) No date: Ischemic colitis (HCC) No date: Non-obstructive Coronary Artery Disease     Comment: a. 05/2009 [facility] minimal nonobs dzs. No date: Osteoarthritis No date: PAF (paroxysmal atrial fibrillation) (HCC)     Comment: a. 09/2014 during GI illness;  b. CHA2DS2VASc               = 5 (not currently on OAC 2/2 h/o GIB/ischemic               colitis); c. 09/2014 Echo: EF 60-65%, imparied               relaxation, nl RV size/fxn. No date: Transient cerebral ischemia due to atrial fibr* Past Surgical History: No date: ABDOMINAL HYSTERECTOMY No date: APPENDECTOMY No date: BACK  SURGERY     Comment: x 3, last 2004. No date: CHOLECYSTECTOMY No date: FOOT SURGERY Right No date: HEMORRHOID BANDING 2012: left total hip arthroplasty No date: STOMACH SURGERY No date: TONSILLECTOMY BMI    Body Mass Index:  24.96 kg/m     Reproductive/Obstetrics negative OB ROS                             Anesthesia Physical Anesthesia Plan  ASA: III  Anesthesia Plan: General   Post-op Pain Management:    Induction:   Airway Management Planned:   Additional Equipment:   Intra-op Plan:   Post-operative Plan:   Informed Consent: I have reviewed the patients History and Physical, chart, labs and discussed the procedure including the risks, benefits and alternatives for the proposed anesthesia with the patient or authorized representative who has indicated his/her understanding and acceptance.   Dental Advisory Given  Plan Discussed with: CRNA  Anesthesia Plan Comments:         Anesthesia Quick Evaluation

## 2017-03-20 NOTE — Anesthesia Postprocedure Evaluation (Signed)
Anesthesia Post Note  Patient: Yvette Collier  Procedure(s) Performed: Procedure(s) (LRB): COLONOSCOPY WITH PROPOFOL (N/A) ESOPHAGOGASTRODUODENOSCOPY (EGD) WITH PROPOFOL (N/A)  Patient location during evaluation: PACU Anesthesia Type: General Level of consciousness: awake and alert Pain management: pain level controlled Vital Signs Assessment: post-procedure vital signs reviewed and stable Respiratory status: spontaneous breathing, nonlabored ventilation, respiratory function stable and patient connected to nasal cannula oxygen Cardiovascular status: blood pressure returned to baseline and stable Postop Assessment: no signs of nausea or vomiting Anesthetic complications: no     Last Vitals:  Vitals:   03/20/17 1100 03/20/17 1110  BP: 127/73 (!) 141/71  Pulse: 92 92  Resp: (!) 26 (!) 22  Temp:      Last Pain:  Vitals:   03/20/17 0909  TempSrc: Tympanic                 Yevette EdwardsJames G Breniyah Romm

## 2017-03-20 NOTE — Anesthesia Post-op Follow-up Note (Cosign Needed)
Anesthesia QCDR form completed.        

## 2017-03-21 ENCOUNTER — Encounter: Payer: Self-pay | Admitting: Gastroenterology

## 2017-03-21 LAB — SURGICAL PATHOLOGY

## 2017-03-24 ENCOUNTER — Encounter: Payer: Self-pay | Admitting: Gastroenterology

## 2017-04-02 ENCOUNTER — Other Ambulatory Visit
Admission: RE | Admit: 2017-04-02 | Discharge: 2017-04-02 | Disposition: A | Discharge status: Home / Self Care | Payer: Medicare Other | Source: Ambulatory Visit | Attending: Gastroenterology | Admitting: Gastroenterology

## 2017-04-02 ENCOUNTER — Ambulatory Visit (INDEPENDENT_AMBULATORY_CARE_PROVIDER_SITE_OTHER): Payer: Medicare Other | Admitting: Gastroenterology

## 2017-04-02 ENCOUNTER — Encounter: Payer: Self-pay | Admitting: Gastroenterology

## 2017-04-02 VITALS — BP 142/65 | HR 61 | Temp 98.5°F | Ht 65.0 in | Wt 155.2 lb

## 2017-04-02 DIAGNOSIS — K30 Functional dyspepsia: Secondary | ICD-10-CM

## 2017-04-02 DIAGNOSIS — R1013 Epigastric pain: Secondary | ICD-10-CM | POA: Insufficient documentation

## 2017-04-02 DIAGNOSIS — R634 Abnormal weight loss: Secondary | ICD-10-CM | POA: Diagnosis present

## 2017-04-02 DIAGNOSIS — I2510 Atherosclerotic heart disease of native coronary artery without angina pectoris: Secondary | ICD-10-CM

## 2017-04-02 LAB — BASIC METABOLIC PANEL
Anion gap: 8 (ref 5–15)
BUN: 21 mg/dL — AB (ref 6–20)
CHLORIDE: 105 mmol/L (ref 101–111)
CO2: 25 mmol/L (ref 22–32)
CREATININE: 1.01 mg/dL — AB (ref 0.44–1.00)
Calcium: 9.6 mg/dL (ref 8.9–10.3)
GFR calc Af Amer: >60 mL/min (ref >60)
GFR calc non Af Amer: 52 mL/min — ABNORMAL LOW (ref >60)
GLUCOSE: 113 mg/dL — AB (ref 65–99)
POTASSIUM: 4.2 mmol/L (ref 3.5–5.1)
SODIUM: 138 mmol/L (ref 135–145)

## 2017-04-02 LAB — HEPATIC FUNCTION PANEL
ALBUMIN: 4.2 g/dL (ref 3.5–5.0)
ALK PHOS: 106 U/L (ref 38–126)
ALT: 13 U/L — AB (ref 14–54)
AST: 19 U/L (ref 15–41)
BILIRUBIN TOTAL: 0.5 mg/dL (ref 0.3–1.2)
Bilirubin, Direct: <0.1 mg/dL — ABNORMAL LOW (ref 0.1–0.5)
Total Protein: 7.7 g/dL (ref 6.5–8.1)

## 2017-04-02 LAB — CBC WITH DIFFERENTIAL/PLATELET
Basophils Absolute: 0.1 10*3/uL (ref 0–0.1)
Basophils Relative: 1 %
EOS ABS: 0.1 10*3/uL (ref 0–0.7)
EOS PCT: 3 %
HCT: 33.7 % — ABNORMAL LOW (ref 35.0–47.0)
Hemoglobin: 11.2 g/dL — ABNORMAL LOW (ref 12.0–16.0)
LYMPHS ABS: 1.3 10*3/uL (ref 1.0–3.6)
LYMPHS PCT: 26 %
MCH: 30.2 pg (ref 26.0–34.0)
MCHC: 33.2 g/dL (ref 32.0–36.0)
MCV: 91 fL (ref 80.0–100.0)
MONO ABS: 0.3 10*3/uL (ref 0.2–0.9)
MONOS PCT: 6 %
Neutro Abs: 3.1 10*3/uL (ref 1.4–6.5)
Neutrophils Relative %: 64 %
PLATELETS: 286 10*3/uL (ref 150–440)
RBC: 3.7 MIL/uL — ABNORMAL LOW (ref 3.80–5.20)
RDW: 13.4 % (ref 11.5–14.5)
WBC: 4.8 10*3/uL (ref 3.6–11.0)

## 2017-04-02 LAB — TSH: TSH: 0.638 u[IU]/mL (ref 0.350–4.500)

## 2017-04-02 MED ORDER — SUCRALFATE 1 GM/10ML PO SUSP: 1.0000 g | Freq: Four times a day (QID) | ORAL | 1 refills | Status: DC

## 2017-04-02 NOTE — Progress Notes (Signed)
Primary Care Physician: Hyman Hopes, MD  Primary Gastroenterologist:  Dr. Wyline Mood   Chief Complaint  Patient presents with  . Abdominal Pain    HPI: Yvette Collier is a 76 y.o. female here to follow up for abdominal pain.   Summary of history : She has a history of  constipation which is long standing.She was on questran, lyrica,diltiazem,bentyl and oxycodone.I obtained an X ray after her office visit which suggested moderate stool burden.I commenced her on daily metamucil , linzess 145 mcg on 01/06/17 which she takes daily   Interval history   02/20/2017-  04/02/2017   Last visit she had some abdominal pain and we decided to obtain imaging and endoscopy.   CT abdomen 02/2017 - no acute changes  Labs ordered but not obtained.   EGD showed mild chronic gastritis with no H Pylori on biospy. Colonosocpy too was normal  Weight stable since last visit.   She is having a bowel movement daily . She is happy with the consistency . She still has some abdominal pain , in center of her abdomen , every time she tries to eat or walk . She does take her dexilant everyday .  Current Outpatient Prescriptions  Medication Sig Dispense Refill  . aspirin EC 81 MG tablet Take 81 mg by mouth daily.    . cetirizine (ZYRTEC) 10 MG tablet Take 10 mg by mouth daily.    Marland Kitchen DEXILANT 60 MG capsule     . dicyclomine (BENTYL) 10 MG capsule Take 1 capsule (10 mg total) by mouth 4 (four) times daily -  before meals and at bedtime. 90 capsule 0  . diltiazem (CARDIZEM CD) 120 MG 24 hr capsule Take 1 capsule (120 mg total) by mouth daily after breakfast. 90 capsule 3  . glipiZIDE (GLUCOTROL) 5 MG tablet     . linaclotide (LINZESS) 145 MCG CAPS capsule Take 1 capsule (145 mcg total) by mouth daily before breakfast. 30 capsule 2  . losartan (COZAAR) 25 MG tablet Take 25 mg by mouth daily.     . metoprolol succinate (TOPROL-XL) 50 MG 24 hr tablet Take 1 tablet (50 mg total) by mouth daily. Take with or  immediately following a meal. 90 tablet 1  . omeprazole (PRILOSEC) 40 MG capsule Take 1 capsule (40 mg total) by mouth daily. 90 capsule 3  . oxyCODONE (OXY IR/ROXICODONE) 5 MG immediate release tablet Limit 1 tablet by mouth 2-3 times per day if tolerated 90 tablet 0  . pregabalin (LYRICA) 25 MG capsule Limit  1 tablet by mouth per day or twice per day if tolerated 60 capsule 0  . Probiotic Product (VSL#3) CAPS Take 1 capsule by mouth 2 (two) times daily. 60 capsule 11  . simvastatin (ZOCOR) 10 MG tablet Take 10 mg by mouth every evening.     . Vitamin D, Ergocalciferol, (DRISDOL) 50000 units CAPS capsule Take 50,000 Units by mouth every 7 (seven) days.    Marland Kitchen dicyclomine (BENTYL) 20 MG tablet      Current Facility-Administered Medications  Medication Dose Route Frequency Provider Last Rate Last Dose  . ceFAZolin (ANCEF) IVPB 1 g/50 mL premix  1 g Intravenous Once Ewing Schlein, MD      . ceFAZolin (ANCEF) IVPB 1 g/50 mL premix  1 g Intravenous Once Ewing Schlein, MD      . ceFAZolin (ANCEF) IVPB 1 g/50 mL premix  1 g Intravenous Once Ewing Schlein, MD      . lactated  ringers infusion 1,000 mL  1,000 mL Intravenous Continuous Ewing Schlein, MD      . lactated ringers infusion 1,000 mL  1,000 mL Intravenous Continuous Ewing Schlein, MD 125 mL/hr at 08/14/16 0819 1,000 mL at 08/14/16 0819  . orphenadrine (NORFLEX) injection 60 mg  60 mg Intramuscular Once Ewing Schlein, MD      . orphenadrine (NORFLEX) injection 60 mg  60 mg Intramuscular Once Ewing Schlein, MD        Allergies as of 04/02/2017 - Review Complete 04/02/2017  Allergen Reaction Noted  . Lisinopril Swelling and Other (See Comments) 07/13/2015  . Effexor [venlafaxine] Hives 07/14/2013  . Ivp dye [iodinated diagnostic agents] Hives 07/14/2013  . Sulfa antibiotics Hives 07/14/2013    ROS:  General: Negative for anorexia, weight loss, fever, chills, fatigue, weakness. ENT: Negative for hoarseness, difficulty swallowing , nasal  congestion. CV: Negative for chest pain, angina, palpitations, dyspnea on exertion, peripheral edema.  Respiratory: Negative for dyspnea at rest, dyspnea on exertion, cough, sputum, wheezing.  GI: See history of present illness. GU:  Negative for dysuria, hematuria, urinary incontinence, urinary frequency, nocturnal urination.  Endo: Negative for unusual weight change.    Physical Examination:   BP (!) 142/65   Pulse 61   Temp 98.5 F (36.9 C) (Oral)   Ht  (1.651 m)   Wt 155 lb 3.2 oz (70.4 kg)   BMI 25.83 kg/m   General: Well-nourished, well-developed in no acute distress.  Eyes: No icterus. Conjunctivae pink. Mouth: Oropharyngeal mucosa moist and pink , no lesions erythema or exudate. Lungs: Clear to auscultation bilaterally. Non-labored. Heart: Regular rate and rhythm, no murmurs rubs or gallops.  Abdomen: Bowel sounds are normal, nontender, nondistended, no hepatosplenomegaly or masses, no abdominal bruits or hernia , no rebound or guarding.   Extremities: No lower extremity edema. No clubbing or deformities. Neuro: Alert and oriented x 3.  Grossly intact. Skin: Warm and dry, no jaundice.   Psych: Alert and cooperative, normal mood and affect.   Imaging Studies: Ct Abdomen Pelvis Wo Contrast  Result Date: 03/11/2017 CLINICAL DATA:  Intermittent periumbilical pain for about 1 year, 20 pounds of weight loss in the last 4 months EXAM: CT ABDOMEN AND PELVIS WITHOUT CONTRAST TECHNIQUE: Multidetector CT imaging of the abdomen and pelvis was performed following the standard protocol without IV contrast. COMPARISON:  CT scan 05/25/2016 FINDINGS: Lower chest: Lung bases shows no acute findings. Hepatobiliary: Status post cholecystectomy. Unenhanced liver shows no biliary ductal dilatation. Pancreas: Unenhanced pancreas without focal abnormality. Spleen: Unenhanced spleen without focal abnormality. Adrenals/Urinary Tract: No adrenal gland mass. Unenhanced kidneys shows no  nephrolithiasis. No hydronephrosis or hydroureter. No calcified ureteral calculi. No calcified calculi are noted within urinary bladder. Limited assessment of the urinary bladder due to extensive metallic artifacts from left hip prosthesis. Stomach/Bowel: Oral contrast material was given to the patient. No gastric outlet obstruction. No small bowel obstruction. No thickened or dilated small bowel loops. There is no pericecal inflammation. The patient is status post appendectomy. The terminal ileum is unremarkable. No evidence of colitis or diverticulitis. No distal colonic obstruction. Mild redundant distal sigmoid colon. Some colonic stool noted within distal sigmoid colon and rectum. Vascular/Lymphatic: No aortic aneurysm. Mild atherosclerotic calcifications bilateral renal artery origin. No retroperitoneal or mesenteric adenopathy. Reproductive: The patient is status post hysterectomy. Other: No ascites or free abdominal air. Small nonspecific bilateral inguinal lymph nodes are noted. Musculoskeletal: No destructive bony lesions are noted. There is posterior fusion lumbar spine at L3-L4  L5 level. Anatomic alignment. Degenerative changes with multilevel disc space flattening lower thoracic spine lumbar spine. Disc space flattening with vacuum disc phenomenon at L5-S1 level. Degenerative changes are noted pubic symphysis. IMPRESSION: 1. No nephrolithiasis.  No hydronephrosis or hydroureter. 2. No small bowel or colonic obstruction. No pericecal inflammation. Status post appendectomy. 3. Status post cholecystectomy. No intrahepatic biliary ductal dilatation. 4. Status post hysterectomy. No pelvic mass or adenopathy. Suboptimal examination of the pelvis due to extensive metallic artifacts from left hip prosthesis. 5. Posterior fusion lumbar spine L3-L4 L5 level with alignment preserved. Multilevel degenerative changes thoracolumbar spine. Degenerative changes pubic symphysis. Electronically Signed   By: Natasha Mead  M.D.   On: 03/11/2017 12:22    Assessment and Plan:   ROBERT SUNGA is a 76 y.o. y/o female here to follow up She has a history of irritable bowel syndrome with constipaton being predominant. Doing well on linzess and metamucil having bowel movements daily.some occasional epigastric pain for which I will try peppermint oil and carafate to use PRN  .Provided samples of peppermint oil.  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 Suggest to obtain lab work ordered last time.    .  Dr Wyline Mood  MD Follow up as needed.

## 2017-04-24 ENCOUNTER — Other Ambulatory Visit: Payer: Self-pay

## 2017-04-25 ENCOUNTER — Telehealth: Payer: Self-pay | Admitting: Gastroenterology

## 2017-04-25 NOTE — Telephone Encounter (Signed)
Patient LVM for her procedure results.

## 2017-04-29 ENCOUNTER — Telehealth: Payer: Self-pay | Admitting: Gastroenterology

## 2017-04-29 NOTE — Telephone Encounter (Signed)
Patient called and LVM. She is still having abd pain and not sure what to do. Please call patient.

## 2017-04-30 ENCOUNTER — Telehealth: Payer: Self-pay | Admitting: Cardiovascular Disease

## 2017-04-30 NOTE — Telephone Encounter (Signed)
Pt states she went her PCP this past Monday and HR was in the 40s. She states she feels like she wants to go to sleep, and doesn't have an appetite. Pt is schedule for 5/21

## 2017-04-30 NOTE — Telephone Encounter (Signed)
Pt stated that everytime she eats a light meal. The hurting is located middle of belly near belly button stated its just a dull pain that makes her want to scream. She is experiencing mild pain at this time. She stated that as long as she sitting still it doesn't bother her.  Normal bowel movements. Still taking metamucil.

## 2017-04-30 NOTE — Telephone Encounter (Addendum)
Spoke w/ pt.  She reports that her HR was low at PCP's office and she was advised to call and make appt w/ Dr. Mariah [facility]ollan. [facility]/ [facility] during call and reports BP 112/78, HR 45. HR has consistently been in the 40s and pt feels very tired- she just wants to sleep all day. Pt has already taken am meds for today.  Advised her not to take metoprolol or diltiazem until I call her back w/ Dr. Windell HummingbirdGollan's recommendation. She is appreciative and will await a call back.

## 2017-05-01 MED ORDER — METOPROLOL SUCCINATE ER 25 MG PO TB24
25.0000 mg | ORAL_TABLET | Freq: Every day | ORAL | 6 refills | Status: DC
Start: 2017-05-01 — End: 2017-05-16

## 2017-05-01 NOTE — Telephone Encounter (Signed)
Ensure she is taking IB guard , bentyl and she can follow up with me in 4-6 weeks

## 2017-05-01 NOTE — Telephone Encounter (Signed)
Spoke with patient and instructed her that I sent in prescription reflecting the decrease in her metoprolol. So reviewed with her that she would be taking metoprolol 25 mg once daily and to stop the diltiazem. She was appreciative for the call back with clarification with no further questions.

## 2017-05-01 NOTE — Telephone Encounter (Signed)
Routed information on medication changes to patients PCP.

## 2017-05-01 NOTE — Telephone Encounter (Signed)
Spoke with patient and reviewed Dr. Windell HummingbirdGollan's recommendations to stop taking the diltiazem and to continue at least 1/2 tablet of the metoprolol once daily. Instructed her to monitor her blood pressure, heart rate, and to call with those readings next week. She verbalized understanding with no further questions at this time.

## 2017-05-01 NOTE — Telephone Encounter (Signed)
Stop the diltiazem Continue the metoprolol at least 25 mg daily Monitor blood pressure at home and heart rate and call us with numbers

## 2017-05-02 ENCOUNTER — Telehealth: Payer: Self-pay

## 2017-05-02 ENCOUNTER — Other Ambulatory Visit: Payer: Self-pay

## 2017-05-02 DIAGNOSIS — K5909 Other constipation: Secondary | ICD-10-CM

## 2017-05-02 MED ORDER — LINACLOTIDE 145 MCG PO CAPS: 145.0000 ug | ORAL_CAPSULE | Freq: Every day | ORAL | 2 refills | Status: DC

## 2017-05-02 NOTE — Telephone Encounter (Signed)
Advised patient to stop omeprazole and continue Dexilant. Names written on the back of the bubble pack.  Yvette MoodAnna, Kiran, MD  [name], [name], CMA Cc: Yvette Collier, Yvette P, MD        Thank you Dr Lawerance BachBurns for the update.   Yvette Collier, can you please call the patient and inform her that she should stop omperazole and stay on Dexilant   Yvette Collier   C/c Dr Lawerance BachBurns   Previous Messages    ----- Message -----  From: Yvette Collier, Yvette P, MD  Sent: 04/30/2017  9:31 PM  To: Yvette MoodKiran Anna, MD  Subject: two ppi's                     Dear Dr. Sharlet SalinaKiran,   I recently saw patient and reviewed meds - she  had her bubble pack with her. I noticed both  omeprazole and dexilant were in pack. Do you  want her taking both meds?   thanks for your help,  Yvette Burns = [facility]

## 2017-05-05 NOTE — Telephone Encounter (Signed)
Returned call to patient. She says she was up and vacuuming this morning with no problems. Denies chest pain, SOB, palpitations, dizziness or swelling. She said the pharmacy fixed her pill packs and she verbalized understanding that she was to stop the diltiazem and take metoprolol 25 mg by mouth daily. She will continue to monitor her BP and heart rate and call us with an changes or concerns.

## 2017-05-05 NOTE — Telephone Encounter (Signed)
Spoke w/ pt.  She reports that she had "another episode" and now her HR is in the 50s.  CSW is there w/ her now and and reports that this am, her HR was 60, but now it is 50 w/ BP of 100/60. Advised pt to get up and move around to get her HR up. She has not been seen since Jan and has not had appt since her last hospital admission.  Sched her to come in to see Dr. Mariah MillingGollan tomorrow @ 11:40 - [place] [facility] [facility], so she requested later in the day.  She is sched for 4:20 but will call back if she can come in the am.

## 2017-05-05 NOTE — Telephone Encounter (Signed)
Pt calling today stating she was advised to call back to give us an update  Today her BP Is  100/60 HR 60  She states that's the best she's had In a while, but it just wont stay at that  Please advise

## 2017-05-06 ENCOUNTER — Ambulatory Visit (INDEPENDENT_AMBULATORY_CARE_PROVIDER_SITE_OTHER): Payer: Medicare Other | Admitting: Cardiovascular Disease

## 2017-05-06 ENCOUNTER — Encounter: Payer: Self-pay | Admitting: Cardiovascular Disease

## 2017-05-06 VITALS — BP 148/78 | HR 49 | Ht 65.0 in | Wt 150.5 lb

## 2017-05-06 DIAGNOSIS — I119 Hypertensive heart disease without heart failure: Secondary | ICD-10-CM

## 2017-05-06 DIAGNOSIS — I2510 Atherosclerotic heart disease of native coronary artery without angina pectoris: Secondary | ICD-10-CM

## 2017-05-06 DIAGNOSIS — I480 Paroxysmal atrial fibrillation: Secondary | ICD-10-CM

## 2017-05-06 DIAGNOSIS — I10 Essential (primary) hypertension: Secondary | ICD-10-CM

## 2017-05-06 DIAGNOSIS — E785 Hyperlipidemia, unspecified: Secondary | ICD-10-CM

## 2017-05-06 NOTE — Patient Instructions (Addendum)
Medication Instructions:   Please Stop the omeprazole Please cut the metoprolol down to 25 mg daily  Labwork:  No new labs needed  Testing/Procedures:  No further testing at this time   I recommend watching educational videos on topics of interest to you at:       [email or web address]  Enter code: [email or web address]    Follow-Up: It was a pleasure seeing you in the office today. Please call us if you have new issues that need to be addressed before your next appt.  [phone]  Your physician wants you to follow-up in: 6 months.  You will receive a reminder letter in the mail two months in advance. If you don't receive a letter, please call our office to schedule the follow-up appointment.  If you need a refill on your cardiac medications before your next appointment, please call your pharmacy.

## 2017-05-06 NOTE — Progress Notes (Signed)
Cardiology Office Note  Date:  05/06/2017   ID:  Yvette Collier, DOB Aug 03, 1941, MRN [id number]  PCP:  Hyman Hopes, MD   Chief Complaint  Patient presents with  . other    C/o chest heaviness, nausea/vomiting , flunctuating HR and BP. Meds reviewed verbally with pt.    HPI:  76 y.o. female with a history of  Chronic stress/anxiety Palpitations triggered by GI symptoms, long history of constipation, cramping significant GI issues including ischemic colitis, IBS,  hospital admission 09/15/2014 for GI complications,  atrial fibrillation 09/22/14. catheterization at [facility] June 2010 showing minimal coronary artery disease,  pharmacologic nuclear stress test August 2017 showed no evidence of ischemia with normal EF  anemia,  back surgery 3 lasting 2004,  left hip replacement 2012 She presents for routine followup of her atrial fibrillation  Recent event monitor January 2018showing no significant atrial fibrillation  In follow-up she reports feeling no good Still having stomach problems Recent EGD and colo Periodic palpitations  We reviewed her blister pack medication with her today She is on both high-dose omeprazole and dexilant She reported that she was cutting her metoprolol in half but she is taking 50 mg daily Dose was previously decreased for bradycardia on office visit September 2017, down to 12.5 mg daily  Seen 08/2016 In clinic At that time had increased shortness of breath. worsening bradycardia with heart rate in the 40s. The dose of Toprol was decreased to 12.5 mg once daily. She is also on diltiazem extended release 120 mg once daily.  EKG on today's visit shows normal sinus rhythm with no significant ST or T-wave changes  Other past medical history hospital admission to [facility] from 9/24-10/3 for diarrhea, segmental colitis c/w ischemic colitis who developed a 2 short runs of a-fib on the evening of 10/1, then went back into NSR.   It was felt that her a-fib was  2/2 Suspect secondary to multiple issues: ABD pain, distress, IVF and cardiac stretch. Electrolytes were ok.   Previous CT abdomen showed  segmental colitis c/w ischemic colitis, though perhaps a small vessel disease and not an urgent surgical issue per GI.   Echo showed EF 60-65%, normal global left ventricular systolic function, impaired relaxation pattern of LV diastolic filling, normal right ventricular size and systolic function, normal RVSP.  PMH:   has a past medical history of Asthma; Chronic lower back pain; COPD (chronic obstructive pulmonary disease) (HCC); Diabetes mellitus without complication (HCC); Gastrointestinal bleed; GERD (gastroesophageal reflux disease); History of colon polyps; Hypertension; Hypertensive heart disease; IBS (irritable bowel syndrome); Ischemic colitis (HCC); Non-obstructive Coronary Artery Disease; Osteoarthritis; PAF (paroxysmal atrial fibrillation) (HCC); and Transient cerebral ischemia due to atrial fibrillation (HCC).  PSH:    Past Surgical History:  Procedure Laterality Date  . ABDOMINAL HYSTERECTOMY    . APPENDECTOMY    . BACK SURGERY     x 3, last 2004.  . CHOLECYSTECTOMY    . COLONOSCOPY WITH PROPOFOL N/A 03/20/2017   Procedure: COLONOSCOPY WITH PROPOFOL;  Surgeon: Wyline Mood, MD;  Location: [facility] ENDOSCOPY;  Service: Endoscopy;  Laterality: N/A;  . ESOPHAGOGASTRODUODENOSCOPY (EGD) WITH PROPOFOL N/A 03/20/2017   Procedure: ESOPHAGOGASTRODUODENOSCOPY (EGD) WITH PROPOFOL;  Surgeon: Wyline Mood, MD;  Location: [facility] ENDOSCOPY;  Service: Endoscopy;  Laterality: N/A;  . FOOT SURGERY Right   . HEMORRHOID BANDING    . left total hip arthroplasty  2012  . STOMACH SURGERY    . TONSILLECTOMY      Current Outpatient Prescriptions  Medication  Sig Dispense Refill  . aspirin EC 81 MG tablet Take 81 mg by mouth daily.    . cetirizine (ZYRTEC) 10 MG tablet Take 10 mg by mouth daily.    Marland Kitchen. DEXILANT 60 MG capsule Take 60 mg by mouth daily.     Marland Kitchen. dicyclomine  (BENTYL) 20 MG tablet Take 20 mg by mouth daily as needed for spasms.    Marland Kitchen. glipiZIDE (GLUCOTROL) 5 MG tablet     . linaclotide (LINZESS) 145 MCG CAPS capsule Take 1 capsule (145 mcg total) by mouth daily before breakfast. 30 capsule 2  . losartan (COZAAR) 25 MG tablet Take 25 mg by mouth daily.     . metoprolol succinate (TOPROL-XL) 25 MG 24 hr tablet Take 1 tablet (25 mg total) by mouth daily. Take with or immediately following a meal. 30 tablet 6  . omeprazole (PRILOSEC) 40 MG capsule Take 1 capsule (40 mg total) by mouth daily. 90 capsule 3  . oxyCODONE (OXY IR/ROXICODONE) 5 MG immediate release tablet Limit 1 tablet by mouth 2-3 times per day if tolerated 90 tablet 0  . pregabalin (LYRICA) 25 MG capsule Limit  1 tablet by mouth per day or twice per day if tolerated 60 capsule 0  . Probiotic Product (VSL#3) CAPS Take 1 capsule by mouth 2 (two) times daily. 60 capsule 11  . simvastatin (ZOCOR) 10 MG tablet Take 10 mg by mouth every evening.     . sucralfate (CARAFATE) 1 GM/10ML suspension Take 10 mLs (1 g total) by mouth 4 (four) times daily. 420 mL 1   Current Facility-Administered Medications  Medication Dose Route Frequency Provider Last Rate Last Dose  . ceFAZolin (ANCEF) IVPB 1 g/50 mL premix  1 g Intravenous Once Ewing Schleinrisp, Gregory, MD      . ceFAZolin (ANCEF) IVPB 1 g/50 mL premix  1 g Intravenous Once Ewing Schleinrisp, Gregory, MD      . ceFAZolin (ANCEF) IVPB 1 g/50 mL premix  1 g Intravenous Once Ewing Schleinrisp, Gregory, MD      . lactated ringers infusion 1,000 mL  1,000 mL Intravenous Continuous Ewing Schleinrisp, Gregory, MD      . lactated ringers infusion 1,000 mL  1,000 mL Intravenous Continuous Ewing Schleinrisp, Gregory, MD 125 mL/hr at 08/14/16 0819 1,000 mL at 08/14/16 0819  . orphenadrine (NORFLEX) injection 60 mg  60 mg Intramuscular Once Ewing Schleinrisp, Gregory, MD      . orphenadrine (NORFLEX) injection 60 mg  60 mg Intramuscular Once Ewing Schleinrisp, Gregory, MD         Allergies:   Lisinopril; Effexor [venlafaxine]; Ivp dye  [iodinated diagnostic agents]; and Sulfa antibiotics   Social History:  The patient  reports that she has never smoked. She has never used smokeless tobacco. She reports that she does not drink alcohol or use drugs.   Family History:   family history includes Heart attack in her mother; Heart disease in her father; Hypertension in her brother, father, maternal aunt, and mother.    Review of Systems: Review of Systems  Constitutional: Negative.   Respiratory: Negative.   Cardiovascular: Positive for palpitations.       Tachycardia  Gastrointestinal: Positive for abdominal pain.  Musculoskeletal: Negative.   Neurological: Negative.   Psychiatric/Behavioral: Negative.   All other systems reviewed and are negative.    PHYSICAL EXAM: VS:  BP (!) 148/78 (BP Location: Left Arm, Patient Position: Sitting, Cuff Size: Normal)   Pulse (!) 49   Ht 5\' 5"  (1.651 m)   Wt 150 lb 8  oz (68.3 kg)   BMI 25.04 kg/m  , BMI Body mass index is 25.04 kg/m. GEN: Well nourished, well developed, in no acute distress  HEENT: normal  Neck: no JVD, carotid bruits, or masses Cardiac: RRR; no murmurs, rubs, or gallops,no edema  Respiratory:  clear to auscultation bilaterally, normal work of breathing GI: soft, nontender, nondistended, + BS MS: no deformity or atrophy  Skin: warm and dry, no rash Neuro:  Strength and sensation are intact Psych: euthymic mood, full affect    Recent Labs: 04/02/2017: ALT 13; BUN 21; Creatinine, Ser 1.01; Hemoglobin 11.2; Platelets 286; Potassium 4.2; Sodium 138; TSH 0.638    Lipid Panel No results found for: CHOL, HDL, LDLCALC, TRIG    Wt Readings from Last 3 Encounters:  05/06/17 150 lb 8 oz (68.3 kg)  04/02/17 155 lb 3.2 oz (70.4 kg)  03/20/17 150 lb (68 kg)       ASSESSMENT AND PLAN:  Coronary artery disease involving native coronary artery of native heart without angina pectoris - negative stress test within the past 6 months No further testing at this  time  Essential hypertension - Plan: EKG 12-Lead, LONG TERM MONITOR (3-14 DAYS) Blood pressure is well controlled on today's visit. No changes made to the medications.  PAF (paroxysmal atrial fibrillation) (HCC) -  Recent 30 day monitor showing no significant atrial fibrillation Given her bradycardia we will decrease metoprolol from 50 down to 15 mg daily  Palpitations Likely secondary to rare PVC seen on monitor  IBS Followed by GI Also with history of GERD. Currently taking 2 proton pump inhibitors Recommended she stop the omeprazole stay on dexilant She may want to confirm this with her GI team  Total encounter time more than 25 minutes Greater than 50% was spent in counseling and coordination of care with the patient   Disposition:   F/U  12 months   No orders of the defined types were placed in this encounter.    Signed, Dossie Arbour, M.D., Ph.D. 05/06/2017  [facility] [place], [place] [phone]

## 2017-05-12 ENCOUNTER — Ambulatory Visit: Payer: Medicare Other | Admitting: Cardiovascular Disease

## 2017-05-15 ENCOUNTER — Telehealth: Payer: Self-pay | Admitting: Cardiovascular Disease

## 2017-05-15 NOTE — Telephone Encounter (Signed)
She states that she was visiting patient today to do vitals and patients heart rate is still low at 49. She did report that patient is asymptomatic at this time with no complaints. Let her know that I would make Dr. Mariah MillingGollan aware and if he had any further recommendations we would be in touch. She was appreciative for the call back with no further questions at this time.

## 2017-05-15 NOTE — Telephone Encounter (Signed)
Yvette Collier from  [employer] assist calling to report her HR is 49  Its been a week with that change in medication to help it raise the HR but it doesn't seem to be working   Please advise

## 2017-05-16 NOTE — Telephone Encounter (Signed)
Spoke with Joy at patients pharmacy. Reviewed that Dr. Mariah MillingGollan would like her to discontinue the metoprolol and she verbalized understanding. Requested that she please call the patient to review because she was very hard of hearing and when I reviewed with her she was asking what color the pill was. Let her know that there are several colors that it could be and therefore the patient requested that I call the pharmacy. Joy states that she will call the patient and review this information with her.

## 2017-05-16 NOTE — Telephone Encounter (Signed)
Caregiver is off at this time and requested that I call the patient.

## 2017-05-16 NOTE — Telephone Encounter (Signed)
No answer/Voicemail box not set up.  

## 2017-05-16 NOTE — Telephone Encounter (Signed)
Okay to stop the metoprolol altogether

## 2017-05-16 NOTE — Telephone Encounter (Signed)
Spoke with patient and let her know that Dr. Mariah MillingGollan wants her to stop taking the metoprolol. She verbalized understanding but she has difficulty hearing. She requested that I call her pharmacy and let her know that I would give them a call now.

## 2017-06-06 ENCOUNTER — Telehealth: Payer: Self-pay | Admitting: Gastroenterology

## 2017-06-06 ENCOUNTER — Telehealth: Payer: Self-pay | Admitting: Cardiovascular Disease

## 2017-06-06 NOTE — Telephone Encounter (Signed)
Pt states last night around 6 her heart has "skipped", she felt as if she couldn't breathe, states she felt pressure, and was very tired. States she is having that feeling now, but not as bad.

## 2017-06-06 NOTE — Telephone Encounter (Signed)
Patient left a voice message that she is still in pain after her procedure. Please call

## 2017-06-06 NOTE — Telephone Encounter (Signed)
Received incoming call from patient. Patient had episode last night where she had fluttering in her heart and like she could not catch her breath. Her chest felt a little tight as well. Lasted about 2-3 hours, she took an oxycodone pain pill and then slept through the night. She felt fine upon waking this morning, then the symptoms started up again as she was moving around quickly cleaning the toilet and doing other house chores. She has sat down to rest and she feels back to normal again. Denies dizziness or chest pain.  Home health nurse took BP/HR this morning and wrote it down but she cannot find the paper. She says the top number was 137/ but does not remember the rest.  Metoprolol was discontinued on 05/16/17 for heart rate of 49 recorded by home health. Patient's BP cuff not working at this time. Advised patient it would be best for her to purchase a new BP cuff and continue to monitor BP and heartrate daily for us and stay hydrated. She verbalized understanding to advice and that I would make Dr Mariah MillingGollan aware and let her know any further recommendations.

## 2017-06-09 ENCOUNTER — Telehealth: Payer: Self-pay | Admitting: Gastroenterology

## 2017-06-09 MED ORDER — METOPROLOL TARTRATE 25 MG PO TABS: 25.0000 mg | ORAL_TABLET | Freq: Two times a day (BID) | ORAL | 3 refills | Status: DC | PRN

## 2017-06-09 NOTE — Telephone Encounter (Signed)
She may have had heart arrhythmia Options include we obtain a 14 day monitor She can take her metoprolol as needed for these tachycardia spells If she continues to have episodes, she may want to restart her metoprolol as she was previously taking

## 2017-06-09 NOTE — Telephone Encounter (Signed)
Patient called and is having a lot of stomach pains. She states she isn't constipated, but after she eats she has severe stomach spasms. Pain scale 0-10 its at a 7. Please call patient.

## 2017-06-09 NOTE — Telephone Encounter (Signed)
Spoke w/ pt.  Advised her of Dr. Windell HummingbirdGollan's recommendation.  She states that she would like to resume metoprolol tartrate 25 mg prn. She has enough for a few more days, sent refill in to her pharmacy. Asked her to call back if her sx do not improve. She is appreciative of the call.

## 2017-06-10 NOTE — Telephone Encounter (Signed)
Dr. Tobi BastosAnna called pt and she stated that she's unable to eat due to the stomach spasms.  Spasms are effecting her ability to walk. She has been advised to increase her fluid intake because her cramping could be coming from dehydration.  She had stopped the Bentyl but resumed it yesterday.

## 2017-06-11 NOTE — Telephone Encounter (Signed)
Since she restarted bentyl check on her on Monday to see if better

## 2017-06-12 ENCOUNTER — Other Ambulatory Visit: Payer: Self-pay | Admitting: Gastroenterology

## 2017-06-17 ENCOUNTER — Telehealth: Payer: Self-pay

## 2017-06-17 NOTE — Telephone Encounter (Signed)
Contacted pt this morning to see how she was feeling.  She stated that she was still having muscle cramping when she gets up to move around the house. She stated that she does take the Bentyl still but its not helping.

## 2017-06-17 NOTE — Telephone Encounter (Signed)
Called to check on pt.  She stated that she still has cramping discomfort when she moves around.  She is taking the Bentyl but said its not helping much.  I sent a telephone message to Dr. Tobi BastosAnna to let him know.

## 2017-06-19 ENCOUNTER — Telehealth: Payer: Self-pay | Admitting: Cardiovascular Disease

## 2017-06-19 NOTE — Telephone Encounter (Signed)
Ferdinand LangoKaren adkins calling from  home health Stating the Vital signs okay But HR was 44  Normal is around 45-100  Is calling to get some advise  They are to call if it not in range   Please advise

## 2017-06-19 NOTE — Telephone Encounter (Signed)
Left voicemail message to call back  

## 2017-06-19 NOTE — Telephone Encounter (Signed)
No answer. Left message to call back.   

## 2017-06-19 NOTE — Telephone Encounter (Signed)
Spoke with home health nurse and she reports that patients heart rate was 44 and their parameters to call was 45-100. Reviewed with her that over the last year her lowest heart rate was 41 with a range of normally being in the mid 40's. She stated that the patient was experiencing some pain and anxiety while she was there and that her blood pressure was 140/70's. She states that she had a choking spell prior to her arrival and had notified GI about this. She also stated that the patient had a strange feeling yesterday which she thought was a stroke. Reviewed with her that the metoprolol she is ordered as needed and if she had taken dose of that medication that could certainly be the cause of her slower heart rates and should avoid unless absolutely necessary. Reviewed with home health nurse to continue monitoring and call us back if it persists in the low ranges or if she was having any symptoms with that. She was appreciative for the call back and had no further questions at this time.

## 2017-06-21 ENCOUNTER — Emergency Department: Payer: Medicare Other

## 2017-06-21 ENCOUNTER — Emergency Department
Admission: EM | Admit: 2017-06-21 | Discharge: 2017-06-22 | Disposition: A | Discharge status: Home / Self Care | Payer: Medicare Other | Attending: Student in an Organized Health Care Education/Training Program | Admitting: Student in an Organized Health Care Education/Training Program

## 2017-06-21 ENCOUNTER — Encounter: Payer: Self-pay | Admitting: Emergency Medicine

## 2017-06-21 DIAGNOSIS — I25811 Atherosclerosis of native coronary artery of transplanted heart without angina pectoris: Secondary | ICD-10-CM | POA: Diagnosis not present

## 2017-06-21 DIAGNOSIS — I119 Hypertensive heart disease without heart failure: Secondary | ICD-10-CM | POA: Diagnosis not present

## 2017-06-21 DIAGNOSIS — Z79899 Other long term (current) drug therapy: Secondary | ICD-10-CM | POA: Diagnosis not present

## 2017-06-21 DIAGNOSIS — H538 Other visual disturbances: Secondary | ICD-10-CM | POA: Diagnosis not present

## 2017-06-21 DIAGNOSIS — R0789 Other chest pain: Secondary | ICD-10-CM

## 2017-06-21 DIAGNOSIS — M542 Cervicalgia: Secondary | ICD-10-CM | POA: Diagnosis not present

## 2017-06-21 DIAGNOSIS — J45909 Unspecified asthma, uncomplicated: Secondary | ICD-10-CM | POA: Insufficient documentation

## 2017-06-21 DIAGNOSIS — J449 Chronic obstructive pulmonary disease, unspecified: Secondary | ICD-10-CM | POA: Insufficient documentation

## 2017-06-21 DIAGNOSIS — Z7984 Long term (current) use of oral hypoglycemic drugs: Secondary | ICD-10-CM | POA: Diagnosis not present

## 2017-06-21 DIAGNOSIS — E1140 Type 2 diabetes mellitus with diabetic neuropathy, unspecified: Secondary | ICD-10-CM | POA: Insufficient documentation

## 2017-06-21 DIAGNOSIS — Z7982 Long term (current) use of aspirin: Secondary | ICD-10-CM | POA: Diagnosis not present

## 2017-06-21 LAB — BASIC METABOLIC PANEL
Anion gap: 6 (ref 5–15)
BUN: 16 mg/dL (ref 6–20)
CALCIUM: 9.7 mg/dL (ref 8.9–10.3)
CO2: 30 mmol/L (ref 22–32)
CREATININE: 0.93 mg/dL (ref 0.44–1.00)
Chloride: 102 mmol/L (ref 101–111)
GFR calc Af Amer: >60 mL/min (ref >60)
GFR calc non Af Amer: 58 mL/min — ABNORMAL LOW (ref >60)
GLUCOSE: 102 mg/dL — AB (ref 65–99)
Potassium: 4.2 mmol/L (ref 3.5–5.1)
Sodium: 138 mmol/L (ref 135–145)

## 2017-06-21 LAB — CBC
HCT: 33.9 % — ABNORMAL LOW (ref 35.0–47.0)
HEMOGLOBIN: 11.5 g/dL — AB (ref 12.0–16.0)
MCH: 30.8 pg (ref 26.0–34.0)
MCHC: 34 g/dL (ref 32.0–36.0)
MCV: 90.7 fL (ref 80.0–100.0)
Platelets: 272 10*3/uL (ref 150–440)
RBC: 3.74 MIL/uL — ABNORMAL LOW (ref 3.80–5.20)
RDW: 13 % (ref 11.5–14.5)
WBC: 4.9 10*3/uL (ref 3.6–11.0)

## 2017-06-21 LAB — TROPONIN I

## 2017-06-21 MED ORDER — DIPHENHYDRAMINE HCL 25 MG PO CAPS
50.0000 mg | ORAL_CAPSULE | Freq: Once | ORAL | Status: AC
  Administered 2017-06-21: 50 mg via ORAL
  Filled 2017-06-21: qty 2

## 2017-06-21 MED ORDER — HYDROCORTISONE NA SUCCINATE PF 250 MG IJ SOLR
200.0000 mg | Freq: Once | INTRAMUSCULAR | Status: AC
  Administered 2017-06-21: 200 mg via INTRAVENOUS
  Filled 2017-06-21: qty 200

## 2017-06-21 MED ORDER — DIPHENHYDRAMINE HCL 50 MG/ML IJ SOLN
50.0000 mg | Freq: Once | INTRAMUSCULAR | Status: AC
  Filled 2017-06-21: qty 1

## 2017-06-21 NOTE — ED Triage Notes (Signed)
Pt states that she has been having neck and chest pain for the past 2 days. Pt states that her neck hurts on both sides and feel like someone is pulling down on her neck. Pt states that her chest pain comes and goes. Pt states that she is not short of breath but feels like there is something on her chest. Pt denies N/V. Pt states that she has been dizzy. Pt states that when her neck starts hurting it makes her vision get blurry.

## 2017-06-21 NOTE — ED Notes (Signed)
Helped pt. To bathroom.  Pt. Able to ambulate w/assist.

## 2017-06-21 NOTE — ED Notes (Signed)
Called pharmacy, pharmacy sending solu-cortef

## 2017-06-21 NOTE — ED Provider Notes (Signed)
[facility] Emergency Department Provider Note    First MD Initiated Contact with Patient 06/21/17 1857     (approximate)  I have reviewed the triage vital signs and the nursing notes.   HISTORY  Chief Complaint Chest Pain    HPI Yvette Collier is a 76 y.o. female presents to the ER with a chief complaint of neck and chest pain for the past 2 days. States the pain hurts on both sides but on the left side more than the right area since that she's feeling is generalized pressure and collapsing sensation in her neck and her chest. States that she does not have any chest pain at this time. States it is worse when she is up and walking about. Denies any nausea or vomiting. Denies any recent fevers.She states that when she is moving around she gets severe neck pain she will have brief episodes of blurry vision that resolved. No blurry vision now. No numbness or tingling now.   Past Medical History:  Diagnosis Date  . Asthma   . Chronic lower back pain   . COPD (chronic obstructive pulmonary disease) (HCC)   . Diabetes mellitus without complication (HCC)   . Gastrointestinal bleed   . GERD (gastroesophageal reflux disease)   . History of colon polyps   . Hypertension   . Hypertensive heart disease   . IBS (irritable bowel syndrome)   . Ischemic colitis (HCC)   . Non-obstructive Coronary Artery Disease    a. 05/2009 [facility]): minimal nonobs dzs.  . Osteoarthritis   . PAF (paroxysmal atrial fibrillation) (HCC)    a. 09/2014 during GI illness;  b. CHA2DS2VASc = 5 (not currently on OAC 2/2 h/o GIB/ischemic colitis); c. 09/2014 Echo: EF 60-65%, imparied relaxation, nl RV size/fxn.  . Transient cerebral ischemia due to atrial fibrillation (HCC)    Family History  Problem Relation Age of Onset  . Heart attack Mother   . Hypertension Mother   . Heart disease Father   . Hypertension Father   . Hypertension Brother   . Hypertension Maternal Aunt    Past  Surgical History:  Procedure Laterality Date  . ABDOMINAL HYSTERECTOMY    . APPENDECTOMY    . BACK SURGERY     x 3, last 2004.  . CHOLECYSTECTOMY    . COLONOSCOPY WITH PROPOFOL N/A 03/20/2017   Procedure: COLONOSCOPY WITH PROPOFOL;  Surgeon: Wyline MoodKiran Anna, MD;  [facility] END[facility];  Service: Endoscopy;  Laterality: N/A;  . ESOPHAGOGASTRODUODENOSCOPY (EGD) WITH PROPOFOL N/A 03/20/2017   Procedure: ESOPHAGOGASTRODUODENOSCOPY (EGD) WITH PROPOFOL;  Surgeon: Wyline MoodKiran Anna, MD;  Location: [facility] ENDOSCOPY;  Service: Endoscopy;  Laterality: N/A;  . FOOT SURGERY Right   . HEMORRHOID BANDING    . left total hip arthroplasty  2012  . STOMACH SURGERY    . TONSILLECTOMY     Patient Active Problem List   Diagnosis Date Noted  . Constipation 01/06/2017  . DJD (degenerative joint disease) of knee 07/18/2016  . GIB (gastrointestinal bleeding) 05/25/2016  . GI bleed 05/24/2016  . Degenerative joint disease (DJD) of hip 05/08/2016  . 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 Hypertensive heart disease   . Hemorrhage of gastrointestinal tract 11/13/2015  . Lumbosacral facet joint syndrome (HCC) 07/26/2015  . Angioedema 07/13/2015  . DM2 (diabetes mellitus, type 2) (HCC) 07/13/2015  . Status post lumbar laminectomy 06/19/2015  . Lumbar radiculopathy 06/19/2015  . DDD (degenerative disc disease), lumbar 05/11/2015  . Facet syndrome, lumbar (HCC) 05/11/2015  .  Lumbar post-laminectomy syndrome 05/11/2015  . Sacroiliac joint disease 05/11/2015  . 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 Neuropathy due to secondary diabetes (HCC) 05/11/2015  . Essential hypertension 01/03/2015  . Coronary atherosclerosis of native coronary artery   . PAF (paroxysmal atrial fibrillation) (HCC) 10/20/2014  . Ischemic colitis (HCC) 10/20/2014  . Diabetes mellitus type 2 with complications (HCC) 10/20/2014  . COPD (chronic obstructive pulmonary disease) (HCC) 10/20/2014  . Hyperlipidemia  10/20/2014  . GERD (gastroesophageal reflux disease) 10/20/2014  . Ingrowing nail, right great toe 04/27/2014  . Internal hemorrhoid, bleeding 07/14/2013      Prior to Admission medications   Medication Sig Start Date End Date Taking? Authorizing Provider  aspirin EC 81 MG tablet Take 81 mg by mouth daily.    [provider]  cetirizine (ZYRTEC) 10 MG tablet Take 10 mg by mouth daily.    [provider]  DEXILANT 60 MG capsule TAKE ONE (1) CAPSULE EACH DAY FOR ACID REFLUX 06/13/17   Midge Minium, MD  dicyclomine (BENTYL) 20 MG tablet Take 20 mg by mouth daily as needed for spasms.    [provider]  glipiZIDE (GLUCOTROL) 5 MG tablet  03/25/17   [provider]  linaclotide Karlene Einstein) 145 MCG CAPS capsule Take 1 capsule (145 mcg total) by mouth daily before breakfast. 05/02/17   Wyline Mood, MD  losartan (COZAAR) 25 MG tablet Take 25 mg by mouth daily.     [provider]  metoprolol tartrate (LOPRESSOR) 25 MG tablet Take 1 tablet (25 mg total) by mouth 2 (two) times daily as needed (tachycardia). 06/09/17 09/07/17  Antonieta Iba, MD  omeprazole (PRILOSEC) 40 MG capsule Take 1 capsule (40 mg total) by mouth daily. 03/05/17   Wyline Mood, MD  oxyCODONE (OXY IR/ROXICODONE) 5 MG immediate release tablet Limit 1 tablet by mouth 2-3 times per day if tolerated 08/14/16   Ewing Schlein, MD  pregabalin (LYRICA) 25 MG capsule Limit  1 tablet by mouth per day or twice per day if tolerated 08/14/16   Ewing Schlein, MD  Probiotic Product (VSL#3) CAPS Take 1 capsule by mouth 2 (two) times daily. 06/13/16   Midge Minium, MD  simvastatin (ZOCOR) 10 MG tablet Take 10 mg by mouth every evening.     [provider]  sucralfate (CARAFATE) 1 GM/10ML suspension Take 10 mLs (1 g total) by mouth 4 (four) times daily. 04/02/17   Wyline Mood, MD    Allergies Lisinopril; Effexor [venlafaxine]; Ivp dye [iodinated diagnostic agents]; and Sulfa antibiotics    Social  History Social History  Substance Use Topics  . Smoking status: Never Smoker  . Smokeless tobacco: Never Used  . Alcohol use No    Review of Systems Patient denies headaches, rhinorrhea, blurry vision, numbness, shortness of breath, chest pain, edema, cough, abdominal pain, nausea, vomiting, diarrhea, dysuria, fevers, rashes or hallucinations unless otherwise stated above in HPI. ____________________________________________   PHYSICAL EXAM:  VITAL SIGNS: Vitals:   06/21/17 1619 06/21/17 1857  BP: (!) 179/77 (!) 176/94  Pulse: (!) 55 (!) 54  Resp: 16 13  Temp: 98.7 F (37.1 C)     Constitutional: Alert and oriented. Frail and cachectic appearing but pleasant and in no acute distress. Eyes: Conjunctivae are normal.  Head: Atraumatic. Nose: No congestion/rhinnorhea. Mouth/Throat: Mucous membranes are moist.   Neck: No stridor. Painless ROM. No bruits Cardiovascular: Normal rate, regular rhythm. Grossly normal heart sounds.  Good peripheral circulation. Respiratory: Normal respiratory effort.  No retractions. Lungs CTAB. Gastrointestinal: Soft and nontender. No distention. No abdominal bruits. No CVA tenderness. Musculoskeletal: No lower extremity tenderness nor edema.  No joint effusions. Neurologic:  Normal speech and language. No gross focal neurologic deficits are appreciated. No facial droop Skin:  Skin is warm, dry and intact. No rash noted. Psychiatric: Mood and affect are normal. Speech and behavior are normal.  ____________________________________________   LABS (all labs ordered are listed, but only abnormal results are displayed)  Results for orders placed or performed during the hospital encounter of 06/21/17 (from the past 24 hour(s))  Basic metabolic panel     Status: Abnormal   Collection Time: 06/21/17  4:25 PM  Result Value Ref Range   Sodium 138 135 - 145 mmol/L   Potassium 4.2 3.5 - 5.1 mmol/L   Chloride 102 101 - 111 mmol/L   CO2 30 22 - 32 mmol/L    Glucose, Bld 102 (H) 65 - 99 mg/dL   BUN 16 6 - 20 mg/dL   Creatinine, Ser 1.61 0.44 - 1.00 mg/dL   Calcium 9.7 8.9 - 09.6 mg/dL   GFR calc non Af Amer 58 (L) >60 mL/min   GFR calc Af Amer >60 >60 mL/min   Anion gap 6 5 - 15  CBC     Status: Abnormal   Collection Time: 06/21/17  4:25 PM  Result Value Ref Range   WBC 4.9 3.6 - 11.0 K/uL   RBC 3.74 (L) 3.80 - 5.20 MIL/uL   Hemoglobin 11.5 (L) 12.0 - 16.0 g/dL   HCT 04.5 (L) 40.9 - 81.1 %   MCV 90.7 80.0 - 100.0 fL   MCH 30.8 26.0 - 34.0 pg   MCHC 34.0 32.0 - 36.0 g/dL   RDW 91.4 78.2 - 95.6 %   Platelets 272 150 - 440 K/uL  Troponin I     Status: None   Collection Time: 06/21/17  4:25 PM  Result Value Ref Range   Troponin I <0.03 <0.03 ng/mL   ____________________________________________  EKG My review and personal interpretation at Time: 16:15   Indication: chest and neck pain  Rate: 50  Rhythm: sinus with 1st degree block, Axis: normal Other: no stemi, normal intervals ____________________________________________  RADIOLOGY  I personally reviewed all radiographic images ordered to evaluate for the above acute complaints and reviewed radiology reports and findings.  These findings were personally discussed with the patient.  Please see medical record for radiology report.  ____________________________________________   PROCEDURES  Procedure(s) performed:  Procedures    Critical Care performed: no ____________________________________________   INITIAL IMPRESSION / ASSESSMENT AND PLAN / ED COURSE  Pertinent labs & imaging results that were available during my care of the patient were reviewed by me and considered in my medical decision making (see chart for details).  DDX: cva, dehydration, conversion, dementia, ich, electrolyte abn, heat stroke,   Yvette Collier is a 76 y.o. who presents to the ED with symptoms as described above. Patient without any focal neuro deficits at this time but states that intermittently  will get blurry vision with her neck pain. Chest x-ray shows very calcified aorta and tortuous aorta. She has good perfusion throughout right now but given her presentation I am concern for processes such as dissection or aneurysm and do feel that she will need CT angiogram with contrast. Unfortunately the patient does have contrast allergy. Her EKG DOES NOT SHOW ANY EVIDENCE OF ACS AND THE PATIENT DENIES ANY CHEST PAIN CURRENTLY. HER TROPONIN IS  NEGATIVE.  As these episodes have been ongoing for the past 2 days I feel this is clinically unlikely to be ACS but will further risk stratify with repeat troponin.  Patient will be given steroids and Benadryl emergency preparation for CT with contrast. Patient will be signed out to Dr. Derrill Kay. Disposition pending results of repeat troponin, reassessment and CT angiogram.      ____________________________________________   FINAL CLINICAL IMPRESSION(S) / ED DIAGNOSES  Final diagnoses:  Neck pain  Atypical chest pain      NEW MEDICATIONS STARTED DURING THIS VISIT:  New Prescriptions   No medications on file     Note:  This document was prepared using Dragon voice recognition software and may include unintentional dictation errors.    Willy Eddy, MD 06/21/17 2010

## 2017-06-22 LAB — TROPONIN I

## 2017-06-22 MED ORDER — IOPAMIDOL (ISOVUE-370) INJECTION 76%
100.0000 mL | Freq: Once | INTRAVENOUS | Status: AC | PRN
  Administered 2017-06-22: 100 mL via INTRAVENOUS

## 2017-06-22 NOTE — Discharge Instructions (Signed)
Apply moist heat to affected area several times daily. Return to the ER for worsening symptoms, persistent vomiting, difficulty breathing or other concerns. 

## 2017-06-22 NOTE — ED Provider Notes (Signed)
-----------------------------------------   1:13 AM on 06/22/2017 -----------------------------------------  CT neck/chest interpreted per Dr. Harrie JeansStratton: 1. No acute vascular abnormality identified. No evidence for  dissection.  2. 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  3. Additional chronic findings as above.   Updated patient and daughter of CT imaging results. Patient was not aware of parotid enhancing mass. Will refer her to ENT for follow-up. Awaiting repeat troponin.  ----------------------------------------- 2:23 AM on 06/22/2017 -----------------------------------------  Updated patient and daughter of negative repeat troponin. Patient resting in no acute distress; voices no complaints. Given symptoms have been ongoing for the past 2 days, clinically low suspicion for ACS and now low suspicion for PE or dissection. Strict return precautions given. Both verbalize understanding and agree with plan of care.  ----------------------------------------- 6:46 AM on 06/22/2017 -----------------------------------------  Chart review: ENT contact information was not given. Will have nurse navigator give contact information for ENT on call Dr. Talmage NapBennett's so patient can follow-up for parotid enhancing mass.   Irean HongSung, Jade J, MD 06/22/17 [phone]

## 2017-06-22 NOTE — Telephone Encounter (Signed)
What dose bentyl she taking ? And frequency

## 2017-06-22 NOTE — ED Notes (Signed)
Pt. Going home with family. 

## 2017-06-23 ENCOUNTER — Ambulatory Visit (INDEPENDENT_AMBULATORY_CARE_PROVIDER_SITE_OTHER): Payer: Medicare Other | Admitting: Cardiovascular Disease

## 2017-06-23 ENCOUNTER — Encounter: Payer: Self-pay | Admitting: Cardiovascular Disease

## 2017-06-23 VITALS — BP 136/88 | HR 77 | Ht 65.0 in | Wt 147.2 lb

## 2017-06-23 DIAGNOSIS — I480 Paroxysmal atrial fibrillation: Secondary | ICD-10-CM | POA: Diagnosis not present

## 2017-06-23 DIAGNOSIS — R079 Chest pain, unspecified: Secondary | ICD-10-CM | POA: Diagnosis not present

## 2017-06-23 NOTE — Patient Instructions (Addendum)
Referral to ENT Ask Dr. Lawerance BachBurns  Ask Dr. Lawerance BachBurns about muscle relaxer pill and NSAIDs (meloxicam) for paravertebral muscle spasm   Medication Instructions:   No medication changes made  Labwork:  No new labs needed  Testing/Procedures:  No further testing at this time   Follow-Up: It was a pleasure seeing you in the office today. Please call us if you have new issues that need to be addressed before your next appt.  [phone]  Your physician wants you to follow-up in: 12 months.  You will receive a reminder letter in the mail two months in advance. If you don't receive a letter, please call our office to schedule the follow-up appointment.  If you need a refill on your cardiac medications before your next appointment, please call your pharmacy.

## 2017-06-23 NOTE — Progress Notes (Signed)
Cardiology Office Note  Date:  06/23/2017   ID:  Yvette Collier, DOB [birth year], MRN [id number]  PCP:  Hyman Hopes, MD   Chief Complaint  Patient presents with  . other    ED follow up. Patient c/o numbness in head and shoulders. Meds reviewed verbally with patient.     HPI:  76 y.o. female with a history of  Chronic stress/anxiety Palpitations triggered by GI symptoms, long history of constipation, cramping significant GI issues including ischemic colitis, IBS,  hospital admission 09/15/2014 for GI complications,  atrial fibrillation 09/22/14 catheterization at [facility] June 2010 showing minimal coronary artery disease,  pharmacologic nuclear stress test August 2017 showed no evidence of ischemia with normal EF  anemia,  back surgery 3 lasting 2004,  left hip replacement 2012 She presents for routine followup of her atrial fibrillation  She reports that she has severe neck pain In the ER yesterday,  Developed numbness on top of the head, pain left side of face, down to shoulder Ball in her chest , could not breath Vision blurry Blood pressure was elevated in the emergency room  Significant testing including head CT scan, neck CT, chest CT Small mass in her left parotid noted otherwise no significant findings Lab work essentially benign  On further discussion today she reports left-sided her neck is extremely tender, hurts to palpation, chronic issue  She continues to have stomach problems, recent EGD and colonoscopy Denies significant palpitations She is taking metoprolol as needed  Previous  event monitor January 2018 showing no significant atrial fibrillation  EKG personally reviewed by myself on todays visit Shows normal sinus rhythm with rate 76 bpm no significant ST or T-wave changes, rare PVC  Other past medical history reviewed  Seen 08/2016 In clinic At that time had increased shortness of breath. worsening bradycardia with heart rate in the 40s. The dose of  Toprol was decreased to 12.5 mg once daily. She is also on diltiazem extended release 120 mg once daily.  hospital admission to [facility] from 9/24-10/3 for diarrhea, segmental colitis c/w ischemic colitis who developed a 2 short runs of a-fib on the evening of 10/1, then went back into NSR.   It was felt that her a-fib was 2/2 Suspect secondary to multiple issues: ABD pain, distress, IVF and cardiac stretch. Electrolytes were ok.   Previous CT abdomen showed  segmental colitis c/w ischemic colitis, though perhaps a small vessel disease and not an urgent surgical issue per GI.   Echo showed EF 60-65%, normal global left ventricular systolic function, impaired relaxation pattern of LV diastolic filling, normal right ventricular size and systolic function, normal RVSP.  PMH:   has a past medical history of Asthma; Chronic lower back pain; COPD (chronic obstructive pulmonary disease) (HCC); Diabetes mellitus without complication (HCC); Gastrointestinal bleed; GERD (gastroesophageal reflux disease); History of colon polyps; Hypertension; Hypertensive heart disease; IBS (irritable bowel syndrome); Ischemic colitis (HCC); Non-obstructive Coronary Artery Disease; Osteoarthritis; PAF (paroxysmal atrial fibrillation) (HCC); and Transient cerebral ischemia due to atrial fibrillation (HCC).  PSH:    Past Surgical History:  Procedure Laterality Date  . ABDOMINAL HYSTERECTOMY    . APPENDECTOMY    . BACK SURGERY     x 3, last 2004.  . CHOLECYSTECTOMY    . COLONOSCOPY WITH PROPOFOL N/A 03/20/2017   Procedure: COLONOSCOPY WITH PROPOFOL;  Surgeon: Wyline Mood, MD;  Location[facility] ENDOSCOPY;  Service: Endoscopy;  Laterality: N/A;  . ESOPHAGOGASTRODUODENOSCOPY (EGD) WITH PROPOFOL N/A 03/20/2017   Procedure: ESOPHAGOGASTRODUODENOSCOPY (  EGD) WITH PROPOFOL;  Surgeon: Wyline Mood, MD;  Location: [facility] ENDOSCOPY;  Service: Endoscopy;  Laterality: N/A;  . FOOT SURGERY Right   . HEMORRHOID BANDING    . left total hip  arthroplasty  2012  . STOMACH SURGERY    . TONSILLECTOMY      Current Outpatient Prescriptions  Medication Sig Dispense Refill  . aspirin EC 81 MG tablet Take 81 mg by mouth daily.    . cetirizine (ZYRTEC) 10 MG tablet Take 10 mg by mouth daily.    Marland Kitchen DEXILANT 60 MG capsule TAKE ONE (1) CAPSULE EACH DAY FOR ACID REFLUX 30 capsule 3  . dicyclomine (BENTYL) 20 MG tablet Take 20 mg by mouth daily as needed for spasms.    Marland Kitchen glipiZIDE (GLUCOTROL) 5 MG tablet     . linaclotide (LINZESS) 145 MCG CAPS capsule Take 1 capsule (145 mcg total) by mouth daily before breakfast. 30 capsule 2  . losartan (COZAAR) 25 MG tablet Take 25 mg by mouth daily.     . metoprolol tartrate (LOPRESSOR) 25 MG tablet Take 1 tablet (25 mg total) by mouth 2 (two) times daily as needed (tachycardia). 60 tablet 3  . omeprazole (PRILOSEC) 40 MG capsule Take 1 capsule (40 mg total) by mouth daily. 90 capsule 3  . oxyCODONE (OXY IR/ROXICODONE) 5 MG immediate release tablet Limit 1 tablet by mouth 2-3 times per day if tolerated 90 tablet 0  . pregabalin (LYRICA) 25 MG capsule Limit  1 tablet by mouth per day or twice per day if tolerated 60 capsule 0  . Probiotic Product (VSL#3) CAPS Take 1 capsule by mouth 2 (two) times daily. 60 capsule 11  . simvastatin (ZOCOR) 10 MG tablet Take 10 mg by mouth every evening.     . sucralfate (CARAFATE) 1 GM/10ML suspension Take 10 mLs (1 g total) by mouth 4 (four) times daily. 420 mL 1   Current Facility-Administered Medications  Medication Dose Route Frequency Provider Last Rate Last Dose  . ceFAZolin (ANCEF) IVPB 1 g/50 mL premix  1 g Intravenous Once Ewing Schlein, MD      . ceFAZolin (ANCEF) IVPB 1 g/50 mL premix  1 g Intravenous Once Ewing Schlein, MD      . ceFAZolin (ANCEF) IVPB 1 g/50 mL premix  1 g Intravenous Once Ewing Schlein, MD      . lactated ringers infusion 1,000 mL  1,000 mL Intravenous Continuous Ewing Schlein, MD      . lactated ringers infusion 1,000 mL  1,000 mL  Intravenous Continuous Ewing Schlein, MD 125 mL/hr at 08/14/16 0819 1,000 mL at 08/14/16 0819  . orphenadrine (NORFLEX) injection 60 mg  60 mg Intramuscular Once Ewing Schlein, MD      . orphenadrine (NORFLEX) injection 60 mg  60 mg Intramuscular Once Ewing Schlein, MD         Allergies:   Lisinopril; Effexor [venlafaxine]; Ivp dye [iodinated diagnostic agents]; and Sulfa antibiotics   Social History:  The patient  reports that she has never smoked. She has never used smokeless tobacco. She reports that she does not drink alcohol or use drugs.   Family History:   family history includes Heart attack in her mother; Heart disease in her father; Hypertension in her brother, father, maternal aunt, and mother.    Review of Systems: Review of Systems  Constitutional: Negative.   Respiratory: Negative.   Cardiovascular: Positive for palpitations.       Tachycardia  Gastrointestinal: Negative.   Musculoskeletal: Positive  for neck pain.  Neurological: Negative.   Psychiatric/Behavioral: Negative.   All other systems reviewed and are negative.    PHYSICAL EXAM: VS:  BP 136/88 (BP Location: Left Arm, Patient Position: Sitting, Cuff Size: Normal)   Pulse 77   Ht 5\' 5"  (1.651 m)   Wt 147 lb 4 oz (66.8 kg)   BMI 24.50 kg/m  , BMI Body mass index is 24.5 kg/m. GEN: Well nourished, well developed, in no acute distress  HEENT: normal  Neck: no JVD, carotid bruits, or masses Cardiac: RRR; no murmurs, rubs, or gallops,no edema  Respiratory:  clear to auscultation bilaterally, normal work of breathing GI: soft, nontender, nondistended, + BS MS: no deformity or atrophy , Significant tenderness left neck on palpation  Skin: warm and dry, no rash Neuro:  Strength and sensation are intact Psych: euthymic mood, full affect    Recent Labs: 04/02/2017: ALT 13; TSH 0.638 06/21/2017: BUN 16; Creatinine, Ser 0.93; Hemoglobin 11.5; Platelets 272; Potassium 4.2; Sodium 138    Lipid Panel No  results found for: CHOL, HDL, LDLCALC, TRIG    Wt Readings from Last 3 Encounters:  06/23/17 147 lb 4 oz (66.8 kg)  05/06/17 150 lb 8 oz (68.3 kg)  04/02/17 155 lb 3.2 oz (70.4 kg)       ASSESSMENT AND PLAN:  Coronary artery disease involving native coronary artery of native heart without angina pectoris -  negative stress test , previous cardiac catheterization with nonobstructive disease  No further testing at this time  Essential hypertension -  Blood pressure is well controlled on today's visit. No changes made to the medications.  PAF (paroxysmal atrial fibrillation) (HCC) -  Previous monitor showing no significant atrial fibrillation  Recommended she stay on metoprolol at least 12.5 mg twice a day if not 25 mg twice a day She has only been taking this as needed  Palpitations PVC seen on EKG  IBS Followed by GI  history of GERD.  On proton pump inhibitor  Neck pain Paravertebral muscle spasms on the left Previously was on muscle relaxers She may also need NSAIDs Also might need cortisone shot  Total encounter time more than 25 minutes Greater than 50% was spent in counseling and coordination of care with the patient   Disposition:   F/U  12 months   No orders of the defined types were placed in this encounter.    Signed, Dossie Arbourim Gollan, M.D., Ph.D. 06/23/2017  [facility], [place] [phone]

## 2017-06-24 ENCOUNTER — Telehealth: Payer: Self-pay

## 2017-06-24 NOTE — Telephone Encounter (Signed)
Called to check in on Yvette Collier.  She told me she went to the ER because she thought she was having a stroke. She is no longer in the hospital.  Has an appt with Dr. Mariah MillingGollan this morning.  She said she will call the office to make a follow up appt with you.

## 2017-06-24 NOTE — Telephone Encounter (Signed)
Patient had an ER visit  (she thought she had a stroke) and was discharged.  She will contact office to make a follow up appt with us to discuss her stomach after she is finished at Dr. Windell HummingbirdGollan's office.

## 2017-06-30 ENCOUNTER — Telehealth: Payer: Self-pay | Admitting: Emergency Medicine

## 2017-06-30 NOTE — Telephone Encounter (Signed)
Called patient at request of dr sung to assure ent follow up.  Patient says she has appointment set up by dr burns office.

## 2017-08-04 ENCOUNTER — Telehealth: Payer: Self-pay | Admitting: Cardiovascular Disease

## 2017-08-04 NOTE — Telephone Encounter (Signed)
Received fax from [facility]:  "Pt states that she is taking metoprolol tartrate 25 mg, 1/2 tab BID.   We only have the 1 tab BID.  May we get a new rx for the correct instructions."  Pt's chart indicates she is to take 25 mg BID prn. Routing to Dr. Mariah MillingGollan for clarification.

## 2017-08-07 MED ORDER — METOPROLOL TARTRATE 25 MG PO TABS: 12.5000 mg | ORAL_TABLET | Freq: Two times a day (BID) | ORAL | 6 refills | Status: DC

## 2017-08-07 NOTE — Telephone Encounter (Signed)
Would recommend she take metoprolol 12.5 mill grams twice a day Okay to send in prescription If she has episodes of palpitations we would date increase up to 25 twice a day

## 2017-08-07 NOTE — Telephone Encounter (Signed)
Spoke with Whitney PostLogan at pharmacy and reviewed medication instructions and indications with him. Transferred to [facility] and she just wanted to see if we could provide the patient with clear instructions. Will send in corrected directions as approved by Dr. Mariah MillingGollan.

## 2017-08-26 ENCOUNTER — Other Ambulatory Visit: Payer: Self-pay | Admitting: Gastroenterology

## 2017-09-01 ENCOUNTER — Encounter: Payer: Self-pay | Admitting: Emergency Medicine

## 2017-09-01 ENCOUNTER — Emergency Department: Payer: Medicare Other

## 2017-09-01 ENCOUNTER — Inpatient Hospital Stay (HOSPITAL_COMMUNITY)
Admission: AD | Admit: 2017-09-01 | Discharge: 2017-09-04 | DRG: 064 | Disposition: A | Discharge status: Rehabilitation Facility | Payer: Medicare Other | Source: Other Acute Inpatient Hospital | Attending: Neurology | Admitting: Neurology

## 2017-09-01 ENCOUNTER — Emergency Department
Admission: EM | Admit: 2017-09-01 | Discharge: 2017-09-01 | Disposition: A | Discharge status: Other Acute Inpatient Hospital | Payer: Medicare Other | Attending: Emergency Medicine | Admitting: Emergency Medicine

## 2017-09-01 DIAGNOSIS — Z79899 Other long term (current) drug therapy: Secondary | ICD-10-CM | POA: Insufficient documentation

## 2017-09-01 DIAGNOSIS — I2510 Atherosclerotic heart disease of native coronary artery without angina pectoris: Secondary | ICD-10-CM | POA: Diagnosis present

## 2017-09-01 DIAGNOSIS — I119 Hypertensive heart disease without heart failure: Secondary | ICD-10-CM | POA: Diagnosis present

## 2017-09-01 DIAGNOSIS — I629 Nontraumatic intracranial hemorrhage, unspecified: Secondary | ICD-10-CM | POA: Diagnosis not present

## 2017-09-01 DIAGNOSIS — I69151 Hemiplegia and hemiparesis following nontraumatic intracerebral hemorrhage affecting right dominant side: Secondary | ICD-10-CM | POA: Diagnosis not present

## 2017-09-01 DIAGNOSIS — R29706 NIHSS score 6: Secondary | ICD-10-CM | POA: Diagnosis present

## 2017-09-01 DIAGNOSIS — R402233 Coma scale, best verbal response, inappropriate words, at hospital admission: Secondary | ICD-10-CM | POA: Diagnosis not present

## 2017-09-01 DIAGNOSIS — Z91041 Radiographic dye allergy status: Secondary | ICD-10-CM | POA: Diagnosis not present

## 2017-09-01 DIAGNOSIS — Z882 Allergy status to sulfonamides status: Secondary | ICD-10-CM | POA: Diagnosis not present

## 2017-09-01 DIAGNOSIS — G8929 Other chronic pain: Secondary | ICD-10-CM | POA: Diagnosis not present

## 2017-09-01 DIAGNOSIS — W19XXXA Unspecified fall, initial encounter: Secondary | ICD-10-CM | POA: Diagnosis present

## 2017-09-01 DIAGNOSIS — Z23 Encounter for immunization: Secondary | ICD-10-CM | POA: Diagnosis present

## 2017-09-01 DIAGNOSIS — Z8719 Personal history of other diseases of the digestive system: Secondary | ICD-10-CM | POA: Diagnosis not present

## 2017-09-01 DIAGNOSIS — I110 Hypertensive heart disease with heart failure: Secondary | ICD-10-CM | POA: Insufficient documentation

## 2017-09-01 DIAGNOSIS — Z8249 Family history of ischemic heart disease and other diseases of the circulatory system: Secondary | ICD-10-CM

## 2017-09-01 DIAGNOSIS — J449 Chronic obstructive pulmonary disease, unspecified: Secondary | ICD-10-CM | POA: Diagnosis present

## 2017-09-01 DIAGNOSIS — Z888 Allergy status to other drugs, medicaments and biological substances status: Secondary | ICD-10-CM

## 2017-09-01 DIAGNOSIS — G8191 Hemiplegia, unspecified affecting right dominant side: Secondary | ICD-10-CM | POA: Diagnosis present

## 2017-09-01 DIAGNOSIS — I615 Nontraumatic intracerebral hemorrhage, intraventricular: Secondary | ICD-10-CM | POA: Diagnosis present

## 2017-09-01 DIAGNOSIS — I509 Heart failure, unspecified: Secondary | ICD-10-CM | POA: Diagnosis not present

## 2017-09-01 DIAGNOSIS — Y92009 Unspecified place in unspecified non-institutional (private) residence as the place of occurrence of the external cause: Secondary | ICD-10-CM | POA: Diagnosis not present

## 2017-09-01 DIAGNOSIS — I253 Aneurysm of heart: Secondary | ICD-10-CM | POA: Diagnosis not present

## 2017-09-01 DIAGNOSIS — K219 Gastro-esophageal reflux disease without esophagitis: Secondary | ICD-10-CM | POA: Diagnosis present

## 2017-09-01 DIAGNOSIS — R402143 Coma scale, eyes open, spontaneous, at hospital admission: Secondary | ICD-10-CM | POA: Diagnosis not present

## 2017-09-01 DIAGNOSIS — Z90710 Acquired absence of both cervix and uterus: Secondary | ICD-10-CM | POA: Diagnosis not present

## 2017-09-01 DIAGNOSIS — I161 Hypertensive emergency: Secondary | ICD-10-CM | POA: Diagnosis present

## 2017-09-01 DIAGNOSIS — R4702 Dysphasia: Secondary | ICD-10-CM | POA: Diagnosis not present

## 2017-09-01 DIAGNOSIS — I69120 Aphasia following nontraumatic intracerebral hemorrhage: Secondary | ICD-10-CM | POA: Diagnosis not present

## 2017-09-01 DIAGNOSIS — I6200 Nontraumatic subdural hemorrhage, unspecified: Secondary | ICD-10-CM | POA: Diagnosis present

## 2017-09-01 DIAGNOSIS — I69398 Other sequelae of cerebral infarction: Secondary | ICD-10-CM | POA: Diagnosis not present

## 2017-09-01 DIAGNOSIS — R269 Unspecified abnormalities of gait and mobility: Secondary | ICD-10-CM | POA: Diagnosis not present

## 2017-09-01 DIAGNOSIS — J45909 Unspecified asthma, uncomplicated: Secondary | ICD-10-CM | POA: Insufficient documentation

## 2017-09-01 DIAGNOSIS — I10 Essential (primary) hypertension: Secondary | ICD-10-CM | POA: Diagnosis not present

## 2017-09-01 DIAGNOSIS — E119 Type 2 diabetes mellitus without complications: Secondary | ICD-10-CM | POA: Diagnosis present

## 2017-09-01 DIAGNOSIS — I480 Paroxysmal atrial fibrillation: Secondary | ICD-10-CM | POA: Diagnosis present

## 2017-09-01 DIAGNOSIS — E785 Hyperlipidemia, unspecified: Secondary | ICD-10-CM | POA: Diagnosis not present

## 2017-09-01 DIAGNOSIS — I169 Hypertensive crisis, unspecified: Secondary | ICD-10-CM | POA: Diagnosis not present

## 2017-09-01 DIAGNOSIS — R402363 Coma scale, best motor response, obeys commands, at hospital admission: Secondary | ICD-10-CM | POA: Diagnosis present

## 2017-09-01 DIAGNOSIS — E1159 Type 2 diabetes mellitus with other circulatory complications: Secondary | ICD-10-CM | POA: Diagnosis not present

## 2017-09-01 DIAGNOSIS — I611 Nontraumatic intracerebral hemorrhage in hemisphere, cortical: Principal | ICD-10-CM | POA: Diagnosis present

## 2017-09-01 DIAGNOSIS — R059 Cough, unspecified: Secondary | ICD-10-CM

## 2017-09-01 DIAGNOSIS — N179 Acute kidney failure, unspecified: Secondary | ICD-10-CM | POA: Diagnosis not present

## 2017-09-01 DIAGNOSIS — R4701 Aphasia: Secondary | ICD-10-CM | POA: Diagnosis present

## 2017-09-01 DIAGNOSIS — I5030 Unspecified diastolic (congestive) heart failure: Secondary | ICD-10-CM | POA: Diagnosis not present

## 2017-09-01 DIAGNOSIS — Z7982 Long term (current) use of aspirin: Secondary | ICD-10-CM | POA: Insufficient documentation

## 2017-09-01 DIAGNOSIS — R05 Cough: Secondary | ICD-10-CM

## 2017-09-01 DIAGNOSIS — K559 Vascular disorder of intestine, unspecified: Secondary | ICD-10-CM | POA: Diagnosis not present

## 2017-09-01 DIAGNOSIS — E1142 Type 2 diabetes mellitus with diabetic polyneuropathy: Secondary | ICD-10-CM | POA: Diagnosis not present

## 2017-09-01 DIAGNOSIS — G936 Cerebral edema: Secondary | ICD-10-CM | POA: Diagnosis present

## 2017-09-01 DIAGNOSIS — Z96642 Presence of left artificial hip joint: Secondary | ICD-10-CM | POA: Diagnosis present

## 2017-09-01 LAB — DIFFERENTIAL
Basophils Absolute: 0 10*3/uL (ref 0–0.1)
Basophils Relative: 1 %
EOS PCT: 0 %
Eosinophils Absolute: 0 10*3/uL (ref 0–0.7)
LYMPHS ABS: 0.6 10*3/uL — AB (ref 1.0–3.6)
LYMPHS PCT: 8 %
MONO ABS: 0.1 10*3/uL — AB (ref 0.2–0.9)
MONOS PCT: 2 %
NEUTROS ABS: 6.6 10*3/uL — AB (ref 1.4–6.5)
Neutrophils Relative %: 89 %

## 2017-09-01 LAB — URINE DRUG SCREEN, QUALITATIVE (ARMC ONLY)
Amphetamines, Ur Screen: NOT DETECTED
BARBITURATES, UR SCREEN: NOT DETECTED
BENZODIAZEPINE, UR SCRN: NOT DETECTED
CANNABINOID 50 NG, UR ~~LOC~~: NOT DETECTED
COCAINE METABOLITE, UR ~~LOC~~: NOT DETECTED
MDMA (Ecstasy)Ur Screen: NOT DETECTED
METHADONE SCREEN, URINE: NOT DETECTED
Opiate, Ur Screen: NOT DETECTED
Phencyclidine (PCP) Ur S: NOT DETECTED
TRICYCLIC, UR SCREEN: NOT DETECTED

## 2017-09-01 LAB — URINALYSIS, ROUTINE W REFLEX MICROSCOPIC
Bilirubin Urine: NEGATIVE
Glucose, UA: NEGATIVE mg/dL
Ketones, ur: 5 mg/dL — AB
NITRITE: NEGATIVE
PH: 6 (ref 5.0–8.0)
Protein, ur: NEGATIVE mg/dL
SPECIFIC GRAVITY, URINE: 1.01 (ref 1.005–1.030)

## 2017-09-01 LAB — COMPREHENSIVE METABOLIC PANEL
ALK PHOS: 105 U/L (ref 38–126)
ALT: 18 U/L (ref 14–54)
ANION GAP: 11 (ref 5–15)
AST: 26 U/L (ref 15–41)
Albumin: 4.4 g/dL (ref 3.5–5.0)
BILIRUBIN TOTAL: 0.5 mg/dL (ref 0.3–1.2)
BUN: 15 mg/dL (ref 6–20)
CALCIUM: 9.8 mg/dL (ref 8.9–10.3)
CO2: 26 mmol/L (ref 22–32)
CREATININE: 0.87 mg/dL (ref 0.44–1.00)
Chloride: 103 mmol/L (ref 101–111)
Glucose, Bld: 196 mg/dL — ABNORMAL HIGH (ref 65–99)
Potassium: 3.9 mmol/L (ref 3.5–5.1)
Sodium: 140 mmol/L (ref 135–145)
TOTAL PROTEIN: 7.8 g/dL (ref 6.5–8.1)

## 2017-09-01 LAB — APTT: aPTT: 32 seconds (ref 24–36)

## 2017-09-01 LAB — CBC
HEMATOCRIT: 33.9 % — AB (ref 35.0–47.0)
Hemoglobin: 11.4 g/dL — ABNORMAL LOW (ref 12.0–16.0)
MCH: 30.4 pg (ref 26.0–34.0)
MCHC: 33.7 g/dL (ref 32.0–36.0)
MCV: 90 fL (ref 80.0–100.0)
Platelets: 293 10*3/uL (ref 150–440)
RBC: 3.76 MIL/uL — AB (ref 3.80–5.20)
RDW: 13.4 % (ref 11.5–14.5)
WBC: 7.3 10*3/uL (ref 3.6–11.0)

## 2017-09-01 LAB — PROTIME-INR
INR: 1.05
PROTHROMBIN TIME: 13.6 s (ref 11.4–15.2)

## 2017-09-01 LAB — GLUCOSE, CAPILLARY
GLUCOSE-CAPILLARY: 172 mg/dL — AB (ref 65–99)
GLUCOSE-CAPILLARY: 196 mg/dL — AB (ref 65–99)

## 2017-09-01 LAB — TROPONIN I

## 2017-09-01 LAB — ETHANOL: Alcohol, Ethyl (B): <5 mg/dL (ref <5)

## 2017-09-01 MED ORDER — SENNOSIDES-DOCUSATE SODIUM 8.6-50 MG PO TABS
1.0000 | ORAL_TABLET | Freq: Two times a day (BID) | ORAL | Status: DC
  Administered 2017-09-02 – 2017-09-04 (×4): 1 via ORAL
  Filled 2017-09-01 (×5): qty 1

## 2017-09-01 MED ORDER — NICARDIPINE HCL IN NACL 20-0.86 MG/200ML-% IV SOLN
0.0000 mg/h | INTRAVENOUS | Status: DC
  Administered 2017-09-01: 5 mg/h via INTRAVENOUS
  Administered 2017-09-01: 2.5 mg/h via INTRAVENOUS
  Filled 2017-09-01 (×2): qty 200

## 2017-09-01 MED ORDER — NICARDIPINE HCL IN NACL 20-0.86 MG/200ML-% IV SOLN
3.0000 mg/h | INTRAVENOUS | Status: DC
  Administered 2017-09-01: 12.5 mg/h via INTRAVENOUS
  Administered 2017-09-02: 3 mg/h via INTRAVENOUS
  Administered 2017-09-02: 10 mg/h via INTRAVENOUS
  Administered 2017-09-02: 7.5 mg/h via INTRAVENOUS
  Filled 2017-09-01 (×4): qty 200

## 2017-09-01 MED ORDER — STROKE: EARLY STAGES OF RECOVERY BOOK
Freq: Once | Status: AC
  Administered 2017-09-01: 1
  Filled 2017-09-01: qty 1

## 2017-09-01 MED ORDER — PANTOPRAZOLE SODIUM 40 MG IV SOLR
40.0000 mg | Freq: Every day | INTRAVENOUS | Status: DC
  Administered 2017-09-01: 40 mg via INTRAVENOUS
  Filled 2017-09-01: qty 40

## 2017-09-01 MED ORDER — SODIUM CHLORIDE 0.9 % IV SOLN
INTRAVENOUS | Status: DC
  Administered 2017-09-01: 22:00:00 via INTRAVENOUS

## 2017-09-01 NOTE — ED Notes (Signed)
Yvette BushyDevona Collier- daughter- [phone] cell phone  Yvette ForsterClaudia Collier- daughter- [phone] cell phone

## 2017-09-01 NOTE — ED Provider Notes (Addendum)
[facility] Emergency Department Provider Note   ____________________________________________    I have reviewed the triage vital signs and the nursing notes.   HISTORY  Chief Complaint Aphasia   history of present illness Limited by altered mental status   HPI Yvette Collier is a 76 y.o. female Who presents with altered mental status. Reportedly patient last seen well at 11 AM yesterday, I.e. 24 hours ago. Daughter was unable to get in touch with her on the phone today so she went to her house and found her on the sofa, questionable fall. Patient is unable to provide history   Past Medical History:  Diagnosis Date  . Asthma   . Chronic lower back pain   . COPD (chronic obstructive pulmonary disease) (HCC)   . Diabetes mellitus without complication (HCC)   . Gastrointestinal bleed   . GERD (gastroesophageal reflux disease)   . History of colon polyps   . Hypertension   . Hypertensive heart disease   . IBS (irritable bowel syndrome)   . Ischemic colitis (HCC)   . Non-obstructive Coronary Artery Disease    a. 05/2009 [facility]): minimal nonobs dzs.  . Osteoarthritis   . PAF (paroxysmal atrial fibrillation) (HCC)    a. 09/2014 during GI illness;  b. CHA2DS2VASc = 5 (not currently on OAC 2/2 h/o GIB/ischemic colitis); c. 09/2014 Echo: EF 60-65%, imparied relaxation, nl RV size/fxn.  . Transient cerebral ischemia due to atrial fibrillation [facility])     Patient Active Problem List   Diagnosis Date Noted  . Constipation 01/06/2017  . DJD (degenerative joint disease) of knee 07/18/2016  . GIB (gastrointestinal bleeding) 05/25/2016  . GI bleed 05/24/2016  . Degenerative joint disease (DJD) of hip 05/08/2016  . 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 Hypertensive heart disease   . Hemorrhage of gastrointestinal tract 11/13/2015  . Lumbosacral facet joint syndrome (HCC) 07/26/2015  . Angioedema 07/13/2015  . DM2 (diabetes mellitus, type 2) (HCC)  07/13/2015  . Status post lumbar laminectomy 06/19/2015  . Lumbar radiculopathy 06/19/2015  . DDD (degenerative disc disease), lumbar 05/11/2015  . Facet syndrome, lumbar (HCC) 05/11/2015  . Lumbar post-laminectomy syndrome 05/11/2015  . Sacroiliac joint disease 05/11/2015  . 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 Neuropathy due to secondary diabetes (HCC) 05/11/2015  . Essential hypertension 01/03/2015  . Coronary atherosclerosis of native coronary artery   . PAF (paroxysmal atrial fibrillation) (HCC) 10/20/2014  . Ischemic colitis (HCC) 10/20/2014  . Diabetes mellitus type 2 with complications (HCC) 10/20/2014  . COPD (chronic obstructive pulmonary disease) (HCC) 10/20/2014  . Hyperlipidemia 10/20/2014  . GERD (gastroesophageal reflux disease) 10/20/2014  . Ingrowing nail, right great toe 04/27/2014  . Internal hemorrhoid, bleeding 07/14/2013    Past Surgical History:  Procedure Laterality Date  . ABDOMINAL HYSTERECTOMY    . APPENDECTOMY    . BACK SURGERY     x 3, last 2004.  . CHOLECYSTECTOMY    . COLONOSCOPY WITH PROPOFOL N/A 03/20/2017   Procedure: COLONOSCOPY WITH PROPOFOL;  Surgeon: Wyline Mood, MD;  Location: [facility] ENDOSCOPY;  Service: Endoscopy;  Laterality: N/A;  . ESOPHAGOGASTRODUODENOSCOPY (EGD) WITH PROPOFOL N/A 03/20/2017   Procedure: ESOPHAGOGASTRODUODENOSCOPY (EGD) WITH PROPOFOL;  Surgeon: Wyline Mood, MD;  Location: [facility] ENDOSCOPY;  Service: Endoscopy;  Laterality: N/A;  . FOOT SURGERY Right   . HEMORRHOID BANDING    . left total hip arthroplasty  2012  . STOMACH SURGERY    . TONSILLECTOMY  Prior to Admission medications   Medication Sig Start Date End Date Taking? Authorizing Provider  aspirin EC 81 MG tablet Take 81 mg by mouth daily.    [provider]  cetirizine (ZYRTEC) 10 MG tablet Take 10 mg by mouth daily.    [provider]  DEXILANT 60 MG capsule TAKE ONE (1) CAPSULE EACH DAY FOR ACID REFLUX  06/13/17   Midge Minium, MD  dicyclomine (BENTYL) 10 MG capsule TAKE 1 CAPSULE BY MOUTH 4 TIMES DAILY BEFORE MEALS AND AT BEDTIME 08/26/17   Wyline Mood, MD  dicyclomine (BENTYL) 20 MG tablet Take 20 mg by mouth daily as needed for spasms.    [provider]  glipiZIDE (GLUCOTROL) 5 MG tablet  03/25/17   [provider]  linaclotide Karlene Einstein) 145 MCG CAPS capsule Take 1 capsule (145 mcg total) by mouth daily before breakfast. 05/02/17   Wyline Mood, MD  losartan (COZAAR) 25 MG tablet Take 25 mg by mouth daily.     [provider]  metoprolol tartrate (LOPRESSOR) 25 MG tablet Take 0.5 tablets (12.5 mg total) by mouth 2 (two) times daily. 08/07/17 11/05/17  Antonieta Iba, MD  omeprazole (PRILOSEC) 40 MG capsule Take 1 capsule (40 mg total) by mouth daily. 03/05/17   Wyline Mood, MD  oxyCODONE (OXY IR/ROXICODONE) 5 MG immediate release tablet Limit 1 tablet by mouth 2-3 times per day if tolerated 08/14/16   Ewing Schlein, MD  pregabalin (LYRICA) 25 MG capsule Limit  1 tablet by mouth per day or twice per day if tolerated 08/14/16   Ewing Schlein, MD  Probiotic Product (VSL#3) CAPS Take 1 capsule by mouth 2 (two) times daily. 06/13/16   Midge Minium, MD  simvastatin (ZOCOR) 10 MG tablet Take 10 mg by mouth every evening.     [provider]  sucralfate (CARAFATE) 1 GM/10ML suspension Take 10 mLs (1 g total) by mouth 4 (four) times daily. 04/02/17   Wyline Mood, MD     Allergies Lisinopril; Effexor [venlafaxine]; Hctz [hydrochlorothiazide]; Ivp dye [iodinated diagnostic agents]; Prednisone; and Sulfa antibiotics  Family History  Problem Relation Age of Onset  . Heart attack Mother   . Hypertension Mother   . Heart disease Father   . Hypertension Father   . Hypertension Brother   . Hypertension Maternal Aunt     Social History Social History  Substance Use Topics  . Smoking status: Never Smoker  . Smokeless tobacco: Never Used  . Alcohol use No    Level V  caveat: Unable to obtain Review of Systems due to aphasia     ____________________________________________   PHYSICAL EXAM:  VITAL SIGNS: ED Triage Vitals  Enc Vitals Group     BP 09/01/17 1051 (!) 175/87     Pulse Rate 09/01/17 1051 82     Resp 09/01/17 1051 16     Temp 09/01/17 1051 99.2 F (37.3 C)     Temp Source 09/01/17 1051 Oral     SpO2 09/01/17 1051 97 %     Weight 09/01/17 1054 67.7 kg (149 lb 4.8 oz)     Height 09/01/17 1054 1.651 m ( )     Head Circumference --      Peak Flow --      Pain Score --      Pain Loc --      Pain Edu? --      Excl. in GC? --    Constitutional: Alert.  No acute distress.  Eyes:  Conjunctivae are normal. PERRLA Head: Atraumatic. Nose: No congestion/rhinnorhea. Mouth/Throat: Mucous membranes are moist.   Cardiovascular: Grossly normal heart sounds.  Good peripheral circulation. Respiratory: Normal respiratory effort.  No retractions. Lungs CTAB. Gastrointestinal: Soft and nontender. No distention.  No CVA tenderness.  Musculoskeletal:   Warm and well perfused Neurologic:  Patient with expressive aphasia. Appears to have equal strength in all extremities, cranial nerves appear intact Skin:  Skin is warm, dry and intact. No rash noted. Psychiatric: unable to examine  ____________________________________________   LABS (all labs ordered are listed, but only abnormal results are displayed)  Labs Reviewed  CBC - Abnormal; Notable for the following:       Result Value   RBC 3.76 (*)    Hemoglobin 11.4 (*)    HCT 33.9 (*)    All other components within normal limits  DIFFERENTIAL - Abnormal; Notable for the following:    Neutro Abs 6.6 (*)    Lymphs Abs 0.6 (*)    Monocytes Absolute 0.1 (*)    All other components within normal limits  COMPREHENSIVE METABOLIC PANEL - Abnormal; Notable for the following:    Glucose, Bld 196 (*)    All other components within normal limits  ETHANOL  PROTIME-INR  APTT  TROPONIN I  URINE  DRUG SCREEN, QUALITATIVE ([facility] ONLY)  URINALYSIS, ROUTINE W REFLEX MICROSCOPIC   ____________________________________________  EKG  ED ECG REPORT I, Jene Every, the attending physician, personally viewed and interpreted this ECG.  Date: 09/01/2017 EKG Time: 11:07 AM Rate: 80 Rhythm: normal sinus rhythm QRS Axis: normal Intervals: normal ST/T Wave abnormalities: normal Narrative Interpretation: no evidence of acute ischemia  ____________________________________________  RADIOLOGY  Ct head, + intracranial hemorrhage ____________________________________________   PROCEDURES  Procedure(s) performed: No    Critical Care performed: yes  CRITICAL CARE Performed by: Jene Every   Total critical care time: 40 minutes  Critical care time was exclusive of separately billable procedures and treating other patients.  Critical care was necessary to treat or prevent imminent or life-threatening deterioration.  Critical care was time spent personally by me on the following activities: development of treatment plan with patient and/or surrogate as well as nursing, discussions with consultants, evaluation of patient's response to treatment, examination of patient, obtaining history from patient or surrogate, ordering and performing treatments and interventions, ordering and review of laboratory studies, ordering and review of radiographic studies, pulse oximetry and re-evaluation of patient's condition.  ____________________________________________   INITIAL IMPRESSION / ASSESSMENT AND PLAN / ED COURSE  Pertinent labs & imaging results that were available during my care of the patient were reviewed by me and considered in my medical decision making (see chart for details).  Patient presents with aphasia, no other neuro deficits noted. Last known well at 11 AM yesterday. Hence she is outside the window for TPA or intervention. Certainly her presentation is concerning for  CVA, stat CT head and labs pending  ----------------------------------------- 11:51 AM on 09/01/2017 -----------------------------------------  Called by radiologist because of bleed noted on CT head. Discussed with Dr. Adriana Simas of neurosurgery, recommend transfer. Patient's daughter has requested [facility] because this is close to her house. I will start the patient on a Cardene drip with goal sbp ~140   ----------------------------------------- 12:42 PM on 09/01/2017 -----------------------------------------  Accepted by Dr. Otelia Limes of Neurology. Notified Dr. Jordan Likes of Neurosurgery as well. Patient and family updated.   ----------------------------------------- 2:16 PM on 09/01/2017 -----------------------------------------  Reevaluated patient, she is slightly more agitated but still maintaining airway  and I do not feel intubation is necessary at this time. Blood pressure has over corrected, we have turned off the nicardipine drip.    ____________________________________________   FINAL CLINICAL IMPRESSION(S) / ED DIAGNOSES  Final diagnoses:  Intracranial hemorrhage (HCC)      NEW MEDICATIONS STARTED DURING THIS VISIT:  New Prescriptions   No medications on file     Note:  This document was prepared using Dragon voice recognition software and may include unintentional dictation errors.    Jene EveryKinner, Samanthia Howland, MD 09/01/17 1342    Jene EveryKinner, Anavi Branscum, MD 09/01/17 1416

## 2017-09-01 NOTE — ED Triage Notes (Signed)
Arrives via [facility].  Daughter called this morning due to mother not answering phone.  When daughter went over to mother's home, found patient on couch with a glass dropped in the kitchen.  Unknown if patient fell.  Last known well yesterday at 1100 at church.

## 2017-09-01 NOTE — ED Notes (Signed)
CBG 172 

## 2017-09-01 NOTE — Progress Notes (Signed)
Pt has allergy to CT contrast. MD made aware verbal order given to cancel CT. Will continue to monitor.

## 2017-09-01 NOTE — H&P (Addendum)
Chief Complaint:  Altered mental status/ICH   History obtained from:   Chart     HPI:                                                                                                                                       Yvette RakersMary L Collier is an 76 y.o.  AA  female past medical history of hypertension, diabetes mellitus, COPD [facility] after being  diagnosed with [facility] after presenting with altered mental status earlier this morning.  The patient is aphasic history was obtained by chart review. According to the ER provider notes, the patient was last seen normal of 11 AM the day before. Today her daughter went to her house that she was not able to get in touch with her over the phone. She has found on the sofa and apparently there was a questionable fall. Bp was elevated at 175 SBP on arrival and patient was started on Nicardipine drip.    Date last known well: 9.9.18 Time last known well: 11 am tPA Given: No, ICH NIHSS          Modified Rankin: 0  Intracerebral Hemorrhage (ICH) Score  Glascow Coma Score  5-12 - 1  Age >/= 80 no 0  ICH volume >/= 30ml  yes +1  IVH no 0  Infratentorial origin no 0 Total:  2  Past Medical History:  Diagnosis Date  . Asthma   . Chronic lower back pain   . COPD (chronic obstructive pulmonary disease) (HCC)   . Diabetes mellitus without complication (HCC)   . Gastrointestinal bleed   . GERD (gastroesophageal reflux disease)   . History of colon polyps   . Hypertension   . Hypertensive heart disease   . IBS (irritable bowel syndrome)   . Ischemic colitis (HCC)   . Non-obstructive Coronary Artery Disease    a. 05/2009 Cath [facility]): minimal nonobs dzs.  . Osteoarthritis   . PAF (paroxysmal atrial fibrillation) (HCC)    a. 09/2014 during GI illness;  b. CHA2DS2VASc = 5 (not currently on OAC 2/2 h/o GIB/ischemic colitis); c. 09/2014 Echo: EF 60-65%, imparied relaxation, nl RV  size/fxn.  . Transient cerebral ischemia due to atrial fibrillation [facility](HCC)     Past Surgical History:  Procedure Laterality Date  . ABDOMINAL HYSTERECTOMY    . APPENDECTOMY    . BACK SURGERY     x 3, last 2004.  . CHOLECYSTECTOMY    . COLONOSCOPY WITH PROPOFOL N/A 03/20/2017   Procedure: COLONOSCOPY WITH PROPOFOL;  Surgeon: Wyline MoodKiran Anna, MD;  [facility];  Service: Endoscopy;  Laterality: N/A;  . ESOPHAGOGASTRODUODENOSCOPY (EGD) WITH PROPOFOL N/A 03/20/2017   Procedure: ESOPHAGOGASTRODUODENOSCOPY (EGD) WITH PROPOFOL;  Surgeon: Wyline MoodKiran Anna, MD;  Location: [facility] ENDOSCOPY;  Service: Endoscopy;  Laterality: N/A;  . FOOT SURGERY Right   . HEMORRHOID BANDING    .  left total hip arthroplasty  2012  . STOMACH SURGERY    . TONSILLECTOMY      Family History  Problem Relation Age of Onset  . Heart attack Mother   . Hypertension Mother   . Heart disease Father   . Hypertension Father   . Hypertension Brother   . Hypertension Maternal Aunt    Social History:  reports that she has never smoked. She has never used smokeless tobacco. She reports that she does not drink alcohol or use drugs.  Allergies:  Allergies  Allergen Reactions  . Lisinopril Swelling and Other (See Comments)    Reaction:  Tongue/mouth swelling   . Effexor [Venlafaxine] Hives  . Hctz [Hydrochlorothiazide]   . Ivp Dye [Iodinated Diagnostic Agents] Hives  . Prednisone   . Sulfa Antibiotics Hives    Medications:                                                                                                                           Reviewed   ROS  unable to obtain due to mental status    Examination:                                                                       General: Appears well-developed and well-nourished.  Psych: Confused Eyes: No scleral injection HENT: No OP obstrucion Head: Normocephalic.  Cardiovascular: Normal rate and regular rhythm.  Respiratory: Effort normal and breath  sounds normal to anterior ascultation GI: Soft.  No distension. There is no tenderness.  Skin: WDI   Neurological Examination Mental Status: Patient is alert, however unable to answer questions. Her speech is word salad. She is unable to comprehend, repeat and does not follow simple commands. No obvious dysarthria Cranial Nerves: No gaze deviation was noted, blinks to threat bilaterally. Tracks eyes in both directions and no obvious nystagmus noted. Face appears symmetric and tongue appears to be midline. Motor: Normal tone and bulk normal 4 extremities. There was no drift appreciated in either upper extremity and she moves both lower extremities well. Sensory: Responds to light noxious stimulus bilaterally Deep Tendon Reflexes: 2+ and symmetric throughout Plantars: Right: downgoing   Left: downgoing Cerebellar: No obvious nystagmus, ataxia was noted Gait: Unable to assess   NIHSS 8  Lab Results: Basic Metabolic Panel:  Recent Labs Lab 09/01/17 1100  NA 140  K 3.9  CL 103  CO2 26  GLUCOSE 196*  BUN 15  CREATININE 0.87  CALCIUM 9.8    CBC:  Recent Labs Lab 09/01/17 1100  WBC 7.3  NEUTROABS 6.6*  HGB 11.4*  HCT 33.9*  MCV 90.0  PLT 293    Coagulation Studies:  Recent Labs  09/01/17 1100  LABPROT 13.6  INR 1.05    Imaging: Dg Pelvis 1-2 Views  Result Date: 09/01/2017 CLINICAL DATA:  Possible fall. EXAM: PELVIS - 1-2 VIEW COMPARISON:  Radiograph dated 12/25/2016 FINDINGS: There is no evidence of pelvic fracture or diastasis. No pelvic bone lesions are seen. Left total hip prosthesis. Fusion hardware in the lower lumbar spine. IMPRESSION: No acute abnormalities. Electronically Signed   By: Francene Boyers M.D.   On: 09/01/2017 12:04   Ct Head Wo Contrast  Result Date: 09/01/2017 CLINICAL DATA:  Found down this morning. Unknown if patient fell. Focal neuro deficit greater than 6 hours, stroke suspected. EXAM: CT HEAD WITHOUT CONTRAST CT CERVICAL SPINE  WITHOUT CONTRAST TECHNIQUE: Multidetector CT imaging of the head and cervical spine was performed following the standard protocol without intravenous contrast. Multiplanar CT image reconstructions of the cervical spine were also generated. COMPARISON:  CT head 06/21/2017.  CT cervical spine 06/14/2013. FINDINGS: CT HEAD FINDINGS Brain: There is a large acute hematoma posteriorly in the left temporal lobe, measuring up to 5.1 x 2.4 x 2.6 cm. This contains a fluid- fluid level and is associated with surrounding vasogenic edema. There is mild resulting local mass effect, but no focal herniation or hydrocephalus. There is no extra-axial fluid collection or midline shift. Mild underlying chronic small vessel ischemic changes are present in the periventricular Matthew matter. Vascular: Mild intracranial vascular calcifications. Skull: Intact without acute findings. Sinuses/Orbits: The visualized paranasal sinuses, mastoid air cells and middle ears are clear. The orbital contents appear unremarkable. Other: None. CT CERVICAL SPINE FINDINGS Alignment: Mild cervicothoracic scoliosis. No evidence of traumatic subluxation. Skull base and vertebrae: No evidence of acute cervical spine fracture. Soft tissues and spinal canal: No prevertebral fluid or swelling. No visible canal hematoma. Disc levels: Minimal spondylosis for age. No high-grade spinal stenosis. Upper chest: Unremarkable. Other: None. IMPRESSION: 1. Large acute left temporal lobe hematoma (approximately 15.9 ml), atypical for hypertensive hemorrhage. Consider hypertensive stroke and amyloid angiopathy. 2. No evidence of acute cervical spine fracture, traumatic subluxation or static signs of instability. 3. Critical Value/emergent results were called by telephone at the time of interpretation on 09/01/2017 at 11:45 am to Dr. Jene Every , who verbally acknowledged these results. Electronically Signed   By: Carey Bullocks M.D.   On: 09/01/2017 12:02   Ct Cervical  Spine Wo Contrast  Result Date: 09/01/2017 CLINICAL DATA:  Found down this morning. Unknown if patient fell. Focal neuro deficit greater than 6 hours, stroke suspected. EXAM: CT HEAD WITHOUT CONTRAST CT CERVICAL SPINE WITHOUT CONTRAST TECHNIQUE: Multidetector CT imaging of the head and cervical spine was performed following the standard protocol without intravenous contrast. Multiplanar CT image reconstructions of the cervical spine were also generated. COMPARISON:  CT head 06/21/2017.  CT cervical spine 06/14/2013. FINDINGS: CT HEAD FINDINGS Brain: There is a large acute hematoma posteriorly in the left temporal lobe, measuring up to 5.1 x 2.4 x 2.6 cm. This contains a fluid- fluid level and is associated with surrounding vasogenic edema. There is mild resulting local mass effect, but no focal herniation or hydrocephalus. There is no extra-axial fluid collection or midline shift. Mild underlying chronic small vessel ischemic changes are present in the periventricular Renken matter. Vascular: Mild intracranial vascular calcifications. Skull: Intact without acute findings. Sinuses/Orbits: The visualized paranasal sinuses, mastoid air cells and middle ears are clear. The orbital contents appear unremarkable. Other: None. CT CERVICAL SPINE FINDINGS Alignment: Mild cervicothoracic scoliosis. No evidence of traumatic subluxation. Skull base and vertebrae: No evidence  of acute cervical spine fracture. Soft tissues and spinal canal: No prevertebral fluid or swelling. No visible canal hematoma. Disc levels: Minimal spondylosis for age. No high-grade spinal stenosis. Upper chest: Unremarkable. Other: None. IMPRESSION: 1. Large acute left temporal lobe hematoma (approximately 15.9 ml), atypical for hypertensive hemorrhage. Consider hypertensive stroke and amyloid angiopathy. 2. No evidence of acute cervical spine fracture, traumatic subluxation or static signs of instability. 3. Critical Value/emergent results were called by  telephone at the time of interpretation on 09/01/2017 at 11:45 am to Dr. Jene Every , who verbally acknowledged these results. Electronically Signed   By: Carey Bullocks M.D.   On: 09/01/2017 12:02     ASSESSMENT AND PLAN    76 y.o.  AA  female past medical history of hypertension, diabetes mellitus, COPD with left temporal hemorrhage. 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 Other etiologies include hypertensive hemorrhage and HSV encephalitis (less likely). Coags are normal.  Left Temporal hemorrhage Aphasia Hypertensive Urgency Vasogenic Cerebral edema with midline shift   Plan Admit to ICU Repeat CT head in 6 hours CT angiogram is not possible due to contrast allergy, consider performing  MR angiogram MRI head with SWI sequence No antiplatelets, will hold home ASA SCDs for DVT prophylaxis PT/OT Dysphagia evaluation first thing tomorrow morning   Hypertension Continue with nicardipine drip and titrate to goal SBP less than 140 /90 Consider restarting home medications'  Diabetes Mellitus SS insulin  Will hold oral hypoglycemincs  Pain Will hold home pain medications for now  Consider restarting gradually PRN to minimize sedation   I was involved in the diagnosis and treatment of this neurologically critical ill patient and spent 60 minutes taking care of this patient.   Georgiana Spinner Bobie Caris MD [facility] [phone]   If 7pm to 7am, please call on call as listed on AMION.

## 2017-09-01 NOTE — ED Notes (Signed)
EMTALA reviewed for completion

## 2017-09-02 ENCOUNTER — Inpatient Hospital Stay (HOSPITAL_COMMUNITY): Payer: Medicare Other

## 2017-09-02 DIAGNOSIS — I10 Essential (primary) hypertension: Secondary | ICD-10-CM

## 2017-09-02 DIAGNOSIS — Z8719 Personal history of other diseases of the digestive system: Secondary | ICD-10-CM

## 2017-09-02 DIAGNOSIS — G8191 Hemiplegia, unspecified affecting right dominant side: Secondary | ICD-10-CM

## 2017-09-02 DIAGNOSIS — I480 Paroxysmal atrial fibrillation: Secondary | ICD-10-CM

## 2017-09-02 DIAGNOSIS — R4701 Aphasia: Secondary | ICD-10-CM

## 2017-09-02 DIAGNOSIS — E1159 Type 2 diabetes mellitus with other circulatory complications: Secondary | ICD-10-CM

## 2017-09-02 DIAGNOSIS — I5030 Unspecified diastolic (congestive) heart failure: Secondary | ICD-10-CM

## 2017-09-02 DIAGNOSIS — I611 Nontraumatic intracerebral hemorrhage in hemisphere, cortical: Principal | ICD-10-CM

## 2017-09-02 LAB — ECHOCARDIOGRAM COMPLETE
E decel time: 238 msec
EERAT: 4.59
FS: 32 % (ref 28–44)
Height: 65 in
IV/PV OW: 1.04
LA ID, A-P, ES: 31 mm
LA diam end sys: 31 mm
LA vol A4C: 25.1 ml
LA vol index: 17.7 mL/m2
LADIAMINDEX: 1.75 cm/m2
LAVOL: 31.4 mL
LV e' LATERAL: 10.6 cm/s
LVEEAVG: 4.59
LVEEMED: 4.59
LVOT SV: 62 mL
LVOT VTI: 24.6 cm
LVOT area: 2.54 cm2
LVOT diameter: 18 mm
LVOT peak grad rest: 8 mmHg
LVOTPV: 141 cm/s
MV Dec: 238
MVPKAVEL: 87 m/s
MVPKEVEL: 48.7 m/s
PW: 11.3 mm — AB (ref 0.6–1.1)
RV LATERAL S' VELOCITY: 11.2 cm/s
TAPSE: 25.6 mm
TDI e' lateral: 10.6
TDI e' medial: 5.91
WEIGHTICAEL: 2388.8 [oz_av]

## 2017-09-02 LAB — HEMOGLOBIN A1C
Hgb A1c MFr Bld: 6.8 % — ABNORMAL HIGH (ref 4.8–5.6)
MEAN PLASMA GLUCOSE: 148.46 mg/dL

## 2017-09-02 LAB — GLUCOSE, CAPILLARY
GLUCOSE-CAPILLARY: 133 mg/dL — AB (ref 65–99)
GLUCOSE-CAPILLARY: 98 mg/dL (ref 65–99)
Glucose-Capillary: 150 mg/dL — ABNORMAL HIGH (ref 65–99)
Glucose-Capillary: 113 mg/dL — ABNORMAL HIGH (ref 65–99)

## 2017-09-02 LAB — MRSA PCR SCREENING: MRSA by PCR: NEGATIVE

## 2017-09-02 MED ORDER — SIMVASTATIN 20 MG PO TABS
10.0000 mg | ORAL_TABLET | Freq: Every evening | ORAL | Status: DC
  Administered 2017-09-02 – 2017-09-03 (×2): 10 mg via ORAL
  Filled 2017-09-02 (×2): qty 1

## 2017-09-02 MED ORDER — LORAZEPAM 2 MG/ML IJ SOLN
2.0000 mg | Freq: Once | INTRAMUSCULAR | Status: AC
  Administered 2017-09-02: 2 mg via INTRAVENOUS
  Filled 2017-09-02: qty 1

## 2017-09-02 MED ORDER — METOPROLOL TARTRATE 12.5 MG HALF TABLET
12.5000 mg | ORAL_TABLET | Freq: Two times a day (BID) | ORAL | Status: DC
  Administered 2017-09-02 – 2017-09-04 (×4): 12.5 mg via ORAL
  Filled 2017-09-02 (×4): qty 1

## 2017-09-02 MED ORDER — INSULIN ASPART 100 UNIT/ML ~~LOC~~ SOLN: 0.0000 [IU] | Freq: Every day | SUBCUTANEOUS | Status: DC

## 2017-09-02 MED ORDER — LOSARTAN POTASSIUM 25 MG PO TABS
25.0000 mg | ORAL_TABLET | Freq: Every day | ORAL | Status: DC
  Administered 2017-09-03 – 2017-09-04 (×2): 25 mg via ORAL
  Filled 2017-09-02 (×2): qty 1

## 2017-09-02 MED ORDER — HYDRALAZINE HCL 20 MG/ML IJ SOLN: 10.0000 mg | INTRAMUSCULAR | Status: DC | PRN

## 2017-09-02 MED ORDER — PANTOPRAZOLE SODIUM 40 MG PO TBEC
40.0000 mg | DELAYED_RELEASE_TABLET | Freq: Every day | ORAL | Status: DC
  Administered 2017-09-03 – 2017-09-04 (×2): 40 mg via ORAL
  Filled 2017-09-02 (×2): qty 1

## 2017-09-02 MED ORDER — PREGABALIN 25 MG PO CAPS
25.0000 mg | ORAL_CAPSULE | Freq: Two times a day (BID) | ORAL | Status: DC
  Administered 2017-09-02 – 2017-09-04 (×4): 25 mg via ORAL
  Filled 2017-09-02 (×4): qty 1

## 2017-09-02 MED ORDER — INSULIN ASPART 100 UNIT/ML ~~LOC~~ SOLN
0.0000 [IU] | Freq: Three times a day (TID) | SUBCUTANEOUS | Status: DC
  Administered 2017-09-02 (×2): 2 [IU] via SUBCUTANEOUS
  Administered 2017-09-03: 3 [IU] via SUBCUTANEOUS
  Administered 2017-09-03: 2 [IU] via SUBCUTANEOUS
  Administered 2017-09-04: 3 [IU] via SUBCUTANEOUS
  Administered 2017-09-04: 2 [IU] via SUBCUTANEOUS

## 2017-09-02 NOTE — Plan of Care (Signed)
Problem: Education: Goal: Knowledge of disease or condition will improve Outcome: Not Progressing Patient severely aphasic; unable to understand at this point. Goal: Knowledge of secondary prevention will improve Outcome: Not Progressing Pt severely aphasic; unable to discuss at this time.   Problem: Coping: Goal: Ability to identify appropriate support needs will improve Outcome: Progressing Patient's family arrived at bedside

## 2017-09-02 NOTE — Progress Notes (Signed)
OT Cancellation Note  Patient Details Name: Yvette Collier MRN: [id number] DOB: 1941-01-02   Cancelled Treatment:    Reason Eval/Treat Not Completed: Patient not medically ready (bedrest)  Harolyn RutherfordJones, Zabella Wease B  [phone] 09/02/2017, 7:11 AM

## 2017-09-02 NOTE — Progress Notes (Signed)
PT Cancellation Note  Patient Details Name: Yvette RakersMary L Collier MRN: [id number] DOB: 1941-12-02   Cancelled Treatment:    Reason Eval/Treat Not Completed: Patient not medically ready. Pt currently on bedrest. Will await increased activity orders prior to initiating PT eval.    Yvette PearsonLaura D Celise Collier 09/02/2017, 9:13 AM  Yvette SlipperLaura Chirstopher Collier, PT, DPT Acute Rehabilitation Services Pager: [phone]

## 2017-09-02 NOTE — Evaluation (Cosign Needed)
Clinical/Bedside Swallow Evaluation Patient Details  Name: Yvette RakersMary L Collier MRN: [id number] Date of Birth: June 25, 1941  Today's Date: 09/02/2017 Time: SLP Start Time (ACUTE ONLY): 0840 SLP Stop Time (ACUTE ONLY): 0924 SLP Time Calculation (min) (ACUTE ONLY): 44 min  Past Medical History:  Past Medical History:  Diagnosis Date  . Asthma   . Chronic lower back pain   . COPD (chronic obstructive pulmonary disease) (HCC)   . Diabetes mellitus without complication (HCC)   . Gastrointestinal bleed   . GERD (gastroesophageal reflux disease)   . History of colon polyps   . Hypertension   . Hypertensive heart disease   . IBS (irritable bowel syndrome)   . Ischemic colitis (HCC)   . Non-obstructive Coronary Artery Disease    a. 05/2009 Cath [facility]): minimal nonobs dzs.  . Osteoarthritis   . PAF (paroxysmal atrial fibrillation) (HCC)    a. 09/2014 during GI illness;  b. CHA2DS2VASc = 5 (not currently on OAC 2/2 h/o GIB/ischemic colitis); c. 09/2014 Echo: EF 60-65%, imparied relaxation, nl RV size/fxn.  . Transient cerebral ischemia due to atrial fibrillation [facility](HCC)    Past Surgical History:  Past Surgical History:  Procedure Laterality Date  . ABDOMINAL HYSTERECTOMY    . APPENDECTOMY    . BACK SURGERY     x 3, last 2004.  . CHOLECYSTECTOMY    . COLONOSCOPY WITH PROPOFOL N/A 03/20/2017   Procedure: COLONOSCOPY WITH PROPOFOL;  Surgeon: Wyline MoodKiran Anna, MD;  [facility];  Service: Endoscopy;  Laterality: N/A;  . ESOPHAGOGASTRODUODENOSCOPY (EGD) WITH PROPOFOL N/A 03/20/2017   Procedure: ESOPHAGOGASTRODUODENOSCOPY (EGD) WITH PROPOFOL;  Surgeon: Wyline MoodKiran Anna, MD;  Location: [facility] ENDOSCOPY;  Service: Endoscopy;  Laterality: N/A;  . FOOT SURGERY Right   . HEMORRHOID BANDING    . left total hip arthroplasty  2012  . STOMACH SURGERY    . TONSILLECTOMY     HPI:  Ptis a 76 y.o.femalewith medical history of hypertension, diabetes mellitus, COPD and aphasic history. [facility] after presenting with altered mental status on 9/10. CT shows evolving left temporal lobe hematoma, relatively stable, 5 mm left to right midline shift without ventricular entrapment and trace left holo hemispheric subdural hematoma versus motion artifact.   Assessment / Plan / Recommendation Clinical Impression  Pt demonstrates no overt s/sx of aspiration after being observed with thin liquid, puree and solid consistencies. Pt requires max assistance with eating and drinking due to aphasia/ideational apraxia. She is unable to recognize objects such as a spoon/straw/cracker without max verbal/visual/tactile cueing within functional context. Once max cueing was given to drink from a straw or sip from cup, pt was able to do so with no concern. She was unable to recognize graham cracker and attempted to suck on it (as if it were a straw) rather than take a bite but when the graham cracker was placed between her teeth, she chewed and swallowed the bolus. Recommend full supervision/cueing during meal time, Dys 3 (mech soft) diet with thin liquids. Discussed with RN. Will f/u with diet check.   SLP Visit Diagnosis: Dysphagia, unspecified (R13.10)    Aspiration Risk       Diet Recommendation Dysphagia 3 (Mech soft);Thin liquid   Liquid Administration via: Cup;Straw Medication Administration: Whole meds with liquid Supervision: Full supervision/cueing for compensatory strategies Compensations: Small sips/bites;Slow rate Postural Changes: Seated upright at 90 degrees    Other  Recommendations Oral Care  Recommendations: Oral care BID   Follow up Recommendations Inpatient Rehab      Frequency and Duration min 1 x/week  1 week       Prognosis Prognosis for Safe Diet Advancement: Good      Swallow Study   General HPI: Ptis a 76 y.o.femalewith medical history of hypertension, diabetes mellitus,  COPD and aphasic history. She was transferred to [facility] after being diagnosed with a left temporal hemorrhage at [facility] after presenting with altered mental status on 9/10. CT shows evolving left temporal lobe hematoma, relatively stable, 5 mm left to right midline shift without ventricular entrapment and trace left holo hemispheric subdural hematoma versus motion artifact. Type of Study: Bedside Swallow Evaluation Diet Prior to this Study: NPO Temperature Spikes Noted: No Respiratory Status: Room air History of Recent Intubation: No Behavior/Cognition: Alert;Cooperative;Pleasant mood;Requires cueing Oral Cavity Assessment: Dry;Dried secretions Oral Care Completed by SLP: Yes Oral Cavity - Dentition: Dentures, top;Missing dentition Vision: Functional for self-feeding Self-Feeding Abilities: Total assist;Needs assist Patient Positioning: Upright in bed Baseline Vocal Quality: Normal    Oral/Motor/Sensory Function Overall Oral Motor/Sensory Function: Within functional limits   Ice Chips Ice chips: Within functional limits   Thin Liquid Thin Liquid: Within functional limits    Nectar Thick Nectar Thick Liquid: Not tested   Honey Thick Honey Thick Liquid: Not tested   Puree Puree: Within functional limits   Solid   GO   Solid: Within functional limits        Carmela Rima, Student SLP 09/02/2017,10:55 AM

## 2017-09-02 NOTE — Evaluation (Signed)
Physical Therapy Evaluation Patient Details Name: Yvette Collier MRN: [id number] DOB: 10/24/1941 Today's Date: 09/02/2017   History of Present Illness  Ptis a [age] y/ofemale with aPMH significant for HTN, DM, COPD [facility] after being diagnosed with [facility] after presenting with altered mental status earlier in the morning.  Clinical Impression  Pt admitted with above diagnosis. Pt currently with functional limitations due to the deficits listed below (see PT Problem List). 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 At the time of PT eval pt was able to perform transfers and ambulation with up to +2 mod assist for balance support and safety. Cognition was the biggest limiting factor during session, as pt was varied in effort for mobility. CIR pre-admission screen was initiated this session, as she could greatly benefit from the intensive rehab at d/c. Pt will benefit from skilled PT to increase their independence and safety with mobility to allow discharge to the venue listed below.       Follow Up Recommendations CIR;Supervision/Assistance - 24 hour    Equipment Recommendations   (TBD by progress with PT)    Recommendations for Other Services Rehab consult     Precautions / Restrictions Precautions Precautions: Fall Precaution Comments: Posey Belt Restrictions Weight Bearing Restrictions: No      Mobility  Bed Mobility Overal bed mobility: Needs Assistance Bed Mobility: Supine to Sit     Supine to sit: Mod assist     General bed mobility comments: Assist for LE advancement towards EOB. Pt rquired tactile cues to reach forward and flex trunk. Therapist assist to elevate trunk into full sitting position provided.   Transfers Overall transfer level: Needs assistance Equipment used: 2 person hand held assist Transfers: Sit to/from [employer]Stand;Stand Pivot  Transfers Sit to Stand: +2 physical assistance;Min assist Stand pivot transfers: Mod assist       General transfer comment: Stand pivot bed to chair when pt not initiating movement. When assisting therapists, was able to power-up to full stand with +2 min assist for balance support and safety.   Ambulation/Gait Ambulation/Gait assistance: Min guard Ambulation Distance (Feet): 5 Feet Assistive device: 2 person hand held assist Gait Pattern/deviations: Step-through pattern;Decreased stride length;Trunk flexed Gait velocity: Decreased Gait velocity interpretation: Below normal speed for age/gender General Gait Details: Pt attempting to ambulate in room however was actively urinating while standing, and required assist to return to sitting for clean-up. Pt ambulated ~5 feet total.   Stairs            Wheelchair Mobility    Modified Rankin (Stroke Patients Only) Modified Rankin (Stroke Patients Only) Pre-Morbid Rankin Score: No symptoms Modified Rankin: Moderately severe disability     Balance Overall balance assessment: Needs assistance Sitting-balance support: Feet supported;No upper extremity supported Sitting balance-Leahy Scale: Poor Sitting balance - Comments: Hands-on assist required at all times to maintain seated balance Postural control: Posterior lean Standing balance support: Bilateral upper extremity supported Standing balance-Leahy Scale: Poor Standing balance comment: Assist required at all times for static standing.                              Pertinent Vitals/Pain Faces Pain Scale: No hurt    Home Living Family/patient expects to be discharged to:: Private residence Living Arrangements: Alone Available Help at Discharge: Family;Available PRN/intermittently Type of Home: House Home Access: Level  entry     Home Layout: One level Home Equipment: Cane - single point;Bedside commode      Prior Function Level of Independence: Independent          Comments: driving, independent with everything     Hand Dominance   Dominant Hand: Right    Extremity/Trunk Assessment   Upper Extremity Assessment Upper Extremity Assessment: Defer to OT evaluation    Lower Extremity Assessment Lower Extremity Assessment: Difficult to assess due to impaired cognition    Cervical / Trunk Assessment Cervical / Trunk Assessment: Kyphotic;Other exceptions Cervical / Trunk Exceptions: Per nephew, pt with difficulty extending neck to neutral at baseline.  Communication   Communication: Expressive difficulties  Cognition Arousal/Alertness: Awake/alert Behavior During Therapy: Restless Overall Cognitive Status: Impaired/Different from baseline Area of Impairment: Orientation;Attention;Memory;Following commands;Safety/judgement;Awareness;Problem solving                 Orientation Level: Disoriented to;Person;Place;Time;Situation Current Attention Level: Sustained Memory: Decreased short-term memory Following Commands: Follows one step commands inconsistently;Follows one step commands with increased time Safety/Judgement: Decreased awareness of safety;Decreased awareness of deficits Awareness: Intellectual Problem Solving: Slow processing;Decreased initiation;Difficulty sequencing;Requires verbal cues;Requires tactile cues        General Comments      Exercises     Assessment/Plan    PT Assessment Patient needs continued PT services  PT Problem List Decreased strength;Decreased range of motion;Decreased activity tolerance;Decreased balance;Decreased mobility;Decreased knowledge of use of DME;Decreased knowledge of precautions;Decreased safety awareness;Decreased coordination;Decreased cognition       PT Treatment Interventions DME instruction;Stair training;Gait training;Functional mobility training;Therapeutic activities;Therapeutic exercise;Neuromuscular re-education;Patient/family education;Cognitive remediation     PT Goals (Current goals can be found in the Care Plan section)  Acute Rehab PT Goals Patient Stated Goal: Pt did not state goals, and family's goal is to regain as much independence as possible.  PT Goal Formulation: Patient unable to participate in goal setting Time For Goal Achievement: 09/16/17 Potential to Achieve Goals: Good    Frequency Min 3X/week   Barriers to discharge Decreased caregiver support Unsure if family can provide 24 hour assist, however they said they would be willing to hire outside assist if needed.    Co-evaluation               AM-PAC PT "6 Clicks" Daily Activity  Outcome Measure Difficulty turning over in bed (including adjusting bedclothes, sheets and blankets)?: Unable Difficulty moving from lying on back to sitting on the side of the bed? : Unable Difficulty sitting down on and standing up from a chair with arms (e.g., wheelchair, bedside commode, etc,.)?: Unable Help needed moving to and from a bed to chair (including a wheelchair)?: A Lot Help needed walking in hospital room?: A Lot Help needed climbing 3-5 steps with a railing? : Total 6 Click Score: 8    End of Session Equipment Utilized During Treatment: Gait belt Activity Tolerance: Patient tolerated treatment well Patient left: in chair;with call bell/phone within reach;with family/visitor present;with chair alarm set;with restraints reapplied Nurse Communication: Mobility status PT Visit Diagnosis: Unsteadiness on feet (R26.81);Ataxic gait (R26.0);Other symptoms and signs involving the nervous system (R29.898)    Time: 9604-5409 PT Time Calculation (min) (ACUTE ONLY): 42 min   Charges:   PT Evaluation $PT Eval Moderate Complexity: 1 Mod PT Treatments $Gait Training: 23-37 mins   PT G Codes:        Conni Slipper, PT, DPT Acute Rehabilitation Services Pager: [phone]   Yvette Collier 09/02/2017, 3:20 PM

## 2017-09-02 NOTE — Consult Note (Signed)
Physical Medicine and Rehabilitation Consult Reason for Consult: Altered mental status with aphasia. Referring Physician: Dr.Xu   HPI: Yvette Collier is a 76 y.o.right handed  female with history of hypertension, diabetes mellitus, COPD. Patient lives alone independent prior to admission and works as a Education officer, environmental. One level home. Admitted 09/01/2017 with altered mental status as well as aphasia. Blood pressure elevated 175 systolic and was started on nicardipine drip. CT of the head showed a large acute left temporal lobe hematoma approximately 15.9 mL suspect hypertensive hemorrhage. CT cervical spine negative. Neurology follow-up with conservative care monitoring of blood pressure. Echocardiogram pending. Follow-up CT of head stable noted 5 mm left-to-right midline shift without ventricular entrapment. Mechanical soft diet. Physical therapy evaluation completed 09/02/2017 with recommendations of physical medicine rehabilitation consult.   Review of Systems  Constitutional: Negative for chills and fever.  HENT: Negative for hearing loss.   Eyes: Negative for blurred vision and double vision.  Respiratory: Positive for shortness of breath.   Cardiovascular: Positive for palpitations.  Gastrointestinal: Positive for constipation. Negative for nausea and vomiting.       GERD  Genitourinary: Negative for dysuria, flank pain and hematuria.  Musculoskeletal: Positive for back pain.  Skin: Negative for rash.  Neurological: Positive for headaches. Negative for seizures and weakness.  All other systems reviewed and are negative.  Past Medical History:  Diagnosis Date  . Asthma   . Chronic lower back pain   . COPD (chronic obstructive pulmonary disease) (HCC)   . Diabetes mellitus without complication (HCC)   . Gastrointestinal bleed   . GERD (gastroesophageal reflux disease)   . History of colon polyps   . Hypertension   . Hypertensive heart disease   . IBS (irritable bowel syndrome)     . Ischemic colitis (HCC)   . Non-obstructive Coronary Artery Disease    a. 05/2009 Cath [facility]): minimal nonobs dzs.  . Osteoarthritis   . PAF (paroxysmal atrial fibrillation) (HCC)    a. 09/2014 during GI illness;  b. CHA2DS2VASc = 5 (not currently on OAC 2/2 h/o GIB/ischemic colitis); c. 09/2014 Echo: EF 60-65%, imparied relaxation, nl RV size/fxn.  . Transient cerebral ischemia due to atrial fibrillation [facility])    Past Surgical History:  Procedure Laterality Date  . ABDOMINAL HYSTERECTOMY    . APPENDECTOMY    . BACK SURGERY     x 3, last 2004.  . CHOLECYSTECTOMY    . COLONOSCOPY WITH PROPOFOL N/A 03/20/2017   Procedure: COLONOSCOPY WITH PROPOFOL;  Surgeon: Wyline Mood, MD;  Location: [facility] ENDOSCOPY;  Service: Endoscopy;  Laterality: N/A;  . ESOPHAGOGASTRODUODENOSCOPY (EGD) WITH PROPOFOL N/A 03/20/2017   Procedure: ESOPHAGOGASTRODUODENOSCOPY (EGD) WITH PROPOFOL;  Surgeon: Wyline Mood, MD;  Location: [facility] ENDOSCOPY;  Service: Endoscopy;  Laterality: N/A;  . FOOT SURGERY Right   . HEMORRHOID BANDING    . left total hip arthroplasty  2012  . STOMACH SURGERY    . TONSILLECTOMY     Family History  Problem Relation Age of Onset  . Heart attack Mother   . Hypertension Mother   . Heart disease Father   . Hypertension Father   . Hypertension Brother   . Hypertension Maternal Aunt    Social History:  reports that she has never smoked. She has never used smokeless tobacco. She reports that she does not drink alcohol or use drugs. Allergies:  Allergies  Allergen Reactions  . Lisinopril Swelling and Other (See Comments)    Reaction:  Tongue/mouth swelling   .  Effexor [Venlafaxine] Hives  . Hctz [Hydrochlorothiazide] Other (See Comments)    unknown  . Ivp Dye [Iodinated Diagnostic Agents] Hives  . Prednisone Other (See Comments)    unspecified  . Sulfa Antibiotics Hives   Facility-Administered Medications Prior to Admission  Medication Dose Route Frequency Provider Last Rate Last Dose   . ceFAZolin (ANCEF) IVPB 1 g/50 mL premix  1 g Intravenous Once Ewing Schlein, MD      . ceFAZolin (ANCEF) IVPB 1 g/50 mL premix  1 g Intravenous Once Ewing Schlein, MD      . ceFAZolin (ANCEF) IVPB 1 g/50 mL premix  1 g Intravenous Once Ewing Schlein, MD      . lactated ringers infusion 1,000 mL  1,000 mL Intravenous Continuous Ewing Schlein, MD      . lactated ringers infusion 1,000 mL  1,000 mL Intravenous Continuous Ewing Schlein, MD 125 mL/hr at 08/14/16 0819 1,000 mL at 08/14/16 0819  . orphenadrine (NORFLEX) injection 60 mg  60 mg Intramuscular Once Ewing Schlein, MD      . orphenadrine (NORFLEX) injection 60 mg  60 mg Intramuscular Once Ewing Schlein, MD       Medications Prior to Admission  Medication Sig Dispense Refill  . cetirizine (ZYRTEC) 10 MG tablet Take 10 mg by mouth daily.    . Cholecalciferol (VITAMIN D3) 1000 units CAPS Take 1,000 Units by mouth daily.    Marland Kitchen DEXILANT 60 MG capsule TAKE ONE (1) CAPSULE EACH DAY FOR ACID REFLUX (Patient taking differently: TAKE 60 MG EACH DAY FOR ACID REFLUX) 30 capsule 3  . dicyclomine (BENTYL) 10 MG capsule TAKE 1 CAPSULE BY MOUTH 4 TIMES DAILY BEFORE MEALS AND AT BEDTIME (Patient taking differently: TAKE 10 MG BY MOUTH 4 TIMES DAILY BEFORE MEALS AND AT BEDTIME) 120 capsule 0  . losartan (COZAAR) 25 MG tablet Take 25 mg by mouth daily.     . metoprolol tartrate (LOPRESSOR) 25 MG tablet Take 0.5 tablets (12.5 mg total) by mouth 2 (two) times daily. 30 tablet 6  . naproxen (NAPROSYN) 250 MG tablet Take 250 mg by mouth 2 (two) times daily with a meal.    . oxyCODONE (OXY IR/ROXICODONE) 5 MG immediate release tablet Limit 1 tablet by mouth 2-3 times per day if tolerated (Patient taking differently: Take 5 mg by mouth See admin instructions. Limit 5 mg by mouth 2-3 times per day if tolerated) 90 tablet 0  . pregabalin (LYRICA) 25 MG capsule Limit  1 tablet by mouth per day or twice per day if tolerated (Patient taking differently: Take 25 mg  by mouth See admin instructions. Take 25 mg by mouth per day or twice per day if tolerated) 60 capsule 0  . simvastatin (ZOCOR) 10 MG tablet Take 10 mg by mouth every evening.     Marland Kitchen aspirin EC 81 MG tablet Take 81 mg by mouth daily.    Marland Kitchen linaclotide (LINZESS) 145 MCG CAPS capsule Take 1 capsule (145 mcg total) by mouth daily before breakfast. (Patient not taking: Reported on 09/02/2017) 30 capsule 2  . omeprazole (PRILOSEC) 40 MG capsule Take 1 capsule (40 mg total) by mouth daily. (Patient not taking: Reported on 09/02/2017) 90 capsule 3  . Probiotic Product (VSL#3) CAPS Take 1 capsule by mouth 2 (two) times daily. 60 capsule 11  . sucralfate (CARAFATE) 1 GM/10ML suspension Take 10 mLs (1 g total) by mouth 4 (four) times daily. (Patient not taking: Reported on 09/02/2017) 420 mL 1    Home:  Home Living Family/patient expects to be discharged to:: Private residence Living Arrangements: Alone Available Help at Discharge: Family, Available PRN/intermittently Type of Home: House Home Access: Level entry Home Layout: One level Bathroom Shower/Tub: Engineer, manufacturing systems: Handicapped height Bathroom Accessibility: Yes Home Equipment: Cane - single point, Bedside commode  Functional History: Prior Function Level of Independence: Independent Comments: driving, independent with everything Functional Status:  Mobility: Bed Mobility Overal bed mobility: Needs Assistance Bed Mobility: Supine to Sit Supine to sit: Mod assist General bed mobility comments: Assist for LE advancement towards EOB. Pt rquired tactile cues to reach forward and flex trunk. Therapist assist to elevate trunk into full sitting position provided.  Transfers Overall transfer level: Needs assistance Equipment used: 2 person hand held assist Transfers: Sit to/from Stand, Stand Pivot Transfers Sit to Stand: +2 physical assistance, Min assist Stand pivot transfers: Mod assist General transfer comment: Patient required  less assistance when determined to perform activity  but was unable to cognitively follow directions at all times and therefore required constant verbal and tactile cuing to perform transfers. Ambulation/Gait Ambulation/Gait assistance: Min guard Ambulation Distance (Feet): 5 Feet Assistive device: 2 person hand held assist Gait Pattern/deviations: Step-through pattern, Decreased stride length, Trunk flexed General Gait Details: Pt attempting to ambulate in room however was actively urinating while standing, and required assist to return to sitting for clean-up. Pt ambulated ~5 feet total.  Gait velocity: Decreased Gait velocity interpretation: Below normal speed for age/gender    ADL:    Cognition: Cognition Overall Cognitive Status: Impaired/Different from baseline Arousal/Alertness: Awake/alert Orientation Level: Other (comment) (Pt has aphasia) Attention: Sustained Sustained Attention: Impaired Sustained Attention Impairment: Verbal complex, Functional complex Awareness: Impaired Awareness Impairment: Intellectual impairment Problem Solving: Impaired Problem Solving Impairment: Functional basic, Verbal basic Behaviors: Restless Safety/Judgment: Impaired Cognition Arousal/Alertness: Awake/alert Behavior During Therapy: Restless Overall Cognitive Status: Impaired/Different from baseline Area of Impairment: Orientation, Attention, Memory, Following commands, Safety/judgement, Awareness, Problem solving Orientation Level: Disoriented to, Person, Place, Time, Situation Current Attention Level: Sustained Memory: Decreased short-term memory Following Commands: Follows one step commands inconsistently, Follows one step commands with increased time Safety/Judgement: Decreased awareness of safety, Decreased awareness of deficits Awareness: Intellectual Problem Solving: Slow processing, Decreased initiation, Difficulty sequencing, Requires verbal cues, Requires tactile cues  Blood  pressure 124/74, pulse 81, temperature (!) 101 F (38.3 C), temperature source Axillary, resp. rate 18, SpO2 100 %. Physical Exam  HENT:  Head: Normocephalic.  Eyes: EOM are normal.  Neck: Normal range of motion. Neck supple. No thyromegaly present.  Cardiovascular: Normal rate, regular rhythm and normal heart sounds.   Respiratory: Effort normal. No respiratory distress.  GI: Soft. Bowel sounds are normal. She exhibits no distension.  Neurological: She is alert.  Makes eye contact with examiner. Pleasantly confused with some word salad, rec>exp language deficits. She did follow some simple demonstrated commands but was inconsistent only following commands 10-20%. Was constantly laughing/in pleasant mood. RUE with grossly 1 to 1+/5 movement (inconsistent). RLE: tr to 1/5 with extensor tone/cogwheeling noted.   Skin: Skin is warm and dry.    Results for orders placed or performed during the hospital encounter of 09/01/17 (from the past 24 hour(s))  MRSA PCR Screening     Status: None   Collection Time: 09/01/17  9:01 PM  Result Value Ref Range   MRSA by PCR NEGATIVE NEGATIVE  Glucose, capillary     Status: Abnormal   Collection Time: 09/01/17 11:43 PM  Result Value Ref Range   Glucose-Capillary 196 (  H) 65 - 99 mg/dL  Hemoglobin [id number]     Status: Abnormal   Collection Time: 09/02/17  8:05 AM  Result Value Ref Range   Hgb A1c MFr Bld 6.8 (H) 4.8 - 5.6 %   Mean Plasma Glucose 148.46 mg/dL  Glucose, capillary     Status: Abnormal   Collection Time: 09/02/17  8:20 AM  Result Value Ref Range   Glucose-Capillary 150 (H) 65 - 99 mg/dL   Comment 1 Notify RN    Comment 2 Document in Chart   Glucose, capillary     Status: Abnormal   Collection Time: 09/02/17 11:34 AM  Result Value Ref Range   Glucose-Capillary 133 (H) 65 - 99 mg/dL   Comment 1 Notify RN    Comment 2 Document in Chart    Dg Pelvis 1-2 Views  Result Date: 09/01/2017 CLINICAL DATA:  Possible fall. EXAM: PELVIS - 1-2 VIEW  COMPARISON:  Radiograph dated 12/25/2016 FINDINGS: There is no evidence of pelvic fracture or diastasis. No pelvic bone lesions are seen. Left total hip prosthesis. Fusion hardware in the lower lumbar spine. IMPRESSION: No acute abnormalities. Electronically Signed   By: Francene BoyersJames  Maxwell M.D.   On: 09/01/2017 12:04   Ct Head Wo Contrast  Result Date: 09/02/2017 CLINICAL DATA:  Followup intracranial hemorrhage. EXAM: CT HEAD WITHOUT CONTRAST TECHNIQUE: Contiguous axial images were obtained from the base of the skull through the vertex without intravenous contrast. COMPARISON:  CT HEAD September 01, 2017 FINDINGS: Moderately motion degraded examination. BRAIN: Evolving 4 x 1 x 2.4 cm LEFT temporal intraparenchymal hematoma, relatively unchanged with surrounding low-density vasogenic edema. Regional mass effect. 5 mm LEFT-to-RIGHT midline shift. No ventricular entrapment. No hydrocephalus. No acute large vascular territory infarct. Patchy supratentorial Dobler matter hypodensities compatible with mild chronic small vessel ischemic disease, less than expected for age. Trace possible LEFT subdural hematoma versus motion artifact. Basal cisterns are patent. VASCULAR: Mild calcific atherosclerosis of the carotid siphons. SKULL: No skull fracture. No significant scalp soft tissue swelling. SINUSES/ORBITS: The mastoid air-cells and included paranasal sinuses are well-aerated.The included ocular globes and orbital contents are non-suspicious. OTHER: None. IMPRESSION: 1. Moderately motion degraded examination. 2. Evolving LEFT temporal lobe hematoma, relatively stable. 5 mm LEFT to RIGHT midline shift without ventricular entrapment. 3. Trace LEFT holo hemispheric subdural hematoma versus motion artifact. Electronically Signed   By: Awilda Metroourtnay  Bloomer M.D.   On: 09/02/2017 05:56   Ct Head Wo Contrast  Result Date: 09/01/2017 CLINICAL DATA:  Found down this morning. Unknown if patient fell. Focal neuro deficit greater than 6  hours, stroke suspected. EXAM: CT HEAD WITHOUT CONTRAST CT CERVICAL SPINE WITHOUT CONTRAST TECHNIQUE: Multidetector CT imaging of the head and cervical spine was performed following the standard protocol without intravenous contrast. Multiplanar CT image reconstructions of the cervical spine were also generated. COMPARISON:  CT head 06/21/2017.  CT cervical spine 06/14/2013. FINDINGS: CT HEAD FINDINGS Brain: There is a large acute hematoma posteriorly in the left temporal lobe, measuring up to 5.1 x 2.4 x 2.6 cm. This contains a fluid- fluid level and is associated with surrounding vasogenic edema. There is mild resulting local mass effect, but no focal herniation or hydrocephalus. There is no extra-axial fluid collection or midline shift. Mild underlying chronic small vessel ischemic changes are present in the periventricular Bendavid matter. Vascular: Mild intracranial vascular calcifications. Skull: Intact without acute findings. Sinuses/Orbits: The visualized paranasal sinuses, mastoid air cells and middle ears are clear. The orbital contents appear unremarkable. Other: None. CT  CERVICAL SPINE FINDINGS Alignment: Mild cervicothoracic scoliosis. No evidence of traumatic subluxation. Skull base and vertebrae: No evidence of acute cervical spine fracture. Soft tissues and spinal canal: No prevertebral fluid or swelling. No visible canal hematoma. Disc levels: Minimal spondylosis for age. No high-grade spinal stenosis. Upper chest: Unremarkable. Other: None. IMPRESSION: 1. Large acute left temporal lobe hematoma (approximately 15.9 ml), atypical for hypertensive hemorrhage. Consider hypertensive stroke and amyloid angiopathy. 2. No evidence of acute cervical spine fracture, traumatic subluxation or static signs of instability. 3. Critical Value/emergent results were called by telephone at the time of interpretation on 09/01/2017 at 11:45 am to Dr. Jene Every , who verbally acknowledged these results. Electronically  Signed   By: Carey Bullocks M.D.   On: 09/01/2017 12:02   Ct Cervical Spine Wo Contrast  Result Date: 09/01/2017 CLINICAL DATA:  Found down this morning. Unknown if patient fell. Focal neuro deficit greater than 6 hours, stroke suspected. EXAM: CT HEAD WITHOUT CONTRAST CT CERVICAL SPINE WITHOUT CONTRAST TECHNIQUE: Multidetector CT imaging of the head and cervical spine was performed following the standard protocol without intravenous contrast. Multiplanar CT image reconstructions of the cervical spine were also generated. COMPARISON:  CT head 06/21/2017.  CT cervical spine 06/14/2013. FINDINGS: CT HEAD FINDINGS Brain: There is a large acute hematoma posteriorly in the left temporal lobe, measuring up to 5.1 x 2.4 x 2.6 cm. This contains a fluid- fluid level and is associated with surrounding vasogenic edema. There is mild resulting local mass effect, but no focal herniation or hydrocephalus. There is no extra-axial fluid collection or midline shift. Mild underlying chronic small vessel ischemic changes are present in the periventricular Mccoll matter. Vascular: Mild intracranial vascular calcifications. Skull: Intact without acute findings. Sinuses/Orbits: The visualized paranasal sinuses, mastoid air cells and middle ears are clear. The orbital contents appear unremarkable. Other: None. CT CERVICAL SPINE FINDINGS Alignment: Mild cervicothoracic scoliosis. No evidence of traumatic subluxation. Skull base and vertebrae: No evidence of acute cervical spine fracture. Soft tissues and spinal canal: No prevertebral fluid or swelling. No visible canal hematoma. Disc levels: Minimal spondylosis for age. No high-grade spinal stenosis. Upper chest: Unremarkable. Other: None. IMPRESSION: 1. Large acute left temporal lobe hematoma (approximately 15.9 ml), atypical for hypertensive hemorrhage. Consider hypertensive stroke and amyloid angiopathy. 2. No evidence of acute cervical spine fracture, traumatic subluxation or  static signs of instability. 3. Critical Value/emergent results were called by telephone at the time of interpretation on 09/01/2017 at 11:45 am to Dr. Jene Every , who verbally acknowledged these results. Electronically Signed   By: Carey Bullocks M.D.   On: 09/01/2017 12:02    Assessment/Plan: Diagnosis: left temporal lobe hemorrhage with right hemiparesis and rec>exp language deficits 1. Does the need for close, 24 hr/day medical supervision in concert with the patient's rehab needs make it unreasonable for this patient to be served in a less intensive setting? Yes 2. Co-Morbidities requiring supervision/potential complications: HTN, post-hemorrhage sequelae, nutrition3 3. Due to bladder management, bowel management, safety, skin/wound care, disease management, medication administration, pain management and patient education, does the patient require 24 hr/day rehab nursing? Yes 4. Does the patient require coordinated care of a physician, rehab nurse, PT (1-2 hrs/day, 5 days/week), OT (1-2 hrs/day, 5 days/week) and SLP (1-2 hrs/day, 5 days/week) to address physical and functional deficits in the context of the above medical diagnosis(es)? Yes Addressing deficits in the following areas: balance, endurance, locomotion, strength, transferring, bowel/bladder control, bathing, dressing, feeding, grooming, toileting, cognition, speech, language, swallowing and  psychosocial support 5. Can the patient actively participate in an intensive therapy program of at least 3 hrs of therapy per day at least 5 days per week? Yes 6. The potential for patient to make measurable gains while on inpatient rehab is good 7. Anticipated functional outcomes upon discharge from inpatient rehab are supervision and min assist  with PT, supervision and min assist with OT, min assist and mod assist with SLP. 8. Estimated rehab length of stay to reach the above functional goals is: 12-18 days 9. Anticipated D/C setting:  Home 10. Anticipated post D/C treatments: HH therapy and Outpatient therapy 11. Overall Rehab/Functional Prognosis: excellent  RECOMMENDATIONS: This patient's condition is appropriate for continued rehabilitative care in the following setting: CIR Patient has agreed to participate in recommended program. N/A Note that insurance prior authorization may be required for reimbursement for recommended care.  Comment: Rehab Admissions Coordinator to follow up.  Thanks,  Ranelle Oyster, MD, [place]    Charlton Amor., PA-C 09/02/2017

## 2017-09-02 NOTE — Progress Notes (Signed)
Rehab Admissions Coordinator Note:  Patient was screened by Clois DupesBoyette, Nickoles Gregori Godwin for appropriateness for an Inpatient Acute Rehab Consult per PT and SLP recommendations.  At this time, we are recommending Inpatient Rehab consult.  Clois DupesBoyette, Sephira Zellman Godwin 09/02/2017, 3:32 PM  I can be reached at [phone].

## 2017-09-02 NOTE — Progress Notes (Signed)
During rounds chaplain observed family sitting together in the corner of the room.  The family were not in conversation.  The patient was sitting in the recliner taking a nap.  I identified the family as the Son, [place].  Chaplain introduced himself to the family and informed them of the many opportunities they have to engage with the spiritual care dept.  The family spent a few minutes in conversation with the chaplain.  Nurse came into the room to inform family that patient will be moved back into the bed shortly and inquired from family if there were any needs.  The chaplain had a brief conversation about hope which was a word mentioned by the family.

## 2017-09-02 NOTE — Progress Notes (Signed)
I await OT eval to then begin insurance authorization for a possible inpt rehab admission when pt medically ready. I will begin rehab venue discussions and goal with pt and family tomorrow. [phone]

## 2017-09-02 NOTE — Evaluation (Cosign Needed)
Speech Language Pathology Evaluation Patient Details Name: Yvette Collier MRN: [id number] DOB: 11/04/1941 Today's Date: 09/02/2017 Time: [id number] SLP Time Calculation (min) (ACUTE ONLY): 44 min  Problem List:  Patient Active Problem List   Diagnosis Date Noted  . Left temporal lobe hemorrhage (HCC) 09/01/2017  . Constipation 01/06/2017  . DJD (degenerative joint disease) of knee 07/18/2016  . GIB (gastrointestinal bleeding) 05/25/2016  . GI bleed 05/24/2016  . Degenerative joint disease (DJD) of hip 05/08/2016  . 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 Hypertensive heart disease   . Hemorrhage of gastrointestinal tract 11/13/2015  . Lumbosacral facet joint syndrome (HCC) 07/26/2015  . Angioedema 07/13/2015  . DM2 (diabetes mellitus, type 2) (HCC) 07/13/2015  . Status post lumbar laminectomy 06/19/2015  . Lumbar radiculopathy 06/19/2015  . DDD (degenerative disc disease), lumbar 05/11/2015  . Facet syndrome, lumbar (HCC) 05/11/2015  . Lumbar post-laminectomy syndrome 05/11/2015  . Sacroiliac joint disease 05/11/2015  . 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 Neuropathy due to secondary diabetes (HCC) 05/11/2015  . Essential hypertension 01/03/2015  . Coronary atherosclerosis of native coronary artery   . PAF (paroxysmal atrial fibrillation) (HCC) 10/20/2014  . Ischemic colitis (HCC) 10/20/2014  . Diabetes mellitus type 2 with complications (HCC) 10/20/2014  . COPD (chronic obstructive pulmonary disease) (HCC) 10/20/2014  . Hyperlipidemia 10/20/2014  . GERD (gastroesophageal reflux disease) 10/20/2014  . Ingrowing nail, right great toe 04/27/2014  . Internal hemorrhoid, bleeding 07/14/2013   Past Medical History:  Past Medical History:  Diagnosis Date  . Asthma   . Chronic lower back pain   . COPD (chronic obstructive pulmonary disease) (HCC)   . Diabetes mellitus without complication (HCC)   . Gastrointestinal bleed   . GERD  (gastroesophageal reflux disease)   . History of colon polyps   . Hypertension   . Hypertensive heart disease   . IBS (irritable bowel syndrome)   . Ischemic colitis (HCC)   . Non-obstructive Coronary Artery Disease    a. 05/2009 Cath [facility]): minimal nonobs dzs.  . Osteoarthritis   . PAF (paroxysmal atrial fibrillation) (HCC)    a. 09/2014 during GI illness;  b. CHA2DS2VASc = 5 (not currently on OAC 2/2 h/o GIB/ischemic colitis); c. 09/2014 Echo: EF 60-65%, imparied relaxation, nl RV size/fxn.  . Transient cerebral ischemia due to atrial fibrillation [facility](HCC)    Past Surgical History:  Past Surgical History:  Procedure Laterality Date  . ABDOMINAL HYSTERECTOMY    . APPENDECTOMY    . BACK SURGERY     x 3, last 2004.  . CHOLECYSTECTOMY    . COLONOSCOPY WITH PROPOFOL N/A 03/20/2017   Procedure: COLONOSCOPY WITH PROPOFOL;  Surgeon: Wyline MoodKiran Anna, MD;  [facility];  Service: Endoscopy;  Laterality: N/A;  . ESOPHAGOGASTRODUODENOSCOPY (EGD) WITH PROPOFOL N/A 03/20/2017   Procedure: ESOPHAGOGASTRODUODENOSCOPY (EGD) WITH PROPOFOL;  Surgeon: Wyline MoodKiran Anna, MD;  Location: [facility] ENDOSCOPY;  Service: Endoscopy;  Laterality: N/A;  . FOOT SURGERY Right   . HEMORRHOID BANDING    . left total hip arthroplasty  2012  . STOMACH SURGERY    . TONSILLECTOMY     HPI:  Ptis a 76 y.o.femalewith medical history of hypertension, diabetes mellitus, COPD and aphasic history. [facility] after being [facility] with [facility] after presenting with altered mental status on 9/10. CT shows evolving left temporal lobe hematoma, relatively stable, 5 mm left to right midline shift without ventricular entrapment and trace left holo  hemispheric subdural hematoma versus motion artifact.   Assessment / Plan / Recommendation Clinical Impression  Pt demonstrates speech and language characteristic of Wernicke's aphasia which correlates to the  left temporal site of lesion. Pt has moderate to severely impaired expressive and receptive language deficits and is unaware of these deficits when she speaks. She demonstrates in tact connected speech that is fluent and reflects appropriate prosody, rate and intonation but is jargon. Therapist attempted melodic intonation techniques with automatic language (singing, counting, naming) with no success aside from the pt humming along with the song. Able to follow 1-step commands with 25% accuracy given max visual and verbal cues within functional context. Pt unable to accurately respond to yes/no questions, perform repetition tasks, naming tasks or functional tasks (brushing teeth, using spoon) despite max verbal/visual/tactile cueing. Will f/u for functional communication.     SLP Assessment  SLP Recommendation/Assessment: Patient needs continued Speech Lanaguage Pathology Services SLP Visit Diagnosis: Aphasia (R47.01)    Follow Up Recommendations  Inpatient Rehab    Frequency and Duration min 2x/week  2 weeks      SLP Evaluation Cognition  Overall Cognitive Status: Impaired/Different from baseline Arousal/Alertness: Awake/alert Orientation Level: Other (comment) (Pt has aphasia) Attention: Sustained Sustained Attention: Impaired Sustained Attention Impairment: Verbal complex;Functional complex Awareness: Impaired Awareness Impairment: Intellectual impairment Problem Solving: Impaired Problem Solving Impairment: Functional basic;Verbal basic Behaviors: Restless Safety/Judgment: Impaired       Comprehension  Auditory Comprehension Overall Auditory Comprehension: Impaired Yes/No Questions: Impaired Basic Immediate Environment Questions: 0-24% accurate Commands: Impaired One Step Basic Commands: 0-24% accurate EffectiveTechniques: Visual/Gestural cues Visual Recognition/Discrimination Discrimination: Not tested Reading Comprehension Reading Status: Not tested    Expression  Expression Primary Mode of Expression: Verbal Verbal Expression Overall Verbal Expression: Impaired Initiation: No impairment Automatic Speech: Name;Counting;Singing;Social Response Level of Generative/Spontaneous Verbalization: Conversation (unintelligible jargon) Repetition: Impaired Level of Impairment: Word level;Phrase level;Sentence level Naming: Impairment Confrontation: Impaired Verbal Errors: Perseveration;Jargon;Not aware of errors;Phonemic paraphasias;Neologisms Pragmatics: No impairment Effective Techniques: Melodic intonation (Attempted MIT with no response) Written Expression Dominant Hand: Right Written Expression: Not tested   Oral / Motor  Oral Motor/Sensory Function Overall Oral Motor/Sensory Function: Within functional limits Motor Speech Overall Motor Speech: Appears within functional limits for tasks assessed   GO                    Carmela Rima, Student SLP 09/02/2017, 10:30 AM

## 2017-09-02 NOTE — Progress Notes (Signed)
Per Dr. Roda ShuttersXu, SBP parameters increased to 160. Cardene order changed as well.

## 2017-09-02 NOTE — Progress Notes (Signed)
STROKE TEAM PROGRESS NOTE   HISTORY OF PRESENT ILLNESS (per record) Yvette Collier is an 76 y.o.  AA female with a past medical history of hypertension, diabetes mellitus, COPD, and paroxysmal atrial fibrillation transferred to [facility] after presenting to [facility] with AMS and left temporal hemorrhage with a possible unwitnessed fall.  According to the ER provider notes, the patient was last seen normal of 11 AM the day before. Today her daughter went to her house that she was not able to get in touch with her over the phone. She has found on the sofa and apparently there was a questionable fall. Bp was elevated at 175 SBP on arrival and patient was started on Nicardipine drip.   Date last known well: 9.9.18 Time last known well: 11 am                                                                        Modified Rankin: 0  Intracerebral Hemorrhage (ICH) Score  Glascow Coma Score  5-12 - 1  Age >/= 80 no 0  ICH volume >/= 30ml  yes +1  IVH no 0  Infratentorial origin no 0 Total:  2  Patient was not administered IV t-PA secondary to ICH. She was admitted to the neuro ICU for further evaluation and treatment.   SUBJECTIVE (INTERVAL HISTORY) Her nurse is at the bedside. I later talked with son over the phone. The patient is awake alert, and somewhat hyperactive.  She is globally aphasic and unable to answer questions or follow verbal commands.  The patient demonstrates a left gaze preference and right gaze.  Of note, the patient is allergic to iodine contrast.   OBJECTIVE Temp:  [98.5 F (36.9 C)-100.7 F (38.2 C)] 98.9 F (37.2 C) (09/11 0800) Pulse Rate:  [75-146] 87 (09/11 0830) Cardiac Rhythm: Normal sinus rhythm (09/11 0000) Resp:  [14-27] 19 (09/11 0830) BP: (91-175)/(60-93) 111/61 (09/11 0830) SpO2:  [91 %-100 %] 99 % (09/11 0830) Weight:  [67.7 kg (149 lb 4.8 oz)] 67.7 kg (149 lb 4.8 oz) (09/10 1054)  CBC:   Recent Labs Lab 09/01/17 1100  WBC 7.3   NEUTROABS 6.6*  HGB 11.4*  HCT 33.9*  MCV 90.0  PLT 293    Basic Metabolic Panel:   Recent Labs Lab 09/01/17 1100  NA 140  K 3.9  CL 103  CO2 26  GLUCOSE 196*  BUN 15  CREATININE 0.87  CALCIUM 9.8    Lipid Panel: No results found for: CHOL, TRIG, HDL, CHOLHDL, VLDL, LDLCALC HgbA1c:  Lab Results  Component Value Date   HGBA1C 6.8 (H) 09/02/2017   Urine Drug Screen:     Component Value Date/Time   LABOPIA NONE DETECTED 09/01/2017 1422   COCAINSCRNUR NONE DETECTED 09/01/2017 1422   LABBENZ NONE DETECTED 09/01/2017 1422   AMPHETMU NONE DETECTED 09/01/2017 1422   THCU NONE DETECTED 09/01/2017 1422   LABBARB NONE DETECTED 09/01/2017 1422    Alcohol Level     Component Value Date/Time   ETH <5 09/01/2017 1100    IMAGING I have personally reviewed the radiological images below and agree with the radiology interpretations.  Ct Head Wo Contrast 09/02/2017 IMPRESSION: 1. Moderately motion degraded examination. 2. Evolving  LEFT temporal lobe hematoma, relatively stable. 5 mm LEFT to RIGHT midline shift without ventricular entrapment. 3. Trace LEFT holo hemispheric subdural hematoma versus motion artifact.   Ct Head Wo Contrast 09/01/2017 IMPRESSION: 1. Large acute left temporal lobe hematoma (approximately 15.9 ml), atypical for hypertensive hemorrhage. Consider hypertensive stroke and amyloid angiopathy. 2. No evidence of acute cervical spine fracture, traumatic subluxation or static signs of instability.  Ct Cervical Spine Wo Contrast 09/01/2017 IMPRESSION: 1. Large acute left temporal lobe hematoma (approximately 15.9 ml), atypical for hypertensive hemorrhage. Consider hypertensive stroke and amyloid angiopathy. 2. No evidence of acute cervical spine fracture, traumatic subluxation or static signs of instability. 3. Critical Value/emergent results were called by telephone at the time of interpretation on 09/01/2017 at 11:45 am to Dr. Jene EveryOBERT KINNER , who verbally  acknowledged these results. Electronically Signed   By: Carey BullocksWilliam  Veazey M.D.   On: 09/01/2017 12:02   TTE  - Left ventricle: The cavity size was normal. Wall thickness was   normal. Systolic function was normal. The estimated ejection   fraction was in the range of 60% to 65%. Wall motion was normal;   there were no regional wall motion abnormalities. Doppler   parameters are consistent with abnormal left ventricular   relaxation (grade 1 diastolic dysfunction). - Atrial septum: There was an atrial septal aneurysm. Impressions: - Normal LV systolic function; mild diastolic dysfunction; atrial   septal aneurysm.  CT repeat pending    PHYSICAL EXAM  Temp:  [98.5 F (36.9 C)-101 F (38.3 C)] 99.9 F (37.7 C) (09/11 1600) Pulse Rate:  [78-146] 81 (09/11 1400) Resp:  [14-25] 18 (09/11 1500) BP: (89-224)/(60-188) 124/74 (09/11 1500) SpO2:  [91 %-100 %] 100 % (09/11 1500)  General - Well nourished, well developed, in no apparent distress.  Ophthalmologic -  Fundi not visualized due to noncooperation.  Cardiovascular - Regular rate and rhythm.  Neuro - awake, alert, smile and laugh when we talk to her, however, not following commands and no speech output global aphasia. Eyes left gaze preference, but able to cross midline, right neglect. Not blinking to visual threat on the right. No significant facial asymmetry. tongue midline in mouth. Moving all extremities symmetrically. DTR 1+ and babinski not cooperative. Sensation, coordination and gait not tested.   ASSESSMENT/PLAN Ms. Tomma RakersMary L Laprise is a 76 y.o. female with history of COPD, DM, GIB, HTN, CAD, PAF not on AC due to GIB followed by Dr. Mariah MillingGollan presenting with AMS and likely fall at home. She did not receive IV t-PA due to ICH.   ICH:  left temporal moderate ICH and left frontal trace SDH, concerning for traumatic ICH due to fall. However, needs to rule out CAA, tumor or CVT (vein of labbe)   Resultant  Global aphasia  CT head  left temporal ICH  CT repeat stable ICH  MRI / MRA / MRV will consider once pt more cooperative  Carotid Doppler  Not indicated  2D Echo  EF 60-65%  LDL not ordered  HgbA1c 6.8  SCDs for VTE prophylaxis DIET DYS 3 Room service appropriate? Yes; Fluid consistency: Thin  aspirin 81 mg daily prior to admission, now on No antithrombotic  Ongoing aggressive stroke risk factor management  Therapy recommendations:  pending  Disposition:  Pending  PAF  Follows with Dr. Mariah MillingGollan  On metoprolol for rate control  12/2016 cardiac monitoring showed no afib  Not on AC due to hx of GIB  Hypertension  Stable  Off cardene  Resume  home meds - cozaar and metoprolol  Long-term BP goal < 160  Diabetes  HgbA1c 6.8, goal < 7.0  Controlled  SSI  Other Stroke Risk Factors  Advanced age  Coronary artery disease  Hx of TIA  Other Active Problems  Hx of GIB  COPD  Confusion - soft restrain  Hospital day # 1  This patient is critically ill due to left temporal ICH, global aphasia, DM, hypertensive emergency and at significant risk of neurological worsening, death form hematoma expansion, brain herniation, heart failure. This patient's care requires constant monitoring of vital signs, hemodynamics, respiratory and cardiac monitoring, review of multiple databases, neurological assessment, discussion with family, other specialists and medical decision making of high complexity. I spent 40 minutes of neurocritical care time in the care of this patient.  Marvel Plan, MD PhD Stroke Neurology 09/02/2017 5:30 PM  To contact Stroke Continuity provider, please refer to [email or web address]. After hours, contact General Neurology

## 2017-09-03 ENCOUNTER — Inpatient Hospital Stay (HOSPITAL_COMMUNITY): Payer: Medicare Other

## 2017-09-03 ENCOUNTER — Encounter (HOSPITAL_COMMUNITY): Payer: Self-pay | Admitting: *Deleted

## 2017-09-03 LAB — BASIC METABOLIC PANEL
Anion gap: 9 (ref 5–15)
BUN: 11 mg/dL (ref 6–20)
CHLORIDE: 105 mmol/L (ref 101–111)
CO2: 24 mmol/L (ref 22–32)
CREATININE: 0.87 mg/dL (ref 0.44–1.00)
Calcium: 9.5 mg/dL (ref 8.9–10.3)
GFR calc non Af Amer: >60 mL/min (ref >60)
Glucose, Bld: 102 mg/dL — ABNORMAL HIGH (ref 65–99)
Potassium: 3.4 mmol/L — ABNORMAL LOW (ref 3.5–5.1)
SODIUM: 138 mmol/L (ref 135–145)

## 2017-09-03 LAB — GLUCOSE, CAPILLARY
GLUCOSE-CAPILLARY: 109 mg/dL — AB (ref 65–99)
GLUCOSE-CAPILLARY: 168 mg/dL — AB (ref 65–99)
Glucose-Capillary: 125 mg/dL — ABNORMAL HIGH (ref 65–99)
Glucose-Capillary: 202 mg/dL — ABNORMAL HIGH (ref 65–99)

## 2017-09-03 LAB — CBC
HCT: 36.1 % (ref 36.0–46.0)
HEMOGLOBIN: 11.8 g/dL — AB (ref 12.0–15.0)
MCH: 29.6 pg (ref 26.0–34.0)
MCHC: 32.7 g/dL (ref 30.0–36.0)
MCV: 90.5 fL (ref 78.0–100.0)
Platelets: 280 10*3/uL (ref 150–400)
RBC: 3.99 MIL/uL (ref 3.87–5.11)
RDW: 13.1 % (ref 11.5–15.5)
WBC: 8.6 10*3/uL (ref 4.0–10.5)

## 2017-09-03 MED ORDER — ORAL CARE MOUTH RINSE
15.0000 mL | Freq: Two times a day (BID) | OROMUCOSAL | Status: DC
  Administered 2017-09-03 (×2): 15 mL via OROMUCOSAL

## 2017-09-03 MED ORDER — INFLUENZA VAC SPLIT HIGH-DOSE 0.5 ML IM SUSY
0.5000 mL | PREFILLED_SYRINGE | INTRAMUSCULAR | Status: AC
  Administered 2017-09-04: 0.5 mL via INTRAMUSCULAR
  Filled 2017-09-03: qty 0.5

## 2017-09-03 MED ORDER — PNEUMOCOCCAL VAC POLYVALENT 25 MCG/0.5ML IJ INJ
0.5000 mL | INJECTION | INTRAMUSCULAR | Status: AC
  Administered 2017-09-04: 0.5 mL via INTRAMUSCULAR
  Filled 2017-09-03: qty 0.5

## 2017-09-03 NOTE — Progress Notes (Signed)
Physical Therapy Treatment Patient Details Name: Yvette RakersMary L Collier MRN: [id number] DOB: Dec 24, 1940 Today's Date: 09/03/2017    History of Present Illness Ptis a [age] y/ofemale with aPMH significant for HTN, DM, COPD [facility] after being diagnosed with [facility] temporal [facility] after presenting with altered mental status earlier in the morning.    PT Comments    Patient required min A +2 for functional mobility and gait. Pt continues to be grossly steady while ambulating. Pt's mobility is challenged by communication deficits and cognition although cognition is difficult to assess. Continue to recommend CIR level therapies to maximize independence and safety with mobility.   Follow Up Recommendations  CIR;Supervision/Assistance - 24 hour     Equipment Recommendations   (TBD )    Recommendations for Other Services Rehab consult     Precautions / Restrictions Precautions Precautions: Fall Restrictions Weight Bearing Restrictions: No    Mobility  Bed Mobility Overal bed mobility: Needs Assistance Bed Mobility: Supine to Sit     Supine to sit: Min assist     General bed mobility comments: multimodal cues for initiation and follow through of task  Transfers Overall transfer level: Needs assistance Equipment used: 2 person hand held assist Transfers: Sit to/from Stand;Stand Pivot Transfers Sit to Stand: +2 physical assistance;Min assist Stand pivot transfers: +2 physical assistance;Min assist       General transfer comment: pt was able to communicate that she needs to urinate when sitting EOB; Min assist +2 (2 person handheld assist) for stability in standing.   Ambulation/Gait Ambulation/Gait assistance: Min assist;+2 safety/equipment Ambulation Distance (Feet): 6 Feet Assistive device: 2 person hand held assist Gait Pattern/deviations: Step-through pattern;Decreased stride length;Trunk flexed Gait velocity: Decreased   General Gait  Details: pt with guarded movements but grossly steady gait; pt required directional cues both visual and tactile    Stairs            Wheelchair Mobility    Modified Rankin (Stroke Patients Only)       Balance Overall balance assessment: Needs assistance Sitting-balance support: Feet supported;No upper extremity supported Sitting balance-Leahy Scale: Fair Sitting balance - Comments: Able to statically sit with supervision.    Standing balance support: Single extremity supported Standing balance-Leahy Scale: Poor Standing balance comment: Min assist to maintain stability during dynamic and static standing tasks.                             Cognition Arousal/Alertness: Awake/alert Behavior During Therapy: WFL for tasks assessed/performed Overall Cognitive Status: Difficult to assess Area of Impairment: Orientation;Attention;Memory;Following commands;Safety/judgement;Awareness;Problem solving                 Orientation Level: Disoriented to;Person;Place;Time;Situation Current Attention Level: Sustained Memory: Decreased short-term memory Following Commands: Follows one step commands inconsistently;Follows one step commands with increased time Safety/Judgement: Decreased awareness of safety;Decreased awareness of deficits Awareness: Intellectual Problem Solving: Slow processing;Decreased initiation;Difficulty sequencing;Requires verbal cues;Requires tactile cues General Comments: Significant praxis difficulties as detailed below. Pt improves in ability to follow commands with hand over hand assist for initiation and visual cues. Difficult to fully assess cognition due to communication difficulties.       Exercises      General Comments General comments (skin integrity, edema, etc.): HR up to 126 with mobility      Pertinent Vitals/Pain Pain Assessment: Faces Faces Pain Scale: No hurt    Home Living Family/patient expects to be discharged to::  Private residence Living Arrangements: Alone Available Help at Discharge: Family;Available PRN/intermittently Type of Home: House Home Access: Level entry   Home Layout: One level Home Equipment: Cane - single point;Bedside commode      Prior Function Level of Independence: Independent      Comments: driving, independent with everything   PT Goals (current goals can now be found in the care plan section) Acute Rehab PT Goals Patient Stated Goal: Pt did not state goals, and family's goal is to regain as much independence as possible.  Progress towards PT goals: Progressing toward goals    Frequency    Min 3X/week      PT Plan Current plan remains appropriate    Co-evaluation PT/OT/SLP Co-Evaluation/Treatment: Yes Reason for Co-Treatment: For patient/therapist safety;To address functional/ADL transfers PT goals addressed during session: Mobility/safety with mobility OT goals addressed during session: ADL's and self-care      AM-PAC PT "6 Clicks" Daily Activity  Outcome Measure  Difficulty turning over in bed (including adjusting bedclothes, sheets and blankets)?: Unable Difficulty moving from lying on back to sitting on the side of the bed? : Unable Difficulty sitting down on and standing up from a chair with arms (e.g., wheelchair, bedside commode, etc,.)?: Unable Help needed moving to and from a bed to chair (including a wheelchair)?: A Little Help needed walking in hospital room?: A Little Help needed climbing 3-5 steps with a railing? : Total 6 Click Score: 10    End of Session Equipment Utilized During Treatment: Gait belt Activity Tolerance: Patient tolerated treatment well Patient left: in chair;with call bell/phone within reach;with chair alarm set Nurse Communication: Mobility status PT Visit Diagnosis: Unsteadiness on feet (R26.81);Ataxic gait (R26.0);Other symptoms and signs involving the nervous system (R29.898)     Time: 9604-5409 PT Time  Calculation (min) (ACUTE ONLY): 24 min  Charges:  $Gait Training: 8-22 mins                    G Codes:       Erline Levine, PTA Pager: [phone]     Carolynne Edouard 09/03/2017, 1:23 PM

## 2017-09-03 NOTE — Progress Notes (Signed)
STROKE TEAM PROGRESS NOTE   SUBJECTIVE (INTERVAL HISTORY) No family is at the bedside. Pt awake, alert, still has global aphasia, but improved From yesterday. Passes swallow on dysphagia 2 diet. Moving all extremities symmetrically. PT OT recommended CIR.   OBJECTIVE Temp:  [97.7 F (36.5 C)-98.6 F (37 C)] 98.4 F (36.9 C) (09/12 2004) Pulse Rate:  [62-82] 79 (09/12 2004) Cardiac Rhythm: Normal sinus rhythm;Heart block (09/12 1920) Resp:  [13-18] 18 (09/12 2004) BP: (110-141)/(60-75) 125/71 (09/12 2004) SpO2:  [95 %-100 %] 99 % (09/12 2004) Weight:  [148 lb 5.9 oz (67.3 kg)-149 lb 7.6 oz (67.8 kg)] 149 lb 7.6 oz (67.8 kg) (09/12 0734)  CBC:   Recent Labs Lab 09/01/17 1100 09/03/17 0220  WBC 7.3 8.6  NEUTROABS 6.6*  --   HGB 11.4* 11.8*  HCT 33.9* 36.1  MCV 90.0 90.5  PLT 293 280    Basic Metabolic Panel:   Recent Labs Lab 09/01/17 1100 09/03/17 0220  NA 140 138  K 3.9 3.4*  CL 103 105  CO2 26 24  GLUCOSE 196* 102*  BUN 15 11  CREATININE 0.87 0.87  CALCIUM 9.8 9.5    Lipid Panel: No results found for: CHOL, TRIG, HDL, CHOLHDL, VLDL, LDLCALC HgbA1c:  Lab Results  Component Value Date   HGBA1C 6.8 (H) 09/02/2017   Urine Drug Screen:     Component Value Date/Time   LABOPIA NONE DETECTED 09/01/2017 1422   COCAINSCRNUR NONE DETECTED 09/01/2017 1422   LABBENZ NONE DETECTED 09/01/2017 1422   AMPHETMU NONE DETECTED 09/01/2017 1422   THCU NONE DETECTED 09/01/2017 1422   LABBARB NONE DETECTED 09/01/2017 1422    Alcohol Level     Component Value Date/Time   ETH <5 09/01/2017 1100    IMAGING I have personally reviewed the radiological images below and agree with the radiology interpretations.  Ct Head Wo Contrast 09/02/2017 IMPRESSION: 1. Moderately motion degraded examination. 2. Evolving LEFT temporal lobe hematoma, relatively stable. 5 mm LEFT to RIGHT midline shift without ventricular entrapment. 3. Trace LEFT holo hemispheric subdural hematoma versus  motion artifact.   Ct Head Wo Contrast 09/01/2017 IMPRESSION: 1. Large acute left temporal lobe hematoma (approximately 15.9 ml), atypical for hypertensive hemorrhage. Consider hypertensive stroke and amyloid angiopathy. 2. No evidence of acute cervical spine fracture, traumatic subluxation or static signs of instability.  Ct Cervical Spine Wo Contrast 09/01/2017 IMPRESSION: 1. Large acute left temporal lobe hematoma (approximately 15.9 ml), atypical for hypertensive hemorrhage. Consider hypertensive stroke and amyloid angiopathy. 2. No evidence of acute cervical spine fracture, traumatic subluxation or static signs of instability. 3. Critical Value/emergent results were called by telephone at the time of interpretation on 09/01/2017 at 11:45 am to Dr. Jene Every , who verbally acknowledged these results. Electronically Signed   By: Carey Bullocks M.D.   On: 09/01/2017 12:02   TTE  - Left ventricle: The cavity size was normal. Wall thickness was   normal. Systolic function was normal. The estimated ejection   fraction was in the range of 60% to 65%. Wall motion was normal;   there were no regional wall motion abnormalities. Doppler   parameters are consistent with abnormal left ventricular   relaxation (grade 1 diastolic dysfunction). - Atrial septum: There was an atrial septal aneurysm. Impressions: - Normal LV systolic function; mild diastolic dysfunction; atrial   septal aneurysm.  CT repeat  1. Stable size of left temporal lobe parenchymal hematoma and trace left-sided subdural hematoma. Stable mass effect with 5 mm left-to-right  midline shift. 2. No new acute intracranial abnormality.    PHYSICAL EXAM  Temp:  [97.7 F (36.5 C)-98.6 F (37 C)] 98.4 F (36.9 C) (09/12 2004) Pulse Rate:  [62-82] 79 (09/12 2004) Resp:  [13-18] 18 (09/12 2004) BP: (110-141)/(60-75) 125/71 (09/12 2004) SpO2:  [95 %-100 %] 99 % (09/12 2004) Weight:  [148 lb 5.9 oz (67.3 kg)-149 lb 7.6 oz (67.8  kg)] 149 lb 7.6 oz (67.8 kg) (09/12 0734)  General - Well nourished, well developed, in no apparent distress.  Ophthalmologic -  Fundi not visualized due to noncooperation.  Cardiovascular - Regular rate and rhythm.  Neuro - awake, alert, interactive, however, not following commands and word salad, improved from yesterday. Significant perseveration. Eyes left gaze preference, but able to cross midline, right partial neglect. Not blinking to visual threat on the right. No significant facial asymmetry. tongue midline in mouth. Moving all extremities symmetrically. DTR 1+ and babinski not cooperative. Sensation, coordination and gait not tested.   ASSESSMENT/PLAN Yvette Collier is a 76 y.o. female with history of COPD, DM, GIB, HTN, CAD, PAF not on AC due to GIB followed by Dr. Mariah MillingGollan presenting with AMS and likely fall at home. She did not receive IV t-PA due to ICH.   ICH:  left temporal moderate ICH and left frontal trace SDH, concerning for traumatic ICH due to fall. However, needs to rule out CAA, tumor or CVT (vein of labbe)   Resultant  Global aphasia  CT head left temporal ICH  CT repeat stable ICH  MRI / MRA / MRV will consider once pt more cooperative  Carotid Doppler  Not indicated  2D Echo  EF 60-65%  LDL not ordered  HgbA1c 6.8  SCDs for VTE prophylaxis DIET DYS 2 Room service appropriate? Yes; Fluid consistency: Thin  aspirin 81 mg daily prior to admission, now on No antithrombotic  Ongoing aggressive stroke risk factor management  Therapy recommendations:  CIR  Disposition:  Pending  PAF  Follows with Dr. Mariah MillingGollan  On metoprolol for rate control  12/2016 cardiac monitoring showed no afib  Not on AC due to hx of GIB  Hypertension  Stable  Off cardene  Resume home meds - cozaar and metoprolol  Long-term BP goal < 160  Diabetes  HgbA1c 6.8, goal < 7.0  Controlled  SSI  Other Stroke Risk Factors  Advanced age  Coronary artery  disease  Hx of TIA  Other Active Problems  Hx of GIB  COPD  Confusion - soft restrain  Hospital day # 2  Marvel PlanJindong Tristina Sahagian, MD PhD Stroke Neurology 09/03/2017 9:18 PM  To contact Stroke Continuity provider, please refer to [email or web address]. After hours, contact General Neurology

## 2017-09-03 NOTE — Progress Notes (Signed)
  Speech Language Pathology Treatment: Dysphagia;Cognitive-Linquistic  Patient Details Name: Yvette Collier MRN: [id number] DOB: August 28, 1941 Today's Date: 09/03/2017 Time: [id number] SLP Time Calculation (min) (ACUTE ONLY): 31 min  Assessment / Plan / Recommendation Clinical Impression  Significant bilateral pocketing without awareness with eggs and minced sausage removed by SLP. Grits offered increased cohesion and ability to masticate and propel with greater success. No indications of pharyngeal residue or decreased airway protection. Downgrade texture to Dys 2, continue thin, crush meds and full supervision. Needs initial assist for correct use of utensils due to apraxia.   Independently stated "Yvette Collier" when asked her name. Majority of spontaneous verbalizations were jargon with several accurate words mixed in. Sang familiar song in unison with SLP with 70% accuracy for words. Significant difficulty comprehending simple directions requiring visual cues in addition. Ideomotor apraxia with utensils initially max tactile cues fading to mild. Continue ST.     HPI HPI: Ptis a 76 y.o.femalewith medical history of hypertension, diabetes mellitus, COPD and aphasic history. [facility] being diagnosed with a left temporal hemorrhage [facility] after presenting with altered mental status on 9/10. CT shows evolving left temporal lobe hematoma, relatively stable, 5 mm left to right midline shift without ventricular entrapment and trace left holo hemispheric subdural hematoma versus motion artifact.      SLP Plan  Continue with current plan of care       Recommendations  Diet recommendations: Dysphagia 2 (fine chop);Thin liquid Liquids provided via: Cup;Straw Medication Administration: Crushed with puree Supervision: Patient able to self feed;Full supervision/cueing for compensatory strategies Compensations: Slow rate;Small sips/bites;Lingual sweep  for clearance of pocketing Postural Changes and/or Swallow Maneuvers: Seated upright 90 degrees                Oral Care Recommendations: Oral care BID Follow up Recommendations: Inpatient Rehab SLP Visit Diagnosis: Dysphagia, oral phase (R13.11) Plan: Continue with current plan of care       GO                Yvette Collier, Yvette Collier 09/03/2017, 9:52 AM  Yvette Collier Yvette Collier

## 2017-09-03 NOTE — Progress Notes (Signed)
I contacted pt's daughter, Rutha BouchardDevona, by phone. She is also POA. We discussed an inpt rehab admission pending caregiver support available and insurance approval. She does not want SNF rehab, prefers CIR. She will discuss with family and arrange 24/7 assist. I will begin insurance pre cert for a possible admit tomorrow. [phone]

## 2017-09-03 NOTE — Evaluation (Addendum)
Occupational Therapy Evaluation Patient Details Name: Yvette Collier MRN: [id number] DOB: [birth year] Today's Date: 09/03/2017    History of Present Illness Ptis a 56 y/ofemale with aPMH significant for HTN, DM, COPD transferred to [facility] after being diagnosed with left temporal hemorrhage at [facility] after presenting with altered mental status earlier in the morning.   Clinical Impression   PTA, pt was independent with ADL and functional mobility. Pt pleasant and eager to work with therapies. She currently presents with significant communication deficits, difficulty following commands with multimodal cues, decreased ideational and ideomotor praxis, perseveration with all ADL tasks, as well as decreased attention. She requires overall min-mod assist for ADL participation due to the above stated deficits. She was able to report to therapist that she needed to urinate and complete toilet transfers with min assist +2. Pt would benefit from continued OT services while admitted to improve independence with ADL. Pt was highly independent prior to admission and would greatly benefit from the intensity of CIR level therapies to maximize return to independence prior to D/C home with assistance from family.     Follow Up Recommendations  CIR;Supervision/Assistance - 24 hour    Equipment Recommendations  Other (comment) (Defer to next venue of care)    Recommendations for Other Services Rehab consult     Precautions / Restrictions Precautions Precautions: Fall Restrictions Weight Bearing Restrictions: No      Mobility Bed Mobility Overal bed mobility: Needs Assistance Bed Mobility: Supine to Sit     Supine to sit: Min assist     General bed mobility comments: Assist for initiation of LE to EOB.   Transfers Overall transfer level: Needs assistance Equipment used: 2 person hand held assist Transfers: Sit to/from [employer] Sit to Stand: +2 physical  assistance;Min assist Stand pivot transfers: +2 physical assistance;Min assist       General transfer comment: Min assist +2 (2 person handheld assist) for stability in standing.     Balance Overall balance assessment: Needs assistance Sitting-balance support: Feet supported;No upper extremity supported Sitting balance-Leahy Scale: Fair Sitting balance - Comments: Able to statically sit with supervision.    Standing balance support: Single extremity supported Standing balance-Leahy Scale: Poor Standing balance comment: Min assist to maintain stability during dynamic and static standing tasks.                            ADL either performed or assessed with clinical judgement   ADL Overall ADL's : Needs assistance/impaired Eating/Feeding: Minimal assistance;Sitting Eating/Feeding Details (indicate cue type and reason): Min assist to initiate and complete tasks. Perseveration limiting pt.  Grooming: Minimal assistance;Standing Grooming Details (indicate cue type and reason): Initially min +2 assist for standing balance but then able to stand with min assist +1. Pt requiring verbal, visual, and tactile cues to locate soap on wall and perseverating on tasks. When asked to wash hands, pt attempting to use soap on perineal area.  Upper Body Bathing: Sitting;Moderate assistance   Lower Body Bathing: Sit to/from stand;Moderate assistance   Upper Body Dressing : Sitting;Moderate assistance   Lower Body Dressing: Sit to/from stand;Moderate assistance Lower Body Dressing Details (indicate cue type and reason): Min assist for balance and multimodal cues to complete task.  Toilet Transfer: Minimal assistance;+2 for physical assistance;BSC   Toileting- Clothing Manipulation and Hygiene: Minimal assistance;Sit to/from stand Toileting - Clothing Manipulation Details (indicate cue type and reason): for balance; multimodal cues to initiate task  Functional mobility during ADLs:  Minimal assistance;+2 for physical assistance General ADL Comments: Pt with significant communication deficits but able to intermittently follow commands with multimodal cues. Limited by praxis deficits; motor functions in tact for tasks but decreased cognition and praxis causing need for increased assistance.      Vision   Additional Comments: Pt able to cross midline gaze and locate items in bilateral visual fields. Difficult to fully assess due to communication defitics.      Perception     Praxis Praxis Praxis tested?: Deficits Deficits: Initiation;Ideation;Ideomotor;Perseveration;Organization Praxis-Other Comments: Pt with difficulty planning movement as well as utilizing tools. Once hand over hand assist provided to initiate task such as brushing his teeth, she is able to continue with task. However, requiring tactile cues and assistance to terminate tasks.     Pertinent Vitals/Pain Pain Assessment: Faces Faces Pain Scale: No hurt     Hand Dominance Right   Extremity/Trunk Assessment Upper Extremity Assessment Upper Extremity Assessment: Difficult to assess due to impaired cognition (strength WFL for functional tasks)   Lower Extremity Assessment Lower Extremity Assessment: Defer to PT evaluation       Communication Communication Communication: Receptive difficulties;Expressive difficulties   Cognition Arousal/Alertness: Awake/alert Behavior During Therapy: WFL for tasks assessed/performed Overall Cognitive Status: Difficult to assess Area of Impairment: Orientation;Attention;Memory;Following commands;Safety/judgement;Awareness;Problem solving                 Orientation Level: Disoriented to;Person;Place;Time;Situation Current Attention Level: Sustained Memory: Decreased short-term memory Following Commands: Follows one step commands inconsistently;Follows one step commands with increased time Safety/Judgement: Decreased awareness of safety;Decreased awareness  of deficits Awareness: Intellectual Problem Solving: Slow processing;Decreased initiation;Difficulty sequencing;Requires verbal cues;Requires tactile cues General Comments: Significant praxis difficulties as detailed below. Pt improves in ability to follow commands with hand over hand assist for initiation and visual cues. Difficult to fully assess cognition due to communication difficulties.    General Comments  HR up to 126 during standing activity; SpO2 probe with difficulty reading following hand washing task and unable to fit back on finger. RN present and aware.     Exercises     Shoulder Instructions      Home Living Family/patient expects to be discharged to:: Private residence Living Arrangements: Alone Available Help at Discharge: Family;Available PRN/intermittently Type of Home: House Home Access: Level entry     Home Layout: One level     Bathroom Shower/Tub: Chief Strategy OfficerTub/shower unit   Bathroom Toilet: Handicapped height Bathroom Accessibility: Yes   Home Equipment: Cane - single point;Bedside commode          Prior Functioning/Environment Level of Independence: Independent        Comments: driving, independent with everything        OT Problem List: Impaired balance (sitting and/or standing);Decreased safety awareness;Decreased cognition;Decreased coordination;Decreased knowledge of precautions;Impaired UE functional use      OT Treatment/Interventions: Self-care/ADL training;Therapeutic exercise;Energy conservation;DME and/or AE instruction;Therapeutic activities;Patient/family education;Balance training;Cognitive remediation/compensation    OT Goals(Current goals can be found in the care plan section) Acute Rehab OT Goals Patient Stated Goal: Pt did not state goals, and family's goal is to regain as much independence as possible.  OT Goal Formulation: Patient unable to participate in goal setting Time For Goal Achievement: 09/17/17 Potential to Achieve Goals:  Good ADL Goals Pt Will Perform Grooming: with supervision;sitting (multimodal cues) Pt Will Transfer to Toilet: with supervision;ambulating;bedside commode (BSC over toilet) Pt Will Perform Toileting - Clothing Manipulation and hygiene: with supervision;sit to/from stand Additional ADL Goal #  1: Pt will demonstrate improved ideational praxis skills to appropriately utilize toothbrush for oral care with no more than 1 visual cue.  Additional ADL Goal #2: Pt will demonstrate selective attention during standing grooming tasks.  Additional ADL Goal #3: Pt will complete hand washing task with no more than 1 visual cue to prevent perseveration.   OT Frequency: Min 2X/week   Barriers to D/C:            Co-evaluation PT/OT/SLP Co-Evaluation/Treatment: Yes Reason for Co-Treatment: For patient/therapist safety   OT goals addressed during session: ADL's and self-care      AM-PAC PT "6 Clicks" Daily Activity     Outcome Measure Help from another person eating meals?: A Little Help from another person taking care of personal grooming?: A Lot Help from another person toileting, which includes using toliet, bedpan, or urinal?: A Little Help from another person bathing (including washing, rinsing, drying)?: A Lot Help from another person to put on and taking off regular upper body clothing?: A Lot Help from another person to put on and taking off regular lower body clothing?: A Lot 6 Click Score: 14   End of Session Equipment Utilized During Treatment: Gait belt Nurse Communication: Mobility status  Activity Tolerance: Patient tolerated treatment well Patient left: in chair;with call bell/phone within reach  OT Visit Diagnosis: Unsteadiness on feet (R26.81);Apraxia (R48.2)                Time: 1610-9604 OT Time Calculation (min): 34 min Charges:  OT General Charges $OT Visit: 1 Visit OT Evaluation $OT Eval Moderate Complexity: 1 Mod G-Codes:     Doristine Section, MS OTR/L  Pager:  [phone]   Trianna Lupien A Shakeia Krus 09/03/2017, 10:43 AM

## 2017-09-03 NOTE — PMR Pre-admission (Signed)
PMR Admission Coordinator Pre-Admission Assessment  Patient: Yvette Collier is an 76 y.o., female MRN: [id number] DOB: Feb 13, 1941 Height:  (165.1 cm) Weight: 67.8 kg (149 lb 7.6 oz)              Insurance Information HMO: yes    PPO:      PCP:      IPA:      80/20:      OTHER: Medicare advantage plan PRIMARY: [employer] Medicare      Policy#: [id number]      Subscriber: pt CM Name: Kathrynn Ducking      Phone#: [phone]     Fax#: [phone] Pre-Cert#: [id number] approved for 7 days with f/u Rebeca Alert phone [phone] fax [phone]      Employer:  Benefits:  Phone #: [phone]     Name: 09/03/17 Eff. Date: 07/23/2017     Deduct: none      Out of Pocket Max: [phone]      Life Max: none CIR: no co pay days 1- 90 days      SNF: no co pay days 1-20: $167.50 co pay days 21-100 Outpatient: no co pay per visit     Co-Pay: visits per medical neccesity Home Health: 100%      Co-Pay: visits per medical neccesity DME: 80%     Co-Pay: 20% Providers: in network  SECONDARY: Medicaid      Policy#: [id number]      Subscriber: pt  Medicaid Application Date:       Case Manager:  Disability Application Date:       Case Worker:   Emergency Contact Information Contact Information    Name Relation Home Work Mobile   Reid,Devona Daughter [phone]  [phone]   Mila Homer Daughter [phone]  [phone]   Marcellas,Gattis Son [phone]       Current Medical History  Patient Admitting Diagnosis:  Left temporal lobe hemorrhage  History of Present Illness: : VELERA LANSDALE a 76 y.o.right handed femalewith history of hypertension, diabetes mellitus, COPD.  Admitted 09/01/2017 with altered mental status right side weakness as well as aphasia. Blood pressure elevated 175 systolic and was started on nicardipine drip. CT of the head showed a large acute left temporal lobe hematoma approximately 15.9 mL suspect hypertensive hemorrhage. CT cervical spine negative. Neurology  follow-up with conservative care monitoring of blood pressure. Echocardiogram with ejection fraction 65% grade 1 diastolic dysfunction. Follow-up CT of head 09/02/2017 stable noted 5 mm left-to-right midline shift without ventricular entrapment and again repeated 09/03/2017 showing stable size of left temporal lobe parenchymal hematoma stable mass effect and no new acute intracranial abnormalities. Mechanical soft diet. Dysphagia 2 diet with thin liquids.    Total: 4 NIHSS    Past Medical History  Past Medical History:  Diagnosis Date  . Asthma   . Chronic lower back pain   . COPD (chronic obstructive pulmonary disease) (HCC)   . Diabetes mellitus without complication (HCC)   . Gastrointestinal bleed   . GERD (gastroesophageal reflux disease)   . History of colon polyps   . Hypertension   . Hypertensive heart disease   . IBS (irritable bowel syndrome)   . Ischemic colitis (HCC)   . Non-obstructive Coronary Artery Disease    a. 05/2009 Cath [facility]): minimal nonobs dzs.  . Osteoarthritis   . PAF (paroxysmal atrial fibrillation) (HCC)    a. 09/2014 during GI illness;  b. CHA2DS2VASc = 5 (not currently on OAC 2/2 h/o GIB/ischemic colitis);  c. 09/2014 Echo: EF 60-65%, imparied relaxation, nl RV size/fxn.  . Transient cerebral ischemia due to atrial fibrillation (HCC)     Family History  family history includes Heart attack in her mother; Heart disease in her father; Hypertension in her brother, father, maternal aunt, and mother.  Prior Rehab/Hospitalizations:  Has the patient had major surgery during 100 days prior to admission? No  Current Medications   Current Facility-Administered Medications:  .  hydrALAZINE (APRESOLINE) injection 10 mg, 10 mg, Intravenous, Q2H PRN, Marvel PlanXu, Jindong, MD .  Influenza vac split quadrivalent PF (FLUZONE HIGH-DOSE) injection 0.5 mL, 0.5 mL, Intramuscular, Tomorrow-1000, Xu, Jindong, MD .  insulin aspart (novoLOG) injection 0-15 Units, 0-15 Units,  Subcutaneous, TID WC, Aroor, Dara LordsSushanth R, MD, 2 Units at 09/04/17 0912 .  insulin aspart (novoLOG) injection 0-5 Units, 0-5 Units, Subcutaneous, QHS, Aroor, Georgiana SpinnerSushanth R, MD .  losartan (COZAAR) tablet 25 mg, 25 mg, Oral, Daily, Marvel PlanXu, Jindong, MD, 25 mg at 09/04/17 0913 .  MEDLINE mouth rinse, 15 mL, Mouth Rinse, BID, Marvel PlanXu, Jindong, MD, 15 mL at 09/03/17 2151 .  metoprolol tartrate (LOPRESSOR) tablet 12.5 mg, 12.5 mg, Oral, BID, Marvel PlanXu, Jindong, MD, 12.5 mg at 09/04/17 0913 .  pneumococcal 23 valent vaccine (PNU-IMMUNE) injection 0.5 mL, 0.5 mL, Intramuscular, Tomorrow-1000, Marvel PlanXu, Jindong, MD .  pregabalin (LYRICA) capsule 25 mg, 25 mg, Oral, BID, Marvel PlanXu, Jindong, MD, 25 mg at 09/04/17 0913 .  simvastatin (ZOCOR) tablet 10 mg, 10 mg, Oral, QPM, Marvel PlanXu, Jindong, MD, 10 mg at 09/03/17 1751  Patients Current Diet: DIET DYS 2 Room service appropriate? Yes; Fluid consistency: Thin check for pocketing  Precautions / Restrictions Precautions Precautions: Fall Precaution Comments: Posey Belt Restrictions Weight Bearing Restrictions: No   Has the patient had 2 or more falls or a fall with injury in the past year?No  Prior Activity Level Community (5-7x/wk): Independent and driving pta; Renato Gailsastor; family asissted with house work; lived alone  Journalist, newspaperHome Assistive Devices / Corporate investment bankerquipment Home Assistive Devices/Equipment: None Home Equipment: Cane - single point, Bedside commode  Prior Device Use: Indicate devices/aids used by the patient prior to current illness, exacerbation or injury? None of the above  Prior Functional Level Prior Function Level of Independence: Independent Comments: driving, independent with everything  Self Care: Did the patient need help bathing, dressing, using the toilet or eating?  Independent  Indoor Mobility: Did the patient need assistance with walking from room to room (with or without device)? Independent  Stairs: Did the patient need assistance with internal or external stairs (with or  without device)? Independent  Functional Cognition: Did the patient need help planning regular tasks such as shopping or remembering to take medications? Independent  Current Functional Level Cognition  Arousal/Alertness: Awake/alert Overall Cognitive Status: Difficult to assess Difficult to assess due to: Impaired communication Current Attention Level: Sustained Orientation Level: Oriented to person, Oriented to place, Disoriented to time, Disoriented to situation Following Commands: Follows one step commands inconsistently, Follows one step commands with increased time Safety/Judgement: Decreased awareness of safety, Decreased awareness of deficits General Comments: Significant praxis difficulties as detailed below. Pt improves in ability to follow commands with hand over hand assist for initiation and visual cues. Difficult to fully assess cognition due to communication difficulties.  Attention: Sustained Sustained Attention: Impaired Sustained Attention Impairment: Verbal complex, Functional complex Awareness: Impaired Awareness Impairment: Intellectual impairment Problem Solving: Impaired Problem Solving Impairment: Functional basic, Verbal basic Behaviors: Restless Safety/Judgment: Impaired    Extremity Assessment (includes Sensation/Coordination)  Upper Extremity Assessment: Difficult to  assess due to impaired cognition (strength WFL for functional tasks)  Lower Extremity Assessment: Defer to PT evaluation    ADLs  Overall ADL's : Needs assistance/impaired Eating/Feeding: Minimal assistance, Sitting Eating/Feeding Details (indicate cue type and reason): Min assist to initiate and complete tasks. Perseveration limiting pt.  Grooming: Minimal assistance, Standing Grooming Details (indicate cue type and reason): Initially min +2 assist for standing balance but then able to stand with min assist +1. Pt requiring verbal, visual, and tactile cues to locate soap on wall and  perseverating on tasks. When asked to wash hands, pt attempting to use soap on perineal area.  Upper Body Bathing: Sitting, Moderate assistance Lower Body Bathing: Sit to/from stand, Moderate assistance Upper Body Dressing : Sitting, Moderate assistance Lower Body Dressing: Sit to/from stand, Moderate assistance Lower Body Dressing Details (indicate cue type and reason): Min assist for balance and multimodal cues to complete task.  Toilet Transfer: Minimal assistance, +2 for physical assistance, BSC Toileting- Clothing Manipulation and Hygiene: Minimal assistance, Sit to/from stand Toileting - Clothing Manipulation Details (indicate cue type and reason): for balance; multimodal cues to initiate task Functional mobility during ADLs: Minimal assistance, +2 for physical assistance General ADL Comments: Pt with significant communication deficits but able to intermittently follow commands with multimodal cues. Limited by praxis deficits; motor functions in tact for tasks but decreased cognition and praxis causing need for increased assistance.     Mobility  Overal bed mobility: Needs Assistance Bed Mobility: Supine to Sit Supine to sit: Min assist General bed mobility comments: multimodal cues for initiation and follow through of task    Transfers  Overall transfer level: Needs assistance Equipment used: 2 person hand held assist Transfers: Sit to/from Stand, Stand Pivot Transfers Sit to Stand: +2 physical assistance, Min assist Stand pivot transfers: +2 physical assistance, Min assist General transfer comment: pt was able to communicate that she needs to urinate when sitting EOB; Min assist +2 (2 person handheld assist) for stability in standing.     Ambulation / Gait / Stairs / Wheelchair Mobility  Ambulation/Gait Ambulation/Gait assistance: Min assist, +2 safety/equipment Ambulation Distance (Feet): 6 Feet Assistive device: 2 person hand held assist Gait Pattern/deviations: Step-through  pattern, Decreased stride length, Trunk flexed General Gait Details: pt with guarded movements but grossly steady gait; pt required directional cues both visual and tactile  Gait velocity: Decreased Gait velocity interpretation: Below normal speed for age/gender    Posture / Balance Dynamic Sitting Balance Sitting balance - Comments: Able to statically sit with supervision.  Balance Overall balance assessment: Needs assistance Sitting-balance support: Feet supported, No upper extremity supported Sitting balance-Leahy Scale: Fair Sitting balance - Comments: Able to statically sit with supervision.  Postural control: Posterior lean Standing balance support: Single extremity supported Standing balance-Leahy Scale: Poor Standing balance comment: Min assist to maintain stability during dynamic and static standing tasks.     Special needs/care consideration BiPAP/CPAP  N/a CPM  N/a Continuous Drip IV yes at 75 cc/hr due to increase in creat per Dr. [facility]  N/a Life Vest  N/a Oxygen  N/a Special Bed  N/a Trach Size  N/A Wound Vac N/A Skin intact                           Bowel mgmt: continent LBM 09/03/17 Bladder mgmt: some incontinence Diabetic mgmt Hgb A1c 6.8   Previous Home Environment Living Arrangements: Alone  Lives With: Alone Available Help at Discharge: Available 24  hours/day, Family Type of Home: House Home Layout: One level Home Access: Level entry Bathroom Shower/Tub: Tub/shower unit, Engineer, building services: Handicapped height Bathroom Accessibility: Yes Home Care Services: No  Discharge Living Setting Plans for Discharge Living Setting: Patient's home, Alone Type of Home at Discharge: House Discharge Home Layout: One level Discharge Home Access: Level entry Discharge Bathroom Shower/Tub: Tub/shower unit, Curtain Discharge Bathroom Toilet: Handicapped height Discharge Bathroom Accessibility: Yes How Accessible: Accessible via walker Does the patient have  any problems obtaining your medications?: No  Social/Family/Support Systems Patient Roles: Parent, Other (Comment) (pastor) Contact Information: Devona, daughter is main contact and POA Anticipated Caregiver: daughter, daughter in law Anticipated Caregiver's Contact Information: see above Ability/Limitations of Caregiver: Devona works nights, daughter in Social worker and other dauhgter will help do 24/7 care Caregiver Availability: 24/7 Discharge Plan Discussed with Primary Caregiver: Yes Is Caregiver In Agreement with Plan?: Yes Does Caregiver/Family have Issues with Lodging/Transportation while Pt is in Rehab?: No  Goals/Additional Needs Patient/Family Goal for Rehab: supervision to min assist with PT, OT, and SLP Expected length of stay: ELOS 12-18 days Pt/Family Agrees to Admission and willing to participate: Yes Program Orientation Provided & Reviewed with Pt/Caregiver Including Roles  & Responsibilities: Yes  Decrease burden of Care through IP rehab admission: N/A  Possible need for SNF placement upon discharge:Not anticipated  Patient Condition: This patient's condition remains as documented in the consult dated 09/02/2017, in which the Rehabilitation Physician determined and documented that the patient's condition is appropriate for intensive rehabilitative care in an inpatient rehabilitation facility. Will admit to inpatient rehab today.  Preadmission Screen Completed By:  Clois Dupes, 09/04/2017 10:23 AM ______________________________________________________________________   Discussed status with Dr. Wynn Banker on 09/04/2017 at  1023 and received telephone approval for admission today.  Admission Coordinator:  Clois Dupes, time 0454 Date 09/04/2017

## 2017-09-03 NOTE — Care Management Note (Signed)
Case Management Note  Patient Details  Name: Tomma RakersMary L Gebert MRN: [id number] Date of Birth: 1941/02/23  Subjective/Objective:  Pt admitted on 09/01/17 s/p LT temporal hemorrhage with AMS.  PTA, pt independent, lives alone at home.                   Action/Plan: PT/OT/ST recommending CIR, and consult has been ordered.  Will follow progress.    Expected Discharge Date:                  Expected Discharge Plan:  IP Rehab Facility  In-House Referral:     Discharge planning Services  CM Consult  Post Acute Care Choice:    Choice offered to:     DME Arranged:    DME Agency:     HH Arranged:    HH Agency:     Status of Service:  In process, will continue to follow  If discussed at Long Length of Stay Meetings, dates discussed:    Additional Comments:  Glennon Macmerson, Laney Bagshaw M, RN 09/03/2017, 11:47 AM

## 2017-09-03 NOTE — Plan of Care (Signed)
Problem: Education: Goal: Knowledge of patient specific risk factors addressed and post discharge goals established will improve Outcome: Not Progressing Unsure if patient is retaining information, unable to assess  Problem: Self-Care: Goal: Ability to participate in self-care as condition permits will improve Outcome: Progressing Following commands, not verbal, but by example  Problem: Physical Regulation: Goal: Ability to maintain clinical measurements within normal limits will improve Outcome: Progressing BP improved  Comments: Patient unable to verbalize if comprehending or not, will follow commands only when prompted with example ( ie, nurse lifts own arm up then lifts patients arm up she holds it up on her own. Unsure if understands the sequence of events or if cannot express understanding

## 2017-09-03 NOTE — Progress Notes (Signed)
Transferred from ICU. Assessment as noted. Taken off safety belt restraint, patient smiling and nodding to questions, cannot express verbally appropriate to the question, but speaks in clear voice. Delayed in following command, does follow commands more to demonstration versus verbal command.

## 2017-09-04 ENCOUNTER — Inpatient Hospital Stay (HOSPITAL_COMMUNITY): Payer: Medicare Other

## 2017-09-04 ENCOUNTER — Inpatient Hospital Stay (HOSPITAL_COMMUNITY)
Admission: RE | Admit: 2017-09-04 | Discharge: 2017-09-12 | DRG: 057 | Disposition: A | Discharge status: Home / Self Care | Payer: Medicare Other | Source: Intra-hospital | Attending: Physical Medicine & Rehabilitation | Admitting: Physical Medicine & Rehabilitation

## 2017-09-04 ENCOUNTER — Encounter (HOSPITAL_COMMUNITY): Payer: Self-pay | Admitting: Nurse Practitioner

## 2017-09-04 DIAGNOSIS — I69120 Aphasia following nontraumatic intracerebral hemorrhage: Secondary | ICD-10-CM

## 2017-09-04 DIAGNOSIS — Z9049 Acquired absence of other specified parts of digestive tract: Secondary | ICD-10-CM

## 2017-09-04 DIAGNOSIS — Z8249 Family history of ischemic heart disease and other diseases of the circulatory system: Secondary | ICD-10-CM | POA: Diagnosis not present

## 2017-09-04 DIAGNOSIS — Z888 Allergy status to other drugs, medicaments and biological substances status: Secondary | ICD-10-CM | POA: Diagnosis not present

## 2017-09-04 DIAGNOSIS — K559 Vascular disorder of intestine, unspecified: Secondary | ICD-10-CM | POA: Diagnosis present

## 2017-09-04 DIAGNOSIS — I169 Hypertensive crisis, unspecified: Secondary | ICD-10-CM | POA: Diagnosis present

## 2017-09-04 DIAGNOSIS — J449 Chronic obstructive pulmonary disease, unspecified: Secondary | ICD-10-CM | POA: Diagnosis present

## 2017-09-04 DIAGNOSIS — F802 Mixed receptive-expressive language disorder: Secondary | ICD-10-CM | POA: Diagnosis present

## 2017-09-04 DIAGNOSIS — I6200 Nontraumatic subdural hemorrhage, unspecified: Secondary | ICD-10-CM

## 2017-09-04 DIAGNOSIS — I69151 Hemiplegia and hemiparesis following nontraumatic intracerebral hemorrhage affecting right dominant side: Principal | ICD-10-CM

## 2017-09-04 DIAGNOSIS — K589 Irritable bowel syndrome without diarrhea: Secondary | ICD-10-CM | POA: Diagnosis present

## 2017-09-04 DIAGNOSIS — Z7982 Long term (current) use of aspirin: Secondary | ICD-10-CM

## 2017-09-04 DIAGNOSIS — N179 Acute kidney failure, unspecified: Secondary | ICD-10-CM | POA: Diagnosis present

## 2017-09-04 DIAGNOSIS — Z883 Allergy status to other anti-infective agents status: Secondary | ICD-10-CM

## 2017-09-04 DIAGNOSIS — I69398 Other sequelae of cerebral infarction: Secondary | ICD-10-CM | POA: Diagnosis not present

## 2017-09-04 DIAGNOSIS — I69320 Aphasia following cerebral infarction: Secondary | ICD-10-CM | POA: Diagnosis not present

## 2017-09-04 DIAGNOSIS — R4702 Dysphasia: Secondary | ICD-10-CM

## 2017-09-04 DIAGNOSIS — I480 Paroxysmal atrial fibrillation: Secondary | ICD-10-CM | POA: Diagnosis present

## 2017-09-04 DIAGNOSIS — K219 Gastro-esophageal reflux disease without esophagitis: Secondary | ICD-10-CM | POA: Diagnosis present

## 2017-09-04 DIAGNOSIS — I119 Hypertensive heart disease without heart failure: Secondary | ICD-10-CM | POA: Diagnosis present

## 2017-09-04 DIAGNOSIS — E785 Hyperlipidemia, unspecified: Secondary | ICD-10-CM | POA: Diagnosis present

## 2017-09-04 DIAGNOSIS — I611 Nontraumatic intracerebral hemorrhage in hemisphere, cortical: Secondary | ICD-10-CM | POA: Diagnosis present

## 2017-09-04 DIAGNOSIS — R269 Unspecified abnormalities of gait and mobility: Secondary | ICD-10-CM

## 2017-09-04 DIAGNOSIS — E1142 Type 2 diabetes mellitus with diabetic polyneuropathy: Secondary | ICD-10-CM | POA: Diagnosis present

## 2017-09-04 DIAGNOSIS — R509 Fever, unspecified: Secondary | ICD-10-CM

## 2017-09-04 DIAGNOSIS — Z79899 Other long term (current) drug therapy: Secondary | ICD-10-CM

## 2017-09-04 DIAGNOSIS — Z90710 Acquired absence of both cervix and uterus: Secondary | ICD-10-CM | POA: Diagnosis not present

## 2017-09-04 DIAGNOSIS — R590 Localized enlarged lymph nodes: Secondary | ICD-10-CM | POA: Diagnosis not present

## 2017-09-04 DIAGNOSIS — I2510 Atherosclerotic heart disease of native coronary artery without angina pectoris: Secondary | ICD-10-CM | POA: Diagnosis present

## 2017-09-04 DIAGNOSIS — Z96642 Presence of left artificial hip joint: Secondary | ICD-10-CM | POA: Diagnosis present

## 2017-09-04 DIAGNOSIS — G8929 Other chronic pain: Secondary | ICD-10-CM | POA: Diagnosis present

## 2017-09-04 DIAGNOSIS — Z8673 Personal history of transient ischemic attack (TIA), and cerebral infarction without residual deficits: Secondary | ICD-10-CM | POA: Diagnosis not present

## 2017-09-04 DIAGNOSIS — Z91041 Radiographic dye allergy status: Secondary | ICD-10-CM | POA: Diagnosis not present

## 2017-09-04 LAB — CBC
HEMATOCRIT: 32 % — AB (ref 36.0–46.0)
HEMOGLOBIN: 10.5 g/dL — AB (ref 12.0–15.0)
MCH: 29.7 pg (ref 26.0–34.0)
MCHC: 32.8 g/dL (ref 30.0–36.0)
MCV: 90.4 fL (ref 78.0–100.0)
Platelets: 251 10*3/uL (ref 150–400)
RBC: 3.54 MIL/uL — AB (ref 3.87–5.11)
RDW: 12.7 % (ref 11.5–15.5)
WBC: 5.8 10*3/uL (ref 4.0–10.5)

## 2017-09-04 LAB — BASIC METABOLIC PANEL
ANION GAP: 7 (ref 5–15)
BUN: 19 mg/dL (ref 6–20)
CHLORIDE: 104 mmol/L (ref 101–111)
CO2: 27 mmol/L (ref 22–32)
CREATININE: 1.19 mg/dL — AB (ref 0.44–1.00)
Calcium: 9.1 mg/dL (ref 8.9–10.3)
GFR calc non Af Amer: 43 mL/min — ABNORMAL LOW (ref >60)
GFR, EST AFRICAN AMERICAN: 49 mL/min — AB (ref >60)
Glucose, Bld: 120 mg/dL — ABNORMAL HIGH (ref 65–99)
POTASSIUM: 3.7 mmol/L (ref 3.5–5.1)
SODIUM: 138 mmol/L (ref 135–145)

## 2017-09-04 LAB — GLUCOSE, CAPILLARY
Glucose-Capillary: 124 mg/dL — ABNORMAL HIGH (ref 65–99)
Glucose-Capillary: 152 mg/dL — ABNORMAL HIGH (ref 65–99)
Glucose-Capillary: 126 mg/dL — ABNORMAL HIGH (ref 65–99)
Glucose-Capillary: 75 mg/dL (ref 65–99)

## 2017-09-04 MED ORDER — ONDANSETRON HCL 4 MG/2ML IJ SOLN: 4.0000 mg | Freq: Four times a day (QID) | INTRAMUSCULAR | Status: DC | PRN

## 2017-09-04 MED ORDER — INSULIN ASPART 100 UNIT/ML ~~LOC~~ SOLN
0.0000 [IU] | Freq: Three times a day (TID) | SUBCUTANEOUS | Status: DC
  Administered 2017-09-04 – 2017-09-05 (×3): 2 [IU] via SUBCUTANEOUS
  Administered 2017-09-06: 8 [IU] via SUBCUTANEOUS
  Administered 2017-09-06: 2 [IU] via SUBCUTANEOUS
  Administered 2017-09-07: 3 [IU] via SUBCUTANEOUS
  Administered 2017-09-07: 2 [IU] via SUBCUTANEOUS
  Administered 2017-09-08: 3 [IU] via SUBCUTANEOUS
  Administered 2017-09-08: 2 [IU] via SUBCUTANEOUS
  Administered 2017-09-09 (×2): 3 [IU] via SUBCUTANEOUS
  Administered 2017-09-10 – 2017-09-11 (×2): 5 [IU] via SUBCUTANEOUS
  Administered 2017-09-12: 2 [IU] via SUBCUTANEOUS
  Administered 2017-09-12: 5 [IU] via SUBCUTANEOUS

## 2017-09-04 MED ORDER — ONDANSETRON HCL 4 MG PO TABS
4.0000 mg | ORAL_TABLET | Freq: Four times a day (QID) | ORAL | Status: DC | PRN
  Administered 2017-09-05: 4 mg via ORAL
  Filled 2017-09-04: qty 1

## 2017-09-04 MED ORDER — SORBITOL 70 % SOLN: 30.0000 mL | Freq: Every day | Status: DC | PRN

## 2017-09-04 MED ORDER — METOPROLOL TARTRATE 12.5 MG HALF TABLET
12.5000 mg | ORAL_TABLET | Freq: Two times a day (BID) | ORAL | Status: DC
  Administered 2017-09-04 – 2017-09-12 (×16): 12.5 mg via ORAL
  Filled 2017-09-04 (×16): qty 1

## 2017-09-04 MED ORDER — SIMVASTATIN 5 MG PO TABS
10.0000 mg | ORAL_TABLET | Freq: Every evening | ORAL | Status: DC
  Administered 2017-09-04 – 2017-09-11 (×8): 10 mg via ORAL
  Filled 2017-09-04 (×8): qty 2

## 2017-09-04 MED ORDER — LOSARTAN POTASSIUM 25 MG PO TABS
25.0000 mg | ORAL_TABLET | Freq: Every day | ORAL | Status: DC
  Administered 2017-09-05 – 2017-09-12 (×8): 25 mg via ORAL
  Filled 2017-09-04 (×8): qty 1

## 2017-09-04 MED ORDER — PREGABALIN 25 MG PO CAPS
25.0000 mg | ORAL_CAPSULE | Freq: Two times a day (BID) | ORAL | Status: DC
  Administered 2017-09-04 – 2017-09-12 (×16): 25 mg via ORAL
  Filled 2017-09-04 (×16): qty 1

## 2017-09-04 NOTE — Progress Notes (Signed)
I have insurance approval to admit pt to inpt rehab today. I spoke with Dr. Roda ShuttersXu and he will prepare pt for d/c. I spoke with pt's daughter, Yvette Collier, by phone and she is in agreement to admit. RN CM made aware. I will make the arrnagements to admit today. [phone]

## 2017-09-04 NOTE — Discharge Summary (Signed)
Stroke Discharge Summary  Patient ID: Yvette Collier    l   MRN: [id number]      DOB: 1941-03-31  Date of Admission: 09/01/2017 Date of Discharge: 09/04/2017  Attending Physician:  Marvel PlanXu, Quanisha Drewry, MD, Stroke MD Consultant(s):   rehabilitation medicine Patient's PCP:  Hyman HopesBurns, Harriett P, MD  Discharge Diagnoses: Principal Problems:   Left temporal lobe hemorrhage (HCC)    left frontal trace SDH  Secondary problems:   PAF   HTN   DM   Hx of GIB   COPD  Past Medical History:  Diagnosis Date  . Asthma   . Chronic lower back pain   . COPD (chronic obstructive pulmonary disease) (HCC)   . Diabetes mellitus without complication (HCC)   . Gastrointestinal bleed   . GERD (gastroesophageal reflux disease)   . History of colon polyps   . Hypertension   . Hypertensive heart disease   . IBS (irritable bowel syndrome)   . Ischemic colitis (HCC)   . Non-obstructive Coronary Artery Disease    a. 05/2009 Cath [facility]): minimal nonobs dzs.  . Osteoarthritis   . PAF (paroxysmal atrial fibrillation) (HCC)    a. 09/2014 during GI illness;  b. CHA2DS2VASc = 5 (not currently on OAC 2/2 h/o GIB/ischemic colitis); c. 09/2014 Echo: EF 60-65%, imparied relaxation, nl RV size/fxn.  . Transient cerebral ischemia due to atrial fibrillation [facility](HCC)    Past Surgical History:  Procedure Laterality Date  . ABDOMINAL HYSTERECTOMY    . APPENDECTOMY    . BACK SURGERY     x 3, last 2004.  . CHOLECYSTECTOMY    . COLONOSCOPY WITH PROPOFOL N/A 03/20/2017   Procedure: COLONOSCOPY WITH PROPOFOL;  Surgeon: Wyline MoodKiran Anna, MD;  [facility];  Service: Endoscopy;  Laterality: N/A;  . ESOPHAGOGASTRODUODENOSCOPY (EGD) WITH PROPOFOL N/A 03/20/2017   Procedure: ESOPHAGOGASTRODUODENOSCOPY (EGD) WITH PROPOFOL;  Surgeon: Wyline MoodKiran Anna, MD;  Location: [facility] ENDOSCOPY;  Service: Endoscopy;  Laterality: N/A;  . FOOT SURGERY Right   . HEMORRHOID BANDING    . left total hip arthroplasty  2012  . STOMACH SURGERY    . TONSILLECTOMY       Medications to be continued on Rehab . Influenza vac split quadrivalent PF  0.5 mL Intramuscular Tomorrow-1000  . insulin aspart  0-15 Units Subcutaneous TID WC  . insulin aspart  0-5 Units Subcutaneous QHS  . losartan  25 mg Oral Daily  . mouth rinse  15 mL Mouth Rinse BID  . metoprolol tartrate  12.5 mg Oral BID  . pneumococcal 23 valent vaccine  0.5 mL Intramuscular Tomorrow-1000  . pregabalin  25 mg Oral BID  . simvastatin  10 mg Oral QPM    LABORATORY STUDIES CBC    Component Value Date/Time   WBC 5.8 09/04/2017 0606   RBC 3.54 (L) 09/04/2017 0606   HGB 10.5 (L) 09/04/2017 0606   HGB 10.5 (L) 09/24/2014 0439   HCT 32.0 (L) 09/04/2017 0606   HCT 33.6 (L) 09/24/2014 0439   PLT 251 09/04/2017 0606   PLT 298 09/24/2014 0439   MCV 90.4 09/04/2017 0606   MCV 95 09/24/2014 0439   MCH 29.7 09/04/2017 0606   MCHC 32.8 09/04/2017 0606   RDW 12.7 09/04/2017 0606   RDW 13.2 09/24/2014 0439   LYMPHSABS 0.6 (L) 09/01/2017 1100   LYMPHSABS 2.2 09/24/2014 0439   MONOABS 0.1 (L) 09/01/2017 1100   MONOABS 0.9 09/24/2014 0439   EOSABS 0.0 09/01/2017 1100   EOSABS 0.2 09/24/2014 0439  BASOSABS 0.0 09/01/2017 1100   BASOSABS 0.0 09/24/2014 0439   CMP    Component Value Date/Time   NA 138 09/04/2017 0606   NA 142 07/22/2016 1542   NA 140 09/24/2014 0439   K 3.7 09/04/2017 0606   K 4.1 09/24/2014 0439   CL 104 09/04/2017 0606   CL 104 09/24/2014 0439   CO2 27 09/04/2017 0606   CO2 31 09/24/2014 0439   GLUCOSE 120 (H) 09/04/2017 0606   GLUCOSE 162 (H) 09/24/2014 0439   BUN 19 09/04/2017 0606   BUN 21 07/22/2016 1542   BUN 14 09/24/2014 0439   CREATININE 1.19 (H) 09/04/2017 0606   CREATININE 1.30 09/24/2014 0439   CALCIUM 9.1 09/04/2017 0606   CALCIUM 7.9 (L) 09/24/2014 0439   PROT 7.8 09/01/2017 1100   PROT 6.6 09/20/2014 0503   ALBUMIN 4.4 09/01/2017 1100   ALBUMIN 3.1 (L) 09/20/2014 0503   AST 26 09/01/2017 1100   AST 41 (H) 09/20/2014 0503   ALT 18  09/01/2017 1100   ALT 27 09/20/2014 0503   ALKPHOS 105 09/01/2017 1100   ALKPHOS 90 09/20/2014 0503   BILITOT 0.5 09/01/2017 1100   BILITOT 0.3 09/20/2014 0503   GFRNONAA 43 (L) 09/04/2017 0606   GFRNONAA 42 (L) 09/24/2014 0439   GFRNONAA 59 (L) 09/27/2013 1420   GFRAA 49 (L) 09/04/2017 0606   GFRAA 51 (L) 09/24/2014 0439   GFRAA >60 09/27/2013 1420   COAGS Lab Results  Component Value Date   INR 1.05 09/01/2017   INR 1.0 09/15/2014   Lipid PanelNo results found for: CHOL, TRIG, HDL, CHOLHDL, VLDL, LDLCALC HgbA1C  Lab Results  Component Value Date   HGBA1C 6.8 (H) 09/02/2017   Urinalysis    Component Value Date/Time   COLORURINE YELLOW (A) 09/01/2017 1422   APPEARANCEUR CLOUDY (A) 09/01/2017 1422   APPEARANCEUR Clear 09/15/2014 0926   LABSPEC 1.010 09/01/2017 1422   LABSPEC 1.008 09/15/2014 0926   PHURINE 6.0 09/01/2017 1422   GLUCOSEU NEGATIVE 09/01/2017 1422   GLUCOSEU 50 mg/dL 16/09/9603 5409   HGBUR SMALL (A) 09/01/2017 1422   BILIRUBINUR NEGATIVE 09/01/2017 1422   BILIRUBINUR Negative 09/15/2014 0926   KETONESUR 5 (A) 09/01/2017 1422   PROTEINUR NEGATIVE 09/01/2017 1422   NITRITE NEGATIVE 09/01/2017 1422   LEUKOCYTESUR SMALL (A) 09/01/2017 1422   LEUKOCYTESUR Negative 09/15/2014 0926   Urine Drug Screen     Component Value Date/Time   LABOPIA NONE DETECTED 09/01/2017 1422   COCAINSCRNUR NONE DETECTED 09/01/2017 1422   LABBENZ NONE DETECTED 09/01/2017 1422   AMPHETMU NONE DETECTED 09/01/2017 1422   THCU NONE DETECTED 09/01/2017 1422   LABBARB NONE DETECTED 09/01/2017 1422    Alcohol Level    Component Value Date/Time   ETH <5 09/01/2017 1100     SIGNIFICANT DIAGNOSTIC STUDIES I have personally reviewed the radiological images below and agree with the radiology interpretations.  Ct Head Wo Contrast 09/02/2017 IMPRESSION: 1. Moderately motion degraded examination. 2. Evolving LEFT temporal lobe hematoma, relatively stable. 5 mm LEFT to RIGHT midline  shift without ventricular entrapment. 3. Trace LEFT holo hemispheric subdural hematoma versus motion artifact.   Ct Head Wo Contrast 09/01/2017 IMPRESSION: 1. Large acute left temporal lobe hematoma (approximately 15.9 ml), atypical for hypertensive hemorrhage. Consider hypertensive stroke and amyloid angiopathy. 2. No evidence of acute cervical spine fracture, traumatic subluxation or static signs of instability.  Ct Cervical Spine Wo Contrast 09/01/2017 IMPRESSION: 1. Large acute left temporal lobe hematoma (approximately 15.9 ml), atypical for  hypertensive hemorrhage. Consider hypertensive stroke and amyloid angiopathy. 2. No evidence of acute cervical spine fracture, traumatic subluxation or static signs of instability. 3. Critical Value/emergent results were called by telephone at the time of interpretation on 09/01/2017 at 11:45 am to Dr. Jene Every , who verbally acknowledged these results. Electronically Signed   By: Carey Bullocks M.D.   On: 09/01/2017 12:02   TTE  - Left ventricle: The cavity size was normal. Wall thickness was normal. Systolic function was normal. The estimated ejection fraction was in the range of 60% to 65%. Wall motion was normal; there were no regional wall motion abnormalities. Doppler parameters are consistent with abnormal left ventricular relaxation (grade 1 diastolic dysfunction). - Atrial septum: There was an atrial septal aneurysm. Impressions: - Normal LV systolic function; mild diastolic dysfunction; atrial septal aneurysm.  CT repeat  1. Stable size of left temporal lobe parenchymal hematoma and trace left-sided subdural hematoma. Stable mass effect with 5 mm left-to-right midline shift. 2. No new acute intracranial abnormality.    HISTORY OF PRESENT ILLNESS Yvette Collier is an 76 y.o.  AA  female past medical history of hypertension, diabetes mellitus, COPD transferred to [facility] after being  diagnosed with left  temporal hemorrhage at [facility] after presenting with altered mental status earlier this morning.  The patient is aphasic history was obtained by chart review. According to the ER provider notes, the patient was last seen normal of 11 AM the day before. Today her daughter went to her house that she was not able to get in touch with her over the phone. She has found on the sofa and apparently there was a questionable fall. Bp was elevated at 175 SBP on arrival and patient was started on Nicardipine drip.   Date last known well: 9.9.18 Time last known well: 11 am tPA Given: No, ICH NIHSS                                                                                       Modified Rankin: 0    HOSPITAL COURSE [name] is a 76 y.o. female with history of COPD, DM, GIB, HTN, CAD, PAF not on AC due to GIB followed by Dr. Mariah Milling presenting with AMS and likely fall at home. She did not receive IV t-PA due to ICH.   ICH:  left temporal moderate ICH and left frontal trace SDH, concerning for traumatic ICH due to fall. However, CAA, tumor or CVT (vein of labbe) can not be completely ruled out at this time.  Resultant partial global aphasia  CT head left temporal ICH and left frontal trace SDH  CT repeat stable ICH and SDH  MRI / MRA / MRV not performed due to pt noncooperation  Carotid Doppler  Not indicated  2D Echo  EF 60-65%  LDL not ordered  HgbA1c 6.8  SCDs for VTE prophylaxis  DIET DYS 2 Room service appropriate? Yes; Fluid consistency: Thin  aspirin 81 mg daily prior to admission, now on No antithrombotic  Ongoing aggressive stroke risk factor management  Therapy recommendations:  CIR  Disposition:  CIR  PAF  Follows with Dr. Mariah Milling  On metoprolol for rate control  12/2016 cardiac monitoring showed no afib  Not on AC due to hx of GIB  Hypertension  Stable            Off cardene              Resume home meds - cozaar and  metoprolol              Long-term BP goal < 160  Diabetes  HgbA1c 6.8, goal < 7.0  Controlled  SSI  Other Stroke Risk Factors  Advanced age  Coronary artery disease  Hx of TIA  Other Active Problems  Hx of GIB  COPD   DISCHARGE EXAM Blood pressure (!) 155/69, pulse (!) 57, temperature 98.4 F (36.9 C), temperature source Oral, resp. rate 14, height  (1.651 m), weight 67.8 kg (149 lb 7.6 oz), SpO2 100 %. General - Well nourished, well developed, in no apparent distress.  Ophthalmologic -  Fundi not visualized due to noncooperation.  Cardiovascular - Regular rate and rhythm.  Neuro - awake, alert, interactive, however, not following commands and word salad, improving. Significant perseveration. Eyes left gaze preference, but able to cross midline, right partial neglect. Not blinking to visual threat on the right. No significant facial asymmetry. tongue midline in mouth. Moving all extremities symmetrically. DTR 1+ and babinski not cooperative. Sensation, coordination and gait not tested.   Discharge Diet  DIET DYS 2 Room service appropriate? Yes; Fluid consistency: Thin liquids  DISCHARGE PLAN  Disposition:  Transfer to [facility] for ongoing PT, OT and ST  aspirin 81 mg daily for secondary stroke prevention.  Recommend ongoing risk factor control by Primary Care Physician at time of discharge from inpatient rehabilitation.  Follow-up Burns, Harriett P, MD in 2 weeks following discharge from rehab.  Follow-up with Dr. Marvel Plan, Stroke Clinic in 6 weeks, office to schedule an appointment.   35 minutes were spent preparing discharge.  Marvel Plan, MD PhD Stroke Neurology 09/04/2017 2:22 PM

## 2017-09-04 NOTE — Progress Notes (Signed)
Yvette Collier, Yvette Whitson G, RN Rehab Admission Coordinator Signed Physical Medicine and Rehabilitation  PMR Pre-admission Date of Service: 09/03/2017 4:24 PM  Related encounter: Admission (Discharged) from 09/01/2017 in [place] [facility] CV PROGRESSIVE CARE       [] Hide copied text PMR Admission Coordinator Pre-Admission Assessment  Patient: Yvette Collier is an 76 y.o., female MRN: [id number] DOB: Apr 09, 1941 Height: 5\' 5"  (165.1 cm) Weight: 67.8 kg (149 lb 7.6 oz)                                                                                                                                                  Insurance Information HMO: yes    PPO:      PCP:      IPA:      80/20:      OTHER: Medicare advantage plan PRIMARY: [employer] Medicare      Policy#: [id number]      Subscriber: pt CM Name: Kathrynn DuckingSherri Salem      Phone#: [phone]     Fax#: [phone] Pre-Cert#: [id number] approved for 7 days with f/u Rebeca AlertSunny Smith phone [phone] fax [phone]      Employer:  Benefits:  Phone #: [phone]     Name: 09/03/17 Eff. Date: 07/23/2017     Deduct: none      Out of Pocket Max: [phone]      Life Max: none CIR: no co pay days 1- 90 days      SNF: no co pay days 1-20: $167.50 co pay days 21-100 Outpatient: no co pay per visit     Co-Pay: visits per medical neccesity Home Health: 100%      Co-Pay: visits per medical neccesity DME: 80%     Co-Pay: 20% Providers: in network  SECONDARY: Medicaid      Policy#: [id number]      Subscriber: pt  Medicaid Application Date:       Case Manager:  Disability Application Date:       Case Worker:   Emergency Contact Information        Contact Information    Name Relation Home Work Mobile   Reid,Devona Daughter [phone]  [phone]   Mila HomerReid,Claudia D Daughter [phone]  [phone]   Marcellas,Gattis Son [phone]       Current Medical History  Patient Admitting Diagnosis:  Left temporal lobe hemorrhage  History of Present  Illness: : Unknown FoleyMary L Collier a 76 y.o.right handed femalewith history of hypertension, diabetes mellitus, COPD.  Admitted 09/01/2017 with altered mental statusright side weaknessas well as aphasia. Blood pressure elevated 175 systolic and was started on nicardipine drip. CT of the head showed a large acute left temporal lobe hematoma approximately 15.9 mL suspect hypertensive hemorrhage. CT cervical spine negative. Neurology follow-up with conservative care monitoring of blood pressure. Echocardiogram with ejection fraction 65% grade 1 diastolic dysfunction. Follow-up CT  of head09/11/2018stable noted 5 mm left-to-right midline shift without ventricular entrapmentand again repeated 09/03/2017 showing stable size of left temporal lobe parenchymal hematoma stable mass effect and no new acute intracranial abnormalities. Mechanical soft diet. Dysphagia 2 diet with thin liquids.    Total: 4 NIHSS  Past Medical History      Past Medical History:  Diagnosis Date  . Asthma   . Chronic lower back pain   . COPD (chronic obstructive pulmonary disease) (HCC)   . Diabetes mellitus without complication (HCC)   . Gastrointestinal bleed   . GERD (gastroesophageal reflux disease)   . History of colon polyps   . Hypertension   . Hypertensive heart disease   . IBS (irritable bowel syndrome)   . Ischemic colitis (HCC)   . Non-obstructive Coronary Artery Disease    a. 05/2009 Cath [facility]): minimal nonobs dzs.  . Osteoarthritis   . PAF (paroxysmal atrial fibrillation) (HCC)    a. 09/2014 during GI illness;  b. CHA2DS2VASc = 5 (not currently on OAC 2/2 h/o GIB/ischemic colitis); c. 09/2014 Echo: EF 60-65%, imparied relaxation, nl RV size/fxn.  . Transient cerebral ischemia due to atrial fibrillation (HCC)     Family History  family history includes Heart attack in her mother; Heart disease in her father; Hypertension in her brother, father, maternal aunt, and mother.  Prior  Rehab/Hospitalizations:  Has the patient had major surgery during 100 days prior to admission? No  Current Medications   Current Facility-Administered Medications:  .  hydrALAZINE (APRESOLINE) injection 10 mg, 10 mg, Intravenous, Q2H PRN, Marvel Plan, MD .  Influenza vac split quadrivalent PF (FLUZONE HIGH-DOSE) injection 0.5 mL, 0.5 mL, Intramuscular, Tomorrow-1000, Xu, Jindong, MD .  insulin aspart (novoLOG) injection 0-15 Units, 0-15 Units, Subcutaneous, TID WC, Aroor, Dara Lords, MD, 2 Units at 09/04/17 0912 .  insulin aspart (novoLOG) injection 0-5 Units, 0-5 Units, Subcutaneous, QHS, Aroor, Georgiana Spinner R, MD .  losartan (COZAAR) tablet 25 mg, 25 mg, Oral, Daily, Marvel Plan, MD, 25 mg at 09/04/17 0913 .  MEDLINE mouth rinse, 15 mL, Mouth Rinse, BID, Marvel Plan, MD, 15 mL at 09/03/17 2151 .  metoprolol tartrate (LOPRESSOR) tablet 12.5 mg, 12.5 mg, Oral, BID, Marvel Plan, MD, 12.5 mg at 09/04/17 0913 .  pneumococcal 23 valent vaccine (PNU-IMMUNE) injection 0.5 mL, 0.5 mL, Intramuscular, Tomorrow-1000, Marvel Plan, MD .  pregabalin (LYRICA) capsule 25 mg, 25 mg, Oral, BID, Marvel Plan, MD, 25 mg at 09/04/17 0913 .  simvastatin (ZOCOR) tablet 10 mg, 10 mg, Oral, QPM, Marvel Plan, MD, 10 mg at 09/03/17 1751  Patients Current Diet: DIET DYS 2 Room service appropriate? Yes; Fluid consistency: Thin check for pocketing  Precautions / Restrictions Precautions Precautions: Fall Precaution Comments: Posey Belt Restrictions Weight Bearing Restrictions: No   Has the patient had 2 or more falls or a fall with injury in the past year?No  Prior Activity Level Community (5-7x/wk): Independent and driving pta; Renato Gails; family asissted with house work; lived alone  Journalist, newspaper / Corporate investment banker Devices/Equipment: None Home Equipment: Cane - single point, Bedside commode  Prior Device Use: Indicate devices/aids used by the patient prior to current illness, exacerbation or  injury? None of the above  Prior Functional Level Prior Function Level of Independence: Independent Comments: driving, independent with everything  Self Care: Did the patient need help bathing, dressing, using the toilet or eating?  Independent  Indoor Mobility: Did the patient need assistance with walking from room to room (with or without device)?  Independent  Stairs: Did the patient need assistance with internal or external stairs (with or without device)? Independent  Functional Cognition: Did the patient need help planning regular tasks such as shopping or remembering to take medications? Independent  Current Functional Level Cognition  Arousal/Alertness: Awake/alert Overall Cognitive Status: Difficult to assess Difficult to assess due to: Impaired communication Current Attention Level: Sustained Orientation Level: Oriented to person, Oriented to place, Disoriented to time, Disoriented to situation Following Commands: Follows one step commands inconsistently, Follows one step commands with increased time Safety/Judgement: Decreased awareness of safety, Decreased awareness of deficits General Comments: Significant praxis difficulties as detailed below. Pt improves in ability to follow commands with hand over hand assist for initiation and visual cues. Difficult to fully assess cognition due to communication difficulties.  Attention: Sustained Sustained Attention: Impaired Sustained Attention Impairment: Verbal complex, Functional complex Awareness: Impaired Awareness Impairment: Intellectual impairment Problem Solving: Impaired Problem Solving Impairment: Functional basic, Verbal basic Behaviors: Restless Safety/Judgment: Impaired    Extremity Assessment (includes Sensation/Coordination)  Upper Extremity Assessment: Difficult to assess due to impaired cognition (strength WFL for functional tasks)  Lower Extremity Assessment: Defer to PT evaluation    ADLs   Overall ADL's : Needs assistance/impaired Eating/Feeding: Minimal assistance, Sitting Eating/Feeding Details (indicate cue type and reason): Min assist to initiate and complete tasks. Perseveration limiting pt.  Grooming: Minimal assistance, Standing Grooming Details (indicate cue type and reason): Initially min +2 assist for standing balance but then able to stand with min assist +1. Pt requiring verbal, visual, and tactile cues to locate soap on wall and perseverating on tasks. When asked to wash hands, pt attempting to use soap on perineal area.  Upper Body Bathing: Sitting, Moderate assistance Lower Body Bathing: Sit to/from stand, Moderate assistance Upper Body Dressing : Sitting, Moderate assistance Lower Body Dressing: Sit to/from stand, Moderate assistance Lower Body Dressing Details (indicate cue type and reason): Min assist for balance and multimodal cues to complete task.  Toilet Transfer: Minimal assistance, +2 for physical assistance, BSC Toileting- Clothing Manipulation and Hygiene: Minimal assistance, Sit to/from stand Toileting - Clothing Manipulation Details (indicate cue type and reason): for balance; multimodal cues to initiate task Functional mobility during ADLs: Minimal assistance, +2 for physical assistance General ADL Comments: Pt with significant communication deficits but able to intermittently follow commands with multimodal cues. Limited by praxis deficits; motor functions in tact for tasks but decreased cognition and praxis causing need for increased assistance.     Mobility  Overal bed mobility: Needs Assistance Bed Mobility: Supine to Sit Supine to sit: Min assist General bed mobility comments: multimodal cues for initiation and follow through of task    Transfers  Overall transfer level: Needs assistance Equipment used: 2 person hand held assist Transfers: Sit to/from Stand, Stand Pivot Transfers Sit to Stand: +2 physical assistance, Min assist Stand  pivot transfers: +2 physical assistance, Min assist General transfer comment: pt was able to communicate that she needs to urinate when sitting EOB; Min assist +2 (2 person handheld assist) for stability in standing.     Ambulation / Gait / Stairs / Wheelchair Mobility  Ambulation/Gait Ambulation/Gait assistance: Min assist, +2 safety/equipment Ambulation Distance (Feet): 6 Feet Assistive device: 2 person hand held assist Gait Pattern/deviations: Step-through pattern, Decreased stride length, Trunk flexed General Gait Details: pt with guarded movements but grossly steady gait; pt required directional cues both visual and tactile  Gait velocity: Decreased Gait velocity interpretation: Below normal speed for age/gender    Posture / Balance  Dynamic Sitting Balance Sitting balance - Comments: Able to statically sit with supervision.  Balance Overall balance assessment: Needs assistance Sitting-balance support: Feet supported, No upper extremity supported Sitting balance-Leahy Scale: Fair Sitting balance - Comments: Able to statically sit with supervision.  Postural control: Posterior lean Standing balance support: Single extremity supported Standing balance-Leahy Scale: Poor Standing balance comment: Min assist to maintain stability during dynamic and static standing tasks.     Special needs/care consideration BiPAP/CPAP  N/a CPM  N/a Continuous Drip IV yes at 75 cc/hr due to increase in creat per Dr. Roda [facility] D[facility]  N/a Life Vest  N/a Oxygen  N/a Special Bed  N/a Trach Size  N/A Wound Vac N/A Skin intact                           Bowel mgmt: continent LBM 09/03/17 Bladder mgmt: some incontinence Diabetic mgmt Hgb A1c 6.8   Previous Home Environment Living Arrangements: Alone  Lives With: Alone Available Help at Discharge: Available 24 hours/day, Family Type of Home: House Home Layout: One level Home Access: Level entry Bathroom Shower/Tub: Tub/shower unit,  Engineer, building services: Handicapped height Bathroom Accessibility: Yes Home Care Services: No  Discharge Living Setting Plans for Discharge Living Setting: Patient's home, Alone Type of Home at Discharge: House Discharge Home Layout: One level Discharge Home Access: Level entry Discharge Bathroom Shower/Tub: Tub/shower unit, Curtain Discharge Bathroom Toilet: Handicapped height Discharge Bathroom Accessibility: Yes How Accessible: Accessible via walker Does the patient have any problems obtaining your medications?: No  Social/Family/Support Systems Patient Roles: Parent, Other (Comment) (pastor) Contact Information: Devona, daughter is main contact and POA Anticipated Caregiver: daughter, daughter in law Anticipated Caregiver's Contact Information: see above Ability/Limitations of Caregiver: Devona works nights, daughter in Social worker and other dauhgter will help do 24/7 care Caregiver Availability: 24/7 Discharge Plan Discussed with Primary Caregiver: Yes Is Caregiver In Agreement with Plan?: Yes Does Caregiver/Family have Issues with Lodging/Transportation while Pt is in Rehab?: No  Goals/Additional Needs Patient/Family Goal for Rehab: supervision to min assist with PT, OT, and SLP Expected length of stay: [facility]OS 12-18 days Pt/Family Agrees to Admission and willing to participate: Yes Program Orientation Provided & Reviewed with Pt/Caregiver Including Roles  & Responsibilities: Yes  Decrease burden of Care through IP rehab admission: N/A  Possible need for SNF placement upon discharge:Not anticipated  Patient Condition: This patient's condition remains as documented in the consult dated 09/02/2017, in which the Rehabilitation Physician determined and documented that the patient's condition is appropriate for intensive rehabilitative care in an inpatient rehabilitation facility. Will admit to inpatient rehab today.  Preadmission Screen Completed By:  Clois Dupes,  09/04/2017 10:23 AM ______________________________________________________________________   Discussed status with Dr. Wynn Banker on 09/04/2017 at  1023 and received telephone approval for admission today.  Admission Coordinator:  Clois Dupes, time 6962 Date 09/04/2017       Cosigned by: Erick Colace, MD at 09/04/2017 10:25 AM  Revision History

## 2017-09-04 NOTE — Progress Notes (Signed)
Ranelle Oyster, MD Physician Signed Physical Medicine and Rehabilitation  Consult Note Date of Service: 09/02/2017 3:41 PM  Related encounter: Admission (Discharged) from 09/01/2017 in [facility] PROGRESSIVE CARE     Expand All Collapse All   Hide copied text Hover for attribution information      Physical Medicine and Rehabilitation Consult Reason for Consult: Altered mental status with aphasia. Referring Physician: Dr.Xu   HPI: Yvette Collier is a 76 y.o.right handed  female with history of hypertension, diabetes mellitus, COPD. Patient lives alone independent prior to admission and works as a Education officer, environmental. One level home. Admitted 09/01/2017 with altered mental status as well as aphasia. Blood pressure elevated 175 systolic and was started on nicardipine drip. CT of the head showed a large acute left temporal lobe hematoma approximately 15.9 mL suspect hypertensive hemorrhage. CT cervical spine negative. Neurology follow-up with conservative care monitoring of blood pressure. Echocardiogram pending. Follow-up CT of head stable noted 5 mm left-to-right midline shift without ventricular entrapment. Mechanical soft diet. Physical therapy evaluation completed 09/02/2017 with recommendations of physical medicine rehabilitation consult.   Review of Systems  Constitutional: Negative for chills and fever.  HENT: Negative for hearing loss.   Eyes: Negative for blurred vision and double vision.  Respiratory: Positive for shortness of breath.   Cardiovascular: Positive for palpitations.  Gastrointestinal: Positive for constipation. Negative for nausea and vomiting.       GERD  Genitourinary: Negative for dysuria, flank pain and hematuria.  Musculoskeletal: Positive for back pain.  Skin: Negative for rash.  Neurological: Positive for headaches. Negative for seizures and weakness.  All other systems reviewed and are negative.      Past Medical History:  Diagnosis Date  . Asthma    . Chronic lower back pain   . COPD (chronic obstructive pulmonary disease) (HCC)   . Diabetes mellitus without complication (HCC)   . Gastrointestinal bleed   . GERD (gastroesophageal reflux disease)   . History of colon polyps   . Hypertension   . Hypertensive heart disease   . IBS (irritable bowel syndrome)   . Ischemic colitis (HCC)   . Non-obstructive Coronary Artery Disease    a. 05/2009 Cath [facility]): minimal nonobs dzs.  . Osteoarthritis   . PAF (paroxysmal atrial fibrillation) (HCC)    a. 09/2014 during GI illness;  b. CHA2DS2VASc = 5 (not currently on OAC 2/2 h/o GIB/ischemic colitis); c. 09/2014 Echo: EF 60-65%, imparied relaxation, nl RV size/fxn.  . Transient cerebral ischemia due to atrial fibrillation [facility])         Past Surgical History:  Procedure Laterality Date  . ABDOMINAL HYSTERECTOMY    . APPENDECTOMY    . BACK SURGERY     x 3, last 2004.  . CHOLECYSTECTOMY    . COLONOSCOPY WITH PROPOFOL N/A 03/20/2017   Procedure: COLONOSCOPY WITH PROPOFOL;  Surgeon: Wyline Mood, MD;  Location: [facility] ENDOSCOPY;  Service: Endoscopy;  Laterality: N/A;  . ESOPHAGOGASTRODUODENOSCOPY (EGD) WITH PROPOFOL N/A 03/20/2017   Procedure: ESOPHAGOGASTRODUODENOSCOPY (EGD) WITH PROPOFOL;  Surgeon: Wyline Mood, MD;  Location: [facility] ENDOSCOPY;  Service: Endoscopy;  Laterality: N/A;  . FOOT SURGERY Right   . HEMORRHOID BANDING    . left total hip arthroplasty  2012  . STOMACH SURGERY    . TONSILLECTOMY          Family History  Problem Relation Age of Onset  . Heart attack Mother   . Hypertension Mother   . Heart disease Father   .  Hypertension Father   . Hypertension Brother   . Hypertension Maternal Aunt    Social History:  reports that she has never smoked. She has never used smokeless tobacco. She reports that she does not drink alcohol or use drugs. Allergies:       Allergies  Allergen Reactions  . Lisinopril Swelling and Other (See  Comments)    Reaction:  Tongue/mouth swelling   . Effexor [Venlafaxine] Hives  . Hctz [Hydrochlorothiazide] Other (See Comments)    unknown  . Ivp Dye [Iodinated Diagnostic Agents] Hives  . Prednisone Other (See Comments)    unspecified  . Sulfa Antibiotics Hives            Facility-Administered Medications Prior to Admission  Medication Dose Route Frequency Provider Last Rate Last Dose  . ceFAZolin (ANCEF) IVPB 1 g/50 mL premix  1 g Intravenous Once Ewing Schlein, MD      . ceFAZolin (ANCEF) IVPB 1 g/50 mL premix  1 g Intravenous Once Ewing Schlein, MD      . ceFAZolin (ANCEF) IVPB 1 g/50 mL premix  1 g Intravenous Once Ewing Schlein, MD      . lactated ringers infusion 1,000 mL  1,000 mL Intravenous Continuous Ewing Schlein, MD      . lactated ringers infusion 1,000 mL  1,000 mL Intravenous Continuous Ewing Schlein, MD 125 mL/hr at 08/14/16 0819 1,000 mL at 08/14/16 0819  . orphenadrine (NORFLEX) injection 60 mg  60 mg Intramuscular Once Ewing Schlein, MD      . orphenadrine (NORFLEX) injection 60 mg  60 mg Intramuscular Once Ewing Schlein, MD             Medications Prior to Admission  Medication Sig Dispense Refill  . cetirizine (ZYRTEC) 10 MG tablet Take 10 mg by mouth daily.    . Cholecalciferol (VITAMIN D3) 1000 units CAPS Take 1,000 Units by mouth daily.    Marland Kitchen DEXILANT 60 MG capsule TAKE ONE (1) CAPSULE EACH DAY FOR ACID REFLUX (Patient taking differently: TAKE 60 MG EACH DAY FOR ACID REFLUX) 30 capsule 3  . dicyclomine (BENTYL) 10 MG capsule TAKE 1 CAPSULE BY MOUTH 4 TIMES DAILY BEFORE MEALS AND AT BEDTIME (Patient taking differently: TAKE 10 MG BY MOUTH 4 TIMES DAILY BEFORE MEALS AND AT BEDTIME) 120 capsule 0  . losartan (COZAAR) 25 MG tablet Take 25 mg by mouth daily.     . metoprolol tartrate (LOPRESSOR) 25 MG tablet Take 0.5 tablets (12.5 mg total) by mouth 2 (two) times daily. 30 tablet 6  . naproxen (NAPROSYN) 250 MG tablet Take 250 mg by mouth 2 (two)  times daily with a meal.    . oxyCODONE (OXY IR/ROXICODONE) 5 MG immediate release tablet Limit 1 tablet by mouth 2-3 times per day if tolerated (Patient taking differently: Take 5 mg by mouth See admin instructions. Limit 5 mg by mouth 2-3 times per day if tolerated) 90 tablet 0  . pregabalin (LYRICA) 25 MG capsule Limit  1 tablet by mouth per day or twice per day if tolerated (Patient taking differently: Take 25 mg by mouth See admin instructions. Take 25 mg by mouth per day or twice per day if tolerated) 60 capsule 0  . simvastatin (ZOCOR) 10 MG tablet Take 10 mg by mouth every evening.     Marland Kitchen aspirin EC 81 MG tablet Take 81 mg by mouth daily.    Marland Kitchen linaclotide (LINZESS) 145 MCG CAPS capsule Take 1 capsule (145 mcg total) by mouth daily  before breakfast. (Patient not taking: Reported on 09/02/2017) 30 capsule 2  . omeprazole (PRILOSEC) 40 MG capsule Take 1 capsule (40 mg total) by mouth daily. (Patient not taking: Reported on 09/02/2017) 90 capsule 3  . Probiotic Product (VSL#3) CAPS Take 1 capsule by mouth 2 (two) times daily. 60 capsule 11  . sucralfate (CARAFATE) 1 GM/10ML suspension Take 10 mLs (1 g total) by mouth 4 (four) times daily. (Patient not taking: Reported on 09/02/2017) 420 mL 1    Home: Home Living Family/patient expects to be discharged to:: Private residence Living Arrangements: Alone Available Help at Discharge: Family, Available PRN/intermittently Type of Home: House Home Access: Level entry Home Layout: One level Bathroom Shower/Tub: Engineer, manufacturing systems: Handicapped height Bathroom Accessibility: Yes Home Equipment: Cane - single point, Bedside commode  Functional History: Prior Function Level of Independence: Independent Comments: driving, independent with everything Functional Status:  Mobility: Bed Mobility Overal bed mobility: Needs Assistance Bed Mobility: Supine to Sit Supine to sit: Mod assist General bed mobility comments: Assist for LE  advancement towards EOB. Pt rquired tactile cues to reach forward and flex trunk. Therapist assist to elevate trunk into full sitting position provided.  Transfers Overall transfer level: Needs assistance Equipment used: 2 person hand held assist Transfers: Sit to/from Stand, Stand Pivot Transfers Sit to Stand: +2 physical assistance, Min assist Stand pivot transfers: Mod assist General transfer comment: Patient required less assistance when determined to perform activity  but was unable to cognitively follow directions at all times and therefore required constant verbal and tactile cuing to perform transfers. Ambulation/Gait Ambulation/Gait assistance: Min guard Ambulation Distance (Feet): 5 Feet Assistive device: 2 person hand held assist Gait Pattern/deviations: Step-through pattern, Decreased stride length, Trunk flexed General Gait Details: Pt attempting to ambulate in room however was actively urinating while standing, and required assist to return to sitting for clean-up. Pt ambulated ~5 feet total.  Gait velocity: Decreased Gait velocity interpretation: Below normal speed for age/gender  ADL:  Cognition: Cognition Overall Cognitive Status: Impaired/Different from baseline Arousal/Alertness: Awake/alert Orientation Level: Other (comment) (Pt has aphasia) Attention: Sustained Sustained Attention: Impaired Sustained Attention Impairment: Verbal complex, Functional complex Awareness: Impaired Awareness Impairment: Intellectual impairment Problem Solving: Impaired Problem Solving Impairment: Functional basic, Verbal basic Behaviors: Restless Safety/Judgment: Impaired Cognition Arousal/Alertness: Awake/alert Behavior During Therapy: Restless Overall Cognitive Status: Impaired/Different from baseline Area of Impairment: Orientation, Attention, Memory, Following commands, Safety/judgement, Awareness, Problem solving Orientation Level: Disoriented to, Person, Place, Time,  Situation Current Attention Level: Sustained Memory: Decreased short-term memory Following Commands: Follows one step commands inconsistently, Follows one step commands with increased time Safety/Judgement: Decreased awareness of safety, Decreased awareness of deficits Awareness: Intellectual Problem Solving: Slow processing, Decreased initiation, Difficulty sequencing, Requires verbal cues, Requires tactile cues  Blood pressure 124/74, pulse 81, temperature (!) 101 F (38.3 C), temperature source Axillary, resp. rate 18, SpO2 100 %. Physical Exam  HENT:  Head: Normocephalic.  Eyes: EOM are normal.  Neck: Normal range of motion. Neck supple. No thyromegaly present.  Cardiovascular: Normal rate, regular rhythm and normal heart sounds.   Respiratory: Effort normal. No respiratory distress.  GI: Soft. Bowel sounds are normal. She exhibits no distension.  Neurological: She is alert.  Makes eye contact with examiner. Pleasantly confused with some word salad, rec>exp language deficits. She did follow some simple demonstrated commands but was inconsistent only following commands 10-20%. Was constantly laughing/in pleasant mood. RUE with grossly 1 to 1+/5 movement (inconsistent). RLE: tr to 1/5 with extensor tone/cogwheeling noted.  Skin: Skin is warm and dry.    Lab Results Last 24 Hours       Results for orders placed or performed during the hospital encounter of 09/01/17 (from the past 24 hour(s))  MRSA PCR Screening     Status: None   Collection Time: 09/01/17  9:01 PM  Result Value Ref Range   MRSA by PCR NEGATIVE NEGATIVE  Glucose, capillary     Status: Abnormal   Collection Time: 09/01/17 11:43 PM  Result Value Ref Range   Glucose-Capillary 196 (H) 65 - 99 mg/dL  Hemoglobin Z6X     Status: Abnormal   Collection Time: 09/02/17  8:05 AM  Result Value Ref Range   Hgb A1c MFr Bld 6.8 (H) 4.8 - 5.6 %   Mean Plasma Glucose 148.46 mg/dL  Glucose, capillary     Status:  Abnormal   Collection Time: 09/02/17  8:20 AM  Result Value Ref Range   Glucose-Capillary 150 (H) 65 - 99 mg/dL   Comment 1 Notify RN    Comment 2 Document in Chart   Glucose, capillary     Status: Abnormal   Collection Time: 09/02/17 11:34 AM  Result Value Ref Range   Glucose-Capillary 133 (H) 65 - 99 mg/dL   Comment 1 Notify RN    Comment 2 Document in Chart       Imaging Results (Last 48 hours)  Dg Pelvis 1-2 Views  Result Date: 09/01/2017 CLINICAL DATA:  Possible fall. EXAM: PELVIS - 1-2 VIEW COMPARISON:  Radiograph dated 12/25/2016 FINDINGS: There is no evidence of pelvic fracture or diastasis. No pelvic bone lesions are seen. Left total hip prosthesis. Fusion hardware in the lower lumbar spine. IMPRESSION: No acute abnormalities. Electronically Signed   By: Francene Boyers M.D.   On: 09/01/2017 12:04   Ct Head Wo Contrast  Result Date: 09/02/2017 CLINICAL DATA:  Followup intracranial hemorrhage. EXAM: CT HEAD WITHOUT CONTRAST TECHNIQUE: Contiguous axial images were obtained from the base of the skull through the vertex without intravenous contrast. COMPARISON:  CT HEAD September 01, 2017 FINDINGS: Moderately motion degraded examination. BRAIN: Evolving 4 x 1 x 2.4 cm LEFT temporal intraparenchymal hematoma, relatively unchanged with surrounding low-density vasogenic edema. Regional mass effect. 5 mm LEFT-to-RIGHT midline shift. No ventricular entrapment. No hydrocephalus. No acute large vascular territory infarct. Patchy supratentorial Russom matter hypodensities compatible with mild chronic small vessel ischemic disease, less than expected for age. Trace possible LEFT subdural hematoma versus motion artifact. Basal cisterns are patent. VASCULAR: Mild calcific atherosclerosis of the carotid siphons. SKULL: No skull fracture. No significant scalp soft tissue swelling. SINUSES/ORBITS: The mastoid air-cells and included paranasal sinuses are well-aerated.The included ocular  globes and orbital contents are non-suspicious. OTHER: None. IMPRESSION: 1. Moderately motion degraded examination. 2. Evolving LEFT temporal lobe hematoma, relatively stable. 5 mm LEFT to RIGHT midline shift without ventricular entrapment. 3. Trace LEFT holo hemispheric subdural hematoma versus motion artifact. Electronically Signed   By: Awilda Metro M.D.   On: 09/02/2017 05:56   Ct Head Wo Contrast  Result Date: 09/01/2017 CLINICAL DATA:  Found down this morning. Unknown if patient fell. Focal neuro deficit greater than 6 hours, stroke suspected. EXAM: CT HEAD WITHOUT CONTRAST CT CERVICAL SPINE WITHOUT CONTRAST TECHNIQUE: Multidetector CT imaging of the head and cervical spine was performed following the standard protocol without intravenous contrast. Multiplanar CT image reconstructions of the cervical spine were also generated. COMPARISON:  CT head 06/21/2017.  CT cervical spine 06/14/2013. FINDINGS: CT HEAD FINDINGS Brain:  There is a large acute hematoma posteriorly in the left temporal lobe, measuring up to 5.1 x 2.4 x 2.6 cm. This contains a fluid- fluid level and is associated with surrounding vasogenic edema. There is mild resulting local mass effect, but no focal herniation or hydrocephalus. There is no extra-axial fluid collection or midline shift. Mild underlying chronic small vessel ischemic changes are present in the periventricular Zaccaro matter. Vascular: Mild intracranial vascular calcifications. Skull: Intact without acute findings. Sinuses/Orbits: The visualized paranasal sinuses, mastoid air cells and middle ears are clear. The orbital contents appear unremarkable. Other: None. CT CERVICAL SPINE FINDINGS Alignment: Mild cervicothoracic scoliosis. No evidence of traumatic subluxation. Skull base and vertebrae: No evidence of acute cervical spine fracture. Soft tissues and spinal canal: No prevertebral fluid or swelling. No visible canal hematoma. Disc levels: Minimal spondylosis for  age. No high-grade spinal stenosis. Upper chest: Unremarkable. Other: None. IMPRESSION: 1. Large acute left temporal lobe hematoma (approximately 15.9 ml), atypical for hypertensive hemorrhage. Consider hypertensive stroke and amyloid angiopathy. 2. No evidence of acute cervical spine fracture, traumatic subluxation or static signs of instability. 3. Critical Value/emergent results were called by telephone at the time of interpretation on 09/01/2017 at 11:45 am to Dr. Jene EveryOBERT KINNER , who verbally acknowledged these results. Electronically Signed   By: Carey BullocksWilliam  Veazey M.D.   On: 09/01/2017 12:02   Ct Cervical Spine Wo Contrast  Result Date: 09/01/2017 CLINICAL DATA:  Found down this morning. Unknown if patient fell. Focal neuro deficit greater than 6 hours, stroke suspected. EXAM: CT HEAD WITHOUT CONTRAST CT CERVICAL SPINE WITHOUT CONTRAST TECHNIQUE: Multidetector CT imaging of the head and cervical spine was performed following the standard protocol without intravenous contrast. Multiplanar CT image reconstructions of the cervical spine were also generated. COMPARISON:  CT head 06/21/2017.  CT cervical spine 06/14/2013. FINDINGS: CT HEAD FINDINGS Brain: There is a large acute hematoma posteriorly in the left temporal lobe, measuring up to 5.1 x 2.4 x 2.6 cm. This contains a fluid- fluid level and is associated with surrounding vasogenic edema. There is mild resulting local mass effect, but no focal herniation or hydrocephalus. There is no extra-axial fluid collection or midline shift. Mild underlying chronic small vessel ischemic changes are present in the periventricular Conlee matter. Vascular: Mild intracranial vascular calcifications. Skull: Intact without acute findings. Sinuses/Orbits: The visualized paranasal sinuses, mastoid air cells and middle ears are clear. The orbital contents appear unremarkable. Other: None. CT CERVICAL SPINE FINDINGS Alignment: Mild cervicothoracic scoliosis. No evidence of  traumatic subluxation. Skull base and vertebrae: No evidence of acute cervical spine fracture. Soft tissues and spinal canal: No prevertebral fluid or swelling. No visible canal hematoma. Disc levels: Minimal spondylosis for age. No high-grade spinal stenosis. Upper chest: Unremarkable. Other: None. IMPRESSION: 1. Large acute left temporal lobe hematoma (approximately 15.9 ml), atypical for hypertensive hemorrhage. Consider hypertensive stroke and amyloid angiopathy. 2. No evidence of acute cervical spine fracture, traumatic subluxation or static signs of instability. 3. Critical Value/emergent results were called by telephone at the time of interpretation on 09/01/2017 at 11:45 am to Dr. Jene EveryOBERT KINNER , who verbally acknowledged these results. Electronically Signed   By: Carey BullocksWilliam  Veazey M.D.   On: 09/01/2017 12:02     Assessment/Plan: Diagnosis: left temporal lobe hemorrhage with right hemiparesis and rec>exp language deficits 1. Does the need for close, 24 hr/day medical supervision in concert with the patient's rehab needs make it unreasonable for this patient to be served in a less intensive setting? Yes 2.  Co-Morbidities requiring supervision/potential complications: HTN, post-hemorrhage sequelae, nutrition3 3. Due to bladder management, bowel management, safety, skin/wound care, disease management, medication administration, pain management and patient education, does the patient require 24 hr/day rehab nursing? Yes 4. Does the patient require coordinated care of a physician, rehab nurse, PT (1-2 hrs/day, 5 days/week), OT (1-2 hrs/day, 5 days/week) and SLP (1-2 hrs/day, 5 days/week) to address physical and functional deficits in the context of the above medical diagnosis(es)? Yes Addressing deficits in the following areas: balance, endurance, locomotion, strength, transferring, bowel/bladder control, bathing, dressing, feeding, grooming, toileting, cognition, speech, language, swallowing and  psychosocial support 5. Can the patient actively participate in an intensive therapy program of at least 3 hrs of therapy per day at least 5 days per week? Yes 6. The potential for patient to make measurable gains while on inpatient rehab is good 7. Anticipated functional outcomes upon discharge from inpatient rehab are supervision and min assist  with PT, supervision and min assist with OT, min assist and mod assist with SLP. 8. Estimated rehab length of stay to reach the above functional goals is: 12-18 days 9. Anticipated D/C setting: Home 10. Anticipated post D/C treatments: HH therapy and Outpatient therapy 11. Overall Rehab/Functional Prognosis: excellent  RECOMMENDATIONS: This patient's condition is appropriate for continued rehabilitative care in the following setting: CIR Patient has agreed to participate in recommended program. N/A Note that insurance prior authorization may be required for reimbursement for recommended care.  Comment: Rehab Admissions Coordinator to follow up.  Thanks,  Ranelle Oyster, MD, [place] [facility]    Charlton Amor., PA-C 09/02/2017    Revision History                        Routing History

## 2017-09-04 NOTE — Progress Notes (Signed)
  Speech Language Pathology Treatment: Dysphagia;Cognitive-Linquistic  Patient Details Name: Atara L Weed MRN: [id number] DOB: 26-Dec-1940 Today's Date: 09/04/2017 Time: [id number] SLP Time Calculation (min) (ACUTE ONLY): 18Tomma Rakers min  Assessment / Plan / Recommendation Clinical Impression  RN tech supervising/assiting with breakfast meal when SLP arrived. Pt demonstrated mildly improved mastication and transit with sausage and spontaneously cleared oral cavity given additional time without requiring SLP's assist. She continues to need full supervision/assist with all meals. No cough, throat clear or wet vocal quality exhibited. Continue Dys 2 texture and thin liquids.   Pt able to relay that she has 2 boys and 2 girls with neologisms present. Verbal perseveration while counting using visual aid and mixture of neologisms. Good tune during familiar song with reduced word accuracy today in unison with therapist. Pt presently not aware of verbal errors nor difficulty comprehending spoken language. Plans for discharge to CIR today.     HPI HPI: Ptis a 76 y.o.femalewith medical history of hypertension, diabetes mellitus, COPD and aphasic history. [facility] being [facility] with a [facility] temporal [facility] after presenting with altered mental status on 9/10. CT shows evolving left temporal lobe hematoma, relatively stable, 5 mm left to right midline shift without ventricular entrapment and trace left holo hemispheric subdural hematoma versus motion artifact.      SLP Plan  Continue with current plan of care       Recommendations  Diet recommendations: Dysphagia 2 (fine chop);Thin liquid Liquids provided via: Cup;Straw Medication Administration: Crushed with puree Supervision: Patient able to self feed;Full supervision/cueing for compensatory strategies Compensations: Slow rate;Small sips/bites;Lingual sweep for clearance of  pocketing Postural Changes and/or Swallow Maneuvers: Seated upright 90 degrees                Oral Care Recommendations: Oral care BID Follow up Recommendations: Inpatient Rehab SLP Visit Diagnosis: Dysphagia, oral phase (R13.11) Plan: Continue with current plan of care       GO                Royce MacadamiaLitaker, Devario Bucklew Willis 09/04/2017, 10:54 AM   Breck CoonsLisa Willis Lonell FaceLitaker M.Ed [employer]-SLP Pager [phone]

## 2017-09-04 NOTE — Progress Notes (Signed)
Patient ID: Yvette Collier, female   DOB: 09/14/1941, 76 y.o.   MRN: [id number] Patient admitted to 4M01 via wheelchair, escorted by nursing staff.  Patient unable to verbalize understanding to rehab process r/t aphasia.  Appears to be in no immediate distress at this time.  Patient placed in low bed for safety.  Patient left resting in bed with bed alarm engaged.  Dani Gobbleeardon, Tammye Kahler J, RN

## 2017-09-04 NOTE — Progress Notes (Signed)
Received call from Inpt rehab RN, gave report. Paged neuro PA to get discharge order.

## 2017-09-04 NOTE — H&P (Signed)
Physical Medicine and Rehabilitation Admission H&P       HPI: Yvette Collier is a 76 y.o.right handed  female with history of hypertension, [name], diabetes mellitus, COPD. Patient lives alone independent prior to admission and works as a Theme park manager. One level home. Admitted 09/01/2017 with altered mental status right side weakness as well as aphasia. Blood pressure elevated 517 systolic and was started on nicardipine drip. CT of the head showed a large acute left temporal lobe hematoma approximately 15.9 mL suspect hypertensive hemorrhage. CT cervical spine negative. Neurology follow-up with conservative care monitoring of blood pressure. Echocardiogram with ejection fraction 00% grade 1 diastolic dysfunction. Follow-up CT of head 09/02/2017 stable noted 5 mm left-to-right midline shift without ventricular entrapment and again repeated 09/03/2017 showing stable size of left temporal lobe parenchymal hematoma stable mass effect and no new acute intracranial abnormalities. Mild elevation in creatinine 1.19 from baseline 0.87 and monitor. Mechanical soft diet. Physical therapy evaluation completed 09/02/2017 with recommendations of physical medicine rehabilitation consult. Patient was admitted for a comprehensive rehabilitation program.  Fluent aphasia   Review of Systems  Constitutional: Negative for chills and fever.  HENT: Negative for hearing loss.   Eyes: Negative for blurred vision and double vision.  Respiratory: Positive for shortness of breath.   Cardiovascular: Positive for palpitations. Negative for chest pain and leg swelling.  Gastrointestinal: Positive for diarrhea. Negative for constipation, nausea and vomiting.       GERD  Genitourinary: Negative for dysuria, flank pain and hematuria.  Musculoskeletal: Positive for back pain.  Skin: Negative for rash.  Neurological: Positive for headaches. Negative for seizures.  All other systems reviewed and  are negative.       Past Medical History:  Diagnosis Date  . Asthma    . Chronic lower back pain    . COPD (chronic obstructive pulmonary disease) ([place])    . Diabetes mellitus without complication ([place])    . Gastrointestinal bleed    . GERD (gastroesophageal reflux disease)    . History of colon polyps    . Hypertension    . Hypertensive heart disease    . IBS (irritable bowel syndrome)    . Ischemic colitis ([place])    . Non-obstructive Coronary Artery Disease      a. 05/2009 [facility]): minimal nonobs dzs.  . Osteoarthritis    . PAF (paroxysmal atrial fibrillation) ([place])      a. 09/2014 during GI illness;  b. CHA2DS2VASc = 5 (not currently on [place] 2/2 h/o GIB/ischemic colitis); c. 09/2014 Echo: EF 60-65%, imparied relaxation, nl RV size/fxn.  . Transient cerebral ischemia due to atrial fibrillation [facility])           Past Surgical History:  Procedure Laterality Date  . ABDOMINAL HYSTERECTOMY      . APPENDECTOMY      . BACK SURGERY        x 3, last 2004.  . CHOLECYSTECTOMY      . COLONOSCOPY WITH PROPOFOL N/A 03/20/2017    Procedure: COLONOSCOPY WITH PROPOFOL;  Surgeon: Jonathon Bellows, MD;  Location: [facility] ENDOSCOPY;  Service: Endoscopy;  Laterality: N/A;  . ESOPHAGOGASTRODUODENOSCOPY (EGD) WITH PROPOFOL N/A 03/20/2017    Procedure: ESOPHAGOGASTRODUODENOSCOPY (EGD) WITH PROPOFOL;  Surgeon: Jonathon Bellows, MD;  Location: [facility] ENDOSCOPY;  Service: Endoscopy;  Laterality: N/A;  . FOOT SURGERY Right    . HEMORRHOID BANDING      . left total hip arthroplasty   2012  .  STOMACH SURGERY      . TONSILLECTOMY             Family History  Problem Relation Age of Onset  . Heart attack Mother    . Hypertension Mother    . Heart disease Father    . Hypertension Father    . Hypertension Brother    . Hypertension Maternal Aunt      Social History:  reports that she has never smoked. She has never used smokeless tobacco. She reports that she does not drink alcohol or use drugs. Allergies:        Allergies  Allergen Reactions  . Lisinopril Swelling and Other (See Comments)      Reaction:  Tongue/mouth swelling   . Effexor [Venlafaxine] Hives  . Hctz [Hydrochlorothiazide] Other (See Comments)      unknown  . Ivp Dye [Iodinated Diagnostic Agents] Hives  . Prednisone Other (See Comments)      unspecified  . Sulfa Antibiotics Hives             Facility-Administered Medications Prior to Admission  Medication Dose Route Frequency Provider Last Rate Last Dose  . ceFAZolin (ANCEF) IVPB 1 g/50 mL premix  1 g Intravenous Once Mohammed Kindle, MD      . ceFAZolin (ANCEF) IVPB 1 g/50 mL premix  1 g Intravenous Once Mohammed Kindle, MD      . ceFAZolin (ANCEF) IVPB 1 g/50 mL premix  1 g Intravenous Once Mohammed Kindle, MD      . lactated ringers infusion 1,000 mL  1,000 mL Intravenous Continuous Mohammed Kindle, MD      . lactated ringers infusion 1,000 mL  1,000 mL Intravenous Continuous Mohammed Kindle, MD 125 mL/hr at 08/14/16 0819 1,000 mL at 08/14/16 0819  . orphenadrine (NORFLEX) injection 60 mg  60 mg Intramuscular Once Mohammed Kindle, MD      . orphenadrine (NORFLEX) injection 60 mg  60 mg Intramuscular Once Mohammed Kindle, MD              Medications Prior to Admission  Medication Sig Dispense Refill  . cetirizine (ZYRTEC) 10 MG tablet Take 10 mg by mouth daily.      . Cholecalciferol (VITAMIN D3) 1000 units CAPS Take 1,000 Units by mouth daily.      Marland Kitchen DEXILANT 60 MG capsule TAKE ONE (1) CAPSULE EACH DAY FOR ACID REFLUX (Patient taking differently: TAKE 60 MG EACH DAY FOR ACID REFLUX) 30 capsule 3  . dicyclomine (BENTYL) 10 MG capsule TAKE 1 CAPSULE BY MOUTH 4 TIMES DAILY BEFORE MEALS AND AT BEDTIME (Patient taking differently: TAKE 10 MG BY MOUTH 4 TIMES DAILY BEFORE MEALS AND AT BEDTIME) 120 capsule 0  . losartan (COZAAR) 25 MG tablet Take 25 mg by mouth daily.       . metoprolol tartrate (LOPRESSOR) 25 MG tablet Take 0.5 tablets (12.5 mg total) by mouth 2 (two) times daily. 30  tablet 6  . naproxen (NAPROSYN) 250 MG tablet Take 250 mg by mouth 2 (two) times daily with a meal.      . oxyCODONE (OXY IR/ROXICODONE) 5 MG immediate release tablet Limit 1 tablet by mouth 2-3 times per day if tolerated (Patient taking differently: Take 5 mg by mouth See admin instructions. Limit 5 mg by mouth 2-3 times per day if tolerated) 90 tablet 0  . pregabalin (LYRICA) 25 MG capsule Limit  1 tablet by mouth per day or twice per day if tolerated (Patient taking differently: Take 25  mg by mouth See admin instructions. Take 25 mg by mouth per day or twice per day if tolerated) 60 capsule 0  . simvastatin (ZOCOR) 10 MG tablet Take 10 mg by mouth every evening.       Marland Kitchen aspirin EC 81 MG tablet Take 81 mg by mouth daily.      Marland Kitchen linaclotide (LINZESS) 145 MCG CAPS capsule Take 1 capsule (145 mcg total) by mouth daily before breakfast. (Patient not taking: Reported on 09/02/2017) 30 capsule 2  . omeprazole (PRILOSEC) 40 MG capsule Take 1 capsule (40 mg total) by mouth daily. (Patient not taking: Reported on 09/02/2017) 90 capsule 3  . Probiotic Product (VSL#3) CAPS Take 1 capsule by mouth 2 (two) times daily. 60 capsule 11  . sucralfate (CARAFATE) 1 GM/10ML suspension Take 10 mLs (1 g total) by mouth 4 (four) times daily. (Patient not taking: Reported on 09/02/2017) 420 mL 1      Drug Regimen Review  Drug regimen was reviewed and remains appropriate with no significant issues identified   Home: Home Living Family/patient expects to be discharged to:: Private residence Living Arrangements: Alone Available Help at Discharge: Family, Available PRN/intermittently Type of Home: House Home Access: Level entry Home Layout: One level Bathroom Shower/Tub: Chiropodist: Handicapped height Bathroom Accessibility: Yes Home Equipment: Sweetser - single point, Bedside commode   Functional History: Prior Function Level of Independence: Independent Comments: driving, independent with  everything   Functional Status:  Mobility: Bed Mobility Overal bed mobility: Needs Assistance Bed Mobility: Supine to Sit Supine to sit: Mod assist General bed mobility comments: Assist for LE advancement towards EOB. Pt rquired tactile cues to reach forward and flex trunk. Therapist assist to elevate trunk into full sitting position provided.  Transfers Overall transfer level: Needs assistance Equipment used: 2 person hand held assist Transfers: Sit to/from Stand, Stand Pivot Transfers Sit to Stand: +2 physical assistance, Min assist Stand pivot transfers: Mod assist General transfer comment: Patient required less assistance when determined to perform activity  but was unable to cognitively follow directions at all times and therefore required constant verbal and tactile cuing to perform transfers. Ambulation/Gait Ambulation/Gait assistance: Min guard Ambulation Distance (Feet): 5 Feet Assistive device: 2 person hand held assist Gait Pattern/deviations: Step-through pattern, Decreased stride length, Trunk flexed General Gait Details: Pt attempting to ambulate in room however was actively urinating while standing, and required assist to return to sitting for clean-up. Pt ambulated ~5 feet total.  Gait velocity: Decreased Gait velocity interpretation: Below normal speed for age/gender   ADL:   Cognition: Cognition Overall Cognitive Status: Impaired/Different from baseline Arousal/Alertness: Awake/alert Orientation Level: Disoriented to place, Disoriented to time, Disoriented to situation Attention: Sustained Sustained Attention: Impaired Sustained Attention Impairment: Verbal complex, Functional complex Awareness: Impaired Awareness Impairment: Intellectual impairment Problem Solving: Impaired Problem Solving Impairment: Functional basic, Verbal basic Behaviors: Restless Safety/Judgment: Impaired Cognition Arousal/Alertness: Awake/alert Behavior During Therapy:  Restless Overall Cognitive Status: Impaired/Different from baseline Area of Impairment: Orientation, Attention, Memory, Following commands, Safety/judgement, Awareness, Problem solving Orientation Level: Disoriented to, Person, Place, Time, Situation Current Attention Level: Sustained Memory: Decreased short-term memory Following Commands: Follows one step commands inconsistently, Follows one step commands with increased time Safety/Judgement: Decreased awareness of safety, Decreased awareness of deficits Awareness: Intellectual Problem Solving: Slow processing, Decreased initiation, Difficulty sequencing, Requires verbal cues, Requires tactile cues   Physical Exam: Blood pressure 110/68, pulse 62, temperature 98.6 F (37 C), temperature source Axillary, resp. rate 14, weight 67.3 kg (148 lb  5.9 oz), SpO2 100 %. Physical Exam  Vitals reviewed. Constitutional: She appears well-developed.  HENT:  Head: Normocephalic.  Eyes: EOM are normal.  Neck: Normal range of motion. Neck supple. No thyromegaly present.  Cardiovascular: Normal rate, regular rhythm and normal heart sounds.   Respiratory: Effort normal and breath sounds normal. No respiratory distress.  GI: Soft. Bowel sounds are normal. She exhibits no distension.  Skin. Warm and dry Neurological: She is alert.  Makes eye contact with examiner. Multiple paraphasic errors, as well as neologisms, rec>exp language deficits. She did follow some simple demonstrated commands but was inconsistent only following commands 10-20%. Was constantly laughing/in pleasant mood. RUE . Patient is able to imitate some gestures, raises her arms over her head is able to grip bilaterally. Overall manual muscle testing 4/5 in the upper extremities. Lower extremities. She does not follow commands sufficiently to do lower extremity manual muscle testing. Unable to assess sensation due to aphasia. She is unable to follow simple commands such as close your eyes or  touch her nose     Lab Results Last 48 Hours        Results for orders placed or performed during the hospital encounter of 09/01/17 (from the past 48 hour(s))  MRSA PCR Screening     Status: None    Collection Time: 09/01/17  9:01 PM  Result Value Ref Range    MRSA by PCR NEGATIVE NEGATIVE      Comment:        The GeneXpert MRSA Assay (FDA approved for NASAL specimens only), is one component of a comprehensive MRSA colonization surveillance program. It is not intended to diagnose MRSA infection nor to guide or monitor treatment for MRSA infections.    Glucose, capillary     Status: Abnormal    Collection Time: 09/01/17 11:43 PM  Result Value Ref Range    Glucose-Capillary 196 (H) 65 - 99 mg/dL  Hemoglobin A1c     Status: Abnormal    Collection Time: 09/02/17  8:05 AM  Result Value Ref Range    Hgb A1c MFr Bld 6.8 (H) 4.8 - 5.6 %      Comment: (NOTE) Pre diabetes:          5.7%-6.4% Diabetes:              >6.4% Glycemic control for   <7.0% adults with diabetes      Mean Plasma Glucose 148.46 mg/dL  Glucose, capillary     Status: Abnormal    Collection Time: 09/02/17  8:20 AM  Result Value Ref Range    Glucose-Capillary 150 (H) 65 - 99 mg/dL    Comment 1 Notify RN      Comment 2 Document in Chart    Glucose, capillary     Status: Abnormal    Collection Time: 09/02/17 11:34 AM  Result Value Ref Range    Glucose-Capillary 133 (H) 65 - 99 mg/dL    Comment 1 Notify RN      Comment 2 Document in Chart    Glucose, capillary     Status: None    Collection Time: 09/02/17  4:18 PM  Result Value Ref Range    Glucose-Capillary 98 65 - 99 mg/dL    Comment 1 Notify RN      Comment 2 Document in Chart    Glucose, capillary     Status: Abnormal    Collection Time: 09/02/17  9:30 PM  Result Value Ref Range    Glucose-Capillary 113 (  H) 65 - 99 mg/dL  CBC     Status: Abnormal    Collection Time: 09/03/17  2:20 AM  Result Value Ref Range    WBC 8.6 4.0 - 10.5 K/uL    RBC 3.99  3.87 - 5.11 MIL/uL    Hemoglobin 11.8 (L) 12.0 - 15.0 g/dL    HCT 36.1 36.0 - 46.0 %    MCV 90.5 78.0 - 100.0 fL    MCH 29.6 26.0 - 34.0 pg    MCHC 32.7 30.0 - 36.0 g/dL    RDW 13.1 11.5 - 15.5 %    Platelets 280 150 - 400 K/uL  Basic metabolic panel     Status: Abnormal    Collection Time: 09/03/17  2:20 AM  Result Value Ref Range    Sodium 138 135 - 145 mmol/L    Potassium 3.4 (L) 3.5 - 5.1 mmol/L    Chloride 105 101 - 111 mmol/L    CO2 24 22 - 32 mmol/L    Glucose, Bld 102 (H) 65 - 99 mg/dL    BUN 11 6 - 20 mg/dL    Creatinine, Ser 0.87 0.44 - 1.00 mg/dL    Calcium 9.5 8.9 - 10.3 mg/dL    GFR calc non Af Amer >60 >60 mL/min    GFR calc Af Amer >60 >60 mL/min      Comment: (NOTE) The eGFR has been calculated using the CKD EPI equation. This calculation has not been validated in all clinical situations. eGFR's persistently <60 mL/min signify possible Chronic Kidney Disease.      Anion gap 9 5 - 15       Imaging Results (Last 48 hours)  Dg Pelvis 1-2 Views   Result Date: 09/01/2017 CLINICAL DATA:  Possible fall. EXAM: PELVIS - 1-2 VIEW COMPARISON:  Radiograph dated 12/25/2016 FINDINGS: There is no evidence of pelvic fracture or diastasis. No pelvic bone lesions are seen. Left total hip prosthesis. Fusion hardware in the lower lumbar spine. IMPRESSION: No acute abnormalities. Electronically Signed   By: Lorriane Shire M.D.   On: 09/01/2017 12:04    Ct Head Wo Contrast   Result Date: 09/03/2017 CLINICAL DATA:  76 y/o  F; intracranial hemorrhage for follow-up. EXAM: CT HEAD WITHOUT CONTRAST TECHNIQUE: Contiguous axial images were obtained from the base of the skull through the vertex without intravenous contrast. COMPARISON:  09/02/2017 CT of the head FINDINGS: Brain: Stable acute hemorrhage within the left temporal lobe measuring 41 x 24 x 22 mm (AP x ML x CC series 3, image 11 and series 6, image 45). Stable thin subdural hematoma over the left cerebral convexity (series 5, image  43). Stable associated edema and mass effect with 5 mm left-to-right midline shift. No evidence for new acute intracranial hemorrhage or infarction. Stable background of chronic microvascular ischemic changes and parenchymal volume loss of the brain. No hydrocephalus or effacement of basilar cisterns. Vascular: Calcific atherosclerosis of carotid siphons. Skull: Normal. Negative for fracture or focal lesion. Sinuses/Orbits: No acute finding. Other: None. IMPRESSION: 1. Stable size of left temporal lobe parenchymal hematoma and trace left-sided subdural hematoma. Stable mass effect with 5 mm left-to-right midline shift. 2. No new acute intracranial abnormality. Electronically Signed   By: Kristine Garbe M.D.   On: 09/03/2017 06:05    Ct Head Wo Contrast   Result Date: 09/02/2017 CLINICAL DATA:  Followup intracranial hemorrhage. EXAM: CT HEAD WITHOUT CONTRAST TECHNIQUE: Contiguous axial images were obtained from the base of the skull through the  vertex without intravenous contrast. COMPARISON:  CT HEAD September 01, 2017 FINDINGS: Moderately motion degraded examination. BRAIN: Evolving 4 x 1 x 2.4 cm LEFT temporal intraparenchymal hematoma, relatively unchanged with surrounding low-density vasogenic edema. Regional mass effect. 5 mm LEFT-to-RIGHT midline shift. No ventricular entrapment. No hydrocephalus. No acute large vascular territory infarct. Patchy supratentorial Shavers matter hypodensities compatible with mild chronic small vessel ischemic disease, less than expected for age. Trace possible LEFT subdural hematoma versus motion artifact. Basal cisterns are patent. VASCULAR: Mild calcific atherosclerosis of the carotid siphons. SKULL: No skull fracture. No significant scalp soft tissue swelling. SINUSES/ORBITS: The mastoid air-cells and included paranasal sinuses are well-aerated.The included ocular globes and orbital contents are non-suspicious. OTHER: None. IMPRESSION: 1. Moderately motion degraded  examination. 2. Evolving LEFT temporal lobe hematoma, relatively stable. 5 mm LEFT to RIGHT midline shift without ventricular entrapment. 3. Trace LEFT holo hemispheric subdural hematoma versus motion artifact. Electronically Signed   By: Elon Alas M.D.   On: 09/02/2017 05:56    Ct Head Wo Contrast   Result Date: 09/01/2017 CLINICAL DATA:  Found down this morning. Unknown if patient fell. Focal neuro deficit greater than 6 hours, stroke suspected. EXAM: CT HEAD WITHOUT CONTRAST CT CERVICAL SPINE WITHOUT CONTRAST TECHNIQUE: Multidetector CT imaging of the head and cervical spine was performed following the standard protocol without intravenous contrast. Multiplanar CT image reconstructions of the cervical spine were also generated. COMPARISON:  CT head 06/21/2017.  CT cervical spine 06/14/2013. FINDINGS: CT HEAD FINDINGS Brain: There is a large acute hematoma posteriorly in the left temporal lobe, measuring up to 5.1 x 2.4 x 2.6 cm. This contains a fluid- fluid level and is associated with surrounding vasogenic edema. There is mild resulting local mass effect, but no focal herniation or hydrocephalus. There is no extra-axial fluid collection or midline shift. Mild underlying chronic small vessel ischemic changes are present in the periventricular Granquist matter. Vascular: Mild intracranial vascular calcifications. Skull: Intact without acute findings. Sinuses/Orbits: The visualized paranasal sinuses, mastoid air cells and middle ears are clear. The orbital contents appear unremarkable. Other: None. CT CERVICAL SPINE FINDINGS Alignment: Mild cervicothoracic scoliosis. No evidence of traumatic subluxation. Skull base and vertebrae: No evidence of acute cervical spine fracture. Soft tissues and spinal canal: No prevertebral fluid or swelling. No visible canal hematoma. Disc levels: Minimal spondylosis for age. No high-grade spinal stenosis. Upper chest: Unremarkable. Other: None. IMPRESSION: 1. Large acute  left temporal lobe hematoma (approximately 15.9 ml), atypical for hypertensive hemorrhage. Consider hypertensive stroke and amyloid angiopathy. 2. No evidence of acute cervical spine fracture, traumatic subluxation or static signs of instability. 3. Critical Value/emergent results were called by telephone at the time of interpretation on 09/01/2017 at 11:45 am to Dr. Lavonia Drafts , who verbally acknowledged these results. Electronically Signed   By: Richardean Sale M.D.   On: 09/01/2017 12:02    Ct Cervical Spine Wo Contrast   Result Date: 09/01/2017 CLINICAL DATA:  Found down this morning. Unknown if patient fell. Focal neuro deficit greater than 6 hours, stroke suspected. EXAM: CT HEAD WITHOUT CONTRAST CT CERVICAL SPINE WITHOUT CONTRAST TECHNIQUE: Multidetector CT imaging of the head and cervical spine was performed following the standard protocol without intravenous contrast. Multiplanar CT image reconstructions of the cervical spine were also generated. COMPARISON:  CT head 06/21/2017.  CT cervical spine 06/14/2013. FINDINGS: CT HEAD FINDINGS Brain: There is a large acute hematoma posteriorly in the left temporal lobe, measuring up to 5.1 x 2.4 x 2.6 cm. This  contains a fluid- fluid level and is associated with surrounding vasogenic edema. There is mild resulting local mass effect, but no focal herniation or hydrocephalus. There is no extra-axial fluid collection or midline shift. Mild underlying chronic small vessel ischemic changes are present in the periventricular Berkowitz matter. Vascular: Mild intracranial vascular calcifications. Skull: Intact without acute findings. Sinuses/Orbits: The visualized paranasal sinuses, mastoid air cells and middle ears are clear. The orbital contents appear unremarkable. Other: None. CT CERVICAL SPINE FINDINGS Alignment: Mild cervicothoracic scoliosis. No evidence of traumatic subluxation. Skull base and vertebrae: No evidence of acute cervical spine fracture. Soft tissues  and spinal canal: No prevertebral fluid or swelling. No visible canal hematoma. Disc levels: Minimal spondylosis for age. No high-grade spinal stenosis. Upper chest: Unremarkable. Other: None. IMPRESSION: 1. Large acute left temporal lobe hematoma (approximately 15.9 ml), atypical for hypertensive hemorrhage. Consider hypertensive stroke and amyloid angiopathy. 2. No evidence of acute cervical spine fracture, traumatic subluxation or static signs of instability. 3. Critical Value/emergent results were called by telephone at the time of interpretation on 09/01/2017 at 11:45 am to Dr. Lavonia Drafts , who verbally acknowledged these results. Electronically Signed   By: Richardean Sale M.D.   On: 09/01/2017 12:02             Medical Problem List and Plan: 1.  Right hemiparesis and receptive expressive language deficits secondary to left temporal lobe hemorrhage secondary to hypertensive crisis CIR PT, OT, SLP 2.  DVT Prophylaxis/Anticoagulation: SCDs 3. Pain Management/chronic low back pain: Lyrica 25 mg twice a day 4. Mood: Provide emotional support 5. Neuropsych: This patient is capable of making decisions on her own behalf. 6. Skin/Wound Care: Routine skin checks 7. Fluids/Electrolytes/Nutrition: Routine I&O with follow-up chemistries 8. Hypertension/PAF. Cozaar 25 mg daily, Lopressor 12.5 mg twice a day.Followed by Dr.Gollan. Monitor with increased mobility. Cardiac rate control 9. Diabetes mellitus with peripheral neuropathy. Hemoglobin A1c 6.8. SSI. Check blood sugars before meals and at bedtime Diet controlled PTA, monitor 10. AKI. Creatinine mildly elevated at 1.19. Follow-up chemistries. Encourage fluids 10. Hyperlipidemia. Zocor 11. History of GI bleed. Continue Protonix     Post Admission Physician Evaluation: 1. Functional deficits secondary  to left temporal intracranial hemorrhage. 2. Patient is admitted to receive collaborative, interdisciplinary care between the physiatrist, rehab  nursing staff, and therapy team. 3. Patient's level of medical complexity and substantial therapy needs in context of that medical necessity cannot be provided at a lesser intensity of care such as a SNF. 4. Patient has experienced substantial functional loss from his/her baseline which was documented above under the "Functional History" and "Functional Status" headings.  Judging by the patient's diagnosis, physical exam, and functional history, the patient has potential for functional progress which will result in measurable gains while on inpatient rehab.  These gains will be of substantial and practical use upon discharge  in facilitating mobility and self-care at the household level. 5. Physiatrist will provide 24 hour management of medical needs as well as oversight of the therapy plan/treatment and provide guidance as appropriate regarding the interaction of the two. 6. The Preadmission Screening has been reviewed and patient status is unchanged unless otherwise stated above. 7. 24 hour rehab nursing will assist with bladder management, bowel management, safety, skin/wound care, disease management, medication administration, pain management and patient education  and help integrate therapy concepts, techniques,education, etc. 8. PT will assess and treat for/with: pre gait, gait training, endurance , safety, equipment, neuromuscular re education.   Goals are: Sup.  9. OT will assess and treat for/with: ADLs, Cognitive perceptual skills, Neuromuscular re education, safety, endurance, equipment.   Goals are: Mod I. Therapy may proceed with showering this patient. 10. SLP will assess and treat for/with: Comprehension, express basic needs, follow basic commands,.  Goals are: Supervision. 11. Case Management and Social Worker will assess and treat for psychological issues and discharge planning. 12. Team conference will be held weekly to assess progress toward goals and to determine barriers to  discharge. 13. Patient will receive at least 3 hours of therapy per day at least 5 days per week. 14. ELOS: 7 days       15. Prognosis:   good for physical recovery, recovery for aphasia will be more protracted          Charlett Blake M.D. [facility] FAAPM&R (Sports Med, Neuromuscular Med) Diplomate Am Board of Electrodiagnostic Med  Cathlyn Parsons., PA-C 09/03/2017

## 2017-09-05 ENCOUNTER — Inpatient Hospital Stay (HOSPITAL_COMMUNITY): Payer: Medicare Other

## 2017-09-05 ENCOUNTER — Inpatient Hospital Stay (HOSPITAL_COMMUNITY): Payer: Medicare Other | Admitting: Physical Therapy

## 2017-09-05 ENCOUNTER — Inpatient Hospital Stay (HOSPITAL_COMMUNITY): Payer: Medicare Other | Admitting: Speech Pathology

## 2017-09-05 LAB — COMPREHENSIVE METABOLIC PANEL
ALBUMIN: 3.3 g/dL — AB (ref 3.5–5.0)
ALK PHOS: 83 U/L (ref 38–126)
ALT: 16 U/L (ref 14–54)
ANION GAP: 7 (ref 5–15)
AST: 18 U/L (ref 15–41)
BUN: 13 mg/dL (ref 6–20)
CALCIUM: 9.2 mg/dL (ref 8.9–10.3)
CHLORIDE: 103 mmol/L (ref 101–111)
CO2: 28 mmol/L (ref 22–32)
Creatinine, Ser: 1.01 mg/dL — ABNORMAL HIGH (ref 0.44–1.00)
GFR calc Af Amer: >60 mL/min (ref >60)
GFR calc non Af Amer: 52 mL/min — ABNORMAL LOW (ref >60)
GLUCOSE: 115 mg/dL — AB (ref 65–99)
POTASSIUM: 3.8 mmol/L (ref 3.5–5.1)
SODIUM: 138 mmol/L (ref 135–145)
Total Bilirubin: 0.9 mg/dL (ref 0.3–1.2)
Total Protein: 6.4 g/dL — ABNORMAL LOW (ref 6.5–8.1)

## 2017-09-05 LAB — CBC WITH DIFFERENTIAL/PLATELET
BASOS PCT: 0 %
Basophils Absolute: 0 10*3/uL (ref 0.0–0.1)
Eosinophils Absolute: 0.2 10*3/uL (ref 0.0–0.7)
Eosinophils Relative: 3 %
HEMATOCRIT: 32.9 % — AB (ref 36.0–46.0)
HEMOGLOBIN: 10.6 g/dL — AB (ref 12.0–15.0)
LYMPHS ABS: 1.4 10*3/uL (ref 0.7–4.0)
LYMPHS PCT: 20 %
MCH: 29 pg (ref 26.0–34.0)
MCHC: 32.2 g/dL (ref 30.0–36.0)
MCV: 89.9 fL (ref 78.0–100.0)
MONOS PCT: 8 %
Monocytes Absolute: 0.5 10*3/uL (ref 0.1–1.0)
NEUTROS ABS: 4.9 10*3/uL (ref 1.7–7.7)
NEUTROS PCT: 69 %
Platelets: 260 10*3/uL (ref 150–400)
RBC: 3.66 MIL/uL — ABNORMAL LOW (ref 3.87–5.11)
RDW: 12.6 % (ref 11.5–15.5)
WBC: 7.1 10*3/uL (ref 4.0–10.5)

## 2017-09-05 LAB — GLUCOSE, CAPILLARY
GLUCOSE-CAPILLARY: 146 mg/dL — AB (ref 65–99)
GLUCOSE-CAPILLARY: 127 mg/dL — AB (ref 65–99)
GLUCOSE-CAPILLARY: 110 mg/dL — AB (ref 65–99)
Glucose-Capillary: 91 mg/dL (ref 65–99)

## 2017-09-05 MED ORDER — TRAZODONE HCL 50 MG PO TABS
50.0000 mg | ORAL_TABLET | Freq: Every evening | ORAL | Status: DC | PRN
  Administered 2017-09-05 – 2017-09-07 (×3): 50 mg via ORAL
  Filled 2017-09-05 (×3): qty 1

## 2017-09-05 NOTE — Evaluation (Signed)
Physical Therapy Assessment and Plan  Patient Details  Name: XITLALY AULT MRN: [id number] Date of Birth: 1941-11-24  PT Diagnosis: Difficulty walking and Impaired cognition Rehab Potential: Fair ELOS: 7-10   Today's Date: 09/05/2017 PT Individual Time: 1324-4010 PT Individual Time Calculation (min): 58 min    Problem List:  Patient Active Problem List   Diagnosis Date Noted  . Gait disturbance, post-stroke 09/04/2017  . Wernicke's aphasia 09/04/2017  . 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 However, needs to rule out CAA, tumor or CVT (vein of labbe)  09/01/2017  . Constipation 01/06/2017  . DJD (degenerative joint disease) of knee 07/18/2016  . GIB (gastrointestinal bleeding) 05/25/2016  . GI bleed 05/24/2016  . Degenerative joint disease (DJD) of hip 05/08/2016  . 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 Hypertensive heart disease   . Hemorrhage of gastrointestinal tract 11/13/2015  . Lumbosacral facet joint syndrome ([place]) 07/26/2015  . Angioedema 07/13/2015  . DM2 (diabetes mellitus, type 2) ([place]) 07/13/2015  . Status post lumbar laminectomy 06/19/2015  . Lumbar radiculopathy 06/19/2015  . DDD (degenerative disc disease), lumbar 05/11/2015  . Facet syndrome, lumbar ([place]) 05/11/2015  . Lumbar post-laminectomy syndrome 05/11/2015  . Sacroiliac joint disease 05/11/2015  . 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 Neuropathy due to secondary diabetes ([place]) 05/11/2015  . Essential hypertension 01/03/2015  . Coronary atherosclerosis of native coronary artery   . PAF (paroxysmal atrial fibrillation) ([place]) 10/20/2014  . Ischemic colitis ([place]) 10/20/2014  . Diabetes mellitus type 2 with complications ([place]) 27/25/3664  . COPD (chronic obstructive pulmonary disease) ([place]) 10/20/2014  . Hyperlipidemia 10/20/2014  . GERD (gastroesophageal reflux disease)  10/20/2014  . Ingrowing nail, right great toe 04/27/2014  . Internal hemorrhoid, bleeding 07/14/2013    Past Medical History:  Past Medical History:  Diagnosis Date  . Asthma   . Chronic lower back pain   . COPD (chronic obstructive pulmonary disease) ([place])   . Diabetes mellitus without complication ([place])   . Gastrointestinal bleed   . GERD (gastroesophageal reflux disease)   . History of colon polyps   . Hypertension   . Hypertensive heart disease   . IBS (irritable bowel syndrome)   . Ischemic colitis ([place])   . Non-obstructive Coronary Artery Disease    a. 05/2009 Cath [facility]): minimal nonobs dzs.  . Osteoarthritis   . PAF (paroxysmal atrial fibrillation) ([place])    a. 09/2014 during GI illness;  b. CHA2DS2VASc = 5 (not currently on [place] 2/2 h/o GIB/ischemic colitis); c. 09/2014 Echo: EF 60-65%, imparied relaxation, nl RV size/fxn.  . Transient cerebral ischemia due to atrial fibrillation [facility])    Past Surgical History:  Past Surgical History:  Procedure Laterality Date  . ABDOMINAL HYSTERECTOMY    . APPENDECTOMY    . BACK SURGERY     x 3, last 2004.  . CHOLECYSTECTOMY    . COLONOSCOPY WITH PROPOFOL N/A 03/20/2017   Procedure: COLONOSCOPY WITH PROPOFOL;  Surgeon: Jonathon Bellows, MD;  Location: [facility] ENDOSCOPY;  Service: Endoscopy;  Laterality: N/A;  . ESOPHAGOGASTRODUODENOSCOPY (EGD) WITH PROPOFOL N/A 03/20/2017   Procedure: ESOPHAGOGASTRODUODENOSCOPY (EGD) WITH PROPOFOL;  Surgeon: Jonathon Bellows, MD;  Location: [facility] ENDOSCOPY;  Service: Endoscopy;  Laterality: N/A;  . FOOT SURGERY Right   . HEMORRHOID BANDING    . left total hip arthroplasty  2012  . STOMACH SURGERY    . TONSILLECTOMY  Assessment & Plan Clinical Impression: MARJORIA MANCILLAS is a 76 y.o.right handed  female with history of hypertension, diabetes mellitus, COPD.  Admitted 09/01/2017 with altered mental status right side weakness as well as aphasia. Blood pressure elevated 735 systolic and was started on nicardipine  drip. CT of the head showed a large acute left temporal lobe hematoma approximately 15.9 mL suspect hypertensive hemorrhage. CT cervical spine negative. Neurology follow-up with conservative care monitoring of blood pressure. Echocardiogram with ejection fraction 32% grade 1 diastolic dysfunction. Follow-up CT of head 09/02/2017 stable noted 5 mm left-to-right midline shift without ventricular entrapment and again repeated 09/03/2017 showing stable size of left temporal lobe parenchymal hematoma stable mass effect and no new acute intracranial abnormalities. Mechanical soft diet. Dysphagia 2 diet with thin liquids.  Patient transferred to CIR on 09/04/2017 .   Patient currently requires min with mobility secondary to decreased attention, decreased awareness, decreased problem solving, decreased safety awareness, decreased memory and delayed processing and decreased standing balance, decreased postural control and decreased balance strategies.  Prior to hospitalization, patient was independent  with mobility and lived with Alone in a House home.  Home access is  Level entry.  Patient will benefit from skilled PT intervention to maximize safe functional mobility, minimize fall risk and decrease caregiver burden for planned discharge home with 24 hour supervision.  Anticipate patient will benefit from follow up [place] at discharge.  PT - End of Session Activity Tolerance: Tolerates 30+ min activity without fatigue PT Assessment Rehab Potential (ACUTE/IP ONLY): Fair PT Barriers to Discharge: Other (comments) PT Barriers to Discharge Comments: globally aphasic  PT Patient demonstrates impairments in the following area(s): Balance;Endurance;Safety PT Transfers Functional Problem(s): Bed Mobility;Bed to Chair;Car;Furniture PT Locomotion Functional Problem(s): Stairs;Ambulation PT Plan PT Intensity: Minimum of 1-2 x/day ,45 to 90 minutes PT Frequency: 5 out of 7 days PT Duration Estimated Length of Stay:  7-10 PT Treatment/Interventions: Ambulation/gait training;Balance/vestibular training;Cognitive remediation/compensation;DME/adaptive equipment instruction;Discharge planning;Functional mobility training;Neuromuscular re-education;Patient/family education;Stair training;Psychosocial support;Therapeutic Activities;Therapeutic Exercise;UE/LE Strength taining/ROM;UE/LE Coordination activities;Visual/perceptual remediation/compensation PT Transfers Anticipated Outcome(s): supervision PT Locomotion Anticipated Outcome(s): supervision PT Recommendation Follow Up Recommendations: Home health PT;24 hour supervision/assistance Patient destination: Home Equipment Recommended: None recommended by PT  Skilled Therapeutic Intervention No c/o pain.  PT instructed pt in PT evaluation and provided education on role of therapy, goals of care, safety plan, and expected length of stay.  Pt limited by severe global aphasia, however requiring only min guard>min assist for all mobility at this time.  Able to count coins to 12 with min cues, but unable to cease counting at designated number.  Pt returns to room at end of session and positioned with QRB in place, call bell in reach and needs met.   PT Evaluation Precautions/Restrictions Precautions Precautions: Fall Precaution Comments: QRB in w/c or recliner, impaired cognition and globally aphasic Restrictions Weight Bearing Restrictions: No General Pain Pain Assessment Pain Assessment: Faces Faces Pain Scale: No hurt Home Living/Prior Functioning Home Living Available Help at Discharge: Available 24 hours/day;Family Type of Home: House Home Access: Level entry Home Layout: One level Additional Comments: all history taken from chart review, pt globally aphasic and no family present to determine home situation or PLOF  Lives With: Alone Prior Function Level of Independence: Independent with gait;Independent with transfers  Able to Take Stairs?:  Yes 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 - Assessment Additional Comments: difficult to assess 2/2 cognitive deficits Perception Perception: Impaired Praxis Praxis: Impaired  Cognition Overall Cognitive Status:  Impaired/Different from baseline Arousal/Alertness: Awake/alert Orientation Level:  (able to state name, but due to aphasia unable to answer any other orientation questions) Attention: Sustained Sustained Attention: Impaired Sustained Attention Impairment: Verbal basic;Functional basic Awareness: Impaired Awareness Impairment: Intellectual impairment Problem Solving: Impaired Problem Solving Impairment: Functional basic;Verbal basic Safety/Judgment: Impaired Sensation Sensation Additional Comments: difficult to assess 2/2 aphasia and cognitive deficits Coordination Gross Motor Movements are Fluid and Coordinated: Yes Fine Motor Movements are Fluid and Coordinated: Yes Motor  Motor Motor: Within Functional Limits  Mobility Bed Mobility Bed Mobility: Supine to Sit Supine to Sit: 5: Supervision;With rails Transfers Transfers: Yes Sit to Stand: 4: Min guard Stand to Sit: 4: Min guard Locomotion  Ambulation Ambulation: Yes Ambulation/Gait Assistance: 4: Min assist Ambulation Distance (Feet): 400 Feet Assistive device: 1 person hand held assist Ambulation/Gait Assistance Details: visual cues for path finding Gait Gait: Yes Gait Pattern: Trunk flexed Stairs / Additional Locomotion Stairs: Yes Stairs Assistance: 4: Min assist Stair Management Technique: Two rails Number of Stairs: 8 Height of Stairs: 3 Ramp: 4: Min Administrator Mobility: No  Trunk/Postural Assessment  Cervical Assessment Cervical Assessment: Within Functional Limits Thoracic Assessment Thoracic Assessment: Within Functional Limits Lumbar Assessment Lumbar Assessment: Within Functional Limits Postural Control Postural Control:  Deficits on evaluation Protective Responses: delayed  Balance Balance Balance Assessed: Yes Static Standing Balance Static Standing - Balance Support: During functional activity Static Standing - Level of Assistance: 4: Min assist Dynamic Standing Balance Dynamic Standing - Balance Support: During functional activity Dynamic Standing - Level of Assistance: 4: Min assist Extremity Assessment      RLE Strength RLE Overall Strength Comments: unable to formally assess due to global aphasia, but no apparent deficits noted with functional mobility LLE Strength LLE Overall Strength Comments: unable to formally asses 2/2 global asphasia, however no apparent deficits noted with functional mobility   See Function Navigator for Current Functional Status.   Refer to Care Plan for Long Term Goals  Recommendations for other services: None   Discharge Criteria: Patient will be discharged from PT if patient refuses treatment 3 consecutive times without medical reason, if treatment goals not met, if there is a change in medical status, if patient makes no progress towards goals or if patient is discharged from hospital.  The above assessment, treatment plan, treatment alternatives and goals were discussed and mutually agreed upon: No family available/patient unable  Michel Santee 09/05/2017, 9:44 AM

## 2017-09-05 NOTE — Progress Notes (Signed)
Subjective/Complaints:   Objective: Vital Signs: Blood pressure (!) 169/83, pulse 78, temperature 98.5 F (36.9 C), temperature source Oral, resp. rate 18, height  (1.676 m), weight 70.3 kg (155 lb), SpO2 100 %. Dg Chest Port 1 View  Result Date: 09/04/2017 CLINICAL DATA:  Cough EXAM: PORTABLE CHEST 1 VIEW COMPARISON:  CT chest 06/22/2017 FINDINGS: The heart size and mediastinal contours are within normal limits. Both lungs are clear. The visualized skeletal structures are unremarkable. IMPRESSION: No active disease. Electronically Signed   By: Elige Ko   On: 09/04/2017 09:34   Results for orders placed or performed during the hospital encounter of 09/04/17 (from the past 72 hour(s))  Glucose, capillary     Status: Abnormal   Collection Time: 09/04/17  4:57 PM  Result Value Ref Range   Glucose-Capillary 126 (H) 65 - 99 mg/dL   Comment 1 Notify RN   Glucose, capillary     Status: None   Collection Time: 09/04/17  9:21 PM  Result Value Ref Range   Glucose-Capillary 75 65 - 99 mg/dL   Comment 1 Notify RN   CBC WITH DIFFERENTIAL     Status: Abnormal   Collection Time: 09/05/17  5:49 AM  Result Value Ref Range   WBC 7.1 4.0 - 10.5 K/uL   RBC 3.66 (L) 3.87 - 5.11 MIL/uL   Hemoglobin 10.6 (L) 12.0 - 15.0 g/dL   HCT 95.6 (L) 21.3 - 08.6 %   MCV 89.9 78.0 - 100.0 fL   MCH 29.0 26.0 - 34.0 pg   MCHC 32.2 30.0 - 36.0 g/dL   RDW 57.8 46.9 - 62.9 %   Platelets 260 150 - 400 K/uL   Neutrophils Relative % 69 %   Neutro Abs 4.9 1.7 - 7.7 K/uL   Lymphocytes Relative 20 %   Lymphs Abs 1.4 0.7 - 4.0 K/uL   Monocytes Relative 8 %   Monocytes Absolute 0.5 0.1 - 1.0 K/uL   Eosinophils Relative 3 %   Eosinophils Absolute 0.2 0.0 - 0.7 K/uL   Basophils Relative 0 %   Basophils Absolute 0.0 0.0 - 0.1 K/uL     HEENT: normal Cardio: RRR and no murmur Resp: CTA B/L and unlabored GI: BS positive and NT, ND Extremity:  No Edema Skin:   Intact Neuro: Confused, Normal Motor and  Aphasic Musc/Skel:  Other no pain with UE ROM Gen NAD   Assessment/Plan: 1. 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 Physiatrist is providing close team supervision and 24 hour management of active medical problems listed below. Physiatrist and rehab team continue to assess barriers to discharge/monitor patient progress toward functional and medical goals. FIM:       Function - Toileting Toileting steps completed by patient: Adjust clothing prior to toileting, Adjust clothing after toileting Assist level: Supervision or verbal cues           Function - Comprehension Comprehension: Auditory Comprehension assist level: Understands basic less than 25% of the time/ requires cueing >75% of the time  Function - Expression Expression: Verbal Expression assist level:  (not able to vocalize needs she does notunderstand requires c)  Function - Social Interaction Social Interaction assist level: Interacts appropriately 25 - 49% of time - Needs frequent redirection.  Function - Problem Solving Problem solving assist level: Solves basic less than 25% of the time - needs direction nearly all the time or does  not effectively solve problems and may need a restraint for safety  Function - Memory Patient normally able to recall (first 3 days only): None of the above  Medical Problem List and Plan: 1. Right hemiparesis and receptive expressive language deficitssecondary to left temporal lobe hemorrhage secondary to hypertensive crisis CIR PT, OT, SLP evals 2. DVT Prophylaxis/Anticoagulation: SCDs- no anticoagulants due to bleed 3. Pain Management/chronic low back pain: Lyrica 25 mg twice a day 4. Mood: Provide emotional support 5. Neuropsych: This patient iscapable of making decisions on herown behalf. 6. Skin/Wound Care: Routine skin checks 7.  Fluids/Electrolytes/Nutrition: Routine I&O with follow-up chemistries 8.Hypertension/PAF. Cozaar 25 mg daily, Lopressor 12.5 mg twice a day.Followed by Dr.Gollan.Monitor with increased mobility.Cardiac rate control Vitals:   09/04/17 1410 09/04/17 1941  BP: (!) 142/93 (!) 169/83  Pulse: 78 78  Resp: 17 18  Temp: 99.3 F (37.4 C) 98.5 F (36.9 C)  SpO2: 100% 100%   9.Diabetes mellitus with peripheral neuropathy. Hemoglobin A1c 6.8. SSI.Check blood sugars before meals and at bedtime Diet controlled PTA, monitor 10.AKI.Creatinine mildly elevated at 1.19. Follow-up chemistries. Encourage fluids 10.Hyperlipidemia. Zocor 11. History of GI bleed. Continue Protonix , Hgb 10.6, stable vs 9/13 LOS (Days) 1 A FACE TO FACE EVALUATION WAS PERFORMED  KIRSTEINS,ANDREW E 09/05/2017, 6:35 AM

## 2017-09-05 NOTE — Evaluation (Signed)
Occupational Therapy Assessment and Plan  Patient Details  Name: Yvette Collier MRN: [id number] Date of Birth: 01/22/1941  OT Diagnosis: apraxia, cognitive deficits, disturbance of vision and muscle weakness (generalized) Rehab Potential: Rehab Potential (ACUTE ONLY): Fair ELOS: 6-8   Today's Date: 09/05/2017 OT Individual Time: 7672-0947 OT Individual Time Calculation (min): 75 min     Problem List:  Patient Active Problem List   Diagnosis Date Noted  . Gait disturbance, post-stroke 09/04/2017  . Wernicke's aphasia 09/04/2017  . 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 However, needs to rule out CAA, tumor or CVT (vein of labbe)  09/01/2017  . Constipation 01/06/2017  . DJD (degenerative joint disease) of knee 07/18/2016  . GIB (gastrointestinal bleeding) 05/25/2016  . GI bleed 05/24/2016  . Degenerative joint disease (DJD) of hip 05/08/2016  . 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 Hypertensive heart disease   . Hemorrhage of gastrointestinal tract 11/13/2015  . Lumbosacral facet joint syndrome ([place]) 07/26/2015  . Angioedema 07/13/2015  . DM2 (diabetes mellitus, type 2) ([place]) 07/13/2015  . Status post lumbar laminectomy 06/19/2015  . Lumbar radiculopathy 06/19/2015  . DDD (degenerative disc disease), lumbar 05/11/2015  . Facet syndrome, lumbar ([place]) 05/11/2015  . Lumbar post-laminectomy syndrome 05/11/2015  . Sacroiliac joint disease 05/11/2015  . 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 Neuropathy due to secondary diabetes ([place]) 05/11/2015  . Essential hypertension 01/03/2015  . Coronary atherosclerosis of native coronary artery   . PAF (paroxysmal atrial fibrillation) ([place]) 10/20/2014  . Ischemic colitis ([place]) 10/20/2014  . Diabetes mellitus type 2 with complications ([place]) 09/62/8366  . COPD (chronic obstructive pulmonary disease) ([place]) 10/20/2014   . Hyperlipidemia 10/20/2014  . GERD (gastroesophageal reflux disease) 10/20/2014  . Ingrowing nail, right great toe 04/27/2014  . Internal hemorrhoid, bleeding 07/14/2013    Past Medical History:  Past Medical History:  Diagnosis Date  . Asthma   . Chronic lower back pain   . COPD (chronic obstructive pulmonary disease) ([place])   . Diabetes mellitus without complication ([place])   . Gastrointestinal bleed   . GERD (gastroesophageal reflux disease)   . History of colon polyps   . Hypertension   . Hypertensive heart disease   . IBS (irritable bowel syndrome)   . Ischemic colitis ([place])   . Non-obstructive Coronary Artery Disease    a. 05/2009 Cath [facility]): minimal nonobs dzs.  . Osteoarthritis   . PAF (paroxysmal atrial fibrillation) ([place])    a. 09/2014 during GI illness;  b. CHA2DS2VASc = 5 (not currently on [place] 2/2 h/o GIB/ischemic colitis); c. 09/2014 Echo: EF 60-65%, imparied relaxation, nl RV size/fxn.  . Transient cerebral ischemia due to atrial fibrillation [facility])    Past Surgical History:  Past Surgical History:  Procedure Laterality Date  . ABDOMINAL HYSTERECTOMY    . APPENDECTOMY    . BACK SURGERY     x 3, last 2004.  . CHOLECYSTECTOMY    . COLONOSCOPY WITH PROPOFOL N/A 03/20/2017   Procedure: COLONOSCOPY WITH PROPOFOL;  Surgeon: Jonathon Bellows, MD;  Location: [facility] ENDOSCOPY;  Service: Endoscopy;  Laterality: N/A;  . ESOPHAGOGASTRODUODENOSCOPY (EGD) WITH PROPOFOL N/A 03/20/2017   Procedure: ESOPHAGOGASTRODUODENOSCOPY (EGD) WITH PROPOFOL;  Surgeon: Jonathon Bellows, MD;  Location: [facility] ENDOSCOPY;  Service: Endoscopy;  Laterality: N/A;  . FOOT SURGERY Right   . HEMORRHOID BANDING    . left total hip arthroplasty  2012  . STOMACH  SURGERY    . TONSILLECTOMY      Assessment & Plan Clinical Impression: Yvette Collier is a 76 y.o.right handed  female with history of hypertension, [name], diabetes mellitus, COPD. Patient lives alone independent  prior to admission and works as a Theme park manager. One level home. Admitted 09/01/2017 with altered mental status right side weakness as well as aphasia. Blood pressure elevated 580 systolic and was started on nicardipine drip. CT of the head showed a large acute left temporal lobe hematoma approximately 15.9 mL suspect hypertensive hemorrhage. CT cervical spine negative. Neurology follow-up with conservative care monitoring of blood pressure. Echocardiogram with ejection fraction 99% grade 1 diastolic dysfunction. Follow-up CT of head 09/02/2017 stable noted 5 mm left-to-right midline shift without ventricular entrapment and again repeated 09/03/2017 showing stable size of left temporal lobe parenchymal hematoma stable mass effect and no new acute intracranial abnormalities. Mild elevation in creatinine 1.19 from baseline 0.87 and monitor. Mechanical soft diet. Physical therapy evaluation completed 09/02/2017 with recommendations of physical medicine rehabilitation consult. Patient was admitted for a comprehensive rehabilitation program. .    Patient currently requires min with basic self-care skills secondary to muscle weakness, decreased cardiorespiratoy endurance, motor apraxia, decreased coordination and decreased motor planning, decreased motor planning and ideational apraxia and decreased initiation, decreased attention, decreased awareness, decreased problem solving, decreased safety awareness and decreased memory.  Prior to hospitalization, patient could complete BADL with modified independent .  Patient will benefit from skilled intervention to decrease level of assist with basic self-care skills and increase independence with basic self-care skills prior to discharge home with care partner.  Anticipate patient will require 24 hour supervision and follow up home health.  OT - End of Session Endurance Deficit: Yes OT Assessment Rehab Potential (ACUTE ONLY): Fair OT Barriers to Discharge: Other  (comments);Decreased caregiver support (global aphasia) OT Patient demonstrates impairments in the following area(s): Behavior;Cognition;Endurance;Safety;Perception;Sensory OT Basic ADL's Functional Problem(s): Eating;Grooming;Bathing;Dressing;Toileting OT Transfers Functional Problem(s): Toilet;Tub/Shower OT Additional Impairment(s): None OT Plan OT Intensity: Minimum of 1-2 x/day, 45 to 90 minutes OT Frequency: 5 out of 7 days OT Duration/Estimated Length of Stay: 6-8 OT Treatment/Interventions: Cognitive remediation/compensation;Balance/vestibular training;Discharge planning;Disease mangement/prevention;Functional electrical stimulation;Functional mobility training;DME/adaptive equipment instruction;Pain management;Patient/family education;Psychosocial support;Self Care/advanced ADL retraining;Skin care/wound managment;Therapeutic Activities;UE/LE Coordination activities;Therapeutic Exercise;UE/LE Strength taining/ROM;Wheelchair propulsion/positioning OT Self Feeding Anticipated Outcome(s): S OT Basic Self-Care Anticipated Outcome(s): S OT Toileting Anticipated Outcome(s): S OT Bathroom Transfers Anticipated Outcome(s): S OT Recommendation Patient destination: Home Follow Up Recommendations: Home health OT Equipment Recommended: To be determined   Skilled Therapeutic Intervention 1:1. Pt sitting in chair upon arrival attempting to use fork to doff QRB. Pt able to say "Hi nice to meet you" when OT reaches out hand to introduce self. Pt unable to follow commnads consistently and nearly entire session vocalizes "word salad." Pt unaware she is not saying sentences that make sense. Pt ambulates with HHA to bathroom with min A for balance to void urine with min A for standing balance for hygiene. Pt stands at sink to wash hands with CGA. Pt transfers onto TTB in walk in shower with min HHA and visual cues to scoot on bench into shower. Pt requires demonstration cues for intiation of bathing tasks  in shower and requires mod cueing for sequencing bathing body parts and termination of bathing task as pt washes legs 3x before being able to move onto UB. Pt dresses EOB with A to orient/hook bra. Pt dons shirt putting arm  and head through head hole. Pt unable to problem solve bringing arm out of hole and requires total A. Pt completes LB dressing with A to fasten B shoes and CGA for advancing pants past hips, however pt has still not threaded R arm into sleeve. Pt stands at mirror for grooming and with visual feedback in mirror and question cue, pt able to thread arm into sleeve. Pt requires HOH A to locate/grab grooming items on both side of sink. Pt unable to apply toothpaste to toothbrush and requires HOHA 2/2 apraxia. Pt brushes teeth ~4 min prior to VC for termination. Exited session with pt seated in recliner with QRB donned and NT in room.    OT Evaluation Precautions/Restrictions  Precautions Precautions: Fall Precaution Comments: QRB in w/c or recliner, impaired cognition and globally aphasic Restrictions Weight Bearing Restrictions: No General   Vital Signs Therapy Vitals Temp: 98.9 F (37.2 C) Temp Source: Oral Pulse Rate: 96 Resp: 18 BP: (!) 122/93 Patient Position (if appropriate): Sitting Oxygen Therapy SpO2: 98 % O2 Device: Not Delivered Pain Pain Assessment Pain Assessment: No/denies pain Home Living/Prior Functioning Home Living Family/patient expects to be discharged to:: Unsure Living Arrangements: Alone Available Help at Discharge: Available 24 hours/day, Family Type of Home: House Home Access: Level entry Home Layout: One level Bathroom Shower/Tub: Tub/shower unit, Architectural technologist: Handicapped height Bathroom Accessibility: Yes Additional Comments: all history taken from chart review, pt globally aphasic and no family present to determine home situation or PLOF  Lives With: Alone Prior Function Level of Independence: Independent with gait,  Independent with transfers  Able to Take Stairs?: Yes 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 Pt requires HOH A to locate toothbrush/paste, apply toothpaste. Pt able to initate brushing teeth once toothpaste applied. Pt demo difficulty with termination brushing teeth for up to 4 min Cognition Overall Cognitive Status: Impaired/Different from baseline Arousal/Alertness: Awake/alert Orientation Level:  (unable to assess 2/2 cog/com deficits) Memory:  (difficult to assess due to aphasia) Attention: Sustained Sustained Attention: Impaired Sustained Attention Impairment: Verbal basic;Functional basic Awareness: Impaired Awareness Impairment: Intellectual impairment Problem Solving: Impaired Problem Solving Impairment: Verbal basic Executive Function:  (all impaired due to underlying lower level deficits ) Behaviors: Restless Safety/Judgment: Impaired Sensation Sensation Additional Comments: difficult to assess 2/2 aphasia and cognitive deficits Coordination Gross Motor Movements are Fluid and Coordinated: Yes Fine Motor Movements are Fluid and Coordinated: Yes Motor  Motor Motor: Within Functional Limits Mobility  Transfers Sit to Stand: 4: Min guard Stand to Sit: 4: Min guard  Trunk/Postural Assessment  Cervical Assessment Cervical Assessment: Within Functional Limits Thoracic Assessment Thoracic Assessment: Within Functional Limits Lumbar Assessment Lumbar Assessment: Within Functional Limits Postural Control Postural Control: Deficits on evaluation Protective Responses: delayed  Balance Balance Balance Assessed: Yes Dynamic Sitting Balance Sitting  balance - Comments:  (able to dress EOB with supervision for sitting balance) Static Standing Balance Static Standing - Balance Support: During functional activity Static Standing - Level of Assistance: 4: Min assist Dynamic Standing Balance Dynamic Standing - Balance Support: During functional activity Dynamic Standing - Level of Assistance: 4: Min assist Extremity/Trunk Assessment RUE Assessment RUE Assessment: Within Functional Limits LUE Assessment LUE Assessment: Within Functional Limits   See Function Navigator for  Current Functional Status.   Refer to Care Plan for Long Term Goals  Recommendations for other services: None    Discharge Criteria: Patient will be discharged from OT if patient refuses treatment 3 consecutive times without medical reason, if treatment goals not met, if there is a change in medical status, if patient makes no progress towards goals or if patient is discharged from hospital.  The above assessment, treatment plan, treatment alternatives and goals were discussed and mutually agreed upon: No family available/patient unable  Tonny Branch 09/05/2017, 4:30 PM

## 2017-09-05 NOTE — Care Management Note (Signed)
Inpatient Rehabilitation Center Individual Statement of Services  Patient Name:  Yvette Collier  Date:  09/05/2017  Welcome to the Inpatient Rehabilitation Center.  Our goal is to provide you with an individualized program based on your diagnosis and situation, designed to meet your specific needs.  With this comprehensive rehabilitation program, you will be expected to participate in at least 3 hours of rehabilitation therapies Monday-Friday, with modified therapy programming on the weekends.  Your rehabilitation program will include the following services:  Physical Therapy (PT), Occupational Therapy (OT), Speech Therapy (ST), 24 hour per day rehabilitation nursing, Therapeutic Recreaction (TR), Case Management (Social Worker), Rehabilitation Medicine, Nutrition Services and Pharmacy Services  Weekly team conferences will be held on Wednesday to discuss your progress.  Your Social Worker will talk with you frequently to get your input and to update you on team discussions.  Team conferences with you and your family in attendance may also be held.  Expected length of stay:7-10 days  Overall anticipated outcome: supervision with cueing  Depending on your progress and recovery, your program may change. Your Social Worker will coordinate services and will keep you informed of any changes. Your Social Worker's name and contact numbers are listed  below.  The following services may also be recommended but are not provided by the Inpatient Rehabilitation Center:   Driving Evaluations  Home Health Rehabiltiation Services  Outpatient Rehabilitation Services    Arrangements will be made to provide these services after discharge if needed.  Arrangements include referral to agencies that provide these services.  Your insurance has been verified to be:  [employer]-Medicare Your primary doctor is:  Naval architect  Pertinent information will be shared with your doctor and your insurance company.  Social  Worker:  Dossie Der, [phone] or [phone]  Information discussed with and copy given to patient by: Lucy Chris, 09/05/2017, 10:55 AM

## 2017-09-05 NOTE — Progress Notes (Signed)
Patient information reviewed and entered into eRehab System by Becky Ailen Strauch, covering PPS coordinator. Information including medical coding and functional independence measure will be reviewed and updated through discharge.  Per nursing, patient was given "Data Collection Information Summary for Patients in Inpatient Rehabilitation Facilities with attached Privacy Act Statement Health Care Records" upon admission.     

## 2017-09-05 NOTE — Progress Notes (Signed)
Social Work Assessment and Plan Social Work Assessment and Plan  Patient Details  Name: Yvette Collier MRN: [id number] Date of Birth: 1941-03-27  Today's Date: 09/05/2017  Problem List:  Patient Active Problem List   Diagnosis Date Noted  . Gait disturbance, post-stroke 09/04/2017  . Wernicke's aphasia 09/04/2017  . 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 However, needs to rule out CAA, tumor or CVT (vein of labbe)  09/01/2017  . Constipation 01/06/2017  . DJD (degenerative joint disease) of knee 07/18/2016  . GIB (gastrointestinal bleeding) 05/25/2016  . GI bleed 05/24/2016  . Degenerative joint disease (DJD) of hip 05/08/2016  . 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 Hypertensive heart disease   . Hemorrhage of gastrointestinal tract 11/13/2015  . Lumbosacral facet joint syndrome (HCC) 07/26/2015  . Angioedema 07/13/2015  . DM2 (diabetes mellitus, type 2) (HCC) 07/13/2015  . Status post lumbar laminectomy 06/19/2015  . Lumbar radiculopathy 06/19/2015  . DDD (degenerative disc disease), lumbar 05/11/2015  . Facet syndrome, lumbar (HCC) 05/11/2015  . Lumbar post-laminectomy syndrome 05/11/2015  . Sacroiliac joint disease 05/11/2015  . 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 Neuropathy due to secondary diabetes (HCC) 05/11/2015  . Essential hypertension 01/03/2015  . Coronary atherosclerosis of native coronary artery   . PAF (paroxysmal atrial fibrillation) (HCC) 10/20/2014  . Ischemic colitis (HCC) 10/20/2014  . Diabetes mellitus type 2 with complications (HCC) 10/20/2014  . COPD (chronic obstructive pulmonary disease) (HCC) 10/20/2014  . Hyperlipidemia 10/20/2014  . GERD (gastroesophageal reflux disease) 10/20/2014  . Ingrowing nail, right great toe 04/27/2014  . Internal hemorrhoid, bleeding 07/14/2013   Past Medical History:  Past Medical  History:  Diagnosis Date  . Asthma   . Chronic lower back pain   . COPD (chronic obstructive pulmonary disease) (HCC)   . Diabetes mellitus without complication (HCC)   . Gastrointestinal bleed   . GERD (gastroesophageal reflux disease)   . History of colon polyps   . Hypertension   . Hypertensive heart disease   . IBS (irritable bowel syndrome)   . Ischemic colitis (HCC)   . Non-obstructive Coronary Artery Disease    a. 05/2009 Cath [facility]): minimal nonobs dzs.  . Osteoarthritis   . PAF (paroxysmal atrial fibrillation) (HCC)    a. 09/2014 during GI illness;  b. CHA2DS2VASc = 5 (not currently on [place] 2/2 h/o GIB/ischemic colitis); c. 09/2014 Echo: EF 60-65%, imparied relaxation, nl RV size/fxn.  . Transient cerebral ischemia due to atrial fibrillation [facility])    Past Surgical History:  Past Surgical History:  Procedure Laterality Date  . ABDOMINAL HYSTERECTOMY    . APPENDECTOMY    . BACK SURGERY     x 3, last 2004.  . CHOLECYSTECTOMY    . COLONOSCOPY WITH PROPOFOL N/A 03/20/2017   Procedure: COLONOSCOPY WITH PROPOFOL;  Surgeon: Wyline Mood, MD;  [facility] ENDOSCOPY;  Service: Endoscopy;  Laterality: N/A;  . ESOPHAGOGASTRODUODENOSCOPY (EGD) WITH PROPOFOL N/A 03/20/2017   Procedure: ESOPHAGOGASTRODUODENOSCOPY (EGD) WITH PROPOFOL;  Surgeon: Wyline Mood, MD;  Location: [facility] ENDOSCOPY;  Service: Endoscopy;  Laterality: N/A;  . FOOT SURGERY Right   . HEMORRHOID BANDING    . left total hip arthroplasty  2012  . STOMACH SURGERY    . TONSILLECTOMY     Social History:  reports that she has never smoked. She has never used smokeless tobacco. She reports that she does not drink alcohol  or use drugs.  Family / Support Systems Marital Status: Divorced Patient Roles: Parent, Other (Comment) (pastor) Children: Devona-daughter-POA [phone] Other Supports: Claudia-daughter [phone] Anticipated Caregiver: Two daughter's and daughter in-law Ability/Limitations of Caregiver: Devona  works nights and other tow can flex their schedules to providee assist. Made aware will need 24 hr supervision due to speech issues Caregiver Availability: 24/7 Family Dynamics: Very close knit family who all will pull together to make sure pt has whatever she needs. She has always been there for them and now it is their turn. Pt has many friends, family and church members who will support her.  Social History Preferred language: English Religion: Holiness Cultural Background: No issues Education: Automotive engineer educated Read: Yes Write: Yes Employment Status: Retired Date Retired/Disabled/Unemployed: Civil engineer, contracting Issues: No issues Guardian/Conservator: Devona-is her POA, MD feels pt is not fully capable of making her own decisions at this time due to speech/language issues form her stroke. Will look toward her daughter-Devona to make any decisions while here.   Abuse/Neglect Physical Abuse: Denies Verbal Abuse: Denies Sexual Abuse: Denies Exploitation of patient/patient's resources: Denies Self-Neglect: Denies  Emotional Status Pt's affect, behavior adn adjustment status: Pt talks like she is telling you exactly what she is saying. She has always been independent and taken care of others throughout her life. Daughter hopes she does well here and can atleast communicate her needs with them by the time she leaves here. Pt is fairly high level physically and will not be her elong. Recent Psychosocial Issues: Other health issues was independent and lived alone Pyschiatric History: No history or issues. Will need to re-evaluate due to inability to understand what she is communicating due to CVA. Hopefully as she progresses she will be able to express her basic needs. Substance Abuse History: No issues  Patient / Family Perceptions, Expectations & Goals Pt/Family understanding of illness & functional limitations: Daughter can explain her mom's stroke and her deficits.  She and her sister have spoken with the MD and feel they have a basic understanding of her condition. Both hope she will progress and do well on rehab. Premorbid pt/family roles/activities: mom, grandnmother, friend, Education officer, environmental, Nutritional therapist member, etc Anticipated changes in roles/activities/participation: hopes to resume Pt/family expectations/goals: Pt states: " There to be there."  Daughter states: " I hope we all do she does well here and can recover from this, at the very least be able to communicat heer needs."  [employer]: None Premorbid Home Care/DME Agencies: None Transportation available at discharge: Family Resource referrals recommended: Support group (specify)  Discharge Planning Living Arrangements: Alone Support Systems: Children, Other relatives, Manufacturing engineer, Psychologist, clinical community Type of Residence: Private residence Insurance Resources: Media planner (specify) Investment banker, operational) Financial Resources: Restaurant manager, fast food Screen Referred: No Living Expenses: Own Money Management: Patient Does the patient have any problems obtaining your medications?: No Home Management: Patient Patient/Family Preliminary Plans: Return to her home with daughter's and daughter in-law providing assistance. Made daughter aware with her speech/language issues she will need supervision for safety. She is doing well physically and can ambulate with therapists for evaluation. Social Work Anticipated Follow Up Needs: HH/OP, Support Group  Clinical Impression Very pleasant cheerful patient just unable to understand her due to her speech/language issues form her stroke. Her family is very supportive and involved and plans to provide care to pt at discharge. All hopeful she will Do well here and make good progress with her speech. Aware therapy team evaluating today and setting goals. Will  work on safe discharge plan.   Lucy Chris 09/05/2017, 1:12 PM

## 2017-09-05 NOTE — Evaluation (Signed)
Speech Language Pathology Assessment and Plan  Patient Details  Name: Yvette Collier MRN: [id number] Date of Birth: May 04, 1941  SLP Diagnosis: Aphasia;Cognitive Impairments;Dysphagia  Rehab Potential: Fair ELOS: ~7 days     Today's Date: 09/05/2017 SLP Individual Time: 1505-1600 SLP Individual Time Calculation (min): 55 min   Problem List:  Patient Active Problem List   Diagnosis Date Noted  . Gait disturbance, post-stroke 09/04/2017  . Wernicke's aphasia 09/04/2017  . 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 However, needs to rule out CAA, tumor or CVT (vein of labbe)  09/01/2017  . Constipation 01/06/2017  . DJD (degenerative joint disease) of knee 07/18/2016  . GIB (gastrointestinal bleeding) 05/25/2016  . GI bleed 05/24/2016  . Degenerative joint disease (DJD) of hip 05/08/2016  . 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 Hypertensive heart disease   . Hemorrhage of gastrointestinal tract 11/13/2015  . Lumbosacral facet joint syndrome ([place]) 07/26/2015  . Angioedema 07/13/2015  . DM2 (diabetes mellitus, type 2) ([place]) 07/13/2015  . Status post lumbar laminectomy 06/19/2015  . Lumbar radiculopathy 06/19/2015  . DDD (degenerative disc disease), lumbar 05/11/2015  . Facet syndrome, lumbar ([place]) 05/11/2015  . Lumbar post-laminectomy syndrome 05/11/2015  . Sacroiliac joint disease 05/11/2015  . 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 Neuropathy due to secondary diabetes ([place]) 05/11/2015  . Essential hypertension 01/03/2015  . Coronary atherosclerosis of native coronary artery   . PAF (paroxysmal atrial fibrillation) ([place]) 10/20/2014  . Ischemic colitis ([place]) 10/20/2014  . Diabetes mellitus type 2 with complications ([place]) 08/67/6195  . COPD (chronic obstructive pulmonary disease) ([place]) 10/20/2014  . Hyperlipidemia 10/20/2014  . GERD (gastroesophageal  reflux disease) 10/20/2014  . Ingrowing nail, right great toe 04/27/2014  . Internal hemorrhoid, bleeding 07/14/2013   Past Medical History:  Past Medical History:  Diagnosis Date  . Asthma   . Chronic lower back pain   . COPD (chronic obstructive pulmonary disease) ([place])   . Diabetes mellitus without complication ([place])   . Gastrointestinal bleed   . GERD (gastroesophageal reflux disease)   . History of colon polyps   . Hypertension   . Hypertensive heart disease   . IBS (irritable bowel syndrome)   . Ischemic colitis ([place])   . Non-obstructive Coronary Artery Disease    a. 05/2009 Cath [facility]): minimal nonobs dzs.  . Osteoarthritis   . PAF (paroxysmal atrial fibrillation) ([place])    a. 09/2014 during GI illness;  b. CHA2DS2VASc = 5 (not currently on [place] 2/2 h/o GIB/ischemic colitis); c. 09/2014 Echo: EF 60-65%, imparied relaxation, nl RV size/fxn.  . Transient cerebral ischemia due to atrial fibrillation [facility])    Past Surgical History:  Past Surgical History:  Procedure Laterality Date  . ABDOMINAL HYSTERECTOMY    . APPENDECTOMY    . BACK SURGERY     x 3, last 2004.  . CHOLECYSTECTOMY    . COLONOSCOPY WITH PROPOFOL N/A 03/20/2017   Procedure: COLONOSCOPY WITH PROPOFOL;  Surgeon: Jonathon Bellows, MD;  Location: [facility] ENDOSCOPY;  Service: Endoscopy;  Laterality: N/A;  . ESOPHAGOGASTRODUODENOSCOPY (EGD) WITH PROPOFOL N/A 03/20/2017   Procedure: ESOPHAGOGASTRODUODENOSCOPY (EGD) WITH PROPOFOL;  Surgeon: Jonathon Bellows, MD;  Location: [facility] ENDOSCOPY;  Service: Endoscopy;  Laterality: N/A;  . FOOT SURGERY Right   . HEMORRHOID BANDING    . left total hip arthroplasty  2012  . STOMACH SURGERY    . TONSILLECTOMY  Assessment / Plan / Recommendation Clinical Impression  Yvette Collier a 76 y.o.right handed femalewith history of hypertension,[name],diabetes mellitus, COPD. Patient lives alone independent prior to admission and works as a [employer] park manager. One  level home. Admitted 09/01/2017 with altered mental statusright side weaknessas well as aphasia. Blood pressure elevated 536 systolic and was started on nicardipine drip. CT of the head showed a large acute left temporal lobe hematoma approximately 15.9 mL suspect hypertensive hemorrhage. CT cervical spine negative. Neurology follow-up with conservative care monitoring of blood pressure. Physical therapy evaluation completed 09/02/2017 with recommendations of physical medicine rehabilitation consult.Patient was admitted for a comprehensive rehabilitation program.  SLP evaluation was completed on 09/05/2017 with the following results:  Pt presents with a severe receptive aphasia.  Pt needs max to total cues to follow 1 step commands and answer simple, biographical yes/no questions and her verbal expression is characterized by streams of jargon with islands of clear, rote phrases.  Pt has no awareness of her communication deficits and needs max to total cues for cessation of talking.  Pt also demonstrates moderately severe cognitive impairments with decreased safety awareness and impulsivity.   Pt demonstrates improved automaticity with self feeding and consumed solids and liquids without overt s/s of aspiration or significant oral residue.  Pt was overall min assist-supervision for use of swallowing precautions; however, given the extent of her cognitive-linguistic deficits, favor leaving her on her currently prescribed diet for now with close follow up for diet progression. Pt will benefit from skilled ST while inpatient in order to maximize functional independence and reduce burden of care prior to discharge.  Anticipate that pt will need 24/7 supervision at discharge in addition to [place] follow up at next level of care.    Skilled Therapeutic Interventions          Cognitive-linguistic and bedside swallow evaluation completed with results and recommendations reviewed with family.     SLP Assessment  Patient  will need skilled Speech Lanaguage Pathology Services during CIR admission    Recommendations  SLP Diet Recommendations: Dysphagia 2 (Fine chop);Thin Liquid Administration via: Straw;Cup Medication Administration: Crushed with puree Supervision: Patient able to self feed;Full supervision/cueing for compensatory strategies Compensations: Slow rate;Small sips/bites;Lingual sweep for clearance of pocketing Postural Changes and/or Swallow Maneuvers: Seated upright 90 degrees Oral Care Recommendations: Oral care BID Patient destination: Home Follow up Recommendations: 24 hour supervision/assistance;Home Health SLP;Outpatient SLP Equipment Recommended: None recommended by SLP    SLP Frequency 3 to 5 out of 7 days   SLP Duration  SLP Intensity  SLP Treatment/Interventions ~7 days   Minumum of 1-2 x/day, 30 to 90 minutes  Cognitive remediation/compensation;Cueing hierarchy;Dysphagia/aspiration precaution training;Environmental controls;Multimodal communication approach;Internal/external aids;Speech/Language facilitation;Patient/family education    Pain Pain Assessment Pain Assessment: No/denies pain  Prior Functioning Cognitive/Linguistic Baseline: Within functional limits (per report) Type of Home: House  Lives With: Alone Available Help at Discharge: Available 24 hours/day;Family  Function:  Eating Eating   Modified Consistency Diet: Yes Eating Assist Level: Supervision or verbal cues;Set up assist for   Eating Set Up Assist For: Opening containers       Cognition Comprehension Comprehension assist level: Understands basic less than 25% of the time/ requires cueing >75% of the time  Expression   Expression assist level: Expresses basis less than 25% of the time/requires cueing >75% of the time.  Social Interaction Social Interaction assist level: Interacts appropriately 25 - 49% of time - Needs frequent redirection.  Problem Solving Problem solving assist level: Solves  basic less than 25% of the time - needs direction nearly all the time or does not effectively solve problems and may need a restraint for safety  Memory Memory assist level: Recognizes or recalls less than 25% of the time/requires cueing greater than 75% of the time   Short Term Goals: Week 1: SLP Short Term Goal 1 (Week 1): STG=LTG due to ELOS   Refer to Care Plan for Long Term Goals  Recommendations for other services: None   Discharge Criteria: Patient will be discharged from SLP if patient refuses treatment 3 consecutive times without medical reason, if treatment goals not met, if there is a change in medical status, if patient makes no progress towards goals or if patient is discharged from hospital.  The above assessment, treatment plan, treatment alternatives and goals were discussed and mutually agreed upon: No family available/patient unable  Emilio Math 09/05/2017, 4:33 PM

## 2017-09-06 ENCOUNTER — Inpatient Hospital Stay (HOSPITAL_COMMUNITY): Payer: Medicare Other

## 2017-09-06 ENCOUNTER — Inpatient Hospital Stay (HOSPITAL_COMMUNITY): Payer: Medicare Other | Admitting: Physical Therapy

## 2017-09-06 ENCOUNTER — Inpatient Hospital Stay (HOSPITAL_COMMUNITY): Payer: Medicare Other | Admitting: Speech Pathology

## 2017-09-06 LAB — GLUCOSE, CAPILLARY
GLUCOSE-CAPILLARY: 128 mg/dL — AB (ref 65–99)
GLUCOSE-CAPILLARY: 136 mg/dL — AB (ref 65–99)
Glucose-Capillary: 120 mg/dL — ABNORMAL HIGH (ref 65–99)
Glucose-Capillary: 265 mg/dL — ABNORMAL HIGH (ref 65–99)

## 2017-09-06 NOTE — Progress Notes (Signed)
Speech Language Pathology Daily Session Note  Patient Details  Name: Yvette Collier MRN: [id number] Date of Birth: 04-19-1941  Today's Date: 09/06/2017 SLP Individual Time: 1300-1400 SLP Individual Time Calculation (min): 60 min  Short Term Goals: Week 1: SLP Short Term Goal 1 (Week 1): STG=LTG due to ELOS   Skilled Therapeutic Interventions: Skilled treatment session focused on language goals. Pt with excessive verbal output that contained mostly jargon. Pt able to be redirected to task but unable to indicate understanding of 1 step directions or task (such as looking through newspaper, grabbing safety belt to "take with her to gym." Pt unable to produce any form of object naming even with Total cues. Pt able to match picture to object in field of 2 objects with 100% accuracy. Pt able to count 10 pennies and attempted to sing rote songs. Pt with good melodic intonation with song but no recognizable words. Pt left upright in chair, chair alarm on and all needs within reach at nursing station.     Function:   Cognition Comprehension Comprehension assist level: Understands basic less than 25% of the time/ requires cueing >75% of the time  Expression   Expression assist level: Expresses basis less than 25% of the time/requires cueing >75% of the time.  Social Interaction Social Interaction assist level: Interacts appropriately 25 - 49% of time - Needs frequent redirection.  Problem Solving Problem solving assist level: Solves basic less than 25% of the time - needs direction nearly all the time or does not effectively solve problems and may need a restraint for safety  Memory Memory assist level: Recognizes or recalls less than 25% of the time/requires cueing greater than 75% of the time    Pain    Therapy/Group: Individual Therapy  Ramon Zanders 09/06/2017, 1:35 PM

## 2017-09-06 NOTE — Progress Notes (Signed)
Physical Therapy Session Note  Patient Details  Name: Yvette Collier MRN: [id number] Date of Birth: 1941-08-17  Today's Date: 09/06/2017 PT Individual Time: 1545-1650 PT Individual Time Calculation (min): 65 min   Short Term Goals: Week 1:  =LTGs due to ELOS  Skilled Therapeutic Interventions/Progress Updates:   Pt in w/c upon arrival at nurses station and agreeable to therapy. Worked on higher level balance, endurance w/ gait, attention to task, and command following this session.   Pt ambulated multiple times in ~300' bouts w/ HHA or RW use (supervision w/ RW) and had seated rest breaks. Attempted to orient pt to environment and 1-step command following during seated rest breaks. Pt able to follow 1-step commands inconsistently and successful w/ automatic tasks only such as "stand up" or "write your name".   Spent 10-15 minutes working w/ [employer] while seated and standing. Focused on dynamic NMR in both positions, attention to task, and scanning the environment, verbal cues to "press the red button" at the beginning of the task. Multiple sit<>stands performed w/ close supervision and reaching low and high outside of BOS. Pt able to attend to task w/o cues for up to 3-4 minutes before becoming distracted by her own verbal jargon. Additionally able to distinguish between red and green buttons and continue only pressing red when green buttons were added, increased distractibility w/ this task however.   Additional dynamic standing balance w/ close supervision while dancing to music, dancing for 1-2 minutes at a time. Occasional LOB that was corrected w/ Min A from therapist.   Ended session in w/c at nurses station in care of NTs and RNs present, seatbelt on and all needs met.   Therapy Documentation Precautions:  Precautions Precautions: Fall Precaution Comments: QRB in w/c or recliner, impaired cognition and globally aphasic Restrictions Weight Bearing Restrictions: No  See Function  Navigator for Current Functional Status.   Therapy/Group: Individual Therapy  Kaylla Cobos K Arnette 09/06/2017, 5:07 PM

## 2017-09-06 NOTE — Progress Notes (Signed)
Subjective/Complaints: Remains with fluent aphsia occ intelligible phrase  ROS- cannot obtain  Objective: Vital Signs: Blood pressure 116/68, pulse 88, temperature 99 F (37.2 C), temperature source Oral, resp. rate 18, height 5' 6"  (1.676 m), weight 70.3 kg (155 lb), SpO2 97 %. Dg Chest Port 1 View  Result Date: 09/04/2017 CLINICAL DATA:  Cough EXAM: PORTABLE CHEST 1 VIEW COMPARISON:  CT chest 06/22/2017 FINDINGS: The heart size and mediastinal contours are within normal limits. Both lungs are clear. The visualized skeletal structures are unremarkable. IMPRESSION: No active disease. Electronically Signed   By: Kathreen Devoid   On: 09/04/2017 09:34   Results for orders placed or performed during the hospital encounter of 09/04/17 (from the past 72 hour(s))  Glucose, capillary     Status: Abnormal   Collection Time: 09/04/17  4:57 PM  Result Value Ref Range   Glucose-Capillary 126 (H) 65 - 99 mg/dL   Comment 1 Notify RN   Glucose, capillary     Status: None   Collection Time: 09/04/17  9:21 PM  Result Value Ref Range   Glucose-Capillary 75 65 - 99 mg/dL   Comment 1 Notify RN   CBC WITH DIFFERENTIAL     Status: Abnormal   Collection Time: 09/05/17  5:49 AM  Result Value Ref Range   WBC 7.1 4.0 - 10.5 K/uL   RBC 3.66 (L) 3.87 - 5.11 MIL/uL   Hemoglobin 10.6 (L) 12.0 - 15.0 g/dL   HCT 32.9 (L) 36.0 - 46.0 %   MCV 89.9 78.0 - 100.0 fL   MCH 29.0 26.0 - 34.0 pg   MCHC 32.2 30.0 - 36.0 g/dL   RDW 12.6 11.5 - 15.5 %   Platelets 260 150 - 400 K/uL   Neutrophils Relative % 69 %   Neutro Abs 4.9 1.7 - 7.7 K/uL   Lymphocytes Relative 20 %   Lymphs Abs 1.4 0.7 - 4.0 K/uL   Monocytes Relative 8 %   Monocytes Absolute 0.5 0.1 - 1.0 K/uL   Eosinophils Relative 3 %   Eosinophils Absolute 0.2 0.0 - 0.7 K/uL   Basophils Relative 0 %   Basophils Absolute 0.0 0.0 - 0.1 K/uL  Comprehensive metabolic panel     Status: Abnormal   Collection Time: 09/05/17  5:49 AM  Result Value Ref Range    Sodium 138 135 - 145 mmol/L   Potassium 3.8 3.5 - 5.1 mmol/L   Chloride 103 101 - 111 mmol/L   CO2 28 22 - 32 mmol/L   Glucose, Bld 115 (H) 65 - 99 mg/dL   BUN 13 6 - 20 mg/dL   Creatinine, Ser 1.01 (H) 0.44 - 1.00 mg/dL   Calcium 9.2 8.9 - 10.3 mg/dL   Total Protein 6.4 (L) 6.5 - 8.1 g/dL   Albumin 3.3 (L) 3.5 - 5.0 g/dL   AST 18 15 - 41 U/L   ALT 16 14 - 54 U/L   Alkaline Phosphatase 83 38 - 126 U/L   Total Bilirubin 0.9 0.3 - 1.2 mg/dL   GFR calc non Af Amer 52 (L) >60 mL/min   GFR calc Af Amer >60 >60 mL/min    Comment: (NOTE) The eGFR has been calculated using the CKD EPI equation. This calculation has not been validated in all clinical situations. eGFR's persistently <60 mL/min signify possible Chronic Kidney Disease.    Anion gap 7 5 - 15  Glucose, capillary     Status: None   Collection Time: 09/05/17  6:52 AM  Result Value Ref Range   Glucose-Capillary 91 65 - 99 mg/dL   Comment 1 Notify RN   Glucose, capillary     Status: Abnormal   Collection Time: 09/05/17 11:44 AM  Result Value Ref Range   Glucose-Capillary 146 (H) 65 - 99 mg/dL  Glucose, capillary     Status: Abnormal   Collection Time: 09/05/17  5:03 PM  Result Value Ref Range   Glucose-Capillary 127 (H) 65 - 99 mg/dL  Glucose, capillary     Status: Abnormal   Collection Time: 09/05/17  9:34 PM  Result Value Ref Range   Glucose-Capillary 110 (H) 65 - 99 mg/dL   Comment 1 Notify RN   Glucose, capillary     Status: Abnormal   Collection Time: 09/06/17  6:53 AM  Result Value Ref Range   Glucose-Capillary 120 (H) 65 - 99 mg/dL   Comment 1 Notify RN      HEENT: normal Cardio: RRR and no murmur Resp: CTA B/L and unlabored GI: BS positive and NT, ND Extremity:  No Edema Skin:   Intact Neuro: Confused, Normal Motor and Aphasic Musc/Skel:  Other no pain with UE ROM Gen NAD   Assessment/Plan: 1. Functional deficits secondary to left temporal lobe hemorrhage which require 3+ hours per day of  interdisciplinary therapy in a comprehensive inpatient rehab setting. Physiatrist is providing close team supervision and 24 hour management of active medical problems listed below. Physiatrist and rehab team continue to assess barriers to discharge/monitor patient progress toward functional and medical goals. FIM: Function - Bathing Position: Shower Body parts bathed by patient: Right arm, Left arm, Left lower leg, Chest, Abdomen, Front perineal area, Buttocks, Left upper leg, Right upper leg, Right lower leg Body parts bathed by helper: Back Assist Level: Touching or steadying assistance(Pt > 75%)  Function- Upper Body Dressing/Undressing What is the patient wearing?: Bra, Pull over shirt/dress Bra - Perfomed by patient: Thread/unthread right bra strap, Thread/unthread left bra strap Bra - Perfomed by helper: Hook/unhook bra (pull down sports bra) Pull over shirt/dress - Perfomed by patient: Thread/unthread right sleeve, Thread/unthread left sleeve, Put head through opening, Pull shirt over trunk Assist Level: Touching or steadying assistance(Pt > 75%) Function - Lower Body Dressing/Undressing What is the patient wearing?: Pants, Non-skid slipper socks, Shoes, Underwear Position: Sitting EOB Underwear - Performed by patient: Thread/unthread right underwear leg, Thread/unthread left underwear leg, Pull underwear up/down Pants- Performed by patient: Thread/unthread right pants leg, Thread/unthread left pants leg, Pull pants up/down Non-skid slipper socks- Performed by patient: Don/doff right sock, Don/doff left sock Shoes - Performed by patient: Don/doff right shoe, Don/doff left shoe Shoes - Performed by helper: Fasten right, Fasten left Assist for footwear: Partial/moderate assist Assist for lower body dressing: Touching or steadying assistance (Pt > 75%)  Function - Toileting Toileting steps completed by patient: Adjust clothing prior to toileting, Adjust clothing after  toileting Assist level: Touching or steadying assistance (Pt.75%)  Function - Toilet Transfers Toilet transfer assistive device: Elevated toilet seat/BSC over toilet Assist level to toilet: Touching or steadying assistance (Pt > 75%) Assist level from toilet: Touching or steadying assistance (Pt > 75%)  Function - Chair/bed transfer Chair/bed transfer method: Ambulatory, Stand pivot Chair/bed transfer assist level: Touching or steadying assistance (Pt > 75%) Chair/bed transfer details: Visual cues for safe use of DME/AE, Visual cues/gestures for precautions/safety  Function - Locomotion: Wheelchair Will patient use wheelchair at discharge?: No Function - Locomotion: Ambulation Assistive device: Hand held assist Max distance: 300 Assist level:  Touching or steadying assistance (Pt > 75%) Assist level: Touching or steadying assistance (Pt > 75%) Assist level: Touching or steadying assistance (Pt > 75%) Assist level: Touching or steadying assistance (Pt > 75%) Assist level: Touching or steadying assistance (Pt > 75%)  Function - Comprehension Comprehension: Auditory Comprehension assist level: Understands basic less than 25% of the time/ requires cueing >75% of the time  Function - Expression Expression: Verbal Expression assist level: Expresses basis less than 25% of the time/requires cueing >75% of the time.  Function - Social Interaction Social Interaction assist level: Interacts appropriately 25 - 49% of time - Needs frequent redirection.  Function - Problem Solving Problem solving assist level: Solves basic less than 25% of the time - needs direction nearly all the time or does not effectively solve problems and may need a restraint for safety  Function - Memory Memory assist level: Recognizes or recalls less than 25% of the time/requires cueing greater than 75% of the time Patient normally able to recall (first 3 days only): None of the above  Medical Problem List and  Plan: 1. Right hemiparesis and receptive expressive language deficitssecondary to left temporal lobe hemorrhage secondary to hypertensive crisis CIR PT, OT, SLP progress limited due to comprehension deficits 2. DVT Prophylaxis/Anticoagulation: SCDs- no anticoagulants due to bleed 3. Pain Management/chronic low back pain: Lyrica 25 mg twice a day 4. Mood: Provide emotional support 5. Neuropsych: This patient iscapable of making decisions on herown behalf. 6. Skin/Wound Care: Routine skin checks 7. Fluids/Electrolytes/Nutrition: Routine I&O with follow-up chemistries 8.Hypertension/PAF. Cozaar 25 mg daily, Lopressor 12.5 mg twice a day.Followed by Dr.Gollan.Monitor with increased mobility.Cardiac rate control Vitals:   09/05/17 1430 09/06/17 0500  BP: (!) 122/93 116/68  Pulse: 96 88  Resp: 18 18  Temp: 98.9 F (37.2 C) 99 F (37.2 C)  SpO2: 98% 97%   9.Diabetes mellitus with peripheral neuropathy. Hemoglobin A1c 6.8. SSI.Check blood sugars before meals and at bedtime Diet controlled PTA, monitor 10.AKI.Creatinine mildly elevated at 1.19. Follow-up chemistries. Encourage fluids 10.Hyperlipidemia. Zocor 11. History of GI bleed. Continue Protonix , Hgb 10.6, stable vs 9/13 12.  Low grade temp 99 with nl WBC will check UA LOS (Days) 2 A FACE TO FACE EVALUATION WAS PERFORMED  Cherisa Brucker E 09/06/2017, 7:18 AM

## 2017-09-06 NOTE — Progress Notes (Signed)
Occupational Therapy Session Note  Patient Details  Name: Yvette Collier MRN: [id number] Date of Birth: [birth year]    Short Term Goals: Week 1:  OT Short Term Goal 1 (Week 1): STG=LTG 2/2 ELOS  Skilled Therapeutic Interventions/Progress Updates:    1:1. Pt ambulates throughout session with CGA and HHA with 1 LOB posteriorly and 1 LOB laterally with MIN A for recovery. Pt reporting pain in stomach and heartburn. RN aware. Pt voids bowel and bladder while seated on toilet with CGA for clothing management. Pt able to habitually walk to wash hands at sink. Pt dresses EOB with VC to locate clothing items on R side. Pt originally dons bra inside out and upside down. Pt needs visual cues to identify error and reorient bra. Pt requires A to fasten bra after reorientation of bra. Pt dons pull over shirt with sueprvision and CGA for advancing pants past hips for LB dressing. Pt able to fasten shoes this date with demonstration cueing. Pt stands at sink with CGA and able to locate grooming items with multi modal cueing. Pt able to apply toothpaste this session however requires Vc for termination of oral care and shutting off water. Exited session with pt seated in w/c with QRB donned and call light in reach.  Therapy Documentation Precautions:  Precautions Precautions: Fall Precaution Comments: QRB in w/c or recliner, impaired cognition and globally aphasic Restrictions Weight Bearing Restrictions: No General:    See Function Navigator for Current Functional Status.   Therapy/Group: Individual Therapy  Shon Hale 09/06/2017, 12:19 PM

## 2017-09-07 LAB — GLUCOSE, CAPILLARY
GLUCOSE-CAPILLARY: 113 mg/dL — AB (ref 65–99)
GLUCOSE-CAPILLARY: 138 mg/dL — AB (ref 65–99)
Glucose-Capillary: 173 mg/dL — ABNORMAL HIGH (ref 65–99)
Glucose-Capillary: 135 mg/dL — ABNORMAL HIGH (ref 65–99)

## 2017-09-07 LAB — URINALYSIS, ROUTINE W REFLEX MICROSCOPIC
BILIRUBIN URINE: NEGATIVE
GLUCOSE, UA: NEGATIVE mg/dL
HGB URINE DIPSTICK: NEGATIVE
KETONES UR: NEGATIVE mg/dL
LEUKOCYTES UA: NEGATIVE
Nitrite: NEGATIVE
PH: 6 (ref 5.0–8.0)
PROTEIN: NEGATIVE mg/dL
Specific Gravity, Urine: 1.009 (ref 1.005–1.030)

## 2017-09-07 LAB — TROPONIN I: Troponin I: <0.03 ng/mL (ref <0.03)

## 2017-09-07 MED ORDER — ALUM & MAG HYDROXIDE-SIMETH 200-200-20 MG/5ML PO SUSP
15.0000 mL | Freq: Four times a day (QID) | ORAL | Status: DC | PRN
  Administered 2017-09-08: 15 mL via ORAL
  Filled 2017-09-07: qty 30

## 2017-09-07 NOTE — Progress Notes (Signed)
Patient bladder distended this morning. Patient urinated this am and continues to express pain in abdomen/sternal area. Patient scanned for 566cc and cathed at 1030 for 500cc. MD Kirsteins notified. EKG done and results called to MD Kirsteins. No new orders received. Patient does go to toilet and void but urinary retention persists. Patient scanned this evening at 1800 for 649. At 1845 patient cathed for 750cc.

## 2017-09-07 NOTE — IPOC Note (Signed)
Overall Plan of Care [facility]) Patient Details Name: Yvette Collier MRN: [id number] DOB: September 23, 1941  Admitting Diagnosis: <principal problem not specified>  Hospital Problems: Active Problems:   PAF (paroxysmal atrial fibrillation) (HCC)   Left temporal lobe hemorrhage (HCC) -  left temporal moderate ICH and left frontal trace SDH, concerning for traumatic ICH due to fall. However, needs to rule out CAA, tumor or CVT (vein of labbe)    Gait disturbance, post-stroke   Wernicke's aphasia     Functional Problem List: Nursing Bladder, Bowel  PT Balance, Endurance, Safety  OT Behavior, Cognition, Endurance, Safety, Perception, Sensory  SLP Cognition, Linguistic, Nutrition  TR         Basic ADL's: OT Eating, Grooming, Bathing, Dressing, Toileting     Advanced  ADL's: OT       Transfers: PT Bed Mobility, Bed to Chair, Car, Occupational psychologist, Research scientist (life sciences): PT Stairs, Ambulation     Additional Impairments: OT None  SLP Swallowing, Communication, Social Cognition expression Attention, Awareness, Problem Solving  TR      Anticipated Outcomes Item Anticipated Outcome  Self Feeding S  Swallowing  Supervision    Basic self-care  S  Toileting  S   Bathroom Transfers S  Bowel/Bladder   (mod assist)  Transfers  supervision  Locomotion  supervision  Communication  Max assist   Cognition  Mod assist for very basic   Pain   (UTA at this time)  Safety/Judgment   (max assist)   Therapy Plan: PT Intensity: Minimum of 1-2 x/day ,45 to 90 minutes PT Frequency: 5 out of 7 days PT Duration Estimated Length of Stay: 7-10 OT Intensity: Minimum of 1-2 x/day, 45 to 90 minutes OT Frequency: 5 out of 7 days OT Duration/Estimated Length of Stay: 6-8 SLP Intensity: Minumum of 1-2 x/day, 30 to 90 minutes SLP Frequency: 3 to 5 out of 7 days SLP Duration/Estimated Length of Stay: ~7 days     Team Interventions: Nursing Interventions Dysphagia/Aspiration  Precaution Training, Skin Care/Wound Management  PT interventions Ambulation/gait training, Balance/vestibular training, Cognitive remediation/compensation, DME/adaptive equipment instruction, Discharge planning, Functional mobility training, Neuromuscular re-education, Patient/family education, Stair training, Psychosocial support, Therapeutic Activities, Therapeutic Exercise, UE/LE Strength taining/ROM, UE/LE Coordination activities, Visual/perceptual remediation/compensation  OT Interventions Cognitive remediation/compensation, Metallurgist training, Discharge planning, Disease mangement/prevention, Functional electrical stimulation, Functional mobility training, DME/adaptive equipment instruction, Pain management, Patient/family education, Psychosocial support, Self Care/advanced ADL retraining, Skin care/wound managment, Therapeutic Activities, UE/LE Coordination activities, Therapeutic Exercise, UE/LE Strength taining/ROM, Wheelchair propulsion/positioning  SLP Interventions Cognitive remediation/compensation, Cueing hierarchy, Dysphagia/aspiration precaution training, Environmental controls, Multimodal communication approach, Internal/external aids, Speech/Language facilitation, Patient/family education  TR Interventions    SW/CM Interventions Discharge Planning, Psychosocial Support, Patient/Family Education   Barriers to Discharge MD  Lack of/limited family support, Medication compliance and Nutritional means  Nursing Decreased caregiver support, Lack of/limited family support    PT Other (comments) globally aphasic   OT Other (comments), Decreased caregiver support (global aphasia)    SLP Nutrition means dys 2, thin liquids  SW       Team Discharge Planning: Destination: PT-Home ,OT- Home , SLP-Home Projected Follow-up: PT-Home health PT, 24 hour supervision/assistance, OT-  Home health OT, SLP-24 hour supervision/assistance, Home Health SLP, Outpatient SLP Projected Equipment  Needs: PT-None recommended by PT, OT- To be determined, SLP-None recommended by SLP Equipment Details: PT- , OT-  Patient/family involved in discharge planning: PT- Patient unable/family or caregiver not available,  OT-Patient, SLP-Patient unable/family or caregive  not available  MD ELOS: 7d Medical Rehab Prognosis:  Good Assessment:  76 y.o.right handed femalewith history of hypertension,[name],diabetes mellitus, COPD. Patient lives alone independent prior to admission and works as a Education officer, environmental. One level home. Admitted 09/01/2017 with altered mental statusright side weaknessas well as aphasia. Blood pressure elevated 175 systolic and was started on nicardipine drip. CT of the head showed a large acute left temporal lobe hematoma approximately 15.9 mL suspect hypertensive hemorrhage. CT cervical spine negative. Neurology follow-up with conservative care monitoring of blood pressure. Echocardiogram with ejection fraction 65% grade 1 diastolic dysfunction. Follow-up CT of head09/11/2018stable noted 5 mm left-to-right midline shift without ventricular entrapmentand again repeated 09/03/2017 showing stable size of left temporal lobe parenchymal hematoma stable mass effect and no new acute intracranial abnormalities.Mild elevation in creatinine 1.19 from baseline 0.87 and monitor   See Team Conference Notes for weekly updates to the plan of care  Now requiring 24/7 Rehab RN,MD, as well as CIR level PT, OT and SLP.  Treatment team will focus on ADLs and mobility with goals set at Mod I /Sup

## 2017-09-07 NOTE — Progress Notes (Signed)
Subjective/Complaints:  Aphasic with occ intelligible phrases  ROS- cannot obtain  Objective: Vital Signs: Blood pressure 128/62, pulse 61, temperature 98.4 F (36.9 C), temperature source Oral, resp. rate 16, height _0  (1.676 m), weight 70.3 kg (155 lb), SpO2 100 %. No results found. Results for orders placed or performed during the hospital encounter of 09/04/17 (from the past 72 hour(s))  Glucose, capillary     Status: Abnormal   Collection Time: 09/04/17  4:57 PM  Result Value Ref Range   Glucose-Capillary 126 (H) 65 - 99 mg/dL   Comment 1 Notify RN   Glucose, capillary     Status: None   Collection Time: 09/04/17  9:21 PM  Result Value Ref Range   Glucose-Capillary 75 65 - 99 mg/dL   Comment 1 Notify RN   CBC WITH DIFFERENTIAL     Status: Abnormal   Collection Time: 09/05/17  5:49 AM  Result Value Ref Range   WBC 7.1 4.0 - 10.5 K/uL   RBC 3.66 (L) 3.87 - 5.11 MIL/uL   Hemoglobin 10.6 (L) 12.0 - 15.0 g/dL   HCT 32.9 (L) 36.0 - 46.0 %   MCV 89.9 78.0 - 100.0 fL   MCH 29.0 26.0 - 34.0 pg   MCHC 32.2 30.0 - 36.0 g/dL   RDW 12.6 11.5 - 15.5 %   Platelets 260 150 - 400 K/uL   Neutrophils Relative % 69 %   Neutro Abs 4.9 1.7 - 7.7 K/uL   Lymphocytes Relative 20 %   Lymphs Abs 1.4 0.7 - 4.0 K/uL   Monocytes Relative 8 %   Monocytes Absolute 0.5 0.1 - 1.0 K/uL   Eosinophils Relative 3 %   Eosinophils Absolute 0.2 0.0 - 0.7 K/uL   Basophils Relative 0 %   Basophils Absolute 0.0 0.0 - 0.1 K/uL  Comprehensive metabolic panel     Status: Abnormal   Collection Time: 09/05/17  5:49 AM  Result Value Ref Range   Sodium 138 135 - 145 mmol/L   Potassium 3.8 3.5 - 5.1 mmol/L   Chloride 103 101 - 111 mmol/L   CO2 28 22 - 32 mmol/L   Glucose, Bld 115 (H) 65 - 99 mg/dL   BUN 13 6 - 20 mg/dL   Creatinine, Ser 1.01 (H) 0.44 - 1.00 mg/dL   Calcium 9.2 8.9 - 10.3 mg/dL   Total Protein 6.4 (L) 6.5 - 8.1 g/dL   Albumin 3.3 (L) 3.5 - 5.0 g/dL   AST 18 15 - 41 U/L   ALT 16 14 -  54 U/L   Alkaline Phosphatase 83 38 - 126 U/L   Total Bilirubin 0.9 0.3 - 1.2 mg/dL   GFR calc non Af Amer 52 (L) >60 mL/min   GFR calc Af Amer >60 >60 mL/min    Comment: (NOTE) The eGFR has been calculated using the CKD EPI equation. This calculation has not been validated in all clinical situations. eGFR's persistently <60 mL/min signify possible Chronic Kidney Disease.    Anion gap 7 5 - 15  Glucose, capillary     Status: None   Collection Time: 09/05/17  6:52 AM  Result Value Ref Range   Glucose-Capillary 91 65 - 99 mg/dL   Comment 1 Notify RN   Glucose, capillary     Status: Abnormal   Collection Time: 09/05/17 11:44 AM  Result Value Ref Range   Glucose-Capillary 146 (H) 65 - 99 mg/dL  Glucose, capillary     Status: Abnormal   Collection  Time: 09/05/17  5:03 PM  Result Value Ref Range   Glucose-Capillary 127 (H) 65 - 99 mg/dL  Glucose, capillary     Status: Abnormal   Collection Time: 09/05/17  9:34 PM  Result Value Ref Range   Glucose-Capillary 110 (H) 65 - 99 mg/dL   Comment 1 Notify RN   Glucose, capillary     Status: Abnormal   Collection Time: 09/06/17  6:53 AM  Result Value Ref Range   Glucose-Capillary 120 (H) 65 - 99 mg/dL   Comment 1 Notify RN   Glucose, capillary     Status: Abnormal   Collection Time: 09/06/17 11:42 AM  Result Value Ref Range   Glucose-Capillary 265 (H) 65 - 99 mg/dL  Glucose, capillary     Status: Abnormal   Collection Time: 09/06/17  4:55 PM  Result Value Ref Range   Glucose-Capillary 128 (H) 65 - 99 mg/dL  Glucose, capillary     Status: Abnormal   Collection Time: 09/06/17  9:20 PM  Result Value Ref Range   Glucose-Capillary 136 (H) 65 - 99 mg/dL   Comment 1 Notify RN   Glucose, capillary     Status: Abnormal   Collection Time: 09/07/17  6:35 AM  Result Value Ref Range   Glucose-Capillary 113 (H) 65 - 99 mg/dL     HEENT: normal Cardio: RRR and no murmur Resp: CTA B/L and unlabored GI: BS positive and NT, ND Extremity:  No  Edema Skin:   Intact Neuro: Confused, Normal Motor and Aphasic Musc/Skel:  Other no pain with UE ROM Gen NAD   Assessment/Plan: 1. 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 Physiatrist is providing close team supervision and 24 hour management of active medical problems listed below. Physiatrist and rehab team continue to assess barriers to discharge/monitor patient progress toward functional and medical goals. FIM: Function - Bathing Position: Shower Body parts bathed by patient: Right arm, Left arm, Left lower leg, Chest, Abdomen, Front perineal area, Buttocks, Left upper leg, Right upper leg, Right lower leg Body parts bathed by helper: Back Assist Level: Touching or steadying assistance(Pt > 75%)  Function- Upper Body Dressing/Undressing What is the patient wearing?: Bra, Pull over shirt/dress Bra - Perfomed by patient: Thread/unthread right bra strap, Thread/unthread left bra strap Bra - Perfomed by helper: Hook/unhook bra (pull down sports bra) Pull over shirt/dress - Perfomed by patient: Thread/unthread right sleeve, Thread/unthread left sleeve, Put head through opening, Pull shirt over trunk Assist Level: Touching or steadying assistance(Pt > 75%) Function - Lower Body Dressing/Undressing What is the patient wearing?: Pants, Non-skid slipper socks, Shoes, Underwear Position: Sitting EOB Underwear - Performed by patient: Thread/unthread right underwear leg, Thread/unthread left underwear leg, Pull underwear up/down Pants- Performed by patient: Thread/unthread right pants leg, Thread/unthread left pants leg, Pull pants up/down Non-skid slipper socks- Performed by patient: Don/doff right sock, Don/doff left sock Shoes - Performed by patient: Don/doff right shoe, Don/doff left shoe Shoes - Performed by helper: Fasten right, Fasten left Assist for footwear:  Partial/moderate assist Assist for lower body dressing: Touching or steadying assistance (Pt > 75%)  Function - Toileting Toileting steps completed by patient: Adjust clothing prior to toileting, Performs perineal hygiene, Adjust clothing after toileting Assist level: Touching or steadying assistance (Pt.75%)  Function - Toilet Transfers Toilet transfer assistive device: Elevated toilet seat/BSC over toilet Assist level to toilet: Touching or steadying assistance (Pt > 75%) Assist  level from toilet: Touching or steadying assistance (Pt > 75%)  Function - Chair/bed transfer Chair/bed transfer method: Ambulatory, Stand pivot Chair/bed transfer assist level: Supervision or verbal cues Chair/bed transfer assistive device: Armrests, Walker Chair/bed transfer details: Visual cues for safe use of DME/AE, Visual cues/gestures for precautions/safety  Function - Locomotion: Wheelchair Will patient use wheelchair at discharge?: No Function - Locomotion: Ambulation Assistive device: Hand held assist Max distance: >300' Assist level: Touching or steadying assistance (Pt > 75%) Assist level: Touching or steadying assistance (Pt > 75%) Assist level: Touching or steadying assistance (Pt > 75%) Assist level: Touching or steadying assistance (Pt > 75%) Assist level: Touching or steadying assistance (Pt > 75%)  Function - Comprehension Comprehension: Auditory Comprehension assist level: Understands basic less than 25% of the time/ requires cueing >75% of the time  Function - Expression Expression: Verbal Expression assist level: Expresses basis less than 25% of the time/requires cueing >75% of the time.  Function - Social Interaction Social Interaction assist level: Interacts appropriately less than 25% of the time. May be withdrawn or combative.  Function - Problem Solving Problem solving assist level: Solves basic less than 25% of the time - needs direction nearly all the time or does not  effectively solve problems and may need a restraint for safety  Function - Memory Memory assist level: Recognizes or recalls less than 25% of the time/requires cueing greater than 75% of the time Patient normally able to recall (first 3 days only): None of the above  Medical Problem List and Plan: 1. 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 DVT Prophylaxis/Anticoagulation: SCDs- no anticoagulants due to bleed 3. Pain Management/chronic low back pain: Lyrica 25 mg twice a day 4. Mood: Provide emotional support 5. Neuropsych: This patient iscapable of making decisions on herown behalf. 6. Skin/Wound Care: Routine skin checks 7. Fluids/Electrolytes/Nutrition: Routine I&O with follow-up chemistries 8.Hypertension/PAF. Cozaar 25 mg daily, Lopressor 12.5 mg twice a day.Followed by Dr.Gollan.Monitor with increased mobility.Cardiac rate control Vitals:   09/06/17 2121 09/07/17 0651  BP: (!) 146/65 128/62  Pulse: 65 61  Resp:  16  Temp:  98.4 F (36.9 C)  SpO2:  100%   9.Diabetes mellitus with peripheral neuropathy. Hemoglobin A1c 6.8. SSI.Check blood sugars before meals and at bedtime Diet controlled PTA, monitor 10.AKI.Creatinine mildly elevated at 1.19. Follow-up chemistries. Encourage fluids 10.Hyperlipidemia. Zocor 11. History of GI bleed. Continue Protonix , Hgb 10.6, stable vs 9/13 12.  Bladder distension ? Pain with urination will check UA  LOS (Days) 3 A FACE TO FACE EVALUATION WAS PERFORMED  Jailene Cupit E 09/07/2017, 7:19 AM

## 2017-09-07 NOTE — Progress Notes (Signed)
PRN trazodone given at 2121. Rested quietly during night without attempts OOB. Yvette Collier

## 2017-09-08 ENCOUNTER — Inpatient Hospital Stay (HOSPITAL_COMMUNITY): Payer: Medicare Other | Admitting: Occupational Therapy

## 2017-09-08 ENCOUNTER — Inpatient Hospital Stay (HOSPITAL_COMMUNITY): Payer: Medicare Other | Admitting: Physical Therapy

## 2017-09-08 ENCOUNTER — Inpatient Hospital Stay (HOSPITAL_COMMUNITY): Payer: Medicare Other

## 2017-09-08 ENCOUNTER — Inpatient Hospital Stay (HOSPITAL_COMMUNITY): Payer: Medicare Other | Admitting: Speech Pathology

## 2017-09-08 LAB — URINE CULTURE

## 2017-09-08 LAB — GLUCOSE, CAPILLARY
GLUCOSE-CAPILLARY: 114 mg/dL — AB (ref 65–99)
GLUCOSE-CAPILLARY: 186 mg/dL — AB (ref 65–99)
GLUCOSE-CAPILLARY: 141 mg/dL — AB (ref 65–99)
GLUCOSE-CAPILLARY: 152 mg/dL — AB (ref 65–99)

## 2017-09-08 MED ORDER — SENNOSIDES-DOCUSATE SODIUM 8.6-50 MG PO TABS
2.0000 | ORAL_TABLET | Freq: Every day | ORAL | Status: DC
  Administered 2017-09-08 – 2017-09-11 (×4): 2 via ORAL
  Filled 2017-09-08 (×4): qty 2

## 2017-09-08 MED ORDER — PANTOPRAZOLE SODIUM 40 MG PO PACK
40.0000 mg | PACK | Freq: Every day | ORAL | Status: DC
  Administered 2017-09-08 – 2017-09-12 (×5): 40 mg via ORAL
  Filled 2017-09-08 (×5): qty 20

## 2017-09-08 NOTE — Progress Notes (Signed)
Speech Language Pathology Daily Session Note  Patient Details  Name: Yvette Collier MRN: [id number] Date of Birth: 12/26/1940  Today's Date: 09/08/2017 SLP Individual Time: 0915-1015 SLP Individual Time Calculation (min): 60 min  Short Term Goals: Week 1: SLP Short Term Goal 1 (Week 1): STG=LTG due to ELOS   Skilled Therapeutic Interventions: Skilled treatment session focused on cognitive-linguistic goals. Patient with fluent jargon characterized by intermittent phonemic paraphasias and islands of functional and intelligible speech that were most functional during social and informal conversations. Patient unable to read at the word level but with Mod A verbal and visual cues could copy simple CVC words. SLP also utilized audio feedback to maximize awareness, however, patient unable to demonstrate any awareness of errors. Patient left upright in recliner with quick release belt in place and all needs within reach. Continue with current plan of care.      Function:  Cognition Comprehension Comprehension assist level: Understands basic less than 25% of the time/ requires cueing >75% of the time  Expression   Expression assist level: Expresses basis less than 25% of the time/requires cueing >75% of the time.  Social Interaction Social Interaction assist level: Interacts appropriately 25 - 49% of time - Needs frequent redirection.  Problem Solving Problem solving assist level: Solves basic less than 25% of the time - needs direction nearly all the time or does not effectively solve problems and may need a restraint for safety  Memory Memory assist level: Recognizes or recalls less than 25% of the time/requires cueing greater than 75% of the time    Pain No/Denies Pain   Therapy/Group: Individual Therapy  [name] 09/08/2017, 3:51 PM

## 2017-09-08 NOTE — Progress Notes (Signed)
Physical Therapy Session Note  Patient Details  Name: Yvette Collier MRN: [id number] Date of Birth: October 02, 1941  Today's Date: 09/08/2017 PT Individual Time: 1400-1500 PT Individual Time Calculation (min): 60 min   Short Term Goals: No short term goals set  Skilled Therapeutic Interventions/Progress Updates:    pt seems to indicate pain behind L ear, noted hard nodule immediately posterior to mandible on L side, RN notified.  Session focus on cognitive remediation and activity tolerance with functional mobility.    Pt ambulates throughout unit without AD with min guard fade to close supervision, max distance 300'.  Pt engaged in functional task of dish washing at sink in standing with supervision.  Nustep x8 minutes focus on attention to task and timer.  Pt requires mod cues to attend to timer.  Pt completed card sorting task, which pt was able to complete with 50% accuracy with increased time and max multimodal cues.  Attempted to have pt work on a puzzle in sitting but pt unable to do so, even with max multimodal cues.  Returned to room at end of session, positioned in recliner with QRB in place, call bell in reach and needs met.   Therapy Documentation Precautions:  Precautions Precautions: Fall Precaution Comments: QRB in w/c or recliner, impaired cognition and globally aphasic Restrictions Weight Bearing Restrictions: No   See Function Navigator for Current Functional Status.   Therapy/Group: Individual Therapy  Michel Santee 09/08/2017, 3:03 PM

## 2017-09-08 NOTE — Progress Notes (Signed)
Subjective/Complaints:  No issues overnite per RN, required urinary cath several times yesterday per RN, no BM x2-3 d   ROS- cannot obtain remains aphasic  Objective: Vital Signs: Blood pressure (!) 125/56, pulse 63, temperature 98.7 F (37.1 C), temperature source Oral, resp. rate 16, height  (1.676 m), weight 70.3 kg (155 lb), SpO2 97 %. No results found. Results for orders placed or performed during the hospital encounter of 09/04/17 (from the past 72 hour(s))  Glucose, capillary     Status: None   Collection Time: 09/05/17  6:52 AM  Result Value Ref Range   Glucose-Capillary 91 65 - 99 mg/dL   Comment 1 Notify RN   Glucose, capillary     Status: Abnormal   Collection Time: 09/05/17 11:44 AM  Result Value Ref Range   Glucose-Capillary 146 (H) 65 - 99 mg/dL  Glucose, capillary     Status: Abnormal   Collection Time: 09/05/17  5:03 PM  Result Value Ref Range   Glucose-Capillary 127 (H) 65 - 99 mg/dL  Glucose, capillary     Status: Abnormal   Collection Time: 09/05/17  9:34 PM  Result Value Ref Range   Glucose-Capillary 110 (H) 65 - 99 mg/dL   Comment 1 Notify RN   Glucose, capillary     Status: Abnormal   Collection Time: 09/06/17  6:53 AM  Result Value Ref Range   Glucose-Capillary 120 (H) 65 - 99 mg/dL   Comment 1 Notify RN   Glucose, capillary     Status: Abnormal   Collection Time: 09/06/17 11:42 AM  Result Value Ref Range   Glucose-Capillary 265 (H) 65 - 99 mg/dL  Glucose, capillary     Status: Abnormal   Collection Time: 09/06/17  4:55 PM  Result Value Ref Range   Glucose-Capillary 128 (H) 65 - 99 mg/dL  Glucose, capillary     Status: Abnormal   Collection Time: 09/06/17  9:20 PM  Result Value Ref Range   Glucose-Capillary 136 (H) 65 - 99 mg/dL   Comment 1 Notify RN   Glucose, capillary     Status: Abnormal   Collection Time: 09/07/17  6:35 AM  Result Value Ref Range   Glucose-Capillary 113 (H) 65 - 99 mg/dL  Urinalysis, Routine w reflex microscopic      Status: None   Collection Time: 09/07/17  8:03 AM  Result Value Ref Range   Color, Urine YELLOW YELLOW   APPearance CLEAR CLEAR   Specific Gravity, Urine 1.009 1.005 - 1.030   pH 6.0 5.0 - 8.0   Glucose, UA NEGATIVE NEGATIVE mg/dL   Hgb urine dipstick NEGATIVE NEGATIVE   Bilirubin Urine NEGATIVE NEGATIVE   Ketones, ur NEGATIVE NEGATIVE mg/dL   Protein, ur NEGATIVE NEGATIVE mg/dL   Nitrite NEGATIVE NEGATIVE   Leukocytes, UA NEGATIVE NEGATIVE  Glucose, capillary     Status: Abnormal   Collection Time: 09/07/17 11:28 AM  Result Value Ref Range   Glucose-Capillary 173 (H) 65 - 99 mg/dL  Troponin I     Status: None   Collection Time: 09/07/17  1:07 PM  Result Value Ref Range   Troponin I <0.03 <0.03 ng/mL  Glucose, capillary     Status: Abnormal   Collection Time: 09/07/17  4:50 PM  Result Value Ref Range   Glucose-Capillary 135 (H) 65 - 99 mg/dL  Glucose, capillary     Status: Abnormal   Collection Time: 09/07/17  9:23 PM  Result Value Ref Range   Glucose-Capillary 138 (H) 65 -  99 mg/dL   Comment 1 Notify RN   Glucose, capillary     Status: Abnormal   Collection Time: 09/08/17  6:31 AM  Result Value Ref Range   Glucose-Capillary 114 (H) 65 - 99 mg/dL   Comment 1 Notify RN      HEENT: normal Cardio: RRR and no murmur Resp: CTA B/L and unlabored GI: BS positive and NT, ND Extremity:  No Edema Skin:   Intact Neuro: Confused, Normal Motor and Aphasic, cannot state her name, some automatic responses , is occ able to repeat, , + neologisms without self correction Musc/Skel:  Other no pain with UE ROM Gen NAD   Assessment/Plan: 1. 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 Physiatrist is providing close team supervision and 24 hour management of active medical problems listed below. Physiatrist and rehab team continue to assess barriers to  discharge/monitor patient progress toward functional and medical goals. FIM: Function - Bathing Position: Shower Body parts bathed by patient: Right arm, Left arm, Left lower leg, Chest, Abdomen, Front perineal area, Buttocks, Left upper leg, Right upper leg, Right lower leg Body parts bathed by helper: Back Assist Level: Touching or steadying assistance(Pt > 75%)  Function- Upper Body Dressing/Undressing What is the patient wearing?: Bra, Pull over shirt/dress Bra - Perfomed by patient: Thread/unthread right bra strap, Thread/unthread left bra strap Bra - Perfomed by helper: Hook/unhook bra (pull down sports bra) Pull over shirt/dress - Perfomed by patient: Thread/unthread right sleeve, Thread/unthread left sleeve, Put head through opening, Pull shirt over trunk Assist Level: Touching or steadying assistance(Pt > 75%) Function - Lower Body Dressing/Undressing What is the patient wearing?: Pants, Non-skid slipper socks, Shoes, Underwear Position: Sitting EOB Underwear - Performed by patient: Thread/unthread right underwear leg, Thread/unthread left underwear leg, Pull underwear up/down Pants- Performed by patient: Thread/unthread right pants leg, Thread/unthread left pants leg, Pull pants up/down Non-skid slipper socks- Performed by patient: Don/doff right sock, Don/doff left sock Shoes - Performed by patient: Don/doff right shoe, Don/doff left shoe Shoes - Performed by helper: Fasten right, Fasten left Assist for footwear: Partial/moderate assist Assist for lower body dressing: Touching or steadying assistance (Pt > 75%)  Function - Toileting Toileting steps completed by patient: Adjust clothing prior to toileting, Performs perineal hygiene, Adjust clothing after toileting Assist level: Touching or steadying assistance (Pt.75%)  Function - Toilet Transfers Toilet transfer assistive device: Elevated toilet seat/BSC over toilet Assist level to toilet: Touching or steadying assistance  (Pt > 75%) Assist level from toilet: Touching or steadying assistance (Pt > 75%)  Function - Chair/bed transfer Chair/bed transfer method: Ambulatory, Stand pivot Chair/bed transfer assist level: Supervision or verbal cues Chair/bed transfer assistive device: Armrests, Walker Chair/bed transfer details: Visual cues for safe use of DME/AE, Visual cues/gestures for precautions/safety  Function - Locomotion: Wheelchair Will patient use wheelchair at discharge?: No Function - Locomotion: Ambulation Assistive device: Hand held assist Max distance: >300' Assist level: Touching or steadying assistance (Pt > 75%) Assist level: Touching or steadying assistance (Pt > 75%) Assist level: Touching or steadying assistance (Pt > 75%) Assist level: Touching or steadying assistance (Pt > 75%) Assist level: Touching or steadying assistance (Pt > 75%)  Function - Comprehension Comprehension: Auditory Comprehension assist level: Understands basic less than 25% of the time/ requires cueing >75% of the time  Function - Expression Expression: Verbal Expression assist level: Expresses basis less than 25% of the time/requires cueing >  75% of the time.  Function - Social Interaction Social Interaction assist level: Interacts appropriately less than 25% of the time. May be withdrawn or combative.  Function - Problem Solving Problem solving assist level: Solves basic less than 25% of the time - needs direction nearly all the time or does not effectively solve problems and may need a restraint for safety  Function - Memory Memory assist level: Recognizes or recalls less than 25% of the time/requires cueing greater than 75% of the time Patient normally able to recall (first 3 days only): None of the above  Medical Problem List and Plan: 1. 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 DVT  Prophylaxis/Anticoagulation: SCDs- no anticoagulants due to bleed 3. Pain Management/chronic low back pain: Lyrica 25 mg twice a day 4. Mood: Provide emotional support 5. Neuropsych: This patient iscapable of making decisions on herown behalf. 6. Skin/Wound Care: Routine skin checks 7. Fluids/Electrolytes/Nutrition: Routine I&O with follow-up chemistries 8.Hypertension/PAF. Cozaar 25 mg daily, Lopressor 12.5 mg twice a day.Followed by Dr.Gollan.Monitor with increased mobility.Cardiac rate control Vitals:   09/07/17 2124 09/08/17 0500  BP: (!) 170/69 (!) 125/56  Pulse: 66 63  Resp:  16  Temp:  98.7 F (37.1 C)  SpO2:  97%   9.Diabetes mellitus with peripheral neuropathy. Hemoglobin A1c 6.8. SSI.Check blood sugars before meals and at bedtime Diet controlled PTA, monitor 10.AKI.Creatinine mildly elevated at 1.19. Follow-up chemistries. Encourage fluids 10.Hyperlipidemia. Zocor 11. History of GI bleed. Continue Protonix , Hgb 10.6, stable vs 9/13 12.  Bladder distension ? Pain with urination Normal UA , trazodone may be causing retention, will also start laxative LOS (Days) 4 A FACE TO FACE EVALUATION WAS PERFORMED  Zair Borawski E 09/08/2017, 6:33 AM

## 2017-09-08 NOTE — Progress Notes (Signed)
Occupational Therapy Session Note  Patient Details  Name: Yvette Collier MRN: [id number] Date of Birth: 05-16-1941  Today's Date: 09/08/2017 OT Individual Time: 0803-0900 OT Individual Time Calculation (min): 57 min   Short Term Goals: Week 1:  OT Short Term Goal 1 (Week 1): STG=LTG 2/2 ELOS  Skilled Therapeutic Interventions/Progress Updates:    Pt transferred to sitting EOB with supervision to finish eating breakfast sitting upright without back support. Pt able to maintain sitting balance without LOB. When asked how pt slept, she initially made sense, then sentences became word salad and nonsensical. Redirected pt to task at hand and was able to return to finishing breakfast. Pt ambulated to bathroom with close supervision and min guard for lateral LOB when turning to sit onto toilet. Pt voided bladder and completed 3/3 steps of toileting without assistance. Pt needed min verbal cues for safety when doffing clothing, then ambulated into shower with close supervision. Set-up with soap and wash cloth and pt able to wash all body parts without verbal cues and supervision. Pt ambulated out of shower with close supervision and verbal cues to sit on EOB to dress. Pt needed assistance to fasten bra and orient shirt. Pt ambulated to recliner and reported increased heart burn. RN notified and pt left seated in recliner with safety belt fastened and call bell in hand.   Therapy Documentation Precautions:  Precautions Precautions: Fall Precaution Comments: QRB in w/c or recliner, impaired cognition and globally aphasic Restrictions Weight Bearing Restrictions: No  See Function Navigator for Current Functional Status.   Therapy/Group: Individual Therapy  Mal Amabile 09/08/2017, 12:22 PM

## 2017-09-08 NOTE — Progress Notes (Signed)
PRN trazodone given at 2125. At 0030 up to BR, voided 250cc's, I & O cath=500cc's. At 0630 up to void, bladder scan=479, I & O cath=500cc's. No attempts to get up without assistance. No complaint of pain during night. Alfredo Martinez A

## 2017-09-08 NOTE — Plan of Care (Signed)
Problem: RH BLADDER ELIMINATION Goal: RH STG MANAGE BLADDER WITH ASSISTANCE STG Manage Bladder With mod I Assistance  Outcome: Not Progressing New urinary retention, requiring I & O cath.

## 2017-09-08 NOTE — Progress Notes (Signed)
Physical Therapy Note  Patient Details  Name: Yvette Collier MRN: [id number] Date of Birth: 13-May-1941 Today's Date: 09/08/2017  1140-1205, 25 min individual tx Pain:: no S/S of pain  Pt with fluent expressive aphasia throughout session, with occasional short automatic sentences correct except for 1 word.  Gait to/from room with RW over level ground with close supervision, min assist.  Therapeutic activity in standing on wedge for sustained bil heel cord stretch and balance challenge, while folding laundry on table in front of her.  Pt without LOB, and efficiently folded laundry carefully.  Gait without AD to return to room transporting slide board with folded laundry on it, without spillage, with min assist, mod multimodal cues for route finding.    Pt left resting in recliner with quick release belt applied and all needs within reach.   See function navigator for current status.  Ellery Tash 09/08/2017, 7:51 AM

## 2017-09-09 ENCOUNTER — Inpatient Hospital Stay (HOSPITAL_COMMUNITY): Payer: Medicare Other | Admitting: Speech Pathology

## 2017-09-09 ENCOUNTER — Inpatient Hospital Stay (HOSPITAL_COMMUNITY): Payer: Medicare Other | Admitting: Physical Therapy

## 2017-09-09 ENCOUNTER — Inpatient Hospital Stay (HOSPITAL_COMMUNITY): Payer: Medicare Other | Admitting: Occupational Therapy

## 2017-09-09 LAB — GLUCOSE, CAPILLARY
GLUCOSE-CAPILLARY: 168 mg/dL — AB (ref 65–99)
Glucose-Capillary: 120 mg/dL — ABNORMAL HIGH (ref 65–99)
Glucose-Capillary: 155 mg/dL — ABNORMAL HIGH (ref 65–99)
Glucose-Capillary: 175 mg/dL — ABNORMAL HIGH (ref 65–99)

## 2017-09-09 NOTE — Progress Notes (Signed)
Physical Therapy Session Note  Patient Details  Name: Yvette Collier MRN: [id number] Date of Birth: 1941-10-10  Today's Date: 09/09/2017 PT Individual Time: 1500-1530 PT Individual Time Calculation (min): 30 min   Short Term Goals: No short term goals set  Skilled Therapeutic Interventions/Progress Updates:    no c/o pain.  Session focus on activity tolerance and cognitive remediation.    Pt completed 6 minute walk test and ambulates 530'/6 minutes for gait speed of 0.04 m/s. Pt requires frequent cues to attend to task and continue ambulating.  Pt engaged in seated functional task sorting silverware focus on visual scanning, problem solving, and attention, with mod multimodal cues to correctly sort silverware and attend to task.  Pt returned to room at end of session and positioned in recliner with QRB in place, call bell in reach and needs met.    Therapy Documentation Precautions:  Precautions Precautions: Fall Precaution Comments: QRB in w/c or recliner, impaired cognition and globally aphasic Restrictions Weight Bearing Restrictions: No   See Function Navigator for Current Functional Status.   Therapy/Group: Individual Therapy  Michel Santee 09/09/2017, 4:28 PM

## 2017-09-09 NOTE — Progress Notes (Signed)
Occupational Therapy Session Note  Patient Details  Name: Yvette Collier MRN: [id number] Date of Birth: [birth year]  Today's Date: 09/09/2017 OT Individual Time: 0802-0900 OT Individual Time Calculation (min): 58 min   Short Term Goals: Week 1:  OT Short Term Goal 1 (Week 1): STG=LTG 2/2 ELOS  Skilled Therapeutic Interventions/Progress Updates:    Pt finishing breakfast upon OT arrival with supervision. Pt ambulated to dresser to collect clothing with instructional cues to recall what she was doing at the dresser and open top drawer. Pt perseverative on different outfits requiring set-up A to complete outfit with one of each piece of clothing. Bathing/dressing completed with min instructional cues for safety and technique  B UE strengthening and hand coordination with ringing out wash cloths and drying shower wall. Standing balance and visual scanning to R with standing grooming tasks. Pt needed supervision for balance with no LOB noted. Pt needed instructional cues to scan to the R to locate hairspray container, then able to use object appropriately to spray and comb hair. Pt left seated in recliner chair at end of session with safety belt on and needs met.   Therapy Documentation Precautions:  Precautions Precautions: Fall Precaution Comments: QRB in w/c or recliner, impaired cognition and globally aphasic Restrictions Weight Bearing Restrictions: No Pain: Pain Assessment Pain Assessment: No/denies pain Faces Pain Scale: No hurt  See Function Navigator for Current Functional Status.   Therapy/Group: Individual Therapy  Valma Cava 09/09/2017, 8:30 AM

## 2017-09-09 NOTE — Progress Notes (Signed)
Physical Therapy Session Note  Patient Details  Name: Yvette Collier MRN: [id number] Date of Birth: 1941-02-01  Today's Date: 09/09/2017 PT Individual Time: 0900-1000 PT Individual Time Calculation (min): 60 min   Short Term Goals: No short term goals set  Skilled Therapeutic Interventions/Progress Updates:    no c/o pain.  Session focus on cognitive remediation, activity tolerance, and functional mobility.    Pt transfers throughout session supervision.  Gait throughout unit, max distance 400', with supervision, occasional min guard when distracted.  PT provided mod multimodal cues for pt to recreate a colored peg board design, fade to max multimodal cues with pt fatigue.  Pt requires nearly 30 minutes to recreate one design with one rest break for mental fatigue.    Nustep x8 minutes with 4 extremities for activity tolerance and focus on attention to task with max multimodal cues to cease activity at set time.    Pt returned to room at end of session and positioned in recliner with QRB in place, call bell in reach and needs met.   Therapy Documentation Precautions:  Precautions Precautions: Fall Precaution Comments: QRB in w/c or recliner, impaired cognition and globally aphasic Restrictions Weight Bearing Restrictions: No   See Function Navigator for Current Functional Status.   Therapy/Group: Individual Therapy  Michel Santee 09/09/2017, 10:01 AM

## 2017-09-09 NOTE — Progress Notes (Signed)
Speech Language Pathology Daily Session Note  Patient Details  Name: Yvette Collier MRN: [id number] Date of Birth: 1941-03-13  Today's Date: 09/09/2017 SLP Individual Time: 1300-1400 SLP Individual Time Calculation (min): 60 min  Short Term Goals: Week 1: SLP Short Term Goal 1 (Week 1): STG=LTG due to ELOS   Skilled Therapeutic Interventions: Skilled treatment session focused on dysphagia and communication goals. SLP facilitated session by providing skilled observation with lunch meal of Dys. 2 textures with thin liquids. Patient consumed meal without overt s/s of aspiration. However, patient had to orally expectorate meat after more than a reasonable amount of time for mastication, suspect due to toughness of meat and textures appearing more like Dys. 3. Patient also performed basic self-care tasks with supervision for safety. During a functional conversation within a specific context (eating, indigestion, using the bathroom, washing hands and watering plants), patient communicated intelligibly and functionally ~50% of the time. Patient left upright in recliner with all needs within reach and quick release belt in place. Continue with current plan of care.      Function:  Eating Eating   Modified Consistency Diet: Yes Eating Assist Level: Supervision or verbal cues   Eating Set Up Assist For: Opening containers       Cognition Comprehension Comprehension assist level: Understands basic less than 25% of the time/ requires cueing >75% of the time  Expression   Expression assist level: Expresses basis less than 25% of the time/requires cueing >75% of the time.  Social Interaction Social Interaction assist level: Interacts appropriately 25 - 49% of time - Needs frequent redirection.  Problem Solving Problem solving assist level: Solves basic less than 25% of the time - needs direction nearly all the time or does not effectively solve problems and may need a restraint for safety  Memory  Memory assist level: Recognizes or recalls less than 25% of the time/requires cueing greater than 75% of the time    Pain No/Denies Pain   Therapy/Group: Individual Therapy  Jahmia Berrett 09/09/2017, 3:22 PM

## 2017-09-09 NOTE — Progress Notes (Signed)
Subjective/Complaints:  Aphasic with occ intelligible phrase, "daughter was here"   ROS- cannot obtain remains aphasic  Objective: Vital Signs: Blood pressure (!) 120/59, pulse 62, temperature 98.5 F (36.9 C), temperature source Oral, resp. rate 18, height  (1.676 m), weight 70.3 kg (155 lb), SpO2 100 %. Dg Chest 2 View  Result Date: 09/08/2017 CLINICAL DATA:  Fever. Pt seemed confused during exam. She was able to answer some questions but had a hard time understanding others. She stated that she's been sick for 2-3 days, w/ epigastric pain. She stated that it's been the worst today and she has pain whenever she eats. Hx of asthma, COPD, DM, GERD, Gastrointestinal bleed, colon polyps, HTN, hypertensive heart disease, IBS, ischemic colitis, non-obstructive coronary artery disease, PAF, transient cerebral ischemia due to atrial fibrillation. EXAM: CHEST  2 VIEW COMPARISON:  9/13/8 FINDINGS: Cardiac silhouette is normal in size. No mediastinal or hilar masses. No evidence of adenopathy. Clear lungs.  No pleural effusion or pneumothorax. Skeletal structures are intact. IMPRESSION: No active cardiopulmonary disease. Electronically Signed   By: Amie Portland M.D.   On: 09/08/2017 19:03   Dg Abd 1 View  Result Date: 09/08/2017 CLINICAL DATA:  Fever. Pt seemed confused during exam. She was able to answer some questions but had a hard time understanding others. She stated that she's been sick for 2-3 days, w/ epigastric pain. She stated that it's been the worst today and she has pain whenever she eats. Hx of asthma, COPD, DM, GERD, Gastrointestinal bleed, colon polyps, HTN, hypertensive heart disease, IBS, ischemic colitis, non-obstructive coronary artery disease, PAF, transient cerebral ischemia due to atrial fibrillation. EXAM: ABDOMEN - 1 VIEW COMPARISON:  09/01/2017.  CT, 03/11/2017. FINDINGS: Normal bowel gas pattern. No significant increase in colonic stool. There is a stable phlebolith to the  right of the lower lumbar spine. There stable changes from a cholecystectomy a well as lumbar spine posterior fusion and left hip arthroplasty. No evidence of a renal or ureteral stone. IMPRESSION: 1. No acute findings. No evidence of bowel obstruction. No significant increase in stool. Electronically Signed   By: Amie Portland M.D.   On: 09/08/2017 19:02   Results for orders placed or performed during the hospital encounter of 09/04/17 (from the past 72 hour(s))  Glucose, capillary     Status: Abnormal   Collection Time: 09/06/17 11:42 AM  Result Value Ref Range   Glucose-Capillary 265 (H) 65 - 99 mg/dL  Glucose, capillary     Status: Abnormal   Collection Time: 09/06/17  4:55 PM  Result Value Ref Range   Glucose-Capillary 128 (H) 65 - 99 mg/dL  Glucose, capillary     Status: Abnormal   Collection Time: 09/06/17  9:20 PM  Result Value Ref Range   Glucose-Capillary 136 (H) 65 - 99 mg/dL   Comment 1 Notify RN   Glucose, capillary     Status: Abnormal   Collection Time: 09/07/17  6:35 AM  Result Value Ref Range   Glucose-Capillary 113 (H) 65 - 99 mg/dL  Urinalysis, Routine w reflex microscopic     Status: None   Collection Time: 09/07/17  8:03 AM  Result Value Ref Range   Color, Urine YELLOW YELLOW   APPearance CLEAR CLEAR   Specific Gravity, Urine 1.009 1.005 - 1.030   pH 6.0 5.0 - 8.0   Glucose, UA NEGATIVE NEGATIVE mg/dL   Hgb urine dipstick NEGATIVE NEGATIVE   Bilirubin Urine NEGATIVE NEGATIVE   Ketones, ur NEGATIVE NEGATIVE mg/dL  Protein, ur NEGATIVE NEGATIVE mg/dL   Nitrite NEGATIVE NEGATIVE   Leukocytes, UA NEGATIVE NEGATIVE  Urine Culture     Status: Abnormal   Collection Time: 09/07/17  8:03 AM  Result Value Ref Range   Specimen Description URINE, CLEAN CATCH    Special Requests NONE    Culture MULTIPLE SPECIES PRESENT, SUGGEST RECOLLECTION (A)    Report Status 09/08/2017 FINAL   Glucose, capillary     Status: Abnormal   Collection Time: 09/07/17 11:28 AM  Result  Value Ref Range   Glucose-Capillary 173 (H) 65 - 99 mg/dL  Troponin I     Status: None   Collection Time: 09/07/17  1:07 PM  Result Value Ref Range   Troponin I <0.03 <0.03 ng/mL  Glucose, capillary     Status: Abnormal   Collection Time: 09/07/17  4:50 PM  Result Value Ref Range   Glucose-Capillary 135 (H) 65 - 99 mg/dL  Glucose, capillary     Status: Abnormal   Collection Time: 09/07/17  9:23 PM  Result Value Ref Range   Glucose-Capillary 138 (H) 65 - 99 mg/dL   Comment 1 Notify RN   Glucose, capillary     Status: Abnormal   Collection Time: 09/08/17  6:31 AM  Result Value Ref Range   Glucose-Capillary 114 (H) 65 - 99 mg/dL   Comment 1 Notify RN   Glucose, capillary     Status: Abnormal   Collection Time: 09/08/17 11:25 AM  Result Value Ref Range   Glucose-Capillary 186 (H) 65 - 99 mg/dL  Glucose, capillary     Status: Abnormal   Collection Time: 09/08/17  4:42 PM  Result Value Ref Range   Glucose-Capillary 141 (H) 65 - 99 mg/dL  Glucose, capillary     Status: Abnormal   Collection Time: 09/08/17  8:30 PM  Result Value Ref Range   Glucose-Capillary 152 (H) 65 - 99 mg/dL     HEENT: normal Cardio: RRR and no murmur Resp: CTA B/L and unlabored GI: BS positive and NT, ND Extremity:  No Edema Skin:   Intact Neuro: Confused, Normal Motor and Aphasic, cannot state her name, some automatic responses , is occ able to repeat, , + neologisms without self correction dificulty with MMT but does move all 4 ext against resistance Musc/Skel:  Other no pain with UE ROM Gen NAD   Assessment/Plan: 1. 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 Physiatrist is providing close team supervision and 24 hour management of active medical problems listed below. Physiatrist and rehab team continue to assess barriers to discharge/monitor patient progress toward functional  and medical goals. FIM: Function - Bathing Position: Shower Body parts bathed by patient: Right arm, Left arm, Chest, Abdomen, Front perineal area, Buttocks, Right upper leg, Left upper leg, Right lower leg, Left lower leg Body parts bathed by helper: Back Assist Level: Supervision or verbal cues  Function- Upper Body Dressing/Undressing What is the patient wearing?: Bra, Pull over shirt/dress Bra - Perfomed by patient: Thread/unthread right bra strap, Thread/unthread left bra strap Bra - Perfomed by helper: Hook/unhook bra (pull down sports bra) Pull over shirt/dress - Perfomed by patient: Thread/unthread right sleeve, Pull shirt over trunk, Thread/unthread left sleeve, Put head through opening Assist Level: Touching or steadying assistance(Pt > 75%) Function - Lower Body Dressing/Undressing What is the patient wearing?: Pants, Socks, Shoes, Underwear Position: Sitting EOB Underwear - Performed by patient: Thread/unthread  right underwear leg, Thread/unthread left underwear leg, Pull underwear up/down Pants- Performed by patient: Thread/unthread right pants leg, Thread/unthread left pants leg, Pull pants up/down Non-skid slipper socks- Performed by patient: Don/doff right sock, Don/doff left sock Socks - Performed by patient: Don/doff right sock, Don/doff left sock Shoes - Performed by patient: Don/doff right shoe, Don/doff left shoe, Fasten left, Fasten right Shoes - Performed by helper: Fasten right, Fasten left Assist for footwear: Supervision/touching assist Assist for lower body dressing: Touching or steadying assistance (Pt > 75%)  Function - Toileting Toileting steps completed by patient: Performs perineal hygiene, Adjust clothing prior to toileting, Adjust clothing after toileting Assist level: Touching or steadying assistance (Pt.75%)  Function - Toilet Transfers Toilet transfer assistive device: Elevated toilet seat/BSC over toilet Assist level to toilet: Touching or  steadying assistance (Pt > 75%) Assist level from toilet: Touching or steadying assistance (Pt > 75%)  Function - Chair/bed transfer Chair/bed transfer method: Ambulatory Chair/bed transfer assist level: Supervision or verbal cues Chair/bed transfer assistive device: Armrests, Walker Chair/bed transfer details: Visual cues for safe use of DME/AE, Visual cues/gestures for precautions/safety  Function - Locomotion: Wheelchair Will patient use wheelchair at discharge?: No Function - Locomotion: Ambulation Assistive device: Walker-rolling Max distance: 120 Assist level: Touching or steadying assistance (Pt > 75%) Assist level: Touching or steadying assistance (Pt > 75%) Assist level: Touching or steadying assistance (Pt > 75%) Assist level: Touching or steadying assistance (Pt > 75%) Assist level: Touching or steadying assistance (Pt > 75%)  Function - Comprehension Comprehension: Auditory Comprehension assist level: Understands basic less than 25% of the time/ requires cueing >75% of the time  Function - Expression Expression: Verbal Expression assist level: Expresses basis less than 25% of the time/requires cueing >75% of the time.  Function - Social Interaction Social Interaction assist level: Interacts appropriately 25 - 49% of time - Needs frequent redirection.  Function - Problem Solving Problem solving assist level: Solves basic less than 25% of the time - needs direction nearly all the time or does not effectively solve problems and may need a restraint for safety  Function - Memory Memory assist level: Recognizes or recalls less than 25% of the time/requires cueing greater than 75% of the time Patient normally able to recall (first 3 days only): None of the above  Medical Problem List and Plan: 1. 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  DVT Prophylaxis/Anticoagulation: SCDs- no anticoagulants due to bleed 3. Pain Management/chronic low back pain: Lyrica 25 mg twice a day 4. Mood: Provide emotional support 5. Neuropsych: This patient iscapable of making decisions on herown behalf. 6. Skin/Wound Care: Routine skin checks 7. Fluids/Electrolytes/Nutrition: Routine I&O with follow-up chemistries 8.Hypertension/PAF. Cozaar 25 mg daily, Lopressor 12.5 mg twice a day.Followed by Dr.Gollan.Monitor with increased mobility.Cardiac rate control Vitals:   09/08/17 1536 09/09/17 0454  BP: 130/77 (!) 120/59  Pulse: 74 62  Resp: 17 18  Temp: 100.2 F (37.9 C) 98.5 F (36.9 C)  SpO2: 99% 100%  BP controlled 9/18 9.Diabetes mellitus with peripheral neuropathy. Hemoglobin A1c 6.8. SSI.Check blood sugars before meals and at bedtime Diet controlled PTA, monitor 10.AKI.Creatinine mildly elevated at 1.19. Follow-up chemistries. Encourage fluids 10.Hyperlipidemia. Zocor 11. History of GI bleed. Continue Protonix , Hgb 10.6, stable vs 9/13 12.  Bladder distension ? Pain with urination Normal UA , trazodone may be causing retention, will also start laxative 13.  Low grade temp x 1 UA neg, CXR and KUB neg LOS (Days) 5 A FACE TO FACE EVALUATION WAS PERFORMED  Kavir Savoca E 09/09/2017, 6:55 AM

## 2017-09-10 ENCOUNTER — Inpatient Hospital Stay (HOSPITAL_COMMUNITY): Payer: Medicare Other | Admitting: Occupational Therapy

## 2017-09-10 ENCOUNTER — Inpatient Hospital Stay (HOSPITAL_COMMUNITY): Payer: Medicare Other | Admitting: Physical Therapy

## 2017-09-10 ENCOUNTER — Inpatient Hospital Stay (HOSPITAL_COMMUNITY): Payer: Medicare Other | Admitting: Speech Pathology

## 2017-09-10 LAB — URINE CULTURE: Culture: >=100000 — AB

## 2017-09-10 LAB — GLUCOSE, CAPILLARY
GLUCOSE-CAPILLARY: 110 mg/dL — AB (ref 65–99)
Glucose-Capillary: 108 mg/dL — ABNORMAL HIGH (ref 65–99)
Glucose-Capillary: 201 mg/dL — ABNORMAL HIGH (ref 65–99)
Glucose-Capillary: 109 mg/dL — ABNORMAL HIGH (ref 65–99)

## 2017-09-10 NOTE — Progress Notes (Signed)
Speech Language Pathology Daily Session Note  Patient Details  Name: Yvette Collier MRN: [id number] Date of Birth: 04/08/1941  Today's Date: 09/10/2017 SLP Individual Time: 1155-1220 SLP Individual Time Calculation (min): 25 min  Short Term Goals: Week 1: SLP Short Term Goal 1 (Week 1): STG=LTG due to ELOS   Skilled Therapeutic Interventions: Skilled treatment session focused on cognitive-linguistic goals. SLP facilitated session by providing extra time and Mod-Max A verbal cues for patient to identify and sort coins from a field of 4. Throughout task, patient demonstrated islands of apprproiate, functional language to ~25-50%. Patient continues to demonstrate limited awareness of errors and requires overall Total A to self-monitor. Patient left upright in recliner with quick release belt in place and all needs within reach. Continue with current plan of care.       Function:  Cognition Comprehension Comprehension assist level: Understands basic less than 25% of the time/ requires cueing >75% of the time  Expression   Expression assist level: Expresses basis less than 25% of the time/requires cueing >75% of the time.  Social Interaction Social Interaction assist level: Interacts appropriately 25 - 49% of time - Needs frequent redirection.  Problem Solving Problem solving assist level: Solves basic less than 25% of the time - needs direction nearly all the time or does not effectively solve problems and may need a restraint for safety  Memory Memory assist level: Recognizes or recalls less than 25% of the time/requires cueing greater than 75% of the time    Pain Pain Assessment Pain Score: 0-No pain Pain Location: Abdomen (c/o generalized abdominal discomfort, no numerical value provided)  Therapy/Group: Individual Therapy  Yvette Collier 09/10/2017, 12:39 PM

## 2017-09-10 NOTE — Progress Notes (Signed)
Subjective/Complaints:   Points to L side of neck  ROS- cannot obtain remains aphasic  Objective: Vital Signs: Blood pressure 128/67, pulse 93, temperature 98.6 F (37 C), temperature source Oral, resp. rate 18, height 5' 6"  (1.676 m), weight 70.3 kg (155 lb), SpO2 98 %. Dg Chest 2 View  Result Date: 09/08/2017 CLINICAL DATA:  Fever. Pt seemed confused during exam. She was able to answer some questions but had a hard time understanding others. She stated that she's been sick for 2-3 days, w/ epigastric pain. She stated that it's been the worst today and she has pain whenever she eats. Hx of asthma, COPD, DM, GERD, Gastrointestinal bleed, colon polyps, HTN, hypertensive heart disease, IBS, ischemic colitis, non-obstructive coronary artery disease, PAF, transient cerebral ischemia due to atrial fibrillation. EXAM: CHEST  2 VIEW COMPARISON:  9/13/8 FINDINGS: Cardiac silhouette is normal in size. No mediastinal or hilar masses. No evidence of adenopathy. Clear lungs.  No pleural effusion or pneumothorax. Skeletal structures are intact. IMPRESSION: No active cardiopulmonary disease. Electronically Signed   By: Lajean Manes M.D.   On: 09/08/2017 19:03   Dg Abd 1 View  Result Date: 09/08/2017 CLINICAL DATA:  Fever. Pt seemed confused during exam. She was able to answer some questions but had a hard time understanding others. She stated that she's been sick for 2-3 days, w/ epigastric pain. She stated that it's been the worst today and she has pain whenever she eats. Hx of asthma, COPD, DM, GERD, Gastrointestinal bleed, colon polyps, HTN, hypertensive heart disease, IBS, ischemic colitis, non-obstructive coronary artery disease, PAF, transient cerebral ischemia due to atrial fibrillation. EXAM: ABDOMEN - 1 VIEW COMPARISON:  09/01/2017.  CT, 03/11/2017. FINDINGS: Normal bowel gas pattern. No significant increase in colonic stool. There is a stable phlebolith to the right of the lower lumbar spine. There  stable changes from a cholecystectomy a well as lumbar spine posterior fusion and left hip arthroplasty. No evidence of a renal or ureteral stone. IMPRESSION: 1. No acute findings. No evidence of bowel obstruction. No significant increase in stool. Electronically Signed   By: Lajean Manes M.D.   On: 09/08/2017 19:02   Results for orders placed or performed during the hospital encounter of 09/04/17 (from the past 72 hour(s))  Urinalysis, Routine w reflex microscopic     Status: None   Collection Time: 09/07/17  8:03 AM  Result Value Ref Range   Color, Urine YELLOW YELLOW   APPearance CLEAR CLEAR   Specific Gravity, Urine 1.009 1.005 - 1.030   pH 6.0 5.0 - 8.0   Glucose, UA NEGATIVE NEGATIVE mg/dL   Hgb urine dipstick NEGATIVE NEGATIVE   Bilirubin Urine NEGATIVE NEGATIVE   Ketones, ur NEGATIVE NEGATIVE mg/dL   Protein, ur NEGATIVE NEGATIVE mg/dL   Nitrite NEGATIVE NEGATIVE   Leukocytes, UA NEGATIVE NEGATIVE  Urine Culture     Status: Abnormal   Collection Time: 09/07/17  8:03 AM  Result Value Ref Range   Specimen Description URINE, CLEAN CATCH    Special Requests NONE    Culture MULTIPLE SPECIES PRESENT, SUGGEST RECOLLECTION (A)    Report Status 09/08/2017 FINAL   Glucose, capillary     Status: Abnormal   Collection Time: 09/07/17 11:28 AM  Result Value Ref Range   Glucose-Capillary 173 (H) 65 - 99 mg/dL  Troponin I     Status: None   Collection Time: 09/07/17  1:07 PM  Result Value Ref Range   Troponin I <0.03 <0.03 ng/mL  Glucose, capillary  Status: Abnormal   Collection Time: 09/07/17  4:50 PM  Result Value Ref Range   Glucose-Capillary 135 (H) 65 - 99 mg/dL  Glucose, capillary     Status: Abnormal   Collection Time: 09/07/17  9:23 PM  Result Value Ref Range   Glucose-Capillary 138 (H) 65 - 99 mg/dL   Comment 1 Notify RN   Glucose, capillary     Status: Abnormal   Collection Time: 09/08/17  6:31 AM  Result Value Ref Range   Glucose-Capillary 114 (H) 65 - 99 mg/dL    Comment 1 Notify RN   Glucose, capillary     Status: Abnormal   Collection Time: 09/08/17 11:25 AM  Result Value Ref Range   Glucose-Capillary 186 (H) 65 - 99 mg/dL  Glucose, capillary     Status: Abnormal   Collection Time: 09/08/17  4:42 PM  Result Value Ref Range   Glucose-Capillary 141 (H) 65 - 99 mg/dL  Glucose, capillary     Status: Abnormal   Collection Time: 09/08/17  8:30 PM  Result Value Ref Range   Glucose-Capillary 152 (H) 65 - 99 mg/dL  Glucose, capillary     Status: Abnormal   Collection Time: 09/09/17  7:12 AM  Result Value Ref Range   Glucose-Capillary 120 (H) 65 - 99 mg/dL  Glucose, capillary     Status: Abnormal   Collection Time: 09/09/17 11:40 AM  Result Value Ref Range   Glucose-Capillary 155 (H) 65 - 99 mg/dL  Glucose, capillary     Status: Abnormal   Collection Time: 09/09/17  4:25 PM  Result Value Ref Range   Glucose-Capillary 175 (H) 65 - 99 mg/dL  Glucose, capillary     Status: Abnormal   Collection Time: 09/09/17  9:04 PM  Result Value Ref Range   Glucose-Capillary 168 (H) 65 - 99 mg/dL  Glucose, capillary     Status: Abnormal   Collection Time: 09/10/17  6:31 AM  Result Value Ref Range   Glucose-Capillary 108 (H) 65 - 99 mg/dL     HEENT: Left  Cervical node enlarged and tender  Cardio: RRR and no murmur Resp: CTA B/L and unlabored GI: BS positive and NT, ND Extremity:  No Edema Skin:   Intact Neuro: Confused, Normal Motor and Aphasic, cannot state her name, some automatic responses , is occ able to repeat, , + neologisms without self correction dificulty with MMT but does move all 4 ext against resistance Musc/Skel:  Other no pain with UE ROM Gen NAD   Assessment/Plan: 1. 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 Physiatrist is providing close team supervision and 24 hour management of active medical problems listed  below. Physiatrist and rehab team continue to assess barriers to discharge/monitor patient progress toward functional and medical goals. FIM: Function - Bathing Position: Shower Body parts bathed by patient: Right arm, Left arm, Chest, Abdomen, Front perineal area, Buttocks, Right upper leg, Left upper leg, Right lower leg, Left lower leg Body parts bathed by helper: Back Assist Level: Supervision or verbal cues  Function- Upper Body Dressing/Undressing What is the patient wearing?: Bra, Pull over shirt/dress Bra - Perfomed by patient: Thread/unthread right bra strap, Thread/unthread left bra strap, Hook/unhook bra (pull down sports bra) Bra - Perfomed by helper: Hook/unhook bra (pull down sports bra) Pull over shirt/dress - Perfomed by patient: Thread/unthread right sleeve, Thread/unthread left sleeve, Pull shirt over trunk, Put head through  opening Assist Level: Supervision or verbal cues Function - Lower Body Dressing/Undressing What is the patient wearing?: Pants, Socks, Shoes, Underwear Position: Other (comment) (straightback chair at sink) Underwear - Performed by patient: Thread/unthread right underwear leg, Thread/unthread left underwear leg, Pull underwear up/down Pants- Performed by patient: Thread/unthread right pants leg, Thread/unthread left pants leg, Pull pants up/down Non-skid slipper socks- Performed by patient: Don/doff right sock, Don/doff left sock Socks - Performed by patient: Don/doff left sock, Don/doff right sock Shoes - Performed by patient: Don/doff right shoe, Don/doff left shoe, Fasten left, Fasten right Shoes - Performed by helper: Fasten right, Fasten left Assist for footwear: Supervision/touching assist Assist for lower body dressing: Supervision or verbal cues  Function - Toileting Toileting steps completed by patient: Performs perineal hygiene, Adjust clothing prior to toileting, Adjust clothing after toileting Assist level: Supervision or verbal  cues  Function - Toilet Transfers Toilet transfer assistive device: Grab bar Assist level to toilet: Touching or steadying assistance (Pt > 75%) Assist level from toilet: Touching or steadying assistance (Pt > 75%)  Function - Chair/bed transfer Chair/bed transfer method: Ambulatory Chair/bed transfer assist level: Supervision or verbal cues Chair/bed transfer assistive device: Armrests, Walker Chair/bed transfer details: Visual cues for safe use of DME/AE, Visual cues/gestures for precautions/safety  Function - Locomotion: Wheelchair Will patient use wheelchair at discharge?: No Function - Locomotion: Ambulation Assistive device: Walker-rolling Max distance: 120 Assist level: Touching or steadying assistance (Pt > 75%) Assist level: Touching or steadying assistance (Pt > 75%) Assist level: Touching or steadying assistance (Pt > 75%) Assist level: Touching or steadying assistance (Pt > 75%) Assist level: Touching or steadying assistance (Pt > 75%)  Function - Comprehension Comprehension: Auditory Comprehension assist level: Understands basic less than 25% of the time/ requires cueing >75% of the time  Function - Expression Expression: Verbal Expression assist level: Expresses basis less than 25% of the time/requires cueing >75% of the time.  Function - Social Interaction Social Interaction assist level: Interacts appropriately 25 - 49% of time - Needs frequent redirection.  Function - Problem Solving Problem solving assist level: Solves basic less than 25% of the time - needs direction nearly all the time or does not effectively solve problems and may need a restraint for safety  Function - Memory Memory assist level: Recognizes or recalls less than 25% of the time/requires cueing greater than 75% of the time Patient normally able to recall (first 3 days only): None of the above  Medical Problem List and Plan: 1. Right hemiparesis and receptive expressive language  deficitssecondary to left temporal lobe hemorrhage secondary to hypertensive crisis CIR PT, OT, SLP Team conference today please see physician documentation under team conference tab, met with team face-to-face to discuss problems,progress, and goals. Formulized individual treatment plan based on medical history, underlying problem and comorbidities. 2. DVT Prophylaxis/Anticoagulation: SCDs- no anticoagulants due to bleed 3. Pain Management/chronic low back pain: Lyrica 25 mg twice a day 4. Mood: Provide emotional support 5. Neuropsych: This patient iscapable of making decisions on herown behalf. 6. Skin/Wound Care: Routine skin checks 7. Fluids/Electrolytes/Nutrition: Routine I&O with follow-up chemistries 8.Hypertension/PAF. Cozaar 25 mg daily, Lopressor 12.5 mg twice a day.Followed by Dr.Gollan.Monitor with increased mobility.Cardiac rate control Vitals:   09/09/17 0454 09/09/17 1413  BP: (!) 120/59 128/67  Pulse: 62 93  Resp: 18 18  Temp: 98.5 F (36.9 C) 98.6 F (37 C)  SpO2: 100% 98%  BP controlled 9/19 9.Diabetes mellitus with peripheral neuropathy. Hemoglobin A1c 6.8. SSI.Check blood sugars  before meals and at bedtime Diet controlled PTA, monitor 10.AKI.Creatinine mildly elevated at 1.19. Follow-up chemistries. Encourage fluids 10.Hyperlipidemia. Zocor 11. History of GI bleed. Continue Protonix , Hgb 10.6, stable vs 9/13 12.  Urinary retention last cath 9/17 UA - 13.  Left cervical adenopathy, had low grad temp, last CBC 9/14 showed normal WBCs, suspect viral URI, pt unable to give hx of sore throat  1 LOS (Days) 6 A FACE TO FACE EVALUATION WAS PERFORMED  Vibha Ferdig E 09/10/2017, 7:45 AM

## 2017-09-10 NOTE — Progress Notes (Signed)
Occupational Therapy Session Note  Patient Details  Name: Yvette Collier MRN: [id number] Date of Birth: 1941-03-02  Today's Date: 09/10/2017  Session 1 OT Individual Time: 3500-9381 OT Individual Time Calculation (min): 58 min   Session2 OT Individual Time: 1345-1430 OT Individual Time Calculation (min): 45 min    Short Term Goals: Week 1:  OT Short Term Goal 1 (Week 1): STG=LTG 2/2 ELOS  Skilled Therapeutic Interventions/Progress Updates:   Session 1: Pt resting in bed upon OT arrival and was agreeable to participating in self-care tasks.  Pt ambulated without AD and completed all functional mobility with S for safety.  Mod VCing provided to redirect to task while gathering clothing.  Pt perseverated on underclothes in top drawer and required VCing to retrieve shirt and pants from middle drawer.  Pt completed toileting with VCing to initiate perineal care.  Pt showered self with VCing for safety awareness.  Pt ambulated to bedside chair and dressed self with clothing placed next to her in the chair.  VCing to ambulate to sink and complete oral care and grooming tasks.  Max VCs to locate toothpaste and toothbrush and to utilize properly.  Pt attempted to clean toothbrush with hand soap after finishing oral care.  Of note, increased environmental distractions during that time; in a quiet and calm environment pt did not require as many cues to complete tasks.  Following ADL, attempted to complete scavenger hunt in hallway however pt was unable to locate items (elevators and exit sign) without max A.  Pt unable to read directional signs in hallway and required Max A for navigation to specific places.  Pt made the bed in therapeutic apartment with Min VCs for sequencing.  Pt perseverates on individuals steps and requires prompting to initiate next step. Throughout session, pt presented with word salad and was unaware of errors.  Pt understood ~50% of routine, functional instructions, however less than  20% of novel instructions.  Returned to room and left pt resting in bedside recliner with QRB in place and call light in reach.  All needs met upon OT departure.  Session 2: Pt seated in bedside chair upon OT arrival and was agreeable to participating in therapeutic and cognitive activities to address dynamic standing balance, 1-2 step commands, and R sided awareness to improve functional safety and I with BADLs and IADLs.  Pt ambulated in hallway without AD and SBA for safety.  Pt interacted with therapy dogs and peers in hallway, demonstrating good dynamic standing balance when bending over to pet dogs.  Significantly improved speech noted during interactions as her conversation was more appropriate with fewer word errors noted.  Pt participated in peg board activity, completing simple red-blue row pattern with Min VCing initially for instructions.  Pt able to self-correct one error.  Activity upgraded to include multiple colors with a more complex design, pt required Max A to complete accurately and activity was stopped early.  Pt participated in scavenger hunt in kitchen environment, demonstrating R inattention with difficulty understanding directions for locating specific objects.  Pt assisted back to room and was left resting in bed with 4 siderails up and bed alarm engaged per safety plan.  Call light and all other needs in reach upon OT departure.  Therapy Documentation Precautions:  Precautions Precautions: Fall Precaution Comments: QRB in w/c or recliner, impaired cognition and globally aphasic Restrictions Weight Bearing Restrictions: No Pain: Pain Assessment Pain Assessment: No/denies pain Pain Score: 0-No pain See Function Navigator for Current Functional Status.  Therapy/Group: Individual Therapy  Marcella Dubs 09/10/2017, 2:35 PM

## 2017-09-10 NOTE — Progress Notes (Signed)
Physical Therapy Session Note  Patient Details  Name: LAKIE MCLOUTH MRN: [id number] Date of Birth: Apr 05, 1941  Today's Date: 09/10/2017 PT Individual Time: 3142-7670 PT Individual Time Calculation (min): 69 min   Short Term Goals: No short term goals set  Skilled Therapeutic Interventions/Progress Updates:    again pt indicates ?pain in area immediately posterior to L manibular angle but only with palpation.  Session focus on attention to task, coordination, and activity tolerance.  Pt requires mod cues for problem solving and attention throughout session.  Pt transfers throughout session and ambulates throughout session with supervision.  Toileting with set up assist, hand washing at sink mod I.  Pt completes 11 minutes on Nustep (goal to stop at 10 requiring cues to stop).  Functional UE tasks cleaning table tops.  Bimanual coordination tasks with peg board activity.  Pt returned to room at end of session and positioned in recliner with QRB in place, call bell in reach and needs met.    Therapy Documentation Precautions:  Precautions Precautions: Fall Precaution Comments: QRB in w/c or recliner, impaired cognition and globally aphasic Restrictions Weight Bearing Restrictions: No   See Function Navigator for Current Functional Status.   Therapy/Group: Individual Therapy  Michel Santee 09/10/2017, 4:43 PM

## 2017-09-10 NOTE — Patient Care Conference (Signed)
Inpatient RehabilitationTeam Conference and Plan of Care Update Date: 09/10/2017   Time: 11:20 AM    Patient Name: Yvette Collier      Medical Record Number: [id number]  Date of Birth: 12-Jul-1941 Sex: Female         Room/Bed: 4M01C/4M01C-01 Payor Info: Payor: [employer]vertising copy[employer] MEDICARE / Plan: [employer] MEDICARE / Product Type: *No Product type* /    Admitting Diagnosis: L CVA  Admit Date/Time:  09/04/2017  2:20 PM Admission Comments: No comment available   Primary Diagnosis:  <principal problem not specified> Principal Problem: <principal problem not specified>  Patient Active Problem List   Diagnosis Date Noted  . Gait disturbance, post-stroke 09/04/2017  . Wernicke's aphasia 09/04/2017  . 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 However, needs to rule out CAA, tumor or CVT (vein of labbe)  09/01/2017  . Constipation 01/06/2017  . DJD (degenerative joint disease) of knee 07/18/2016  . GIB (gastrointestinal bleeding) 05/25/2016  . GI bleed 05/24/2016  . Degenerative joint disease (DJD) of hip 05/08/2016  . 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 Hypertensive heart disease   . Hemorrhage of gastrointestinal tract 11/13/2015  . Lumbosacral facet joint syndrome (HCC) 07/26/2015  . Angioedema 07/13/2015  . DM2 (diabetes mellitus, type 2) (HCC) 07/13/2015  . Status post lumbar laminectomy 06/19/2015  . Lumbar radiculopathy 06/19/2015  . DDD (degenerative disc disease), lumbar 05/11/2015  . Facet syndrome, lumbar (HCC) 05/11/2015  . Lumbar post-laminectomy syndrome 05/11/2015  . Sacroiliac joint disease 05/11/2015  . 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 Neuropathy due to secondary diabetes (HCC) 05/11/2015  . Essential hypertension 01/03/2015  . Coronary atherosclerosis of native coronary artery   . PAF (paroxysmal atrial fibrillation) (HCC) 10/20/2014  .  Ischemic colitis (HCC) 10/20/2014  . Diabetes mellitus type 2 with complications (HCC) 10/20/2014  . COPD (chronic obstructive pulmonary disease) (HCC) 10/20/2014  . Hyperlipidemia 10/20/2014  . GERD (gastroesophageal reflux disease) 10/20/2014  . Ingrowing nail, right great toe 04/27/2014  . Internal hemorrhoid, bleeding 07/14/2013    Expected Discharge Date: Expected Discharge Date: 09/12/17  Team Members Present: Physician leading conference: Dr. Claudette Laws Social Worker Present: Staci Acosta, LCSW Nurse Present: Carlean Purl, RN PT Present: Teodoro Kil, PT OT Present: Kearney Hard, OT SLP Present: Feliberto Gottron, SLP PPS Coordinator present : Tora Duck, RN, CRRN     Current Status/Progress Goal Weekly Team Focus  Medical   D2 thin, oral phase issues,    maintain medical stability   d/c planning,   Bowel/Bladder   continent of bowel & bladder, LBM 09/09/17  remain continent  monitor & assist as needed   Swallow/Nutrition/ Hydration   Dys. 2, thin liquids, Full supervision  Supervision  trials of upgraded textures, diet tolernace    ADL's   Supervision/intermittent min A  Supervision  Cognitive retraining, safety awareness, modified bathing/dressing, pt/family education   Mobility   supervision for all mobility  supervision for all mobility 2/2 cognition and communication deficits  d/c planning, family education, activity tolerance, balance   Communication   Max A  Max A  following commands, basic communication    Safety/Cognition/ Behavioral Observations  Max A  Max A  awareness    Pain   no c/o pain on shift, has lyrica scheduled  pain scale <2  continue to assess & treat   Skin   small open area to coccyx  no new areas of skin break down, skin barrier cream applied  assess q shift    Rehab Goals Patient on target to meet rehab goals: Yes Rehab Goals Revised: none *See Care Plan and progress notes for long and short-term goals.     Barriers  to Discharge  Current Status/Progress Possible Resolutions Date Resolved   Physician    Behavior  severe receptive aphasia, as well as apraxia  communication deficits overshadow mobility  family ed      Nursing  Medication compliance;Decreased caregiver support;Lack of/limited family support               PT                    OT                  SLP                SW                Discharge Planning/Teaching Needs:  Pt to return to home with family to provide 24/7 supervision.  family education prior to d/c   Team Discussion:  Pt with urinary retention, but UA is negative.  Pt can make needs known and ST reports she does better with social interaction, but has no awareness and has trouble staying in the room.  Pt is reaching supervision overall with PT and OT, but needs cues due to lack of safety awareness.  Family to be educated about this.  Revisions to Treatment Plan:  none    Continued Need for Acute Rehabilitation Level of Care: The patient requires daily medical management by a physician with specialized training in physical medicine and rehabilitation for the following conditions: Daily direction of a multidisciplinary physical rehabilitation program to ensure safe treatment while eliciting the highest outcome that is of practical value to the patient.: Yes Daily medical management of patient stability for increased activity during participation in an intensive rehabilitation regime.: Yes Daily analysis of laboratory values and/or radiology reports with any subsequent need for medication adjustment of medical intervention for : Neurological problems  Ovadia Lopp, [place] Deck 09/10/2017, 4:18 PM

## 2017-09-11 ENCOUNTER — Inpatient Hospital Stay (HOSPITAL_COMMUNITY): Payer: Medicare Other | Admitting: Speech Pathology

## 2017-09-11 ENCOUNTER — Inpatient Hospital Stay (HOSPITAL_COMMUNITY): Payer: Medicare Other | Admitting: Physical Therapy

## 2017-09-11 ENCOUNTER — Inpatient Hospital Stay (HOSPITAL_COMMUNITY): Payer: Medicare Other | Attending: Physical Medicine & Rehabilitation | Admitting: Occupational Therapy

## 2017-09-11 ENCOUNTER — Inpatient Hospital Stay (HOSPITAL_COMMUNITY): Payer: Medicare Other | Admitting: Occupational Therapy

## 2017-09-11 LAB — GLUCOSE, CAPILLARY
GLUCOSE-CAPILLARY: 102 mg/dL — AB (ref 65–99)
Glucose-Capillary: 205 mg/dL — ABNORMAL HIGH (ref 65–99)
Glucose-Capillary: 108 mg/dL — ABNORMAL HIGH (ref 65–99)
Glucose-Capillary: 106 mg/dL — ABNORMAL HIGH (ref 65–99)

## 2017-09-11 NOTE — Progress Notes (Signed)
Speech Language Pathology Daily Session Note  Patient Details  Name: Yvette Collier MRN: [id number] Date of Birth: 1941/06/08  Today's Date: 09/11/2017 SLP Individual Time: 1000-1100 SLP Individual Time Calculation (min): 60 min  Short Term Goals: Week 1: SLP Short Term Goal 1 (Week 1): STG=LTG due to ELOS   Skilled Therapeutic Interventions: Skilled treatment session focused on communication goals and completion of patient and family education. SLP facilitated session by providing education to the patient's daughter in regards to the patient's current cognitive-linguistic function and strategies to utilize at home to maximize safety, auditory comprehension and verbal expression. Patient's daughter verbalized understanding and demonstrated appropriate cueing. Throughout functional education, patient demonstrated increased verbal expression and participated in a conversation for ~4 turns with 100% intelligibility!  Patient left upright in recliner with all needs within reach and daughter present. Continue with current plan of care.   Function:   Cognition Comprehension Comprehension assist level: Understands basic 25 - 49% of the time/ requires cueing 50 - 75% of the time  Expression   Expression assist level: Expresses basic 25 - 49% of the time/requires cueing 50 - 75% of the time. Uses single words/gestures.  Social Interaction Social Interaction assist level: Interacts appropriately 50 - 74% of the time - May be physically or verbally inappropriate.  Problem Solving Problem solving assist level: Solves basic 25 - 49% of the time - needs direction more than half the time to initiate, plan or complete simple activities  Memory Memory assist level: Recognizes or recalls less than 25% of the time/requires cueing greater than 75% of the time    Pain Pain Assessment Faces Pain Scale: No hurt  Therapy/Group: Individual Therapy  Yvette Collier 09/11/2017, 3:50 PM

## 2017-09-11 NOTE — Progress Notes (Signed)
Physical Therapy Discharge Summary  Patient Details  Name: Yvette Collier MRN: [id number] Date of Birth: 10/01/1941  Today's Date: 09/11/2017 PT Individual Time: 1300-1415 PT Individual Time Calculation (min): 75 min    Patient has met 7 of 7 long term goals due to improved activity tolerance, improved balance, improved postural control and improved attention.  Patient to discharge at an ambulatory level Supervision.   Patient's care partner is independent to provide the necessary cognitive assistance at discharge.  Recommendation:  Patient will benefit from ongoing skilled PT services in outpatient setting to continue to advance safe functional mobility, address ongoing impairments in balance, postural control, and safety awareness, and minimize fall risk.  Equipment: No equipment provided  Reasons for discharge: treatment goals met  Patient/family agrees with progress made and goals achieved: Yes   Skilled PT Intervention:  No c/o pain.  Session focus on d/c assessment, attention, balance, and activity tolerance.  Pt transfers and ambulates throughout session with supervision, occasional unsteadiness on feet when backing or turning, but pt able to correct any LOB without assist.  Pt engaged in functional task focus on attention cleaning horseshoes.  Standing balance for horse shoe game, 3 trials, pt requires supervision, able to collect horse shoes from floor with supervision.  Nustep x8+2 minutes focus on activity tolerance, auditory cue from timer to cease activity with success.  Pt left in dayroom with other therapist for painting activity.    PT Discharge Precautions/Restrictions Precautions Precautions: Fall Precaution Comments: very impulsive, poor safety awareness Pain Pain Assessment Faces Pain Scale: No hurt Vision/Perception  Vision - Assessment Additional Comments: difficult to formally assess, no apparent deficits  Perception Perception: Within Functional  Limits Praxis Praxis: Intact  Cognition Overall Cognitive Status: Impaired/Different from baseline Arousal/Alertness: Awake/alert Orientation Level:  (responds to name, difficult to assess due to communication deficits) Attention: Selective Sustained Attention: Appears intact Selective Attention: Appears intact Memory: Impaired Safety/Judgment: Impaired Sensation Sensation Light Touch: Appears Intact (unable to formally assess 2/2 aphasia) Coordination Gross Motor Movements are Fluid and Coordinated: Yes Fine Motor Movements are Fluid and Coordinated: Yes Motor  Motor Motor: Within Functional Limits  Mobility Bed Mobility Supine to Sit: 6: Modified independent (Device/Increase time) Transfers Transfers: Yes Sit to Stand: 5: Supervision Stand to Sit: 5: Supervision Locomotion  Ambulation Ambulation: Yes Ambulation/Gait Assistance: 5: Supervision Ambulation Distance (Feet): 300 Feet Assistive device: None Stairs / Additional Locomotion Stairs: Yes Stairs Assistance: 5: Supervision Stair Management Technique: One rail Right Number of Stairs: 12 Ramp: 5: Supervision Curb: 5: Supervision Wheelchair Mobility Wheelchair Mobility: No  Trunk/Postural Assessment  Cervical Assessment Cervical Assessment: Within Functional Limits Thoracic Assessment Thoracic Assessment: Within Functional Limits Lumbar Assessment Lumbar Assessment: Within Functional Limits Postural Control Postural Control: Within Functional Limits  Balance Balance Balance Assessed: Yes Static Standing Balance Static Standing - Balance Support: During functional activity Static Standing - Level of Assistance: 5: Stand by assistance Dynamic Standing Balance Dynamic Standing - Balance Support: During functional activity Dynamic Standing - Level of Assistance: 5: Stand by assistance Extremity Assessment      RLE Assessment RLE Assessment: Within Functional Limits RLE Strength Right Hip Flexion:  5/5 Right Knee Flexion: 5/5 Right Knee Extension: 5/5 Right Ankle Dorsiflexion: 5/5 Right Ankle Plantar Flexion: 5/5 LLE Assessment LLE Assessment: Within Functional Limits LLE Strength Left Hip Flexion: 5/5 Left Knee Flexion: 5/5 Left Knee Extension: 5/5 Left Ankle Dorsiflexion: 5/5 Left Ankle Plantar Flexion: 5/5   See Function Navigator for Current Functional Status.  Michel Santee  09/11/2017, 2:32 PM

## 2017-09-11 NOTE — Progress Notes (Signed)
Occupational Therapy Discharge Summary  Patient Details  Name: Yvette Collier MRN: [id number] Date of Birth: [birth year]  Today's Date: 09/11/2017 OT Individual Time: 0902-1000 OT Individual Time Calculation (min): 58 min   OT treatment session focused on safety awareness and increased independence within bathing/dressing tasks. Pt able to obtain clothing ambulating in room without AD and supervision. Pt with no LOB throughout session. Pt ambulated into bathroom, voided bladder in commode and completed 3/3 toileting steps without assist. Bathing completed shower level with improved safety awareness to sit to wash LB. With minimal distractions today, pt able to locate 4/4 grooming items at the sink with min questioning cues. Pt then ambulated to the tub room to practice tub shower transfers. Pt communicated to therapist that she had "one of  Those" pointing to tub bench. OT asked pt to demonstrate how she got in and out of shower at home and pt was able to safely demonstrate tub shower transfer using tub bench without verbal cues.  Pt returned to room and was left seated in recliner with safety belt on and needs met.    Patient has met 12 of 12 long term goals due to improved activity tolerance, improved balance, postural control, ability to compensate for deficits, improved attention and improved coordination.  Patient to discharge at overall Supervision level.  Patient's care partner is independent to provide the necessary cognitive assistance at discharge.    Reasons goals not met: n/a  Recommendation:  Patient will benefit from ongoing skilled OT services in home health setting to continue to advance functional skills in the area of BADL.  Equipment: No equipment provided  Reasons for discharge: treatment goals met and discharge from hospital  Patient/family agrees with progress made and goals achieved: Yes  OT Discharge Precautions/Restrictions  Precautions Precautions: Fall Precaution  Comments:  impulsive, poor safety awareness Restrictions Weight Bearing Restrictions: No  Pain Pain Assessment Faces Pain Scale: No hurt ADL ADL Eating: Supervision/safety Grooming: Supervision/safety Upper Body Bathing: Supervision/safety Lower Body Bathing: Supervision/safety Upper Body Dressing: Independent Lower Body Dressing: Modified independent Toileting: Supervision/safety Toilet Transfer: Distant supervision Tub/Shower Transfer: Distant supervision ADL Comments: Please see functional navigator Vision Baseline Vision/History:  (unable to assess) Vision Assessment?: No apparent visual deficits Additional Comments: difficult to formally assess, no apparent deficits  Perception  Perception: Within Functional Limits Praxis Praxis: Intact Cognition Overall Cognitive Status: Impaired/Different from baseline Arousal/Alertness: Awake/alert Orientation Level:  (responds to name, difficult to assess due to communication deficits) Attention: Selective Sustained Attention: Appears intact Selective Attention: Appears intact Memory: Impaired Safety/Judgment: Impaired Sensation Sensation Light Touch: Appears Intact (unable to formally assess 2/2 aphasia) Coordination Gross Motor Movements are Fluid and Coordinated: Yes Fine Motor Movements are Fluid and Coordinated: Yes Motor  Motor Motor: Within Functional Limits Mobility  Bed Mobility Bed Mobility: Supine to Sit Supine to Sit: 6: Modified independent (Device/Increase time) Transfers Sit to Stand: 5: Supervision Stand to Sit: 5: Supervision  Trunk/Postural Assessment  Cervical Assessment Cervical Assessment: Within Functional Limits Thoracic Assessment Thoracic Assessment: Within Functional Limits Lumbar Assessment Lumbar Assessment: Within Functional Limits Postural Control Postural Control: Within Functional Limits  Balance Balance Balance Assessed: Yes Static Standing Balance Static Standing - Balance  Support: During functional activity Static Standing - Level of Assistance: 6: Modified independent (Device/Increase time) Dynamic Standing Balance Dynamic Standing - Balance Support: During functional activity Dynamic Standing - Level of Assistance: 5: Stand by assistance Extremity/Trunk Assessment RUE Assessment RUE Assessment: Within Functional Limits LUE Assessment LUE Assessment: Within Functional Limits  See Function Navigator for Current Functional Status.  Daneen Schick [name] 09/11/2017, 3:47 PM

## 2017-09-11 NOTE — Discharge Summary (Signed)
Discharge summary job [id number]

## 2017-09-11 NOTE — Discharge Summary (Signed)
Yvette Collier, Yvette Collier NO.:  [id number]  MEDICAL RECORD NO.:  [id number]  LOCATION:  4M01C                        FACILITY:  [facility]  PHYSICIAN:  Erick Colace, M.D.DATE OF BIRTH:  1941-07-21  DATE OF ADMISSION:  09/04/2017 DATE OF DISCHARGE:  09/12/2017                              DISCHARGE SUMMARY   DISCHARGE DIAGNOSES: 1. 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 2. SCDs for DVT prophylaxis. 3. Chronic low back pain. 4. Hypertension. 5. Diabetes mellitus peripheral neuropathy. 6. Acute kidney injury, resolved. 7. Hyperlipidemia. 8. History of gastrointestinal bleed.  HISTORY OF PRESENT ILLNESS:  This is a 76 year old right-handed female with history of hypertension, PAF, on no anticoagulation except baby aspirin due to history of GI bleed, diabetes mellitus.  Lives independent prior to admission, working as a Education officer, environmental.  Admitted September 01, 2017, with altered mental status, right-sided weakness, and aphasia.  Blood pressure elevated, systolic 175, started on nicardipine drip.  CT of the head showed large acute left temporal lobe hematoma, suspect hypertensive hemorrhage.  CT cervical spine negative.  Neurology consulted, advised conservative care.  Echocardiogram with ejection fraction of 65%, grade 1 diastolic dysfunction.  Followup CT of the head, stable.  No acute abnormalities. Mild elevation in creatinine 1.19 from baseline 0.17 and monitored. Physical and occupational therapy ongoing.  The patient was admitted for comprehensive rehab program.  PAST MEDICAL HISTORY:  See discharge diagnoses.  SOCIAL HISTORY:  Lives alone, independent prior to admission.  FUNCTIONAL STATUS UPON ADMISSION TO REHAB SERVICES:  Minimal assist 5 feet, 2-person handheld assist; moderate assist stand, pivot, transfers; min-to-mod assist activities of daily living.  PHYSICAL EXAMINATION:  VITAL SIGNS:  Blood pressure 110/68, pulse  62, temperature 98, respirations 14. GENERAL:  This was an alert female, makes good eye contact with examiner.  Multiple paraphasic errors, receptive and expressive language deficits.  EOMs intact. NECK:  Supple.  Nontender.  No JVD. CARDIAC:  Rate controlled. ABDOMEN:  Soft, nontender.  Good bowel sounds. LUNGS:  Clear to auscultation without wheeze.  REHABILITATION HOSPITAL COURSE:  Patient was admitted to Inpatient Rehab Services.  Therapies initiated on a 3-hour daily basis, consisting of physical therapy, occupational therapy, speech therapy, and rehabilitation nursing.  The following issues were addressed during the patient's rehabilitation stay. 1. Pertaining to Mrs. Citro's left temporal lobe hemorrhage, remained     stable.  Close monitoring of blood pressure.  On Lopressor and     Cozaar. She had been on baby aspirin prior to admission held due to [facility] and follow-up with neurology as well as cardiology services. She would follow up with her primary MD as well as     Neurology Services. 2. SCDs for DVT prophylaxis.  No anticoagulation due to hemorrhage. 3. Chronic back pain, maintained on Lyrica, with good results. 4. Blood pressure overall controlled, and again monitored.  She did     have a documented history of PAF, patient had been on a baby aspirin prior to admission noted history of GI bleed. Aspirin held due to ICH until follow-up with neurology and cardiology services. 5. Blood sugars controlled.  Hemoglobin A1c of 6.8.  Diet controlled  prior to admission. 6. Acute kidney injury.  Creatinine stabilized.  The patient received weekly collaborative interdisciplinary team conferences to discuss estimated length of stay, family teaching, any barriers to discharge.  Requires some cues for problem solving and attention.  Ambulates extended distances with assistive device. Gathered belongings for activities of daily living and homemaking. Again, needing some cues.   Ambulates to the bedside chair, dresses herself, clothing.  It was discussed no driving, and assistance at time of discharge arranged.  She was discharged to home.  DISCHARGE MEDICATIONS:  Included: 1. Cozaar 25 mg p.o. daily. 2. Lopressor 12.5 mg p.o. b.i.d. 3. Protonix 40 mg p.o. daily. 4. Lyrica 25 mg p.o. b.i.d. 5. Senokot-S 2 tablets at bedtime. 6. Zocor 10 mg p.o. daily.  DIET:  Dysphagia #2, thin liquid diet.  FOLLOWUP:  She would [facility]; Dr. Roda Shutters, Neurology Service, call for appointment; Dr. Fulton Mole, Medical Management, Dr. Mariah Milling cardiology services.  SPECIAL INSTRUCTIONS:  No driving.  Hold aspirin until follow-up with cardiology and neurology services.     Mariam Dollar, P.A.   ______________________________ Erick Colace, M.D.    DA/MEDQ  D:  09/11/2017  T:  09/11/2017  Job:  [id number]  cc:   Erick Colace, M.D. Harriett Pietro Cassis, MD Marvel Plan, MD

## 2017-09-11 NOTE — Progress Notes (Signed)
Subjective/Complaints:  No issues overnite   ROS- cannot obtain remains aphasic  Objective: Vital Signs: Blood pressure 122/70, pulse 82, temperature 98.6 F (37 C), temperature source Oral, resp. rate 18, height  (1.676 m), weight 70.3 kg (155 lb), SpO2 100 %. No results found. Results for orders placed or performed during the hospital encounter of 09/04/17 (from the past 72 hour(s))  Glucose, capillary     Status: Abnormal   Collection Time: 09/08/17 11:25 AM  Result Value Ref Range   Glucose-Capillary 186 (H) 65 - 99 mg/dL  Culture, Urine     Status: Abnormal   Collection Time: 09/08/17  3:39 PM  Result Value Ref Range   Specimen Description URINE, CATHETERIZED    Special Requests NONE    Culture (A)     >=100,000 COLONIES/mL LACTOBACILLUS SPECIES Standardized susceptibility testing for this organism is not available.    Report Status 09/10/2017 FINAL   Glucose, capillary     Status: Abnormal   Collection Time: 09/08/17  4:42 PM  Result Value Ref Range   Glucose-Capillary 141 (H) 65 - 99 mg/dL  Glucose, capillary     Status: Abnormal   Collection Time: 09/08/17  8:30 PM  Result Value Ref Range   Glucose-Capillary 152 (H) 65 - 99 mg/dL  Glucose, capillary     Status: Abnormal   Collection Time: 09/09/17  7:12 AM  Result Value Ref Range   Glucose-Capillary 120 (H) 65 - 99 mg/dL  Glucose, capillary     Status: Abnormal   Collection Time: 09/09/17 11:40 AM  Result Value Ref Range   Glucose-Capillary 155 (H) 65 - 99 mg/dL  Glucose, capillary     Status: Abnormal   Collection Time: 09/09/17  4:25 PM  Result Value Ref Range   Glucose-Capillary 175 (H) 65 - 99 mg/dL  Glucose, capillary     Status: Abnormal   Collection Time: 09/09/17  9:04 PM  Result Value Ref Range   Glucose-Capillary 168 (H) 65 - 99 mg/dL  Glucose, capillary     Status: Abnormal   Collection Time: 09/10/17  6:31 AM  Result Value Ref Range   Glucose-Capillary 108 (H) 65 - 99 mg/dL  Glucose,  capillary     Status: Abnormal   Collection Time: 09/10/17 11:46 AM  Result Value Ref Range   Glucose-Capillary 201 (H) 65 - 99 mg/dL  Glucose, capillary     Status: Abnormal   Collection Time: 09/10/17  4:39 PM  Result Value Ref Range   Glucose-Capillary 109 (H) 65 - 99 mg/dL  Glucose, capillary     Status: Abnormal   Collection Time: 09/10/17  9:02 PM  Result Value Ref Range   Glucose-Capillary 110 (H) 65 - 99 mg/dL  Glucose, capillary     Status: Abnormal   Collection Time: 09/11/17  6:39 AM  Result Value Ref Range   Glucose-Capillary 102 (H) 65 - 99 mg/dL     HEENT: Left  Cervical node enlarged and tender  Cardio: RRR and no murmur Resp: CTA B/L and unlabored GI: BS positive and NT, ND Extremity:  No Edema Skin:   Intact Neuro: Confused, Normal Motor and Aphasic, cannot state her name, some automatic responses , is occ able to repeat, , + neologisms without self correction dificulty with MMT but does move all 4 ext against resistance Musc/Skel:  Other no pain with UE ROM Gen NAD   Assessment/Plan: 1. Functional deficits secondary to left temporal lobe hemorrhage which require 3+ hours per day  of interdisciplinary therapy in a comprehensive inpatient rehab setting. Physiatrist is providing close team supervision and 24 hour management of active medical problems listed below. Physiatrist and rehab team continue to assess barriers to discharge/monitor patient progress toward functional and medical goals. FIM: Function - Bathing Position: Shower Body parts bathed by patient: Right arm, Left arm, Chest, Abdomen, Front perineal area, Buttocks, Right upper leg, Left upper leg, Right lower leg, Left lower leg, Back Body parts bathed by helper: Back Assist Level: Supervision or verbal cues  Function- Upper Body Dressing/Undressing What is the patient wearing?: Bra, Pull over shirt/dress Bra - Perfomed by patient: Thread/unthread right bra strap, Thread/unthread left bra strap,  Hook/unhook bra (pull down sports bra) Bra - Perfomed by helper: Hook/unhook bra (pull down sports bra) Pull over shirt/dress - Perfomed by patient: Thread/unthread right sleeve, Thread/unthread left sleeve, Put head through opening, Pull shirt over trunk Assist Level: Supervision or verbal cues Function - Lower Body Dressing/Undressing What is the patient wearing?: Underwear, Pants, Socks, Shoes Position:  (Bedside chair) Underwear - Performed by patient: Thread/unthread right underwear leg, Thread/unthread left underwear leg, Pull underwear up/down Pants- Performed by patient: Thread/unthread right pants leg, Thread/unthread left pants leg, Pull pants up/down, Fasten/unfasten pants Non-skid slipper socks- Performed by patient: Don/doff right sock, Don/doff left sock Socks - Performed by patient: Don/doff right sock, Don/doff left sock Shoes - Performed by patient: Don/doff right shoe, Don/doff left shoe, Fasten right, Fasten left Shoes - Performed by helper: Fasten right, Fasten left Assist for footwear: Supervision/touching assist Assist for lower body dressing: Supervision or verbal cues  Function - Toileting Toileting steps completed by patient: Adjust clothing prior to toileting, Performs perineal hygiene, Adjust clothing after toileting Assist level: Supervision or verbal cues  Function - Toilet Transfers Toilet transfer assistive device: Grab bar Assist level to toilet: Touching or steadying assistance (Pt > 75%) Assist level from toilet: Touching or steadying assistance (Pt > 75%)  Function - Chair/bed transfer Chair/bed transfer method: Ambulatory Chair/bed transfer assist level: Supervision or verbal cues Chair/bed transfer assistive device: Armrests, Walker Chair/bed transfer details: Visual cues for safe use of DME/AE, Visual cues/gestures for precautions/safety  Function - Locomotion: Wheelchair Will patient use wheelchair at discharge?: No Function - Locomotion:  Ambulation Assistive device: Walker-rolling Max distance: 120 Assist level: Touching or steadying assistance (Pt > 75%) Assist level: Touching or steadying assistance (Pt > 75%) Assist level: Touching or steadying assistance (Pt > 75%) Assist level: Touching or steadying assistance (Pt > 75%) Assist level: Touching or steadying assistance (Pt > 75%)  Function - Comprehension Comprehension: Auditory Comprehension assist level: Understands basic less than 25% of the time/ requires cueing >75% of the time  Function - Expression Expression: Verbal Expression assist level: Expresses basis less than 25% of the time/requires cueing >75% of the time.  Function - Social Interaction Social Interaction assist level: Interacts appropriately 25 - 49% of time - Needs frequent redirection.  Function - Problem Solving Problem solving assist level: Solves basic less than 25% of the time - needs direction nearly all the time or does not effectively solve problems and may need a restraint for safety  Function - Memory Memory assist level: Recognizes or recalls less than 25% of the time/requires cueing greater than 75% of the time Patient normally able to recall (first 3 days only): None of the above  Medical Problem List and Plan: 1. Right hemiparesis and receptive expressive language deficitssecondary to left temporal lobe hemorrhage secondary to hypertensive crisis CIR PT,  OT, SLP- plan D/C in am  2. DVT Prophylaxis/Anticoagulation: SCDs- no anticoagulants due to bleed 3. Pain Management/chronic low back pain: Lyrica 25 mg twice a day 4. Mood: Provide emotional support 5. Neuropsych: This patient iscapable of making decisions on herown behalf. 6. Skin/Wound Care: Routine skin checks 7. Fluids/Electrolytes/Nutrition: Routine I&O with follow-up chemistries 8.Hypertension/PAF. Cozaar 25 mg daily, Lopressor 12.5 mg twice a day.Followed by Dr.Gollan.Monitor with increased mobility.Cardiac rate  control Vitals:   09/10/17 1340 09/11/17 0524  BP: 106/62 122/70  Pulse: 76 82  Resp: 18 18  Temp: 99.1 F (37.3 C) 98.6 F (37 C)  SpO2: 100% 100%  BP controlled 9/20 9.Diabetes mellitus with peripheral neuropathy. Hemoglobin A1c 6.8. SSI.Check blood sugars before meals and at bedtime Diet controlled PTA, monitor 10.AKI.Creatinine mildly elevated at 1.19. Follow-up chemistries. Encourage fluids 10.Hyperlipidemia. Zocor 11. History of GI bleed. Continue Protonix , Hgb 10.6, stable vs 9/13 12.  Urinary retention last cath 9/17 UA - 13.  Left cervical adenopathy, had low grad temp, last CBC 9/14 showed normal WBCs, suspect viral URI, pt unable to give hx of sore throat  1 LOS (Days) 7 A FACE TO FACE EVALUATION WAS PERFORMED  Masiyah Jorstad E 09/11/2017, 7:12 AM

## 2017-09-12 LAB — GLUCOSE, CAPILLARY
Glucose-Capillary: 132 mg/dL — ABNORMAL HIGH (ref 65–99)
Glucose-Capillary: 204 mg/dL — ABNORMAL HIGH (ref 65–99)

## 2017-09-12 MED ORDER — METOPROLOL TARTRATE 25 MG PO TABS: 12.5000 mg | ORAL_TABLET | Freq: Two times a day (BID) | ORAL | 0 refills | Status: AC

## 2017-09-12 MED ORDER — PREGABALIN 25 MG PO CAPS: ORAL_CAPSULE | ORAL | 0 refills | Status: AC

## 2017-09-12 MED ORDER — LOSARTAN POTASSIUM 25 MG PO TABS: 25.0000 mg | ORAL_TABLET | Freq: Every day | ORAL | 0 refills | Status: DC

## 2017-09-12 MED ORDER — SIMVASTATIN 10 MG PO TABS: 10.0000 mg | ORAL_TABLET | Freq: Every evening | ORAL | 0 refills | Status: AC

## 2017-09-12 NOTE — Progress Notes (Signed)
Subjective/Complaints:  Left sided neck pain.  Speech clearing.  States she had this problem before, saw a Dr who s"sent it off" to have other MDs to look at it (?biopsy).  "Wasn't serious" I reviewed CT of C spine report , some degenerative changes no severe spinal stenosis.       Objective: Vital Signs: Blood pressure (!) 103/58, pulse 64, temperature 97.7 F (36.5 C), temperature source Oral, resp. rate 18, height  (1.676 m), weight 70.3 kg (155 lb), SpO2 100 %. No results found. Results for orders placed or performed during the hospital encounter of 09/04/17 (from the past 72 hour(s))  Glucose, capillary     Status: Abnormal   Collection Time: 09/09/17  7:12 AM  Result Value Ref Range   Glucose-Capillary 120 (H) 65 - 99 mg/dL  Glucose, capillary     Status: Abnormal   Collection Time: 09/09/17 11:40 AM  Result Value Ref Range   Glucose-Capillary 155 (H) 65 - 99 mg/dL  Glucose, capillary     Status: Abnormal   Collection Time: 09/09/17  4:25 PM  Result Value Ref Range   Glucose-Capillary 175 (H) 65 - 99 mg/dL  Glucose, capillary     Status: Abnormal   Collection Time: 09/09/17  9:04 PM  Result Value Ref Range   Glucose-Capillary 168 (H) 65 - 99 mg/dL  Glucose, capillary     Status: Abnormal   Collection Time: 09/10/17  6:31 AM  Result Value Ref Range   Glucose-Capillary 108 (H) 65 - 99 mg/dL  Glucose, capillary     Status: Abnormal   Collection Time: 09/10/17 11:46 AM  Result Value Ref Range   Glucose-Capillary 201 (H) 65 - 99 mg/dL  Glucose, capillary     Status: Abnormal   Collection Time: 09/10/17  4:39 PM  Result Value Ref Range   Glucose-Capillary 109 (H) 65 - 99 mg/dL  Glucose, capillary     Status: Abnormal   Collection Time: 09/10/17  9:02 PM  Result Value Ref Range   Glucose-Capillary 110 (H) 65 - 99 mg/dL  Glucose, capillary     Status: Abnormal   Collection Time: 09/11/17  6:39 AM  Result Value Ref Range   Glucose-Capillary 102 (H) 65 - 99 mg/dL   Glucose, capillary     Status: Abnormal   Collection Time: 09/11/17 11:27 AM  Result Value Ref Range   Glucose-Capillary 205 (H) 65 - 99 mg/dL   Comment 1 Notify RN   Glucose, capillary     Status: Abnormal   Collection Time: 09/11/17  4:49 PM  Result Value Ref Range   Glucose-Capillary 108 (H) 65 - 99 mg/dL   Comment 1 Notify RN   Glucose, capillary     Status: Abnormal   Collection Time: 09/11/17  8:57 PM  Result Value Ref Range   Glucose-Capillary 106 (H) 65 - 99 mg/dL  Glucose, capillary     Status: Abnormal   Collection Time: 09/12/17  6:15 AM  Result Value Ref Range   Glucose-Capillary 132 (H) 65 - 99 mg/dL     HEENT: Left  Cervical node ant to  SCM enlarged and tender , no other cervical adenopathy Cardio: RRR and no murmur Resp: CTA B/L and unlabored GI: BS positive and NT, ND Extremity:  No Edema Skin:   Intact Neuro: Confused, Normal Motor and Aphasic, apraxic dificulty with MMT but does move all 4 ext against resistance Musc/Skel:  Other no pain with UE ROM Gen NAD   Assessment/Plan:  1. Functional deficits secondary to left temporal lobe hemorrhage  Stable for D/C today F/u PCP in 3-4 weeks, f/u on left cervical  lymph node F/u PM&R 2 weeks See D/C summary See D/C instructions FIM: Function - Bathing Position: Shower Body parts bathed by patient: Right arm, Left arm, Chest, Abdomen, Front perineal area, Buttocks, Right upper leg, Left upper leg, Right lower leg, Left lower leg, Back Body parts bathed by helper: Back Assist Level: Supervision or verbal cues  Function- Upper Body Dressing/Undressing What is the patient wearing?: Bra, Pull over shirt/dress Bra - Perfomed by patient: Thread/unthread right bra strap, Thread/unthread left bra strap, Hook/unhook bra (pull down sports bra) Bra - Perfomed by helper: Hook/unhook bra (pull down sports bra) Pull over shirt/dress - Perfomed by patient: Thread/unthread left sleeve, Put head through opening, Pull shirt  over trunk, Thread/unthread right sleeve Assist Level: Supervision or verbal cues Function - Lower Body Dressing/Undressing What is the patient wearing?: Underwear, Pants, Socks, Shoes Position: Sitting EOB Underwear - Performed by patient: Thread/unthread right underwear leg, Thread/unthread left underwear leg, Pull underwear up/down Pants- Performed by patient: Thread/unthread right pants leg, Thread/unthread left pants leg, Pull pants up/down Non-skid slipper socks- Performed by patient: Don/doff right sock, Don/doff left sock Socks - Performed by patient: Don/doff right sock, Don/doff left sock Shoes - Performed by patient: Don/doff right shoe, Don/doff left shoe, Fasten right, Fasten left Shoes - Performed by helper: Fasten right, Fasten left Assist for footwear: Independent Assist for lower body dressing: No Help, No cues  Function - Toileting Toileting activity did not occur: Safety/medical concerns Toileting steps completed by patient: Adjust clothing prior to toileting, Adjust clothing after toileting Toileting steps completed by helper: Performs perineal hygiene Toileting Assistive Devices: Grab bar or rail Assist level: Touching or steadying assistance (Pt.75%)  Function - Archivist transfer activity did not occur: Safety/medical concerns Toilet transfer assistive device: Walker Assist level to toilet: Touching or steadying assistance (Pt > 75%) Assist level from toilet: No Help, no cues, assistive device, takes more than a reasonable amount of time  Function - Chair/bed transfer Chair/bed transfer method: Ambulatory Chair/bed transfer assist level: Supervision or verbal cues Chair/bed transfer assistive device: Armrests, Walker Chair/bed transfer details: Visual cues for safe use of DME/AE, Visual cues/gestures for precautions/safety  Function - Locomotion: Wheelchair Will patient use wheelchair at discharge?: No Function - Locomotion:  Ambulation Ambulation activity did not occur: Safety/medical concerns Assistive device: No device Max distance: 300 Assist level: Supervision or verbal cues Assist level: Supervision or verbal cues Assist level: Supervision or verbal cues Assist level: Supervision or verbal cues Assist level: Supervision or verbal cues  Function - Comprehension Comprehension: Auditory Comprehension assist level: Understands basic 75 - 89% of the time/ requires cueing 10 - 24% of the time  Function - Expression Expression: Verbal Expression assist level: Expresses complex ideas: With extra time/assistive device  Function - Social Interaction Social Interaction assist level: Interacts appropriately 50 - 74% of the time - May be physically or verbally inappropriate.  Function - Problem Solving Problem solving assist level: Solves basic 25 - 49% of the time - needs direction more than half the time to initiate, plan or complete simple activities  Function - Memory Memory assist level: Recognizes or recalls less than 25% of the time/requires cueing greater than 75% of the time Patient normally able to recall (first 3 days only): None of the above  Medical Problem List and Plan: 1. Right hemiparesis and receptive expressive language  deficitssecondary to left temporal lobe hemorrhage secondary to hypertensive crisis CIR PT, OT, SLP- plan D/C today  2. DVT Prophylaxis/Anticoagulation: SCDs- no anticoagulants due to bleed 3. Pain Management/chronic low back pain: Lyrica 25 mg twice a day 4. Mood: Provide emotional support 5. Neuropsych: This patient iscapable of making decisions on herown behalf. 6. Skin/Wound Care: Routine skin checks 7. Fluids/Electrolytes/Nutrition: Routine I&O with follow-up chemistries 8.Hypertension/PAF. Cozaar 25 mg daily, Lopressor 12.5 mg twice a day.Followed by Dr.Gollan.Monitor with increased mobility.Cardiac rate control Vitals:   09/11/17 1513 09/12/17 0500  BP:  (!) 87/61 (!) 103/58  Pulse: 99 64  Resp: 17 18  Temp: 98.4 F (36.9 C) 97.7 F (36.5 C)  SpO2: 98% 100%  BP controlled 9/21 9.Diabetes mellitus with peripheral neuropathy. Hemoglobin A1c 6.8. SSI.Check blood sugars before meals and at bedtime Diet controlled PTA, monitor 10.AKI.Creatinine mildly elevated at 1.19. Follow-up chemistries. Encourage fluids 10.Hyperlipidemia. Zocor 11. History of GI bleed. Continue Protonix , Hgb 10.6, stable vs 9/13 12.  Urinary retention last cath 9/17 UA - 13.  Left cervical adenopathy,sounds chronic and may have been biopsied, f/u PCP 1 LOS (Days) 8 A FACE TO FACE EVALUATION WAS PERFORMED  Johncarlos Holtsclaw E 09/12/2017, 6:35 AM

## 2017-09-12 NOTE — Progress Notes (Signed)
Speech Language Pathology Discharge Summary  Patient Details  Name: Yvette Collier MRN: [id number] Date of Birth: 05/21/1941   Patient has met 5 of 6 long term goals.  Patient to discharge at overall Max level.   Reasons goals not met: Patient continues to require Dys. 2 textures for swallowing safety    Clinical Impression/Discharge Summary: Patient has made functional gains and has met 5 of 6 LTG's this reporting period. Currently, patient is consuming Dys. 2 textures with thin liquids with intermittent supervision. Patient continues to require more than a reasonable amount of time for PO intake and intermittent verbal cues for a slow rate and to clear oral residue/pocketing. Patient also continues to demonstrate a severe Wernicke's aphasia characterized by fluent jargon, neologisms, phonemic paraphasias, and minimal awareness of errors. Patient requires overall Max A multimodal cues for auditory comprehension and verbal expression but is able to make her needs known. However, patient's communication improves during functional tasks and within specific contexts. Patient and family education is complete and patient will discharge home with 24 hour supervision from family. Patient would benefit from f/u SLP services to maximize her functional communication, cognitive functioning and swallowing function in order to maximize her overall functional independence and reduce caregiver burden.   Care Partner:  Caregiver Able to Provide Assistance: Yes  Type of Caregiver Assistance: Cognitive  Recommendation:  Outpatient SLP;24 hour supervision/assistance  Rationale for SLP Follow Up: Reduce caregiver burden;Maximize cognitive function and independence;Maximize functional communication;Maximize swallowing safety   Equipment: N/A   Reasons for discharge: Treatment goals met;Discharged from hospital   Patient/Family Agrees with Progress Made and Goals Achieved: Yes       [place], [place] 09/12/2017,  6:58 AM

## 2017-09-12 NOTE — Discharge Instructions (Signed)
Inpatient Rehab Discharge Instructions  Yvette Collier Discharge date and time: No discharge date for patient encounter.   Activities/Precautions/ Functional Status: Activity: activity as tolerated Diet: Dysphagia 2 thin liquids Wound Care: none needed Functional status:  ___ No restrictions     ___ Walk up steps independently ___ 24/7 supervision/assistance   ___ Walk up steps with assistance ___ Intermittent supervision/assistance  ___ Bathe/dress independently ___ Walk with walker     _x__ Bathe/dress with assistance ___ Walk Independently    ___ Shower independently ___ Walk with assistance    ___ Shower with assistance ___ No alcohol     ___ Return to work/school ________  COMMUNITY REFERRALS UPON DISCHARGE:   Outpatient: PT     OT     ST  Agency:  [facility]                            [address]                            [place], [place]  [address]  Phone:   [phone]  Appointment Date/Time:  Monday, September 24 8:45 AM - Occupational Therapy; 10 AM - Speech Therapy; 11 AM - Physical Therapy Medical Equipment/Items Ordered:  The therapists did not recommend any medical equipment.   GENERAL COMMUNITY RESOURCES FOR PATIENT/FAMILY: Support Groups:  [place] [facility] Support Group                              Meets the 3rd Tuesday of each month (March - Novemeber) at 12:15 - 1:30 PM                              [facility] [address], [employer]                              For more information or to register, call [phone]  Special Instructions: No driving   Hold aspirin until follow-up with cardiology and neurology services.   STROKE/TIA DISCHARGE INSTRUCTIONS SMOKING Cigarette smoking nearly doubles your risk of having a stroke & is the single most alterable risk factor  If you smoke or have smoked in the last 12 months, you are advised to quit smoking  for your health.  Most of the excess cardiovascular risk related to smoking disappears within a year of stopping.  Ask you doctor about anti-smoking medications  [place] Quit Line: 1-800-QUIT NOW  Free Smoking Cessation Classes [phone]  CHOLESTEROL Know your levels; limit fat & cholesterol in your diet  Lipid Panel  No results found for: CHOL, TRIG, HDL, CHOLHDL, VLDL, LDLCALC    Many patients benefit from treatment even if their cholesterol is at goal.  Goal: Total Cholesterol (CHOL) less than 160  Goal:  Triglycerides (TRIG) less than 150  Goal:  HDL greater than 40  Goal:  LDL (LDLCALC) less than 100   BLOOD PRESSURE American Stroke Association blood pressure target is less that 120/80 mm/Hg  Your discharge blood pressure is:  BP:  128/67  Monitor your blood pressure  Limit your salt and alcohol intake  Many individuals will require more than one medication for high blood pressure  DIABETES (A1c is a blood sugar average for last 3 months) Goal HGBA1c is under 7% (HBGA1c is blood sugar average for last 3 months)  Diabetes:   Lab Results  Component Value Date   HGBA1C 6.8 (H) 09/02/2017     Your HGBA1c can be lowered with medications, healthy diet, and exercise.  Check your blood sugar as directed by your physician  Call your physician if you experience unexplained or low blood sugars.  PHYSICAL ACTIVITY/REHABILITATION Goal is 30 minutes at least 4 days per week  Activity: Increase activity slowly, Therapies: Physical Therapy: Home Health Return to work:   Activity decreases your risk of heart attack and stroke and makes your heart stronger.  It helps control your weight and blood pressure; helps you relax and can improve your mood.  Participate in a regular exercise program.  Talk with your doctor about the best form of exercise for you (dancing, walking, swimming, cycling).  DIET/WEIGHT Goal is to maintain a healthy weight  Your discharge diet is: DIET DYS 2  Room service appropriate? Yes; Fluid consistency: Thin  liquids Your height is:  Height:  (167.6 cm) Your current weight is: Weight: 70.3 kg (155 lb) Your Body Mass Index (BMI) is:  BMI (Calculated): 25.03  Following the type of diet specifically designed for you will help prevent another stroke.  Your goal weight range is:    Your goal Body Mass Index (BMI) is 19-24.  Healthy food habits can help reduce 3 risk factors for stroke:  High cholesterol, hypertension, and excess weight.  RESOURCES Stroke/Support Group:  Call [phone]   STROKE EDUCATION PROVIDED/REVIEWED AND GIVEN TO PATIENT Stroke warning signs and symptoms How to activate emergency medical system (call 911). Medications prescribed at discharge. Need for follow-up after discharge. Personal risk factors for stroke. Pneumonia vaccine given:  Flu vaccine given:  My questions have been answered, the writing is legible, and I understand these instructions.  I will adhere to these goals & educational materials that have been provided to me after my discharge from the hospital.      My questions have been answered and I understand these instructions. I will adhere to these goals and the provided educational materials after my discharge from the hospital.  Patient/Caregiver Signature _______________________________ Date __________  Clinician Signature _______________________________________ Date __________  Please bring this form and your medication list with you to all your follow-up doctor's appointments.

## 2017-09-12 NOTE — Progress Notes (Signed)
Social Work Patient ID: Yvette Collier, female   DOB: 1941/08/22, 76 y.o.   MRN: [id number]   CSW met with pt and talked with pt's two dtrs to update them on team conference discussion and pt's targeted d/c date of 09-12-17.  CSW also asked Devona to come in for family education with speech and that occurred 09-11-17.  Therapists feel dtr is prepared to care for pt at home and family will take "shifts" being with pt at her home.  It is recommended that pt go for outpt rehab due to her higher level needs, especially pertaining to speech.  Pt apparently had [place] prior to admission, but family would like to follow medical and therapy team recommendations for outpt rehab.  CSW has arranged this for pt and no DME is recommended.  CSW will remain available, as needed.

## 2017-09-12 NOTE — Progress Notes (Signed)
Social Work Discharge Note  The overall goal for the admission was met for:   Discharge location: Yes - pt's home with family taking turns being with her 24/7  Length of Stay: Yes - 8 days  Discharge activity level: Yes - supevision  Home/community participation: Yes  Services provided included: MD, RD, PT, OT, SLP, RN, Pharmacy and [place]: Private Insurance: [employer]  Follow-up services arranged: Outpatient: PT/OT/ST and Patient/Family request agency HH: [facility], DME: no DME was recommended by therapy team  Comments (or additional information):  Pt's dtr and other family members will take "shifts" to be with pt 24/7.  Dtr came for family education with ST and will share this information with other family caregivers.  Pt is doing well functionally and will be well served to be challenged at [employer].  Speech remains an important area of focus.  Patient/Family verbalized understanding of follow-up arrangements: Yes  Individual responsible for coordination of the follow-up plan: Tamsen Roers - pt's dtr  Confirmed correct DME delivered: Trey Sailors 09/12/2017    Prather Failla, Silvestre Mesi

## 2017-09-15 ENCOUNTER — Encounter: Payer: Self-pay | Admitting: Physical Therapy

## 2017-09-15 ENCOUNTER — Ambulatory Visit: Payer: Medicare Other | Attending: Physical Medicine & Rehabilitation | Admitting: Occupational Therapy

## 2017-09-15 ENCOUNTER — Ambulatory Visit: Payer: Medicare Other | Admitting: Speech Pathology

## 2017-09-15 ENCOUNTER — Encounter: Payer: Self-pay | Admitting: Speech Pathology

## 2017-09-15 ENCOUNTER — Ambulatory Visit: Payer: Medicare Other | Admitting: Physical Therapy

## 2017-09-15 ENCOUNTER — Telehealth: Payer: Self-pay

## 2017-09-15 ENCOUNTER — Encounter: Payer: Self-pay | Admitting: Occupational Therapy

## 2017-09-15 VITALS — BP 129/56

## 2017-09-15 DIAGNOSIS — R2681 Unsteadiness on feet: Secondary | ICD-10-CM

## 2017-09-15 DIAGNOSIS — M6281 Muscle weakness (generalized): Secondary | ICD-10-CM

## 2017-09-15 DIAGNOSIS — I69315 Cognitive social or emotional deficit following cerebral infarction: Secondary | ICD-10-CM | POA: Diagnosis present

## 2017-09-15 DIAGNOSIS — R4701 Aphasia: Secondary | ICD-10-CM | POA: Diagnosis present

## 2017-09-15 NOTE — Telephone Encounter (Signed)
Transitional Care call  [facility] DOB: (1941-06-06) 1. Are you/is patient experiencing any problems since coming home? (YES, ISSUES SLEEPING) a. Are there any questions regarding any aspect of care? (NO) 2. Are there any questions regarding medications administration/dosing? (NO) a. Are meds being taken as prescribed? (YES) b. "Patient should review meds with caller to confirm"  3. Have there been any falls? (NO) 4. Has Home Health been to the house and/or have they contacted you? (YES) a. If not, have you tried to contact them? (NA) b. Can we help you contact them? (NA) 5. Are bowels and bladder emptying properly? (YES) a. Are there any unexpected incontinence issues? (NO) b. If applicable, is patient following bowel/bladder programs? (NA) 6. Any fevers, problems with breathing, unexpected pain? (NO) 7. Are there any skin problems or new areas of breakdown? (NO) 8. Has the patient/family member arranged specialty MD follow up (ie cardiology/neurology/renal/surgical/etc.)?  (YES) a. Can we help arrange? (NO) 9. Does the patient need any other services or support that we can help arrange? (NO) 10. Are caregivers following through as expected in assisting the patient? (YES) 11. Has the patient quit smoking, drinking alcohol, or using drugs as recommended? (NA)  Appointment date/time (09-23-2017 / 115PM), arrive time (245PM) and who it is with here (DR. Wynn Banker) [address] [phone]

## 2017-09-15 NOTE — Telephone Encounter (Signed)
Patient can try melatonin 3-5 mg daily at bedtime for sleep

## 2017-09-15 NOTE — Patient Instructions (Signed)
High Stepping in Place (Sitting)    Sitting, alternately lift knees as high as possible. Keep torso erect. Repeat __20__ times, each leg.  Copyright  VHI. All rights reserved.  ABDUCTION: Sitting - Resistance Band (Active)    Sit with feet flat. Lift right leg slightly and, against yellow resistance band, draw it out to side. Complete __2_ sets of __10_ repetitions. Perform __2_ sessions per day.  Copyright  VHI. All rights reserved.

## 2017-09-15 NOTE — Therapy (Signed)
[facility] MAIN [facility] SERVICES [address] [place], [place], [address] Phone: [phone]   Fax:  [phone]  Occupational Therapy Evaluation  Patient Details  Name: Yvette Collier MRN: [id number] Date of Birth: 12/21/1941 Referring Provider: Dr. Wynn Banker  Encounter Date: 09/15/2017      OT End of Session - 09/15/17 1144    Visit Number 1   Number of Visits 24   Date for OT Re-Evaluation 12/08/17   OT Start Time 0905   OT Stop Time 0945   OT Time Calculation (min) 40 min   Activity Tolerance Patient tolerated treatment well   Behavior During Therapy [facility] for tasks assessed/performed      Past Medical History:  Diagnosis Date  . Asthma   . Chronic lower back pain   . COPD (chronic obstructive pulmonary disease) (HCC)   . Diabetes mellitus without complication (HCC)   . Gastrointestinal bleed   . GERD (gastroesophageal reflux disease)   . History of colon polyps   . Hypertension   . Hypertensive heart disease   . IBS (irritable bowel syndrome)   . Ischemic colitis (HCC)   . Non-obstructive Coronary Artery Disease    a. 05/2009 [facility]): minimal nonobs dzs.  . Osteoarthritis   . PAF (paroxysmal atrial fibrillation) (HCC)    a. 09/2014 during GI illness;  b. CHA2DS2VASc = 5 (not currently on OAC 2/2 h/o GIB/ischemic colitis); c. 09/2014 Echo: EF 60-65%, imparied relaxation, nl RV size/fxn.  . Transient cerebral ischemia due to atrial fibrillation [facility])     Past Surgical History:  Procedure Laterality Date  . ABDOMINAL HYSTERECTOMY    . APPENDECTOMY    . BACK SURGERY     x 3, last 2004.  . CHOLECYSTECTOMY    . COLONOSCOPY WITH PROPOFOL N/A 03/20/2017   Procedure: COLONOSCOPY WITH PROPOFOL;  Surgeon: Wyline Mood, MD;  Location: [facility] ENDOSCOPY;  Service: Endoscopy;  Laterality: N/A;  . ESOPHAGOGASTRODUODENOSCOPY (EGD) WITH PROPOFOL N/A 03/20/2017   Procedure: ESOPHAGOGASTRODUODENOSCOPY (EGD) WITH PROPOFOL;  Surgeon: Wyline Mood,  MD;  Location: [facility] ENDOSCOPY;  Service: Endoscopy;  Laterality: N/A;  . FOOT SURGERY Right   . HEMORRHOID BANDING    . left total hip arthroplasty  2012  . STOMACH SURGERY    . TONSILLECTOMY      There were no vitals filed for this visit.      Subjective Assessment - 09/15/17 1128    Subjective  Pt. was very late for the treament session. Pt. arrived wih her daughter, who waited in the waiting room.   Pertinent History Pt. is a 76 y.o. female who suffered a Left temporla Lobe Hemorrhage secondary to Hypertensive Crisis, Moderate ICH, Left Frontal Trace SDH, and Traumatic ICH secondary to a fall. Pt. PMHX includes: HTN, DM with peripheral Neuropathy, Acute Kidney Injury, Hyperlipdidemia, and history of a GI Bleed. Pt. went the inpatient rehab at [facility], was discharged, and now is ready for outpatient rehab services.   Currently in Pain? No/denies           [facility] OT Assessment - 09/15/17 0001      Assessment   Diagnosis CVA   Referring Provider Dr. Wynn Banker     Balance Screen   Has the patient fallen in the past 6 months No   Has the patient had a decrease in activity level because of a fear of falling?  No   Is the patient reluctant to leave their home because of a fear of  falling?  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     ADL   Eating/Feeding Independent   Grooming Supervision/safety   Upper Body Bathing Supervision/safety   Upper Body Dressing Minimal assistance   Lower Body Dressing Minimal assistance   Sport and exercise psychologist -  Hygiene Supervision/safety     IADL   Shopping Needs to be  accompanied on any shopping trip   Light Housekeeping Needs help with all home maintenance tasks   Meal Prep Needs to have meals prepared and served   Medication Management Is not capable of dispensing or managing own medication   Financial Management Dependent     Written Expression   Dominant Hand Right     Vision - History   Baseline Vision --  Unable to determine   Visual History --  Glasses for reading     Activity Tolerance   Activity Tolerance Tolerates 10-20 min activity with muiltiple rests     Cognition   Overall Cognitive Status Impaired/Different from baseline     Sensation   Light Touch Appears Intact     Coordination   Right 9 Hole Peg Test 29   Left 9 Hole Peg Test 29     Strength   Overall Strength Comments 4/5 overall BUE strength     Hand Function   Right Hand Grip (lbs) 30   Right Hand Lateral Pinch 12 lbs   Right Hand 3 Point Pinch 8 lbs   Left Hand Grip (lbs) 28   Left Hand Lateral Pinch 10 lbs   Left 3 point pinch 6 lbs                           OT Education - 09/15/17 1143    Education provided Yes   Education Details FMC, skills, UE functioning.   Person(s) Educated Patient   Methods Explanation;Demonstration   Comprehension Verbalized understanding            OT Long Term Goals - 09/15/17 1208      OT LONG TERM GOAL #1   Title Pt. will be independent with basic ADL care needs.   Baseline Eval: Pt. requires assist   Time 12   Period Weeks   Status New   Target Date 12/08/17     OT LONG TERM GOAL #2   Title Pt. will be independent with light meal preparation.   Baseline Eval: Pt. is unable   Time 12   Period Weeks   Status New   Target Date 12/08/17     OT LONG TERM GOAL #3   Title Pt. will be independent with tub/shower transfers   Baseline Eval: Pt. requires assist   Time 12   Period Weeks   Status New   Target Date 12/08/17     OT LONG TERM GOAL #4   Title Pt. will increase BUE strength by 2  mm grades to assist with ADLs.   Baseline Eval: BUE strength 4/5 overall   Time 12   Period Weeks   Status New  Target Date 12/08/17     OT LONG TERM GOAL #5   Title Pt. will improve right [facility] skills to be able to assist with buttoning, and zipping.    Baseline Eval: impaired   Time 12   Period Weeks   Status New   Target Date 12/08/17     OT LONG TERM GOAL #6   Title Pt. will improve right grip strength to be able to open containers.   Baseline Eval: Limited.   Time 12   Period Weeks   Target Date 12/08/17     OT LONG TERM GOAL #7   Title Pt. will identify potential safety hazards accurately during ADLs, and IADLs with 100% accuracy.   Baseline Eval: limited   Time 5   Status New   Target Date 12/08/17     OT LONG TERM GOAL #8   Title Pt. will demonstrate cognitive compensatory techniques 100% of the time with minimal cues.   Baseline Eval: limited   Time 12   Period Weeks   Status New   Target Date 12/08/17               Plan - 10/14/17 1146    Clinical Impression Statement Pt. is a 76 y.o. femles who suffered from a Left temporal lobe hemorrhage  secondary to hypertensive crisis, Left moderate ICH, Left Frontal trace SDH, and Traumatic ICH secondary to a fall. Pt. was previously living at home independently, and was working as a Education officer, environmental. Pt. presents with right sided weakness wihich limits her ability to complete daily ADL, and IADL tasks. Pt. will benefit from OT services to improve functional performance, right UE functioning, Cognitive IADL tasks.   Occupational Profile and client history currently impacting functional performance Pt. was working as a Education officer, environmental.   Occupational performance deficits (Please refer to evaluation for details): ADL's;IADL's   Rehab Potential Good   OT Frequency 2x / week   OT Duration 12 weeks   OT Treatment/Interventions Self-care/ADL training;Therapeutic exercise;DME and/or AE instruction;Patient/family education;Therapeutic  activities;Therapeutic exercises;Energy conservation   Clinical Decision Making Several treatment options, min-mod task modification necessary   Consulted and Agree with Plan of Care Patient      Patient will benefit from skilled therapeutic intervention in order to improve the following deficits and impairments:  Abnormal gait, Decreased activity tolerance, Decreased cognition, Decreased balance, Decreased coordination, Decreased strength, Impaired UE functional use  Visit Diagnosis: Muscle weakness (generalized)      G-Codes - 2017/10/14 1218-05-23    Functional Assessment Tool Used (Outpatient only) Clinical judgement based on pt. functional level, 9-hole peg test   Functional Limitation Self care   Self Care Current Status (Z6109) At least 40 percent but less than 60 percent impaired, limited or restricted   Self Care Goal Status (U0454) At least 1 percent but less than 20 percent impaired, limited or restricted      Problem List Patient Active Problem List   Diagnosis Date Noted  . Gait disturbance, post-stroke 09/04/2017  . Wernicke's aphasia 09/04/2017  . 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 However, needs to rule out CAA, tumor or CVT (vein of labbe)  09/01/2017  . Constipation 01/06/2017  . DJD (degenerative joint disease) of knee 07/18/2016  . GIB (gastrointestinal bleeding) 05/25/2016  . GI bleed 05/24/2016  . Degenerative joint disease (DJD) of hip 05/08/2016  . 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 Hypertensive heart  disease   . Hemorrhage of gastrointestinal tract 11/13/2015  . Lumbosacral facet joint syndrome (HCC) 07/26/2015  . Angioedema 07/13/2015  . DM2 (diabetes mellitus, type 2) (HCC) 07/13/2015  . Status post lumbar laminectomy 06/19/2015  . Lumbar radiculopathy 06/19/2015  . DDD (degenerative disc disease), lumbar 05/11/2015  . Facet syndrome, lumbar (HCC)  05/11/2015  . Lumbar post-laminectomy syndrome 05/11/2015  . Sacroiliac joint disease 05/11/2015  . 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 Neuropathy due to secondary diabetes (HCC) 05/11/2015  . Essential hypertension 01/03/2015  . Coronary atherosclerosis of native coronary artery   . PAF (paroxysmal atrial fibrillation) (HCC) 10/20/2014  . Ischemic colitis (HCC) 10/20/2014  . Diabetes mellitus type 2 with complications (HCC) 10/20/2014  . COPD (chronic obstructive pulmonary disease) (HCC) 10/20/2014  . Hyperlipidemia 10/20/2014  . GERD (gastroesophageal reflux disease) 10/20/2014  . Ingrowing nail, right great toe 04/27/2014  . Internal hemorrhoid, bleeding 07/14/2013    Olegario Messier, MS, OTR/L 09/15/2017, 12:20 PM  [facility] MAIN [facility] SERVICES [address] [place], [place], [address] Phone: [phone]   Fax:  [phone]  Name: Yvette Collier MRN: [id number] Date of Birth: [birth year]

## 2017-09-15 NOTE — Therapy (Signed)
[place] [facility] MAIN [facility] SERVICES [address] [place], [place], [address] Phone: [phone]   Fax:  [phone]  Speech Language Pathology Evaluation  Patient Details  Name: Yvette Collier MRN: [id number] Date of Birth: [birth year] Referring Provider: Dr. Wynn Banker  Encounter Date: 09/15/2017      End of Session - 09/15/17 1327    Visit Number 1   Number of Visits 25   Date for SLP Re-Evaluation 12/15/17   SLP Start Time 1000   SLP Stop Time  1100   SLP Time Calculation (min) 60 min   Activity Tolerance Patient tolerated treatment well      Past Medical History:  Diagnosis Date  . Asthma   . Chronic lower back pain   . COPD (chronic obstructive pulmonary disease) (HCC)   . Diabetes mellitus without complication (HCC)   . Gastrointestinal bleed   . GERD (gastroesophageal reflux disease)   . History of colon polyps   . Hypertension   . Hypertensive heart disease   . IBS (irritable bowel syndrome)   . Ischemic colitis (HCC)   . Non-obstructive Coronary Artery Disease    a. 05/2009 [facility]): minimal nonobs dzs.  . Osteoarthritis   . PAF (paroxysmal atrial fibrillation) (HCC)    a. 09/2014 during GI illness;  b. CHA2DS2VASc = 5 (not currently on OAC 2/2 h/o GIB/ischemic colitis); c. 09/2014 Echo: EF 60-65%, imparied relaxation, nl RV size/fxn.  . Transient cerebral ischemia due to atrial fibrillation [facility])     Past Surgical History:  Procedure Laterality Date  . ABDOMINAL HYSTERECTOMY    . APPENDECTOMY    . BACK SURGERY     x 3, last 2004.  . CHOLECYSTECTOMY    . COLONOSCOPY WITH PROPOFOL N/A 03/20/2017   Procedure: COLONOSCOPY WITH PROPOFOL;  Surgeon: Wyline Mood, MD;  Location: [facility] ENDOSCOPY;  Service: Endoscopy;  Laterality: N/A;  . ESOPHAGOGASTRODUODENOSCOPY (EGD) WITH PROPOFOL N/A 03/20/2017   Procedure: ESOPHAGOGASTRODUODENOSCOPY (EGD) WITH PROPOFOL;  Surgeon: Wyline Mood, MD;  Location: [facility] ENDOSCOPY;  Service: Endoscopy;   Laterality: N/A;  . FOOT SURGERY Right   . HEMORRHOID BANDING    . left total hip arthroplasty  2012  . STOMACH SURGERY    . TONSILLECTOMY      There were no vitals filed for this visit.          SLP Evaluation [facility] - 09/15/17 1321      SLP Visit Information   SLP Received On 09/15/17   Referring Provider Dr. Wynn Banker   Onset Date 09/01/2017   Medical Diagnosis Left temporal lobe hemorrhage     Subjective   Subjective The patient is aware of and frustrated by poor communication skills.   Patient/Family Stated Goal Maximize communication     General Information   HPI 76 year old woman with left temporal lobe hemorrhage on 09/01/2017.       Prior Functional Status   Cognitive/Linguistic Baseline Within functional limits     Auditory Comprehension   Overall Auditory Comprehension Impaired   Yes/No Questions Impaired   Basic Biographical Questions 26-50% accurate   Basic Immediate Environment Questions 25-49% accurate   Commands Impaired   One Step Basic Commands 0-24% accurate   Overall Auditory Comprehension Comments Able to respond accurately to paralinguistic info     Expression   Primary Mode of Expression Verbal     Verbal Expression   Overall Verbal Expression Impaired   Initiation No impairment   Repetition No impairment   Level  of Impairment Phrase level   Naming Impairment   Responsive 0-25% accurate   Confrontation 0-24% accurate   Pragmatics No impairment     Oral Motor/Sensory Function   Overall Oral Motor/Sensory Function Appears within functional limits for tasks assessed     Motor Speech   Overall Motor Speech Appears within functional limits for tasks assessed     Standardized Assessments   Standardized Assessments  Western Aphasia Battery revised       Western Aphasia Battery- Revised  Spontaneous Speech      Information content  2/10       Fluency   7/10     Comprehension     Yes/No questions  21/60        Auditory Word  Recognition 25/60        Sequential Commands 18/80    Repetition   28/100     Naming    Object Naming  0/60        Word Fluency   0/20        Sentence Completion 0/10        Responsive Speech   0/10     Aphasia Quotient  30/100        SLP Education - 09/15/17 1326    Education provided Yes   Education Details plan of care   Person(s) Educated Patient   Methods Explanation   Comprehension Verbalized understanding            SLP Long Term Goals - 09/15/17 1329      SLP LONG TERM GOAL #1   Title Patient will complete 2 unit processing tasks with 80% accuracy without the need of repetition of task instructions or significant delays in responding.   Time 8   Period Weeks   Status New   Target Date 11/15/17     SLP LONG TERM GOAL #2   Title Patient will complete 3 unit processing tasks with 80% accuracy without the need of repetition of task instructions or significant delays in responding.   Time 12   Period Weeks   Status New   Target Date 12/15/17     SLP LONG TERM GOAL #3   Title Patient will name common objects with 50% accuracy.   Time 8   Period Weeks   Status New   Target Date 11/15/17     SLP LONG TERM GOAL #4   Title Patient will name common objects with 80% accuracy.   Time 12   Period Weeks   Status New   Target Date 12/15/17     SLP LONG TERM GOAL #5   Title Patient will generate meaningful phrase to complete simple/concrete linguistic task with 80% accuracy.   Time 12   Period Weeks   Status New   Target Date 12/15/17          Plan - 09/15/17 1328    Clinical Impression Statement At 78 weeks post onset of left temporal lobe hemorrhage, the patient is presenting with severe aphasia (most consistent with Wernicke's aphasia) characterized by fluent/voluble speech, impaired repetition, and decreased auditory comprehension.  Per documentation, the patient has improved in language skills and can be expected to continue to improve.  The patient will  benefit from skilled speech therapy for restorative and compensatory treatment of aphasia.     Speech Therapy Frequency 2x / week   Duration Other (comment)  12 weeks   Treatment/Interventions Language facilitation;Multimodal communcation approach;SLP instruction and feedback;Patient/family education   Potential  to Achieve Goals Good   Potential Considerations Ability to learn/carryover information;Co-morbidities;Cooperation/participation level;Medical prognosis;Pain level;Previous level of function;Severity of impairments;Family/community support   SLP Home Exercise Plan To be determined   Consulted and Agree with Plan of Care Patient      Patient will benefit from skilled therapeutic intervention in order to improve the following deficits and impairments:   Aphasia - Plan: SLP plan of care cert/re-cert      G-Codes - 24-Sep-2017 1332    Functional Assessment Tool Used WAB, clinical judgment   Functional Limitations Spoken language comprehension   Spoken Language Comprehension Current Status [phone]) At least 60 percent but less than 80 percent impaired, limited or restricted   Spoken Language Comprehension Goal Status (G9160) At least 20 percent but less than 40 percent impaired, limited or restricted      Problem List Patient Active Problem List   Diagnosis Date Noted  . Gait disturbance, post-stroke 09/04/2017  . Wernicke's aphasia 09/04/2017  . 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 However, needs to rule out CAA, tumor or CVT (vein of labbe)  09/01/2017  . Constipation 01/06/2017  . DJD (degenerative joint disease) of knee 07/18/2016  . GIB (gastrointestinal bleeding) 05/25/2016  . GI bleed 05/24/2016  . Degenerative joint disease (DJD) of hip 05/08/2016  . 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 Hypertensive heart disease   . Hemorrhage of gastrointestinal tract 11/13/2015  .  Lumbosacral facet joint syndrome (HCC) 07/26/2015  . Angioedema 07/13/2015  . DM2 (diabetes mellitus, type 2) (HCC) 07/13/2015  . Status post lumbar laminectomy 06/19/2015  . Lumbar radiculopathy 06/19/2015  . DDD (degenerative disc disease), lumbar 05/11/2015  . Facet syndrome, lumbar (HCC) 05/11/2015  . Lumbar post-laminectomy syndrome 05/11/2015  . Sacroiliac joint disease 05/11/2015  . 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 Neuropathy due to secondary diabetes (HCC) 05/11/2015  . Essential hypertension 01/03/2015  . Coronary atherosclerosis of native coronary artery   . PAF (paroxysmal atrial fibrillation) (HCC) 10/20/2014  . Ischemic colitis (HCC) 10/20/2014  . Diabetes mellitus type 2 with complications (HCC) 10/20/2014  . COPD (chronic obstructive pulmonary disease) (HCC) 10/20/2014  . Hyperlipidemia 10/20/2014  . GERD (gastroesophageal reflux disease) 10/20/2014  . Ingrowing nail, right great toe 04/27/2014  . Internal hemorrhoid, bleeding 07/14/2013   Dollene Primrose, MS/CCC- SLP  Leandrew Koyanagi September 24, 2017, 1:36 PM  [place] [facility] MAIN [facility] SERVICES [address] [place], [place], [address] Phone: [phone]   Fax:  [phone]  Name: Yvette Collier MRN: [id number] Date of Birth: July 21, 1941

## 2017-09-15 NOTE — Telephone Encounter (Signed)
During TC call, patients caregiver mentioned that the patient has been having issues of obtaining sleep and wanted to know if there was anything you suggested or can she can take to help out

## 2017-09-15 NOTE — Therapy (Signed)
[facility] MAIN [facility] SERVICES [address] [place], [place], [address] Phone: [phone]   Fax:  [phone]  Physical Therapy Evaluation  Patient Details  Name: Yvette Collier MRN: [id number] Date of Birth: 03/21/1941 Referring Provider: BURNS, HARRIETT P  Encounter Date: 09/15/2017      PT End of Session - 09/15/17 1200    Visit Number 1   Number of Visits 25   Date for PT Re-Evaluation 12/08/17   Authorization Type g code 1/10   PT Start Time 1102   PT Stop Time 1155   PT Time Calculation (min) 53 min   Equipment Utilized During Treatment Gait belt   Activity Tolerance Patient tolerated treatment well   Behavior During Therapy WFL for tasks assessed/performed      Past Medical History:  Diagnosis Date  . Asthma   . Chronic lower back pain   . COPD (chronic obstructive pulmonary disease) (HCC)   . Diabetes mellitus without complication (HCC)   . Gastrointestinal bleed   . GERD (gastroesophageal reflux disease)   . History of colon polyps   . Hypertension   . Hypertensive heart disease   . IBS (irritable bowel syndrome)   . Ischemic colitis (HCC)   . Non-obstructive Coronary Artery Disease    a. 05/2009 Cath [facility]): minimal nonobs dzs.  . Osteoarthritis   . PAF (paroxysmal atrial fibrillation) (HCC)    a. 09/2014 during GI illness;  b. CHA2DS2VASc = 5 (not currently on OAC 2/2 h/o GIB/ischemic colitis); c. 09/2014 Echo: EF 60-65%, imparied relaxation, nl RV size/fxn.  . Transient cerebral ischemia due to atrial fibrillation [facility])     Past Surgical History:  Procedure Laterality Date  . ABDOMINAL HYSTERECTOMY    . APPENDECTOMY    . BACK SURGERY     x 3, last 2004.  . CHOLECYSTECTOMY    . COLONOSCOPY WITH PROPOFOL N/A 03/20/2017   Procedure: COLONOSCOPY WITH PROPOFOL;  Surgeon: Wyline Mood, MD;  Location: [facility] ENDOSCOPY;  Service: Endoscopy;  Laterality: N/A;  . ESOPHAGOGASTRODUODENOSCOPY (EGD) WITH PROPOFOL N/A 03/20/2017    Procedure: ESOPHAGOGASTRODUODENOSCOPY (EGD) WITH PROPOFOL;  Surgeon: Wyline Mood, MD;  Location: [facility] ENDOSCOPY;  Service: Endoscopy;  Laterality: N/A;  . FOOT SURGERY Right   . HEMORRHOID BANDING    . left total hip arthroplasty  2012  . STOMACH SURGERY    . TONSILLECTOMY      Vitals:   09/15/17 1105  BP: (!) 129/56         Subjective Assessment - 09/15/17 1105    Subjective Pt states that she felt a pain in her head that resulted in subsequent weakness of her UE's and LE's.  Pt states that she has some pain in her neck, but that her main issue is weakness, LE > UE.   Pertinent History Yvette Collier a 76 y.o.right handed femalewith history of hypertension,[name],diabetes mellitus, COPD. Patient lives alone independent prior to admission and works as a Education officer, environmental. One level home. Admitted 09/01/2017 with altered mental statusright side weaknessas well as aphasia. CT of the head showed a large acute left temporal lobe hematoma approximately 15.9 mL suspect hypertensive hemorrhage.Pt presents today with fluent aphasia.   Limitations Walking   How long can you sit comfortably? 30 minutes before needing to shift positions   How long can you stand comfortably? about 30 minutes before needing to sit and rest   How long can you walk comfortably? states  that she has no issues with walking   Patient Stated Goals "get stronger"   Currently in Pain? No/denies   Pain Score 2    Pain Location Back   Pain Orientation Mid   Multiple Pain Sites No      PAIN: states that she has mild pain (1/10) in her lower back   POSTURE: WFL  AROM: limited by strength deficits  STRENGTH:  Graded on a 0-5 scale Muscle Group Left Right  Shoulder flex 4/5 5/5  Shoulder Abd 4/5 4/5                  Hip Flex 4-/5 4/5  Hip Abd 3+/5 4/5  Hip Add 3-/5 4-/5  Hip Ext 2+/5 3-/5      Knee Flex 5/5 5/5  Knee Ext 5/5 5/5  Ankle DF 4+/5 4+/5  Ankle PF       FUNCTIONAL MOBILITY: WFL's, mild dizziness noted with transitions, especially rolling    BALANCE: Berg Score - 41/56   GAIT: Pt ambulates with SPC; demonstrates decreased trunk rotation and decreased B arm swing, pt has decreased heel strike on B LE's  OUTCOME MEASURES: TEST Outcome Interpretation  5 times sit<>stand 18 sec >60 yo, >15 sec indicates increased risk for falls  10 meter walk test 0.688               M/s, spc <1.0 m/s indicates increased risk for falls; limited community ambulator  Timed up and Go                 19 sec <14 sec indicates increased risk for falls  6 minute walk test      935          Feet,spc 1000 feet is community Forensic scientist Assessment 41/56 <36/56 (100% risk for falls), 37-45 (80% risk for falls); 46-51 (>50% risk for falls); 52-55 (lower risk <25% of falls)        Standing Dynamic Balance  Normal Stand independently unsupported, able to weight shift and cross midline maximally   Good Stand independently unsupported, able to weight shift and cross midline moderately   Good-/Fair+ Stand independently unsupported, able to weight shift across midline minimally x  Fair Stand independently unsupported, weight shift, and reach ipsilaterally, loss of balance when crossing midline   Poor+ Able to stand with Min A and reach ipsilaterally, unable to weight shift   Poor Able to stand with Mod A and minimally reach ipsilaterally, unable to cross midline.     Static Standing Balance  Normal Able to maintain standing balance against maximal resistance   Good Able to maintain standing balance against moderate resistance   Good-/Fair+ Able to maintain standing balance against minimal resistance x  Fair Able to stand unsupported without UE support and without LOB for 1-2 min   Fair- Requires Min A and UE support to maintain standing without loss of balance   Poor+ Requires mod A and UE support to maintain standing without loss of balance   Poor  Requires max A and UE support to maintain standing balance without loss           [facility] PT Assessment - 09/15/17 1112      Assessment   Medical Diagnosis L temporal hemorrhage    Referring Provider BURNS, HARRIETT P   Onset Date/Surgical Date 09/01/17   Hand Dominance Right   Prior Therapy yes     Precautions   Precautions None  Restrictions   Weight Bearing Restrictions No     Balance Screen   Has the patient fallen in the past 6 months Yes   How many times? 2   Has the patient had a decrease in activity level because of a fear of falling?  No     Home Environment   Living Environment Private residence   Living Arrangements Other relatives   Available Help at Discharge Family   Type of Home House   Home Access Stairs to enter   Entrance Stairs-Number of Steps 1   Home Layout Two level   Home Equipment [place] - single point     Prior Function   Level of Independence Independent with basic ADLs   Vocation Full time employment     Treatment: Seated marches 2 x 20 Seated hip ABD with red theraband 2 x 20  Verbal, visual and tactile cues needed for proper exercise technique.  Pt required verbal cues to continue performing exercises, easily distracted.     Objective measurements completed on examination: See above findings.                  PT Education - 09/15/17 1200    Education provided Yes   Education Details PT plan of care, HEP   Person(s) Educated Patient   Methods Explanation;Demonstration   Comprehension Verbalized understanding          PT Short Term Goals - 09/15/17 1213      PT SHORT TERM GOAL #1   Title Pt will improve 5x sit to stand to 16 seconds in order to demonstrate improved LE strength and improved dynamic balance to decrease risk for falls.   Baseline 18 sec   Time 6   Period Weeks   Status New   Target Date 10/27/17     PT SHORT TERM GOAL #2   Title Pt will improve gait speed to at least 0.8 m/s, demonstrating  improved LE strength in order to safely ambulate with decreased risk for fals.   Baseline 0.688 m/s   Time 6   Period Weeks   Status New   Target Date 10/27/17     PT SHORT TERM GOAL #3   Title Pt will improve TUG to at least 16 seconds in order to demonstrate improve LE strength and dynamic balance.   Baseline 19 sec   Time 6   Period Weeks   Status New   Target Date 10/27/17     PT SHORT TERM GOAL #4   Title Pt will improve Berg Balance score to 46/56 in order to demonstarte improved dynamic and static balance in order to decrease overall falls risk.   Baseline 41/56   Time 6   Period Weeks   Status New   Target Date 10/27/17           PT Long Term Goals - 09/15/17 1216      PT LONG TERM GOAL #1   Title Pt will demonstrate independence with HEP in order to continue LE strengthening and balance interventions at home to manage symptoms and decrease overall falls risk.   Time 12   Period Weeks   Status New   Target Date 12/08/17     PT LONG TERM GOAL #2   Title Pt will improve 5x sit to stand to <15 seconds in order to demonstrate improved LE strength and improved balance in order to decrease falls risk.   Baseline 18 sec   Time 12  Period Weeks   Status New   Target Date 12/08/17     PT LONG TERM GOAL #3   Title Pt will improve gait speed to at least 1.0 m/s in order to demonstrate improved LE strength and balance in order to ambulate with decreased risk for falls.   Baseline 0.688 m/s   Time 12   Period Weeks   Status New   Target Date 12/08/17     PT LONG TERM GOAL #4   Title Pt will demonstrate an improved TUG score to < or equal to 14 seconds in order to demonstrate improved LE strength and balance in order to decrease overall falls risk.   Baseline 19 sec   Time 12   Period Weeks   Status New   Target Date 12/08/17     PT LONG TERM GOAL #5   Title Pt will demonstrate an improved Berg Balance score to at least 51/56 in order to demonstrate improved  dynamic and static balance activities.   Baseline 41/56   Time 12   Period Weeks   Status New   Target Date 12/08/17     Additional Long Term Goals   Additional Long Term Goals Yes     PT LONG TERM GOAL #6   Title Pt will improve LE strength to 4+/5 in all limited planes in order to increase funtional abilities and improve gait.   Baseline Gross strength assessment: 3+/5, L hip ext 2+/5, R hip ext 3-/5   Time 12   Period Weeks   Status New   Target Date 12/08/17                Plan - 10/14/2017 1201    Clinical Impression Statement Pt is a 76 y/o female presenting today with decreased LE strength and decreased balance secondary to recent L temporal lobe hemorrhage.  Pt ambulates with a SPC, but states that she uses it "only when she feels weak" since her recent episode.  Pt has decreased trunk rotation and decreased arm swing with gait, as well as decreased heel strike on B LE's.  Pt has decreased strength BLE and deficits with dynamic standing balance and decreased safety awareness when performing transitions such as sit to stand.  Pt presents fluent aphasia and requires verbal and visual cues to perform activities.  Pt would benefit from skilled therapy services in order to address strength and balance deficits in order to improve functional activity tolerance and decrease falls risk.   History and Personal Factors relevant to plan of care: age, cognitive status, BP, falls risk   Clinical Presentation Evolving   Clinical Presentation due to: BP fluctuations, falls risk   Clinical Decision Making Moderate   Rehab Potential Good   PT Frequency 2x / week   PT Duration 12 weeks   PT Treatment/Interventions Aquatic Therapy;Cryotherapy;Moist Heat;Gait training;Stair training;Functional mobility training;Therapeutic activities;Therapeutic exercise;Balance training;Neuromuscular re-education;Patient/family education;Manual techniques;Passive range of motion   PT Next Visit Plan begin LE  strength and dynamic balance activities   PT Home Exercise Plan seated marches, seated hip ABD with red theraband   Consulted and Agree with Plan of Care Patient      Patient will benefit from skilled therapeutic intervention in order to improve the following deficits and impairments:  Abnormal gait, Decreased coordination, Decreased activity tolerance, Decreased balance, Decreased cognition, Decreased mobility, Decreased safety awareness, Decreased strength, Difficulty walking  Visit Diagnosis: Muscle weakness (generalized)  Unsteadiness on feet      G-Codes - 14-Oct-2017  1207    Functional Assessment Tool Used (Outpatient Only) 5x sit to stand, [employer], 28m WT, 6 min WT, clinical judgement   Functional Limitation Mobility: Walking and moving around   Mobility: Walking and Moving Around Current Status [phone]) At least 60 percent but less than 80 percent impaired, limited or restricted   Mobility: Walking and Moving Around Goal Status [phone]) At least 40 percent but less than 60 percent impaired, limited or restricted       Problem List Patient Active Problem List   Diagnosis Date Noted  . Gait disturbance, post-stroke 09/04/2017  . Wernicke's aphasia 09/04/2017  . 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 However, needs to rule out CAA, tumor or CVT (vein of labbe)  09/01/2017  . Constipation 01/06/2017  . DJD (degenerative joint disease) of knee 07/18/2016  . GIB (gastrointestinal bleeding) 05/25/2016  . GI bleed 05/24/2016  . Degenerative joint disease (DJD) of hip 05/08/2016  . 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 Hypertensive heart disease   . Hemorrhage of gastrointestinal tract 11/13/2015  . Lumbosacral facet joint syndrome (HCC) 07/26/2015  . Angioedema 07/13/2015  . DM2 (diabetes mellitus, type 2) (HCC) 07/13/2015  . Status post lumbar laminectomy 06/19/2015  . Lumbar  radiculopathy 06/19/2015  . DDD (degenerative disc disease), lumbar 05/11/2015  . Facet syndrome, lumbar (HCC) 05/11/2015  . Lumbar post-laminectomy syndrome 05/11/2015  . Sacroiliac joint disease 05/11/2015  . 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 Neuropathy due to secondary diabetes (HCC) 05/11/2015  . Essential hypertension 01/03/2015  . Coronary atherosclerosis of native coronary artery   . PAF (paroxysmal atrial fibrillation) (HCC) 10/20/2014  . Ischemic colitis (HCC) 10/20/2014  . Diabetes mellitus type 2 with complications (HCC) 10/20/2014  . COPD (chronic obstructive pulmonary disease) (HCC) 10/20/2014  . Hyperlipidemia 10/20/2014  . GERD (gastroesophageal reflux disease) 10/20/2014  . Ingrowing nail, right great toe 04/27/2014  . Internal hemorrhoid, bleeding 07/14/2013   This entire session was performed under direct supervision and direction of a licensed therapist/therapist assistant . I have personally read, edited and approve of the note as written. Stacey Drain, SPT Ezekiel Ina, PT, DPT  09/15/2017, 1:27 PM  [facility] MAIN [facility] SERVICES [address] [place], [place], [address] Phone: [phone]   Fax:  [phone]  Name: Yvette Collier MRN: [id number] Date of Birth: 1941/08/15

## 2017-09-17 NOTE — Telephone Encounter (Signed)
Information given to Ms Azucena Kuba [facility])

## 2017-09-18 ENCOUNTER — Ambulatory Visit: Payer: Medicare Other | Admitting: Occupational Therapy

## 2017-09-18 ENCOUNTER — Ambulatory Visit: Payer: Medicare Other | Admitting: Physical Therapy

## 2017-09-18 ENCOUNTER — Encounter: Payer: Self-pay | Admitting: Physical Therapy

## 2017-09-18 ENCOUNTER — Encounter: Payer: Self-pay | Admitting: Occupational Therapy

## 2017-09-18 DIAGNOSIS — R2681 Unsteadiness on feet: Secondary | ICD-10-CM

## 2017-09-18 DIAGNOSIS — M6281 Muscle weakness (generalized): Secondary | ICD-10-CM | POA: Diagnosis not present

## 2017-09-18 DIAGNOSIS — I69315 Cognitive social or emotional deficit following cerebral infarction: Secondary | ICD-10-CM

## 2017-09-18 NOTE — Therapy (Signed)
[facility] MAIN [facility] SERVICES [address] [place], [place], [address] Phone: [phone]   Fax:  [phone]  Occupational Therapy Treatment  Patient Details  Name: Yvette Collier MRN: [id number] Date of Birth: August 13, 1941 Referring Provider: Dr. Wynn Banker  Encounter Date: 09/18/2017      OT End of Session - 09/18/17 1310    Visit Number 2   Number of Visits 24   Date for OT Re-Evaluation 12/08/17   OT Start Time 1300   OT Stop Time 1345   OT Time Calculation (min) 45 min   Activity Tolerance Patient tolerated treatment well   Behavior During Therapy [facility] for tasks assessed/performed      Past Medical History:  Diagnosis Date  . Asthma   . Chronic lower back pain   . COPD (chronic obstructive pulmonary disease) (HCC)   . Diabetes mellitus without complication (HCC)   . Gastrointestinal bleed   . GERD (gastroesophageal reflux disease)   . History of colon polyps   . Hypertension   . Hypertensive heart disease   . IBS (irritable bowel syndrome)   . Ischemic colitis (HCC)   . Non-obstructive Coronary Artery Disease    a. 05/2009 Cath [facility]): minimal nonobs dzs.  . Osteoarthritis   . PAF (paroxysmal atrial fibrillation) (HCC)    a. 09/2014 during GI illness;  b. CHA2DS2VASc = 5 (not currently on OAC 2/2 h/o GIB/ischemic colitis); c. 09/2014 Echo: EF 60-65%, imparied relaxation, nl RV size/fxn.  . Transient cerebral ischemia due to atrial fibrillation [facility])     Past Surgical History:  Procedure Laterality Date  . ABDOMINAL HYSTERECTOMY    . APPENDECTOMY    . BACK SURGERY     x 3, last 2004.  . CHOLECYSTECTOMY    . COLONOSCOPY WITH PROPOFOL N/A 03/20/2017   Procedure: COLONOSCOPY WITH PROPOFOL;  Surgeon: Wyline Mood, MD;  Location: [facility] ENDOSCOPY;  Service: Endoscopy;  Laterality: N/A;  . ESOPHAGOGASTRODUODENOSCOPY (EGD) WITH PROPOFOL N/A 03/20/2017   Procedure: ESOPHAGOGASTRODUODENOSCOPY (EGD) WITH PROPOFOL;  Surgeon: Wyline Mood, MD;   Location: [facility] ENDOSCOPY;  Service: Endoscopy;  Laterality: N/A;  . FOOT SURGERY Right   . HEMORRHOID BANDING    . left total hip arthroplasty  2012  . STOMACH SURGERY    . TONSILLECTOMY      There were no vitals filed for this visit.      Subjective Assessment - 09/18/17 1526    Subjective  Pt. reports  she is doing well.   Pertinent History Pt. is a 76 y.o. female who suffered a Left temporla Lobe Hemorrhage secondary to Hypertensive Crisis, Moderate ICH, Left Frontal Trace SDH, and Traumatic ICH secondary to a fall. Pt. PMHX includes: HTN, DM with peripheral Neuropathy, Acute Kidney Injury, Hyperlipdidemia, and history of a GI Bleed. Pt. went the inpatient rehab at [facility], was discharged, and now is ready for outpatient rehab services.   Currently in Pain? No/denies      OT TREATMENT    Selfcare:  Pt. Worked on distinguishing between safe, and unsafe ADL, and IADL situations using ADL safety cards. Pt. requires increased time, and assist to complete. Pt. Required modification to simplify the task secondary to cognition.                           OT Education - 09/18/17 1309    Education provided Yes   Education Details ADL/IADL Systems developer) Educated Patient  Methods Explanation   Comprehension Verbalized understanding;Returned demonstration            OT Long Term Goals - 09/15/17 1208      OT LONG TERM GOAL #1   Title Pt. will be independent with basic ADL care needs.   Baseline Eval: Pt. requires assist   Time 12   Period Weeks   Status New   Target Date 12/08/17     OT LONG TERM GOAL #2   Title Pt. will be independent with light meal preparation.   Baseline Eval: Pt. is unable   Time 12   Period Weeks   Status New   Target Date 12/08/17     OT LONG TERM GOAL #3   Title Pt. will be independent with tub/shower transfers   Baseline Eval: Pt. requires assist   Time 12   Period Weeks   Status New   Target Date 12/08/17      OT LONG TERM GOAL #4   Title Pt. will increase BUE strength by 2 mm grades to assist with ADLs.   Baseline Eval: BUE strength 4/5 overall   Time 12   Period Weeks   Status New   Target Date 12/08/17     OT LONG TERM GOAL #5   Title Pt. will improve right [facility] skills to be able to assist with buttoning, and zipping.    Baseline Eval: impaired   Time 12   Period Weeks   Status New   Target Date 12/08/17     OT LONG TERM GOAL #6   Title Pt. will improve right grip strength to be able to open containers.   Baseline Eval: Limited.   Time 12   Period Weeks   Target Date 12/08/17     OT LONG TERM GOAL #7   Title Pt. will identify potential safety hazards accurately during ADLs, and IADLs with 100% accuracy.   Baseline Eval: limited   Time 43   Status New   Target Date 12/08/17     OT LONG TERM GOAL #8   Title Pt. will demonstrate cognitive compensatory techniques 100% of the time with minimal cues.   Baseline Eval: limited   Time 12   Period Weeks   Status New   Target Date 12/08/17               Plan - 09/18/17 1701    Clinical Impression Statement Pt. Pt. presents with impaired safety awareness, and judgement ADLs, and IADL tasks. Pt. requires max cues, and assist to distinguish between safe, and unsafe ADL/IADL safety situations. Pt. conitnues to work on improving ADLs, and IADL functioning.   Occupational performance deficits (Please refer to evaluation for details): ADL's;IADL's   Rehab Potential Good   OT Frequency 2x / week   OT Duration 12 weeks   OT Treatment/Interventions Self-care/ADL training;Therapeutic exercise;DME and/or AE instruction;Patient/family education;Therapeutic activities;Therapeutic exercises;Energy conservation   Consulted and Agree with Plan of Care Patient      Patient will benefit from skilled therapeutic intervention in order to improve the following deficits and impairments:  Abnormal gait, Decreased activity tolerance,  Decreased cognition, Decreased balance, Decreased coordination, Decreased strength, Impaired UE functional use  Visit Diagnosis: Muscle weakness (generalized)  Cognitive social or emotional deficit following cerebral infarction    Problem List Patient Active Problem List   Diagnosis Date Noted  . Gait disturbance, post-stroke 09/04/2017  . Wernicke's aphasia 09/04/2017  . Left temporal lobe hemorrhage (HCC) -  left temporal  moderate ICH and left frontal trace SDH, concerning for traumatic ICH due to fall. However, needs to rule out CAA, tumor or CVT (vein of labbe)  09/01/2017  . Constipation 01/06/2017  . DJD (degenerative joint disease) of knee 07/18/2016  . GIB (gastrointestinal bleeding) 05/25/2016  . GI bleed 05/24/2016  . Degenerative joint disease (DJD) of hip 05/08/2016  . 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 Hypertensive heart disease   . Hemorrhage of gastrointestinal tract 11/13/2015  . Lumbosacral facet joint syndrome (HCC) 07/26/2015  . Angioedema 07/13/2015  . DM2 (diabetes mellitus, type 2) (HCC) 07/13/2015  . Status post lumbar laminectomy 06/19/2015  . Lumbar radiculopathy 06/19/2015  . DDD (degenerative disc disease), lumbar 05/11/2015  . Facet syndrome, lumbar (HCC) 05/11/2015  . Lumbar post-laminectomy syndrome 05/11/2015  . Sacroiliac joint disease 05/11/2015  . 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 Neuropathy due to secondary diabetes (HCC) 05/11/2015  . Essential hypertension 01/03/2015  . Coronary atherosclerosis of native coronary artery   . PAF (paroxysmal atrial fibrillation) (HCC) 10/20/2014  . Ischemic colitis (HCC) 10/20/2014  . Diabetes mellitus type 2 with complications (HCC) 10/20/2014  . COPD (chronic obstructive pulmonary disease) (HCC) 10/20/2014  . Hyperlipidemia 10/20/2014  . GERD (gastroesophageal reflux disease) 10/20/2014  . Ingrowing nail, right great toe 04/27/2014  . Internal hemorrhoid,  bleeding 07/14/2013    Olegario Messier, MS, OTR/L 09/18/2017, 5:08 PM   [facility] MAIN [facility] SERVICES [address] [place], [place], [address] Phone: [phone]   Fax:  [phone]  Name: Yvette Collier MRN: [id number] Date of Birth: [birth year]

## 2017-09-18 NOTE — Therapy (Signed)
[facility] MAIN [facility] SERVICES [address] [place], [place], [address] Phone: [phone]   Fax:  [phone]  Physical Therapy Treatment  Patient Details  Name: Yvette Collier MRN: [id number] Date of Birth: April 05, 1941 Referring Provider: BURNS, HARRIETT P  Encounter Date: 09/18/2017      PT End of Session - 09/18/17 1153    Visit Number 2   Number of Visits 25   Date for PT Re-Evaluation 12/08/17   Authorization Type g code 2/10   PT Start Time 1146   PT Stop Time 1230   PT Time Calculation (min) 44 min   Equipment Utilized During Treatment Gait belt   Activity Tolerance Patient tolerated treatment well;Other (comment)  Easily distracted   Behavior During Therapy [facility] for tasks assessed/performed      Past Medical History:  Diagnosis Date  . Asthma   . Chronic lower back pain   . COPD (chronic obstructive pulmonary disease) (HCC)   . Diabetes mellitus without complication (HCC)   . Gastrointestinal bleed   . GERD (gastroesophageal reflux disease)   . History of colon polyps   . Hypertension   . Hypertensive heart disease   . IBS (irritable bowel syndrome)   . Ischemic colitis (HCC)   . Non-obstructive Coronary Artery Disease    a. 05/2009 Cath [facility]): minimal nonobs dzs.  . Osteoarthritis   . PAF (paroxysmal atrial fibrillation) (HCC)    a. 09/2014 during GI illness;  b. CHA2DS2VASc = 5 (not currently on OAC 2/2 h/o GIB/ischemic colitis); c. 09/2014 Echo: EF 60-65%, imparied relaxation, nl RV size/fxn.  . Transient cerebral ischemia due to atrial fibrillation [facility])     Past Surgical History:  Procedure Laterality Date  . ABDOMINAL HYSTERECTOMY    . APPENDECTOMY    . BACK SURGERY     x 3, last 2004.  . CHOLECYSTECTOMY    . COLONOSCOPY WITH PROPOFOL N/A 03/20/2017   Procedure: COLONOSCOPY WITH PROPOFOL;  Surgeon: Wyline Mood, MD;  Location: [facility] ENDOSCOPY;  Service: Endoscopy;  Laterality: N/A;  . ESOPHAGOGASTRODUODENOSCOPY  (EGD) WITH PROPOFOL N/A 03/20/2017   Procedure: ESOPHAGOGASTRODUODENOSCOPY (EGD) WITH PROPOFOL;  Surgeon: Wyline Mood, MD;  Location: [facility] ENDOSCOPY;  Service: Endoscopy;  Laterality: N/A;  . FOOT SURGERY Right   . HEMORRHOID BANDING    . left total hip arthroplasty  2012  . STOMACH SURGERY    . TONSILLECTOMY      There were no vitals filed for this visit.      Subjective Assessment - 09/18/17 1150    Subjective Pt reports that she is feeling good overall today, states that she has been performing HEP at home since intitial evaluation.  Notes mild soreness across lumbar spine, but states that it is nothing new.   Pertinent History Yvette Collier a 76 y.o.right handed femalewith history of hypertension,[name],diabetes mellitus, COPD. Patient lives alone independent prior to admission and works as a Education officer, environmental. One level home. Admitted 09/01/2017 with altered mental statusright side weaknessas well as aphasia. CT of the head showed a large acute left temporal lobe hematoma approximately 15.9 mL suspect hypertensive hemorrhage.Pt presents today with fluent aphasia.   Limitations Walking   How long can you sit comfortably? 30 minutes before needing to shift positions   How long can you stand comfortably? about 30 minutes before needing to sit and rest   How long can you walk comfortably? states that she has no issues with  walking   Patient Stated Goals "get stronger"   Currently in Pain? Yes   Pain Score 1    Pain Location Back   Pain Orientation Mid   Pain Descriptors / Indicators Sore     Treatment:  Review of HEP: seated marches x 20 B LE's  Supine SLR x 20 B LE's  Supine marches x 20 B LE's  Bridges x 15  Prone hip ext x 10   Toe taps to 6 in step x 20 B LE's  Lateral toe taps to 6 in x  20 B LE's   Fwd and backward stepping over 1/2 foam x 20  Lateral stepping over 1/2 foam x 20  Cone taps standing on foam pad x  20  Lateral taps at mirror x 20 B UE's  High-Low taps at mirror x 20 B LE's  Able to follow 3 step command, but easily distracted with tasks - requires constant cueing to continue exercises; pt requires visual, verbal and tactile cues for proper form and technique with all exercises.                                PT Education - 09/18/17 1151    Education provided Yes   Education Details Continuation of HEP   Person(s) Educated Patient   Methods Explanation   Comprehension Verbalized understanding;Returned demonstration          PT Short Term Goals - 09/15/17 1213      PT SHORT TERM GOAL #1   Title Pt will improve 5x sit to stand to 16 seconds in order to demonstrate improved LE strength and improved dynamic balance to decrease risk for falls.   Baseline 18 sec   Time 6   Period Weeks   Status New   Target Date 10/27/17     PT SHORT TERM GOAL #2   Title Pt will improve gait speed to at least 0.8 m/s, demonstrating improved LE strength in order to safely ambulate with decreased risk for fals.   Baseline 0.688 m/s   Time 6   Period Weeks   Status New   Target Date 10/27/17     PT SHORT TERM GOAL #3   Title Pt will improve TUG to at least 16 seconds in order to demonstrate improve LE strength and dynamic balance.   Baseline 19 sec   Time 6   Period Weeks   Status New   Target Date 10/27/17     PT SHORT TERM GOAL #4   Title Pt will improve Berg Balance score to 46/56 in order to demonstarte improved dynamic and static balance in order to decrease overall falls risk.   Baseline 41/56   Time 6   Period Weeks   Status New   Target Date 10/27/17           PT Long Term Goals - 09/15/17 1216      PT LONG TERM GOAL #1   Title Pt will demonstrate independence with HEP in order to continue LE strengthening and balance interventions at home to manage symptoms and decrease overall falls risk.   Time 12   Period Weeks   Status New   Target  Date 12/08/17     PT LONG TERM GOAL #2   Title Pt will improve 5x sit to stand to <15 seconds in order to demonstrate improved LE strength and improved balance in order to decrease falls  risk.   Baseline 18 sec   Time 12   Period Weeks   Status New   Target Date 12/08/17     PT LONG TERM GOAL #3   Title Pt will improve gait speed to at least 1.0 m/s in order to demonstrate improved LE strength and balance in order to ambulate with decreased risk for falls.   Baseline 0.688 m/s   Time 12   Period Weeks   Status New   Target Date 12/08/17     PT LONG TERM GOAL #4   Title Pt will demonstrate an improved TUG score to < or equal to 14 seconds in order to demonstrate improved LE strength and balance in order to decrease overall falls risk.   Baseline 19 sec   Time 12   Period Weeks   Status New   Target Date 12/08/17     PT LONG TERM GOAL #5   Title Pt will demonstrate an improved Berg Balance score to at least 51/56 in order to demonstrate improved dynamic and static balance activities.   Baseline 41/56   Time 12   Period Weeks   Status New   Target Date 12/08/17     Additional Long Term Goals   Additional Long Term Goals Yes     PT LONG TERM GOAL #6   Title Pt will improve LE strength to 4+/5 in all limited planes in order to increase funtional abilities and improve gait.   Baseline Gross strength assessment: 3+/5, L hip ext 2+/5, R hip ext 3-/5   Time 12   Period Weeks   Status New   Target Date 12/08/17               Plan - 09/18/17 1237    Clinical Impression Statement Pt responded well to all treatment interventions today.  LE strengthening exercises and dynamic balance exercises were implemented today without increase of pain or fatigue.  Pt required increased verbal, visual and tactile cueing in order to complete exercises secondary to being easily distracted.  Pt had returned demonstration of HEP and was able to perform exercises correctly and as presecribed  during last sesson.  Pt would continue to benefit from skilled therpay services in order to continue to strengthen LE's and improve balance in order to decrease risk of falls.   Rehab Potential Good   PT Frequency 2x / week   PT Duration 12 weeks   PT Treatment/Interventions Aquatic Therapy;Cryotherapy;Moist Heat;Gait training;Stair training;Functional mobility training;Therapeutic activities;Therapeutic exercise;Balance training;Neuromuscular re-education;Patient/family education;Manual techniques;Passive range of motion   PT Next Visit Plan begin LE strength and dynamic balance activities   PT Home Exercise Plan seated marches, seated hip ABD with red theraband   Consulted and Agree with Plan of Care Patient      Patient will benefit from skilled therapeutic intervention in order to improve the following deficits and impairments:  Abnormal gait, Decreased coordination, Decreased activity tolerance, Decreased balance, Decreased cognition, Decreased mobility, Decreased safety awareness, Decreased strength, Difficulty walking  Visit Diagnosis: Muscle weakness (generalized)  Unsteadiness on feet     Problem List Patient Active Problem List   Diagnosis Date Noted  . Gait disturbance, post-stroke 09/04/2017  . Wernicke's aphasia 09/04/2017  . 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 However, needs to rule out CAA, tumor or CVT (vein of labbe)  09/01/2017  . Constipation 01/06/2017  . DJD (degenerative joint  disease) of knee 07/18/2016  . GIB (gastrointestinal bleeding) 05/25/2016  . GI bleed 05/24/2016  . Degenerative joint disease (DJD) of hip 05/08/2016  . 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 Hypertensive heart disease   . Hemorrhage of gastrointestinal tract 11/13/2015  . Lumbosacral facet joint syndrome (HCC) 07/26/2015  . Angioedema 07/13/2015  . DM2 (diabetes mellitus, type 2) (HCC)  07/13/2015  . Status post lumbar laminectomy 06/19/2015  . Lumbar radiculopathy 06/19/2015  . DDD (degenerative disc disease), lumbar 05/11/2015  . Facet syndrome, lumbar (HCC) 05/11/2015  . Lumbar post-laminectomy syndrome 05/11/2015  . Sacroiliac joint disease 05/11/2015  . 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 Neuropathy due to secondary diabetes (HCC) 05/11/2015  . Essential hypertension 01/03/2015  . Coronary atherosclerosis of native coronary artery   . PAF (paroxysmal atrial fibrillation) (HCC) 10/20/2014  . Ischemic colitis (HCC) 10/20/2014  . Diabetes mellitus type 2 with complications (HCC) 10/20/2014  . COPD (chronic obstructive pulmonary disease) (HCC) 10/20/2014  . Hyperlipidemia 10/20/2014  . GERD (gastroesophageal reflux disease) 10/20/2014  . Ingrowing nail, right great toe 04/27/2014  . Internal hemorrhoid, bleeding 07/14/2013   This entire session was performed under direct supervision and direction of a licensed therapist/therapist assistant . I have personally read, edited and approve of the note as written. Stacey Drain, SPT Ezekiel Ina, PT, DPT  09/18/2017, 2:41 PM  [facility] MAIN [facility] SERVICES [address] [place], [place], [address] Phone: [phone]   Fax:  [phone]  Name: KYIA RHUDE MRN: [id number] Date of Birth: Mar 24, 1941

## 2017-09-23 ENCOUNTER — Ambulatory Visit (HOSPITAL_BASED_OUTPATIENT_CLINIC_OR_DEPARTMENT_OTHER): Payer: Medicare Other | Admitting: Physical Medicine & Rehabilitation

## 2017-09-23 ENCOUNTER — Encounter: Payer: Self-pay | Admitting: Physical Medicine & Rehabilitation

## 2017-09-23 ENCOUNTER — Encounter: Payer: Medicare Other | Attending: Physical Medicine & Rehabilitation

## 2017-09-23 VITALS — BP 160/79 | HR 62

## 2017-09-23 DIAGNOSIS — Z8249 Family history of ischemic heart disease and other diseases of the circulatory system: Secondary | ICD-10-CM | POA: Insufficient documentation

## 2017-09-23 DIAGNOSIS — I480 Paroxysmal atrial fibrillation: Secondary | ICD-10-CM | POA: Diagnosis not present

## 2017-09-23 DIAGNOSIS — Z86010 Personal history of colonic polyps: Secondary | ICD-10-CM | POA: Insufficient documentation

## 2017-09-23 DIAGNOSIS — I69398 Other sequelae of cerebral infarction: Secondary | ICD-10-CM | POA: Insufficient documentation

## 2017-09-23 DIAGNOSIS — Z90710 Acquired absence of both cervix and uterus: Secondary | ICD-10-CM | POA: Insufficient documentation

## 2017-09-23 DIAGNOSIS — E119 Type 2 diabetes mellitus without complications: Secondary | ICD-10-CM | POA: Insufficient documentation

## 2017-09-23 DIAGNOSIS — R4702 Dysphasia: Secondary | ICD-10-CM | POA: Diagnosis not present

## 2017-09-23 DIAGNOSIS — J449 Chronic obstructive pulmonary disease, unspecified: Secondary | ICD-10-CM | POA: Diagnosis not present

## 2017-09-23 DIAGNOSIS — R269 Unspecified abnormalities of gait and mobility: Secondary | ICD-10-CM

## 2017-09-23 DIAGNOSIS — I69320 Aphasia following cerebral infarction: Secondary | ICD-10-CM | POA: Insufficient documentation

## 2017-09-23 DIAGNOSIS — I119 Hypertensive heart disease without heart failure: Secondary | ICD-10-CM | POA: Diagnosis not present

## 2017-09-23 DIAGNOSIS — K589 Irritable bowel syndrome without diarrhea: Secondary | ICD-10-CM | POA: Diagnosis not present

## 2017-09-23 DIAGNOSIS — Z8719 Personal history of other diseases of the digestive system: Secondary | ICD-10-CM | POA: Insufficient documentation

## 2017-09-23 DIAGNOSIS — Z7982 Long term (current) use of aspirin: Secondary | ICD-10-CM | POA: Diagnosis not present

## 2017-09-23 DIAGNOSIS — K219 Gastro-esophageal reflux disease without esophagitis: Secondary | ICD-10-CM | POA: Diagnosis not present

## 2017-09-23 DIAGNOSIS — I2510 Atherosclerotic heart disease of native coronary artery without angina pectoris: Secondary | ICD-10-CM | POA: Diagnosis not present

## 2017-09-23 DIAGNOSIS — F802 Mixed receptive-expressive language disorder: Secondary | ICD-10-CM

## 2017-09-23 NOTE — Patient Instructions (Signed)
Ask Neurologist about resuming aspirin  May use tylenol no naproxen on ibuprofen

## 2017-09-23 NOTE — Progress Notes (Signed)
Subjective:  Transitional care visit, telephone call completed, discharged from rehabilitation on 09/12/2017  Patient ID: Yvette Collier, female    DOB: 01/05/1941, 76 y.o.   MRN: [id number] 76 year old right-handed female with history of hypertension, PAF, on no anticoagulation except baby aspirin due to history of GI bleed, diabetes mellitus.  Lives independent prior to admission, working as a Education officer, environmental.  Admitted September 01, 2017, with altered mental status, right-sided weakness, and aphasia.  Blood pressure elevated, systolic 175, started on nicardipine drip.  CT of the head showed large acute left temporal lobe hematoma, suspect hypertensive hemorrhage.  CT cervical spine negative.  Neurology consulted, advised conservative care.  Echocardiogram with ejection fraction of 65%, grade 1 diastolic dysfunction.  DATE OF ADMISSION:  09/04/2017 DATE OF DISCHARGE:  09/12/2017  HPI Seen by primary MD, Dr. Lawerance Bach Seeing back MD next week Seen by ENT, Follow-up on surgical removal of a mass on the left side of her neck. This has not recurred and the surgeon feels like the pain that she is currently experiencing is more arthritic  Daughter is asking what may have caused the stroke. Her blood pressure was reportedly under good control, I do not have the actual data for this. She had been taking naproxen prior to her stroke. We discussed that this could increase stroke risk, and that I would not recommend her using this for pain post stroke  Pain Inventory Average Pain 5 Pain Right Now 5 My pain is sharp  In the last 24 hours, has pain interfered with the following? General activity 5 Relation with others 5 Enjoyment of life 4 What TIME of day is your pain at its worst? morning Sleep (in general) Poor  Pain is worse with: walking, bending and standing Pain improves with: rest, heat/ice and medication Relief from Meds: 6  Mobility use a cane ability to climb steps?  no do you drive?   yes  Function disabled: date disabled . I need assistance with the following:  dressing, meal prep, household duties and shopping  Neuro/Psych bowel control problems weakness tingling trouble walking spasms dizziness confusion anxiety  Prior Studies Any changes since last visit?  no  Physicians involved in your care Any changes since last visit?  no   Family History  Problem Relation Age of Onset  . Heart attack Mother   . Hypertension Mother   . Heart disease Father   . Hypertension Father   . Hypertension Brother   . Hypertension Maternal Aunt    Social History   Social History  . Marital status: Divorced    Spouse name: N/A  . Number of children: N/A  . Years of education: N/A   Social History Main Topics  . Smoking status: Never Smoker  . Smokeless tobacco: Never Used  . Alcohol use No  . Drug use: No  . Sexual activity: Not on file   Other Topics Concern  . Not on file   Social History Narrative  . No narrative on file   Past Surgical History:  Procedure Laterality Date  . ABDOMINAL HYSTERECTOMY    . APPENDECTOMY    . BACK SURGERY     x 3, last 2004.  . CHOLECYSTECTOMY    . COLONOSCOPY WITH PROPOFOL N/A 03/20/2017   Procedure: COLONOSCOPY WITH PROPOFOL;  Surgeon: Wyline Mood, MD;  Location: [facility] ENDOSCOPY;  Service: Endoscopy;  Laterality: N/A;  . ESOPHAGOGASTRODUODENOSCOPY (EGD) WITH PROPOFOL N/A 03/20/2017   Procedure: ESOPHAGOGASTRODUODENOSCOPY (EGD) WITH PROPOFOL;  Surgeon: Wyline Mood,  MD;  Location: [facility] ENDOSCOPY;  Service: Endoscopy;  Laterality: N/A;  . FOOT SURGERY Right   . HEMORRHOID BANDING    . left total hip arthroplasty  2012  . STOMACH SURGERY    . TONSILLECTOMY     Past Medical History:  Diagnosis Date  . Asthma   . Chronic lower back pain   . COPD (chronic obstructive pulmonary disease) (HCC)   . Diabetes mellitus without complication (HCC)   . Gastrointestinal bleed   . GERD (gastroesophageal reflux disease)   .  History of colon polyps   . Hypertension   . Hypertensive heart disease   . IBS (irritable bowel syndrome)   . Ischemic colitis (HCC)   . Non-obstructive Coronary Artery Disease    a. 05/2009 Cath [facility]): minimal nonobs dzs.  . Osteoarthritis   . PAF (paroxysmal atrial fibrillation) (HCC)    a. 09/2014 during GI illness;  b. CHA2DS2VASc = 5 (not currently on OAC 2/2 h/o GIB/ischemic colitis); c. 09/2014 Echo: EF 60-65%, imparied relaxation, nl RV size/fxn.  . Transient cerebral ischemia due to atrial fibrillation (HCC)    There were no vitals taken for this visit.  Opioid Risk Score:   Fall Risk Score:  `1  Depression screen PHQ 2/9  Depression screen [facility] 2/9 08/14/2016 07/10/2016 06/18/2016 05/21/2016 04/25/2016 03/26/2016 02/28/2016  Decreased Interest 0 0 0 0 0 0 0  Down, Depressed, Hopeless 1 1 0 0 0 0 0  PHQ - 2 Score 1 1 0 0 0 0 0  Some recent data might be hidden     Review of Systems  Constitutional: Positive for unexpected weight change.  HENT: Negative.   Eyes: Negative.   Respiratory: Negative.   Gastrointestinal: Positive for abdominal pain, constipation, diarrhea and nausea.  Endocrine: Negative.   Genitourinary: Negative.   Musculoskeletal: Negative.   Skin: Negative.   Allergic/Immunologic: Negative.   Neurological: Negative.   Hematological: Negative.   Psychiatric/Behavioral: Negative.   All other systems reviewed and are negative.      Objective:   Physical Exam  Constitutional: She is oriented to person, place, and time. She appears well-developed and well-nourished. No distress.  HENT:  Head: Normocephalic and atraumatic.  Eyes: Pupils are equal, round, and reactive to light. Conjunctivae and EOM are normal.  Neck: No JVD present.  Cardiovascular: Normal rate, regular rhythm and normal heart sounds.   No murmur heard. Pulmonary/Chest: Effort normal and breath sounds normal. No stridor. No respiratory distress. She has no wheezes.  Abdominal: Soft. Bowel  sounds are normal. She exhibits no distension. There is no tenderness.  Musculoskeletal:  Pain with range of motion. Right greater than left knee, bilateral knee crepitus.  Neurological: She is alert and oriented to person, place, and time. Gait abnormal.  Patient is unable to name simple objects. She can follow simple one-step commands, such as touch her nose but not 2 step commands. She requires gestural cues during manual muscle testing. Her motor strength is 5/5 bilateral deltoid, bicep, triceps, grip, hip flexors, knee extensors, ankle dorsiflexors   Skin: Skin is warm and dry. She is not diaphoretic.  Psychiatric: She has a normal mood and affect.  Nursing note and vitals reviewed.         Assessment & Plan:  1. 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 Overall making good progress. I discussed with patient and family. The expected time course recovery period. Her speech. Recovery should start to plateau around 9-12 months post-CVA. She  will continue outpatient PT, OT and speech. I would expect that PT and OT will be discontinued sooner than speech. Needs follow-up with neurology , They will need to advise whether she can resume her aspirin for stroke prophylaxis given history of atrial fibrillation. Not a candidate for NOAC or warfarin due to history of GI bleeds, family prefers local neurologist and will call PCP for referral Follow-up with PCP for blood pressure management elevated systolic today. Follow-up with Dr. Metta Clines for her chronic pain issues Physical medicine and rehabilitation follow-up on an as-needed basis

## 2017-09-24 ENCOUNTER — Ambulatory Visit: Payer: Medicare Other | Admitting: Occupational Therapy

## 2017-09-24 ENCOUNTER — Ambulatory Visit: Payer: Medicare Other | Attending: Physical Medicine & Rehabilitation | Admitting: Speech Pathology

## 2017-09-24 ENCOUNTER — Encounter: Payer: Self-pay | Admitting: Speech Pathology

## 2017-09-24 ENCOUNTER — Ambulatory Visit: Payer: Medicare Other | Admitting: Physical Therapy

## 2017-09-24 ENCOUNTER — Encounter: Payer: Self-pay | Admitting: Occupational Therapy

## 2017-09-24 DIAGNOSIS — M6281 Muscle weakness (generalized): Secondary | ICD-10-CM

## 2017-09-24 DIAGNOSIS — I69315 Cognitive social or emotional deficit following cerebral infarction: Secondary | ICD-10-CM | POA: Diagnosis present

## 2017-09-24 DIAGNOSIS — I611 Nontraumatic intracerebral hemorrhage in hemisphere, cortical: Secondary | ICD-10-CM | POA: Diagnosis present

## 2017-09-24 DIAGNOSIS — R278 Other lack of coordination: Secondary | ICD-10-CM

## 2017-09-24 DIAGNOSIS — R4701 Aphasia: Secondary | ICD-10-CM | POA: Insufficient documentation

## 2017-09-24 DIAGNOSIS — R2681 Unsteadiness on feet: Secondary | ICD-10-CM | POA: Insufficient documentation

## 2017-09-24 NOTE — Therapy (Signed)
[facility] MAIN [facility] SERVICES [address] [place], [place], [address] Phone: [phone]   Fax:  [phone]  Occupational Therapy Treatment  Patient Details  Name: Yvette Collier MRN: [id number] Date of Birth: 05/27/1941 Referring Provider: Dr. Wynn Banker  Encounter Date: 09/24/2017      OT End of Session - 09/24/17 1321    Visit Number 3   Number of Visits 24   Date for OT Re-Evaluation 12/08/17   OT Start Time 1015   OT Stop Time 1100   OT Time Calculation (min) 45 min   Activity Tolerance Patient tolerated treatment well   Behavior During Therapy [facility] for tasks assessed/performed      Past Medical History:  Diagnosis Date  . Asthma   . Chronic lower back pain   . COPD (chronic obstructive pulmonary disease) (HCC)   . Diabetes mellitus without complication (HCC)   . Gastrointestinal bleed   . GERD (gastroesophageal reflux disease)   . History of colon polyps   . Hypertension   . Hypertensive heart disease   . IBS (irritable bowel syndrome)   . Ischemic colitis (HCC)   . Non-obstructive Coronary Artery Disease    a. 05/2009 Cath [facility]): minimal nonobs dzs.  . Osteoarthritis   . PAF (paroxysmal atrial fibrillation) (HCC)    a. 09/2014 during GI illness;  b. CHA2DS2VASc = 5 (not currently on OAC 2/2 h/o GIB/ischemic colitis); c. 09/2014 Echo: EF 60-65%, imparied relaxation, nl RV size/fxn.  . Transient cerebral ischemia due to atrial fibrillation [facility])     Past Surgical History:  Procedure Laterality Date  . ABDOMINAL HYSTERECTOMY    . APPENDECTOMY    . BACK SURGERY     x 3, last 2004.  . CHOLECYSTECTOMY    . COLONOSCOPY WITH PROPOFOL N/A 03/20/2017   Procedure: COLONOSCOPY WITH PROPOFOL;  Surgeon: Wyline Mood, MD;  Location[facility] ENDOSCOPY;  Service: Endoscopy;  Laterality: N/A;  . ESOPHAGOGASTRODUODENOSCOPY (EGD) WITH PROPOFOL N/A 03/20/2017   Procedure: ESOPHAGOGASTRODUODENOSCOPY (EGD) WITH PROPOFOL;  Surgeon: Wyline Mood, MD;   Location: [facility] ENDOSCOPY;  Service: Endoscopy;  Laterality: N/A;  . FOOT SURGERY Right   . HEMORRHOID BANDING    . left total hip arthroplasty  2012  . STOMACH SURGERY    . TONSILLECTOMY      There were no vitals filed for this visit.      Subjective Assessment - 09/24/17 1319    Subjective  Pt. reports she is doing well.   Pertinent History Pt. is a 76 y.o. female who suffered a Left temporla Lobe Hemorrhage secondary to Hypertensive Crisis, Moderate ICH, Left Frontal Trace SDH, and Traumatic ICH secondary to a fall. Pt. PMHX includes: HTN, DM with peripheral Neuropathy, Acute Kidney Injury, Hyperlipdidemia, and history of a GI Bleed. Pt. went the inpatient rehab at [facility], was discharged, and now is ready for outpatient rehab services.   Currently in Pain? No/denies   Pain Score 4    Pain Location Back   Pain Descriptors / Indicators Sore      OT TREATMENT     Neuro muscular re-education:  Pt. performed FMC tasks using the Grooved pegboard. Pt. worked on grasping the grooved pegs from a horizontal position, and moving the pegs to a vertical position in the hand to prepare for placing them in the grooved slot.   Therapeutic Exercise:  Pt. performed 2# dowel ex. For UE strengthening secondary to weakness. Bilateral shoulder flexion, chest press, circular patterns, and elbow  flexion/extension were performed.  Selfcare:  Pt. Worked on Programme researcher, broadcasting/film/video removing items from the dishwasher, and transporting items throughout the kitchen, and to keep the cane with her.                              OT Education - 09/24/17 1320    Education provided Yes   Education Details IADL kitchen   Person(s) Educated Patient   Methods Explanation   Comprehension Verbalized understanding;Returned demonstration            OT Long Term Goals - 09/15/17 1208      OT LONG TERM GOAL #1   Title Pt. will be independent with basic ADL care needs.    Baseline Eval: Pt. requires assist   Time 12   Period Weeks   Status New   Target Date 12/08/17     OT LONG TERM GOAL #2   Title Pt. will be independent with light meal preparation.   Baseline Eval: Pt. is unable   Time 12   Period Weeks   Status New   Target Date 12/08/17     OT LONG TERM GOAL #3   Title Pt. will be independent with tub/shower transfers   Baseline Eval: Pt. requires assist   Time 12   Period Weeks   Status New   Target Date 12/08/17     OT LONG TERM GOAL #4   Title Pt. will increase BUE strength by 2 mm grades to assist with ADLs.   Baseline Eval: BUE strength 4/5 overall   Time 12   Period Weeks   Status New   Target Date 12/08/17     OT LONG TERM GOAL #5   Title Pt. will improve right [facility] skills to be able to assist with buttoning, and zipping.    Baseline Eval: impaired   Time 12   Period Weeks   Status New   Target Date 12/08/17     OT LONG TERM GOAL #6   Title Pt. will improve right grip strength to be able to open containers.   Baseline Eval: Limited.   Time 12   Period Weeks   Target Date 12/08/17     OT LONG TERM GOAL #7   Title Pt. will identify potential safety hazards accurately during ADLs, and IADLs with 100% accuracy.   Baseline Eval: limited   Time 49   Status New   Target Date 12/08/17     OT LONG TERM GOAL #8   Title Pt. will demonstrate cognitive compensatory techniques 100% of the time with minimal cues.   Baseline Eval: limited   Time 12   Period Weeks   Status New   Target Date 12/08/17               Plan - 09/24/17 1321    Clinical Impression Statement Pt. reports having had alot of back pain last night, however reports that they put something on it to help it, and it felt much better. Pt. was able to access cabinetry, and appliance in the kitchen. Pt. requires cues not to leave the cane. Pt. was able to remove and transport dishes from the dishwasher to cabinetry with CGA. Pt. conitnues to work on improving  UE strength, and coordination skills for improved ADL, and IADL functioning.   Occupational Profile and client history currently impacting functional performance quires cues for safety as she attempts to leave her  Rehab Potential Good   OT Frequency 2x / week   OT Duration 12 weeks   OT Treatment/Interventions Self-care/ADL training;Therapeutic exercise;DME and/or AE instruction;Patient/family education;Therapeutic activities;Therapeutic exercises;Energy conservation   Consulted and Agree with Plan of Care Patient      Patient will benefit from skilled therapeutic intervention in order to improve the following deficits and impairments:  Abnormal gait, Decreased activity tolerance, Decreased cognition, Decreased balance, Decreased coordination, Decreased strength, Impaired UE functional use  Visit Diagnosis: Muscle weakness (generalized)  Other lack of coordination    Problem List Patient Active Problem List   Diagnosis Date Noted  . Gait disturbance, post-stroke 09/04/2017  . Wernicke's aphasia 09/04/2017  . 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 However, needs to rule out CAA, tumor or CVT (vein of labbe)  09/01/2017  . Constipation 01/06/2017  . DJD (degenerative joint disease) of knee 07/18/2016  . GIB (gastrointestinal bleeding) 05/25/2016  . GI bleed 05/24/2016  . Degenerative joint disease (DJD) of hip 05/08/2016  . 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 Hypertensive heart disease   . Hemorrhage of gastrointestinal tract 11/13/2015  . Lumbosacral facet joint syndrome 07/26/2015  . Angioedema 07/13/2015  . DM2 (diabetes mellitus, type 2) (HCC) 07/13/2015  . Status post lumbar laminectomy 06/19/2015  . Lumbar radiculopathy 06/19/2015  . DDD (degenerative disc disease), lumbar 05/11/2015  . Facet syndrome, lumbar 05/11/2015  . Lumbar post-laminectomy syndrome 05/11/2015  .  Sacroiliac joint disease 05/11/2015  . 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 Neuropathy due to secondary diabetes (HCC) 05/11/2015  . Essential hypertension 01/03/2015  . Coronary atherosclerosis of native coronary artery   . PAF (paroxysmal atrial fibrillation) (HCC) 10/20/2014  . Ischemic colitis (HCC) 10/20/2014  . Diabetes mellitus type 2 with complications (HCC) 10/20/2014  . COPD (chronic obstructive pulmonary disease) (HCC) 10/20/2014  . Hyperlipidemia 10/20/2014  . GERD (gastroesophageal reflux disease) 10/20/2014  . Ingrowing nail, right great toe 04/27/2014  . Internal hemorrhoid, bleeding 07/14/2013    Olegario Messier, MS, OTR/L 09/24/2017, 1:29 PM  [facility] [facility] MAIN [facility] SERVICES [address] [place], [place], [address] Phone: [phone]   Fax:  [phone]  Name: Yvette Collier MRN: [id number] Date of Birth: 1941-05-22

## 2017-09-24 NOTE — Therapy (Signed)
[place] [facility] MAIN [facility] SERVICES [address] [place], [place], [address] Phone: [phone]   Fax:  [phone]  Speech Language Pathology Treatment  Patient Details  Name: Yvette Collier MRN: [id number] Date of Birth: 1941-04-05 Referring Provider: Dr. Wynn Banker  Encounter Date: 09/24/2017      End of Session - 09/24/17 1339    Visit Number 2   Number of Visits 25   Date for SLP Re-Evaluation 12/15/17   SLP Start Time 1100   SLP Stop Time  1155   SLP Time Calculation (min) 55 min   Activity Tolerance Patient tolerated treatment well      Past Medical History:  Diagnosis Date  . Asthma   . Chronic lower back pain   . COPD (chronic obstructive pulmonary disease) (HCC)   . Diabetes mellitus without complication (HCC)   . Gastrointestinal bleed   . GERD (gastroesophageal reflux disease)   . History of colon polyps   . Hypertension   . Hypertensive heart disease   . IBS (irritable bowel syndrome)   . Ischemic colitis (HCC)   . Non-obstructive Coronary Artery Disease    a. 05/2009 Cath [facility]): minimal nonobs dzs.  . Osteoarthritis   . PAF (paroxysmal atrial fibrillation) (HCC)    a. 09/2014 during GI illness;  b. CHA2DS2VASc = 5 (not currently on OAC 2/2 h/o GIB/ischemic colitis); c. 09/2014 Echo: EF 60-65%, imparied relaxation, nl RV size/fxn.  . Transient cerebral ischemia due to atrial fibrillation [facility])     Past Surgical History:  Procedure Laterality Date  . ABDOMINAL HYSTERECTOMY    . APPENDECTOMY    . BACK SURGERY     x 3, last 2004.  . CHOLECYSTECTOMY    . COLONOSCOPY WITH PROPOFOL N/A 03/20/2017   Procedure: COLONOSCOPY WITH PROPOFOL;  Surgeon: Wyline Mood, MD;  Location: [facility] ENDOSCOPY;  Service: Endoscopy;  Laterality: N/A;  . ESOPHAGOGASTRODUODENOSCOPY (EGD) WITH PROPOFOL N/A 03/20/2017   Procedure: ESOPHAGOGASTRODUODENOSCOPY (EGD) WITH PROPOFOL;  Surgeon: Wyline Mood, MD;  Location: [facility] ENDOSCOPY;  Service: Endoscopy;   Laterality: N/A;  . FOOT SURGERY Right   . HEMORRHOID BANDING    . left total hip arthroplasty  2012  . STOMACH SURGERY    . TONSILLECTOMY      There were no vitals filed for this visit.      Subjective Assessment - 09/24/17 1337    Subjective Pt was pleasant and agreeable to treatment. Pt was able to express to clinician that she had an appointment following treatment today.   Currently in Pain? No/denies               ADULT SLP TREATMENT - 09/24/17 0001      General Information   Behavior/Cognition Alert;Cooperative;Pleasant mood;Requires cueing   HPI Patient is a 76 y/o woman with left temproal lobe hemorrhage on 09/01/17.     Treatment Provided   Treatment provided Cognitive-Linquistic     Pain Assessment   Pain Assessment No/denies pain     Cognitive-Linquistic Treatment   Treatment focused on Aphasia   Skilled Treatment Expressive Language: Pt named given objects with 10% acc spontaneously; 30% acc with phonemic prompt; and 90% acc w/verbal model. Pt demonstrated phonemic paraphasias, neologisms, and perseverations during naming tasks.  Attempted sentence completion with pictured objects, however pt only able to state word with verbal model from clinician. Pt completed simple sentences (given verbally) with verbal model of completed sentence w/40% acc w/just a gestural cue; 80% acc w/gestural cue and  phonemic cue, and 100% acc with model of missing word repeated. Auditory Comprehension:  Pt followed 2 unit body commands with 64% acc independently; 78% acc with prompt. Pt followed 2 unit with pictured objects with 50% acc independently and 100% acc w/gestural and/or verbal prompt.      Assessment / Recommendations / Plan   Plan Continue with current plan of care     Progression Toward Goals   Progression toward goals Progressing toward goals          SLP Education - 09/24/17 1338    Education provided Yes   Education Details Expressive language and auditory  comprehension tasks   Person(s) Educated Patient   Methods Explanation   Comprehension Verbalized understanding            SLP Long Term Goals - 09/15/17 1329      SLP LONG TERM GOAL #1   Title Patient will complete 2 unit processing tasks with 80% accuracy without the need of repetition of task instructions or significant delays in responding.   Time 8   Period Weeks   Status New   Target Date 11/15/17     SLP LONG TERM GOAL #2   Title Patient will complete 3 unit processing tasks with 80% accuracy without the need of repetition of task instructions or significant delays in responding.   Time 12   Period Weeks   Status New   Target Date 12/15/17     SLP LONG TERM GOAL #3   Title Patient will name common objects with 50% accuracy.   Time 8   Period Weeks   Status New   Target Date 11/15/17     SLP LONG TERM GOAL #4   Title Patient will name common objects with 80% accuracy.   Time 12   Period Weeks   Status New   Target Date 12/15/17     SLP LONG TERM GOAL #5   Title Patient will generate meaningful phrase to complete simple/concrete linguistic task with 80% accuracy.   Time 12   Period Weeks   Status New   Target Date 12/15/17          Plan - 09/24/17 1339    Clinical Impression Statement Pt demonstrated great enthusiasm for treatment today. Pt continues to present with severe expressive and receptive aphasia. Pt able to accurately produce some words with gestural  and verbal cues. Pt demonstrated improved auditory comprehension with verbal cues. Pt will continue to benefit from skilled speech therapy for restorative and compensatory treatment of aphasia.    Speech Therapy Frequency 2x / week   Duration Other (comment)  12 weeks   Treatment/Interventions Language facilitation;Multimodal communcation approach;SLP instruction and feedback;Patient/family education   Potential to Achieve Goals Good   Potential Considerations Ability to learn/carryover  information;Co-morbidities;Cooperation/participation level;Medical prognosis;Pain level;Previous level of function;Severity of impairments;Family/community support   SLP Home Exercise Plan To be determined depending on pt's ability to independently complete tasks   Consulted and Agree with Plan of Care Patient      Patient will benefit from skilled therapeutic intervention in order to improve the following deficits and impairments:   Aphasia    Problem List Patient Active Problem List   Diagnosis Date Noted  . Gait disturbance, post-stroke 09/04/2017  . Wernicke's aphasia 09/04/2017  . 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 However, needs to rule out CAA, tumor or CVT (  vein of labbe)  09/01/2017  . Constipation 01/06/2017  . DJD (degenerative joint disease) of knee 07/18/2016  . GIB (gastrointestinal bleeding) 05/25/2016  . GI bleed 05/24/2016  . Degenerative joint disease (DJD) of hip 05/08/2016  . 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 Hypertensive heart disease   . Hemorrhage of gastrointestinal tract 11/13/2015  . Lumbosacral facet joint syndrome 07/26/2015  . Angioedema 07/13/2015  . DM2 (diabetes mellitus, type 2) (HCC) 07/13/2015  . Status post lumbar laminectomy 06/19/2015  . Lumbar radiculopathy 06/19/2015  . DDD (degenerative disc disease), lumbar 05/11/2015  . Facet syndrome, lumbar 05/11/2015  . Lumbar post-laminectomy syndrome 05/11/2015  . Sacroiliac joint disease 05/11/2015  . 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 Neuropathy due to secondary diabetes (HCC) 05/11/2015  . Essential hypertension 01/03/2015  . Coronary atherosclerosis of native coronary artery   . PAF (paroxysmal atrial fibrillation) (HCC) 10/20/2014  . Ischemic colitis (HCC) 10/20/2014  . Diabetes mellitus type 2 with complications (HCC) 10/20/2014  . COPD (chronic  obstructive pulmonary disease) (HCC) 10/20/2014  . Hyperlipidemia 10/20/2014  . GERD (gastroesophageal reflux disease) 10/20/2014  . Ingrowing nail, right great toe 04/27/2014  . Internal hemorrhoid, bleeding 07/14/2013   Lauree Chandler, MA, CCC-SLP  Speech-Language Pathologist   [place] 09/24/2017, 1:44 PM  [place] [facility] MAIN [facility] SERVICES [address] [place], [place], [address] Phone: [phone]   Fax:  [phone]   Name: Yvette Collier MRN: [id number] Date of Birth: July 02, 1941

## 2017-09-25 NOTE — Therapy (Signed)
[facility] MAIN [facility] SERVICES [address] [place], [place], [address] Phone: [phone]   Fax:  [phone]  Patient Details  Name: Yvette Collier MRN: [id number] Date of Birth: Feb 09, 1941 Referring Provider:  Erick Colace, MD  Encounter Date: 09/24/2017 Patient was unable to stay for her visit due to fatigue  Ezekiel Ina, PT DPT 09/25/2017, 10:47 AM  [place] [facility] MAIN [facility] SERVICES [address] [place], [place], [address] Phone: [phone]   Fax:  [phone]

## 2017-09-26 ENCOUNTER — Ambulatory Visit: Payer: Medicare Other | Admitting: Occupational Therapy

## 2017-09-26 ENCOUNTER — Ambulatory Visit: Payer: Medicare Other

## 2017-10-01 ENCOUNTER — Ambulatory Visit: Payer: Medicare Other | Admitting: Physical Therapy

## 2017-10-01 ENCOUNTER — Encounter: Payer: Self-pay | Admitting: Speech Pathology

## 2017-10-01 ENCOUNTER — Ambulatory Visit: Payer: Medicare Other | Admitting: Occupational Therapy

## 2017-10-01 ENCOUNTER — Encounter: Payer: Self-pay | Admitting: Physical Therapy

## 2017-10-01 ENCOUNTER — Ambulatory Visit: Payer: Medicare Other | Admitting: Speech Pathology

## 2017-10-01 DIAGNOSIS — M6281 Muscle weakness (generalized): Secondary | ICD-10-CM

## 2017-10-01 DIAGNOSIS — R4701 Aphasia: Secondary | ICD-10-CM

## 2017-10-01 DIAGNOSIS — R2681 Unsteadiness on feet: Secondary | ICD-10-CM

## 2017-10-01 DIAGNOSIS — R278 Other lack of coordination: Secondary | ICD-10-CM

## 2017-10-01 NOTE — Therapy (Signed)
[place] [facility] MAIN [facility] SERVICES [address] [place], [place], [address] Phone: [phone]   Fax:  [phone]  Physical Therapy Treatment  Patient Details  Name: Yvette Collier MRN: [id number] Date of Birth: [birth year] Referring Provider: BURNS, HARRIETT P  Encounter Date: 10/01/2017      PT End of Session - 10/01/17 0917    Visit Number 3   Number of Visits 25   Date for PT Re-Evaluation 12/08/17   Authorization Type g code 3/10   PT Start Time 0912   PT Stop Time 1000   PT Time Calculation (min) 48 min   Equipment Utilized During Treatment Gait belt   Activity Tolerance Patient tolerated treatment well;Other (comment)  Easily distracted   Behavior During Therapy [facility] for tasks assessed/performed      Past Medical History:  Diagnosis Date  . Asthma   . Chronic lower back pain   . COPD (chronic obstructive pulmonary disease) (HCC)   . Diabetes mellitus without complication (HCC)   . Gastrointestinal bleed   . GERD (gastroesophageal reflux disease)   . History of colon polyps   . Hypertension   . Hypertensive heart disease   . IBS (irritable bowel syndrome)   . Ischemic colitis (HCC)   . Non-obstructive Coronary Artery Disease    a. 05/2009 Cath [facility]): minimal nonobs dzs.  . Osteoarthritis   . PAF (paroxysmal atrial fibrillation) (HCC)    a. 09/2014 during GI illness;  b. CHA2DS2VASc = 5 (not currently on OAC 2/2 h/o GIB/ischemic colitis); c. 09/2014 Echo: EF 60-65%, imparied relaxation, nl RV size/fxn.  . Transient cerebral ischemia due to atrial fibrillation [facility])     Past Surgical History:  Procedure Laterality Date  . ABDOMINAL HYSTERECTOMY    . APPENDECTOMY    . BACK SURGERY     x 3, last 2004.  . CHOLECYSTECTOMY    . COLONOSCOPY WITH PROPOFOL N/A 03/20/2017   Procedure: COLONOSCOPY WITH PROPOFOL;  Surgeon: Wyline Mood, MD;  Location: [facility] ENDOSCOPY;  Service: Endoscopy;  Laterality: N/A;  . ESOPHAGOGASTRODUODENOSCOPY  (EGD) WITH PROPOFOL N/A 03/20/2017   Procedure: ESOPHAGOGASTRODUODENOSCOPY (EGD) WITH PROPOFOL;  Surgeon: Wyline Mood, MD;  Location: [facility] ENDOSCOPY;  Service: Endoscopy;  Laterality: N/A;  . FOOT SURGERY Right   . HEMORRHOID BANDING    . left total hip arthroplasty  2012  . STOMACH SURGERY    . TONSILLECTOMY      There were no vitals filed for this visit.      Subjective Assessment - 10/01/17 0914    Subjective pt reports no pain today, states that she has her typical "aches and pain" with certain movements. Pt reports that she continues to do her HEP regularly.    Pertinent History Yvette Collier a 76 y.o.right handed femalewith history of hypertension,[name],diabetes mellitus, COPD. Patient lives alone independent prior to admission and works as a Education officer, environmental. One level home. Admitted 09/01/2017 with altered mental statusright side weaknessas well as aphasia. CT of the head showed a large acute left temporal lobe hematoma approximately 15.9 mL suspect hypertensive hemorrhage.Pt presents today with fluent aphasia.   Limitations Walking   How long can you sit comfortably? 30 minutes before needing to shift positions   How long can you stand comfortably? about 30 minutes before needing to sit and rest   How long can you walk comfortably? states that she has no issues with walking   Patient Stated Goals "get  stronger"   Currently in Pain? No/denies   Pain Score 0-No pain     Treatment:  Seated marches x 30 B LE    LAQ x 20 B LE   Supine marches x 20 B LE    SLR x 20 B LE's   Hooklying hip ABD/ER with red theraband x 20 B LE's   Bridges x 20    Sit to stand practice without use of UE's 3 x 5    Attempted walking in hall with headturns - unable to follow commands to consistently complete task   Diagonal card taps (high-low taps) standing on foam pad x 20 reps each direction   Boll toss standing on foam pad x 30 passes - one LOB  noted, ability to self-correct demonstrating improved postural reactions    Lateral steps from foam pad over 1/2 foam roller x 20 without UE support    Fwd and reverse steps from foam pads over 1/2 foam roller x 20 without UE support    Heel raises x 20 reps    Standing hip strength exercises:   Hip flex x 20 B LE   Hip ext x 20 B LE   Hip abd x 20 B LE  Pt requires frequent visual, verbal and tactile cues in order complete tasks today.  Difficulty following commands, easily forgets what exercise she was doing.                      PT Education - 10/01/17 0916    Education provided Yes   Education Details HEP continuation at home    Person(s) Educated Patient   Methods Explanation   Comprehension Verbalized understanding;Returned demonstration          PT Short Term Goals - 09/15/17 1213      PT SHORT TERM GOAL #1   Title Pt will improve 5x sit to stand to 16 seconds in order to demonstrate improved LE strength and improved dynamic balance to decrease risk for falls.   Baseline 18 sec   Time 6   Period Weeks   Status New   Target Date 10/27/17     PT SHORT TERM GOAL #2   Title Pt will improve gait speed to at least 0.8 m/s, demonstrating improved LE strength in order to safely ambulate with decreased risk for fals.   Baseline 0.688 m/s   Time 6   Period Weeks   Status New   Target Date 10/27/17     PT SHORT TERM GOAL #3   Title Pt will improve TUG to at least 16 seconds in order to demonstrate improve LE strength and dynamic balance.   Baseline 19 sec   Time 6   Period Weeks   Status New   Target Date 10/27/17     PT SHORT TERM GOAL #4   Title Pt will improve Berg Balance score to 46/56 in order to demonstarte improved dynamic and static balance in order to decrease overall falls risk.   Baseline 41/56   Time 6   Period Weeks   Status New   Target Date 10/27/17           PT Long Term Goals - 09/15/17 1216      PT LONG TERM GOAL #1    Title Pt will demonstrate independence with HEP in order to continue LE strengthening and balance interventions at home to manage symptoms and decrease overall falls risk.   Time 12  Period Weeks   Status New   Target Date 12/08/17     PT LONG TERM GOAL #2   Title Pt will improve 5x sit to stand to <15 seconds in order to demonstrate improved LE strength and improved balance in order to decrease falls risk.   Baseline 18 sec   Time 12   Period Weeks   Status New   Target Date 12/08/17     PT LONG TERM GOAL #3   Title Pt will improve gait speed to at least 1.0 m/s in order to demonstrate improved LE strength and balance in order to ambulate with decreased risk for falls.   Baseline 0.688 m/s   Time 12   Period Weeks   Status New   Target Date 12/08/17     PT LONG TERM GOAL #4   Title Pt will demonstrate an improved TUG score to < or equal to 14 seconds in order to demonstrate improved LE strength and balance in order to decrease overall falls risk.   Baseline 19 sec   Time 12   Period Weeks   Status New   Target Date 12/08/17     PT LONG TERM GOAL #5   Title Pt will demonstrate an improved Berg Balance score to at least 51/56 in order to demonstrate improved dynamic and static balance activities.   Baseline 41/56   Time 12   Period Weeks   Status New   Target Date 12/08/17     Additional Long Term Goals   Additional Long Term Goals Yes     PT LONG TERM GOAL #6   Title Pt will improve LE strength to 4+/5 in all limited planes in order to increase funtional abilities and improve gait.   Baseline Gross strength assessment: 3+/5, L hip ext 2+/5, R hip ext 3-/5   Time 12   Period Weeks   Status New   Target Date 12/08/17               Plan - 10/01/17 1610    Clinical Impression Statement Pt was able to perform all exercises today without increased fatigue or symptoms of pain or discomfort. Pt was able to perform all balance and strength exercises,  demonstrating improvements in LE strength and stability.  Pt was able to complete dynamic balance exercises, showing ability to reach outside BOS without LOB and improve postural reactions to correct self during activities.  Pt demonstrates decreased ability to follow commands and is easily distracted off task.  Pt requires verbal, visual and tactile cues during exercise in order to complete tasks with proper form and technique, as well as to stay on task.  Pt would continue to benefit from skilled PT services in order to further strengthen LE's, improve static and dynamic balance, and improve coordination in order to increase funtional mobility and decrease risk of falls.   Rehab Potential Good   PT Frequency 2x / week   PT Duration 12 weeks   PT Treatment/Interventions Aquatic Therapy;Cryotherapy;Moist Heat;Gait training;Stair training;Functional mobility training;Therapeutic activities;Therapeutic exercise;Balance training;Neuromuscular re-education;Patient/family education;Manual techniques;Passive range of motion   PT Next Visit Plan begin LE strength and dynamic balance activities   PT Home Exercise Plan seated marches, seated hip ABD with red theraband   Consulted and Agree with Plan of Care Patient      Patient will benefit from skilled therapeutic intervention in order to improve the following deficits and impairments:  Abnormal gait, Decreased coordination, Decreased activity tolerance, Decreased balance, Decreased cognition,  Decreased mobility, Decreased safety awareness, Decreased strength, Difficulty walking  Visit Diagnosis: Muscle weakness (generalized)  Other lack of coordination  Unsteadiness on feet     Problem List Patient Active Problem List   Diagnosis Date Noted  . Gait disturbance, post-stroke 09/04/2017  . Wernicke's aphasia 09/04/2017  . Left temporal lobe hemorrhage (HCC) -  left temporal moderate ICH and left frontal trace SDH, concerning for traumatic ICH due  to fall. However, needs to rule out CAA, tumor or CVT (vein of labbe)  09/01/2017  . Constipation 01/06/2017  . DJD (degenerative joint disease) of knee 07/18/2016  . GIB (gastrointestinal bleeding) 05/25/2016  . GI bleed 05/24/2016  . Degenerative joint disease (DJD) of hip 05/08/2016  . 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 Hypertensive heart disease   . Hemorrhage of gastrointestinal tract 11/13/2015  . Lumbosacral facet joint syndrome 07/26/2015  . Angioedema 07/13/2015  . DM2 (diabetes mellitus, type 2) (HCC) 07/13/2015  . Status post lumbar laminectomy 06/19/2015  . Lumbar radiculopathy 06/19/2015  . DDD (degenerative disc disease), lumbar 05/11/2015  . Facet syndrome, lumbar 05/11/2015  . Lumbar post-laminectomy syndrome 05/11/2015  . Sacroiliac joint disease 05/11/2015  . 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 Neuropathy due to secondary diabetes (HCC) 05/11/2015  . Essential hypertension 01/03/2015  . Coronary atherosclerosis of native coronary artery   . PAF (paroxysmal atrial fibrillation) (HCC) 10/20/2014  . Ischemic colitis (HCC) 10/20/2014  . Diabetes mellitus type 2 with complications (HCC) 10/20/2014  . COPD (chronic obstructive pulmonary disease) (HCC) 10/20/2014  . Hyperlipidemia 10/20/2014  . GERD (gastroesophageal reflux disease) 10/20/2014  . Ingrowing nail, right great toe 04/27/2014  . Internal hemorrhoid, bleeding 07/14/2013  This entire session was performed under direct supervision and direction of a licensed therapist/therapist assistant . I have personally read, edited and approve of the note as written. Stacey Drain, SPT Ezekiel Ina, PT, DPT  10/01/2017, 10:29 AM  [place] [facility] MAIN [facility] SERVICES [address] [place], [place], [address] Phone: [phone]   Fax:  [phone]  Name: Yvette Collier MRN: [id number] Date of Birth: February 28, 1941

## 2017-10-01 NOTE — Therapy (Signed)
[place] [facility] MAIN [facility] SERVICES [address] [place], [place], [address] Phone: [phone]   Fax:  [phone]  Occupational Therapy Treatment  Patient Details  Name: Yvette Collier MRN: [id number] Date of Birth: 09-28-1941 Referring Provider: Dr. Wynn Banker  Encounter Date: 10/01/2017      OT End of Session - 10/01/17 1310    Visit Number 4   Number of Visits 24   Date for OT Re-Evaluation 12/08/17   OT Start Time 1015   OT Stop Time 1100   OT Time Calculation (min) 45 min   Activity Tolerance Patient tolerated treatment well   Behavior During Therapy [facility] for tasks assessed/performed      Past Medical History:  Diagnosis Date  . Asthma   . Chronic lower back pain   . COPD (chronic obstructive pulmonary disease) (HCC)   . Diabetes mellitus without complication (HCC)   . Gastrointestinal bleed   . GERD (gastroesophageal reflux disease)   . History of colon polyps   . Hypertension   . Hypertensive heart disease   . IBS (irritable bowel syndrome)   . Ischemic colitis (HCC)   . Non-obstructive Coronary Artery Disease    a. 05/2009 Cath [facility]): minimal nonobs dzs.  . Osteoarthritis   . PAF (paroxysmal atrial fibrillation) (HCC)    a. 09/2014 during GI illness;  b. CHA2DS2VASc = 5 (not currently on OAC 2/2 h/o GIB/ischemic colitis); c. 09/2014 Echo: EF 60-65%, imparied relaxation, nl RV size/fxn.  . Transient cerebral ischemia due to atrial fibrillation [facility])     Past Surgical History:  Procedure Laterality Date  . ABDOMINAL HYSTERECTOMY    . APPENDECTOMY    . BACK SURGERY     x 3, last 2004.  . CHOLECYSTECTOMY    . COLONOSCOPY WITH PROPOFOL N/A 03/20/2017   Procedure: COLONOSCOPY WITH PROPOFOL;  Surgeon: Wyline Mood, MD;  Location: [facility] ENDOSCOPY;  Service: Endoscopy;  Laterality: N/A;  . ESOPHAGOGASTRODUODENOSCOPY (EGD) WITH PROPOFOL N/A 03/20/2017   Procedure: ESOPHAGOGASTRODUODENOSCOPY (EGD) WITH PROPOFOL;  Surgeon: Wyline Mood,  MD;  Location: [facility] ENDOSCOPY;  Service: Endoscopy;  Laterality: N/A;  . FOOT SURGERY Right   . HEMORRHOID BANDING    . left total hip arthroplasty  2012  . STOMACH SURGERY    . TONSILLECTOMY      There were no vitals filed for this visit.      Subjective Assessment - 10/01/17 1308    Subjective  Pt. reports she is feeling better.   Pertinent History Pt. is a 76 y.o. female who suffered a Left temporla Lobe Hemorrhage secondary to Hypertensive Crisis, Moderate ICH, Left Frontal Trace SDH, and Traumatic ICH secondary to a fall. Pt. PMHX includes: HTN, DM with peripheral Neuropathy, Acute Kidney Injury, Hyperlipdidemia, and history of a GI Bleed. Pt. went the inpatient rehab at [facility], was discharged, and now is ready for outpatient rehab services.   Currently in Pain? No/denies     OT TREATMENT    Therapeutic Exercise:  Pt. performed 2# dowel ex. For UE strengthening secondary to weakness. Bilateral shoulder flexion, chest press, circular patterns, and elbow flexion/extension were performed. 2# dumbbell ex. for elbow flexion and extension, forearm supination/pronation, wrist flexion/extension, and radial deviation. Pt. requires rest breaks and verbal cues for proper technique. Pt. Worked on pinch strengthening in the left hand for lateral, and 3pt. pinch using yellow, red, green, and blue resistive clips. Pt. worked on placing the clips at various vertical and horizontal angles. Tactile and verbal  cues were required for eliciting the desired movement. Consistent verbal cues, and visual demonstration for proper technique.                             OT Education - 10/01/17 1309    Education provided Yes   Education Details UE strengthening.   Person(s) Educated Patient   Methods Explanation   Comprehension Verbalized understanding             OT Long Term Goals - 09/15/17 1208      OT LONG TERM GOAL #1   Title Pt. will be independent with basic ADL  care needs.   Baseline Eval: Pt. requires assist   Time 12   Period Weeks   Status New   Target Date 12/08/17     OT LONG TERM GOAL #2   Title Pt. will be independent with light meal preparation.   Baseline Eval: Pt. is unable   Time 12   Period Weeks   Status New   Target Date 12/08/17     OT LONG TERM GOAL #3   Title Pt. will be independent with tub/shower transfers   Baseline Eval: Pt. requires assist   Time 12   Period Weeks   Status New   Target Date 12/08/17     OT LONG TERM GOAL #4   Title Pt. will increase BUE strength by 2 mm grades to assist with ADLs.   Baseline Eval: BUE strength 4/5 overall   Time 12   Period Weeks   Status New   Target Date 12/08/17     OT LONG TERM GOAL #5   Title Pt. will improve right [facility] skills to be able to assist with buttoning, and zipping.    Baseline Eval: impaired   Time 12   Period Weeks   Status New   Target Date 12/08/17     OT LONG TERM GOAL #6   Title Pt. will improve right grip strength to be able to open containers.   Baseline Eval: Limited.   Time 12   Period Weeks   Target Date 12/08/17     OT LONG TERM GOAL #7   Title Pt. will identify potential safety hazards accurately during ADLs, and IADLs with 100% accuracy.   Baseline Eval: limited   Time 84   Status New   Target Date 12/08/17     OT LONG TERM GOAL #8   Title Pt. will demonstrate cognitive compensatory techniques 100% of the time with minimal cues.   Baseline Eval: limited   Time 12   Period Weeks   Status New   Target Date 12/08/17               Plan - 10/01/17 1310    Clinical Impression Statement Pt. reports having no back pain today. Pt. continues to require cues for cognitive Assist. Pt. continues to work on improving UE strength, and coordination skills for improved functional UE use during ADLs, and IADLs.    Occupational performance deficits (Please refer to evaluation for details): ADL's;IADL's   Rehab Potential Good   OT  Frequency 2x / week   OT Duration 12 weeks   OT Treatment/Interventions Self-care/ADL training;Therapeutic exercise;DME and/or AE instruction;Patient/family education;Therapeutic activities;Therapeutic exercises;Energy conservation   Clinical Decision Making Several treatment options, min-mod task modification necessary   Consulted and Agree with Plan of Care Patient      Patient will benefit from skilled therapeutic  intervention in order to improve the following deficits and impairments:  Abnormal gait, Decreased activity tolerance, Decreased cognition, Decreased balance, Decreased coordination, Decreased strength, Impaired UE functional use  Visit Diagnosis: Muscle weakness (generalized)    Problem List Patient Active Problem List   Diagnosis Date Noted  . Gait disturbance, post-stroke 09/04/2017  . Wernicke's aphasia 09/04/2017  . 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 However, needs to rule out CAA, tumor or CVT (vein of labbe)  09/01/2017  . Constipation 01/06/2017  . DJD (degenerative joint disease) of knee 07/18/2016  . GIB (gastrointestinal bleeding) 05/25/2016  . GI bleed 05/24/2016  . Degenerative joint disease (DJD) of hip 05/08/2016  . 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 Hypertensive heart disease   . Hemorrhage of gastrointestinal tract 11/13/2015  . Lumbosacral facet joint syndrome 07/26/2015  . Angioedema 07/13/2015  . DM2 (diabetes mellitus, type 2) (HCC) 07/13/2015  . Status post lumbar laminectomy 06/19/2015  . Lumbar radiculopathy 06/19/2015  . DDD (degenerative disc disease), lumbar 05/11/2015  . Facet syndrome, lumbar 05/11/2015  . Lumbar post-laminectomy syndrome 05/11/2015  . Sacroiliac joint disease 05/11/2015  . 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 Neuropathy due to secondary diabetes (HCC) 05/11/2015  . Essential  hypertension 01/03/2015  . Coronary atherosclerosis of native coronary artery   . PAF (paroxysmal atrial fibrillation) (HCC) 10/20/2014  . Ischemic colitis (HCC) 10/20/2014  . Diabetes mellitus type 2 with complications (HCC) 10/20/2014  . COPD (chronic obstructive pulmonary disease) (HCC) 10/20/2014  . Hyperlipidemia 10/20/2014  . GERD (gastroesophageal reflux disease) 10/20/2014  . Ingrowing nail, right great toe 04/27/2014  . Internal hemorrhoid, bleeding 07/14/2013    Olegario Messier, MS, OTR/L 10/01/2017, 1:17 PM  [place] [facility] MAIN [facility] SERVICES [address] [place], [place], [address] Phone: [phone]   Fax:  [phone]  Name: Yvette Collier MRN: [id number] Date of Birth: 12/16/1941

## 2017-10-01 NOTE — Therapy (Signed)
[place] [facility] MAIN [facility] SERVICES [address] [place], [place], [address] Phone: [phone]   Fax:  [phone]  Speech Language Pathology Treatment  Patient Details  Name: Yvette Collier MRN: [id number] Date of Birth: 01-07-1941 Referring Provider: Dr. Wynn Banker  Encounter Date: 10/01/2017      End of Session - 10/01/17 1304    Visit Number 3   Number of Visits 25   Date for SLP Re-Evaluation 12/15/17   SLP Start Time 1100   SLP Stop Time  1158   SLP Time Calculation (min) 58 min   Activity Tolerance Patient tolerated treatment well      Past Medical History:  Diagnosis Date  . Asthma   . Chronic lower back pain   . COPD (chronic obstructive pulmonary disease) (HCC)   . Diabetes mellitus without complication (HCC)   . Gastrointestinal bleed   . GERD (gastroesophageal reflux disease)   . History of colon polyps   . Hypertension   . Hypertensive heart disease   . IBS (irritable bowel syndrome)   . Ischemic colitis (HCC)   . Non-obstructive Coronary Artery Disease    a. 05/2009 Cath [facility]): minimal nonobs dzs.  . Osteoarthritis   . PAF (paroxysmal atrial fibrillation) (HCC)    a. 09/2014 during GI illness;  b. CHA2DS2VASc = 5 (not currently on OAC 2/2 h/o GIB/ischemic colitis); c. 09/2014 Echo: EF 60-65%, imparied relaxation, nl RV size/fxn.  . Transient cerebral ischemia due to atrial fibrillation [facility])     Past Surgical History:  Procedure Laterality Date  . ABDOMINAL HYSTERECTOMY    . APPENDECTOMY    . BACK SURGERY     x 3, last 2004.  . CHOLECYSTECTOMY    . COLONOSCOPY WITH PROPOFOL N/A 03/20/2017   Procedure: COLONOSCOPY WITH PROPOFOL;  Surgeon: Wyline Mood, MD;  Location: [facility] ENDOSCOPY;  Service: Endoscopy;  Laterality: N/A;  . ESOPHAGOGASTRODUODENOSCOPY (EGD) WITH PROPOFOL N/A 03/20/2017   Procedure: ESOPHAGOGASTRODUODENOSCOPY (EGD) WITH PROPOFOL;  Surgeon: Wyline Mood, MD;  Location: [facility] ENDOSCOPY;  Service: Endoscopy;   Laterality: N/A;  . FOOT SURGERY Right   . HEMORRHOID BANDING    . left total hip arthroplasty  2012  . STOMACH SURGERY    . TONSILLECTOMY      There were no vitals filed for this visit.      Subjective Assessment - 10/01/17 1304    Subjective Patient pleasant and eager for therapy   Currently in Pain? No/denies               ADULT SLP TREATMENT - 10/01/17 0001      General Information   Behavior/Cognition Alert;Cooperative;Pleasant mood;Requires cueing   HPI Patient is a 76 y/o woman with left temproal lobe hemorrhage on 09/01/17.     Treatment Provided   Treatment provided Cognitive-Linquistic     Pain Assessment   Pain Assessment No/denies pain     Cognitive-Linquistic Treatment   Treatment focused on Aphasia   Skilled Treatment VERBAL EXPRESSION: The patient is not able to name pictured or actual objects "I've seen this before" "This is nice").  Patient able to read aloud 3-letter words with 60% accuracy, no cues.  Read aloud 5-letter words with 48% accuracy, no cues.     Assessment / Recommendations / Plan   Plan Continue with current plan of care     Progression Toward Goals   Progression toward goals Progressing toward goals          SLP Education -  10/01/17 1304    Education provided Yes   Education Details Look at the letters- read the word   Person(s) Educated Patient   Methods Explanation   Comprehension Verbalized understanding            SLP Long Term Goals - 09/15/17 1329      SLP LONG TERM GOAL #1   Title Patient will complete 2 unit processing tasks with 80% accuracy without the need of repetition of task instructions or significant delays in responding.   Time 8   Period Weeks   Status New   Target Date 11/15/17     SLP LONG TERM GOAL #2   Title Patient will complete 3 unit processing tasks with 80% accuracy without the need of repetition of task instructions or significant delays in responding.   Time 12   Period Weeks    Status New   Target Date 12/15/17     SLP LONG TERM GOAL #3   Title Patient will name common objects with 50% accuracy.   Time 8   Period Weeks   Status New   Target Date 11/15/17     SLP LONG TERM GOAL #4   Title Patient will name common objects with 80% accuracy.   Time 12   Period Weeks   Status New   Target Date 12/15/17     SLP LONG TERM GOAL #5   Title Patient will generate meaningful phrase to complete simple/concrete linguistic task with 80% accuracy.   Time 12   Period Weeks   Status New   Target Date 12/15/17          Plan - 10/01/17 1305    Clinical Impression Statement Patient able to read single words aloud with greater accuracy and independence than name objects.  Will continue reading fluency/comprehension in combination with verbal expression.   Speech Therapy Frequency 2x / week   Duration Other (comment)   Treatment/Interventions Language facilitation;Multimodal communcation approach;SLP instruction and feedback;Patient/family education   Potential to Achieve Goals Good   Potential Considerations Ability to learn/carryover information;Co-morbidities;Cooperation/participation level;Medical prognosis;Pain level;Previous level of function;Severity of impairments;Family/community support   SLP Home Exercise Plan To be determined depending on pt's ability to independently complete tasks   Consulted and Agree with Plan of Care Patient      Patient will benefit from skilled therapeutic intervention in order to improve the following deficits and impairments:   Aphasia    Problem List Patient Active Problem List   Diagnosis Date Noted  . Gait disturbance, post-stroke 09/04/2017  . Wernicke's aphasia 09/04/2017  . 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 However, needs to rule out CAA, tumor or CVT (vein of labbe)  09/01/2017  . Constipation 01/06/2017  . DJD  (degenerative joint disease) of knee 07/18/2016  . GIB (gastrointestinal bleeding) 05/25/2016  . GI bleed 05/24/2016  . Degenerative joint disease (DJD) of hip 05/08/2016  . 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 Hypertensive heart disease   . Hemorrhage of gastrointestinal tract 11/13/2015  . Lumbosacral facet joint syndrome 07/26/2015  . Angioedema 07/13/2015  . DM2 (diabetes mellitus, type 2) (HCC) 07/13/2015  . Status post lumbar laminectomy 06/19/2015  . Lumbar radiculopathy 06/19/2015  . DDD (degenerative disc disease), lumbar 05/11/2015  . Facet syndrome, lumbar 05/11/2015  . Lumbar post-laminectomy syndrome 05/11/2015  . Sacroiliac joint disease 05/11/2015  . 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  Neuropathy due to secondary diabetes (HCC) 05/11/2015  . Essential hypertension 01/03/2015  . Coronary atherosclerosis of native coronary artery   . PAF (paroxysmal atrial fibrillation) (HCC) 10/20/2014  . Ischemic colitis (HCC) 10/20/2014  . Diabetes mellitus type 2 with complications (HCC) 10/20/2014  . COPD (chronic obstructive pulmonary disease) (HCC) 10/20/2014  . Hyperlipidemia 10/20/2014  . GERD (gastroesophageal reflux disease) 10/20/2014  . Ingrowing nail, right great toe 04/27/2014  . Internal hemorrhoid, bleeding 07/14/2013   Dollene Primrose, MS/CCC- SLP  Leandrew Koyanagi 10/01/2017, 1:07 PM  [place] [facility] MAIN [facility] SERVICES [address] [place], [place], [address] Phone: [phone]   Fax:  [phone]   Name: MAYAR WHITTIER MRN: [id number] Date of Birth: 11-25-1941

## 2017-10-03 ENCOUNTER — Ambulatory Visit: Payer: Medicare Other

## 2017-10-03 ENCOUNTER — Ambulatory Visit: Payer: Medicare Other | Admitting: Speech Pathology

## 2017-10-03 ENCOUNTER — Ambulatory Visit: Payer: Medicare Other | Admitting: Occupational Therapy

## 2017-10-06 ENCOUNTER — Encounter: Payer: Self-pay | Admitting: Speech Pathology

## 2017-10-06 ENCOUNTER — Ambulatory Visit: Payer: Medicare Other | Admitting: Occupational Therapy

## 2017-10-06 ENCOUNTER — Ambulatory Visit: Payer: Medicare Other | Admitting: Speech Pathology

## 2017-10-06 DIAGNOSIS — M6281 Muscle weakness (generalized): Secondary | ICD-10-CM

## 2017-10-06 DIAGNOSIS — R4701 Aphasia: Secondary | ICD-10-CM

## 2017-10-06 NOTE — Therapy (Signed)
[place] [facility] MAIN [facility] SERVICES [address] [place], [place], [address] Phone: [phone]   Fax:  [phone]  Occupational Therapy Treatment  Patient Details  Name: Yvette Collier MRN: [id number] Date of Birth: 1941/02/09 Referring Provider: Dr. Wynn Banker  Encounter Date: 10/06/2017      OT End of Session - 10/06/17 1202    Visit Number 5   Number of Visits 24   Date for OT Re-Evaluation 12/08/17   OT Start Time 1015   OT Stop Time 1100   OT Time Calculation (min) 45 min   Activity Tolerance Patient tolerated treatment well   Behavior During Therapy [facility] for tasks assessed/performed      Past Medical History:  Diagnosis Date  . Asthma   . Chronic lower back pain   . COPD (chronic obstructive pulmonary disease) (HCC)   . Diabetes mellitus without complication (HCC)   . Gastrointestinal bleed   . GERD (gastroesophageal reflux disease)   . History of colon polyps   . Hypertension   . Hypertensive heart disease   . IBS (irritable bowel syndrome)   . Ischemic colitis (HCC)   . Non-obstructive Coronary Artery Disease    a. 05/2009 Cath [facility]): minimal nonobs dzs.  . Osteoarthritis   . PAF (paroxysmal atrial fibrillation) (HCC)    a. 09/2014 during GI illness;  b. CHA2DS2VASc = 5 (not currently on OAC 2/2 h/o GIB/ischemic colitis); c. 09/2014 Echo: EF 60-65%, imparied relaxation, nl RV size/fxn.  . Transient cerebral ischemia due to atrial fibrillation [facility])     Past Surgical History:  Procedure Laterality Date  . ABDOMINAL HYSTERECTOMY    . APPENDECTOMY    . BACK SURGERY     x 3, last 2004.  . CHOLECYSTECTOMY    . COLONOSCOPY WITH PROPOFOL N/A 03/20/2017   Procedure: COLONOSCOPY WITH PROPOFOL;  Surgeon: Wyline Mood, MD;  Location: [facility] ENDOSCOPY;  Service: Endoscopy;  Laterality: N/A;  . ESOPHAGOGASTRODUODENOSCOPY (EGD) WITH PROPOFOL N/A 03/20/2017   Procedure: ESOPHAGOGASTRODUODENOSCOPY (EGD) WITH PROPOFOL;  Surgeon: Wyline Mood,  MD;  Location: [facility] ENDOSCOPY;  Service: Endoscopy;  Laterality: N/A;  . FOOT SURGERY Right   . HEMORRHOID BANDING    . left total hip arthroplasty  2012  . STOMACH SURGERY    . TONSILLECTOMY      There were no vitals filed for this visit.      Subjective Assessment - 10/06/17 1156    Subjective  Pt. daughter reports pt. is having back/side pain today.   Pertinent History Pt. is a 76 y.o. female who suffered a Left temporla Lobe Hemorrhage secondary to Hypertensive Crisis, Moderate ICH, Left Frontal Trace SDH, and Traumatic ICH secondary to a fall. Pt. PMHX includes: HTN, DM with peripheral Neuropathy, Acute Kidney Injury, Hyperlipdidemia, and history of a GI Bleed. Pt. went the inpatient rehab at [facility], was discharged, and now is ready for outpatient rehab services.   Currently in Pain? Yes   Pain Score 6   Wng-Baker facial grimmace scale.   Pain Location Back  back radiating to left side   Pain Orientation Mid   Pain Descriptors / Indicators Sore;Aching   Pain Onset Today   Multiple Pain Sites No      OT TREATMENT    Selfcare:  Pt. Worked on simple sequencing tasks for common ADL, and IADL tasks. Pt. Required increased time, and extensive cues to complete.  OT Education - 10/06/17 1159    Education provided Yes   Education Details UE strengthening   Person(s) Educated Patient   Methods Explanation   Comprehension Verbalized understanding            OT Long Term Goals - 09/15/17 1208      OT LONG TERM GOAL #1   Title Pt. will be independent with basic ADL care needs.   Baseline Eval: Pt. requires assist   Time 12   Period Weeks   Status New   Target Date 12/08/17     OT LONG TERM GOAL #2   Title Pt. will be independent with light meal preparation.   Baseline Eval: Pt. is unable   Time 12   Period Weeks   Status New   Target Date 12/08/17     OT LONG TERM GOAL #3   Title Pt. will be independent  with tub/shower transfers   Baseline Eval: Pt. requires assist   Time 12   Period Weeks   Status New   Target Date 12/08/17     OT LONG TERM GOAL #4   Title Pt. will increase BUE strength by 2 mm grades to assist with ADLs.   Baseline Eval: BUE strength 4/5 overall   Time 12   Period Weeks   Status New   Target Date 12/08/17     OT LONG TERM GOAL #5   Title Pt. will improve right [facility] skills to be able to assist with buttoning, and zipping.    Baseline Eval: impaired   Time 12   Period Weeks   Status New   Target Date 12/08/17     OT LONG TERM GOAL #6   Title Pt. will improve right grip strength to be able to open containers.   Baseline Eval: Limited.   Time 12   Period Weeks   Target Date 12/08/17     OT LONG TERM GOAL #7   Title Pt. will identify potential safety hazards accurately during ADLs, and IADLs with 100% accuracy.   Baseline Eval: limited   Time 58   Status New   Target Date 12/08/17     OT LONG TERM GOAL #8   Title Pt. will demonstrate cognitive compensatory techniques 100% of the time with minimal cues.   Baseline Eval: limited   Time 12   Period Weeks   Status New   Target Date 12/08/17               Plan - 10/06/17 1202    Clinical Impression Statement Pt. reports having 6/10 back pain which radiates to the right side. Pt. requires extensive cues for simple sequencing tasks for ADL, and IADL tasks.     Occupational performance deficits (Please refer to evaluation for details): ADL's;IADL's   Rehab Potential Good   OT Frequency 2x / week   OT Duration 12 weeks   OT Treatment/Interventions Self-care/ADL training;Therapeutic exercise;DME and/or AE instruction;Patient/family education;Therapeutic activities;Therapeutic exercises;Energy conservation   Consulted and Agree with Plan of Care Patient      Patient will benefit from skilled therapeutic intervention in order to improve the following deficits and impairments:  Abnormal gait,  Decreased activity tolerance, Decreased cognition, Decreased balance, Decreased coordination, Decreased strength, Impaired UE functional use  Visit Diagnosis: Muscle weakness (generalized)    Problem List Patient Active Problem List   Diagnosis Date Noted  . Gait disturbance, post-stroke 09/04/2017  . Wernicke's aphasia 09/04/2017  . 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 However, needs to rule out CAA, tumor or CVT (vein of labbe)  09/01/2017  . Constipation 01/06/2017  . DJD (degenerative joint disease) of knee 07/18/2016  . GIB (gastrointestinal bleeding) 05/25/2016  . GI bleed 05/24/2016  . Degenerative joint disease (DJD) of hip 05/08/2016  . 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 Hypertensive heart disease   . Hemorrhage of gastrointestinal tract 11/13/2015  . Lumbosacral facet joint syndrome 07/26/2015  . Angioedema 07/13/2015  . DM2 (diabetes mellitus, type 2) (HCC) 07/13/2015  . Status post lumbar laminectomy 06/19/2015  . Lumbar radiculopathy 06/19/2015  . DDD (degenerative disc disease), lumbar 05/11/2015  . Facet syndrome, lumbar 05/11/2015  . Lumbar post-laminectomy syndrome 05/11/2015  . Sacroiliac joint disease 05/11/2015  . 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 Neuropathy due to secondary diabetes (HCC) 05/11/2015  . Essential hypertension 01/03/2015  . Coronary atherosclerosis of native coronary artery   . PAF (paroxysmal atrial fibrillation) (HCC) 10/20/2014  . Ischemic colitis (HCC) 10/20/2014  . Diabetes mellitus type 2 with complications (HCC) 10/20/2014  . COPD (chronic obstructive pulmonary disease) (HCC) 10/20/2014  . Hyperlipidemia 10/20/2014  . GERD (gastroesophageal reflux disease) 10/20/2014  . Ingrowing nail, right great toe 04/27/2014  . Internal hemorrhoid, bleeding 07/14/2013    Olegario Messier, MS,  OTR/L 10/06/2017, 12:10 PM  [place] [facility] MAIN [facility] SERVICES [address] [place], [place], [address] Phone: [phone]   Fax:  [phone]  Name: Yvette Collier MRN: [id number] Date of Birth: 1941-08-09

## 2017-10-06 NOTE — Therapy (Signed)
[place] [facility] MAIN [facility] SERVICES [address] [place], [place], [address] Phone: [phone]   Fax:  [phone]  Speech Language Pathology Treatment  Patient Details  Name: Yvette Collier MRN: [id number] Date of Birth: 11-02-1941 Referring Provider: Dr. Wynn Banker  Encounter Date: 10/06/2017      End of Session - 10/06/17 1321    Visit Number 4   Number of Visits 25   Date for SLP Re-Evaluation 12/15/17   SLP Start Time 1010   SLP Stop Time  1100   SLP Time Calculation (min) 50 min   Activity Tolerance Patient tolerated treatment well      Past Medical History:  Diagnosis Date  . Asthma   . Chronic lower back pain   . COPD (chronic obstructive pulmonary disease) (HCC)   . Diabetes mellitus without complication (HCC)   . Gastrointestinal bleed   . GERD (gastroesophageal reflux disease)   . History of colon polyps   . Hypertension   . Hypertensive heart disease   . IBS (irritable bowel syndrome)   . Ischemic colitis (HCC)   . Non-obstructive Coronary Artery Disease    a. 05/2009 Cath [facility]): minimal nonobs dzs.  . Osteoarthritis   . PAF (paroxysmal atrial fibrillation) (HCC)    a. 09/2014 during GI illness;  b. CHA2DS2VASc = 5 (not currently on OAC 2/2 h/o GIB/ischemic colitis); c. 09/2014 Echo: EF 60-65%, imparied relaxation, nl RV size/fxn.  . Transient cerebral ischemia due to atrial fibrillation [facility])     Past Surgical History:  Procedure Laterality Date  . ABDOMINAL HYSTERECTOMY    . APPENDECTOMY    . BACK SURGERY     x 3, last 2004.  . CHOLECYSTECTOMY    . COLONOSCOPY WITH PROPOFOL N/A 03/20/2017   Procedure: COLONOSCOPY WITH PROPOFOL;  Surgeon: Wyline Mood, MD;  Location: [facility] ENDOSCOPY;  Service: Endoscopy;  Laterality: N/A;  . ESOPHAGOGASTRODUODENOSCOPY (EGD) WITH PROPOFOL N/A 03/20/2017   Procedure: ESOPHAGOGASTRODUODENOSCOPY (EGD) WITH PROPOFOL;  Surgeon: Wyline Mood, MD;  Location: [facility] ENDOSCOPY;  Service: Endoscopy;   Laterality: N/A;  . FOOT SURGERY Right   . HEMORRHOID BANDING    . left total hip arthroplasty  2012  . STOMACH SURGERY    . TONSILLECTOMY      There were no vitals filed for this visit.      Subjective Assessment - 10/06/17 1320    Subjective Patient pleasant and eager for therapy   Currently in Pain? Yes               ADULT SLP TREATMENT - 10/06/17 0001      General Information   Behavior/Cognition Alert;Cooperative;Pleasant mood;Requires cueing   HPI Patient is a 76 y/o woman with left temproal lobe hemorrhage on 09/01/17.     Treatment Provided   Treatment provided Cognitive-Linquistic     Pain Assessment   Pain Assessment No/denies pain     Cognitive-Linquistic Treatment   Treatment focused on Aphasia   Skilled Treatment VERBAL EXPRESSION: The patient is not able to name pictured or actual objects ("I've seen this before" "This is nice").  Patient able to read aloud 3-letter words with 60% accuracy, no cues.  COMPREHENSION: Match written word to pictured object (f=10) with 40% accuracy, no cues.  Identify (f=10) spoken word to pictured object/written word with 80% accuracy.  Follow 1-step motor commands with 60% accuracy.     Assessment / Recommendations / Plan   Plan Continue with current plan of care  Progression Toward Goals   Progression toward goals Progressing toward goals          SLP Education - 10/06/17 1320    Education provided Yes   Education Details Patient is not speaking "real words"   Person(s) Educated Patient   Methods Explanation   Comprehension Need further instruction            SLP Long Term Goals - 09/15/17 1329      SLP LONG TERM GOAL #1   Title Patient will complete 2 unit processing tasks with 80% accuracy without the need of repetition of task instructions or significant delays in responding.   Time 8   Period Weeks   Status New   Target Date 11/15/17     SLP LONG TERM GOAL #2   Title Patient will complete 3  unit processing tasks with 80% accuracy without the need of repetition of task instructions or significant delays in responding.   Time 12   Period Weeks   Status New   Target Date 12/15/17     SLP LONG TERM GOAL #3   Title Patient will name common objects with 50% accuracy.   Time 8   Period Weeks   Status New   Target Date 11/15/17     SLP LONG TERM GOAL #4   Title Patient will name common objects with 80% accuracy.   Time 12   Period Weeks   Status New   Target Date 12/15/17     SLP LONG TERM GOAL #5   Title Patient will generate meaningful phrase to complete simple/concrete linguistic task with 80% accuracy.   Time 12   Period Weeks   Status New   Target Date 12/15/17          Plan - 10/06/17 1322    Clinical Impression Statement Patient able to read single words aloud with greater accuracy and independence than name objects.  Will continue reading fluency/comprehension in combination with verbal expression.   Speech Therapy Frequency 2x / week   Duration Other (comment)   Treatment/Interventions Language facilitation;Multimodal communcation approach;SLP instruction and feedback;Patient/family education   Potential to Achieve Goals Good   Potential Considerations Ability to learn/carryover information;Co-morbidities;Cooperation/participation level;Medical prognosis;Pain level;Previous level of function;Severity of impairments;Family/community support   SLP Home Exercise Plan To be determined depending on pt's ability to independently complete tasks   Consulted and Agree with Plan of Care Patient      Patient will benefit from skilled therapeutic intervention in order to improve the following deficits and impairments:   Aphasia    Problem List Patient Active Problem List   Diagnosis Date Noted  . Gait disturbance, post-stroke 09/04/2017  . Wernicke's aphasia 09/04/2017  . Left temporal lobe hemorrhage (HCC) -  left temporal moderate ICH and left frontal trace  SDH, concerning for traumatic ICH due to fall. However, needs to rule out CAA, tumor or CVT (vein of labbe)  09/01/2017  . Constipation 01/06/2017  . DJD (degenerative joint disease) of knee 07/18/2016  . GIB (gastrointestinal bleeding) 05/25/2016  . GI bleed 05/24/2016  . Degenerative joint disease (DJD) of hip 05/08/2016  . 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 Hypertensive heart disease   . Hemorrhage of gastrointestinal tract 11/13/2015  . Lumbosacral facet joint syndrome 07/26/2015  . Angioedema 07/13/2015  . DM2 (diabetes mellitus, type 2) (HCC) 07/13/2015  . Status post lumbar laminectomy 06/19/2015  . Lumbar radiculopathy 06/19/2015  . DDD (degenerative disc disease), lumbar 05/11/2015  . Facet syndrome, lumbar  05/11/2015  . Lumbar post-laminectomy syndrome 05/11/2015  . Sacroiliac joint disease 05/11/2015  . 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 Neuropathy due to secondary diabetes (HCC) 05/11/2015  . Essential hypertension 01/03/2015  . Coronary atherosclerosis of native coronary artery   . PAF (paroxysmal atrial fibrillation) (HCC) 10/20/2014  . Ischemic colitis (HCC) 10/20/2014  . Diabetes mellitus type 2 with complications (HCC) 10/20/2014  . COPD (chronic obstructive pulmonary disease) (HCC) 10/20/2014  . Hyperlipidemia 10/20/2014  . GERD (gastroesophageal reflux disease) 10/20/2014  . Ingrowing nail, right great toe 04/27/2014  . Internal hemorrhoid, bleeding 07/14/2013   Dollene Primrose, MS/CCC- SLP  Leandrew Koyanagi 10/06/2017, 1:23 PM  [place] [facility] MAIN [facility] SERVICES [address] [place], [place], [address] Phone: [phone]   Fax:  [phone]   Name: Yvette Collier MRN: [id number] Date of Birth: 1941/05/27

## 2017-10-08 ENCOUNTER — Ambulatory Visit: Payer: Medicare Other | Admitting: Physical Therapy

## 2017-10-08 ENCOUNTER — Ambulatory Visit: Payer: Medicare Other | Admitting: Occupational Therapy

## 2017-10-10 ENCOUNTER — Ambulatory Visit: Payer: Medicare Other | Admitting: Occupational Therapy

## 2017-10-10 ENCOUNTER — Ambulatory Visit: Payer: Medicare Other | Admitting: Physical Therapy

## 2017-10-10 VITALS — BP 154/74 | HR 67

## 2017-10-10 DIAGNOSIS — M6281 Muscle weakness (generalized): Secondary | ICD-10-CM

## 2017-10-10 DIAGNOSIS — R2681 Unsteadiness on feet: Secondary | ICD-10-CM

## 2017-10-10 DIAGNOSIS — I69315 Cognitive social or emotional deficit following cerebral infarction: Secondary | ICD-10-CM

## 2017-10-10 DIAGNOSIS — R4701 Aphasia: Secondary | ICD-10-CM | POA: Diagnosis not present

## 2017-10-10 DIAGNOSIS — R278 Other lack of coordination: Secondary | ICD-10-CM

## 2017-10-10 NOTE — Therapy (Signed)
[place] [facility] MAIN [facility] SERVICES [address] [place], [place], [address] Phone: [phone]   Fax:  [phone]  Physical Therapy Treatment  Patient Details  Name: Yvette Collier MRN: [id number] Date of Birth: [birth year] Referring Provider: BURNS, HARRIETT P  Encounter Date: 10/10/2017      PT End of Session - 10/10/17 1009    Visit Number 4   Number of Visits 25   Date for PT Re-Evaluation 12/08/17   Authorization Type g code 4/10   PT Start Time 1007   PT Stop Time 1046   PT Time Calculation (min) 39 min   Equipment Utilized During Treatment Gait belt   Activity Tolerance Patient tolerated treatment well;Other (comment)  Easily distracted   Behavior During Therapy [facility] for tasks assessed/performed      Past Medical History:  Diagnosis Date  . Asthma   . Chronic lower back pain   . COPD (chronic obstructive pulmonary disease) (HCC)   . Diabetes mellitus without complication (HCC)   . Gastrointestinal bleed   . GERD (gastroesophageal reflux disease)   . History of colon polyps   . Hypertension   . Hypertensive heart disease   . IBS (irritable bowel syndrome)   . Ischemic colitis (HCC)   . Non-obstructive Coronary Artery Disease    a. 05/2009 Cath [facility]): minimal nonobs dzs.  . Osteoarthritis   . PAF (paroxysmal atrial fibrillation) (HCC)    a. 09/2014 during GI illness;  b. CHA2DS2VASc = 5 (not currently on OAC 2/2 h/o GIB/ischemic colitis); c. 09/2014 Echo: EF 60-65%, imparied relaxation, nl RV size/fxn.  . Transient cerebral ischemia due to atrial fibrillation [facility])     Past Surgical History:  Procedure Laterality Date  . ABDOMINAL HYSTERECTOMY    . APPENDECTOMY    . BACK SURGERY     x 3, last 2004.  . CHOLECYSTECTOMY    . COLONOSCOPY WITH PROPOFOL N/A 03/20/2017   Procedure: COLONOSCOPY WITH PROPOFOL;  Surgeon: Wyline Mood, MD;  Location[facility] ENDOSCOPY;  Service: Endoscopy;  Laterality: N/A;  . ESOPHAGOGASTRODUODENOSCOPY  (EGD) WITH PROPOFOL N/A 03/20/2017   Procedure: ESOPHAGOGASTRODUODENOSCOPY (EGD) WITH PROPOFOL;  Surgeon: Wyline Mood, MD;  Location: [facility] ENDOSCOPY;  Service: Endoscopy;  Laterality: N/A;  . FOOT SURGERY Right   . HEMORRHOID BANDING    . left total hip arthroplasty  2012  . STOMACH SURGERY    . TONSILLECTOMY      Vitals:   10/10/17 1012  BP: (!) 154/74  Pulse: 67  SpO2: 99%        Subjective Assessment - 10/10/17 1012    Subjective Pt reports no new concerns or complaints.  She reports 2-3/10 back pain which is her baseline.     Pertinent History Yvette Collier a 76 y.o.right handed femalewith history of hypertension,[name],diabetes mellitus, COPD. Patient lives alone independent prior to admission and works as a Education officer, environmental. One level home. Admitted 09/01/2017 with altered mental statusright side weaknessas well as aphasia. CT of the head showed a large acute left temporal lobe hematoma approximately 15.9 mL suspect hypertensive hemorrhage.Pt presents today with fluent aphasia.   Limitations Walking   How long can you sit comfortably? 30 minutes before needing to shift positions   How long can you stand comfortably? about 30 minutes before needing to sit and rest   How long can you walk comfortably? states that she has no issues with walking   Patient Stated Goals "get  stronger"   Currently in Pain? Yes   Pain Score 3    Pain Location Back   Pain Orientation Lower   Pain Descriptors / Indicators Aching   Pain Type Chronic pain   Pain Onset More than a month ago   Multiple Pain Sites No       TREATMENT   Seated marches x 30 B LE with 3# ankle weights   LAQ x 20 B LE with 3# ankle weights   SLR x 20 B LE with 3# ankle weight on RLE and no weight on LLE as pt reports pain in L hip/knee with weight.   Bridges x 20   Sit to stand practice without use of UE's x 5. Repeated x15 with L foot on 6" step to encourage greater use of  RLE.   Taps to balance stones standing on foam pad x 20 reps each LE. Pt taps incorrect balance stone despite max verbal, visual, and tactile cues.   Boll toss standing on foam pad to L x 15 passes and to R x15 passes with no LOB.   Ambulating in // bars focusing on incorporating arm swing and complete weight shift to BLE. Verbal and tactile cues and facilitation provided to assist. Pt continues to have difficulty following commands and requires HHA to complete proper arm swing.   Pt requires frequent visual, verbal and tactile cues in order to complete tasks today. Difficulty following commands, easily forgets what exercise she was doing.            PT Education - 10/10/17 1009    Education provided Yes   Education Details Exercise technique   Person(s) Educated Patient   Methods Explanation;Demonstration;Verbal cues;Tactile cues;Other (comment)  using gestures   Comprehension Verbalized understanding;Returned demonstration;Verbal cues required;Tactile cues required;Need further instruction          PT Short Term Goals - 09/15/17 1213      PT SHORT TERM GOAL #1   Title Pt will improve 5x sit to stand to 16 seconds in order to demonstrate improved LE strength and improved dynamic balance to decrease risk for falls.   Baseline 18 sec   Time 6   Period Weeks   Status New   Target Date 10/27/17     PT SHORT TERM GOAL #2   Title Pt will improve gait speed to at least 0.8 m/s, demonstrating improved LE strength in order to safely ambulate with decreased risk for fals.   Baseline 0.688 m/s   Time 6   Period Weeks   Status New   Target Date 10/27/17     PT SHORT TERM GOAL #3   Title Pt will improve TUG to at least 16 seconds in order to demonstrate improve LE strength and dynamic balance.   Baseline 19 sec   Time 6   Period Weeks   Status New   Target Date 10/27/17     PT SHORT TERM GOAL #4   Title Pt will improve Berg Balance score to 46/56 in order to demonstarte  improved dynamic and static balance in order to decrease overall falls risk.   Baseline 41/56   Time 6   Period Weeks   Status New   Target Date 10/27/17           PT Long Term Goals - 09/15/17 1216      PT LONG TERM GOAL #1   Title Pt will demonstrate independence with HEP in order to continue LE strengthening and balance  interventions at home to manage symptoms and decrease overall falls risk.   Time 12   Period Weeks   Status New   Target Date 12/08/17     PT LONG TERM GOAL #2   Title Pt will improve 5x sit to stand to <15 seconds in order to demonstrate improved LE strength and improved balance in order to decrease falls risk.   Baseline 18 sec   Time 12   Period Weeks   Status New   Target Date 12/08/17     PT LONG TERM GOAL #3   Title Pt will improve gait speed to at least 1.0 m/s in order to demonstrate improved LE strength and balance in order to ambulate with decreased risk for falls.   Baseline 0.688 m/s   Time 12   Period Weeks   Status New   Target Date 12/08/17     PT LONG TERM GOAL #4   Title Pt will demonstrate an improved TUG score to < or equal to 14 seconds in order to demonstrate improved LE strength and balance in order to decrease overall falls risk.   Baseline 19 sec   Time 12   Period Weeks   Status New   Target Date 12/08/17     PT LONG TERM GOAL #5   Title Pt will demonstrate an improved Berg Balance score to at least 51/56 in order to demonstrate improved dynamic and static balance activities.   Baseline 41/56   Time 12   Period Weeks   Status New   Target Date 12/08/17     Additional Long Term Goals   Additional Long Term Goals Yes     PT LONG TERM GOAL #6   Title Pt will improve LE strength to 4+/5 in all limited planes in order to increase funtional abilities and improve gait.   Baseline Gross strength assessment: 3+/5, L hip ext 2+/5, R hip ext 3-/5   Time 12   Period Weeks   Status New   Target Date 12/08/17                Plan - 10/10/17 1049    Clinical Impression Statement Pt tolerated all interventions well. She reported and demonstrated fatigue with progression of strengthening exercises and with bridges.  She requires max verbal, tactile, and visual cues, along with demonstration to properly complete exercises today.  Despite cues she still has difficulty.  Introduced gait training exercise to facilitate complete weight shift BLE during stance phase, emphasing arm swing with assist to complete properly.  She will benefit from continued skilled PT interventions for improved strength, gait mechanics, and QOL.    Rehab Potential Good   PT Frequency 2x / week   PT Duration 12 weeks   PT Treatment/Interventions Aquatic Therapy;Cryotherapy;Moist Heat;Gait training;Stair training;Functional mobility training;Therapeutic activities;Therapeutic exercise;Balance training;Neuromuscular re-education;Patient/family education;Manual techniques;Passive range of motion   PT Next Visit Plan begin LE strength and dynamic balance activities   PT Home Exercise Plan seated marches, seated hip ABD with red theraband   Consulted and Agree with Plan of Care Patient      Patient will benefit from skilled therapeutic intervention in order to improve the following deficits and impairments:  Abnormal gait, Decreased coordination, Decreased activity tolerance, Decreased balance, Decreased cognition, Decreased mobility, Decreased safety awareness, Decreased strength, Difficulty walking  Visit Diagnosis: Muscle weakness (generalized)  Other lack of coordination  Unsteadiness on feet     Problem List Patient Active Problem List   Diagnosis Date  Noted  . Gait disturbance, post-stroke 09/04/2017  . Wernicke's aphasia 09/04/2017  . 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 However, needs to rule out CAA, tumor or CVT  (vein of labbe)  09/01/2017  . Constipation 01/06/2017  . DJD (degenerative joint disease) of knee 07/18/2016  . GIB (gastrointestinal bleeding) 05/25/2016  . GI bleed 05/24/2016  . Degenerative joint disease (DJD) of hip 05/08/2016  . 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 Hypertensive heart disease   . Hemorrhage of gastrointestinal tract 11/13/2015  . Lumbosacral facet joint syndrome 07/26/2015  . Angioedema 07/13/2015  . DM2 (diabetes mellitus, type 2) (HCC) 07/13/2015  . Status post lumbar laminectomy 06/19/2015  . Lumbar radiculopathy 06/19/2015  . DDD (degenerative disc disease), lumbar 05/11/2015  . Facet syndrome, lumbar 05/11/2015  . Lumbar post-laminectomy syndrome 05/11/2015  . Sacroiliac joint disease 05/11/2015  . 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 Neuropathy due to secondary diabetes (HCC) 05/11/2015  . Essential hypertension 01/03/2015  . Coronary atherosclerosis of native coronary artery   . PAF (paroxysmal atrial fibrillation) (HCC) 10/20/2014  . Ischemic colitis (HCC) 10/20/2014  . Diabetes mellitus type 2 with complications (HCC) 10/20/2014  . COPD (chronic obstructive pulmonary disease) (HCC) 10/20/2014  . Hyperlipidemia 10/20/2014  . GERD (gastroesophageal reflux disease) 10/20/2014  . Ingrowing nail, right great toe 04/27/2014  . Internal hemorrhoid, bleeding 07/14/2013    Encarnacion Chu PT, DPT 10/10/2017, 10:52 AM  [place] [facility] MAIN [facility] SERVICES [address] [place], [place], [address] Phone: [phone]   Fax:  [phone]  Name: Yvette Collier MRN: [id number] Date of Birth: 05/06/1941

## 2017-10-10 NOTE — Therapy (Signed)
[place] [facility] MAIN [facility] SERVICES [address] [place], [place], [address] Phone: [phone]   Fax:  [phone]  Occupational Therapy Treatment  Patient Details  Name: Yvette RakersMary L Collier MRN: [id number] Date of Birth: March 22, 1941 Referring Provider: Dr. Wynn BankerKirsteins  Encounter Date: 10/10/2017      OT End of Session - 10/10/17 1149    Visit Number 6   Number of Visits 24   Date for OT Re-Evaluation 12/08/17   OT Start Time 1100   OT Stop Time 1145   OT Time Calculation (min) 45 min   Activity Tolerance Patient tolerated treatment well   Behavior [facility] for tasks assessed/performed      Past Medical History:  Diagnosis Date  . Asthma   . Chronic lower back pain   . COPD (chronic obstructive pulmonary disease) (HCC)   . Diabetes mellitus without complication (HCC)   . Gastrointestinal bleed   . GERD (gastroesophageal reflux disease)   . History of colon polyps   . Hypertension   . Hypertensive heart disease   . IBS (irritable bowel syndrome)   . Ischemic colitis (HCC)   . Non-obstructive Coronary Artery Disease    a. 05/2009 Cath [facility]): minimal nonobs dzs.  . Osteoarthritis   . PAF (paroxysmal atrial fibrillation) (HCC)    a. 09/2014 during GI illness;  b. CHA2DS2VASc = 5 (not currently on OAC 2/2 h/o GIB/ischemic colitis); c. 09/2014 Echo: EF 60-65%, imparied relaxation, nl RV size/fxn.  . Transient cerebral ischemia due to atrial fibrillation [facility](HCC)     Past Surgical History:  Procedure Laterality Date  . ABDOMINAL HYSTERECTOMY    . APPENDECTOMY    . BACK SURGERY     x 3, last 2004.  . CHOLECYSTECTOMY    . COLONOSCOPY WITH PROPOFOL N/A 03/20/2017   Procedure: COLONOSCOPY WITH PROPOFOL;  Surgeon: Wyline MoodKiran Anna, MD;  [facility];  Service: Endoscopy;  Laterality: N/A;  . ESOPHAGOGASTRODUODENOSCOPY (EGD) WITH PROPOFOL N/A 03/20/2017   Procedure: ESOPHAGOGASTRODUODENOSCOPY (EGD) WITH PROPOFOL;  Surgeon: Wyline MoodKiran Anna,  MD;  Location: [facility] ENDOSCOPY;  Service: Endoscopy;  Laterality: N/A;  . FOOT SURGERY Right   . HEMORRHOID BANDING    . left total hip arthroplasty  2012  . STOMACH SURGERY    . TONSILLECTOMY      There were no vitals filed for this visit.      Subjective Assessment - 10/10/17 1148    Subjective  Pt. repors feeling well today.   Patient is accompained by: Family member   Currently in Pain? No/denies      OT TREATMENT    Therapeutic Exercise:  Pt. performed 1.5# dowel ex. for UE strengthening secondary to weakness. Bilateral shoulder flexion, chest press, circular patterns, and elbow flexion/extension were performed.  Selfcare:  Pt. worked on  drawing, and copying a clock. Pt. required increased time to complete, and had difficulty entering numbers in the appropriate 1/2 of the clock. Pt. omitted numbers 6-12. Pt. worked on counting, and sorting change. Pt. worked on grasping change with her right hand and moving it through her hand to place it in the counter container.                           OT Education - 10/10/17 1148    Education provided Yes   Education Details [facility], strengthening, and Cognitive IADLs.   Person(s) Educated Patient   Methods Explanation;Demonstration;Tactile cues   Comprehension Verbalized understanding;Returned  demonstration;Verbal cues required;Tactile cues required;Need further instruction             OT Long Term Goals - 09/15/17 1208      OT LONG TERM GOAL #1   Title Pt. will be independent with basic ADL care needs.   Baseline Eval: Pt. requires assist   Time 12   Period Weeks   Status New   Target Date 12/08/17     OT LONG TERM GOAL #2   Title Pt. will be independent with light meal preparation.   Baseline Eval: Pt. is unable   Time 12   Period Weeks   Status New   Target Date 12/08/17     OT LONG TERM GOAL #3   Title Pt. will be independent with tub/shower transfers   Baseline Eval: Pt. requires assist    Time 12   Period Weeks   Status New   Target Date 12/08/17     OT LONG TERM GOAL #4   Title Pt. will increase BUE strength by 2 mm grades to assist with ADLs.   Baseline Eval: BUE strength 4/5 overall   Time 12   Period Weeks   Status New   Target Date 12/08/17     OT LONG TERM GOAL #5   Title Pt. will improve right [facility] skills to be able to assist with buttoning, and zipping.    Baseline Eval: impaired   Time 12   Period Weeks   Status New   Target Date 12/08/17     OT LONG TERM GOAL #6   Title Pt. will improve right grip strength to be able to open containers.   Baseline Eval: Limited.   Time 12   Period Weeks   Target Date 12/08/17     OT LONG TERM GOAL #7   Title Pt. will identify potential safety hazards accurately during ADLs, and IADLs with 100% accuracy.   Baseline Eval: limited   Time 56   Status New   Target Date 12/08/17     OT LONG TERM GOAL #8   Title Pt. will demonstrate cognitive compensatory techniques 100% of the time with minimal cues.   Baseline Eval: limited   Time 12   Period Weeks   Status New   Target Date 12/08/17               Plan - 10/10/17 1150    Clinical Impression Statement Pt. continues to present with confusion, and impaired cognition. Pt. requires continuous verbal cues, and cognitive assist to perform ADL, and IADL tasks. Pt. requires assist for safety. awareness, and judgement. Pt. Continues to work on improving ADL, and IADL performance tasks.   Occupational performance deficits (Please refer to evaluation for details): ADL's;IADL's   Rehab Potential Good   OT Frequency 2x / week   OT Duration 12 weeks   OT Treatment/Interventions Self-care/ADL training;Therapeutic exercise;DME and/or AE instruction;Patient/family education;Therapeutic activities;Therapeutic exercises;Energy conservation   Consulted and Agree with Plan of Care Patient      Patient will benefit from skilled therapeutic intervention in order to  improve the following deficits and impairments:  Abnormal gait, Decreased activity tolerance, Decreased cognition, Decreased balance, Decreased coordination, Decreased strength, Impaired UE functional use  Visit Diagnosis: Muscle weakness (generalized)  Other lack of coordination  Cognitive social or emotional deficit following cerebral infarction    Problem List Patient Active Problem List   Diagnosis Date Noted  . Gait disturbance, post-stroke 09/04/2017  . Wernicke's aphasia 09/04/2017  .  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 However, needs to rule out CAA, tumor or CVT (vein of labbe)  09/01/2017  . Constipation 01/06/2017  . DJD (degenerative joint disease) of knee 07/18/2016  . GIB (gastrointestinal bleeding) 05/25/2016  . GI bleed 05/24/2016  . Degenerative joint disease (DJD) of hip 05/08/2016  . 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 Hypertensive heart disease   . Hemorrhage of gastrointestinal tract 11/13/2015  . Lumbosacral facet joint syndrome 07/26/2015  . Angioedema 07/13/2015  . DM2 (diabetes mellitus, type 2) (HCC) 07/13/2015  . Status post lumbar laminectomy 06/19/2015  . Lumbar radiculopathy 06/19/2015  . DDD (degenerative disc disease), lumbar 05/11/2015  . Facet syndrome, lumbar 05/11/2015  . Lumbar post-laminectomy syndrome 05/11/2015  . Sacroiliac joint disease 05/11/2015  . 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 Neuropathy due to secondary diabetes (HCC) 05/11/2015  . Essential hypertension 01/03/2015  . Coronary atherosclerosis of native coronary artery   . PAF (paroxysmal atrial fibrillation) (HCC) 10/20/2014  . Ischemic colitis (HCC) 10/20/2014  . Diabetes mellitus type 2 with complications (HCC) 10/20/2014  . COPD (chronic obstructive pulmonary disease) (HCC) 10/20/2014  . Hyperlipidemia 10/20/2014  . GERD  (gastroesophageal reflux disease) 10/20/2014  . Ingrowing nail, right great toe 04/27/2014  . Internal hemorrhoid, bleeding 07/14/2013    Olegario Messier, MS, OTR/L 10/10/2017, 11:58 AM  [place] [facility] MAIN [facility] SERVICES [address] [place], [place], [address] Phone: [phone]   Fax:  [phone]  Name: Yvette Collier MRN: [id number] Date of Birth: 1941-08-08

## 2017-10-13 ENCOUNTER — Ambulatory Visit: Payer: Medicare Other | Admitting: Speech Pathology

## 2017-10-13 ENCOUNTER — Ambulatory Visit: Payer: Medicare Other | Admitting: Occupational Therapy

## 2017-10-13 DIAGNOSIS — M6281 Muscle weakness (generalized): Secondary | ICD-10-CM

## 2017-10-13 DIAGNOSIS — R278 Other lack of coordination: Secondary | ICD-10-CM

## 2017-10-13 DIAGNOSIS — R4701 Aphasia: Secondary | ICD-10-CM | POA: Diagnosis not present

## 2017-10-13 NOTE — Therapy (Signed)
[facility] MAIN [facility] SERVICES [address] [place], [place], [address] Phone: [phone]   Fax:  [phone]  Occupational Therapy Treatment  Patient Details  Name: Yvette Collier MRN: [id number] Date of Birth: 1941/10/03 Referring Provider: Dr. Wynn Banker  Encounter Date: 10/13/2017      OT End of Session - 10/13/17 1051    Visit Number 7   Number of Visits 24   Date for OT Re-Evaluation 12/08/17   OT Start Time 0917   OT Stop Time 1000   OT Time Calculation (min) 43 min   Activity Tolerance Patient tolerated treatment well   Behavior During Therapy [facility] for tasks assessed/performed      Past Medical History:  Diagnosis Date  . Asthma   . Chronic lower back pain   . COPD (chronic obstructive pulmonary disease) (HCC)   . Diabetes mellitus without complication (HCC)   . Gastrointestinal bleed   . GERD (gastroesophageal reflux disease)   . History of colon polyps   . Hypertension   . Hypertensive heart disease   . IBS (irritable bowel syndrome)   . Ischemic colitis (HCC)   . Non-obstructive Coronary Artery Disease    a. 05/2009 Cath [facility]): minimal nonobs dzs.  . Osteoarthritis   . PAF (paroxysmal atrial fibrillation) (HCC)    a. 09/2014 during GI illness;  b. CHA2DS2VASc = 5 (not currently on OAC 2/2 h/o GIB/ischemic colitis); c. 09/2014 Echo: EF 60-65%, imparied relaxation, nl RV size/fxn.  . Transient cerebral ischemia due to atrial fibrillation [facility])     Past Surgical History:  Procedure Laterality Date  . ABDOMINAL HYSTERECTOMY    . APPENDECTOMY    . BACK SURGERY     x 3, last 2004.  . CHOLECYSTECTOMY    . COLONOSCOPY WITH PROPOFOL N/A 03/20/2017   Procedure: COLONOSCOPY WITH PROPOFOL;  Surgeon: Wyline Mood, MD;  Location: [facility] ENDOSCOPY;  Service: Endoscopy;  Laterality: N/A;  . ESOPHAGOGASTRODUODENOSCOPY (EGD) WITH PROPOFOL N/A 03/20/2017   Procedure: ESOPHAGOGASTRODUODENOSCOPY (EGD) WITH PROPOFOL;  Surgeon: Wyline Mood,  MD;  Location: [facility] ENDOSCOPY;  Service: Endoscopy;  Laterality: N/A;  . FOOT SURGERY Right   . HEMORRHOID BANDING    . left total hip arthroplasty  2012  . STOMACH SURGERY    . TONSILLECTOMY      There were no vitals filed for this visit.      Subjective Assessment - 10/13/17 1007    Subjective  Pt. rpeorts having 6/10 back, and side pain today uses The WOng-Baker Facial Grimace Scale.   Patient is accompained by: Family member   Pertinent History Pt. is a 76 y.o. female who suffered a Left temporla Lobe Hemorrhage secondary to Hypertensive Crisis, Moderate ICH, Left Frontal Trace SDH, and Traumatic ICH secondary to a fall. Pt. PMHX includes: HTN, DM with peripheral Neuropathy, Acute Kidney Injury, Hyperlipdidemia, and history of a GI Bleed. Pt. went the inpatient rehab at [facility], was discharged, and now is ready for outpatient rehab services.   Currently in Pain? Yes   Pain Score 6    Pain Location Back   Pain Orientation Lower;Lateral   Pain Descriptors / Indicators Aching      OT TREATMENT    Neuro muscular re-education:  Pt. worked on grasping, flipping and stacking 2" large pegs on the Instructo board placed at a tabletop surface. Pt. requires visual cues, verbal cues, and step-by-step instruction. Pt. worked on sequencing by color. Pt. worked on grasping one inch resistive cubes alternating  thumb opposition to the tip of the 2nd through 5th digits. The board was positioned at a vertical angle. Pt. worked on pressing them back into place while isolating 2nd through 5th digits.                           OT Education - 10/13/17 1009    Education provided Yes   Education Details [facility]   Methods Explanation;Demonstration;Tactile cues   Comprehension Verbalized understanding;Returned demonstration;Verbal cues required;Tactile cues required;Need further instruction             OT Long Term Goals - 09/15/17 1208      OT LONG TERM GOAL #1   Title  Pt. will be independent with basic ADL care needs.   Baseline Eval: Pt. requires assist   Time 12   Period Weeks   Status New   Target Date 12/08/17     OT LONG TERM GOAL #2   Title Pt. will be independent with light meal preparation.   Baseline Eval: Pt. is unable   Time 12   Period Weeks   Status New   Target Date 12/08/17     OT LONG TERM GOAL #3   Title Pt. will be independent with tub/shower transfers   Baseline Eval: Pt. requires assist   Time 12   Period Weeks   Status New   Target Date 12/08/17     OT LONG TERM GOAL #4   Title Pt. will increase BUE strength by 2 mm grades to assist with ADLs.   Baseline Eval: BUE strength 4/5 overall   Time 12   Period Weeks   Status New   Target Date 12/08/17     OT LONG TERM GOAL #5   Title Pt. will improve right [facility] skills to be able to assist with buttoning, and zipping.    Baseline Eval: impaired   Time 12   Period Weeks   Status New   Target Date 12/08/17     OT LONG TERM GOAL #6   Title Pt. will improve right grip strength to be able to open containers.   Baseline Eval: Limited.   Time 12   Period Weeks   Target Date 12/08/17     OT LONG TERM GOAL #7   Title Pt. will identify potential safety hazards accurately during ADLs, and IADLs with 100% accuracy.   Baseline Eval: limited   Time 1412   Status New   Target Date 12/08/17     OT LONG TERM GOAL #8   Title Pt. will demonstrate cognitive compensatory techniques 100% of the time with minimal cues.   Baseline Eval: limited   Time 12   Period Weeks   Status New   Target Date 12/08/17               Plan - 10/13/17 1053    Clinical Impression Statement Pt. continues to present with limited cognition. Pt. continues to present with cognitive impairments, 6/10 back pain, and weakness, which conitnue to limit her ability to complete ADLs, and IADL functioning. Pt. continues to work on improving engagement in ADLs, and IADLs.   Occupational performance  deficits (Please refer to evaluation for details): ADL's;IADL's   Rehab Potential Good   OT Frequency 2x / week   OT Duration 12 weeks   OT Treatment/Interventions Self-care/ADL training;Therapeutic exercise;DME and/or AE instruction;Patient/family education;Therapeutic activities;Therapeutic exercises;Energy conservation   Consulted and Agree with Plan of Care Patient  Patient will benefit from skilled therapeutic intervention in order to improve the following deficits and impairments:  Abnormal gait, Decreased activity tolerance, Decreased cognition, Decreased balance, Decreased strength, Impaired UE functional use  Visit Diagnosis: Muscle weakness (generalized)  Other lack of coordination    Problem List Patient Active Problem List   Diagnosis Date Noted  . Gait disturbance, post-stroke 09/04/2017  . Wernicke's aphasia 09/04/2017  . 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 However, needs to rule out CAA, tumor or CVT (vein of labbe)  09/01/2017  . Constipation 01/06/2017  . DJD (degenerative joint disease) of knee 07/18/2016  . GIB (gastrointestinal bleeding) 05/25/2016  . GI bleed 05/24/2016  . Degenerative joint disease (DJD) of hip 05/08/2016  . 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 Hypertensive heart disease   . Hemorrhage of gastrointestinal tract 11/13/2015  . Lumbosacral facet joint syndrome 07/26/2015  . Angioedema 07/13/2015  . DM2 (diabetes mellitus, type 2) (HCC) 07/13/2015  . Status post lumbar laminectomy 06/19/2015  . Lumbar radiculopathy 06/19/2015  . DDD (degenerative disc disease), lumbar 05/11/2015  . Facet syndrome, lumbar 05/11/2015  . Lumbar post-laminectomy syndrome 05/11/2015  . Sacroiliac joint disease 05/11/2015  . 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 Neuropathy due to secondary diabetes (HCC) 05/11/2015  .  Essential hypertension 01/03/2015  . Coronary atherosclerosis of native coronary artery   . PAF (paroxysmal atrial fibrillation) (HCC) 10/20/2014  . Ischemic colitis (HCC) 10/20/2014  . Diabetes mellitus type 2 with complications (HCC) 10/20/2014  . COPD (chronic obstructive pulmonary disease) (HCC) 10/20/2014  . Hyperlipidemia 10/20/2014  . GERD (gastroesophageal reflux disease) 10/20/2014  . Ingrowing nail, right great toe 04/27/2014  . Internal hemorrhoid, bleeding 07/14/2013    Olegario Messier, MS, OTR/L 10/13/2017, 11:02 AM  [place] [place] [facility] MAIN [facility] SERVICES [address] [place], [place], [address] Phone: [phone]   Fax:  [phone]  Name: JAMILEX BOHNSACK MRN: [id number] Date of Birth: Feb 19, 1941

## 2017-10-14 NOTE — Therapy (Signed)
[facility] MAIN [facility] SERVICES [address] [place], [place], [address] Phone: [phone]   Fax:  [phone]  Speech Language Pathology Treatment  Patient Details  Name: Yvette Collier MRN: [id number] Date of Birth: July 31, 1941 Referring Provider: Dr. Wynn Banker  Encounter Date: 10/13/2017      End of Session - 10/14/17 1015    Visit Number 5   Number of Visits 25   Date for SLP Re-Evaluation 12/15/17   SLP Start Time 1000   SLP Stop Time  1100   SLP Time Calculation (min) 60 min   Activity Tolerance Patient tolerated treatment well      Past Medical History:  Diagnosis Date  . Asthma   . Chronic lower back pain   . COPD (chronic obstructive pulmonary disease) (HCC)   . Diabetes mellitus without complication (HCC)   . Gastrointestinal bleed   . GERD (gastroesophageal reflux disease)   . History of colon polyps   . Hypertension   . Hypertensive heart disease   . IBS (irritable bowel syndrome)   . Ischemic colitis (HCC)   . Non-obstructive Coronary Artery Disease    a. 05/2009 Cath [facility]): minimal nonobs dzs.  . Osteoarthritis   . PAF (paroxysmal atrial fibrillation) (HCC)    a. 09/2014 during GI illness;  b. CHA2DS2VASc = 5 (not currently on OAC 2/2 h/o GIB/ischemic colitis); c. 09/2014 Echo: EF 60-65%, imparied relaxation, nl RV size/fxn.  . Transient cerebral ischemia due to atrial fibrillation [facility])     Past Surgical History:  Procedure Laterality Date  . ABDOMINAL HYSTERECTOMY    . APPENDECTOMY    . BACK SURGERY     x 3, last 2004.  . CHOLECYSTECTOMY    . COLONOSCOPY WITH PROPOFOL N/A 03/20/2017   Procedure: COLONOSCOPY WITH PROPOFOL;  Surgeon: Wyline Mood, MD;  Location: [facility] ENDOSCOPY;  Service: Endoscopy;  Laterality: N/A;  . ESOPHAGOGASTRODUODENOSCOPY (EGD) WITH PROPOFOL N/A 03/20/2017   Procedure: ESOPHAGOGASTRODUODENOSCOPY (EGD) WITH PROPOFOL;  Surgeon: Wyline Mood, MD;  Location: [facility] ENDOSCOPY;  Service: Endoscopy;   Laterality: N/A;  . FOOT SURGERY Right   . HEMORRHOID BANDING    . left total hip arthroplasty  2012  . STOMACH SURGERY    . TONSILLECTOMY      There were no vitals filed for this visit.      Subjective Assessment - 10/14/17 1014    Subjective Patient pleasant and eager for therapy   Currently in Pain? Yes               ADULT SLP TREATMENT - 10/14/17 0001      General Information   Behavior/Cognition Alert;Cooperative;Pleasant mood;Requires cueing   HPI Patient is a 76 y/o woman with left temproal lobe hemorrhage on 09/01/17.     Treatment Provided   Treatment provided Cognitive-Linquistic     Pain Assessment   Pain Assessment No/denies pain     Cognitive-Linquistic Treatment   Treatment focused on Aphasia   Skilled Treatment VERBAL EXPRESSION: The patient is not able to name pictured or actual objects ("I've seen this before" "This is nice").  Patient able to read aloud 3-letter words with 60% accuracy, no cues.  COMPREHENSION: Match written word to pictured object (f=10) with 50% accuracy, no cues.  Identify (f=10) spoken word to pictured object/written word with 80% accuracy.  Follow 1-step motor commands with 65% accuracy.     Assessment / Recommendations / Plan   Plan Continue with current plan of care  Progression Toward Goals   Progression toward goals Progressing toward goals          SLP Education - 10/14/17 1014    Education provided Yes   Education Details look at the letters when reading   Person(s) Educated Patient   Methods Explanation   Comprehension Verbalized understanding            SLP Long Term Goals - 09/15/17 1329      SLP LONG TERM GOAL #1   Title Patient will complete 2 unit processing tasks with 80% accuracy without the need of repetition of task instructions or significant delays in responding.   Time 8   Period Weeks   Status New   Target Date 11/15/17     SLP LONG TERM GOAL #2   Title Patient will complete 3 unit  processing tasks with 80% accuracy without the need of repetition of task instructions or significant delays in responding.   Time 12   Period Weeks   Status New   Target Date 12/15/17     SLP LONG TERM GOAL #3   Title Patient will name common objects with 50% accuracy.   Time 8   Period Weeks   Status New   Target Date 11/15/17     SLP LONG TERM GOAL #4   Title Patient will name common objects with 80% accuracy.   Time 12   Period Weeks   Status New   Target Date 12/15/17     SLP LONG TERM GOAL #5   Title Patient will generate meaningful phrase to complete simple/concrete linguistic task with 80% accuracy.   Time 12   Period Weeks   Status New   Target Date 12/15/17          Plan - 10/14/17 1015    Clinical Impression Statement Patient able to read single words aloud with greater accuracy and independence than name objects.  Will continue reading fluency/comprehension in combination with verbal expression.   Speech Therapy Frequency 2x / week   Duration Other (comment)   Treatment/Interventions Language facilitation;Multimodal communcation approach;SLP instruction and feedback;Patient/family education   Potential to Achieve Goals Good   Potential Considerations Ability to learn/carryover information;Co-morbidities;Cooperation/participation level;Medical prognosis;Pain level;Previous level of function;Severity of impairments;Family/community support   SLP Home Exercise Plan To be determined depending on pt's ability to independently complete tasks   Consulted and Agree with Plan of Care Patient      Patient will benefit from skilled therapeutic intervention in order to improve the following deficits and impairments:   Aphasia    Problem List Patient Active Problem List   Diagnosis Date Noted  . Gait disturbance, post-stroke 09/04/2017  . Wernicke's aphasia 09/04/2017  . Left temporal lobe hemorrhage (HCC) -  left temporal moderate ICH and left frontal trace SDH,  concerning for traumatic ICH due to fall. However, needs to rule out CAA, tumor or CVT (vein of labbe)  09/01/2017  . Constipation 01/06/2017  . DJD (degenerative joint disease) of knee 07/18/2016  . GIB (gastrointestinal bleeding) 05/25/2016  . GI bleed 05/24/2016  . Degenerative joint disease (DJD) of hip 05/08/2016  . 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 Hypertensive heart disease   . Hemorrhage of gastrointestinal tract 11/13/2015  . Lumbosacral facet joint syndrome 07/26/2015  . Angioedema 07/13/2015  . DM2 (diabetes mellitus, type 2) (HCC) 07/13/2015  . Status post lumbar laminectomy 06/19/2015  . Lumbar radiculopathy 06/19/2015  . DDD (degenerative disc disease), lumbar 05/11/2015  . Facet syndrome, lumbar 05/11/2015  .  Lumbar post-laminectomy syndrome 05/11/2015  . Sacroiliac joint disease 05/11/2015  . 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 Neuropathy due to secondary diabetes (HCC) 05/11/2015  . Essential hypertension 01/03/2015  . Coronary atherosclerosis of native coronary artery   . PAF (paroxysmal atrial fibrillation) (HCC) 10/20/2014  . Ischemic colitis (HCC) 10/20/2014  . Diabetes mellitus type 2 with complications (HCC) 10/20/2014  . COPD (chronic obstructive pulmonary disease) (HCC) 10/20/2014  . Hyperlipidemia 10/20/2014  . GERD (gastroesophageal reflux disease) 10/20/2014  . Ingrowing nail, right great toe 04/27/2014  . Internal hemorrhoid, bleeding 07/14/2013   Dollene Primrose, MS/CCC- SLP   Leandrew Koyanagi 10/14/2017, 10:16 AM  [place] [facility] MAIN [facility] SERVICES [address] [place], [place], [address] Phone: [phone]   Fax:  [phone]   Name: Yvette Collier MRN: [id number] Date of Birth: June 02, 1941

## 2017-10-15 ENCOUNTER — Ambulatory Visit: Payer: Medicare Other | Admitting: Speech Pathology

## 2017-10-15 ENCOUNTER — Ambulatory Visit: Payer: Medicare Other

## 2017-10-15 VITALS — BP 151/95 | HR 65

## 2017-10-15 DIAGNOSIS — R4701 Aphasia: Secondary | ICD-10-CM | POA: Diagnosis not present

## 2017-10-15 DIAGNOSIS — R2681 Unsteadiness on feet: Secondary | ICD-10-CM

## 2017-10-15 DIAGNOSIS — M6281 Muscle weakness (generalized): Secondary | ICD-10-CM

## 2017-10-15 NOTE — Therapy (Signed)
[facility] MAIN [facility] SERVICES [address] [place], [place], [address] Phone: [phone]   Fax:  [phone]  Physical Therapy Treatment/Goals Update   Patient Details  Name: Yvette Collier MRN: [id number] Date of Birth: 12-19-1941 Referring Provider: BURNS, HARRIETT P  Encounter Date: 10/15/2017      PT End of Session - 10/15/17 1036    Visit Number 5   Number of Visits 25   Date for PT Re-Evaluation 12/08/17   Authorization Type g code 5/10   PT Start Time 1022   PT Stop Time 1100   PT Time Calculation (min) 38 min   Equipment Utilized During Treatment Gait belt   Activity Tolerance Patient tolerated treatment well;Other (comment)  Easily distracted   Behavior During Therapy [facility] for tasks assessed/performed      Past Medical History:  Diagnosis Date  . Asthma   . Chronic lower back pain   . COPD (chronic obstructive pulmonary disease) (HCC)   . Diabetes mellitus without complication (HCC)   . Gastrointestinal bleed   . GERD (gastroesophageal reflux disease)   . History of colon polyps   . Hypertension   . Hypertensive heart disease   . IBS (irritable bowel syndrome)   . Ischemic colitis (HCC)   . Non-obstructive Coronary Artery Disease    a. 05/2009 Cath [facility]): minimal nonobs dzs.  . Osteoarthritis   . PAF (paroxysmal atrial fibrillation) (HCC)    a. 09/2014 during GI illness;  b. CHA2DS2VASc = 5 (not currently on OAC 2/2 h/o GIB/ischemic colitis); c. 09/2014 Echo: EF 60-65%, imparied relaxation, nl RV size/fxn.  . Transient cerebral ischemia due to atrial fibrillation [facility])     Past Surgical History:  Procedure Laterality Date  . ABDOMINAL HYSTERECTOMY    . APPENDECTOMY    . BACK SURGERY     x 3, last 2004.  . CHOLECYSTECTOMY    . COLONOSCOPY WITH PROPOFOL N/A 03/20/2017   Procedure: COLONOSCOPY WITH PROPOFOL;  Surgeon: Wyline Mood, MD;  [facility] ENDOSCOPY;  Service: Endoscopy;  Laterality: N/A;  .  ESOPHAGOGASTRODUODENOSCOPY (EGD) WITH PROPOFOL N/A 03/20/2017   Procedure: ESOPHAGOGASTRODUODENOSCOPY (EGD) WITH PROPOFOL;  Surgeon: Wyline Mood, MD;  Location: [facility] ENDOSCOPY;  Service: Endoscopy;  Laterality: N/A;  . FOOT SURGERY Right   . HEMORRHOID BANDING    . left total hip arthroplasty  2012  . STOMACH SURGERY    . TONSILLECTOMY      Vitals:   10/15/17 1026  BP: (!) 151/95  Pulse: 65  SpO2: 100%        Subjective Assessment - 10/15/17 1032    Subjective Pt reports no new concerns or complaints.  She reports 5/10 chronic low back pain which is her baseline. No recent changes in her health. No falls recently.    Pertinent History Yvette Collier a 76 y.o.right handed femalewith history of hypertension,[name],diabetes mellitus, COPD. Patient lives alone independent prior to admission and works as a Education officer, environmental. One level home. Admitted 09/01/2017 with altered mental statusright side weaknessas well as aphasia. CT of the head showed a large acute left temporal lobe hematoma approximately 15.9 mL suspect hypertensive hemorrhage.Pt presents today with fluent aphasia.   Limitations Walking   How long can you sit comfortably? 30 minutes before needing to shift positions   How long can you stand comfortably? about 30 minutes before needing to sit and rest   How long can you walk comfortably? states that  she has no issues with walking   Patient Stated Goals "get stronger"   Currently in Pain? Yes   Pain Score 5    Pain Location Back   Pain Orientation Lower   Pain Descriptors / Indicators Aching   Pain Type Chronic pain   Pain Onset More than a month ago            [facility] PT Assessment - 10/15/17 1041      Strength   Strength Assessment Site Hip   Right/Left Hip Right;Left   Right Hip Flexion 4+/5   Right Hip Extension 3/5   Right Hip ABduction 3+/5   Right Hip ADduction 3+/5   Left Hip Flexion 4+/5   Left Hip ABduction 4-/5    Left Hip ADduction 4-/5   Right Knee Flexion 5/5   Right Knee Extension 5/5   Left Knee Flexion 5/5   Left Knee Extension 5/5   Right Ankle Dorsiflexion 5/5   Left Ankle Dorsiflexion 5/5     Standardized Balance Assessment   Standardized Balance Assessment Berg Balance Test;Timed Up and Go Test;Five Times Sit to Stand;10 meter walk test   Five times sit to stand comments  21.41 seconds   10 Meter Walk 10.0s = 1.0 m/s     Berg Balance Test   Sit to Stand Able to stand without using hands and stabilize independently   Standing Unsupported Able to stand safely 2 minutes   Sitting with Back Unsupported but Feet Supported on Floor or Stool Able to sit safely and securely 2 minutes   Stand to Sit Sits safely with minimal use of hands   Transfers Able to transfer safely, minor use of hands   Standing Unsupported with Eyes Closed Able to stand 10 seconds safely   Standing Ubsupported with Feet Together Able to place feet together independently and stand 1 minute safely   From Standing, Reach Forward with Outstretched Arm Can reach forward >12 cm safely (5")   From Standing Position, Pick up Object from Floor Able to pick up shoe safely and easily   From Standing Position, Turn to Look Behind Over each Shoulder Looks behind from both sides and weight shifts well   Turn 360 Degrees Able to turn 360 degrees safely in 4 seconds or less   Standing Unsupported, Alternately Place Feet on Step/Stool Able to stand independently and safely and complete 8 steps in 20 seconds   Standing Unsupported, One Foot in Front Able to plae foot ahead of the other independently and hold 30 seconds   Standing on One Leg Tries to lift leg/unable to hold 3 seconds but remains standing independently   Total Score 51     Timed Up and Go Test   TUG Normal TUG   Normal TUG (seconds) 11.23   TUG Comments no assistive device          TREATMENT   Ther-ex  Repeated outcome measures with patient including BERG,  5TSTS, 5956m gait speed, and TUG; Sit to stand practice without UE support with 2.5# overhead bar press when in standing 2 x 10; Seated marches 2 x 20 B LE with 3# ankle weights;  LAQ 2 x 20 B LE with 3# ankle weights;  Tandem walking on 2x4 in // bars x multiple laps with CGA; Bridges 2 x 20   Pt requires frequent visual, verbal and tactile cues in order to complete tasks today. Difficulty following commands, easily forgets what exercise she was doing. Increased time required  to perform outcome measures due to need for demonstration and heavy cueing.                         PT Education - 10/15/17 1032    Education provided Yes   Education Details exercise form/technique;   Person(s) Educated Patient   Methods Explanation   Comprehension Verbalized understanding          PT Short Term Goals - 10/15/17 1036      PT SHORT TERM GOAL #1   Title Pt will improve 5x sit to stand to 16 seconds in order to demonstrate improved LE strength and improved dynamic balance to decrease risk for falls.   Baseline 18 sec, 10/15/17: 21.41 s   Time 6   Period Weeks   Status On-going   Target Date 10/27/17     PT SHORT TERM GOAL #2   Title Pt will improve gait speed to at least 0.8 m/s, demonstrating improved LE strength in order to safely ambulate with decreased risk for fals.   Baseline 0.688 m/s, 10/15/17: 1.0 m/s   Time 6   Period Weeks   Status Achieved   Target Date 10/27/17     PT SHORT TERM GOAL #3   Title Pt will improve TUG to at least 16 seconds in order to demonstrate improve LE strength and dynamic balance.   Baseline 19 sec; 10/15/17: 11.23s   Time 6   Period Weeks   Status Achieved     PT SHORT TERM GOAL #4   Title Pt will improve Berg Balance score to 46/56 in order to demonstarte improved dynamic and static balance in order to decrease overall falls risk.   Baseline 41/56; 51/56   Time 6   Period Weeks   Status Achieved           PT Long Term  Goals - 10/15/17 1036      PT LONG TERM GOAL #1   Title Pt will demonstrate independence with HEP in order to continue LE strengthening and balance interventions at home to manage symptoms and decrease overall falls risk.   Time 12   Period Weeks   Status On-going   Target Date 12/08/17     PT LONG TERM GOAL #2   Title Pt will improve 5x sit to stand to <15 seconds in order to demonstrate improved LE strength and improved balance in order to decrease falls risk.   Baseline 18 sec; 10/15/17: 21.41s   Time 12   Period Weeks   Status On-going   Target Date 12/08/17     PT LONG TERM GOAL #3   Title Pt will improve gait speed to at least 1.0 m/s in order to demonstrate improved LE strength and balance in order to ambulate with decreased risk for falls.   Baseline 0.688 m/s; 10/15/17: 1.0 m/s   Time 12   Period Weeks   Status Achieved   Target Date 12/08/17     PT LONG TERM GOAL #4   Title Pt will demonstrate an improved TUG score to < or equal to 14 seconds in order to demonstrate improved LE strength and balance in order to decrease overall falls risk.   Baseline 19 sec; 10/15/17: 11.23s   Time 12   Period Weeks   Status Achieved   Target Date 12/08/17     PT LONG TERM GOAL #5   Title Pt will demonstrate an improved Berg Balance score to at least  51/56 in order to demonstrate improved dynamic and static balance activities.   Baseline 41/56; 10/15/17: 51/56   Time 12   Period Weeks   Status Achieved   Target Date 12/08/17     Additional Long Term Goals   Additional Long Term Goals Yes     PT LONG TERM GOAL #6   Title Pt will improve LE strength to 4+/5 in all limited planes in order to increase funtional abilities and improve gait.   Baseline Gross strength assessment: 3+/5, L hip ext 2+/5, R hip ext 3-/5; 10/15/17: hip extension 3/5 bilaterally, hip abduction/adduction: 3+/5 on R side, 4-/5 on L side   Time 12   Period Weeks   Status On-going   Target Date 12/08/17      PT LONG TERM GOAL #7   Title Pt will improve gait speed to at least 1.2 m/s in order to demonstrate improved LE strength and balance in order to ambulate with decreased risk for falls.   Baseline 0.688 m/s; 10/15/17: 1.0 m/s   Time 8   Period Weeks   Status New   Target Date 12/08/17     PT LONG TERM GOAL #8   Title Pt will demonstrate an improved Berg Balance score to at least 54/56 in order to demonstrate improved dynamic and static balance activities.   Baseline 41/56; 10/15/17: 51/56   Time 8   Period Weeks   Status New   Target Date 12/08/17               Plan - 10/15/17 1036    Clinical Impression Statement Pt demonstrates good motivation with therapy today but does require frequent visual, verbal and tactile cues in order to complete tasks today. Difficulty following commands, easily forgets what exercise she was doing. Outcome measures performed today and pt demonstrates significant improvement in 50m gait speed, TUG, and BERG. Goals updated today.  She will benefit from continued skilled PT interventions for improved strength, gait mechanics, and balance in order to improve function.    Rehab Potential Good   PT Frequency 2x / week   PT Duration 12 weeks   PT Treatment/Interventions Aquatic Therapy;Cryotherapy;Moist Heat;Gait training;Stair training;Functional mobility training;Therapeutic activities;Therapeutic exercise;Balance training;Neuromuscular re-education;Patient/family education;Manual techniques;Passive range of motion   PT Next Visit Plan begin LE strength and dynamic balance activities   PT Home Exercise Plan seated marches, seated hip ABD with red theraband   Consulted and Agree with Plan of Care Patient      Patient will benefit from skilled therapeutic intervention in order to improve the following deficits and impairments:  Abnormal gait, Decreased coordination, Decreased activity tolerance, Decreased balance, Decreased cognition, Decreased mobility,  Decreased safety awareness, Decreased strength, Difficulty walking  Visit Diagnosis: Muscle weakness (generalized)  Unsteadiness on feet     Problem List Patient Active Problem List   Diagnosis Date Noted  . Gait disturbance, post-stroke 09/04/2017  . Wernicke's aphasia 09/04/2017  . 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 However, needs to rule out CAA, tumor or CVT (vein of labbe)  09/01/2017  . Constipation 01/06/2017  . DJD (degenerative joint disease) of knee 07/18/2016  . GIB (gastrointestinal bleeding) 05/25/2016  . GI bleed 05/24/2016  . Degenerative joint disease (DJD) of hip 05/08/2016  . 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 Hypertensive heart disease   . Hemorrhage of gastrointestinal tract 11/13/2015  . Lumbosacral facet joint syndrome 07/26/2015  .  Angioedema 07/13/2015  . DM2 (diabetes mellitus, type 2) (HCC) 07/13/2015  . Status post lumbar laminectomy 06/19/2015  . Lumbar radiculopathy 06/19/2015  . DDD (degenerative disc disease), lumbar 05/11/2015  . Facet syndrome, lumbar 05/11/2015  . Lumbar post-laminectomy syndrome 05/11/2015  . Sacroiliac joint disease 05/11/2015  . 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 Neuropathy due to secondary diabetes (HCC) 05/11/2015  . Essential hypertension 01/03/2015  . Coronary atherosclerosis of native coronary artery   . PAF (paroxysmal atrial fibrillation) (HCC) 10/20/2014  . Ischemic colitis (HCC) 10/20/2014  . Diabetes mellitus type 2 with complications (HCC) 10/20/2014  . COPD (chronic obstructive pulmonary disease) (HCC) 10/20/2014  . Hyperlipidemia 10/20/2014  . GERD (gastroesophageal reflux disease) 10/20/2014  . Ingrowing nail, right great toe 04/27/2014  . Internal hemorrhoid, bleeding 07/14/2013   Lynnea Maizes PT, DPT   Huprich,Jason 10/15/2017, 11:06 AM  [facility] MAIN [facility] SERVICES [address] [place], [place], [address] Phone: [phone]   Fax:  [phone]  Name: Yvette Collier MRN: [id number] Date of Birth: December 30, 1940

## 2017-10-16 ENCOUNTER — Other Ambulatory Visit: Payer: Self-pay | Admitting: Gastroenterology

## 2017-10-16 ENCOUNTER — Encounter: Payer: Self-pay | Admitting: Speech Pathology

## 2017-10-16 NOTE — Therapy (Signed)
[facility] [address] [place], [place], [address] Phone: [phone]   Fax:  [phone]  Speech Language Pathology Treatment  Patient Details  Name: Yvette RakersMary L Collier MRN: [id number] Date of Birth: 1941/12/17 Referring Provider: Dr. Wynn BankerKirsteins  Encounter Date: 10/15/2017      End of Session - 10/16/17 1116    Visit Number 6   Number of Visits 25   Date for SLP Re-Evaluation 12/15/17   SLP Start Time 0916   SLP Stop Time  1000   SLP Time Calculation (min) 44 min   Activity Tolerance Patient tolerated treatment well      Past Medical History:  Diagnosis Date  . Asthma   . Chronic lower back pain   . COPD (chronic obstructive pulmonary disease) (HCC)   . Diabetes mellitus without complication (HCC)   . Gastrointestinal bleed   . GERD (gastroesophageal reflux disease)   . History of colon polyps   . Hypertension   . Hypertensive heart disease   . IBS (irritable bowel syndrome)   . Ischemic colitis (HCC)   . Non-obstructive Coronary Artery Disease    a. 05/2009 Cath [facility]): minimal nonobs dzs.  . Osteoarthritis   . PAF (paroxysmal atrial fibrillation) (HCC)    a. 09/2014 during GI illness;  b. CHA2DS2VASc = 5 (not currently on OAC 2/2 h/o GIB/ischemic colitis); c. 09/2014 Echo: EF 60-65%, imparied relaxation, nl RV size/fxn.  . Transient cerebral ischemia due to atrial fibrillation [facility](HCC)     Past Surgical History:  Procedure Laterality Date  . ABDOMINAL HYSTERECTOMY    . APPENDECTOMY    . BACK SURGERY     x 3, last 2004.  . CHOLECYSTECTOMY    . COLONOSCOPY WITH PROPOFOL N/A 03/20/2017   Procedure: COLONOSCOPY WITH PROPOFOL;  Surgeon: Wyline MoodKiran Anna, MD;  [facility];  Service: Endoscopy;  Laterality: N/A;  . ESOPHAGOGASTRODUODENOSCOPY (EGD) WITH PROPOFOL N/A 03/20/2017   Procedure: ESOPHAGOGASTRODUODENOSCOPY (EGD) WITH PROPOFOL;  Surgeon: Wyline MoodKiran Anna, MD;  Location: [facility] ENDOSCOPY;  Service: Endoscopy;   Laterality: N/A;  . FOOT SURGERY Right   . HEMORRHOID BANDING    . left total hip arthroplasty  2012  . STOMACH SURGERY    . TONSILLECTOMY      There were no vitals filed for this visit.      Subjective Assessment - 10/16/17 1115    Subjective Patient pleasant and eager for therapy   Currently in Pain? No/denies               ADULT SLP TREATMENT - 10/16/17 0001      General Information   Behavior/Cognition Alert;Cooperative;Pleasant mood;Requires cueing   HPI Patient is a 76 y/o woman with left temproal lobe hemorrhage on 09/01/17.     Treatment Provided   Treatment provided Cognitive-Linquistic     Pain Assessment   Pain Assessment No/denies pain     Cognitive-Linquistic Treatment   Treatment focused on Aphasia   Skilled Treatment VERBAL EXPRESSION: The patient is not able to name pictured objects with 40% accuracy and read label to name pictured item with 90% accuracy.  Patient able to read aloud 3-letter words with 80% accuracy, min cues.  Generate intelligible/meaningful phrase to describe simple action picture with 20% accuracy and an additional 40% with intelligible but irrelevant or vague responses.  COMPREHENSION:  Follow 1-step motor commands with 73% accuracy.     Assessment / Recommendations / Plan   Plan Continue with current plan of care  Progression Toward Goals   Progression toward goals Progressing toward goals          SLP Education - 10/16/17 1116    Education provided Yes   Education Details listen   Person(s) Educated Patient   Methods Explanation   Comprehension Verbalized understanding            SLP Long Term Goals - 09/15/17 1329      SLP LONG TERM GOAL #1   Title Patient will complete 2 unit processing tasks with 80% accuracy without the need of repetition of task instructions or significant delays in responding.   Time 8   Period Weeks   Status New   Target Date 11/15/17     SLP LONG TERM GOAL #2   Title Patient  will complete 3 unit processing tasks with 80% accuracy without the need of repetition of task instructions or significant delays in responding.   Time 12   Period Weeks   Status New   Target Date 12/15/17     SLP LONG TERM GOAL #3   Title Patient will name common objects with 50% accuracy.   Time 8   Period Weeks   Status New   Target Date 11/15/17     SLP LONG TERM GOAL #4   Title Patient will name common objects with 80% accuracy.   Time 12   Period Weeks   Status New   Target Date 12/15/17     SLP LONG TERM GOAL #5   Title Patient will generate meaningful phrase to complete simple/concrete linguistic task with 80% accuracy.   Time 12   Period Weeks   Status New   Target Date 12/15/17          Plan - 10/16/17 1116    Clinical Impression Statement The patient is generating more intelligible and appropriate speech as her auditory comprehension is improving.   Speech Therapy Frequency 2x / week   Duration Other (comment)   Treatment/Interventions Language facilitation;Multimodal communcation approach;SLP instruction and feedback;Patient/family education   Potential to Achieve Goals Good   Potential Considerations Ability to learn/carryover information;Co-morbidities;Cooperation/participation level;Medical prognosis;Pain level;Previous level of function;Severity of impairments;Family/community support   SLP Home Exercise Plan read aloud 3- and 4-letter words   Consulted and Agree with Plan of Care Patient      Patient will benefit from skilled therapeutic intervention in order to improve the following deficits and impairments:   Aphasia    Problem List Patient Active Problem List   Diagnosis Date Noted  . Gait disturbance, post-stroke 09/04/2017  . Wernicke's aphasia 09/04/2017  . 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 However, needs to rule out CAA, tumor or CVT (vein  of labbe)  09/01/2017  . Constipation 01/06/2017  . DJD (degenerative joint disease) of knee 07/18/2016  . GIB (gastrointestinal bleeding) 05/25/2016  . GI bleed 05/24/2016  . Degenerative joint disease (DJD) of hip 05/08/2016  . 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 Hypertensive heart disease   . Hemorrhage of gastrointestinal tract 11/13/2015  . Lumbosacral facet joint syndrome 07/26/2015  . Angioedema 07/13/2015  . DM2 (diabetes mellitus, type 2) (HCC) 07/13/2015  . Status post lumbar laminectomy 06/19/2015  . Lumbar radiculopathy 06/19/2015  . DDD (degenerative disc disease), lumbar 05/11/2015  . Facet syndrome, lumbar 05/11/2015  . Lumbar post-laminectomy syndrome 05/11/2015  . Sacroiliac joint disease 05/11/2015  . Spinal stenosis, lumbar region, with neurogenic  claudication 05/11/2015  . Neuropathy due to secondary diabetes (HCC) 05/11/2015  . Essential hypertension 01/03/2015  . Coronary atherosclerosis of native coronary artery   . PAF (paroxysmal atrial fibrillation) (HCC) 10/20/2014  . Ischemic colitis (HCC) 10/20/2014  . Diabetes mellitus type 2 with complications (HCC) 10/20/2014  . COPD (chronic obstructive pulmonary disease) (HCC) 10/20/2014  . Hyperlipidemia 10/20/2014  . GERD (gastroesophageal reflux disease) 10/20/2014  . Ingrowing nail, right great toe 04/27/2014  . Internal hemorrhoid, bleeding 07/14/2013   Dollene Primrose, MS/CCC- SLP  Leandrew Koyanagi 10/16/2017, 11:18 AM  [facility] MAIN [facility] SERVICES [address] [place], [place], [address] Phone: [phone]   Fax:  [phone]   Name: Yvette Collier MRN: [id number] Date of Birth: 04/13/1941

## 2017-10-17 ENCOUNTER — Ambulatory Visit: Payer: Medicare Other

## 2017-10-17 ENCOUNTER — Ambulatory Visit: Payer: Medicare Other | Admitting: Occupational Therapy

## 2017-10-17 ENCOUNTER — Encounter: Payer: Medicare Other | Admitting: Speech Pathology

## 2017-10-17 DIAGNOSIS — M6281 Muscle weakness (generalized): Secondary | ICD-10-CM

## 2017-10-17 DIAGNOSIS — R278 Other lack of coordination: Secondary | ICD-10-CM

## 2017-10-17 DIAGNOSIS — R4701 Aphasia: Secondary | ICD-10-CM | POA: Diagnosis not present

## 2017-10-17 DIAGNOSIS — R2681 Unsteadiness on feet: Secondary | ICD-10-CM

## 2017-10-17 NOTE — Therapy (Signed)
[facility] [address] , [place], [address] Phone: [phone]   Fax:  [phone]  Physical Therapy Treatment  Patient Details  Name: Yvette Collier MRN: [id number] Date of Birth: [birth year] Referring Provider: BURNS, HARRIETT P  Encounter Date: 10/17/2017      PT End of Session - 10/17/17 0919    Visit Number 6   Number of Visits 25   Date for PT Re-Evaluation 12/08/17   Authorization Type g code 6/10   PT Start Time 0830   PT Stop Time 0915   PT Time Calculation (min) 45 min   Equipment Utilized During Treatment Gait belt   Activity Tolerance Patient tolerated treatment well;Other (comment)  Easily distracted   Behavior During Therapy [facility] for tasks assessed/performed      Past Medical History:  Diagnosis Date  . Asthma   . Chronic lower back pain   . COPD (chronic obstructive pulmonary disease) (HCC)   . Diabetes mellitus without complication (HCC)   . Gastrointestinal bleed   . GERD (gastroesophageal reflux disease)   . History of colon polyps   . Hypertension   . Hypertensive heart disease   . IBS (irritable bowel syndrome)   . Ischemic colitis (HCC)   . Non-obstructive Coronary Artery Disease    a. 05/2009 [facility]): minimal nonobs dzs.  . Osteoarthritis   . PAF (paroxysmal atrial fibrillation) (HCC)    a. 09/2014 during GI illness;  b. CHA2DS2VASc = 5 (not currently on OAC 2/2 h/o GIB/ischemic colitis); c. 09/2014 Echo: EF 60-65%, imparied relaxation, nl RV size/fxn.  . Transient cerebral ischemia due to atrial fibrillation [facility](HCC)     Past Surgical History:  Procedure Laterality Date  . ABDOMINAL HYSTERECTOMY    . APPENDECTOMY    . BACK SURGERY     x 3, last 2004.  . CHOLECYSTECTOMY    . COLONOSCOPY WITH PROPOFOL N/A 03/20/2017   Procedure: COLONOSCOPY WITH PROPOFOL;  Surgeon: Wyline MoodKiran Anna, MD;  [facility];  Service: Endoscopy;  Laterality: N/A;  . ESOPHAGOGASTRODUODENOSCOPY  (EGD) WITH PROPOFOL N/A 03/20/2017   Procedure: ESOPHAGOGASTRODUODENOSCOPY (EGD) WITH PROPOFOL;  Surgeon: Wyline MoodKiran Anna, MD;  Location: [facility] ENDOSCOPY;  Service: Endoscopy;  Laterality: N/A;  . FOOT SURGERY Right   . HEMORRHOID BANDING    . left total hip arthroplasty  2012  . STOMACH SURGERY    . TONSILLECTOMY      There were no vitals filed for this visit.      Subjective Assessment - 10/17/17 0836    Subjective Patient is having a good day for pain in her back today.  Had a stumble this morning but no fall.    Pertinent History Yvette Collier a 76 y.o.right handed femalewith history of hypertension,[name],diabetes mellitus, COPD. Patient lives alone independent prior to admission and works as a Education officer, environmentalpastor. One level home. Admitted 09/01/2017 with altered mental statusright side weaknessas well as aphasia. CT of the head showed a large acute left temporal lobe hematoma approximately 15.9 mL suspect hypertensive hemorrhage.Pt presents today with fluent aphasia.   Limitations Walking   How long can you sit comfortably? 30 minutes before needing to shift positions   How long can you stand comfortably? about 30 minutes before needing to sit and rest   How long can you walk comfortably? states that she has no issues with walking   Patient Stated Goals "get stronger"   Currently in Pain?  No/denies         Seated marches x 30 B LE with 3# ankle weights , mirror in front for upright posture.    LAQ x 20 B LE with 3# ankle weights, seated EOB to hand for visual cues.    SLR x 20 B LE with hand as visual cue for heigh, cues for keeping legs straight. LLE challenging to patient     Bridges x 2 x10    Sit to stand with 3lb weight UE raises upon stand 2x10 with PT demonstrating next to patient for visual cueing.    Walking marches in // bars 4x with decreased UE support    Neuro Re-ed Stand on foam pad writing X's on wall without UE support,  reaching outside of BOS x 3 minutes, CGA   airex pad: Boll toss standing 20x no LOB no UE support   Step over and back over orange hurdle no UE support 15x each leg, occasionally knocking over hurdle when stepping over with LLE.    Pt requires frequent visual, verbal and tactile cues in order to complete tasks today. Performance improved when PT performed task with patient.                            PT Education - 10/17/17 [phone]    Education provided Yes   Education Details correct body mechanics for exercises   Person(s) Educated Patient   Methods Explanation;Demonstration;Verbal cues   Comprehension Verbalized understanding;Returned demonstration          PT Short Term Goals - 10/15/17 1036      PT SHORT TERM GOAL #1   Title Pt will improve 5x sit to stand to 16 seconds in order to demonstrate improved LE strength and improved dynamic balance to decrease risk for falls.   Baseline 18 sec, 10/15/17: 21.41 s   Time 6   Period Weeks   Status On-going   Target Date 10/27/17     PT SHORT TERM GOAL #2   Title Pt will improve gait speed to at least 0.8 m/s, demonstrating improved LE strength in order to safely ambulate with decreased risk for fals.   Baseline 0.688 m/s, 10/15/17: 1.0 m/s   Time 6   Period Weeks   Status Achieved   Target Date 10/27/17     PT SHORT TERM GOAL #3   Title Pt will improve TUG to at least 16 seconds in order to demonstrate improve LE strength and dynamic balance.   Baseline 19 sec; 10/15/17: 11.23s   Time 6   Period Weeks   Status Achieved     PT SHORT TERM GOAL #4   Title Pt will improve Berg Balance score to 46/56 in order to demonstarte improved dynamic and static balance in order to decrease overall falls risk.   Baseline 41/56; 51/56   Time 6   Period Weeks   Status Achieved           PT Long Term Goals - 10/15/17 1036      PT LONG TERM GOAL #1   Title Pt will demonstrate independence with HEP in order to  continue LE strengthening and balance interventions at home to manage symptoms and decrease overall falls risk.   Time 12   Period Weeks   Status On-going   Target Date 12/08/17     PT LONG TERM GOAL #2   Title Pt will improve 5x sit to stand to <  15 seconds in order to demonstrate improved LE strength and improved balance in order to decrease falls risk.   Baseline 18 sec; 10/15/17: 21.41s   Time 12   Period Weeks   Status On-going   Target Date 12/08/17     PT LONG TERM GOAL #3   Title Pt will improve gait speed to at least 1.0 m/s in order to demonstrate improved LE strength and balance in order to ambulate with decreased risk for falls.   Baseline 0.688 m/s; 10/15/17: 1.0 m/s   Time 12   Period Weeks   Status Achieved   Target Date 12/08/17     PT LONG TERM GOAL #4   Title Pt will demonstrate an improved TUG score to < or equal to 14 seconds in order to demonstrate improved LE strength and balance in order to decrease overall falls risk.   Baseline 19 sec; 10/15/17: 11.23s   Time 12   Period Weeks   Status Achieved   Target Date 12/08/17     PT LONG TERM GOAL #5   Title Pt will demonstrate an improved Berg Balance score to at least 51/56 in order to demonstrate improved dynamic and static balance activities.   Baseline 41/56; 10/15/17: 51/56   Time 12   Period Weeks   Status Achieved   Target Date 12/08/17     Additional Long Term Goals   Additional Long Term Goals Yes     PT LONG TERM GOAL #6   Title Pt will improve LE strength to 4+/5 in all limited planes in order to increase funtional abilities and improve gait.   Baseline Gross strength assessment: 3+/5, L hip ext 2+/5, R hip ext 3-/5; 10/15/17: hip extension 3/5 bilaterally, hip abduction/adduction: 3+/5 on R side, 4-/5 on L side   Time 12   Period Weeks   Status On-going   Target Date 12/08/17     PT LONG TERM GOAL #7   Title Pt will improve gait speed to at least 1.2 m/s in order to demonstrate improved LE  strength and balance in order to ambulate with decreased risk for falls.   Baseline 0.688 m/s; 10/15/17: 1.0 m/s   Time 8   Period Weeks   Status New   Target Date 12/08/17     PT LONG TERM GOAL #8   Title Pt will demonstrate an improved Berg Balance score to at least 54/56 in order to demonstrate improved dynamic and static balance activities.   Baseline 41/56; 10/15/17: 51/56   Time 8   Period Weeks   Status New   Target Date 12/08/17               Plan - 10/17/17 0920    Clinical Impression Statement Patient demonstrated ability to perform short term dynamic balance on airex pad reaching across midline without LOB. When patient loses focus on task, more frequent LOB occurs, however when orientated to task patient remains stable. Fatigue in bilateral quadriceps noted with repetitive sit to stand motions. Performing tasks alongside patient allowed for decreased need for verbal cueing and orientation cues. Patient will continue to benefit from skilled physical therapy to improve strength, gait mechanics, and QOL.    Rehab Potential Good   PT Frequency 2x / week   PT Duration 12 weeks   PT Treatment/Interventions Aquatic Therapy;Cryotherapy;Moist Heat;Gait training;Stair training;Functional mobility training;Therapeutic activities;Therapeutic exercise;Balance training;Neuromuscular re-education;Patient/family education;Manual techniques;Passive range of motion   PT Next Visit Plan begin LE strength and dynamic balance activities  PT Home Exercise Plan seated marches, seated hip ABD with red theraband   Consulted and Agree with Plan of Care Patient      Patient will benefit from skilled therapeutic intervention in order to improve the following deficits and impairments:  Abnormal gait, Decreased coordination, Decreased activity tolerance, Decreased balance, Decreased cognition, Decreased mobility, Decreased safety awareness, Decreased strength, Difficulty walking  Visit  Diagnosis: Muscle weakness (generalized)  Unsteadiness on feet     Problem List Patient Active Problem List   Diagnosis Date Noted  . Gait disturbance, post-stroke 09/04/2017  . Wernicke's aphasia 09/04/2017  . 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 However, needs to rule out CAA, tumor or CVT (vein of labbe)  09/01/2017  . Constipation 01/06/2017  . DJD (degenerative joint disease) of knee 07/18/2016  . GIB (gastrointestinal bleeding) 05/25/2016  . GI bleed 05/24/2016  . Degenerative joint disease (DJD) of hip 05/08/2016  . 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 Hypertensive heart disease   . Hemorrhage of gastrointestinal tract 11/13/2015  . Lumbosacral facet joint syndrome 07/26/2015  . Angioedema 07/13/2015  . DM2 (diabetes mellitus, type 2) (HCC) 07/13/2015  . Status post lumbar laminectomy 06/19/2015  . Lumbar radiculopathy 06/19/2015  . DDD (degenerative disc disease), lumbar 05/11/2015  . Facet syndrome, lumbar 05/11/2015  . Lumbar post-laminectomy syndrome 05/11/2015  . Sacroiliac joint disease 05/11/2015  . 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 Neuropathy due to secondary diabetes (HCC) 05/11/2015  . Essential hypertension 01/03/2015  . Coronary atherosclerosis of native coronary artery   . PAF (paroxysmal atrial fibrillation) (HCC) 10/20/2014  . Ischemic colitis (HCC) 10/20/2014  . Diabetes mellitus type 2 with complications (HCC) 10/20/2014  . COPD (chronic obstructive pulmonary disease) (HCC) 10/20/2014  . Hyperlipidemia 10/20/2014  . GERD (gastroesophageal reflux disease) 10/20/2014  . Ingrowing nail, right great toe 04/27/2014  . Internal hemorrhoid, bleeding 07/14/2013   Precious Bard, PT, DPT   Precious Bard 10/17/2017, 9:22 AM  [place] [facility] MAIN [facility] SERVICES [address]  [place], [place], [address] Phone: [phone]   Fax:  [phone]  Name: Yvette Collier MRN: [id number] Date of Birth: May 27, 1941

## 2017-10-17 NOTE — Therapy (Signed)
[facility] [facility] MAIN [facility] SERVICES [address] [place], [place], [address] Phone: [phone]   Fax:  [phone]  Occupational Therapy Treatment  Patient Details  Name: Yvette Collier MRN: [id number] Date of Birth: 28-Dec-1940 Referring Provider: Dr. Wynn Banker  Encounter Date: 10/17/2017      OT End of Session - 10/17/17 0918    Visit Number 8   Number of Visits 24   Date for OT Re-Evaluation 12/08/17   OT Start Time 0915   OT Stop Time 1000   OT Time Calculation (min) 45 min   Activity Tolerance Patient tolerated treatment well   Behavior During Therapy [facility] for tasks assessed/performed      Past Medical History:  Diagnosis Date  . Asthma   . Chronic lower back pain   . COPD (chronic obstructive pulmonary disease) (HCC)   . Diabetes mellitus without complication (HCC)   . Gastrointestinal bleed   . GERD (gastroesophageal reflux disease)   . History of colon polyps   . Hypertension   . Hypertensive heart disease   . IBS (irritable bowel syndrome)   . Ischemic colitis (HCC)   . Non-obstructive Coronary Artery Disease    a. 05/2009 [facility]): minimal nonobs dzs.  . Osteoarthritis   . PAF (paroxysmal atrial fibrillation) (HCC)    a. 09/2014 during GI illness;  b. CHA2DS2VASc = 5 (not currently on OAC 2/2 h/o GIB/ischemic colitis); c. 09/2014 Echo: EF 60-65%, imparied relaxation, nl RV size/fxn.  . Transient cerebral ischemia due to atrial fibrillation [facility])     Past Surgical History:  Procedure Laterality Date  . ABDOMINAL HYSTERECTOMY    . APPENDECTOMY    . BACK SURGERY     x 3, last 2004.  . CHOLECYSTECTOMY    . COLONOSCOPY WITH PROPOFOL N/A 03/20/2017   Procedure: COLONOSCOPY WITH PROPOFOL;  Surgeon: Wyline Mood, MD;  Location[facility] ENDOSCOPY;  Service: Endoscopy;  Laterality: N/A;  . ESOPHAGOGASTRODUODENOSCOPY (EGD) WITH PROPOFOL N/A 03/20/2017   Procedure: ESOPHAGOGASTRODUODENOSCOPY (EGD) WITH PROPOFOL;  Surgeon: Wyline Mood,  MD;  Location: [facility] ENDOSCOPY;  Service: Endoscopy;  Laterality: N/A;  . FOOT SURGERY Right   . HEMORRHOID BANDING    . left total hip arthroplasty  2012  . STOMACH SURGERY    . TONSILLECTOMY      There were no vitals filed for this visit.      Subjective Assessment - 10/17/17 0917    Subjective  Pt. reports her back is feeling better   Patient is accompained by: Family member   Pertinent History Pt. is a 76 y.o. female who suffered a Left temporla Lobe Hemorrhage secondary to Hypertensive Crisis, Moderate ICH, Left Frontal Trace SDH, and Traumatic ICH secondary to a fall. Pt. PMHX includes: HTN, DM with peripheral Neuropathy, Acute Kidney Injury, Hyperlipdidemia, and history of a GI Bleed. Pt. went the inpatient rehab at [facility], was discharged, and now is ready for outpatient rehab services.   Currently in Pain? No/denies       OT TREATMENT    Neuro muscular re-education:  Pt. performed [facility] tasks using the Grooved pegboard. Pt. worked on grasping the grooved pegs from a horizontal position, and moving the pegs to a vertical position in the hand to prepare for placing them in the grooved slot. Pt. required increased time, and cues for the position of the peg.  Therapeutic Exercise:  Pt. performed 2# dowel ex. For UE strengthening secondary to weakness. Bilateral shoulder flexion, chest press, circular patterns,  and elbow flexion/extension were performed. 2# dumbbell ex. for elbow flexion and extension, forearm supination/pronation, wrist flexion/extension, and radial deviation. Pt. requires consistent verbal cues for proper technique.                          OT Education - 10/17/17 [phone]    Education provided Yes   Education Details UE functioning   Person(s) Educated Patient   Methods Explanation;Demonstration;Verbal cues   Comprehension Verbalized understanding;Returned demonstration                 Plan - 10/17/17 0919    Clinical  Impression Statement Pt. reports she is trying to do the dishes at home. Pt. reports her family doesn't want her to because they don't want her to fall. Pt. continues to present with cognitive limitations, limited strength, and coordination. Pt. continues to require cues for proper technique, however required less cuing today.  Pt. continues to work with OT services to improve ADL, and IADL functioning.   Occupational performance deficits (Please refer to evaluation for details): ADL's;IADL's   Rehab Potential Good   OT Frequency 2x / week   OT Duration 12 weeks   OT Treatment/Interventions Self-care/ADL training;Therapeutic exercise;DME and/or AE instruction;Patient/family education;Therapeutic activities;Therapeutic exercises;Energy conservation   Consulted and Agree with Plan of Care Patient      Patient will benefit from skilled therapeutic intervention in order to improve the following deficits and impairments:  Abnormal gait, Decreased activity tolerance, Decreased cognition, Decreased balance, Decreased strength, Impaired UE functional use  Visit Diagnosis: Other lack of coordination  Muscle weakness (generalized)    Problem List Patient Active Problem List   Diagnosis Date Noted  . Gait disturbance, post-stroke 09/04/2017  . Wernicke's aphasia 09/04/2017  . 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 However, needs to rule out CAA, tumor or CVT (vein of labbe)  09/01/2017  . Constipation 01/06/2017  . DJD (degenerative joint disease) of knee 07/18/2016  . GIB (gastrointestinal bleeding) 05/25/2016  . GI bleed 05/24/2016  . Degenerative joint disease (DJD) of hip 05/08/2016  . 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 Hypertensive heart disease   . Hemorrhage of gastrointestinal tract 11/13/2015  . Lumbosacral facet joint syndrome 07/26/2015  . Angioedema 07/13/2015  . DM2 (diabetes  mellitus, type 2) (HCC) 07/13/2015  . Status post lumbar laminectomy 06/19/2015  . Lumbar radiculopathy 06/19/2015  . DDD (degenerative disc disease), lumbar 05/11/2015  . Facet syndrome, lumbar 05/11/2015  . Lumbar post-laminectomy syndrome 05/11/2015  . Sacroiliac joint disease 05/11/2015  . 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 Neuropathy due to secondary diabetes (HCC) 05/11/2015  . Essential hypertension 01/03/2015  . Coronary atherosclerosis of native coronary artery   . PAF (paroxysmal atrial fibrillation) (HCC) 10/20/2014  . Ischemic colitis (HCC) 10/20/2014  . Diabetes mellitus type 2 with complications (HCC) 10/20/2014  . COPD (chronic obstructive pulmonary disease) (HCC) 10/20/2014  . Hyperlipidemia 10/20/2014  . GERD (gastroesophageal reflux disease) 10/20/2014  . Ingrowing nail, right great toe 04/27/2014  . Internal hemorrhoid, bleeding 07/14/2013    Olegario MessierElaine Kamren Heskett, MS, OTR/L 10/17/2017, 9:32 AM  [place] [facility] [address] [place], [place], [address] Phone: [phone]   Fax:  [phone]  Name: Tomma RakersMary L Revolorio MRN: [id number] Date of Birth: 1941/07/23

## 2017-10-20 ENCOUNTER — Ambulatory Visit: Payer: Medicare Other | Admitting: Physical Therapy

## 2017-10-20 ENCOUNTER — Ambulatory Visit: Payer: Medicare Other | Admitting: Occupational Therapy

## 2017-10-20 ENCOUNTER — Encounter: Payer: Self-pay | Admitting: Physical Therapy

## 2017-10-20 DIAGNOSIS — R4701 Aphasia: Secondary | ICD-10-CM | POA: Diagnosis not present

## 2017-10-20 DIAGNOSIS — M6281 Muscle weakness (generalized): Secondary | ICD-10-CM

## 2017-10-20 DIAGNOSIS — I611 Nontraumatic intracerebral hemorrhage in hemisphere, cortical: Secondary | ICD-10-CM

## 2017-10-20 DIAGNOSIS — R2681 Unsteadiness on feet: Secondary | ICD-10-CM

## 2017-10-20 DIAGNOSIS — I69315 Cognitive social or emotional deficit following cerebral infarction: Secondary | ICD-10-CM

## 2017-10-20 DIAGNOSIS — R278 Other lack of coordination: Secondary | ICD-10-CM

## 2017-10-20 NOTE — Therapy (Signed)
[facility] MAIN [facility] [address] [place], [place], [address] Phone: [phone]   Fax:  [phone]  Occupational Therapy Treatment  Patient Details  Name: Yvette RakersMary L Collier MRN: [id number] Date of Birth: 02/06/1941 Referring Provider: Dr. Wynn BankerKirsteins  Encounter Date: 10/20/2017      OT End of Session - 10/20/17 1352    Visit Number 9   Number of Visits 24   Date for OT Re-Evaluation 12/08/17   OT Start Time 1350   OT Stop Time 1430   OT Time Calculation (min) 40 min   Activity Tolerance Patient tolerated treatment well   Behavior During Therapy [facility] for tasks assessed/performed      Past Medical History:  Diagnosis Date  . Asthma   . Chronic lower back pain   . COPD (chronic obstructive pulmonary disease) (HCC)   . Diabetes mellitus without complication (HCC)   . Gastrointestinal bleed   . GERD (gastroesophageal reflux disease)   . History of colon polyps   . Hypertension   . Hypertensive heart disease   . IBS (irritable bowel syndrome)   . Ischemic colitis (HCC)   . Non-obstructive Coronary Artery Disease    a. 05/2009 Cath [facility]): minimal nonobs dzs.  . Osteoarthritis   . PAF (paroxysmal atrial fibrillation) (HCC)    a. 09/2014 during GI illness;  b. CHA2DS2VASc = 5 (not currently on OAC 2/2 h/o GIB/ischemic colitis); c. 09/2014 Echo: EF 60-65%, imparied relaxation, nl RV size/fxn.  . Transient cerebral ischemia due to atrial fibrillation [facility](HCC)     Past Surgical History:  Procedure Laterality Date  . ABDOMINAL HYSTERECTOMY    . APPENDECTOMY    . BACK SURGERY     x 3, last 2004.  . CHOLECYSTECTOMY    . COLONOSCOPY WITH PROPOFOL N/A 03/20/2017   Procedure: COLONOSCOPY WITH PROPOFOL;  Surgeon: Wyline MoodKiran Anna, MD;  [facility];  Service: Endoscopy;  Laterality: N/A;  . ESOPHAGOGASTRODUODENOSCOPY (EGD) WITH PROPOFOL N/A 03/20/2017   Procedure: ESOPHAGOGASTRODUODENOSCOPY (EGD) WITH PROPOFOL;  Surgeon: Wyline MoodKiran Anna,  MD;  Location: [facility] ENDOSCOPY;  Service: Endoscopy;  Laterality: N/A;  . FOOT SURGERY Right   . HEMORRHOID BANDING    . left total hip arthroplasty  2012  . STOMACH SURGERY    . TONSILLECTOMY      There were no vitals filed for this visit.      Subjective Assessment - 10/20/17 1349    Subjective  Pt. reports her back is feeling better.   Patient is accompained by: Family member   Pertinent History Pt. is a 76 y.o. female who suffered a Left temporla Lobe Hemorrhage secondary to Hypertensive Crisis, Moderate ICH, Left Frontal Trace SDH, and Traumatic ICH secondary to a fall. Pt. PMHX includes: HTN, DM with peripheral Neuropathy, Acute Kidney Injury, Hyperlipdidemia, and history of a GI Bleed. Pt. [facility], was discharged, and now is ready for outpatient rehab services.   Currently in Pain? No/denies   Pain Score 6    Pain Location Back   Pain Orientation Lower   Pain Descriptors / Indicators Aching   Pain Type Chronic pain      OT TREATMENT    Neuro muscular re-education:  Pt. performed [facility] skills training to improve speed and dexterity needed for ADL tasks and writing. Pt. demonstrated grasping 1 inch sticks,  inch cylindrical collars, and  inch flat washers on the Purdue pegboard. Pt. performed grasping each item with her 2nd digit and  thumb, and sequencing a pattern. Pt. required verbal cues, and assist. Pt. Requires cues for assist, for sequencing.  Therapeutic Exercise:  Pt. performed 2# dowel ex. For UE strengthening secondary to weakness. Bilateral shoulder flexion, chest press, circular patterns, and elbow flexion/extension were performed. 2# dumbbell ex. for elbow flexion and extension, forearm supination/pronation, wrist flexion/extension, and radial deviation. Pt. requires rest breaks and verbal cues for proper technique.                              OT Education - 10/20/17 1351    Education provided Yes    Education Details UE strength, and coordination skills   Person(s) Educated Patient   Methods Explanation;Demonstration;Verbal cues   Comprehension Verbalized understanding;Returned demonstration             OT Long Term Goals - 09/15/17 1208      OT LONG TERM GOAL #1   Title Pt. will be independent with basic ADL care needs.   Baseline Eval: Pt. requires assist   Time 12   Period Weeks   Status New   Target Date 12/08/17     OT LONG TERM GOAL #2   Title Pt. will be independent with light meal preparation.   Baseline Eval: Pt. is unable   Time 12   Period Weeks   Status New   Target Date 12/08/17     OT LONG TERM GOAL #3   Title Pt. will be independent with tub/shower transfers   Baseline Eval: Pt. requires assist   Time 12   Period Weeks   Status New   Target Date 12/08/17     OT LONG TERM GOAL #4   Title Pt. will increase BUE strength by 2 mm grades to assist with ADLs.   Baseline Eval: BUE strength 4/5 overall   Time 12   Period Weeks   Status New   Target Date 12/08/17     OT LONG TERM GOAL #5   Title Pt. will improve right [facility] skills to be able to assist with buttoning, and zipping.    Baseline Eval: impaired   Time 12   Period Weeks   Status New   Target Date 12/08/17     OT LONG TERM GOAL #6   Title Pt. will improve right grip strength to be able to open containers.   Baseline Eval: Limited.   Time 12   Period Weeks   Target Date 12/08/17     OT LONG TERM GOAL #7   Title Pt. will identify potential safety hazards accurately during ADLs, and IADLs with 100% accuracy.   Baseline Eval: limited   Time 48   Status New   Target Date 12/08/17     OT LONG TERM GOAL #8   Title Pt. will demonstrate cognitive compensatory techniques 100% of the time with minimal cues.   Baseline Eval: limited   Time 12   Period Weeks   Status New   Target Date 12/08/17               Plan - 10/20/17 1407    Clinical Impression Statement Pt. continues to  present with cognitive impairments, weakness, limited UE strength, and impaired coordination skills. Pt. reports  having had pain earlier in the day rated a 6/10 using the Wong-Baker Facial Grimmace Scale.  Pt. conitnues to work on improving UE functioning for improved ADLs, and IADLs.   Occupational performance deficits (Please  refer to evaluation for details): ADL's;IADL's   Rehab Potential Good   OT Frequency 2x / week   OT Duration 12 weeks   OT Treatment/Interventions Self-care/ADL training;Therapeutic exercise;DME and/or AE instruction;Patient/family education;Therapeutic activities;Therapeutic exercises;Energy conservation   Consulted and Agree with Plan of Care Patient      Patient will benefit from skilled therapeutic intervention in order to improve the following deficits and impairments:  Abnormal gait, Decreased activity tolerance, Decreased cognition, Decreased balance, Decreased strength, Impaired UE functional use  Visit Diagnosis: Muscle weakness (generalized)  Other lack of coordination    Problem List Patient Active Problem List   Diagnosis Date Noted  . Gait disturbance, post-stroke 09/04/2017  . Wernicke's aphasia 09/04/2017  . 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 However, needs to rule out CAA, tumor or CVT (vein of labbe)  09/01/2017  . Constipation 01/06/2017  . DJD (degenerative joint disease) of knee 07/18/2016  . GIB (gastrointestinal bleeding) 05/25/2016  . GI bleed 05/24/2016  . Degenerative joint disease (DJD) of hip 05/08/2016  . 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 Hypertensive heart disease   . Hemorrhage of gastrointestinal tract 11/13/2015  . Lumbosacral facet joint syndrome 07/26/2015  . Angioedema 07/13/2015  . DM2 (diabetes mellitus, type 2) (HCC) 07/13/2015  . Status post lumbar laminectomy 06/19/2015  . Lumbar radiculopathy 06/19/2015  . DDD  (degenerative disc disease), lumbar 05/11/2015  . Facet syndrome, lumbar 05/11/2015  . Lumbar post-laminectomy syndrome 05/11/2015  . Sacroiliac joint disease 05/11/2015  . 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 Neuropathy due to secondary diabetes (HCC) 05/11/2015  . Essential hypertension 01/03/2015  . Coronary atherosclerosis of native coronary artery   . PAF (paroxysmal atrial fibrillation) (HCC) 10/20/2014  . Ischemic colitis (HCC) 10/20/2014  . Diabetes mellitus type 2 with complications (HCC) 10/20/2014  . COPD (chronic obstructive pulmonary disease) (HCC) 10/20/2014  . Hyperlipidemia 10/20/2014  . GERD (gastroesophageal reflux disease) 10/20/2014  . Ingrowing nail, right great toe 04/27/2014  . Internal hemorrhoid, bleeding 07/14/2013    Olegario Messier, MS, OTR/L 10/20/2017, 2:20 PM  [facility] MAIN [facility] SERVICES [address] [place], [place], [address] Phone: [phone]   Fax:  [phone]  Name: Yvette Collier MRN: [id number] Date of Birth: May 04, 1941

## 2017-10-20 NOTE — Therapy (Signed)
[facility] MAIN [facility] SERVICES [address] [place], [place], [address] Phone: [phone]   Fax:  [phone]  Physical Therapy Treatment  Patient Details  Name: Yvette Collier MRN: [id number] Date of Birth: 04-17-1941 Referring Provider: BURNS, HARRIETT P  Encounter Date: 10/20/2017      PT End of Session - 10/20/17 1307    Visit Number 7   Number of Visits 25   Date for PT Re-Evaluation 12/08/17   Authorization Type g code 7/10   PT Start Time 0104   PT Stop Time 0145   PT Time Calculation (min) 41 min   Equipment Utilized During Treatment Gait belt   Activity Tolerance Patient tolerated treatment well;Other (comment)  Easily distracted   Behavior During Therapy [facility] for tasks assessed/performed      Past Medical History:  Diagnosis Date  . Asthma   . Chronic lower back pain   . COPD (chronic obstructive pulmonary disease) (HCC)   . Diabetes mellitus without complication (HCC)   . Gastrointestinal bleed   . GERD (gastroesophageal reflux disease)   . History of colon polyps   . Hypertension   . Hypertensive heart disease   . IBS (irritable bowel syndrome)   . Ischemic colitis (HCC)   . Non-obstructive Coronary Artery Disease    a. 05/2009 Cath [facility]): minimal nonobs dzs.  . Osteoarthritis   . PAF (paroxysmal atrial fibrillation) (HCC)    a. 09/2014 during GI illness;  b. CHA2DS2VASc = 5 (not currently on OAC 2/2 h/o GIB/ischemic colitis); c. 09/2014 Echo: EF 60-65%, imparied relaxation, nl RV size/fxn.  . Transient cerebral ischemia due to atrial fibrillation [facility])     Past Surgical History:  Procedure Laterality Date  . ABDOMINAL HYSTERECTOMY    . APPENDECTOMY    . BACK SURGERY     x 3, last 2004.  . CHOLECYSTECTOMY    . COLONOSCOPY WITH PROPOFOL N/A 03/20/2017   Procedure: COLONOSCOPY WITH PROPOFOL;  Surgeon: Wyline Mood, MD;  [facility]COPY;  Service: Endoscopy;  Laterality: N/A;  . ESOPHAGOGASTRODUODENOSCOPY  (EGD) WITH PROPOFOL N/A 03/20/2017   Procedure: ESOPHAGOGASTRODUODENOSCOPY (EGD) WITH PROPOFOL;  Surgeon: Wyline Mood, MD;  Location: [facility] ENDOSCOPY;  Service: Endoscopy;  Laterality: N/A;  . FOOT SURGERY Right   . HEMORRHOID BANDING    . left total hip arthroplasty  2012  . STOMACH SURGERY    . TONSILLECTOMY      There were no vitals filed for this visit.      Subjective Assessment - 10/20/17 1306    Subjective Patient is having a good day for pain in her back today.    Pertinent History Yvette Jernberg Whiteis a 76 y.o.right handed femalewith history of hypertension,[name],diabetes mellitus, COPD. Patient lives alone independent prior to admission and works as a Education officer, environmental. One level home. Admitted 09/01/2017 with altered mental statusright side weaknessas well as aphasia. CT of the head showed a large acute left temporal lobe hematoma approximately 15.9 mL suspect hypertensive hemorrhage.Pt presents today with fluent aphasia.   Limitations Walking   How long can you sit comfortably? 30 minutes before needing to shift positions   How long can you stand comfortably? about 30 minutes before needing to sit and rest   How long can you walk comfortably? states that she has no issues with walking   Patient Stated Goals "get stronger"   Currently in Pain? No/denies   Pain Score 0-No pain  Pain Onset More than a month ago   Multiple Pain Sites No     Treatment:  Hooklying marching with 4 lbs x 20 alternating BLE's   Seated marches x 30 B LE with 3# ankle weights , mirror in front for upright posture.   LAQ x 20 B LE with 4# ankle weights, seated EOB to hand for visual cues.   SLR x 20 B LE with hand as visual cue for heigh, cues for keeping legs straight. LLE challenging to patient    Bridges x 2 x10 , VC for correct exercise technique   Walking marches in // bars 4x with decreased UE support   Neuro Re-ed Stand on foam pad reachng to tap  cards across midline without UE support, reaching outside of BOS x 3 minutes, CGA  airex pad: rod weight BUE above head standing 20x no LOB no UE support  Step over and back over 1/2 foam no UE support 15x each leg, occasionally kicking foam  when stepping over with LLE.   Pt requires frequent visual, verbal and tactile cues in order to complete tasks today. Performance improved when PT performed task with patient.                             PT Education - 10/20/17 1307    Education provided Yes   Education Details HEP   Person(s) Educated Patient   Methods Explanation;Demonstration;Verbal cues   Comprehension Verbalized understanding;Returned demonstration          PT Short Term Goals - 10/15/17 1036      PT SHORT TERM GOAL #1   Title Pt will improve 5x sit to stand to 16 seconds in order to demonstrate improved LE strength and improved dynamic balance to decrease risk for falls.   Baseline 18 sec, 10/15/17: 21.41 s   Time 6   Period Weeks   Status On-going   Target Date 10/27/17     PT SHORT TERM GOAL #2   Title Pt will improve gait speed to at least 0.8 m/s, demonstrating improved LE strength in order to safely ambulate with decreased risk for fals.   Baseline 0.688 m/s, 10/15/17: 1.0 m/s   Time 6   Period Weeks   Status Achieved   Target Date 10/27/17     PT SHORT TERM GOAL #3   Title Pt will improve TUG to at least 16 seconds in order to demonstrate improve LE strength and dynamic balance.   Baseline 19 sec; 10/15/17: 11.23s   Time 6   Period Weeks   Status Achieved     PT SHORT TERM GOAL #4   Title Pt will improve Berg Balance score to 46/56 in order to demonstarte improved dynamic and static balance in order to decrease overall falls risk.   Baseline 41/56; 51/56   Time 6   Period Weeks   Status Achieved           PT Long Term Goals - 10/15/17 1036      PT LONG TERM GOAL #1   Title Pt will demonstrate independence with HEP  in order to continue LE strengthening and balance interventions at home to manage symptoms and decrease overall falls risk.   Time 12   Period Weeks   Status On-going   Target Date 12/08/17     PT LONG TERM GOAL #2   Title Pt will improve 5x sit to stand to <15 seconds  in order to demonstrate improved LE strength and improved balance in order to decrease falls risk.   Baseline 18 sec; 10/15/17: 21.41s   Time 12   Period Weeks   Status On-going   Target Date 12/08/17     PT LONG TERM GOAL #3   Title Pt will improve gait speed to at least 1.0 m/s in order to demonstrate improved LE strength and balance in order to ambulate with decreased risk for falls.   Baseline 0.688 m/s; 10/15/17: 1.0 m/s   Time 12   Period Weeks   Status Achieved   Target Date 12/08/17     PT LONG TERM GOAL #4   Title Pt will demonstrate an improved TUG score to < or equal to 14 seconds in order to demonstrate improved LE strength and balance in order to decrease overall falls risk.   Baseline 19 sec; 10/15/17: 11.23s   Time 12   Period Weeks   Status Achieved   Target Date 12/08/17     PT LONG TERM GOAL #5   Title Pt will demonstrate an improved Berg Balance score to at least 51/56 in order to demonstrate improved dynamic and static balance activities.   Baseline 41/56; 10/15/17: 51/56   Time 12   Period Weeks   Status Achieved   Target Date 12/08/17     Additional Long Term Goals   Additional Long Term Goals Yes     PT LONG TERM GOAL #6   Title Pt will improve LE strength to 4+/5 in all limited planes in order to increase funtional abilities and improve gait.   Baseline Gross strength assessment: 3+/5, L hip ext 2+/5, R hip ext 3-/5; 10/15/17: hip extension 3/5 bilaterally, hip abduction/adduction: 3+/5 on R side, 4-/5 on L side   Time 12   Period Weeks   Status On-going   Target Date 12/08/17     PT LONG TERM GOAL #7   Title Pt will improve gait speed to at least 1.2 m/s in order to demonstrate  improved LE strength and balance in order to ambulate with decreased risk for falls.   Baseline 0.688 m/s; 10/15/17: 1.0 m/s   Time 8   Period Weeks   Status New   Target Date 12/08/17     PT LONG TERM GOAL #8   Title Pt will demonstrate an improved Berg Balance score to at least 54/56 in order to demonstrate improved dynamic and static balance activities.   Baseline 41/56; 10/15/17: 51/56   Time 8   Period Weeks   Status New   Target Date 12/08/17               Plan - 10/20/17 1308    Clinical Impression Statement Pt was able to perform all exercises today without increased fatigue or symptoms of pain or discomfort. Pt was able to perform all balance and strength exercises, demonstrating improvements in LE strength and stability.  Pt was able to complete dynamic balance exercises, showing ability to reach outside BOS without LOB and improve postural reactions to correct self during activities.  Pt demonstrates decreased ability to follow commands and is easily distracted off task.  Pt requires verbal, visual and tactile cues during exercise in order to complete tasks with proper form and technique, as well as to stay on task.  Pt would continue to benefit from skilled PT services in order to further strengthen LE's, improve static and dynamic balance, and improve coordination in order to increase funtional mobility  and decrease risk of falls   Rehab Potential Good   PT Frequency 2x / week   PT Duration 12 weeks   PT Treatment/Interventions Aquatic Therapy;Cryotherapy;Moist Heat;Gait training;Stair training;Functional mobility training;Therapeutic activities;Therapeutic exercise;Balance training;Neuromuscular re-education;Patient/family education;Manual techniques;Passive range of motion   PT Next Visit Plan begin LE strength and dynamic balance activities   PT Home Exercise Plan seated marches, seated hip ABD with red theraband   Consulted and Agree with Plan of Care Patient       Patient will benefit from skilled therapeutic intervention in order to improve the following deficits and impairments:  Abnormal gait, Decreased coordination, Decreased activity tolerance, Decreased balance, Decreased cognition, Decreased mobility, Decreased safety awareness, Decreased strength, Difficulty walking  Visit Diagnosis: Other lack of coordination  Muscle weakness (generalized)  Unsteadiness on feet  Aphasia  Cognitive social or emotional deficit following cerebral infarction  Left temporal lobe hemorrhage (HCC) -  left temporal moderate ICH and left frontal trace SDH, concerning for traumatic ICH due to fall. However, needs to rule out CAA, tumor or CVT (vein of labbe)      Problem List Patient Active Problem List   Diagnosis Date Noted  . Gait disturbance, post-stroke 09/04/2017  . Wernicke's aphasia 09/04/2017  . 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 However, needs to rule out CAA, tumor or CVT (vein of labbe)  09/01/2017  . Constipation 01/06/2017  . DJD (degenerative joint disease) of knee 07/18/2016  . GIB (gastrointestinal bleeding) 05/25/2016  . GI bleed 05/24/2016  . Degenerative joint disease (DJD) of hip 05/08/2016  . 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 Hypertensive heart disease   . Hemorrhage of gastrointestinal tract 11/13/2015  . Lumbosacral facet joint syndrome 07/26/2015  . Angioedema 07/13/2015  . DM2 (diabetes mellitus, type 2) (HCC) 07/13/2015  . Status post lumbar laminectomy 06/19/2015  . Lumbar radiculopathy 06/19/2015  . DDD (degenerative disc disease), lumbar 05/11/2015  . Facet syndrome, lumbar 05/11/2015  . Lumbar post-laminectomy syndrome 05/11/2015  . Sacroiliac joint disease 05/11/2015  . 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 Neuropathy due to secondary diabetes (HCC) 05/11/2015  . Essential  hypertension 01/03/2015  . Coronary atherosclerosis of native coronary artery   . PAF (paroxysmal atrial fibrillation) (HCC) 10/20/2014  . Ischemic colitis (HCC) 10/20/2014  . Diabetes mellitus type 2 with complications (HCC) 10/20/2014  . COPD (chronic obstructive pulmonary disease) (HCC) 10/20/2014  . Hyperlipidemia 10/20/2014  . GERD (gastroesophageal reflux disease) 10/20/2014  . Ingrowing nail, right great toe 04/27/2014  . Internal hemorrhoid, bleeding 07/14/2013    Ezekiel Ina, [place] DPT 10/20/2017, 1:15 PM  [place] [facility] MAIN [facility] SERVICES [address] [place], [place], [address] Phone: [phone]   Fax:  [phone]  Name: AUBRIEE SZETO MRN: [id number] Date of Birth: 1941/04/21

## 2017-10-22 ENCOUNTER — Ambulatory Visit: Payer: Medicare Other | Admitting: Speech Pathology

## 2017-10-22 ENCOUNTER — Encounter: Payer: Self-pay | Admitting: Speech Pathology

## 2017-10-22 DIAGNOSIS — R4701 Aphasia: Secondary | ICD-10-CM

## 2017-10-22 NOTE — Therapy (Signed)
[facility] MAIN [facility] SERVICES [address] [place], [place], [address] Phone: [phone]   Fax:  [phone]  Speech Language Pathology Treatment  Patient Details  Name: Yvette Collier MRN: [id number] Date of Birth: 01-12-1941 Referring Provider: Dr. Wynn Banker  Encounter Date: 10/22/2017      End of Session - 10/22/17 1614    Visit Number 7   Number of Visits 25   Date for SLP Re-Evaluation 12/15/17   SLP Start Time 0910   SLP Stop Time  1000   SLP Time Calculation (min) 50 min   Activity Tolerance Patient tolerated treatment well      Past Medical History:  Diagnosis Date  . Asthma   . Chronic lower back pain   . COPD (chronic obstructive pulmonary disease) (HCC)   . Diabetes mellitus without complication (HCC)   . Gastrointestinal bleed   . GERD (gastroesophageal reflux disease)   . History of colon polyps   . Hypertension   . Hypertensive heart disease   . IBS (irritable bowel syndrome)   . Ischemic colitis (HCC)   . Non-obstructive Coronary Artery Disease    a. 05/2009 Cath [facility]): minimal nonobs dzs.  . Osteoarthritis   . PAF (paroxysmal atrial fibrillation) (HCC)    a. 09/2014 during GI illness;  b. CHA2DS2VASc = 5 (not currently on OAC 2/2 h/o GIB/ischemic colitis); c. 09/2014 Echo: EF 60-65%, imparied relaxation, nl RV size/fxn.  . Transient cerebral ischemia due to atrial fibrillation [facility])     Past Surgical History:  Procedure Laterality Date  . ABDOMINAL HYSTERECTOMY    . APPENDECTOMY    . BACK SURGERY     x 3, last 2004.  . CHOLECYSTECTOMY    . COLONOSCOPY WITH PROPOFOL N/A 03/20/2017   Procedure: COLONOSCOPY WITH PROPOFOL;  Surgeon: Wyline Mood, MD;  Location: [facility] ENDOSCOPY;  Service: Endoscopy;  Laterality: N/A;  . ESOPHAGOGASTRODUODENOSCOPY (EGD) WITH PROPOFOL N/A 03/20/2017   Procedure: ESOPHAGOGASTRODUODENOSCOPY (EGD) WITH PROPOFOL;  Surgeon: Wyline Mood, MD;  Location: [facility] ENDOSCOPY;  Service: Endoscopy;   Laterality: N/A;  . FOOT SURGERY Right   . HEMORRHOID BANDING    . left total hip arthroplasty  2012  . STOMACH SURGERY    . TONSILLECTOMY      There were no vitals filed for this visit.      Subjective Assessment - 10/22/17 1614    Subjective Patient pleasant and eager for therapy   Currently in Pain? Yes   Pain Location Back               ADULT SLP TREATMENT - 10/22/17 0001      General Information   Behavior/Cognition Alert;Cooperative;Pleasant mood;Requires cueing   HPI Patient is a 76 y/o woman with left temproal lobe hemorrhage on 09/01/17.     Treatment Provided   Treatment provided Cognitive-Linquistic     Pain Assessment   Pain Assessment No/denies pain     Cognitive-Linquistic Treatment   Treatment focused on Aphasia   Skilled Treatment VERBAL EXPRESSION: The patient is able to name pictured objects with 40% accuracy and read label to name pictured item with 90% accuracy.  Patient able to read aloud 3-letter words with 80% accuracy, min cues.  Generate intelligible/meaningful phrase to describe simple action picture with 20% accuracy and an additional 40% with intelligible but irrelevant or vague responses.  COMPREHENSION:  Follow 1-step motor commands with 73% accuracy.     Assessment / Recommendations / Plan   Plan Continue with  current plan of care     Progression Toward Goals   Progression toward goals Progressing toward goals          SLP Education - 10/22/17 1614    Education provided Yes   Education Details listen   Person(s) Educated Patient   Methods Explanation   Comprehension Verbalized understanding            SLP Long Term Goals - 09/15/17 1329      SLP LONG TERM GOAL #1   Title Patient will complete 2 unit processing tasks with 80% accuracy without the need of repetition of task instructions or significant delays in responding.   Time 8   Period Weeks   Status New   Target Date 11/15/17     SLP LONG TERM GOAL #2   Title  Patient will complete 3 unit processing tasks with 80% accuracy without the need of repetition of task instructions or significant delays in responding.   Time 12   Period Weeks   Status New   Target Date 12/15/17     SLP LONG TERM GOAL #3   Title Patient will name common objects with 50% accuracy.   Time 8   Period Weeks   Status New   Target Date 11/15/17     SLP LONG TERM GOAL #4   Title Patient will name common objects with 80% accuracy.   Time 12   Period Weeks   Status New   Target Date 12/15/17     SLP LONG TERM GOAL #5   Title Patient will generate meaningful phrase to complete simple/concrete linguistic task with 80% accuracy.   Time 12   Period Weeks   Status New   Target Date 12/15/17          Plan - 10/22/17 1614    Clinical Impression Statement The patient is generating more intelligible and appropriate speech as her auditory comprehension is improving.   Speech Therapy Frequency 2x / week   Duration Other (comment)   Treatment/Interventions Language facilitation;Multimodal communcation approach;SLP instruction and feedback;Patient/family education   Potential to Achieve Goals Good   Potential Considerations Ability to learn/carryover information;Co-morbidities;Cooperation/participation level;Medical prognosis;Pain level;Previous level of function;Severity of impairments;Family/community support   SLP Home Exercise Plan read aloud 3- and 4-letter words   Consulted and Agree with Plan of Care Patient      Patient will benefit from skilled therapeutic intervention in order to improve the following deficits and impairments:   Aphasia    Problem List Patient Active Problem List   Diagnosis Date Noted  . Gait disturbance, post-stroke 09/04/2017  . Wernicke's aphasia 09/04/2017  . 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 However, needs to rule out CAA, tumor or  CVT (vein of labbe)  09/01/2017  . Constipation 01/06/2017  . DJD (degenerative joint disease) of knee 07/18/2016  . GIB (gastrointestinal bleeding) 05/25/2016  . GI bleed 05/24/2016  . Degenerative joint disease (DJD) of hip 05/08/2016  . 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 Hypertensive heart disease   . Hemorrhage of gastrointestinal tract 11/13/2015  . Lumbosacral facet joint syndrome 07/26/2015  . Angioedema 07/13/2015  . DM2 (diabetes mellitus, type 2) (HCC) 07/13/2015  . Status post lumbar laminectomy 06/19/2015  . Lumbar radiculopathy 06/19/2015  . DDD (degenerative disc disease), lumbar 05/11/2015  . Facet syndrome, lumbar 05/11/2015  . Lumbar post-laminectomy syndrome 05/11/2015  . Sacroiliac joint disease 05/11/2015  .  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 Neuropathy due to secondary diabetes (HCC) 05/11/2015  . Essential hypertension 01/03/2015  . Coronary atherosclerosis of native coronary artery   . PAF (paroxysmal atrial fibrillation) (HCC) 10/20/2014  . Ischemic colitis (HCC) 10/20/2014  . Diabetes mellitus type 2 with complications (HCC) 10/20/2014  . COPD (chronic obstructive pulmonary disease) (HCC) 10/20/2014  . Hyperlipidemia 10/20/2014  . GERD (gastroesophageal reflux disease) 10/20/2014  . Ingrowing nail, right great toe 04/27/2014  . Internal hemorrhoid, bleeding 07/14/2013   Dollene PrimroseSusan G Kailei Cowens, MS/CCC- SLP  Leandrew KoyanagiAbernathy, Susie 10/22/2017, 4:15 PM  [facility] MAIN [facility] SERVICES [address] [place], [place], [address] Phone: [phone]   Fax:  [phone]   Name: Tomma RakersMary L Frink MRN: [id number] Date of Birth: June 08, 1941

## 2017-10-24 ENCOUNTER — Ambulatory Visit: Payer: Medicare Other

## 2017-10-24 ENCOUNTER — Encounter: Payer: Medicare Other | Admitting: Occupational Therapy

## 2017-10-24 ENCOUNTER — Encounter: Payer: Medicare Other | Admitting: Speech Pathology

## 2017-10-27 ENCOUNTER — Ambulatory Visit: Payer: Medicare Other | Admitting: Physical Therapy

## 2017-10-27 ENCOUNTER — Ambulatory Visit: Payer: Medicare Other | Admitting: Occupational Therapy

## 2017-10-29 ENCOUNTER — Ambulatory Visit: Payer: Medicare Other

## 2017-10-29 ENCOUNTER — Ambulatory Visit: Payer: Medicare Other | Admitting: Occupational Therapy

## 2017-10-31 ENCOUNTER — Ambulatory Visit: Payer: Medicare Other

## 2017-10-31 ENCOUNTER — Encounter: Payer: Medicare Other | Admitting: Occupational Therapy

## 2017-10-31 ENCOUNTER — Encounter: Payer: Medicare Other | Admitting: Speech Pathology

## 2017-11-04 ENCOUNTER — Ambulatory Visit: Payer: Medicare Other | Admitting: Occupational Therapy

## 2017-11-04 ENCOUNTER — Ambulatory Visit: Payer: Medicare Other | Attending: Physical Medicine & Rehabilitation | Admitting: Physical Therapy

## 2017-11-04 ENCOUNTER — Encounter: Payer: Self-pay | Admitting: Occupational Therapy

## 2017-11-04 ENCOUNTER — Encounter: Payer: Self-pay | Admitting: Physical Therapy

## 2017-11-04 DIAGNOSIS — M6281 Muscle weakness (generalized): Secondary | ICD-10-CM

## 2017-11-04 DIAGNOSIS — I611 Nontraumatic intracerebral hemorrhage in hemisphere, cortical: Secondary | ICD-10-CM

## 2017-11-04 DIAGNOSIS — R4701 Aphasia: Secondary | ICD-10-CM | POA: Diagnosis present

## 2017-11-04 DIAGNOSIS — I69315 Cognitive social or emotional deficit following cerebral infarction: Secondary | ICD-10-CM

## 2017-11-04 DIAGNOSIS — R2681 Unsteadiness on feet: Secondary | ICD-10-CM

## 2017-11-04 DIAGNOSIS — R278 Other lack of coordination: Secondary | ICD-10-CM | POA: Diagnosis present

## 2017-11-04 NOTE — Therapy (Signed)
[facility] MAIN [facility] SERVICES [address] [place], [place], [address] Phone: [phone]   Fax:  [phone]  Occupational Therapy Treatment  Patient Details  Name: Yvette Collier MRN: [id number] Date of Birth: September 26, 1941 Referring Provider: Dr. Wynn Banker   Encounter Date: 11/04/2017  OT End of Session - 11/04/17 1027    Visit Number  10    Number of Visits  24    Date for OT Re-Evaluation  12/08/17    OT Start Time  1017    OT Stop Time  1107    OT Time Calculation (min)  50 min    Activity Tolerance  Patient tolerated treatment well    Behavior During Therapy  [facility] for tasks assessed/performed       Past Medical History:  Diagnosis Date  . Asthma   . Chronic lower back pain   . COPD (chronic obstructive pulmonary disease) (HCC)   . Diabetes mellitus without complication (HCC)   . Gastrointestinal bleed   . GERD (gastroesophageal reflux disease)   . History of colon polyps   . Hypertension   . Hypertensive heart disease   . IBS (irritable bowel syndrome)   . Ischemic colitis (HCC)   . Non-obstructive Coronary Artery Disease    a. 05/2009 Cath [facility]): minimal nonobs dzs.  . Osteoarthritis   . PAF (paroxysmal atrial fibrillation) (HCC)    a. 09/2014 during GI illness;  b. CHA2DS2VASc = 5 (not currently on OAC 2/2 h/o GIB/ischemic colitis); c. 09/2014 Echo: EF 60-65%, imparied relaxation, nl RV size/fxn.  . Transient cerebral ischemia due to atrial fibrillation [facility])     Past Surgical History:  Procedure Laterality Date  . ABDOMINAL HYSTERECTOMY    . APPENDECTOMY    . BACK SURGERY     x 3, last 2004.  . CHOLECYSTECTOMY    . FOOT SURGERY Right   . HEMORRHOID BANDING    . left total hip arthroplasty  2012  . STOMACH SURGERY    . TONSILLECTOMY      There were no vitals filed for this visit.  Subjective Assessment - 11/04/17 1021    Subjective   Pt. reports she has had alot of pain over this past week .    Currently in  Pain?  No/denies    Pain Score  6     Pain Location  Back    Pain Descriptors / Indicators  Aching;Burning;Spasm;Cramping    Pain Type  Chronic pain      OT TREATMENT    Therapeutic Exercise:  Pt. performed 1.5# dowel ex. For UE strengthening secondary to weakness. Bilateral shoulder flexion, chest press, circular patterns, and elbow flexion/extension were performed. 2# dumbbell ex. for elbow flexion and extension, forearm supination/pronation, 1# wrist flexion/extension, and radial deviation. Pt. requires rest breaks and verbal cues for proper technique.  Selfcare:  Pt. Goals were reviewed with pt., and modified as needed.                        OT Education - 11/04/17 1025    Education provided  Yes    Education Details  UE functioning, ADLs, and safety.    Person(s) Educated  Patient    Methods  Demonstration;Explanation    Comprehension  Verbalized understanding       OT Short Term Goals - 09/15/17 1153      OT SHORT TERM GOAL #1   Title  --    Baseline  --  Time  --    Period  --    Status  --    Target Date  --      OT SHORT TERM GOAL #2   Title  --    Baseline  --    Time  --    Period  --    Status  New    Target Date  --      OT SHORT TERM GOAL #3   Title  --    Baseline  --    Time  --    Period  --    Status  --    Target Date  --      OT SHORT TERM GOAL #4   Title  --    Baseline  --    Time  --    Period  --    Status  --    Target Date  --      OT SHORT TERM GOAL #5   Title  --    Baseline  --    Time  --    Period  --    Status  --    Target Date  --      OT SHORT TERM GOAL #6   Title  --    Baseline  --    Time  --    Period  --    Status  --    Target Date  --      OT SHORT TERM GOAL #7   Title  --    Baseline  --    Time  --    Status  --    Target Date  --      OT SHORT TERM GOAL #8   Title  --    Baseline  --    Time  --    Period  --    Status  --    Target Date  --        OT  Long Term Goals - 11/04/17 1049      OT LONG TERM GOAL #1   Title  Pt. will be independent with basic ADL care needs.    Baseline  Eval: Pt. requires assist, 11/13: Pt. requires minA UE, ModA LE    Time  12    Period  Weeks    Status  On-going    Target Date  12/08/17      OT LONG TERM GOAL #2   Title  Pt. will be independent with light meal preparation.    Baseline  Eval: Pt. is unable, 11/04/2017: Pt. is able to assist with light cold meal prep, ahowver requires assist,.    Time  12    Period  Weeks    Status  On-going    Target Date  12/08/17      OT LONG TERM GOAL #3   Title  Pt. will be independent with tub/shower transfers    Baseline  Eval: Pt. requires assist, 11/04/2017: pt. conitnues to require assist using a shower seat.    Time  12    Period  Weeks    Status  On-going    Target Date  12/08/17      OT LONG TERM GOAL #4   Title  Pt. will increase BUE strength by 2 mm grades to assist with ADLs.    Baseline  Eval: BUE strength 4/5 overall    Time  12    Period  Weeks    Status  On-going    Target Date  12/08/17      OT LONG TERM GOAL #5   Title  Pt. will improve right [facility] skills to be able to assist with buttoning, and zipping.     Baseline  Eval: impaired, 12/08/2017: Pt. continues to require work on improving [facility] skills for buttnoning, and zipping clothing.    Time  12    Period  Weeks    Status  On-going    Target Date  12/08/17      OT LONG TERM GOAL #6   Title  Pt. will improve right grip strength to be able to open containers.    Baseline  Eval: Limited. 11/04/2017: improving    Time  12    Period  Weeks    Status  On-going    Target Date  12/08/17      OT LONG TERM GOAL #7   Title  Pt. will identify potential safety hazards accurately during ADLs, and IADLs with 100% accuracy.    Baseline  Eval: limited 11/04/2017: Conitnues to be impaired    Time  12    Period  Weeks    Status  On-going    Target Date  12/08/17      OT LONG TERM GOAL #8    Title  Pt. will demonstrate cognitive compensatory techniques 100% of the time with minimal cues.    Baseline  Eval: limite11/13/2018: conitnues to be limited.    Time  12    Period  Weeks    Status  On-going    Target Date  12/08/17            Plan - 11/04/17 1027    Clinical Impression Statement  Pt. continues to improve with UE strength, coordination, ADLs, and IADL tasks. Pt. continues to present with cognitive IADL impairments which limit safety awareness, and judgement. during ADL tasks. Pt. goals were reviewed with patient. Pt. continues to work towards treatment plan, and goals to improve ADL, and IADL functioning.    Occupational performance deficits (Please refer to evaluation for details):  ADL's;IADL's    Rehab Potential  Good    OT Frequency  2x / week    OT Treatment/Interventions  Self-care/ADL training;Therapeutic exercise;DME and/or AE instruction;Patient/family education;Therapeutic activities;Therapeutic exercises;Energy conservation    Consulted and Agree with Plan of Care  Patient       Patient will benefit from skilled therapeutic intervention in order to improve the following deficits and impairments:  Abnormal gait, Decreased activity tolerance, Decreased cognition, Decreased balance, Decreased strength, Impaired UE functional use  Visit Diagnosis: Muscle weakness (generalized)  G-Codes - 11/04/17 1033    Functional Assessment Tool Used (Outpatient only)  Clinical judgement based on pt. functional level, 9-hole peg test    Functional Limitation  Self care    Self Care Current Status ([id number])  At least 40 percent but less than 60 percent impaired, limited or restricted    Self Care Goal Status ([id number](G8988)  At least 1 percent but less than 20 percent impaired, limited or restricted       Problem List Patient Active Problem List   Diagnosis Date Noted  . Gait disturbance, post-stroke 09/04/2017  . Wernicke's aphasia 09/04/2017  . Left temporal lobe hemorrhage  (HCC) -  left temporal moderate ICH and left frontal trace SDH, concerning for traumatic ICH due to fall. However, needs to rule out CAA, tumor or CVT (vein of labbe)  09/01/2017  .  Constipation 01/06/2017  . DJD (degenerative joint disease) of knee 07/18/2016  . GIB (gastrointestinal bleeding) 05/25/2016  . GI bleed 05/24/2016  . Degenerative joint disease (DJD) of hip 05/08/2016  . 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 Hypertensive heart disease   . Hemorrhage of gastrointestinal tract 11/13/2015  . Lumbosacral facet joint syndrome 07/26/2015  . Angioedema 07/13/2015  . DM2 (diabetes mellitus, type 2) (HCC) 07/13/2015  . Status post lumbar laminectomy 06/19/2015  . Lumbar radiculopathy 06/19/2015  . DDD (degenerative disc disease), lumbar 05/11/2015  . Facet syndrome, lumbar 05/11/2015  . Lumbar post-laminectomy syndrome 05/11/2015  . Sacroiliac joint disease 05/11/2015  . 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 Neuropathy due to secondary diabetes (HCC) 05/11/2015  . Essential hypertension 01/03/2015  . Coronary atherosclerosis of native coronary artery   . PAF (paroxysmal atrial fibrillation) (HCC) 10/20/2014  . Ischemic colitis (HCC) 10/20/2014  . Diabetes mellitus type 2 with complications (HCC) 10/20/2014  . COPD (chronic obstructive pulmonary disease) (HCC) 10/20/2014  . Hyperlipidemia 10/20/2014  . GERD (gastroesophageal reflux disease) 10/20/2014  . Ingrowing nail, right great toe 04/27/2014  . Internal hemorrhoid, bleeding 07/14/2013    Olegario Messier, MS, OTR/L 11/04/2017, 11:15 AM  [facility] MAIN [facility] SERVICES [address] [place], [place], [address] Phone: [phone]   Fax:  [phone]  Name: Yvette Collier MRN: [id number] Date of Birth: 1941-04-12

## 2017-11-04 NOTE — Therapy (Signed)
[facility] [address] [place], [place], [address] Phone: [phone]   Fax:  [phone]  Physical Therapy Treatment  Patient Details  Name: Yvette Collier MRN: [id number] Date of Birth: 29-Oct-1941 Referring Provider: BURNS, HARRIETT P   Encounter Date: 11/04/2017  PT End of Session - 11/04/17 0938    Visit Number  8    Number of Visits  25    Date for PT Re-Evaluation  12/08/17    Authorization Type  g code 8/10    PT Start Time  0935    PT Stop Time  1015    PT Time Calculation (min)  40 min    Equipment Utilized During Treatment  Gait belt    Activity Tolerance  Patient tolerated treatment well;Other (comment) Easily distracted    Behavior During Therapy  [facility] tasks assessed/performed       Past Medical History:  Diagnosis Date  . Asthma   . Chronic lower back pain   . COPD (chronic obstructive pulmonary disease) (HCC)   . Diabetes mellitus without complication (HCC)   . Gastrointestinal bleed   . GERD (gastroesophageal reflux disease)   . History of colon polyps   . Hypertension   . Hypertensive heart disease   . IBS (irritable bowel syndrome)   . Ischemic colitis (HCC)   . Non-obstructive Coronary Artery Disease    a. 05/2009 Cath [facility]): minimal nonobs dzs.  . Osteoarthritis   . PAF (paroxysmal atrial fibrillation) (HCC)    a. 09/2014 during GI illness;  b. CHA2DS2VASc = 5 (not currently on OAC 2/2 h/o GIB/ischemic colitis); c. 09/2014 Echo: EF 60-65%, imparied relaxation, nl RV size/fxn.  . Transient cerebral ischemia due to atrial fibrillation [facility](HCC)     Past Surgical History:  Procedure Laterality Date  . ABDOMINAL HYSTERECTOMY    . APPENDECTOMY    . BACK SURGERY     x 3, last 2004.  . CHOLECYSTECTOMY    . FOOT SURGERY Right   . HEMORRHOID BANDING    . left total hip arthroplasty  2012  . STOMACH SURGERY    . TONSILLECTOMY      There were no vitals filed for this visit.  Subjective  Assessment - 11/04/17 0950    Subjective  Patient reports no pain today.     Pertinent History  Yvette Collier a 76 y.o.right handed femalewith history of hypertension,[name],diabetes mellitus, COPD. Patient lives alone independent prior to admission and works as a Education officer, environmentalpastor. One level home. Admitted 09/01/2017 with altered mental statusright side weaknessas well as aphasia. CT of the head showed a large acute left temporal lobe hematoma approximately 15.9 mL suspect hypertensive hemorrhage.Pt presents today with fluent aphasia.    Limitations  Walking    How long can you sit comfortably?  30 minutes before needing to shift positions    How long can you stand comfortably?  about 30 minutes before needing to sit and rest    How long can you walk comfortably?  states that she has no issues with walking    Patient Stated Goals  "get stronger"    Currently in Pain?  No/denies    Pain Score  0-No pain    Pain Onset  More than a month ago    Multiple Pain Sites  No       Treatment:  TM walking x 5 mins speed . 8 m/hour  Walking  marches in // bars 4x with decreased UE support   Neuro Re-ed  Stand on foam pad reachng  across midline without UE support, reaching outside of BOS x 3 minutes, CGA  airex pad: tapping stool x  20 x 2  no LOB no UE support  Side stepping on blue foam balance beam x 10 left and right   Step over and back over 1/2 foam no UE support 15x each leg, occasionally moving the   foam  when stepping over with LLE.   Pt requires frequent visual, verbal and tactile cues in order to complete tasks today. Performance improved when PT performed task with patient.                         PT Education - 11/04/17 [phone]    Education provided  Yes    Education Details  HEP    Person(s) Educated  Patient    Methods  Explanation;Demonstration    Comprehension  Verbalized understanding;Returned demonstration        PT Short Term Goals - 10/15/17 1036      PT SHORT TERM GOAL #1   Title  Pt will improve 5x sit to stand to 16 seconds in order to demonstrate improved LE strength and improved dynamic balance to decrease risk for falls.    Baseline  18 sec, 10/15/17: 21.41 s    Time  6    Period  Weeks    Status  On-going    Target Date  10/27/17      PT SHORT TERM GOAL #2   Title  Pt will improve gait speed to at least 0.8 m/s, demonstrating improved LE strength in order to safely ambulate with decreased risk for fals.    Baseline  0.688 m/s, 10/15/17: 1.0 m/s    Time  6    Period  Weeks    Status  Achieved    Target Date  10/27/17      PT SHORT TERM GOAL #3   Title  Pt will improve TUG to at least 16 seconds in order to demonstrate improve LE strength and dynamic balance.    Baseline  19 sec; 10/15/17: 11.23s    Time  6    Period  Weeks    Status  Achieved      PT SHORT TERM GOAL #4   Title  Pt will improve Berg Balance score to 46/56 in order to demonstarte improved dynamic and static balance in order to decrease overall falls risk.    Baseline  41/56; 51/56    Time  6    Period  Weeks    Status  Achieved        PT Long Term Goals - 10/15/17 1036      PT LONG TERM GOAL #1   Title  Pt will demonstrate independence with HEP in order to continue LE strengthening and balance interventions at home to manage symptoms and decrease overall falls risk.    Time  12    Period  Weeks    Status  On-going    Target Date  12/08/17      PT LONG TERM GOAL #2   Title  Pt will improve 5x sit to stand to <15 seconds in order to demonstrate improved LE strength and improved balance in order to decrease falls risk.    Baseline  18 sec; 10/15/17: 21.41s    Time  12    Period  Weeks    Status  On-going    Target Date  12/08/17      PT LONG TERM GOAL #3   Title  Pt will improve gait speed to at least 1.0 m/s in order to demonstrate improved LE strength and balance in order to ambulate with  decreased risk for falls.    Baseline  0.688 m/s; 10/15/17: 1.0 m/s    Time  12    Period  Weeks    Status  Achieved    Target Date  12/08/17      PT LONG TERM GOAL #4   Title  Pt will demonstrate an improved TUG score to < or equal to 14 seconds in order to demonstrate improved LE strength and balance in order to decrease overall falls risk.    Baseline  19 sec; 10/15/17: 11.23s    Time  12    Period  Weeks    Status  Achieved    Target Date  12/08/17      PT LONG TERM GOAL #5   Title  Pt will demonstrate an improved Berg Balance score to at least 51/56 in order to demonstrate improved dynamic and static balance activities.    Baseline  41/56; 10/15/17: 51/56    Time  12    Period  Weeks    Status  Achieved    Target Date  12/08/17      Additional Long Term Goals   Additional Long Term Goals  Yes      PT LONG TERM GOAL #6   Title  Pt will improve LE strength to 4+/5 in all limited planes in order to increase funtional abilities and improve gait.    Baseline  Gross strength assessment: 3+/5, L hip ext 2+/5, R hip ext 3-/5; 10/15/17: hip extension 3/5 bilaterally, hip abduction/adduction: 3+/5 on R side, 4-/5 on L side    Time  12    Period  Weeks    Status  On-going    Target Date  12/08/17      PT LONG TERM GOAL #7   Title  Pt will improve gait speed to at least 1.2 m/s in order to demonstrate improved LE strength and balance in order to ambulate with decreased risk for falls.    Baseline  0.688 m/s; 10/15/17: 1.0 m/s    Time  8    Period  Weeks    Status  New    Target Date  12/08/17      PT LONG TERM GOAL #8   Title  Pt will demonstrate an improved Berg Balance score to at least 54/56 in order to demonstrate improved dynamic and static balance activities.    Baseline  41/56; 10/15/17: 51/56    Time  8    Period  Weeks    Status  New    Target Date  12/08/17            Plan - 11/04/17 0939    Clinical Impression Statement  Continuous verbal cues and tactile  cues needed to correct form with exercises and for posture correction. Patient has moderate cognitive deficits and needs additional cues to perform exercises with correct technique. Patient is able to perform strengthening exercises without reports of increased pain. Patient does need CGA during therapeutic exercises for correct from and technique. .Focused on improving standing posture and increasing dynamic standing balance with single leg stand and reaching activities. . Patient will benefit from further skilled therapy to return to prior  level of function.    Rehab Potential  Good    PT Frequency  2x / week    PT Duration  12 weeks    PT Treatment/Interventions  Aquatic Therapy;Cryotherapy;Moist Heat;Gait training;Stair training;Functional mobility training;Therapeutic activities;Therapeutic exercise;Balance training;Neuromuscular re-education;Patient/family education;Manual techniques;Passive range of motion    PT Next Visit Plan  begin LE strength and dynamic balance activities    PT Home Exercise Plan  seated marches, seated hip ABD with red theraband    Consulted and Agree with Plan of Care  Patient       Patient will benefit from skilled therapeutic intervention in order to improve the following deficits and impairments:  Abnormal gait, Decreased coordination, Decreased activity tolerance, Decreased balance, Decreased cognition, Decreased mobility, Decreased safety awareness, Decreased strength, Difficulty walking  Visit Diagnosis: Aphasia  Muscle weakness (generalized)  Other lack of coordination  Unsteadiness on feet  Cognitive social or emotional deficit following cerebral infarction  Left temporal lobe hemorrhage (HCC) -  left temporal moderate ICH and left frontal trace SDH, concerning for traumatic ICH due to fall. However, needs to rule out CAA, tumor or CVT (vein of labbe)      Problem List Patient Active Problem List   Diagnosis Date Noted  . Gait disturbance,  post-stroke 09/04/2017  . Wernicke's aphasia 09/04/2017  . 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 However, needs to rule out CAA, tumor or CVT (vein of labbe)  09/01/2017  . Constipation 01/06/2017  . DJD (degenerative joint disease) of knee 07/18/2016  . GIB (gastrointestinal bleeding) 05/25/2016  . GI bleed 05/24/2016  . Degenerative joint disease (DJD) of hip 05/08/2016  . 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 Hypertensive heart disease   . Hemorrhage of gastrointestinal tract 11/13/2015  . Lumbosacral facet joint syndrome 07/26/2015  . Angioedema 07/13/2015  . DM2 (diabetes mellitus, type 2) (HCC) 07/13/2015  . Status post lumbar laminectomy 06/19/2015  . Lumbar radiculopathy 06/19/2015  . DDD (degenerative disc disease), lumbar 05/11/2015  . Facet syndrome, lumbar 05/11/2015  . Lumbar post-laminectomy syndrome 05/11/2015  . Sacroiliac joint disease 05/11/2015  . 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 Neuropathy due to secondary diabetes (HCC) 05/11/2015  . Essential hypertension 01/03/2015  . Coronary atherosclerosis of native coronary artery   . PAF (paroxysmal atrial fibrillation) (HCC) 10/20/2014  . Ischemic colitis (HCC) 10/20/2014  . Diabetes mellitus type 2 with complications (HCC) 10/20/2014  . COPD (chronic obstructive pulmonary disease) (HCC) 10/20/2014  . Hyperlipidemia 10/20/2014  . GERD (gastroesophageal reflux disease) 10/20/2014  . Ingrowing nail, right great toe 04/27/2014  . Internal hemorrhoid, bleeding 07/14/2013    Ezekiel Ina, Hyder DPT 11/04/2017, 9:51 AM  [facility] MAIN [facility] SERVICES [address] [place], [place], [address] Phone: [phone]   Fax:  [phone]  Name: Yvette Collier MRN: [id number] Date of Birth: May 13, 1941

## 2017-11-05 ENCOUNTER — Ambulatory Visit: Payer: Medicare Other | Admitting: Speech Pathology

## 2017-11-05 ENCOUNTER — Encounter: Payer: Self-pay | Admitting: Physical Therapy

## 2017-11-05 ENCOUNTER — Encounter: Payer: Self-pay | Admitting: Occupational Therapy

## 2017-11-05 ENCOUNTER — Ambulatory Visit: Payer: Medicare Other | Admitting: Occupational Therapy

## 2017-11-05 ENCOUNTER — Other Ambulatory Visit: Payer: Self-pay

## 2017-11-05 ENCOUNTER — Ambulatory Visit: Payer: Medicare Other | Admitting: Physical Therapy

## 2017-11-05 ENCOUNTER — Encounter: Payer: Self-pay | Admitting: Speech Pathology

## 2017-11-05 DIAGNOSIS — R4701 Aphasia: Secondary | ICD-10-CM | POA: Diagnosis not present

## 2017-11-05 DIAGNOSIS — M6281 Muscle weakness (generalized): Secondary | ICD-10-CM

## 2017-11-05 DIAGNOSIS — I611 Nontraumatic intracerebral hemorrhage in hemisphere, cortical: Secondary | ICD-10-CM

## 2017-11-05 DIAGNOSIS — R2681 Unsteadiness on feet: Secondary | ICD-10-CM

## 2017-11-05 DIAGNOSIS — I69315 Cognitive social or emotional deficit following cerebral infarction: Secondary | ICD-10-CM

## 2017-11-05 DIAGNOSIS — R278 Other lack of coordination: Secondary | ICD-10-CM

## 2017-11-05 NOTE — Therapy (Signed)
[facility] MAIN [facility] SERVICES [address] [place], [place], [address] Phone: [phone]   Fax:  [phone]  Occupational Therapy Treatment  Patient Details  Name: Yvette Collier MRN: [id number] Date of Birth: 06-06-1941 Referring Provider: Dr. Wynn Banker   Encounter Date: 11/05/2017  OT End of Session - 11/05/17 1601    Visit Number  11    Number of Visits  24    Date for OT Re-Evaluation  12/08/17    OT Start Time  0915    OT Stop Time  1000    OT Time Calculation (min)  45 min    Activity Tolerance  Patient tolerated treatment well    Behavior During Therapy  [facility] for tasks assessed/performed       Past Medical History:  Diagnosis Date  . Asthma   . Chronic lower back pain   . COPD (chronic obstructive pulmonary disease) (HCC)   . Diabetes mellitus without complication (HCC)   . Gastrointestinal bleed   . GERD (gastroesophageal reflux disease)   . History of colon polyps   . Hypertension   . Hypertensive heart disease   . IBS (irritable bowel syndrome)   . Ischemic colitis (HCC)   . Non-obstructive Coronary Artery Disease    a. 05/2009 Cath [facility]): minimal nonobs dzs.  . Osteoarthritis   . PAF (paroxysmal atrial fibrillation) (HCC)    a. 09/2014 during GI illness;  b. CHA2DS2VASc = 5 (not currently on OAC 2/2 h/o GIB/ischemic colitis); c. 09/2014 Echo: EF 60-65%, imparied relaxation, nl RV size/fxn.  . Transient cerebral ischemia due to atrial fibrillation [facility])     Past Surgical History:  Procedure Laterality Date  . ABDOMINAL HYSTERECTOMY    . APPENDECTOMY    . BACK SURGERY     x 3, last 2004.  . CHOLECYSTECTOMY    . FOOT SURGERY Right   . HEMORRHOID BANDING    . left total hip arthroplasty  2012  . STOMACH SURGERY    . TONSILLECTOMY      There were no vitals filed for this visit.  Subjective Assessment - 11/05/17 1559    Subjective   Patient reports she is doing well, looking forwards to Thanksgiving, spending  time with her kids and family.     Pertinent History  Pt. is a 76 y.o. female who suffered a Left temporla Lobe Hemorrhage secondary to Hypertensive Crisis, Moderate ICH, Left Frontal Trace SDH, and Traumatic ICH secondary to a fall. Pt. PMHX includes: HTN, DM with peripheral Neuropathy, Acute Kidney Injury, Hyperlipdidemia, and history of a GI Bleed. Pt. went the inpatient rehab at [facility], was discharged, and now is ready for outpatient rehab services.    Patient Stated Goals  Patient reports she would like to do as much as she can for herself.    Currently in Pain?  No/denies    Pain Score  0-No pain                   OT Treatments/Exercises (OP) - 11/05/17 1603      Fine Motor Coordination   Other Fine Motor Exercises  Patient seen for manipulation of grooved pegs to pick up and place into board with cues for turning items, use of right hand for task.       Neurological Re-education Exercises   Other Exercises 1  Patient seen for bilateral grip strengthening tasks with hand gripper 11# for sustained grip for 25 repetitions.  Patient performing UBE  for 4 minutes, 2 mins forwards and 2 minutes backwards with therapist in constant attendance to adjust settings and resistance and to ensure grip.  Patient performing resistive pinch skills with right hand to place onto elevated surface and remove with cues for technique.  Dificulty with most resistive black pinch pins.  Ball exercises for range of motion, bilateral hand use for shoulder flexion, chest press, 10 repetitions.               OT Education - 11/05/17 1600    Education provided  Yes    Education Details  strength and coordination exercises.    Person(s) Educated  Patient    Methods  Explanation;Demonstration;Verbal cues    Comprehension  Verbal cues required;Returned demonstration;Verbalized understanding          OT Long Term Goals - 11/04/17 1049      OT LONG TERM GOAL #1   Title  Pt. will be  independent with basic ADL care needs.    Baseline  Eval: Pt. requires assist, 11/13: Pt. requires minA UE, ModA LE    Time  12    Period  Weeks    Status  On-going    Target Date  12/08/17      OT LONG TERM GOAL #2   Title  Pt. will be independent with light meal preparation.    Baseline  Eval: Pt. is unable, 11/04/2017: Pt. is able to assist with light cold meal prep, ahowver requires assist,.    Time  12    Period  Weeks    Status  On-going    Target Date  12/08/17      OT LONG TERM GOAL #3   Title  Pt. will be independent with tub/shower transfers    Baseline  Eval: Pt. requires assist, 11/04/2017: pt. conitnues to require assist using a shower seat.    Time  12    Period  Weeks    Status  On-going    Target Date  12/08/17      OT LONG TERM GOAL #4   Title  Pt. will increase BUE strength by 2 mm grades to assist with ADLs.    Baseline  Eval: BUE strength 4/5 overall    Time  12    Period  Weeks    Status  On-going    Target Date  12/08/17      OT LONG TERM GOAL #5   Title  Pt. will improve right [facility] skills to be able to assist with buttoning, and zipping.     Baseline  Eval: impaired, 12/08/2017: Pt. continues to require work on improving [facility] skills for buttnoning, and zipping clothing.    Time  12    Period  Weeks    Status  On-going    Target Date  12/08/17      OT LONG TERM GOAL #6   Title  Pt. will improve right grip strength to be able to open containers.    Baseline  Eval: Limited. 11/04/2017: improving    Time  12    Period  Weeks    Status  On-going    Target Date  12/08/17      OT LONG TERM GOAL #7   Title  Pt. will identify potential safety hazards accurately during ADLs, and IADLs with 100% accuracy.    Baseline  Eval: limited 11/04/2017: Conitnues to be impaired    Time  12    Period  Weeks    Status  On-going    Target Date  12/08/17      OT LONG TERM GOAL #8   Title  Pt. will demonstrate cognitive compensatory techniques 100% of the time with  minimal cues.    Baseline  Eval: limite11/13/2018: conitnues to be limited.    Time  12    Period  Weeks    Status  On-going    Target Date  12/08/17            Plan - 11/05/17 1601    Clinical Impression Statement  Patient working towards improved coordination and strength to perform necessary daily tasks.  She was able to follow 1-2 step commands this date during therapy.  She requires cues for safety and reminders for navigation in the clinic when transitioning to other therapies.  Continue to work towards goals to improve independence and safety in daily tasks.     Occupational performance deficits (Please refer to evaluation for details):  ADL's;IADL's;Leisure    Rehab Potential  Good    OT Frequency  2x / week    OT Duration  12 weeks    OT Treatment/Interventions  Self-care/ADL training;Therapeutic exercise;DME and/or AE instruction;Patient/family education;Therapeutic activities;Therapeutic exercises;Energy conservation;Neuromuscular education    Consulted and Agree with Plan of Care  Patient       Patient will benefit from skilled therapeutic intervention in order to improve the following deficits and impairments:  Abnormal gait, Decreased activity tolerance, Decreased cognition, Decreased balance, Decreased strength, Impaired UE functional use, Decreased coordination  Visit Diagnosis: Muscle weakness (generalized)  Other lack of coordination  G-Codes - 11/04/17 1033    Functional Assessment Tool Used (Outpatient only)  Clinical judgement based on pt. functional level, 9-hole peg test    Functional Limitation  Self care    Self Care Current Status ([id number])  At least 40 percent but less than 60 percent impaired, limited or restricted    Self Care Goal Status ([id number](G8988)  At least 1 percent but less than 20 percent impaired, limited or restricted       Problem List Patient Active Problem List   Diagnosis Date Noted  . Gait disturbance, post-stroke 09/04/2017  .  Wernicke's aphasia 09/04/2017  . 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 However, needs to rule out CAA, tumor or CVT (vein of labbe)  09/01/2017  . Constipation 01/06/2017  . DJD (degenerative joint disease) of knee 07/18/2016  . GIB (gastrointestinal bleeding) 05/25/2016  . GI bleed 05/24/2016  . Degenerative joint disease (DJD) of hip 05/08/2016  . 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 Hypertensive heart disease   . Hemorrhage of gastrointestinal tract 11/13/2015  . Lumbosacral facet joint syndrome 07/26/2015  . Angioedema 07/13/2015  . DM2 (diabetes mellitus, type 2) (HCC) 07/13/2015  . Status post lumbar laminectomy 06/19/2015  . Lumbar radiculopathy 06/19/2015  . DDD (degenerative disc disease), lumbar 05/11/2015  . Facet syndrome, lumbar 05/11/2015  . Lumbar post-laminectomy syndrome 05/11/2015  . Sacroiliac joint disease 05/11/2015  . 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 Neuropathy due to secondary diabetes (HCC) 05/11/2015  . Essential hypertension 01/03/2015  . Coronary atherosclerosis of native coronary artery   . PAF (paroxysmal atrial fibrillation) (HCC) 10/20/2014  . Ischemic colitis (HCC) 10/20/2014  . Diabetes mellitus type 2 with complications (HCC) 10/20/2014  . COPD (chronic obstructive pulmonary disease) (HCC) 10/20/2014  . Hyperlipidemia 10/20/2014  . GERD (gastroesophageal reflux disease)  10/20/2014  . Ingrowing nail, right great toe 04/27/2014  . Internal hemorrhoid, bleeding 07/14/2013   Rakel Junio T Arne ClevelandLovett, OTR/L, CLT  Jaquita Bessire 11/05/2017, 4:11 PM  [place] [address] [place], [place], [address] Phone: [phone]   Fax:  [phone]  Name: Tomma RakersMary L Welp MRN: [id number] Date of Birth: Nov 17, 1941

## 2017-11-05 NOTE — Therapy (Signed)
[place] [facility] MAIN [facility] [address] [place], [place], [address] Phone: [phone]   Fax:  [phone]  Physical Therapy Treatment  Patient Details  Name: Yvette Collier MRN: [id number] Date of Birth: 10/07/1941 Referring Provider: BURNS, HARRIETT P   Encounter Date: 11/05/2017  PT End of Session - 11/05/17 1100    Visit Number  9    Number of Visits  25    Date for PT Re-Evaluation  12/08/17    Authorization Type  g code 9/10    PT Start Time  1058    PT Stop Time  1139    PT Time Calculation (min)  41 min    Equipment Utilized During Treatment  Gait belt    Activity Tolerance  Patient tolerated treatment well;Other (comment) Easily distracted    Behavior During Therapy  [facility] for tasks assessed/performed       Past Medical History:  Diagnosis Date  . Asthma   . Chronic lower back pain   . COPD (chronic obstructive pulmonary disease) (HCC)   . Diabetes mellitus without complication (HCC)   . Gastrointestinal bleed   . GERD (gastroesophageal reflux disease)   . History of colon polyps   . Hypertension   . Hypertensive heart disease   . IBS (irritable bowel syndrome)   . Ischemic colitis (HCC)   . Non-obstructive Coronary Artery Disease    a. 05/2009 Cath [facility]): minimal nonobs dzs.  . Osteoarthritis   . PAF (paroxysmal atrial fibrillation) (HCC)    a. 09/2014 during GI illness;  b. CHA2DS2VASc = 5 (not currently on OAC 2/2 h/o GIB/ischemic colitis); c. 09/2014 Echo: EF 60-65%, imparied relaxation, nl RV size/fxn.  . Transient cerebral ischemia due to atrial fibrillation [facility](HCC)     Past Surgical History:  Procedure Laterality Date  . ABDOMINAL HYSTERECTOMY    . APPENDECTOMY    . BACK SURGERY     x 3, last 2004.  . CHOLECYSTECTOMY    . FOOT SURGERY Right   . HEMORRHOID BANDING    . left total hip arthroplasty  2012  . STOMACH SURGERY    . TONSILLECTOMY      There were no vitals filed for this visit.  Subjective  Assessment - 11/05/17 1059    Subjective  Patient reports no pain today. She is having a busy day. No new concerns noted.    Pertinent History  Yvette Collier a 76 y.o.right handed femalewith history of hypertension,[name],diabetes mellitus, COPD. Patient lives alone independent prior to admission and works as a Education officer, environmentalpastor. One level home. Admitted 09/01/2017 with altered mental statusright side weaknessas well as aphasia. CT of the head showed a large acute left temporal lobe hematoma approximately 15.9 mL suspect hypertensive hemorrhage.Pt presents today with fluent aphasia.    Limitations  Walking    How long can you sit comfortably?  30 minutes before needing to shift positions    How long can you stand comfortably?  about 30 minutes before needing to sit and rest    How long can you walk comfortably?  states that she has no issues with walking    Patient Stated Goals  "get stronger"    Currently in Pain?  No/denies    Pain Score  0-No pain    Pain Onset  More than a month ago       Ther-ex   Sit to stand practice without UE support with 2.5#  overhead bar press when in standing 2 x 10; Seated marches 2 x 20 B LE with 3# ankle weights;  LAQ 2 x 20 B LE with 3# ankle weights;  Tandem walking on 2x4 in // bars x multiple laps with CGA; Bridges 2 x 20    Neuromuscular training:  Side stepping on blue balance foam x 5 left and right   tandem stepping on blue balance foam x 5 left and right Standing on blue foam and tapping to 6 inch stool x 20 Standing on blue foam and raising 2 lb rod x 10 x 2  Pt requires frequent visual, verbal and tactile cues in order to complete tasks today. Difficulty following commands, easily forgets what exercise she was doing. Increased time required to perform outcome measures due to need for demonstration and heavy cueing.                        PT Education - 11/05/17 1100    Education  provided  Yes    Education Details  safety with mobility    Person(s) Educated  Patient    Methods  Explanation;Other (comment)    Comprehension  Verbalized understanding       PT Short Term Goals - 10/15/17 1036      PT SHORT TERM GOAL #1   Title  Pt will improve 5x sit to stand to 16 seconds in order to demonstrate improved LE strength and improved dynamic balance to decrease risk for falls.    Baseline  18 sec, 10/15/17: 21.41 s    Time  6    Period  Weeks    Status  On-going    Target Date  10/27/17      PT SHORT TERM GOAL #2   Title  Pt will improve gait speed to at least 0.8 m/s, demonstrating improved LE strength in order to safely ambulate with decreased risk for fals.    Baseline  0.688 m/s, 10/15/17: 1.0 m/s    Time  6    Period  Weeks    Status  Achieved    Target Date  10/27/17      PT SHORT TERM GOAL #3   Title  Pt will improve TUG to at least 16 seconds in order to demonstrate improve LE strength and dynamic balance.    Baseline  19 sec; 10/15/17: 11.23s    Time  6    Period  Weeks    Status  Achieved      PT SHORT TERM GOAL #4   Title  Pt will improve Berg Balance score to 46/56 in order to demonstarte improved dynamic and static balance in order to decrease overall falls risk.    Baseline  41/56; 51/56    Time  6    Period  Weeks    Status  Achieved        PT Long Term Goals - 10/15/17 1036      PT LONG TERM GOAL #1   Title  Pt will demonstrate independence with HEP in order to continue LE strengthening and balance interventions at home to manage symptoms and decrease overall falls risk.    Time  12    Period  Weeks    Status  On-going    Target Date  12/08/17      PT LONG TERM GOAL #2   Title  Pt will improve 5x sit to stand to <15 seconds in order to demonstrate improved  LE strength and improved balance in order to decrease falls risk.    Baseline  18 sec; 10/15/17: 21.41s    Time  12    Period  Weeks    Status  On-going    Target Date   12/08/17      PT LONG TERM GOAL #3   Title  Pt will improve gait speed to at least 1.0 m/s in order to demonstrate improved LE strength and balance in order to ambulate with decreased risk for falls.    Baseline  0.688 m/s; 10/15/17: 1.0 m/s    Time  12    Period  Weeks    Status  Achieved    Target Date  12/08/17      PT LONG TERM GOAL #4   Title  Pt will demonstrate an improved TUG score to < or equal to 14 seconds in order to demonstrate improved LE strength and balance in order to decrease overall falls risk.    Baseline  19 sec; 10/15/17: 11.23s    Time  12    Period  Weeks    Status  Achieved    Target Date  12/08/17      PT LONG TERM GOAL #5   Title  Pt will demonstrate an improved Berg Balance score to at least 51/56 in order to demonstrate improved dynamic and static balance activities.    Baseline  41/56; 10/15/17: 51/56    Time  12    Period  Weeks    Status  Achieved    Target Date  12/08/17      Additional Long Term Goals   Additional Long Term Goals  Yes      PT LONG TERM GOAL #6   Title  Pt will improve LE strength to 4+/5 in all limited planes in order to increase funtional abilities and improve gait.    Baseline  Gross strength assessment: 3+/5, L hip ext 2+/5, R hip ext 3-/5; 10/15/17: hip extension 3/5 bilaterally, hip abduction/adduction: 3+/5 on R side, 4-/5 on L side    Time  12    Period  Weeks    Status  On-going    Target Date  12/08/17      PT LONG TERM GOAL #7   Title  Pt will improve gait speed to at least 1.2 m/s in order to demonstrate improved LE strength and balance in order to ambulate with decreased risk for falls.    Baseline  0.688 m/s; 10/15/17: 1.0 m/s    Time  8    Period  Weeks    Status  New    Target Date  12/08/17      PT LONG TERM GOAL #8   Title  Pt will demonstrate an improved Berg Balance score to at least 54/56 in order to demonstrate improved dynamic and static balance activities.    Baseline  41/56; 10/15/17: 51/56     Time  8    Period  Weeks    Status  New    Target Date  12/08/17            Plan - 11/05/17 1101    Clinical Impression Statement  Patient required min verbal cues to perform 3 way hip with red theraband correctly and required verbal and tactile cues during all dynamic standing balance activities. Patient demonstrated decreased gait speed with ambulation without AD.  Patient will continue to benefit from skilled therapy in order to improve strength, dynamic standing balance and  increase gait speed to reduce risk for falls    Rehab Potential  Good    PT Frequency  2x / week    PT Duration  12 weeks    PT Treatment/Interventions  Aquatic Therapy;Cryotherapy;Moist Heat;Gait training;Stair training;Functional mobility training;Therapeutic activities;Therapeutic exercise;Balance training;Neuromuscular re-education;Patient/family education;Manual techniques;Passive range of motion    PT Next Visit Plan  begin LE strength and dynamic balance activities    PT Home Exercise Plan  seated marches, seated hip ABD with red theraband    Consulted and Agree with Plan of Care  Patient       Patient will benefit from skilled therapeutic intervention in order to improve the following deficits and impairments:  Abnormal gait, Decreased coordination, Decreased activity tolerance, Decreased balance, Decreased cognition, Decreased mobility, Decreased safety awareness, Decreased strength, Difficulty walking  Visit Diagnosis: Muscle weakness (generalized)  Aphasia  Other lack of coordination  Unsteadiness on feet  Cognitive social or emotional deficit following cerebral infarction  Left temporal lobe hemorrhage (HCC) -  left temporal moderate ICH and left frontal trace SDH, concerning for traumatic ICH due to fall. However, needs to rule out CAA, tumor or CVT (vein of labbe)      Problem List Patient Active Problem List   Diagnosis Date Noted  . Gait disturbance, post-stroke 09/04/2017  .  Wernicke's aphasia 09/04/2017  . 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 However, needs to rule out CAA, tumor or CVT (vein of labbe)  09/01/2017  . Constipation 01/06/2017  . DJD (degenerative joint disease) of knee 07/18/2016  . GIB (gastrointestinal bleeding) 05/25/2016  . GI bleed 05/24/2016  . Degenerative joint disease (DJD) of hip 05/08/2016  . 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 Hypertensive heart disease   . Hemorrhage of gastrointestinal tract 11/13/2015  . Lumbosacral facet joint syndrome 07/26/2015  . Angioedema 07/13/2015  . DM2 (diabetes mellitus, type 2) (HCC) 07/13/2015  . Status post lumbar laminectomy 06/19/2015  . Lumbar radiculopathy 06/19/2015  . DDD (degenerative disc disease), lumbar 05/11/2015  . Facet syndrome, lumbar 05/11/2015  . Lumbar post-laminectomy syndrome 05/11/2015  . Sacroiliac joint disease 05/11/2015  . 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 Neuropathy due to secondary diabetes (HCC) 05/11/2015  . Essential hypertension 01/03/2015  . Coronary atherosclerosis of native coronary artery   . PAF (paroxysmal atrial fibrillation) (HCC) 10/20/2014  . Ischemic colitis (HCC) 10/20/2014  . Diabetes mellitus type 2 with complications (HCC) 10/20/2014  . COPD (chronic obstructive pulmonary disease) (HCC) 10/20/2014  . Hyperlipidemia 10/20/2014  . GERD (gastroesophageal reflux disease) 10/20/2014  . Ingrowing nail, right great toe 04/27/2014  . Internal hemorrhoid, bleeding 07/14/2013    Ezekiel InaMansfield, Kristine S, [place]PT DPT 11/05/2017, 11:04 AM  [place] [facility] MAIN [facility] [address] [place], [place], [address] Phone: [phone]   Fax:  [phone]  Name: Yvette Collier MRN: [id number] Date of Birth: [birth year]

## 2017-11-05 NOTE — Therapy (Signed)
[place] [facility] MAIN [facility] SERVICES [address] [place], [place], [address] Phone: [phone]   Fax:  [phone]  Speech Language Pathology Treatment  Patient Details  Name: Yvette Collier MRN: [id number] Date of Birth: 24-Aug-1941 Referring Provider: Dr. Wynn Banker   Encounter Date: 11/05/2017  End of Session - 11/05/17 1159    Visit Number  8    Number of Visits  25    Date for SLP Re-Evaluation  12/15/17    SLP Start Time  1000    SLP Stop Time   1055    SLP Time Calculation (min)  55 min    Activity Tolerance  Patient tolerated treatment well       Past Medical History:  Diagnosis Date  . Asthma   . Chronic lower back pain   . COPD (chronic obstructive pulmonary disease) (HCC)   . Diabetes mellitus without complication (HCC)   . Gastrointestinal bleed   . GERD (gastroesophageal reflux disease)   . History of colon polyps   . Hypertension   . Hypertensive heart disease   . IBS (irritable bowel syndrome)   . Ischemic colitis (HCC)   . Non-obstructive Coronary Artery Disease    a. 05/2009 [facility]): minimal nonobs dzs.  . Osteoarthritis   . PAF (paroxysmal atrial fibrillation) (HCC)    a. 09/2014 during GI illness;  b. CHA2DS2VASc = 5 (not currently on OAC 2/2 h/o GIB/ischemic colitis); c. 09/2014 Echo: EF 60-65%, imparied relaxation, nl RV size/fxn.  . Transient cerebral ischemia due to atrial fibrillation [facility])     Past Surgical History:  Procedure Laterality Date  . ABDOMINAL HYSTERECTOMY    . APPENDECTOMY    . BACK SURGERY     x 3, last 2004.  . CHOLECYSTECTOMY    . FOOT SURGERY Right   . HEMORRHOID BANDING    . left total hip arthroplasty  2012  . STOMACH SURGERY    . TONSILLECTOMY      There were no vitals filed for this visit.  Subjective Assessment - 11/05/17 1159    Subjective  Patient pleasant and eager for therapy    Currently in Pain?  No/denies            ADULT SLP TREATMENT - 11/05/17 0001      General Information   Behavior/Cognition  Alert;Cooperative;Pleasant mood;Requires cueing    HPI  Patient is a 76 y/o woman with left temproal lobe hemorrhage on 09/01/17.      Treatment Provided   Treatment provided  Cognitive-Linquistic      Pain Assessment   Pain Assessment  No/denies pain      Cognitive-Linquistic Treatment   Treatment focused on  Aphasia    Skilled Treatment  VERBAL EXPRESSION: Patient able to read aloud 3-letter words with 88% accuracy and 5-letter words with 50% accuracy.  Read 3-4 word phrases after reading each word separtely.  COMPREHENSION:  Follow 1-step motor commands with 100% accuracy.      Assessment / Recommendations / Plan   Plan  Continue with current plan of care      Progression Toward Goals   Progression toward goals  Progressing toward goals       SLP Education - 11/05/17 1159    Education provided  Yes    Education Details  "slow down" when reading    Person(s) Educated  Patient    Methods  Explanation    Comprehension  Verbalized understanding  SLP Long Term Goals - 09/15/17 1329      SLP LONG TERM GOAL #1   Title  Patient will complete 2 unit processing tasks with 80% accuracy without the need of repetition of task instructions or significant delays in responding.    Time  8    Period  Weeks    Status  New    Target Date  11/15/17      SLP LONG TERM GOAL #2   Title  Patient will complete 3 unit processing tasks with 80% accuracy without the need of repetition of task instructions or significant delays in responding.    Time  12    Period  Weeks    Status  New    Target Date  12/15/17      SLP LONG TERM GOAL #3   Title  Patient will name common objects with 50% accuracy.    Time  8    Period  Weeks    Status  New    Target Date  11/15/17      SLP LONG TERM GOAL #4   Title  Patient will name common objects with 80% accuracy.    Time  12    Period  Weeks    Status  New    Target Date  12/15/17      SLP LONG  TERM GOAL #5   Title  Patient will generate meaningful phrase to complete simple/concrete linguistic task with 80% accuracy.    Time  12    Period  Weeks    Status  New    Target Date  12/15/17       Plan - 11/05/17 1200    Clinical Impression Statement  The patient is generating more intelligible and appropriate speech as her auditory comprehension is improving.    Speech Therapy Frequency  2x / week    Duration  Other (comment)    Treatment/Interventions  Language facilitation;Multimodal communcation approach;SLP instruction and feedback;Patient/family education    Potential to Achieve Goals  Good    Potential Considerations  Ability to learn/carryover information;Co-morbidities;Cooperation/participation level;Medical prognosis;Pain level;Previous level of function;Severity of impairments;Family/community support    SLP Home Exercise Plan  read aloud 3- and 4-letter words    Consulted and Agree with Plan of Care  Patient       Patient will benefit from skilled therapeutic intervention in order to improve the following deficits and impairments:   Aphasia    Problem List Patient Active Problem List   Diagnosis Date Noted  . Gait disturbance, post-stroke 09/04/2017  . Wernicke's aphasia 09/04/2017  . 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 However, needs to rule out CAA, tumor or CVT (vein of labbe)  09/01/2017  . Constipation 01/06/2017  . DJD (degenerative joint disease) of knee 07/18/2016  . GIB (gastrointestinal bleeding) 05/25/2016  . GI bleed 05/24/2016  . Degenerative joint disease (DJD) of hip 05/08/2016  . 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 Hypertensive heart disease   . Hemorrhage of gastrointestinal tract 11/13/2015  . Lumbosacral facet joint syndrome 07/26/2015  . Angioedema 07/13/2015  . DM2 (diabetes mellitus, type 2) (HCC) 07/13/2015  . Status post lumbar  laminectomy 06/19/2015  . Lumbar radiculopathy 06/19/2015  . DDD (degenerative disc disease), lumbar 05/11/2015  . Facet syndrome, lumbar 05/11/2015  . Lumbar post-laminectomy syndrome 05/11/2015  . Sacroiliac joint disease 05/11/2015  . Spinal stenosis, lumbar region, with  neurogenic claudication 05/11/2015  . Neuropathy due to secondary diabetes (HCC) 05/11/2015  . Essential hypertension 01/03/2015  . Coronary atherosclerosis of native coronary artery   . PAF (paroxysmal atrial fibrillation) (HCC) 10/20/2014  . Ischemic colitis (HCC) 10/20/2014  . Diabetes mellitus type 2 with complications (HCC) 10/20/2014  . COPD (chronic obstructive pulmonary disease) (HCC) 10/20/2014  . Hyperlipidemia 10/20/2014  . GERD (gastroesophageal reflux disease) 10/20/2014  . Ingrowing nail, right great toe 04/27/2014  . Internal hemorrhoid, bleeding 07/14/2013   Dollene PrimroseSusan G Jonai Weyland, MS/CCC- SLP  Leandrew KoyanagiAbernathy, Susie 11/05/2017, 12:01 PM  [place] [facility] [address] [place], [place], [address] Phone: [phone]   Fax:  [phone]   Name: Yvette RakersMary L Collier MRN: [id number] Date of Birth: 1941-12-10

## 2017-11-07 ENCOUNTER — Ambulatory Visit: Payer: Medicare Other

## 2017-11-07 ENCOUNTER — Encounter: Payer: Medicare Other | Admitting: Occupational Therapy

## 2017-11-07 ENCOUNTER — Encounter: Payer: Medicare Other | Admitting: Speech Pathology

## 2017-11-10 ENCOUNTER — Ambulatory Visit: Payer: Medicare Other

## 2017-11-10 ENCOUNTER — Ambulatory Visit: Payer: Medicare Other | Admitting: Occupational Therapy

## 2017-11-10 ENCOUNTER — Ambulatory Visit: Payer: Medicare Other | Admitting: Speech Pathology

## 2017-11-12 ENCOUNTER — Ambulatory Visit: Payer: Medicare Other

## 2017-11-12 ENCOUNTER — Ambulatory Visit: Payer: Medicare Other | Admitting: Physical Therapy

## 2017-11-12 ENCOUNTER — Encounter: Payer: Self-pay | Admitting: Occupational Therapy

## 2017-11-12 ENCOUNTER — Encounter: Payer: Self-pay | Admitting: Physical Therapy

## 2017-11-12 ENCOUNTER — Ambulatory Visit: Payer: Medicare Other | Admitting: Occupational Therapy

## 2017-11-12 DIAGNOSIS — R278 Other lack of coordination: Secondary | ICD-10-CM

## 2017-11-12 DIAGNOSIS — M6281 Muscle weakness (generalized): Secondary | ICD-10-CM

## 2017-11-12 DIAGNOSIS — R4701 Aphasia: Secondary | ICD-10-CM

## 2017-11-12 DIAGNOSIS — I69315 Cognitive social or emotional deficit following cerebral infarction: Secondary | ICD-10-CM

## 2017-11-12 DIAGNOSIS — I611 Nontraumatic intracerebral hemorrhage in hemisphere, cortical: Secondary | ICD-10-CM

## 2017-11-12 DIAGNOSIS — R2681 Unsteadiness on feet: Secondary | ICD-10-CM

## 2017-11-12 NOTE — Therapy (Signed)
[facility] MAIN [facility] SERVICES [address] [place], [place], [address] Phone: [phone]   Fax:  [phone]  Physical Therapy Treatment  Patient Details  Name: Yvette Collier MRN: [id number] Date of Birth: 08-Sep-1941 Referring Provider: BURNS, HARRIETT P   Encounter Date: 11/12/2017  PT End of Session - 11/12/17 1013    Visit Number  10    Number of Visits  25    Date for PT Re-Evaluation  12/08/17    Authorization Type  g code 10/10    PT Start Time  1000    PT Stop Time  1045    PT Time Calculation (min)  45 min    Equipment Utilized During Treatment  Gait belt    Activity Tolerance  Patient tolerated treatment well;Other (comment) Easily distracted    Behavior During Therapy  [facility] for tasks assessed/performed       Past Medical History:  Diagnosis Date  . Asthma   . Chronic lower back pain   . COPD (chronic obstructive pulmonary disease) (HCC)   . Diabetes mellitus without complication (HCC)   . Gastrointestinal bleed   . GERD (gastroesophageal reflux disease)   . History of colon polyps   . Hypertension   . Hypertensive heart disease   . IBS (irritable bowel syndrome)   . Ischemic colitis (HCC)   . Non-obstructive Coronary Artery Disease    a. 05/2009 Cath [facility]): minimal nonobs dzs.  . Osteoarthritis   . PAF (paroxysmal atrial fibrillation) (HCC)    a. 09/2014 during GI illness;  b. CHA2DS2VASc = 5 (not currently on OAC 2/2 h/o GIB/ischemic colitis); c. 09/2014 Echo: EF 60-65%, imparied relaxation, nl RV size/fxn.  . Transient cerebral ischemia due to atrial fibrillation [facility])     Past Surgical History:  Procedure Laterality Date  . ABDOMINAL HYSTERECTOMY    . APPENDECTOMY    . BACK SURGERY     x 3, last 2004.  . CHOLECYSTECTOMY    . COLONOSCOPY WITH PROPOFOL N/A 03/20/2017   Procedure: COLONOSCOPY WITH PROPOFOL;  Surgeon: Wyline Mood, MD;  Location: [facility] ENDOSCOPY;  Service: Endoscopy;  Laterality: N/A;  .  ESOPHAGOGASTRODUODENOSCOPY (EGD) WITH PROPOFOL N/A 03/20/2017   Procedure: ESOPHAGOGASTRODUODENOSCOPY (EGD) WITH PROPOFOL;  Surgeon: Wyline Mood, MD;  Location: [facility] ENDOSCOPY;  Service: Endoscopy;  Laterality: N/A;  . FOOT SURGERY Right   . HEMORRHOID BANDING    . left total hip arthroplasty  2012  . STOMACH SURGERY    . TONSILLECTOMY      There were no vitals filed for this visit.  Subjective Assessment - 11/12/17 1004    Subjective  Patient is cofused today and talking about her hair and her grown up girls. She is not able to respond to questions and stay on the same subject.     Pertinent History  Alexxus Sobh Whiteis a 76 y.o.right handed femalewith history of hypertension,[name],diabetes mellitus, COPD. Patient lives alone independent prior to admission and works as a Education officer, environmental. One level home. Admitted 09/01/2017 with altered mental statusright side weaknessas well as aphasia. CT of the head showed a large acute left temporal lobe hematoma approximately 15.9 mL suspect hypertensive hemorrhage.Pt presents today with fluent aphasia.    Limitations  Walking    How long can you sit comfortably?  30 minutes before needing to shift positions    How long can you stand comfortably?  about 30 minutes before needing to sit and rest  How long can you walk comfortably?  states that she has no issues with walking    Patient Stated Goals  "get stronger"    Currently in Pain?  No/denies    Pain Score  0-No pain    Multiple Pain Sites  No              Treatment:  TM walking x 5 mins speed . 8 m/hour  Walking marches in // bars 4x with decreased UE support   Neuro Re-ed  Stand on foam padreachng  across midlinewithout UE support, reaching outside of BOS x 3 minutes, CGA  airex ZOX:WRUEAVW stool x  20 x 2  no LOB no UE support  Side stepping on blue foam balance beam x 10 left and right   Step over and back over1/2 foamno UE  support 15x each leg, occasionally moving the   foamwhen stepping over with LLE.   Pt requires frequent visual, verbal and tactile cues in order to complete tasks today. Performance improved when PT performed task with patient.                   PT Education - 11/12/17 1012    Education provided  Yes    Education Details  safety with steps and mobility    Person(s) Educated  Patient    Methods  Explanation;Demonstration;Tactile cues;Verbal cues    Comprehension  Returned demonstration;Tactile cues required       PT Short Term Goals - 11/12/17 1014      PT SHORT TERM GOAL #1   Title  Pt will improve 5x sit to stand to 16 seconds in order to demonstrate improved LE strength and improved dynamic balance to decrease risk for falls.    Baseline  18 sec, 10/15/17: 21.41 s,  22.48 sec    Time  6    Period  Weeks    Status  On-going    Target Date  12/08/17      PT SHORT TERM GOAL #2   Title  Pt will improve gait speed to at least 0.8 m/s, demonstrating improved LE strength in order to safely ambulate with decreased risk for fals.    Baseline  0.688 m/s, 10/15/17: 1.0 m/s, , . 62 m/sec    Time  6    Period  Weeks    Status  Achieved    Target Date  12/08/17      PT SHORT TERM GOAL #3   Title  Pt will improve TUG to at least 16 seconds in order to demonstrate improve LE strength and dynamic balance.    Baseline  19 sec; 10/15/17: 11.23s,     Time  6    Period  Weeks    Status  Achieved      PT SHORT TERM GOAL #4   Title  Pt will improve Berg Balance score to 46/56 in order to demonstarte improved dynamic and static balance in order to decrease overall falls risk.    Baseline  41/56; 51/56    Time  6    Period  Weeks    Status  Achieved        PT Long Term Goals - 11/12/17 1020      PT LONG TERM GOAL #1   Title  Pt will demonstrate independence with HEP in order to continue LE strengthening and balance interventions at home to manage symptoms and decrease  overall falls risk.    Time  12  Period  Weeks    Status  On-going    Target Date  12/08/17      PT LONG TERM GOAL #2   Title  Pt will improve 5x sit to stand to <15 seconds in order to demonstrate improved LE strength and improved balance in order to decrease falls risk.    Baseline  18 sec; 10/15/17: 21.41s, 22.48    Time  12    Period  Weeks    Status  On-going    Target Date  12/08/17      PT LONG TERM GOAL #3   Title  Pt will improve gait speed to at least 1.0 m/s in order to demonstrate improved LE strength and balance in order to ambulate with decreased risk for falls.    Baseline  0.688 m/s; 10/15/17: 1.0 m/s    Time  12    Period  Weeks    Status  Achieved      PT LONG TERM GOAL #4   Title  Pt will demonstrate an improved TUG score to < or equal to 14 seconds in order to demonstrate improved LE strength and balance in order to decrease overall falls risk.    Baseline  19 sec; 10/15/17: 11.23s    Time  12    Period  Weeks    Status  Achieved      PT LONG TERM GOAL #5   Title  Pt will demonstrate an improved Berg Balance score to at least 51/56 in order to demonstrate improved dynamic and static balance activities.    Baseline  41/56; 10/15/17: 51/56    Time  12    Period  Weeks    Status  Achieved      PT LONG TERM GOAL #6   Title  Pt will improve LE strength to 4+/5 in all limited planes in order to increase funtional abilities and improve gait.    Baseline  Gross strength assessment: 3+/5, L hip ext 2+/5, R hip ext 3-/5; 10/15/17: hip extension 3/5 bilaterally, hip abduction/adduction: 3+/5 on R side, 4-/5 on L side    Time  12    Period  Weeks    Status  On-going    Target Date  12/08/17      PT LONG TERM GOAL #7   Title  Pt will improve gait speed to at least 1.2 m/s in order to demonstrate improved LE strength and balance in order to ambulate with decreased risk for falls.    Baseline  0.688 m/s; 10/15/17: 1.0 m/s, .62 /sec    Time  8    Period  Weeks     Status  New      PT LONG TERM GOAL #8   Title  Pt will demonstrate an improved Berg Balance score to at least 54/56 in order to demonstrate improved dynamic and static balance activities.    Baseline  41/56; 10/15/17: 51/56    Time  8    Period  Weeks    Status  On-going    Target Date  12/08/17            Plan - 11/12/17 1026    Clinical Impression Statement  Patient required min verbal cueing during  exercises for  Correction of  posture and form. Patient demonstrates ability to perform strengthening exercises and dynamic standing balance exercises with no increase in pain but with moderate fatigue.Her goals were checked today with outcome measures performed.  Patient will continue to benefit  from continued skilled therapy in order to improve dynamic standing balance and increase strength in order to decrease her falls risk and improve quality of life.    Rehab Potential  Good    PT Frequency  2x / week    PT Duration  12 weeks    PT Treatment/Interventions  Aquatic Therapy;Cryotherapy;Moist Heat;Gait training;Stair training;Functional mobility training;Therapeutic activities;Therapeutic exercise;Balance training;Neuromuscular re-education;Patient/family education;Manual techniques;Passive range of motion    PT Next Visit Plan  begin LE strength and dynamic balance activities    PT Home Exercise Plan  seated marches, seated hip ABD with red theraband    Consulted and Agree with Plan of Care  Patient       Patient will benefit from skilled therapeutic intervention in order to improve the following deficits and impairments:  Abnormal gait, Decreased coordination, Decreased activity tolerance, Decreased balance, Decreased cognition, Decreased mobility, Decreased safety awareness, Decreased strength, Difficulty walking  Visit Diagnosis: Muscle weakness (generalized)  Other lack of coordination  Cognitive social or emotional deficit following cerebral  infarction  Aphasia  Unsteadiness on feet  Left temporal lobe hemorrhage (HCC) -  left temporal moderate ICH and left frontal trace SDH, concerning for traumatic ICH due to fall. However, needs to rule out CAA, tumor or CVT (vein of labbe)    G-Codes - 11/12/17 1022    Functional Assessment Tool Used (Outpatient Only)  5x sit to stand, [employer]erg Balance, 6848m WT, 6 min WT, clinical judgement    Functional Limitation  Mobility: Walking and moving around    Mobility: Walking and Moving Around Current Status [phone]([id number])  At least 40 percent but less than 60 percent impaired, limited or restricted    Mobility: Walking and Moving Around Goal Status [phone]([id number])  At least 40 percent but less than 60 percent impaired, limited or restricted       Problem List Patient Active Problem List   Diagnosis Date Noted  . Gait disturbance, post-stroke 09/04/2017  . Wernicke's aphasia 09/04/2017  . 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 However, needs to rule out CAA, tumor or CVT (vein of labbe)  09/01/2017  . Constipation 01/06/2017  . DJD (degenerative joint disease) of knee 07/18/2016  . GIB (gastrointestinal bleeding) 05/25/2016  . GI bleed 05/24/2016  . Degenerative joint disease (DJD) of hip 05/08/2016  . 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 Hypertensive heart disease   . Hemorrhage of gastrointestinal tract 11/13/2015  . Lumbosacral facet joint syndrome 07/26/2015  . Angioedema 07/13/2015  . DM2 (diabetes mellitus, type 2) (HCC) 07/13/2015  . Status post lumbar laminectomy 06/19/2015  . Lumbar radiculopathy 06/19/2015  . DDD (degenerative disc disease), lumbar 05/11/2015  . Facet syndrome, lumbar 05/11/2015  . Lumbar post-laminectomy syndrome 05/11/2015  . Sacroiliac joint disease 05/11/2015  . 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 Neuropathy due to secondary diabetes  (HCC) 05/11/2015  . Essential hypertension 01/03/2015  . Coronary atherosclerosis of native coronary artery   . PAF (paroxysmal atrial fibrillation) (HCC) 10/20/2014  . Ischemic colitis (HCC) 10/20/2014  . Diabetes mellitus type 2 with complications (HCC) 10/20/2014  . COPD (chronic obstructive pulmonary disease) (HCC) 10/20/2014  . Hyperlipidemia 10/20/2014  . GERD (gastroesophageal reflux disease) 10/20/2014  . Ingrowing nail, right great toe 04/27/2014  . Internal hemorrhoid, bleeding 07/14/2013    Ezekiel InaMansfield, Anuar Walgren S, PT DPT 11/12/2017, 10:26 AM  [facility] MAIN REHAB  SERVICES [address] [place], [place], [address] Phone: [phone]   Fax:  [phone]  Name: Yvette Collier MRN: [id number] Date of Birth: [birth year]

## 2017-11-15 NOTE — Therapy (Signed)
[facility] [facility] MAIN [facility] [address] [place], [place], [address] Phone: [phone]   Fax:  [phone]  Occupational Therapy Treatment  Patient Details  Name: Yvette Collier MRN: [id number] Date of Birth: [birth year] Referring Provider: Dr. Wynn BankerKirsteins   Encounter Date: 11/12/2017  OT End of Session - 11/15/17 1339    Visit Number  12    Number of Visits  24    Date for OT Re-Evaluation  12/08/17    OT Start Time  0916    OT Stop Time  1000    OT Time Calculation (min)  44 min    Activity Tolerance  Patient tolerated treatment well    Behavior During Therapy  [facility] for tasks assessed/performed       Past Medical History:  Diagnosis Date  . Asthma   . Chronic lower back pain   . COPD (chronic obstructive pulmonary disease) (HCC)   . Diabetes mellitus without complication (HCC)   . Gastrointestinal bleed   . GERD (gastroesophageal reflux disease)   . History of colon polyps   . Hypertension   . Hypertensive heart disease   . IBS (irritable bowel syndrome)   . Ischemic colitis (HCC)   . Non-obstructive Coronary Artery Disease    a. 05/2009 Cath [facility] minimal nonobs dzs.  . Osteoarthritis   . PAF (paroxysmal atrial fibrillation) (HCC)    a. 09/2014 during GI illness;  b. CHA2DS2VASc = 5 (not currently on OAC 2/2 h/o GIB/ischemic colitis); c. 09/2014 Echo: EF 60-65%, imparied relaxation, nl RV size/fxn.  . Transient cerebral ischemia due to atrial fibrillation [facility](HCC)     Past Surgical History:  Procedure Laterality Date  . ABDOMINAL HYSTERECTOMY    . APPENDECTOMY    . BACK SURGERY     x 3, last 2004.  . CHOLECYSTECTOMY    . COLONOSCOPY WITH PROPOFOL N/A 03/20/2017   Procedure: COLONOSCOPY WITH PROPOFOL;  Surgeon: Wyline MoodKiran Anna, MD;  [facility];  Service: Endoscopy;  Laterality: N/A;  . ESOPHAGOGASTRODUODENOSCOPY (EGD) WITH PROPOFOL N/A 03/20/2017   Procedure: ESOPHAGOGASTRODUODENOSCOPY (EGD) WITH PROPOFOL;  Surgeon:  Wyline MoodKiran Anna, MD;  Location: [facility] ENDOSCOPY;  Service: Endoscopy;  Laterality: N/A;  . FOOT SURGERY Right   . HEMORRHOID BANDING    . left total hip arthroplasty  2012  . STOMACH SURGERY    . TONSILLECTOMY      There were no vitals filed for this visit.  Subjective Assessment - 11/15/17 1338    Subjective   Patient with no complaints, ready for Thanksgiving and spending time with family.     Pertinent History  Pt. is a 76 y.o. female who suffered a Left temporla Lobe Hemorrhage secondary to Hypertensive Crisis, Moderate ICH, Left Frontal Trace SDH, and Traumatic ICH secondary to a fall. Pt. PMHX includes: HTN, DM with peripheral Neuropathy, Acute Kidney Injury, Hyperlipdidemia, and history of a GI Bleed. [facility]. [facility], was discharged, and now is ready for outpatient rehab services.    Patient Stated Goals  Patient reports she would like to do as much as she can for herself.    Currently in Pain?  No/denies    Pain Score  0-No pain    Pain Onset  More than a month ago                   OT Treatments/Exercises (OP) - 11/15/17 1343      Fine Motor Coordination   Other Fine  Motor Exercises  Patient seen for bilateral hand use for knotting and unknotting medium nylon rope with minimal resistance of knots. Cues for prehension patterns. Patient seen for manipulation of 100 pegboard with emphasis on turning/flipping items from one end to another and placing into grid with emphasis on speed of task.  Cues for technique.        Neurological Re-education Exercises   Other Exercises 1  Patient seen for green resistive putty for grip strengthening in each hand, lateral pinch, 3 point and 2 point pinch with cues for prehension patterns and technique.               OT Education - 11/15/17 1339    Education provided  Yes    Education Details  strength and ROM, coordination skills    Person(s) Educated  Patient    Methods  Explanation;Demonstration;Verbal  cues    Comprehension  Verbal cues required;Returned demonstration;Verbalized understanding          OT Long Term Goals - 11/04/17 1049      OT LONG TERM GOAL #1   Title  Pt. will be independent with basic ADL care needs.    Baseline  Eval: Pt. requires assist, 11/13: Pt. requires minA UE, ModA LE    Time  12    Period  Weeks    Status  On-going    Target Date  12/08/17      OT LONG TERM GOAL #2   Title  Pt. will be independent with light meal preparation.    Baseline  Eval: Pt. is unable, 11/04/2017: Pt. is able to assist with light cold meal prep, ahowver requires assist,.    Time  12    Period  Weeks    Status  On-going    Target Date  12/08/17      OT LONG TERM GOAL #3   Title  Pt. will be independent with tub/shower transfers    Baseline  Eval: Pt. requires assist, 11/04/2017: pt. conitnues to require assist using a shower seat.    Time  12    Period  Weeks    Status  On-going    Target Date  12/08/17      OT LONG TERM GOAL #4   Title  Pt. will increase BUE strength by 2 mm grades to assist with ADLs.    Baseline  Eval: BUE strength 4/5 overall    Time  12    Period  Weeks    Status  On-going    Target Date  12/08/17      OT LONG TERM GOAL #5   Title  Pt. will improve right [facility] skills to be able to assist with buttoning, and zipping.     Baseline  Eval: impaired, 12/08/2017: Pt. continues to require work on improving [facility] skills for buttnoning, and zipping clothing.    Time  12    Period  Weeks    Status  On-going    Target Date  12/08/17      OT LONG TERM GOAL #6   Title  Pt. will improve right grip strength to be able to open containers.    Baseline  Eval: Limited. 11/04/2017: improving    Time  12    Period  Weeks    Status  On-going    Target Date  12/08/17      OT LONG TERM GOAL #7   Title  Pt. will identify potential safety hazards accurately during ADLs, and IADLs  with 100% accuracy.    Baseline  Eval: limited 11/04/2017: Conitnues to be impaired     Time  12    Period  Weeks    Status  On-going    Target Date  12/08/17      OT LONG TERM GOAL #8   Title  Pt. will demonstrate cognitive compensatory techniques 100% of the time with minimal cues.    Baseline  Eval: limite11/13/2018: conitnues to be limited.    Time  12    Period  Weeks    Status  On-going    Target Date  12/08/17            Plan - 11/15/17 1340    Clinical Impression Statement  Patient continues to progress in all areas with strength, ROM and coordination skills.  She continues to require cues for completing tasks and is able to consistently follow 1-2 step commands.  She continues to require assist at home and supervision for safety.  Continue to work towards goals to improve independence and safety in daily tasks.     Rehab Potential  Good    OT Frequency  2x / week    OT Duration  12 weeks    OT Treatment/Interventions  Self-care/ADL training;Therapeutic exercise;DME and/or AE instruction;Patient/family education;Therapeutic activities;Therapeutic exercises;Energy conservation;Neuromuscular education    Consulted and Agree with Plan of Care  Patient       Patient will benefit from skilled therapeutic intervention in order to improve the following deficits and impairments:  Abnormal gait, Decreased activity tolerance, Decreased cognition, Decreased balance, Decreased strength, Impaired UE functional use, Decreased coordination  Visit Diagnosis: Muscle weakness (generalized)  Other lack of coordination  Cognitive social or emotional deficit following cerebral infarction    Problem List Patient Active Problem List   Diagnosis Date Noted  . Gait disturbance, post-stroke 09/04/2017  . Wernicke's aphasia 09/04/2017  . 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 However, needs to rule out CAA, tumor or CVT (vein of labbe)  09/01/2017  . Constipation 01/06/2017   . DJD (degenerative joint disease) of knee 07/18/2016  . GIB (gastrointestinal bleeding) 05/25/2016  . GI bleed 05/24/2016  . Degenerative joint disease (DJD) of hip 05/08/2016  . 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 Hypertensive heart disease   . Hemorrhage of gastrointestinal tract 11/13/2015  . Lumbosacral facet joint syndrome 07/26/2015  . Angioedema 07/13/2015  . DM2 (diabetes mellitus, type 2) (HCC) 07/13/2015  . Status post lumbar laminectomy 06/19/2015  . Lumbar radiculopathy 06/19/2015  . DDD (degenerative disc disease), lumbar 05/11/2015  . Facet syndrome, lumbar 05/11/2015  . Lumbar post-laminectomy syndrome 05/11/2015  . Sacroiliac joint disease 05/11/2015  . 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 Neuropathy due to secondary diabetes (HCC) 05/11/2015  . Essential hypertension 01/03/2015  . Coronary atherosclerosis of native coronary artery   . PAF (paroxysmal atrial fibrillation) (HCC) 10/20/2014  . Ischemic colitis (HCC) 10/20/2014  . Diabetes mellitus type 2 with complications (HCC) 10/20/2014  . COPD (chronic obstructive pulmonary disease) (HCC) 10/20/2014  . Hyperlipidemia 10/20/2014  . GERD (gastroesophageal reflux disease) 10/20/2014  . Ingrowing nail, right great toe 04/27/2014  . Internal hemorrhoid, bleeding 07/14/2013   Amy T Arne ClevelandLovett, OTR/L, CLT  Lovett,Amy 11/15/2017, 1:46 PM  [place] [facility] [address] [place], [place], [address] Phone: [phone]   Fax:  [phone]  Name:  Yvette Collier MRN: [id number] Date of Birth: [birth year]

## 2017-11-17 ENCOUNTER — Encounter: Payer: Self-pay | Admitting: Occupational Therapy

## 2017-11-17 ENCOUNTER — Ambulatory Visit: Payer: Medicare Other | Admitting: Speech Pathology

## 2017-11-17 ENCOUNTER — Encounter: Payer: Self-pay | Admitting: Speech Pathology

## 2017-11-17 ENCOUNTER — Ambulatory Visit: Payer: Medicare Other | Admitting: Occupational Therapy

## 2017-11-17 ENCOUNTER — Ambulatory Visit: Payer: Medicare Other

## 2017-11-17 ENCOUNTER — Other Ambulatory Visit: Payer: Self-pay

## 2017-11-17 DIAGNOSIS — M6281 Muscle weakness (generalized): Secondary | ICD-10-CM

## 2017-11-17 DIAGNOSIS — R4701 Aphasia: Secondary | ICD-10-CM | POA: Diagnosis not present

## 2017-11-17 DIAGNOSIS — R278 Other lack of coordination: Secondary | ICD-10-CM

## 2017-11-17 DIAGNOSIS — R2681 Unsteadiness on feet: Secondary | ICD-10-CM

## 2017-11-17 NOTE — Therapy (Signed)
[facility] MAIN [facility] SERVICES [address] [place], [place], [address] Phone: [phone]   Fax:  [phone]  Occupational Therapy Treatment  Patient Details  Name: Yvette Collier MRN: [id number] Date of Birth: 1941/11/24 Referring Provider: Dr. Wynn Banker   Encounter Date: 11/17/2017  OT End of Session - 11/17/17 1115    Visit Number  13    Number of Visits  24    Date for OT Re-Evaluation  12/08/17    OT Start Time  1110    OT Stop Time  1145    OT Time Calculation (min)  35 min    Activity Tolerance  Patient tolerated treatment well    Behavior During Therapy  [facility] [place] [facility] for tasks assessed/performed       Past Medical History:  Diagnosis Date  . Asthma   . Chronic lower back pain   . COPD (chronic obstructive pulmonary disease) (HCC)   . Diabetes mellitus without complication (HCC)   . Gastrointestinal bleed   . GERD (gastroesophageal reflux disease)   . History of colon polyps   . Hypertension   . Hypertensive heart disease   . IBS (irritable bowel syndrome)   . Ischemic colitis (HCC)   . Non-obstructive Coronary Artery Disease    a. 05/2009 [facility]): minimal nonobs dzs.  . Osteoarthritis   . PAF (paroxysmal atrial fibrillation) (HCC)    a. 09/2014 during GI illness;  b. CHA2DS2VASc = 5 (not currently on OAC 2/2 h/o GIB/ischemic colitis); c. 09/2014 Echo: EF 60-65%, imparied relaxation, nl RV size/fxn.  . Transient cerebral ischemia due to atrial fibrillation [facility])     Past Surgical History:  Procedure Laterality Date  . ABDOMINAL HYSTERECTOMY    . APPENDECTOMY    . BACK SURGERY     x 3, last 2004.  . CHOLECYSTECTOMY    . COLONOSCOPY WITH PROPOFOL N/A 03/20/2017   Procedure: COLONOSCOPY WITH PROPOFOL;  Surgeon: Wyline Mood, MD;  Location: [facility] ENDOSCOPY;  Service: Endoscopy;  Laterality: N/A;  . ESOPHAGOGASTRODUODENOSCOPY (EGD) WITH PROPOFOL N/A 03/20/2017   Procedure: ESOPHAGOGASTRODUODENOSCOPY (EGD) WITH PROPOFOL;  Surgeon:  Wyline Mood, MD;  Location: [facility] ENDOSCOPY;  Service: Endoscopy;  Laterality: N/A;  . FOOT SURGERY Right   . HEMORRHOID BANDING    . left total hip arthroplasty  2012  . STOMACH SURGERY    . TONSILLECTOMY      There were no vitals filed for this visit.  Subjective Assessment - 11/17/17 1113    Subjective   Pt.  reports she had a nice Thanksgiving.    Patient is accompained by:  Family member    Pertinent History  Pt. is a 76 y.o. female who suffered a Left temporla Lobe Hemorrhage secondary to Hypertensive Crisis, Moderate ICH, Left Frontal Trace SDH, and Traumatic ICH secondary to a fall. Pt. PMHX includes: HTN, DM with peripheral Neuropathy, Acute Kidney Injury, Hyperlipdidemia, and history of a GI Bleed. Pt. went the inpatient rehab at [facility], was discharged, and now is ready for outpatient rehab services.    Patient Stated Goals  Patient reports she would like to do as much as she can for herself.    Currently in Pain?  Yes    Pain Score  3     Pain Location  Back    Pain Descriptors / Indicators  Aching    Pain Type  Chronic pain      OT TREATMENT    Neuro muscular re-education:  Pt. performed [facility] tasks  using the Grooved pegboard. Pt. worked on grasping the grooved pegs from a horizontal position, and moving the pegs to a vertical position in the hand to prepare for placing them in the grooved slot. Pt. Worked on grasping coins from a tabletop surface, placing them into a resistive container, and pushing them through the slot while isolating his 2nd digit.   Therapeutic Exercise:  Pt. Performed hand strengthening with green theraputty. Pt. required cues for proper technique. Pt. worked on gross grip loop, lateral pinch, 3pt. pinch, gross digit extension, digit abduction loop, single digit extension loop, thumb opposition. Pt. required verbal and tactile cues for proper technique.                         OT Education - 11/17/17 1115    Education provided   Yes    Education Details  FMC, pinch strength    Person(s) Educated  Patient    Methods  Explanation    Comprehension  Verbalized understanding          OT Long Term Goals - 11/04/17 1049      OT LONG TERM GOAL #1   Title  Pt. will be independent with basic ADL care needs.    Baseline  Eval: Pt. requires assist, 11/13: Pt. requires minA UE, ModA LE    Time  12    Period  Weeks    Status  On-going    Target Date  12/08/17      OT LONG TERM GOAL #2   Title  Pt. will be independent with light meal preparation.    Baseline  Eval: Pt. is unable, 11/04/2017: Pt. is able to assist with light cold meal prep, ahowver requires assist,.    Time  12    Period  Weeks    Status  On-going    Target Date  12/08/17      OT LONG TERM GOAL #3   Title  Pt. will be independent with tub/shower transfers    Baseline  Eval: Pt. requires assist, 11/04/2017: pt. conitnues to require assist using a shower seat.    Time  12    Period  Weeks    Status  On-going    Target Date  12/08/17      OT LONG TERM GOAL #4   Title  Pt. will increase BUE strength by 2 mm grades to assist with ADLs.    Baseline  Eval: BUE strength 4/5 overall    Time  12    Period  Weeks    Status  On-going    Target Date  12/08/17      OT LONG TERM GOAL #5   Title  Pt. will improve right [facility] skills to be able to assist with buttoning, and zipping.     Baseline  Eval: impaired, 12/08/2017: Pt. continues to require work on improving [facility] skills for buttnoning, and zipping clothing.    Time  12    Period  Weeks    Status  On-going    Target Date  12/08/17      OT LONG TERM GOAL #6   Title  Pt. will improve right grip strength to be able to open containers.    Baseline  Eval: Limited. 11/04/2017: improving    Time  12    Period  Weeks    Status  On-going    Target Date  12/08/17      OT LONG TERM GOAL #7  Title  Pt. will identify potential safety hazards accurately during ADLs, and IADLs with 100% accuracy.     Baseline  Eval: limited 11/04/2017: Conitnues to be impaired    Time  12    Period  Weeks    Status  On-going    Target Date  12/08/17      OT LONG TERM GOAL #8   Title  Pt. will demonstrate cognitive compensatory techniques 100% of the time with minimal cues.    Baseline  Eval: limite11/13/2018: conitnues to be limited.    Time  12    Period  Weeks    Status  On-going    Target Date  12/08/17            Plan - 11/17/17 1116    Clinical Impression Statement  Pt. conitnues to have back discomfort, and is fatigued today. Pt. continues to present with weakness, and limited coordination skills Pt. continuues to work on improving UE functioing for improved ADLs, and IADLs.     Occupational performance deficits (Please refer to evaluation for details):  ADL's;IADL's    Rehab Potential  Good    OT Frequency  2x / week    OT Duration  12 weeks    OT Treatment/Interventions  Self-care/ADL training;Therapeutic exercise;DME and/or AE instruction;Patient/family education;Therapeutic activities;Therapeutic exercises;Energy conservation;Neuromuscular education    Consulted and Agree with Plan of Care  Patient       Patient will benefit from skilled therapeutic intervention in order to improve the following deficits and impairments:  Abnormal gait, Decreased activity tolerance, Decreased cognition, Decreased balance, Decreased strength, Impaired UE functional use, Decreased coordination  Visit Diagnosis: Muscle weakness (generalized)  Other lack of coordination    Problem List Patient Active Problem List   Diagnosis Date Noted  . Gait disturbance, post-stroke 09/04/2017  . Wernicke's aphasia 09/04/2017  . 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 However, needs to rule out CAA, tumor or CVT (vein of labbe)  09/01/2017  . Constipation 01/06/2017  . DJD (degenerative joint disease) of knee  07/18/2016  . GIB (gastrointestinal bleeding) 05/25/2016  . GI bleed 05/24/2016  . Degenerative joint disease (DJD) of hip 05/08/2016  . 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 Hypertensive heart disease   . Hemorrhage of gastrointestinal tract 11/13/2015  . Lumbosacral facet joint syndrome 07/26/2015  . Angioedema 07/13/2015  . DM2 (diabetes mellitus, type 2) (HCC) 07/13/2015  . Status post lumbar laminectomy 06/19/2015  . Lumbar radiculopathy 06/19/2015  . DDD (degenerative disc disease), lumbar 05/11/2015  . Facet syndrome, lumbar 05/11/2015  . Lumbar post-laminectomy syndrome 05/11/2015  . Sacroiliac joint disease 05/11/2015  . 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 Neuropathy due to secondary diabetes (HCC) 05/11/2015  . Essential hypertension 01/03/2015  . Coronary atherosclerosis of native coronary artery   . PAF (paroxysmal atrial fibrillation) (HCC) 10/20/2014  . Ischemic colitis (HCC) 10/20/2014  . Diabetes mellitus type 2 with complications (HCC) 10/20/2014  . COPD (chronic obstructive pulmonary disease) (HCC) 10/20/2014  . Hyperlipidemia 10/20/2014  . GERD (gastroesophageal reflux disease) 10/20/2014  . Ingrowing nail, right great toe 04/27/2014  . Internal hemorrhoid, bleeding 07/14/2013    Olegario MessierElaine Cole Eastridge, MS, OTR/L 11/17/2017, 11:31 AM  [facility] [address] [place], [place], [address] Phone: [phone]   Fax:  [phone]  Name: Yvette RakersMary L Collier MRN: [id number] Date of Birth:  09/27/1941 

## 2017-11-17 NOTE — Therapy (Signed)
[facility]IN [facility] [address] [place], [place], [address] Phone: [phone]   Fax:  [phone]  Speech Language Pathology Treatment  Patient Details  Name: Yvette Collier MRN: [id number] Date of Birth: 06/07/1941 Referring Provider: Dr. Wynn BankerKirsteins   Encounter Date: 11/17/2017  End of Session - 11/17/17 1112    Visit Number  9    Number of Visits  25    Date for SLP Re-Evaluation  12/15/17    SLP Start Time  1000    SLP Stop Time   1053    SLP Time Calculation (min)  53 min    Activity Tolerance  Patient tolerated treatment well;Other (comment)       Past Medical History:  Diagnosis Date  . Asthma   . Chronic lower back pain   . COPD (chronic obstructive pulmonary disease) (HCC)   . Diabetes mellitus without complication (HCC)   . Gastrointestinal bleed   . GERD (gastroesophageal reflux disease)   . History of colon polyps   . Hypertension   . Hypertensive heart disease   . IBS (irritable bowel syndrome)   . Ischemic colitis (HCC)   . Non-obstructive Coronary Artery Disease    a. 05/2009 [facility] minimal nonobs dzs.  . Osteoarthritis   . PAF (paroxysmal atrial fibrillation) (HCC)    a. 09/2014 during GI illness;  b. CHA2DS2VASc = 5 (not currently on OAC 2/2 h/o GIB/ischemic colitis); c. 09/2014 Echo: EF 60-65%, imparied relaxation, nl RV size/fxn.  . Transient cerebral ischemia due to atrial fibrillation [facility](HCC)     Past Surgical History:  Procedure Laterality Date  . ABDOMINAL HYSTERECTOMY    . APPENDECTOMY    . BACK SURGERY     x 3, last 2004.  . CHOLECYSTECTOMY    . COLONOSCOPY WITH PROPOFOL N/A 03/20/2017   Procedure: COLONOSCOPY WITH PROPOFOL;  Surgeon: Wyline MoodKiran Anna, MD;  [facility];  Service: Endoscopy;  Laterality: N/A;  . ESOPHAGOGASTRODUODENOSCOPY (EGD) WITH PROPOFOL N/A 03/20/2017   Procedure: ESOPHAGOGASTRODUODENOSCOPY (EGD) WITH PROPOFOL;  Surgeon: Wyline MoodKiran Anna, MD;  Location: [facility]  ENDOSCOPY;  Service: Endoscopy;  Laterality: N/A;  . FOOT SURGERY Right   . HEMORRHOID BANDING    . left total hip arthroplasty  2012  . STOMACH SURGERY    . TONSILLECTOMY      There were no vitals filed for this visit.  Subjective Assessment - 11/17/17 1111    Subjective  "It was raining"    Currently in Pain?  Yes            ADULT SLP TREATMENT - 11/17/17 0001      General Information   Behavior/Cognition  Alert;Cooperative;Pleasant mood;Requires cueing    HPI  Patient is a 76 y/o woman with left temproal lobe hemorrhage on 09/01/17.      Treatment Provided   Treatment provided  Cognitive-Linquistic      Pain Assessment   Pain Assessment  No/denies pain      Cognitive-Linquistic Treatment   Treatment focused on  Aphasia    Skilled Treatment  VERBAL EXPRESSION: Patient able to read aloud 3-letter words with 88% accuracy.  Read 3-4 word phrases after reading each word separately.  COMPREHENSION:  Follow 1-step motor commands with 100% accuracy.  Identify pictured object (f=10) with 100% accuracy given repetition of stimulus.      Assessment / Recommendations / Plan   Plan  Continue with current plan of care      Progression Toward Goals  Progression toward goals  Progressing toward goals       SLP Education - 11/17/17 1112    Education provided  Yes    Education Details  listen to yourself    Person(s) Educated  Patient    Methods  Explanation    Comprehension  Verbalized understanding         SLP Long Term Goals - 09/15/17 1329      SLP LONG TERM GOAL #1   Title  Patient will complete 2 unit processing tasks with 80% accuracy without the need of repetition of task instructions or significant delays in responding.    Time  8    Period  Weeks    Status  New    Target Date  11/15/17      SLP LONG TERM GOAL #2   Title  Patient will complete 3 unit processing tasks with 80% accuracy without the need of repetition of task instructions or significant delays  in responding.    Time  12    Period  Weeks    Status  New    Target Date  12/15/17      SLP LONG TERM GOAL #3   Title  Patient will name common objects with 50% accuracy.    Time  8    Period  Weeks    Status  New    Target Date  11/15/17      SLP LONG TERM GOAL #4   Title  Patient will name common objects with 80% accuracy.    Time  12    Period  Weeks    Status  New    Target Date  12/15/17      SLP LONG TERM GOAL #5   Title  Patient will generate meaningful phrase to complete simple/concrete linguistic task with 80% accuracy.    Time  12    Period  Weeks    Status  New    Target Date  12/15/17       Plan - 11/17/17 1113    Clinical Impression Statement  The patient is generating more intelligible and appropriate speech as her auditory comprehension is improving.    Speech Therapy Frequency  2x / week    Duration  Other (comment)    Treatment/Interventions  Language facilitation;Multimodal communcation approach;SLP instruction and feedback;Patient/family education    Potential to Achieve Goals  Good    Potential Considerations  Ability to learn/carryover information;Co-morbidities;Cooperation/participation level;Medical prognosis;Pain level;Previous level of function;Severity of impairments;Family/community support    Consulted and Agree with Plan of Care  Patient       Patient will benefit from skilled therapeutic intervention in order to improve the following deficits and impairments:   Aphasia    Problem List Patient Active Problem List   Diagnosis Date Noted  . Gait disturbance, post-stroke 09/04/2017  . Wernicke's aphasia 09/04/2017  . 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 However, needs to rule out CAA, tumor or CVT (vein of labbe)  09/01/2017  . Constipation 01/06/2017  . DJD (degenerative joint disease) of knee 07/18/2016  . GIB (gastrointestinal bleeding)  05/25/2016  . GI bleed 05/24/2016  . Degenerative joint disease (DJD) of hip 05/08/2016  . 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 Hypertensive heart disease   . Hemorrhage of gastrointestinal tract 11/13/2015  . Lumbosacral facet joint syndrome 07/26/2015  . Angioedema 07/13/2015  . DM2 (diabetes mellitus, type  2) (HCC) 07/13/2015  . Status post lumbar laminectomy 06/19/2015  . Lumbar radiculopathy 06/19/2015  . DDD (degenerative disc disease), lumbar 05/11/2015  . Facet syndrome, lumbar 05/11/2015  . Lumbar post-laminectomy syndrome 05/11/2015  . Sacroiliac joint disease 05/11/2015  . 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 Neuropathy due to secondary diabetes (HCC) 05/11/2015  . Essential hypertension 01/03/2015  . Coronary atherosclerosis of native coronary artery   . PAF (paroxysmal atrial fibrillation) (HCC) 10/20/2014  . Ischemic colitis (HCC) 10/20/2014  . Diabetes mellitus type 2 with complications (HCC) 10/20/2014  . COPD (chronic obstructive pulmonary disease) (HCC) 10/20/2014  . Hyperlipidemia 10/20/2014  . GERD (gastroesophageal reflux disease) 10/20/2014  . Ingrowing nail, right great toe 04/27/2014  . Internal hemorrhoid, bleeding 07/14/2013   Dollene Primrose, MS/CCC- SLP  Leandrew Koyanagi 11/17/2017, 11:13 AM  [facility] MAIN [facility] SERVICES [address] [place], [place], [address] Phone: [phone]   Fax:  [phone]   Name: Yvette Collier MRN: [id number] Date of Birth: Sep 05, 1941

## 2017-11-17 NOTE — Therapy (Signed)
[facility] MAIN [facility] SERVICES [address] [place], [place], [address] Phone: [phone]   Fax:  [phone]  Physical Therapy Treatment  Patient Details  Name: Yvette Collier MRN: [id number] Date of Birth: 1941-03-17 Referring Provider: BURNS, HARRIETT P   Encounter Date: 11/17/2017  PT End of Session - 11/17/17 0942    Visit Number  11    Number of Visits  25    Date for PT Re-Evaluation  12/08/17    Authorization Type  g code 1/10    PT Start Time  0932    PT Stop Time  1000    PT Time Calculation (min)  28 min    Equipment Utilized During Treatment  Gait belt    Activity Tolerance  Patient tolerated treatment well;Other (comment) Easily distracted    Behavior During Therapy  [facility] for tasks assessed/performed       Past Medical History:  Diagnosis Date  . Asthma   . Chronic lower back pain   . COPD (chronic obstructive pulmonary disease) (HCC)   . Diabetes mellitus without complication (HCC)   . Gastrointestinal bleed   . GERD (gastroesophageal reflux disease)   . History of colon polyps   . Hypertension   . Hypertensive heart disease   . IBS (irritable bowel syndrome)   . Ischemic colitis (HCC)   . Non-obstructive Coronary Artery Disease    a. 05/2009 Cath [facility]): minimal nonobs dzs.  . Osteoarthritis   . PAF (paroxysmal atrial fibrillation) (HCC)    a. 09/2014 during GI illness;  b. CHA2DS2VASc = 5 (not currently on OAC 2/2 h/o GIB/ischemic colitis); c. 09/2014 Echo: EF 60-65%, imparied relaxation, nl RV size/fxn.  . Transient cerebral ischemia due to atrial fibrillation [facility] [place] [facility])     Past Surgical History:  Procedure Laterality Date  . ABDOMINAL HYSTERECTOMY    . APPENDECTOMY    . BACK SURGERY     x 3, last 2004.  . CHOLECYSTECTOMY    . COLONOSCOPY WITH PROPOFOL N/A 03/20/2017   Procedure: COLONOSCOPY WITH PROPOFOL;  Surgeon: Wyline Mood, MD;  Location: [facility] ENDOSCOPY;  Service: Endoscopy;  Laterality: N/A;  .  ESOPHAGOGASTRODUODENOSCOPY (EGD) WITH PROPOFOL N/A 03/20/2017   Procedure: ESOPHAGOGASTRODUODENOSCOPY (EGD) WITH PROPOFOL;  Surgeon: Wyline Mood, MD;  Location: [facility] ENDOSCOPY;  Service: Endoscopy;  Laterality: N/A;  . FOOT SURGERY Right   . HEMORRHOID BANDING    . left total hip arthroplasty  2012  . STOMACH SURGERY    . TONSILLECTOMY      There were no vitals filed for this visit.  Subjective Assessment - 11/17/17 0941    Subjective  Patient having difficulty with word choice when describing her pain and answering questions.     Pertinent History  Yvette Collier a 76 y.o.right handed femalewith history of hypertension,[name],diabetes mellitus, COPD. Patient lives alone independent prior to admission and works as a Education officer, environmental. One level home. Admitted 09/01/2017 with altered mental statusright side weaknessas well as aphasia. CT of the head showed a large acute left temporal lobe hematoma approximately 15.9 mL suspect hypertensive hemorrhage.Pt presents today with fluent aphasia.    Limitations  Walking    How long can you sit comfortably?  30 minutes before needing to shift positions    How long can you stand comfortably?  about 30 minutes before needing to sit and rest    How long can you walk comfortably?  states that she has no  issues with walking    Patient Stated Goals  "get stronger"    Currently in Pain?  Yes    Pain Score  3     Pain Location  Back    Pain Orientation  Lower    Pain Descriptors / Indicators  Aching    Pain Type  Chronic pain        TherEx  Standing marches 20x  Big side steps without UE support in // bars 4x  6' step 20x toe taps without UE support 6" step side step toe taps 20x  Seated LAQ 10x 3 seconds each   5x STS: 15 seconds   Heel raises 20x  Toe raises 20x   Lunges onto half bosu ball 15x each leg BUe support tactile cueing for body mechanics  Ambulating 100 ft with CGA, cues for lifting feet  in the air.   Pt requires frequent visual, verbal and tactile cues in order to complete tasks today. Performance improved when PT performed task with patient.                       PT Education - 11/17/17 [id number]    Education provided  Yes    Education Details  strengthening     Person(s) Educated  Patient    Methods  Explanation;Demonstration;Verbal cues    Comprehension  Verbalized understanding;Returned demonstration       PT Short Term Goals - 11/12/17 1014      PT SHORT TERM GOAL #1   Title  Pt will improve 5x sit to stand to 16 seconds in order to demonstrate improved LE strength and improved dynamic balance to decrease risk for falls.    Baseline  18 sec, 10/15/17: 21.41 s,  22.48 sec    Time  6    Period  Weeks    Status  On-going    Target Date  12/08/17      PT SHORT TERM GOAL #2   Title  Pt will improve gait speed to at least 0.8 m/s, demonstrating improved LE strength in order to safely ambulate with decreased risk for fals.    Baseline  0.688 m/s, 10/15/17: 1.0 m/s, , . 62 m/sec    Time  6    Period  Weeks    Status  Achieved    Target Date  12/08/17      PT SHORT TERM GOAL #3   Title  Pt will improve TUG to at least 16 seconds in order to demonstrate improve LE strength and dynamic balance.    Baseline  19 sec; 10/15/17: 11.23s,     Time  6    Period  Weeks    Status  Achieved      PT SHORT TERM GOAL #4   Title  Pt will improve Berg Balance score to 46/56 in order to demonstarte improved dynamic and static balance in order to decrease overall falls risk.    Baseline  41/56; 51/56    Time  6    Period  Weeks    Status  Achieved        PT Long Term Goals - 11/12/17 1020      PT LONG TERM GOAL #1   Title  Pt will demonstrate independence with HEP in order to continue LE strengthening and balance interventions at home to manage symptoms and decrease overall falls risk.    Time  12    Period  Weeks    Status  On-going    Target Date   12/08/17      PT LONG TERM GOAL #2   Title  Pt will improve 5x sit to stand to <15 seconds in order to demonstrate improved LE strength and improved balance in order to decrease falls risk.    Baseline  18 sec; 10/15/17: 21.41s, 22.48    Time  12    Period  Weeks    Status  On-going    Target Date  12/08/17      PT LONG TERM GOAL #3   Title  Pt will improve gait speed to at least 1.0 m/s in order to demonstrate improved LE strength and balance in order to ambulate with decreased risk for falls.    Baseline  0.688 m/s; 10/15/17: 1.0 m/s    Time  12    Period  Weeks    Status  Achieved      PT LONG TERM GOAL #4   Title  Pt will demonstrate an improved TUG score to < or equal to 14 seconds in order to demonstrate improved LE strength and balance in order to decrease overall falls risk.    Baseline  19 sec; 10/15/17: 11.23s    Time  12    Period  Weeks    Status  Achieved      PT LONG TERM GOAL #5   Title  Pt will demonstrate an improved Berg Balance score to at least 51/56 in order to demonstrate improved dynamic and static balance activities.    Baseline  41/56; 10/15/17: 51/56    Time  12    Period  Weeks    Status  Achieved      PT LONG TERM GOAL #6   Title  Pt will improve LE strength to 4+/5 in all limited planes in order to increase funtional abilities and improve gait.    Baseline  Gross strength assessment: 3+/5, L hip ext 2+/5, R hip ext 3-/5; 10/15/17: hip extension 3/5 bilaterally, hip abduction/adduction: 3+/5 on R side, 4-/5 on L side    Time  12    Period  Weeks    Status  On-going    Target Date  12/08/17      PT LONG TERM GOAL #7   Title  Pt will improve gait speed to at least 1.2 m/s in order to demonstrate improved LE strength and balance in order to ambulate with decreased risk for falls.    Baseline  0.688 m/s; 10/15/17: 1.0 m/s, .62 /sec    Time  8    Period  Weeks    Status  New      PT LONG TERM GOAL #8   Title  Pt will demonstrate an improved Berg  Balance score to at least 54/56 in order to demonstrate improved dynamic and static balance activities.    Baseline  41/56; 10/15/17: 51/56    Time  8    Period  Weeks    Status  On-going    Target Date  12/08/17            Plan - 11/17/17 0958    Clinical Impression Statement  Patient session limited today due to pt. Arriving late. LLE weakness noted with increased fatigue upon intervention completion. Frequent tactile cueing required for task sequencing/body mechanics due to patient poor orientation to task. Patient will continue to benefit from skilled physical therapy in order to improve dynamic standing balance and increase strength in order to decrease falls.  Rehab Potential  Good    PT Frequency  2x / week    PT Duration  12 weeks    PT Treatment/Interventions  Aquatic Therapy;Cryotherapy;Moist Heat;Gait training;Stair training;Functional mobility training;Therapeutic activities;Therapeutic exercise;Balance training;Neuromuscular re-education;Patient/family education;Manual techniques;Passive range of motion    PT Next Visit Plan  begin LE strength and dynamic balance activities    PT Home Exercise Plan  seated marches, seated hip ABD with red theraband    Consulted and Agree with Plan of Care  Patient       Patient will benefit from skilled therapeutic intervention in order to improve the following deficits and impairments:  Abnormal gait, Decreased coordination, Decreased activity tolerance, Decreased balance, Decreased cognition, Decreased mobility, Decreased safety awareness, Decreased strength, Difficulty walking  Visit Diagnosis: Muscle weakness (generalized)  Other lack of coordination  Unsteadiness on feet     Problem List Patient Active Problem List   Diagnosis Date Noted  . Gait disturbance, post-stroke 09/04/2017  . Wernicke's aphasia 09/04/2017  . Left temporal lobe hemorrhage (HCC) -  left temporal moderate ICH and left frontal trace SDH, concerning  for traumatic ICH due to fall. However, needs to rule out CAA, tumor or CVT (vein of labbe)  09/01/2017  . Constipation 01/06/2017  . DJD (degenerative joint disease) of knee 07/18/2016  . GIB (gastrointestinal bleeding) 05/25/2016  . GI bleed 05/24/2016  . Degenerative joint disease (DJD) of hip 05/08/2016  . 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 Hypertensive heart disease   . Hemorrhage of gastrointestinal tract 11/13/2015  . Lumbosacral facet joint syndrome 07/26/2015  . Angioedema 07/13/2015  . DM2 (diabetes mellitus, type 2) (HCC) 07/13/2015  . Status post lumbar laminectomy 06/19/2015  . Lumbar radiculopathy 06/19/2015  . DDD (degenerative disc disease), lumbar 05/11/2015  . Facet syndrome, lumbar 05/11/2015  . Lumbar post-laminectomy syndrome 05/11/2015  . Sacroiliac joint disease 05/11/2015  . 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 Neuropathy due to secondary diabetes (HCC) 05/11/2015  . Essential hypertension 01/03/2015  . Coronary atherosclerosis of native coronary artery   . PAF (paroxysmal atrial fibrillation) (HCC) 10/20/2014  . Ischemic colitis (HCC) 10/20/2014  . Diabetes mellitus type 2 with complications (HCC) 10/20/2014  . COPD (chronic obstructive pulmonary disease) (HCC) 10/20/2014  . Hyperlipidemia 10/20/2014  . GERD (gastroesophageal reflux disease) 10/20/2014  . Ingrowing nail, right great toe 04/27/2014  . Internal hemorrhoid, bleeding 07/14/2013   Precious Bard, PT, DPT   Precious Bard 11/17/2017, 10:02 AM  [facility] MAIN [facility] SERVICES [address] [place], [place], [address] Phone: [phone]   Fax:  [phone]  Name: Yvette Collier MRN: [id number] Date of Birth: [birth year]

## 2017-11-19 ENCOUNTER — Encounter: Payer: Self-pay | Admitting: Occupational Therapy

## 2017-11-19 ENCOUNTER — Ambulatory Visit: Payer: Medicare Other | Admitting: Speech Pathology

## 2017-11-19 ENCOUNTER — Ambulatory Visit: Payer: Medicare Other

## 2017-11-19 ENCOUNTER — Other Ambulatory Visit: Payer: Self-pay

## 2017-11-19 ENCOUNTER — Encounter: Payer: Self-pay | Admitting: Speech Pathology

## 2017-11-19 ENCOUNTER — Ambulatory Visit: Payer: Medicare Other | Admitting: Occupational Therapy

## 2017-11-19 DIAGNOSIS — R4701 Aphasia: Secondary | ICD-10-CM | POA: Diagnosis not present

## 2017-11-19 DIAGNOSIS — M6281 Muscle weakness (generalized): Secondary | ICD-10-CM

## 2017-11-19 DIAGNOSIS — R2681 Unsteadiness on feet: Secondary | ICD-10-CM

## 2017-11-19 DIAGNOSIS — R278 Other lack of coordination: Secondary | ICD-10-CM

## 2017-11-19 NOTE — Therapy (Signed)
[facility] MAIN [facility] SERVICES [address] [place], [place], [address] Phone: [phone]   Fax:  [phone]  Occupational Therapy Treatment  Patient Details  Name: Yvette Collier MRN: [id number] Date of Birth: [birth year] Referring Provider: Dr. Wynn Banker   Encounter Date: 11/19/2017  OT End of Session - 11/19/17 1153    Visit Number  14    Number of Visits  24    Date for OT Re-Evaluation  12/08/17    OT Start Time  1107    OT Stop Time  1145    OT Time Calculation (min)  38 min    Activity Tolerance  Patient tolerated treatment well    Behavior During Therapy  [facility] for tasks assessed/performed       Past Medical History:  Diagnosis Date  . Asthma   . Chronic lower back pain   . COPD (chronic obstructive pulmonary disease) (HCC)   . Diabetes mellitus without complication (HCC)   . Gastrointestinal bleed   . GERD (gastroesophageal reflux disease)   . History of colon polyps   . Hypertension   . Hypertensive heart disease   . IBS (irritable bowel syndrome)   . Ischemic colitis (HCC)   . Non-obstructive Coronary Artery Disease    a. 05/2009 Cath [facility] minimal nonobs dzs.  . Osteoarthritis   . PAF (paroxysmal atrial fibrillation) (HCC)    a. 09/2014 during GI illness;  b. CHA2DS2VASc = 5 (not currently on OAC 2/2 h/o GIB/ischemic colitis); c. 09/2014 Echo: EF 60-65%, imparied relaxation, nl RV size/fxn.  . Transient cerebral ischemia due to atrial fibrillation [facility])     Past Surgical History:  Procedure Laterality Date  . ABDOMINAL HYSTERECTOMY    . APPENDECTOMY    . BACK SURGERY     x 3, last 2004.  . CHOLECYSTECTOMY    . COLONOSCOPY WITH PROPOFOL N/A 03/20/2017   Procedure: COLONOSCOPY WITH PROPOFOL;  Surgeon: Wyline Mood, MD;  Location: [facility] ENDOSCOPY;  Service: Endoscopy;  Laterality: N/A;  . ESOPHAGOGASTRODUODENOSCOPY (EGD) WITH PROPOFOL N/A 03/20/2017   Procedure: ESOPHAGOGASTRODUODENOSCOPY (EGD) WITH PROPOFOL;  Surgeon:  Wyline Mood, MD;  Location: [facility] ENDOSCOPY;  Service: Endoscopy;  Laterality: N/A;  . FOOT SURGERY Right   . HEMORRHOID BANDING    . left total hip arthroplasty  2012  . STOMACH SURGERY    . TONSILLECTOMY      There were no vitals filed for this visit.    OT TREATMENT    Therapeutic Exercise:  Pt. Worked on UE strengthening with red theraband for shoulder flexion, horizontal abduction, diagonal shoulder flexion, elbow extension. 2# dumbbell ex. for elbow flexion and extension, forearm supination/pronation, wrist flexion/extension, and radial deviation. 10-15 reps each. Pt. Tolerated them well. Pt. requires consistent verbal cues, and assist for proper technique. Pt. performed gross gripping with grip strengthener. Pt. worked on sustaining grip while grasping pegs and reaching at various heights. Gripper was placed in the 2nd resistive slot with the Thornton resistive spring.                              OT Long Term Goals - 11/04/17 1049      OT LONG TERM GOAL #1   Title  Pt. will be independent with basic ADL care needs.    Baseline  Eval: Pt. requires assist, 11/13: Pt. requires minA UE, ModA LE    Time  12    Period  Weeks  Status  On-going    Target Date  12/08/17      OT LONG TERM GOAL #2   Title  Pt. will be independent with light meal preparation.    Baseline  Eval: Pt. is unable, 11/04/2017: Pt. is able to assist with light cold meal prep, ahowver requires assist,.    Time  12    Period  Weeks    Status  On-going    Target Date  12/08/17      OT LONG TERM GOAL #3   Title  Pt. will be independent with tub/shower transfers    Baseline  Eval: Pt. requires assist, 11/04/2017: pt. conitnues to require assist using a shower seat.    Time  12    Period  Weeks    Status  On-going    Target Date  12/08/17      OT LONG TERM GOAL #4   Title  Pt. will increase BUE strength by 2 mm grades to assist with ADLs.    Baseline  Eval: BUE strength 4/5  overall    Time  12    Period  Weeks    Status  On-going    Target Date  12/08/17      OT LONG TERM GOAL #5   Title  Pt. will improve right [facility] skills to be able to assist with buttoning, and zipping.     Baseline  Eval: impaired, 12/08/2017: Pt. continues to require work on improving [facility] skills for buttnoning, and zipping clothing.    Time  12    Period  Weeks    Status  On-going    Target Date  12/08/17      OT LONG TERM GOAL #6   Title  Pt. will improve right grip strength to be able to open containers.    Baseline  Eval: Limited. 11/04/2017: improving    Time  12    Period  Weeks    Status  On-going    Target Date  12/08/17      OT LONG TERM GOAL #7   Title  Pt. will identify potential safety hazards accurately during ADLs, and IADLs with 100% accuracy.    Baseline  Eval: limited 11/04/2017: Conitnues to be impaired    Time  12    Period  Weeks    Status  On-going    Target Date  12/08/17      OT LONG TERM GOAL #8   Title  Pt. will demonstrate cognitive compensatory techniques 100% of the time with minimal cues.    Baseline  Eval: limite11/13/2018: conitnues to be limited.    Time  12    Period  Weeks    Status  On-going    Target Date  12/08/17            Plan - 11/19/17 1153    Clinical Impression Statement  --       Patient will benefit from skilled therapeutic intervention in order to improve the following deficits and impairments:  Abnormal gait, Decreased activity tolerance, Decreased cognition, Decreased balance, Decreased strength, Impaired UE functional use, Decreased coordination  Visit Diagnosis: Muscle weakness (generalized)    Problem List Patient Active Problem List   Diagnosis Date Noted  . Gait disturbance, post-stroke 09/04/2017  . Wernicke's aphasia 09/04/2017  . 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 However, needs to rule out CAA, tumor  or CVT (vein of labbe)  09/01/2017  . Constipation 01/06/2017  . DJD (degenerative joint disease) of knee 07/18/2016  . GIB (gastrointestinal bleeding) 05/25/2016  . GI bleed 05/24/2016  . Degenerative joint disease (DJD) of hip 05/08/2016  . 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 Hypertensive heart disease   . Hemorrhage of gastrointestinal tract 11/13/2015  . Lumbosacral facet joint syndrome 07/26/2015  . Angioedema 07/13/2015  . DM2 (diabetes mellitus, type 2) (HCC) 07/13/2015  . Status post lumbar laminectomy 06/19/2015  . Lumbar radiculopathy 06/19/2015  . DDD (degenerative disc disease), lumbar 05/11/2015  . Facet syndrome, lumbar 05/11/2015  . Lumbar post-laminectomy syndrome 05/11/2015  . Sacroiliac joint disease 05/11/2015  . 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 Neuropathy due to secondary diabetes (HCC) 05/11/2015  . Essential hypertension 01/03/2015  . Coronary atherosclerosis of native coronary artery   . PAF (paroxysmal atrial fibrillation) (HCC) 10/20/2014  . Ischemic colitis (HCC) 10/20/2014  . Diabetes mellitus type 2 with complications (HCC) 10/20/2014  . COPD (chronic obstructive pulmonary disease) (HCC) 10/20/2014  . Hyperlipidemia 10/20/2014  . GERD (gastroesophageal reflux disease) 10/20/2014  . Ingrowing nail, right great toe 04/27/2014  . Internal hemorrhoid, bleeding 07/14/2013    Olegario MessierElaine Maisley Hainsworth, MS, OTR/L 11/19/2017, 11:56 AM  [facility] MAIN [facility] SERVICES [address] [place], [place], [address] Phone: [phone]   Fax:  [phone]  Name: Tomma RakersMary L Mackintosh MRN: [id number] Date of Birth: 1941/06/02

## 2017-11-19 NOTE — Therapy (Signed)
[place] MAIN [facility] SERVICES [address] [place], [place], [address] Phone: [phone]   Fax:  [phone]  Speech Language Pathology Treatment/Re-Certification  Patient Details  Name: Yvette Collier MRN: [id number] Date of Birth: May 30, 1941 Referring Provider: Dr. Letta Pate   Encounter Date: 11/19/2017  End of Session - 11/19/17 1135    Visit Number  10    Number of Visits  25    Date for SLP Re-Evaluation  01/19/18    SLP Start Time  1000    SLP Stop Time   1100    SLP Time Calculation (min)  60 min    Activity Tolerance  Patient tolerated treatment well       Past Medical History:  Diagnosis Date  . Asthma   . Chronic lower back pain   . COPD (chronic obstructive pulmonary disease) ([place])   . Diabetes mellitus without complication ([place])   . Gastrointestinal bleed   . GERD (gastroesophageal reflux disease)   . History of colon polyps   . Hypertension   . Hypertensive heart disease   . IBS (irritable bowel syndrome)   . Ischemic colitis ([place])   . Non-obstructive Coronary Artery Disease    a. 05/2009 Cath [facility]): minimal nonobs dzs.  . Osteoarthritis   . PAF (paroxysmal atrial fibrillation) ([place])    a. 09/2014 during GI illness;  b. CHA2DS2VASc = 5 (not currently on [place] 2/2 h/o GIB/ischemic colitis); c. 09/2014 Echo: EF 60-65%, imparied relaxation, nl RV size/fxn.  . Transient cerebral ischemia due to atrial fibrillation [facility])     Past Surgical History:  Procedure Laterality Date  . ABDOMINAL HYSTERECTOMY    . APPENDECTOMY    . BACK SURGERY     x 3, last 2004.  . CHOLECYSTECTOMY    . COLONOSCOPY WITH PROPOFOL N/A 03/20/2017   Procedure: COLONOSCOPY WITH PROPOFOL;  Surgeon: Jonathon Bellows, MD;  Location: [facility] ENDOSCOPY;  Service: Endoscopy;  Laterality: N/A;  . ESOPHAGOGASTRODUODENOSCOPY (EGD) WITH PROPOFOL N/A 03/20/2017   Procedure: ESOPHAGOGASTRODUODENOSCOPY (EGD) WITH PROPOFOL;  Surgeon: Jonathon Bellows, MD;  Location: [facility]  ENDOSCOPY;  Service: Endoscopy;  Laterality: N/A;  . FOOT SURGERY Right   . HEMORRHOID BANDING    . left total hip arthroplasty  2012  . STOMACH SURGERY    . TONSILLECTOMY      There were no vitals filed for this visit.  Subjective Assessment - 11/19/17 1134    Subjective  "That's a good one"    Currently in Pain?  No/denies            ADULT SLP TREATMENT - 11/19/17 0001      General Information   Behavior/Cognition  Alert;Cooperative;Pleasant mood;Requires cueing    HPI  Patient is a 76 y/o woman with left temproal lobe hemorrhage on 09/01/17.      Treatment Provided   Treatment provided  Cognitive-Linquistic      Pain Assessment   Pain Assessment  No/denies pain      Cognitive-Linquistic Treatment   Treatment focused on  Aphasia    Skilled Treatment  VERBAL EXPRESSION: Patient able to read aloud 3-letter words with 88% accuracy.  Read aloud 5-letter words with 70% accuracy.  Read 3 word phrases independently with 70% accuracy.  COMPREHENSION:  Complete 2 unit processing tasks with 100% accuracy.  Identify pictured object (f=10) with 100% accuracy given repetition of stimulus.      Assessment / Recommendations / Plan   Plan  Continue with current plan of care  Progression Toward Goals   Progression toward goals  Progressing toward goals       SLP Education - 11/19/17 1134    Education provided  Yes    Education Details  auditory comprhension is getting better    Person(s) Educated  Patient    Methods  Explanation    Comprehension  Verbalized understanding         SLP Long Term Goals - 11/19/17 1138      SLP LONG TERM GOAL #1   Title  Patient will complete 2 unit processing tasks with 80% accuracy without the need of repetition of task instructions or significant delays in responding.    Status  Achieved      SLP LONG TERM GOAL #2   Title  Patient will complete 3 unit processing tasks with 80% accuracy without the need of repetition of task  instructions or significant delays in responding.    Status  Partially Met      SLP LONG TERM GOAL #3   Title  Patient will name common objects with 50% accuracy.    Status  Not Met      SLP LONG TERM GOAL #4   Title  Patient will name common objects with 80% accuracy.    Status  Not Met      SLP LONG TERM GOAL #5   Title  Patient will generate meaningful phrase to complete simple/concrete linguistic task with 80% accuracy.    Status  Not Met      Additional Long Term Goals   Additional Long Term Goals  Yes      SLP LONG TERM GOAL #6   Title  Patient will read aloud and follow written commands with 80% accuracy    Time  8    Period  Weeks    Status  New    Target Date  01/19/18      SLP LONG TERM GOAL #7   Title  Patient will name pictured object given written phrase completion stimulus with 80% accuracy.    Time  8    Period  Weeks    Status  New    Target Date  01/19/18       Plan - 11/19/17 1137    Clinical Impression Statement  The patient is improving in overall communicative effectiveness and has met her comprehension goal.  She continues to display phonemic paraphasias, neologisms, and perseverations in open-ended verbal expression tasks.  She continues to improve in reading aloud and can now read 3 word phrases.  Will continue ST with updated goals to reflect and take advantage of reading fluency/comprehension in combination with verbal expression tasks.    Speech Therapy Frequency  2x / week    Duration  Other (comment) 8 weeks    Treatment/Interventions  Language facilitation;Multimodal communcation approach;SLP instruction and feedback;Patient/family education    Potential to Achieve Goals  Good    Potential Considerations  Ability to learn/carryover information;Co-morbidities;Cooperation/participation level;Medical prognosis;Pain level;Previous level of function;Severity of impairments;Family/community support    SLP Home Exercise Plan  read aloud 3- and 4-letter  words    Consulted and Agree with Plan of Care  Patient       Patient will benefit from skilled therapeutic intervention in order to improve the following deficits and impairments:   Aphasia - Plan: SLP plan of care cert/re-cert  G-Codes - 62/86/38 1143    Functional Assessment Tool Used  clinical judgment    Functional Limitations  Spoken language comprehension  Spoken Language Comprehension Current Status [phone])  At least 40 percent but less than 60 percent impaired, limited or restricted    Spoken Language Comprehension Goal Status (G9160)  At least 20 percent but less than 40 percent impaired, limited or restricted       Problem List Patient Active Problem List   Diagnosis Date Noted  . Gait disturbance, post-stroke 09/04/2017  . Wernicke's aphasia 09/04/2017  . 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 However, needs to rule out CAA, tumor or CVT (vein of labbe)  09/01/2017  . Constipation 01/06/2017  . DJD (degenerative joint disease) of knee 07/18/2016  . GIB (gastrointestinal bleeding) 05/25/2016  . GI bleed 05/24/2016  . Degenerative joint disease (DJD) of hip 05/08/2016  . 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 Hypertensive heart disease   . Hemorrhage of gastrointestinal tract 11/13/2015  . Lumbosacral facet joint syndrome 07/26/2015  . Angioedema 07/13/2015  . DM2 (diabetes mellitus, type 2) ([employer]) 07/13/2015  . Status post lumbar laminectomy 06/19/2015  . Lumbar radiculopathy 06/19/2015  . DDD (degenerative disc disease), lumbar 05/11/2015  . Facet syndrome, lumbar 05/11/2015  . Lumbar post-laminectomy syndrome 05/11/2015  . Sacroiliac joint disease 05/11/2015  . 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 Neuropathy due to secondary diabetes ([place]) 05/11/2015  . Essential hypertension 01/03/2015  . Coronary atherosclerosis of native  coronary artery   . PAF (paroxysmal atrial fibrillation) ([place]) 10/20/2014  . Ischemic colitis ([place]) 10/20/2014  . Diabetes mellitus type 2 with complications ([place]) 23/53/6144  . COPD (chronic obstructive pulmonary disease) ([place]) 10/20/2014  . Hyperlipidemia 10/20/2014  . GERD (gastroesophageal reflux disease) 10/20/2014  . Ingrowing nail, right great toe 04/27/2014  . Internal hemorrhoid, bleeding 07/14/2013   Leroy Sea, MS/CCC- SLP  Lou Miner 11/19/2017, 11:46 AM  [place] MAIN [facility] SERVICES [address] [place], [place], [address] Phone: [phone]   Fax:  [phone]   Name: Yvette Collier MRN: [id number] Date of Birth: 26-Sep-1941

## 2017-11-19 NOTE — Therapy (Signed)
[place] [facility] [address] [place], [place], [address] Phone: [phone]   Fax:  [phone]  Physical Therapy Treatment  Patient Details  Name: Yvette RakersMary L Collier MRN: [id number] Date of Birth: 1941/10/08 Referring Provider: BURNS, HARRIETT P   Encounter Date: 11/19/2017  PT End of Session - 11/19/17 0950    Visit Number  12    Number of Visits  25    Date for PT Re-Evaluation  12/08/17    Authorization Type  g code 2/10    PT Start Time  0915    PT Stop Time  1000    PT Time Calculation (min)  45 min    Equipment Utilized During Treatment  Gait belt    Activity Tolerance  Patient tolerated treatment well;Other (comment) Easily distracted    Behavior During Therapy  [facility] for tasks assessed/performed       Past Medical History:  Diagnosis Date  . Asthma   . Chronic lower back pain   . COPD (chronic obstructive pulmonary disease) (HCC)   . Diabetes mellitus without complication (HCC)   . Gastrointestinal bleed   . GERD (gastroesophageal reflux disease)   . History of colon polyps   . Hypertension   . Hypertensive heart disease   . IBS (irritable bowel syndrome)   . Ischemic colitis (HCC)   . Non-obstructive Coronary Artery Disease    a. 05/2009 Cath [facility]): minimal nonobs dzs.  . Osteoarthritis   . PAF (paroxysmal atrial fibrillation) (HCC)    a. 09/2014 during GI illness;  b. CHA2DS2VASc = 5 (not currently on OAC 2/2 h/o GIB/ischemic colitis); c. 09/2014 Echo: EF 60-65%, imparied relaxation, nl RV size/fxn.  . Transient cerebral ischemia due to atrial fibrillation [facility](HCC)     Past Surgical History:  Procedure Laterality Date  . ABDOMINAL HYSTERECTOMY    . APPENDECTOMY    . BACK SURGERY     x 3, last 2004.  . CHOLECYSTECTOMY    . COLONOSCOPY WITH PROPOFOL N/A 03/20/2017   Procedure: COLONOSCOPY WITH PROPOFOL;  Surgeon: Wyline MoodKiran Anna, MD;  [facility];  Service: Endoscopy;  Laterality: N/A;  .  ESOPHAGOGASTRODUODENOSCOPY (EGD) WITH PROPOFOL N/A 03/20/2017   Procedure: ESOPHAGOGASTRODUODENOSCOPY (EGD) WITH PROPOFOL;  Surgeon: Wyline MoodKiran Anna, MD;  Location: [facility] ENDOSCOPY;  Service: Endoscopy;  Laterality: N/A;  . FOOT SURGERY Right   . HEMORRHOID BANDING    . left total hip arthroplasty  2012  . STOMACH SURGERY    . TONSILLECTOMY      There were no vitals filed for this visit.  Subjective Assessment - 11/19/17 0917    Subjective  Patient family member reports pt.'s IBS is acting up today but pt. has had two anti-diahreatic pills.     Pertinent History  Yvette LandMary L Whiteis a 76 y.o.right handed femalewith history of hypertension,[name],diabetes mellitus, COPD. Patient lives alone independent prior to admission and works as a Education officer, environmentalpastor. One level home. Admitted 09/01/2017 with altered mental statusright side weaknessas well as aphasia. CT of the head showed a large acute left temporal lobe hematoma approximately 15.9 mL suspect hypertensive hemorrhage.Pt presents today with fluent aphasia.    Limitations  Walking    How long can you sit comfortably?  30 minutes before needing to shift positions    How long can you stand comfortably?  about 30 minutes before needing to sit and rest    How long can you walk comfortably?  states  that she has no issues with walking    Patient Stated Goals  "get stronger"    Currently in Pain?  Yes    Pain Score  3     Pain Location  Back    Pain Orientation  Left;Lower    Pain Descriptors / Indicators  Aching    Pain Type  Chronic pain    Pain Radiating Towards  wraps to front of groin    Pain Onset  Today    Pain Frequency  Intermittent        Ther-ex    Sit to stand practice without UE support with 2.5# overhead bar press when in standing 2 x 10   4lb ankle weights        Seated marches 2x20        Seated LAQ 2x15  In // bars:         GTB around ankles side stepping 6x length of bars          Standing hip flexion 10x        Standing hip extension 10x          High knee marches 6x length of bars  Ambulate 200 ft in hall with CGA and verbal cues for lifting LE's  Neuromuscular training:  Blue balance beam:             Side stepping 5x L and R 6x            Tandem walking 6x Airex pad:         Tap 6: stool 30x         Raising weighted ball 2x10( Gr 2000)     Pt requires frequent visual, verbal and tactile cues in order to complete tasks today. Difficulty following commands, easily forgets what exercise she was doing. Increased time required to perform outcome measures due to need for demonstration and heavy cueing.                        PT Education - 11/19/17 0919    Education provided  Yes    Education Details  functional strength and balance    Person(s) Educated  Patient    Methods  Explanation;Demonstration;Verbal cues    Comprehension  Verbalized understanding;Returned demonstration       PT Short Term Goals - 11/12/17 1014      PT SHORT TERM GOAL #1   Title  Pt will improve 5x sit to stand to 16 seconds in order to demonstrate improved LE strength and improved dynamic balance to decrease risk for falls.    Baseline  18 sec, 10/15/17: 21.41 s,  22.48 sec    Time  6    Period  Weeks    Status  On-going    Target Date  12/08/17      PT SHORT TERM GOAL #2   Title  Pt will improve gait speed to at least 0.8 m/s, demonstrating improved LE strength in order to safely ambulate with decreased risk for fals.    Baseline  0.688 m/s, 10/15/17: 1.0 m/s, , . 62 m/sec    Time  6    Period  Weeks    Status  Achieved    Target Date  12/08/17      PT SHORT TERM GOAL #3   Title  Pt will improve TUG to at least 16 seconds in order to demonstrate improve LE strength and dynamic balance.  Baseline  19 sec; 10/15/17: 11.23s,     Time  6    Period  Weeks    Status  Achieved      PT SHORT TERM GOAL #4   Title  Pt will improve Berg Balance score to  46/56 in order to demonstarte improved dynamic and static balance in order to decrease overall falls risk.    Baseline  41/56; 51/56    Time  6    Period  Weeks    Status  Achieved        PT Long Term Goals - 11/12/17 1020      PT LONG TERM GOAL #1   Title  Pt will demonstrate independence with HEP in order to continue LE strengthening and balance interventions at home to manage symptoms and decrease overall falls risk.    Time  12    Period  Weeks    Status  On-going    Target Date  12/08/17      PT LONG TERM GOAL #2   Title  Pt will improve 5x sit to stand to <15 seconds in order to demonstrate improved LE strength and improved balance in order to decrease falls risk.    Baseline  18 sec; 10/15/17: 21.41s, 22.48    Time  12    Period  Weeks    Status  On-going    Target Date  12/08/17      PT LONG TERM GOAL #3   Title  Pt will improve gait speed to at least 1.0 m/s in order to demonstrate improved LE strength and balance in order to ambulate with decreased risk for falls.    Baseline  0.688 m/s; 10/15/17: 1.0 m/s    Time  12    Period  Weeks    Status  Achieved      PT LONG TERM GOAL #4   Title  Pt will demonstrate an improved TUG score to < or equal to 14 seconds in order to demonstrate improved LE strength and balance in order to decrease overall falls risk.    Baseline  19 sec; 10/15/17: 11.23s    Time  12    Period  Weeks    Status  Achieved      PT LONG TERM GOAL #5   Title  Pt will demonstrate an improved Berg Balance score to at least 51/56 in order to demonstrate improved dynamic and static balance activities.    Baseline  41/56; 10/15/17: 51/56    Time  12    Period  Weeks    Status  Achieved      PT LONG TERM GOAL #6   Title  Pt will improve LE strength to 4+/5 in all limited planes in order to increase funtional abilities and improve gait.    Baseline  Gross strength assessment: 3+/5, L hip ext 2+/5, R hip ext 3-/5; 10/15/17: hip extension 3/5  bilaterally, hip abduction/adduction: 3+/5 on R side, 4-/5 on L side    Time  12    Period  Weeks    Status  On-going    Target Date  12/08/17      PT LONG TERM GOAL #7   Title  Pt will improve gait speed to at least 1.2 m/s in order to demonstrate improved LE strength and balance in order to ambulate with decreased risk for falls.    Baseline  0.688 m/s; 10/15/17: 1.0 m/s, .62 /sec    Time  8  Period  Weeks    Status  New      PT LONG TERM GOAL #8   Title  Pt will demonstrate an improved Berg Balance score to at least 54/56 in order to demonstrate improved dynamic and static balance activities.    Baseline  41/56; 10/15/17: 51/56    Time  8    Period  Weeks    Status  On-going    Target Date  12/08/17            Plan - 11/19/17 0940    Clinical Impression Statement   Patient fatigued in ankles quickly when performing dynamic balance on unstable surfaces. Pt. Strength increased in seated interventions as noted with increased ankle weight. Patient will continue to improve from skilled physical therapy in order to improve dynamic standing balance and increase strength in order to decrease falls.     Rehab Potential  Good    PT Frequency  2x / week    PT Duration  12 weeks    PT Treatment/Interventions  Aquatic Therapy;Cryotherapy;Moist Heat;Gait training;Stair training;Functional mobility training;Therapeutic activities;Therapeutic exercise;Balance training;Neuromuscular re-education;Patient/family education;Manual techniques;Passive range of motion    PT Next Visit Plan  begin LE strength and dynamic balance activities    PT Home Exercise Plan  seated marches, seated hip ABD with red theraband    Consulted and Agree with Plan of Care  Patient       Patient will benefit from skilled therapeutic intervention in order to improve the following deficits and impairments:  Abnormal gait, Decreased coordination, Decreased activity tolerance, Decreased balance, Decreased cognition,  Decreased mobility, Decreased safety awareness, Decreased strength, Difficulty walking  Visit Diagnosis: Muscle weakness (generalized)  Other lack of coordination  Unsteadiness on feet     Problem List Patient Active Problem List   Diagnosis Date Noted  . Gait disturbance, post-stroke 09/04/2017  . Wernicke's aphasia 09/04/2017  . 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 However, needs to rule out CAA, tumor or CVT (vein of labbe)  09/01/2017  . Constipation 01/06/2017  . DJD (degenerative joint disease) of knee 07/18/2016  . GIB (gastrointestinal bleeding) 05/25/2016  . GI bleed 05/24/2016  . Degenerative joint disease (DJD) of hip 05/08/2016  . 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 Hypertensive heart disease   . Hemorrhage of gastrointestinal tract 11/13/2015  . Lumbosacral facet joint syndrome 07/26/2015  . Angioedema 07/13/2015  . DM2 (diabetes mellitus, type 2) (HCC) 07/13/2015  . Status post lumbar laminectomy 06/19/2015  . Lumbar radiculopathy 06/19/2015  . DDD (degenerative disc disease), lumbar 05/11/2015  . Facet syndrome, lumbar 05/11/2015  . Lumbar post-laminectomy syndrome 05/11/2015  . Sacroiliac joint disease 05/11/2015  . 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 Neuropathy due to secondary diabetes (HCC) 05/11/2015  . Essential hypertension 01/03/2015  . Coronary atherosclerosis of native coronary artery   . PAF (paroxysmal atrial fibrillation) (HCC) 10/20/2014  . Ischemic colitis (HCC) 10/20/2014  . Diabetes mellitus type 2 with complications (HCC) 10/20/2014  . COPD (chronic obstructive pulmonary disease) (HCC) 10/20/2014  . Hyperlipidemia 10/20/2014  . GERD (gastroesophageal reflux disease) 10/20/2014  . Ingrowing nail, right great toe 04/27/2014  . Internal hemorrhoid, bleeding 07/14/2013   Precious Bard, PT, DPT   Precious Bard 11/19/2017, 10:00 AM  [place] [facility] MAIN [facility] SERVICES [address] [place], [place], [address] Phone: [phone]   Fax:  [phone]  Name: VONETTA FOULK MRN: [id number] Date of Birth: 1941-10-02

## 2017-11-21 ENCOUNTER — Other Ambulatory Visit: Payer: Self-pay | Admitting: Gastroenterology

## 2017-11-24 ENCOUNTER — Ambulatory Visit: Payer: Medicare Other | Admitting: Occupational Therapy

## 2017-11-24 ENCOUNTER — Ambulatory Visit: Payer: Medicare Other

## 2017-11-24 ENCOUNTER — Other Ambulatory Visit: Payer: Self-pay

## 2017-11-24 ENCOUNTER — Ambulatory Visit: Payer: Medicare Other | Attending: Physical Medicine & Rehabilitation | Admitting: Speech Pathology

## 2017-11-24 ENCOUNTER — Encounter: Payer: Self-pay | Admitting: Speech Pathology

## 2017-11-24 DIAGNOSIS — R4701 Aphasia: Secondary | ICD-10-CM | POA: Insufficient documentation

## 2017-11-24 DIAGNOSIS — R2681 Unsteadiness on feet: Secondary | ICD-10-CM | POA: Insufficient documentation

## 2017-11-24 DIAGNOSIS — R278 Other lack of coordination: Secondary | ICD-10-CM

## 2017-11-24 DIAGNOSIS — M6281 Muscle weakness (generalized): Secondary | ICD-10-CM | POA: Diagnosis present

## 2017-11-24 NOTE — Therapy (Signed)
Yvette Collier: [phone]   Fax:  [phone]  Physical Therapy Treatment  Patient Details  Name: Yvette Collier MRN: [id number] Date of Birth: 21-Dec-1941 Referring Provider: BURNS, HARRIETT P   Encounter Date: 11/24/2017  PT End of Session - 11/24/17 1133    Visit Number  13    Number of Visits  25    Date for PT Re-Evaluation  12/08/17    Authorization Type  g code 3/10    PT Start Time  1115    PT Stop Time  1200    PT Time Calculation (min)  45 min    Equipment Utilized During Treatment  Gait belt    Activity Tolerance  Patient tolerated treatment well;Other (comment) Easily distracted    Behavior During Therapy  [facility] for tasks assessed/performed       Past Medical History:  Diagnosis Date  . Asthma   . Chronic lower back pain   . COPD (chronic obstructive pulmonary disease) (HCC)   . Diabetes mellitus without complication (HCC)   . Gastrointestinal bleed   . GERD (gastroesophageal reflux disease)   . History of colon polyps   . Hypertension   . Hypertensive heart disease   . IBS (irritable bowel syndrome)   . Ischemic colitis (HCC)   . Non-obstructive Coronary Artery Disease    a. 05/2009 Cath [facility]): minimal nonobs dzs.  . Osteoarthritis   . PAF (paroxysmal atrial fibrillation) (HCC)    a. 09/2014 during GI illness;  b. CHA2DS2VASc = 5 (not currently on OAC 2/2 h/o GIB/ischemic colitis); c. 09/2014 Echo: EF 60-65%, imparied relaxation, nl RV size/fxn.  . Transient cerebral ischemia due to atrial fibrillation [facility](HCC)     Past Surgical History:  Procedure Laterality Date  . ABDOMINAL HYSTERECTOMY    . APPENDECTOMY    . BACK SURGERY     x 3, last 2004.  . CHOLECYSTECTOMY    . COLONOSCOPY WITH PROPOFOL N/A 03/20/2017   Procedure: COLONOSCOPY WITH PROPOFOL;  Surgeon: Wyline MoodKiran Anna, MD;  [facility];  Service: Endoscopy;  Laterality: N/A;  .  ESOPHAGOGASTRODUODENOSCOPY (EGD) WITH PROPOFOL N/A 03/20/2017   Procedure: ESOPHAGOGASTRODUODENOSCOPY (EGD) WITH PROPOFOL;  Surgeon: Wyline MoodKiran Anna, MD;  Location: [facility] ENDOSCOPY;  Service: Endoscopy;  Laterality: N/A;  . FOOT SURGERY Right   . HEMORRHOID BANDING    . left total hip arthroplasty  2012  . STOMACH SURGERY    . TONSILLECTOMY      There were no vitals filed for this visit.  Subjective Assessment - 11/24/17 1124    Subjective  Pt's family member reports patient has been having increased confusion this morning. Did not recognize house as her own. Reports no falls or LOB, was able to wash dishes over the weekend.     Pertinent History  Yvette Collier a 76 y.o.right handed femalewith history of hypertension,[name],diabetes mellitus, COPD. Patient lives alone independent prior to admission and works as a Education officer, environmentalpastor. One level home. Admitted 09/01/2017 with altered mental statusright side weaknessas well as aphasia. CT of the head showed a large acute left temporal lobe hematoma approximately 15.9 mL suspect hypertensive hemorrhage.Pt presents today with fluent aphasia.    Limitations  Walking    How long can you sit comfortably?  30 minutes before needing to shift positions    How long can you stand comfortably?  about 30 minutes before needing  to sit and rest    How long can you walk comfortably?  states that she has no issues with walking    Patient Stated Goals  "get stronger"    Currently in Pain?  No/denies       Nustep lvl 4 for 4 minutes (no charge)   TherEx   Standing marches 20x  6' step 20x toe taps without UE support 6" step side step toe taps 15x, patient incorporated side step shuffle between with humming music.    Seated LAQ 10x 3 seconds each    Heel raises 20x   Toe raises 20x    Airex balance beam:  side step 6x with two step patterning L and R with two steps forward and two steps backwards.    Airex pad: two  step with rotation dancing on airex pad, lifting legs and opp UE humming/singing song without LOB. CGA.    Pt requires frequent visual, verbal and tactile cues in order to complete tasks today. Performance improved when PT performed task with patient.                          PT Education - 11/24/17 1129    Education provided  Yes    Education Details  functional strength and balance    Person(s) Educated  Patient    Methods  Explanation;Demonstration;Verbal cues    Comprehension  Verbalized understanding;Returned demonstration       PT Short Term Goals - 11/12/17 1014      PT SHORT TERM GOAL #1   Title  Pt will improve 5x sit to stand to 16 seconds in order to demonstrate improved LE strength and improved dynamic balance to decrease risk for falls.    Baseline  18 sec, 10/15/17: 21.41 s,  22.48 sec    Time  6    Period  Weeks    Status  On-going    Target Date  12/08/17      PT SHORT TERM GOAL #2   Title  Pt will improve gait speed to at least 0.8 m/s, demonstrating improved LE strength in order to safely ambulate with decreased risk for fals.    Baseline  0.688 m/s, 10/15/17: 1.0 m/s, , . 62 m/sec    Time  6    Period  Weeks    Status  Achieved    Target Date  12/08/17      PT SHORT TERM GOAL #3   Title  Pt will improve TUG to at least 16 seconds in order to demonstrate improve LE strength and dynamic balance.    Baseline  19 sec; 10/15/17: 11.23s,     Time  6    Period  Weeks    Status  Achieved      PT SHORT TERM GOAL #4   Title  Pt will improve Berg Balance score to 46/56 in order to demonstarte improved dynamic and static balance in order to decrease overall falls risk.    Baseline  41/56; 51/56    Time  6    Period  Weeks    Status  Achieved        PT Long Term Goals - 11/12/17 1020      PT LONG TERM GOAL #1   Title  Pt will demonstrate independence with HEP in order to continue LE strengthening and balance interventions at home to  manage symptoms and decrease overall falls risk.    Time  12    Period  Weeks    Status  On-going    Target Date  12/08/17      PT LONG TERM GOAL #2   Title  Pt will improve 5x sit to stand to <15 seconds in order to demonstrate improved LE strength and improved balance in order to decrease falls risk.    Baseline  18 sec; 10/15/17: 21.41s, 22.48    Time  12    Period  Weeks    Status  On-going    Target Date  12/08/17      PT LONG TERM GOAL #3   Title  Pt will improve gait speed to at least 1.0 m/s in order to demonstrate improved LE strength and balance in order to ambulate with decreased risk for falls.    Baseline  0.688 m/s; 10/15/17: 1.0 m/s    Time  12    Period  Weeks    Status  Achieved      PT LONG TERM GOAL #4   Title  Pt will demonstrate an improved TUG score to < or equal to 14 seconds in order to demonstrate improved LE strength and balance in order to decrease overall falls risk.    Baseline  19 sec; 10/15/17: 11.23s    Time  12    Period  Weeks    Status  Achieved      PT LONG TERM GOAL #5   Title  Pt will demonstrate an improved Berg Balance score to at least 51/56 in order to demonstrate improved dynamic and static balance activities.    Baseline  41/56; 10/15/17: 51/56    Time  12    Period  Weeks    Status  Achieved      PT LONG TERM GOAL #6   Title  Pt will improve LE strength to 4+/5 in all limited planes in order to increase funtional abilities and improve gait.    Baseline  Gross strength assessment: 3+/5, L hip ext 2+/5, R hip ext 3-/5; 10/15/17: hip extension 3/5 bilaterally, hip abduction/adduction: 3+/5 on R side, 4-/5 on L side    Time  12    Period  Weeks    Status  On-going    Target Date  12/08/17      PT LONG TERM GOAL #7   Title  Pt will improve gait speed to at least 1.2 m/s in order to demonstrate improved LE strength and balance in order to ambulate with decreased risk for falls.    Baseline  0.688 m/s; 10/15/17: 1.0 m/s, .62 /sec     Time  8    Period  Weeks    Status  New      PT LONG TERM GOAL #8   Title  Pt will demonstrate an improved Berg Balance score to at least 54/56 in order to demonstrate improved dynamic and static balance activities.    Baseline  41/56; 10/15/17: 51/56    Time  8    Period  Weeks    Status  On-going    Target Date  12/08/17            Plan - 11/24/17 1147    Clinical Impression Statement  Patient more confused today, however pleasant and cheerful. Starting to try to dance midsession to song humming. Two step patterning of two steps sideways and two steps other side implemented onto unstable surfaces with patient demonstrating good ability to retain COM, however without two step pt. Demonstrated  more loss of balance incidences. Patient will continue to improve from skilled physical therapy in order to improve dynamic standing balance and increase strength in order to decrease fall risk.     Rehab Potential  Good    PT Frequency  2x / week    PT Duration  12 weeks    PT Treatment/Interventions  Aquatic Therapy;Cryotherapy;Moist Heat;Gait training;Stair training;Functional mobility training;Therapeutic activities;Therapeutic exercise;Balance training;Neuromuscular re-education;Patient/family education;Manual techniques;Passive range of motion    PT Next Visit Plan  begin LE strength and dynamic balance activities    PT Home Exercise Plan  seated marches, seated hip ABD with red theraband    Consulted and Agree with Plan of Care  Patient       Patient will benefit from skilled therapeutic intervention in order to improve the following deficits and impairments:  Abnormal gait, Decreased coordination, Decreased activity tolerance, Decreased balance, Decreased cognition, Decreased mobility, Decreased safety awareness, Decreased strength, Difficulty walking  Visit Diagnosis: Muscle weakness (generalized)  Other lack of coordination  Unsteadiness on feet     Problem List Patient  Active Problem List   Diagnosis Date Noted  . Gait disturbance, post-stroke 09/04/2017  . Wernicke's aphasia 09/04/2017  . 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 However, needs to rule out CAA, tumor or CVT (vein of labbe)  09/01/2017  . Constipation 01/06/2017  . DJD (degenerative joint disease) of knee 07/18/2016  . GIB (gastrointestinal bleeding) 05/25/2016  . GI bleed 05/24/2016  . Degenerative joint disease (DJD) of hip 05/08/2016  . 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 Hypertensive heart disease   . Hemorrhage of gastrointestinal tract 11/13/2015  . Lumbosacral facet joint syndrome 07/26/2015  . Angioedema 07/13/2015  . DM2 (diabetes mellitus, type 2) (HCC) 07/13/2015  . Status post lumbar laminectomy 06/19/2015  . Lumbar radiculopathy 06/19/2015  . DDD (degenerative disc disease), lumbar 05/11/2015  . Facet syndrome, lumbar 05/11/2015  . Lumbar post-laminectomy syndrome 05/11/2015  . Sacroiliac joint disease 05/11/2015  . 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 Neuropathy due to secondary diabetes (HCC) 05/11/2015  . Essential hypertension 01/03/2015  . Coronary atherosclerosis of native coronary artery   . PAF (paroxysmal atrial fibrillation) (HCC) 10/20/2014  . Ischemic colitis (HCC) 10/20/2014  . Diabetes mellitus type 2 with complications (HCC) 10/20/2014  . COPD (chronic obstructive pulmonary disease) (HCC) 10/20/2014  . Hyperlipidemia 10/20/2014  . GERD (gastroesophageal reflux disease) 10/20/2014  . Ingrowing nail, right great toe 04/27/2014  . Internal hemorrhoid, bleeding 07/14/2013   Precious Bard, PT, DPT   Precious Bard 11/24/2017, 12:00 PM  [place] [facility] MAIN [facility] SERVICES [address] [place], [place], [address] Collier: [phone]   Fax:  [phone]  Name: Yvette Collier MRN:  [id number] Date of Birth: [birth year]

## 2017-11-24 NOTE — Therapy (Signed)
[place] MAIN [facility] SERVICES [address] [place], [place], [address] Phone: [phone]   Fax:  [phone]  Speech Language Pathology Treatment  Patient Details  Name: Yvette Collier MRN: [id number] Date of Birth: 1941/03/04 Referring Provider: Dr. Letta Pate   Encounter Date: 11/24/2017  End of Session - 11/24/17 1205    Visit Number  11    Number of Visits  25    Date for SLP Re-Evaluation  01/19/18    SLP Start Time  87    SLP Stop Time   1100    SLP Time Calculation (min)  55 min    Activity Tolerance  Patient tolerated treatment well       Past Medical History:  Diagnosis Date  . Asthma   . Chronic lower back pain   . COPD (chronic obstructive pulmonary disease) ([place])   . Diabetes mellitus without complication ([place])   . Gastrointestinal bleed   . GERD (gastroesophageal reflux disease)   . History of colon polyps   . Hypertension   . Hypertensive heart disease   . IBS (irritable bowel syndrome)   . Ischemic colitis ([place])   . Non-obstructive Coronary Artery Disease    a. 05/2009 Cath [facility]): minimal nonobs dzs.  . Osteoarthritis   . PAF (paroxysmal atrial fibrillation) ([place])    a. 09/2014 during GI illness;  b. CHA2DS2VASc = 5 (not currently on [place] 2/2 h/o GIB/ischemic colitis); c. 09/2014 Echo: EF 60-65%, imparied relaxation, nl RV size/fxn.  . Transient cerebral ischemia due to atrial fibrillation [facility])     Past Surgical History:  Procedure Laterality Date  . ABDOMINAL HYSTERECTOMY    . APPENDECTOMY    . BACK SURGERY     x 3, last 2004.  . CHOLECYSTECTOMY    . COLONOSCOPY WITH PROPOFOL N/A 03/20/2017   Procedure: COLONOSCOPY WITH PROPOFOL;  Surgeon: Jonathon Bellows, MD;  Location[facility] ENDOSCOPY;  Service: Endoscopy;  Laterality: N/A;  . ESOPHAGOGASTRODUODENOSCOPY (EGD) WITH PROPOFOL N/A 03/20/2017   Procedure: ESOPHAGOGASTRODUODENOSCOPY (EGD) WITH PROPOFOL;  Surgeon: Jonathon Bellows, MD;  Location: [facility] ENDOSCOPY;  Service:  Endoscopy;  Laterality: N/A;  . FOOT SURGERY Right   . HEMORRHOID BANDING    . left total hip arthroplasty  2012  . STOMACH SURGERY    . TONSILLECTOMY      There were no vitals filed for this visit.  Subjective Assessment - 11/24/17 1204    Subjective  "That's a good one"    Currently in Pain?  No/denies            ADULT SLP TREATMENT - 11/24/17 0001      General Information   Behavior/Cognition  Alert;Cooperative;Pleasant mood;Requires cueing    HPI  Patient is a 76 y/o woman with left temproal lobe hemorrhage on 09/01/17.      Treatment Provided   Treatment provided  Cognitive-Linquistic      Pain Assessment   Pain Assessment  No/denies pain      Cognitive-Linquistic Treatment   Treatment focused on  Aphasia    Skilled Treatment  VERBAL EXPRESSION: Read 3 word phrases independently with 70% accuracy.  Name pictured object given written phrase completion cue with 15% accuracy, improves to 70% given written word.  Name common object given written label with 90% accuracy.  COMPREHENSION:  Complete 3 unit processing tasks with 70% accuracy.  Identify object (f=5) with 190% accuracy with no repetition of stimulus.  Match written word to object with 70% accuracy.  Assessment / Recommendations / Plan   Plan  Continue with current plan of care      Progression Toward Goals   Progression toward goals  Progressing toward goals       SLP Education - 11/24/17 1204    Education provided  Yes    Education Details  slow down and listen    Person(s) Educated  Patient    Methods  Explanation    Comprehension  Verbalized understanding         SLP Long Term Goals - 11/19/17 1138      SLP LONG TERM GOAL #1   Title  Patient will complete 2 unit processing tasks with 80% accuracy without the need of repetition of task instructions or significant delays in responding.    Status  Achieved      SLP LONG TERM GOAL #2   Title  Patient will complete 3 unit processing tasks  with 80% accuracy without the need of repetition of task instructions or significant delays in responding.    Status  Partially Met      SLP LONG TERM GOAL #3   Title  Patient will name common objects with 50% accuracy.    Status  Not Met      SLP LONG TERM GOAL #4   Title  Patient will name common objects with 80% accuracy.    Status  Not Met      SLP LONG TERM GOAL #5   Title  Patient will generate meaningful phrase to complete simple/concrete linguistic task with 80% accuracy.    Status  Not Met      Additional Long Term Goals   Additional Long Term Goals  Yes      SLP LONG TERM GOAL #6   Title  Patient will read aloud and follow written commands with 80% accuracy    Time  8    Period  Weeks    Status  New    Target Date  01/19/18      SLP LONG TERM GOAL #7   Title  Patient will name pictured object given written phrase completion stimulus with 80% accuracy.    Time  8    Period  Weeks    Status  New    Target Date  01/19/18       Plan - 11/24/17 1205    Clinical Impression Statement  The patient continues to improve in overall communicative effectiveness.  She continues to display phonemic paraphasias, neologisms, and perseverations in open-ended verbal expression tasks.  She continues to improve in reading aloud and can now read 3 word phrases.  Patient able to name objects given written label.    Speech Therapy Frequency  2x / week    Duration  Other (comment)    Treatment/Interventions  Language facilitation;Multimodal communcation approach;SLP instruction and feedback;Patient/family education    Potential to Achieve Goals  Good    Potential Considerations  Ability to learn/carryover information;Co-morbidities;Cooperation/participation level;Medical prognosis;Pain level;Previous level of function;Severity of impairments;Family/community support    SLP Home Exercise Plan  read aloud 3- and 4-letter words    Consulted and Agree with Plan of Care  Patient        Patient will benefit from skilled therapeutic intervention in order to improve the following deficits and impairments:   Aphasia    Problem List Patient Active Problem List   Diagnosis Date Noted  . Gait disturbance, post-stroke 09/04/2017  . Wernicke's aphasia 09/04/2017  . Left temporal lobe hemorrhage (  [place]) -  left temporal moderate ICH and left frontal trace SDH, concerning for traumatic ICH due to fall. However, needs to rule out CAA, tumor or CVT (vein of labbe)  09/01/2017  . Constipation 01/06/2017  . DJD (degenerative joint disease) of knee 07/18/2016  . GIB (gastrointestinal bleeding) 05/25/2016  . GI bleed 05/24/2016  . Degenerative joint disease (DJD) of hip 05/08/2016  . 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 Hypertensive heart disease   . Hemorrhage of gastrointestinal tract 11/13/2015  . Lumbosacral facet joint syndrome 07/26/2015  . Angioedema 07/13/2015  . DM2 (diabetes mellitus, type 2) ([place]) 07/13/2015  . Status post lumbar laminectomy 06/19/2015  . Lumbar radiculopathy 06/19/2015  . DDD (degenerative disc disease), lumbar 05/11/2015  . Facet syndrome, lumbar 05/11/2015  . Lumbar post-laminectomy syndrome 05/11/2015  . Sacroiliac joint disease 05/11/2015  . 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 Neuropathy due to secondary diabetes ([place]) 05/11/2015  . Essential hypertension 01/03/2015  . Coronary atherosclerosis of native coronary artery   . PAF (paroxysmal atrial fibrillation) ([place]) 10/20/2014  . Ischemic colitis ([place]) 10/20/2014  . Diabetes mellitus type 2 with complications ([place]) 01/41/0301  . COPD (chronic obstructive pulmonary disease) ([place]) 10/20/2014  . Hyperlipidemia 10/20/2014  . GERD (gastroesophageal reflux disease) 10/20/2014  . Ingrowing nail, right great toe 04/27/2014  . Internal hemorrhoid, bleeding 07/14/2013   Leroy Sea, MS/CCC- SLP  Lou Miner 11/24/2017, 12:06 PM  [place] MAIN [facility] SERVICES [address] [place], [place], [address] Phone: [phone]   Fax:  [phone]   Name: Yvette Collier MRN: [id number] Date of Birth: 12/16/1941

## 2017-11-26 ENCOUNTER — Ambulatory Visit: Payer: Medicare Other | Admitting: Speech Pathology

## 2017-11-26 ENCOUNTER — Encounter: Payer: Self-pay | Admitting: Occupational Therapy

## 2017-11-26 ENCOUNTER — Ambulatory Visit: Payer: Medicare Other | Admitting: Occupational Therapy

## 2017-11-26 ENCOUNTER — Ambulatory Visit: Payer: Medicare Other

## 2017-11-26 ENCOUNTER — Telehealth: Payer: Self-pay | Admitting: Gastroenterology

## 2017-11-26 DIAGNOSIS — R278 Other lack of coordination: Secondary | ICD-10-CM

## 2017-11-26 DIAGNOSIS — R2681 Unsteadiness on feet: Secondary | ICD-10-CM

## 2017-11-26 DIAGNOSIS — R4701 Aphasia: Secondary | ICD-10-CM | POA: Diagnosis not present

## 2017-11-26 DIAGNOSIS — M6281 Muscle weakness (generalized): Secondary | ICD-10-CM

## 2017-11-26 NOTE — Therapy (Signed)
[facility] [address] [place], [place], [address] Phone: [phone]   Fax:  [phone]  Occupational Therapy Treatment  Patient Details  Name: Yvette RakersMary L Collier MRN: [id number] Date of Birth: [birth year] Referring Provider: Dr. Wynn BankerKirsteins   Encounter Date: 11/26/2017  OT End of Session - 11/26/17 1023    Visit Number  14    Number of Visits  24    Date for OT Re-Evaluation  12/08/17    OT Start Time  1015    OT Stop Time  1100    OT Time Calculation (min)  45 min    Activity Tolerance  Patient tolerated treatment well    Behavior During Therapy  [facility] for tasks assessed/performed       Past Medical History:  Diagnosis Date  . Asthma   . Chronic lower back pain   . COPD (chronic obstructive pulmonary disease) (HCC)   . Diabetes mellitus without complication (HCC)   . Gastrointestinal bleed   . GERD (gastroesophageal reflux disease)   . History of colon polyps   . Hypertension   . Hypertensive heart disease   . IBS (irritable bowel syndrome)   . Ischemic colitis (HCC)   . Non-obstructive Coronary Artery Disease    a. 05/2009 [facility]): minimal nonobs dzs.  . Osteoarthritis   . PAF (paroxysmal atrial fibrillation) (HCC)    a. 09/2014 during GI illness;  b. CHA2DS2VASc = 5 (not currently on OAC 2/2 h/o GIB/ischemic colitis); c. 09/2014 Echo: EF 60-65%, imparied relaxation, nl RV size/fxn.  . Transient cerebral ischemia due to atrial fibrillation [facility](HCC)     Past Surgical History:  Procedure Laterality Date  . ABDOMINAL HYSTERECTOMY    . APPENDECTOMY    . BACK SURGERY     x 3, last 2004.  . CHOLECYSTECTOMY    . COLONOSCOPY WITH PROPOFOL N/A 03/20/2017   Procedure: COLONOSCOPY WITH PROPOFOL;  Surgeon: Wyline MoodKiran Anna, MD;  [facility];  Service: Endoscopy;  Laterality: N/A;  . ESOPHAGOGASTRODUODENOSCOPY (EGD) WITH PROPOFOL N/A 03/20/2017   Procedure: ESOPHAGOGASTRODUODENOSCOPY (EGD) WITH PROPOFOL;  Surgeon:  Wyline MoodKiran Anna, MD;  Location: [facility] ENDOSCOPY;  Service: Endoscopy;  Laterality: N/A;  . FOOT SURGERY Right   . HEMORRHOID BANDING    . left total hip arthroplasty  2012  . STOMACH SURGERY    . TONSILLECTOMY      There were no vitals filed for this visit.  Subjective Assessment - 11/26/17 1020    Subjective   Pt. reports she is doing well today.    Patient is accompained by:  Family member    Pertinent History Pt. is a 76 y.o. female who suffered a Left temporla Lobe Hemorrhage secondary to Hypertensive Crisis, Moderate ICH, Left Frontal Trace SDH, and Traumatic ICH secondary to a fall. Pt. PMHX includes: HTN, DM with peripheral Neuropathy, Acute Kidney Injury, Hyperlipdidemia, and history of a GI Bleed. [facility], was discharged, and now is ready for outpatient rehab services.    Patient Stated Goals  Patient reports she would like to do as much as she can for herself.    Currently in Pain?  No/denies    Pain Score  0-No pain      OT TREATMENT    Neuro muscular re-education:  Pt. worked on grasping, and manipulating 1/4" small pegs, and placing them into a pegboard. Pt. worked on untying various sized knots using bilateral hand coordination.  Therapeutic Exercise:  Pt. Worked on pinch strengthening in the left hand for lateral, and 3pt. pinch using red, and green resistive clips. Pt. worked on placing the clips at various vertical and horizontal angles. Tactile and verbal cues were required for eliciting the desired movement.                         OT Education - 11/26/17 1022    Education provided  Yes    Education Details  [facility],  pinch strength    Person(s) Educated  Patient    Methods  Explanation;Demonstration;Verbal cues    Comprehension  Verbalized understanding;Returned demonstration         OT Long Term Goals - 11/04/17 1049      OT LONG TERM GOAL #1   Title  Pt. will be independent with basic ADL care needs.     Baseline  Eval: Pt. requires assist, 11/13: Pt. requires minA UE, ModA LE    Time  12    Period  Weeks    Status  On-going    Target Date  12/08/17      OT LONG TERM GOAL #2   Title  Pt. will be independent with light meal preparation.    Baseline  Eval: Pt. is unable, 11/04/2017: Pt. is able to assist with light cold meal prep, ahowver requires assist,.    Time  12    Period  Weeks    Status  On-going    Target Date  12/08/17      OT LONG TERM GOAL #3   Title  Pt. will be independent with tub/shower transfers    Baseline  Eval: Pt. requires assist, 11/04/2017: pt. conitnues to require assist using a shower seat.    Time  12    Period  Weeks    Status  On-going    Target Date  12/08/17      OT LONG TERM GOAL #4   Title  Pt. will increase BUE strength by 2 mm grades to assist with ADLs.    Baseline  Eval: BUE strength 4/5 overall    Time  12    Period  Weeks    Status  On-going    Target Date  12/08/17      OT LONG TERM GOAL #5   Title  Pt. will improve right [facility] skills to be able to assist with buttoning, and zipping.     Baseline  Eval: impaired, 12/08/2017: Pt. continues to require work on improving [facility] skills for buttnoning, and zipping clothing.    Time  12    Period  Weeks    Status  On-going    Target Date  12/08/17      OT LONG TERM GOAL #6   Title  Pt. will improve right grip strength to be able to open containers.    Baseline  Eval: Limited. 11/04/2017: improving    Time  12    Period  Weeks    Status  On-going    Target Date  12/08/17      OT LONG TERM GOAL #7   Title  Pt. will identify potential safety hazards accurately during ADLs, and IADLs with 100% accuracy.    Baseline  Eval: limited 11/04/2017: Conitnues to be impaired    Time  12    Period  Weeks    Status  On-going    Target Date  12/08/17      OT LONG TERM GOAL #8  Title  Pt. will demonstrate cognitive compensatory techniques 100% of the time with minimal cues.    Baseline  Eval:  limite11/13/2018: conitnues to be limited.    Time  12    Period  Weeks    Status  On-going    Target Date  12/08/17            Plan - 11/26/17 1024    Clinical Impression Statement Pt. reports having stomach/bowel issues today. Pt. continues to present with limited UE strength, [facility] skills, and Cognitive functioning. Pt. continues to work on improving UE strength, and coordination skills for improved engagement in ADL, and IADL tasks. Pt. continues to require cognitive cues.   Occupational Profile and client history currently impacting functional performance       Occupational performance deficits (Please refer to evaluation for details):  ADL's;IADL's    Rehab Potential  Good    OT Frequency  2x / week    OT Duration  12 weeks    OT Treatment/Interventions  Self-care/ADL training    Clinical Decision Making  Several treatment options, min-mod task modification necessary    Consulted and Agree with Plan of Care  Patient       Patient will benefit from skilled therapeutic intervention in order to improve the following deficits and impairments:  Abnormal gait, Decreased activity tolerance, Decreased cognition, Decreased balance, Decreased strength, Impaired UE functional use, Decreased coordination  Visit Diagnosis: Muscle weakness (generalized)    Problem List Patient Active Problem List   Diagnosis Date Noted  . Gait disturbance, post-stroke 09/04/2017  . Wernicke's aphasia 09/04/2017  . 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 However, needs to rule out CAA, tumor or CVT (vein of labbe)  09/01/2017  . Constipation 01/06/2017  . DJD (degenerative joint disease) of knee 07/18/2016  . GIB (gastrointestinal bleeding) 05/25/2016  . GI bleed 05/24/2016  . Degenerative joint disease (DJD) of hip 05/08/2016  . 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 Hypertensive heart disease   .  Hemorrhage of gastrointestinal tract 11/13/2015  . Lumbosacral facet joint syndrome 07/26/2015  . Angioedema 07/13/2015  . DM2 (diabetes mellitus, type 2) (HCC) 07/13/2015  . Status post lumbar laminectomy 06/19/2015  . Lumbar radiculopathy 06/19/2015  . DDD (degenerative disc disease), lumbar 05/11/2015  . Facet syndrome, lumbar 05/11/2015  . Lumbar post-laminectomy syndrome 05/11/2015  . Sacroiliac joint disease 05/11/2015  . 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 Neuropathy due to secondary diabetes (HCC) 05/11/2015  . Essential hypertension 01/03/2015  . Coronary atherosclerosis of native coronary artery   . PAF (paroxysmal atrial fibrillation) (HCC) 10/20/2014  . Ischemic colitis (HCC) 10/20/2014  . Diabetes mellitus type 2 with complications (HCC) 10/20/2014  . COPD (chronic obstructive pulmonary disease) (HCC) 10/20/2014  . Hyperlipidemia 10/20/2014  . GERD (gastroesophageal reflux disease) 10/20/2014  . Ingrowing nail, right great toe 04/27/2014  . Internal hemorrhoid, bleeding 07/14/2013    Olegario MessierElaine Stephane Niemann, MS, OTR/L 11/26/2017, 10:40 AM  [place]IN [facility] [address] [place], [place], [address] Phone: [phone]   Fax:  [phone]  Name: Yvette RakersMary L Collier MRN: [id number] Date of Birth: 1941-06-21

## 2017-11-26 NOTE — Telephone Encounter (Signed)
Patients daughter called, devonna reed, and stated that her Probiotic needs prior auth. She wants you to call the pharmacy and do this then call her at [phone]. She is having stomach issues and needs this done ASAP.

## 2017-11-26 NOTE — Therapy (Signed)
[place] MAIN [facility] SERVICES [address] [place], [place], [address] Phone: [phone]   Fax:  [phone]  Physical Therapy Treatment  Patient Details  Name: Yvette Collier MRN: [id number] Date of Birth: 1941/01/18 Referring Provider: BURNS, HARRIETT P   Encounter Date: 11/26/2017  PT End of Session - 11/26/17 0936    Visit Number  14    Number of Visits  25    Date for PT Re-Evaluation  12/08/17    Authorization Type  g code 4/10    PT Start Time  0930    PT Stop Time  1015    PT Time Calculation (min)  45 min    Equipment Utilized During Treatment  Gait belt    Activity Tolerance  Patient tolerated treatment well;Other (comment) Easily distracted    Behavior During Therapy  [facility] for tasks assessed/performed       Past Medical History:  Diagnosis Date  . Asthma   . Chronic lower back pain   . COPD (chronic obstructive pulmonary disease) (HCC)   . Diabetes mellitus without complication (HCC)   . Gastrointestinal bleed   . GERD (gastroesophageal reflux disease)   . History of colon polyps   . Hypertension   . Hypertensive heart disease   . IBS (irritable bowel syndrome)   . Ischemic colitis (HCC)   . Non-obstructive Coronary Artery Disease    a. 05/2009 Cath [facility]): minimal nonobs dzs.  . Osteoarthritis   . PAF (paroxysmal atrial fibrillation) (HCC)    a. 09/2014 during GI illness;  b. CHA2DS2VASc = 5 (not currently on OAC 2/2 h/o GIB/ischemic colitis); c. 09/2014 Echo: EF 60-65%, imparied relaxation, nl RV size/fxn.  . Transient cerebral ischemia due to atrial fibrillation [facility])     Past Surgical History:  Procedure Laterality Date  . ABDOMINAL HYSTERECTOMY    . APPENDECTOMY    . BACK SURGERY     x 3, last 2004.  . CHOLECYSTECTOMY    . COLONOSCOPY WITH PROPOFOL N/A 03/20/2017   Procedure: COLONOSCOPY WITH PROPOFOL;  Surgeon: Wyline Mood, MD;  Location: [facility] ENDOSCOPY;  Service: Endoscopy;  Laterality: N/A;  .  ESOPHAGOGASTRODUODENOSCOPY (EGD) WITH PROPOFOL N/A 03/20/2017   Procedure: ESOPHAGOGASTRODUODENOSCOPY (EGD) WITH PROPOFOL;  Surgeon: Wyline Mood, MD;  Location: [facility] ENDOSCOPY;  Service: Endoscopy;  Laterality: N/A;  . FOOT SURGERY Right   . HEMORRHOID BANDING    . left total hip arthroplasty  2012  . STOMACH SURGERY    . TONSILLECTOMY      There were no vitals filed for this visit.  Subjective Assessment - 11/26/17 0934    Subjective  Patient family reports that she has increased IBS symptoms today. Needs to be close to bathroom and light activity.     Pertinent History  Ahilyn Nell Whiteis a 76 y.o.right handed femalewith history of hypertension,[name],diabetes mellitus, COPD. Patient lives alone independent prior to admission and works as a Education officer, environmental. One level home. Admitted 09/01/2017 with altered mental statusright side weaknessas well as aphasia. CT of the head showed a large acute left temporal lobe hematoma approximately 15.9 mL suspect hypertensive hemorrhage.Pt presents today with fluent aphasia.    Limitations  Walking    How long can you sit comfortably?  30 minutes before needing to shift positions    How long can you stand comfortably?  about 30 minutes before needing to sit and rest    How long can you walk comfortably?  states that she has no issues with walking    Patient Stated Goals  "get stronger"    Currently in Pain?  No/denies       Nustep lvl 3 for 4 minutes (no charge)    TherEx   Standing marches 20x  6' step 20x toe taps without UE support 6" step side step toe taps 20x, patient    Seated LAQ 10x 3 seconds each    Heel raises 20x   Toe raises 20x    Walking with head turns in hallway with CGA 400 ft, one LOB to left when distracted. Required max cueing for task orientation.   Airex pad: toss balls into basket 15x with slight posterior tilt but no LOB or trunk sway, missing only two balls.    Airex WUJ:WJXBpad:eyes  closed 2x30 seconds, horizontal head turns 20 turns, vertical head turns 20x. Max cueing for task orientation.    Pt requires frequent visual, verbal and tactile cues in order to complete tasks today. Performance improved when PT performed task with patient.    Pt. response to medical necessity:  Patient will continue to benefit from skilled physical therapy to improve dynamic standing balance and increase strength in order to decrease fall risk.                     PT Education - 11/26/17 0936    Education provided  Yes    Education Details  body mechanics for functional strength and balance.     Person(s) Educated  Patient    Methods  Explanation;Demonstration;Verbal cues    Comprehension  Verbalized understanding;Returned demonstration       PT Short Term Goals - 11/12/17 1014      PT SHORT TERM GOAL #1   Title  Pt will improve 5x sit to stand to 16 seconds in order to demonstrate improved LE strength and improved dynamic balance to decrease risk for falls.    Baseline  18 sec, 10/15/17: 21.41 s,  22.48 sec    Time  6    Period  Weeks    Status  On-going    Target Date  12/08/17      PT SHORT TERM GOAL #2   Title  Pt will improve gait speed to at least 0.8 m/s, demonstrating improved LE strength in order to safely ambulate with decreased risk for fals.    Baseline  0.688 m/s, 10/15/17: 1.0 m/s, , . 62 m/sec    Time  6    Period  Weeks    Status  Achieved    Target Date  12/08/17      PT SHORT TERM GOAL #3   Title  Pt will improve TUG to at least 16 seconds in order to demonstrate improve LE strength and dynamic balance.    Baseline  19 sec; 10/15/17: 11.23s,     Time  6    Period  Weeks    Status  Achieved      PT SHORT TERM GOAL #4   Title  Pt will improve Berg Balance score to 46/56 in order to demonstarte improved dynamic and static balance in order to decrease overall falls risk.    Baseline  41/56; 51/56    Time  6    Period  Weeks    Status   Achieved        PT Long Term Goals - 11/12/17 1020      PT LONG TERM GOAL #1  Title  Pt will demonstrate independence with HEP in order to continue LE strengthening and balance interventions at home to manage symptoms and decrease overall falls risk.    Time  12    Period  Weeks    Status  On-going    Target Date  12/08/17      PT LONG TERM GOAL #2   Title  Pt will improve 5x sit to stand to <15 seconds in order to demonstrate improved LE strength and improved balance in order to decrease falls risk.    Baseline  18 sec; 10/15/17: 21.41s, 22.48    Time  12    Period  Weeks    Status  On-going    Target Date  12/08/17      PT LONG TERM GOAL #3   Title  Pt will improve gait speed to at least 1.0 m/s in order to demonstrate improved LE strength and balance in order to ambulate with decreased risk for falls.    Baseline  0.688 m/s; 10/15/17: 1.0 m/s    Time  12    Period  Weeks    Status  Achieved      PT LONG TERM GOAL #4   Title  Pt will demonstrate an improved TUG score to < or equal to 14 seconds in order to demonstrate improved LE strength and balance in order to decrease overall falls risk.    Baseline  19 sec; 10/15/17: 11.23s    Time  12    Period  Weeks    Status  Achieved      PT LONG TERM GOAL #5   Title  Pt will demonstrate an improved Berg Balance score to at least 51/56 in order to demonstrate improved dynamic and static balance activities.    Baseline  41/56; 10/15/17: 51/56    Time  12    Period  Weeks    Status  Achieved      PT LONG TERM GOAL #6   Title  Pt will improve LE strength to 4+/5 in all limited planes in order to increase funtional abilities and improve gait.    Baseline  Gross strength assessment: 3+/5, L hip ext 2+/5, R hip ext 3-/5; 10/15/17: hip extension 3/5 bilaterally, hip abduction/adduction: 3+/5 on R side, 4-/5 on L side    Time  12    Period  Weeks    Status  On-going    Target Date  12/08/17      PT LONG TERM GOAL #7   Title  Pt  will improve gait speed to at least 1.2 m/s in order to demonstrate improved LE strength and balance in order to ambulate with decreased risk for falls.    Baseline  0.688 m/s; 10/15/17: 1.0 m/s, .62 /sec    Time  8    Period  Weeks    Status  New      PT LONG TERM GOAL #8   Title  Pt will demonstrate an improved Berg Balance score to at least 54/56 in order to demonstrate improved dynamic and static balance activities.    Baseline  41/56; 10/15/17: 51/56    Time  8    Period  Weeks    Status  On-going    Target Date  12/08/17            Plan - 11/26/17 0957    Clinical Impression Statement  Patient limited in session today by IBS irritation. Patient stable on unstable surfaces with dynamic  head motions with no evidence of LOB or trunk sway. Patient fatigued quickly and required more frequent seated rest breaks. Patient will continue to benefit from skilled physical therapy to improve dynamic standing balance and increase strength in order to decrease fall risk.     Rehab Potential  Good    PT Frequency  2x / week    PT Duration  12 weeks    PT Treatment/Interventions  Aquatic Therapy;Cryotherapy;Moist Heat;Gait training;Stair training;Functional mobility training;Therapeutic activities;Therapeutic exercise;Balance training;Neuromuscular re-education;Patient/family education;Manual techniques;Passive range of motion    PT Next Visit Plan  begin LE strength and dynamic balance activities    PT Home Exercise Plan  seated marches, seated hip ABD with red theraband    Consulted and Agree with Plan of Care  Patient       Patient will benefit from skilled therapeutic intervention in order to improve the following deficits and impairments:  Abnormal gait, Decreased coordination, Decreased activity tolerance, Decreased balance, Decreased cognition, Decreased mobility, Decreased safety awareness, Decreased strength, Difficulty walking  Visit Diagnosis: Muscle weakness  (generalized)  Other lack of coordination  Unsteadiness on feet     Problem List Patient Active Problem List   Diagnosis Date Noted  . Gait disturbance, post-stroke 09/04/2017  . Wernicke's aphasia 09/04/2017  . 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 However, needs to rule out CAA, tumor or CVT (vein of labbe)  09/01/2017  . Constipation 01/06/2017  . DJD (degenerative joint disease) of knee 07/18/2016  . GIB (gastrointestinal bleeding) 05/25/2016  . GI bleed 05/24/2016  . Degenerative joint disease (DJD) of hip 05/08/2016  . 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 Hypertensive heart disease   . Hemorrhage of gastrointestinal tract 11/13/2015  . Lumbosacral facet joint syndrome 07/26/2015  . Angioedema 07/13/2015  . DM2 (diabetes mellitus, type 2) (HCC) 07/13/2015  . Status post lumbar laminectomy 06/19/2015  . Lumbar radiculopathy 06/19/2015  . DDD (degenerative disc disease), lumbar 05/11/2015  . Facet syndrome, lumbar 05/11/2015  . Lumbar post-laminectomy syndrome 05/11/2015  . Sacroiliac joint disease 05/11/2015  . 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 Neuropathy due to secondary diabetes (HCC) 05/11/2015  . Essential hypertension 01/03/2015  . Coronary atherosclerosis of native coronary artery   . PAF (paroxysmal atrial fibrillation) (HCC) 10/20/2014  . Ischemic colitis (HCC) 10/20/2014  . Diabetes mellitus type 2 with complications (HCC) 10/20/2014  . COPD (chronic obstructive pulmonary disease) (HCC) 10/20/2014  . Hyperlipidemia 10/20/2014  . GERD (gastroesophageal reflux disease) 10/20/2014  . Ingrowing nail, right great toe 04/27/2014  . Internal hemorrhoid, bleeding 07/14/2013   Precious BardMarina Winn Muehl, PT, DPT   Precious BardMarina Aylah Yeary 11/26/2017, 10:14 AM  [place] [facility] [address] [place], [place], [address] Phone: [phone]   Fax:  [phone]  Name: Tomma RakersMary L Twist MRN: [id number] Date of Birth: [birth year]

## 2017-11-26 NOTE — Telephone Encounter (Signed)
Contacted pt's daughter and advised her I will contact her insurance for the prior authorization.

## 2017-11-27 ENCOUNTER — Encounter: Payer: Self-pay | Admitting: Speech Pathology

## 2017-11-27 ENCOUNTER — Other Ambulatory Visit: Payer: Self-pay

## 2017-11-27 MED ORDER — VSL#3 PO CAPS: 1.0000 | ORAL_CAPSULE | Freq: Two times a day (BID) | ORAL | 11 refills | Status: AC

## 2017-11-27 NOTE — Therapy (Signed)
[place] MAIN [facility] SERVICES [address] [place], [place], [address] Phone: [phone]   Fax:  [phone]  Speech Language Pathology Treatment  Patient Details  Name: Yvette Collier MRN: [id number] Date of Birth: 09/06/1941 Referring Provider: Dr. Letta Pate   Encounter Date: 11/26/2017  End of Session - 11/27/17 0802    Visit Number  12    Number of Visits  25    Date for SLP Re-Evaluation  01/19/18    SLP Start Time  3    SLP Stop Time   1150    SLP Time Calculation (min)  50 min    Activity Tolerance  Patient tolerated treatment well       Past Medical History:  Diagnosis Date  . Asthma   . Chronic lower back pain   . COPD (chronic obstructive pulmonary disease) ([place])   . Diabetes mellitus without complication ([place])   . Gastrointestinal bleed   . GERD (gastroesophageal reflux disease)   . History of colon polyps   . Hypertension   . Hypertensive heart disease   . IBS (irritable bowel syndrome)   . Ischemic colitis ([place])   . Non-obstructive Coronary Artery Disease    a. 05/2009 Cath [facility]): minimal nonobs dzs.  . Osteoarthritis   . PAF (paroxysmal atrial fibrillation) ([place])    a. 09/2014 during GI illness;  b. CHA2DS2VASc = 5 (not currently on [place] 2/2 h/o GIB/ischemic colitis); c. 09/2014 Echo: EF 60-65%, imparied relaxation, nl RV size/fxn.  . Transient cerebral ischemia due to atrial fibrillation [facility])     Past Surgical History:  Procedure Laterality Date  . ABDOMINAL HYSTERECTOMY    . APPENDECTOMY    . BACK SURGERY     x 3, last 2004.  . CHOLECYSTECTOMY    . COLONOSCOPY WITH PROPOFOL N/A 03/20/2017   Procedure: COLONOSCOPY WITH PROPOFOL;  Surgeon: Jonathon Bellows, MD;  Location: [facility] ENDOSCOPY;  Service: Endoscopy;  Laterality: N/A;  . ESOPHAGOGASTRODUODENOSCOPY (EGD) WITH PROPOFOL N/A 03/20/2017   Procedure: ESOPHAGOGASTRODUODENOSCOPY (EGD) WITH PROPOFOL;  Surgeon: Jonathon Bellows, MD;  Location: [facility] ENDOSCOPY;  Service:  Endoscopy;  Laterality: N/A;  . FOOT SURGERY Right   . HEMORRHOID BANDING    . left total hip arthroplasty  2012  . STOMACH SURGERY    . TONSILLECTOMY      There were no vitals filed for this visit.  Subjective Assessment - 11/27/17 0801    Subjective  The patient indicated that she was not feeling well today    Currently in Pain?  No/denies            ADULT SLP TREATMENT - 11/27/17 0001      General Information   Behavior/Cognition  Alert;Cooperative;Pleasant mood;Requires cueing    HPI  Patient is a 76 y/o woman with left temproal lobe hemorrhage on 09/01/17.      Treatment Provided   Treatment provided  Cognitive-Linquistic      Pain Assessment   Pain Assessment  No/denies pain      Cognitive-Linquistic Treatment   Treatment focused on  Aphasia    Skilled Treatment  VERBAL EXPRESSION: Read 3 word phrases independently with 70% accuracy.  Name pictured object given written phrase completion cue with 15% accuracy, improves to 70% given written word.  Name common object given written label with 40% accuracy.  COMPREHENSION:  Complete 3 unit processing tasks with 80% accuracy given repetition and emphasis.  Identify object (f=5) with 100% accuracy with no repetition of stimulus.  Match written word to object with 90% accuracy.      Assessment / Recommendations / Plan   Plan  Continue with current plan of care      Progression Toward Goals   Progression toward goals  Progressing toward goals       SLP Education - 11/27/17 0801    Education provided  Yes    Education Details  Slow down and listen    Person(s) Educated  Patient    Methods  Explanation    Comprehension  Verbalized understanding         SLP Long Term Goals - 11/19/17 1138      SLP LONG TERM GOAL #1   Title  Patient will complete 2 unit processing tasks with 80% accuracy without the need of repetition of task instructions or significant delays in responding.    Status  Achieved      SLP LONG TERM  GOAL #2   Title  Patient will complete 3 unit processing tasks with 80% accuracy without the need of repetition of task instructions or significant delays in responding.    Status  Partially Met      SLP LONG TERM GOAL #3   Title  Patient will name common objects with 50% accuracy.    Status  Not Met      SLP LONG TERM GOAL #4   Title  Patient will name common objects with 80% accuracy.    Status  Not Met      SLP LONG TERM GOAL #5   Title  Patient will generate meaningful phrase to complete simple/concrete linguistic task with 80% accuracy.    Status  Not Met      Additional Long Term Goals   Additional Long Term Goals  Yes      SLP LONG TERM GOAL #6   Title  Patient will read aloud and follow written commands with 80% accuracy    Time  8    Period  Weeks    Status  New    Target Date  01/19/18      SLP LONG TERM GOAL #7   Title  Patient will name pictured object given written phrase completion stimulus with 80% accuracy.    Time  8    Period  Weeks    Status  New    Target Date  01/19/18       Plan - 11/27/17 0802    Clinical Impression Statement  Today the patient was more perseverative and was not able to benefit from written cues as well.  The patient continues to improve in overall communicative effectiveness.  She continues to display phonemic paraphasias, neologisms, and perseverations in open-ended verbal expression tasks.      Speech Therapy Frequency  2x / week    Duration  Other (comment)    Treatment/Interventions  Language facilitation;Multimodal communcation approach;SLP instruction and feedback;Patient/family education    Potential to Achieve Goals  Good    Potential Considerations  Ability to learn/carryover information;Co-morbidities;Cooperation/participation level;Medical prognosis;Pain level;Previous level of function;Severity of impairments;Family/community support    SLP Home Exercise Plan  read aloud 3- and 4-letter words    Consulted and Agree with  Plan of Care  Patient       Patient will benefit from skilled therapeutic intervention in order to improve the following deficits and impairments:   Aphasia    Problem List Patient Active Problem List   Diagnosis Date Noted  . Gait disturbance, post-stroke 09/04/2017  .  Wernicke's aphasia 09/04/2017  . 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 However, needs to rule out CAA, tumor or CVT (vein of labbe)  09/01/2017  . Constipation 01/06/2017  . DJD (degenerative joint disease) of knee 07/18/2016  . GIB (gastrointestinal bleeding) 05/25/2016  . GI bleed 05/24/2016  . Degenerative joint disease (DJD) of hip 05/08/2016  . 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 Hypertensive heart disease   . Hemorrhage of gastrointestinal tract 11/13/2015  . Lumbosacral facet joint syndrome 07/26/2015  . Angioedema 07/13/2015  . DM2 (diabetes mellitus, type 2) ([place]) 07/13/2015  . Status post lumbar laminectomy 06/19/2015  . Lumbar radiculopathy 06/19/2015  . DDD (degenerative disc disease), lumbar 05/11/2015  . Facet syndrome, lumbar 05/11/2015  . Lumbar post-laminectomy syndrome 05/11/2015  . Sacroiliac joint disease 05/11/2015  . 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 Neuropathy due to secondary diabetes ([place]) 05/11/2015  . Essential hypertension 01/03/2015  . Coronary atherosclerosis of native coronary artery   . PAF (paroxysmal atrial fibrillation) ([place]) 10/20/2014  . Ischemic colitis ([place]) 10/20/2014  . Diabetes mellitus type 2 with complications ([place]) 13/64/3837  . COPD (chronic obstructive pulmonary disease) ([place]) 10/20/2014  . Hyperlipidemia 10/20/2014  . GERD (gastroesophageal reflux disease) 10/20/2014  . Ingrowing nail, right great toe 04/27/2014  . Internal hemorrhoid, bleeding 07/14/2013   Leroy Sea, MS/CCC- SLP  Lou Miner 11/27/2017,  8:03 AM  [place] MAIN [facility] SERVICES [address] [place], [place], [address] Phone: [phone]   Fax:  [phone]   Name: SHAWNTEL FARNWORTH MRN: [id number] Date of Birth: January 01, 1941

## 2017-11-27 NOTE — Telephone Encounter (Signed)
Contacted pt's pharmacy and she stated pt was only needing a refill on her meds. Not a prior authorization.

## 2017-12-01 ENCOUNTER — Encounter: Payer: Medicare Other | Admitting: Occupational Therapy

## 2017-12-01 ENCOUNTER — Encounter: Payer: Medicare Other | Admitting: Speech Pathology

## 2017-12-01 ENCOUNTER — Ambulatory Visit: Payer: Medicare Other

## 2017-12-03 ENCOUNTER — Ambulatory Visit: Payer: Medicare Other | Admitting: Occupational Therapy

## 2017-12-03 ENCOUNTER — Ambulatory Visit: Payer: Medicare Other

## 2017-12-03 ENCOUNTER — Ambulatory Visit: Payer: Medicare Other | Admitting: Speech Pathology

## 2017-12-09 ENCOUNTER — Encounter: Payer: Self-pay | Admitting: Occupational Therapy

## 2017-12-09 ENCOUNTER — Ambulatory Visit: Payer: Medicare Other | Admitting: Speech Pathology

## 2017-12-09 ENCOUNTER — Ambulatory Visit: Payer: Medicare Other | Admitting: Occupational Therapy

## 2017-12-09 ENCOUNTER — Ambulatory Visit: Payer: Medicare Other

## 2017-12-09 ENCOUNTER — Other Ambulatory Visit: Payer: Self-pay

## 2017-12-09 ENCOUNTER — Encounter: Payer: Self-pay | Admitting: Speech Pathology

## 2017-12-09 DIAGNOSIS — R278 Other lack of coordination: Secondary | ICD-10-CM

## 2017-12-09 DIAGNOSIS — M6281 Muscle weakness (generalized): Secondary | ICD-10-CM

## 2017-12-09 DIAGNOSIS — R4701 Aphasia: Secondary | ICD-10-CM

## 2017-12-09 DIAGNOSIS — R2681 Unsteadiness on feet: Secondary | ICD-10-CM

## 2017-12-09 NOTE — Therapy (Signed)
[place] MAIN [facility] SERVICES [address] [place], [place], [address] Phone: [phone]   Fax:  [phone]  Speech Language Pathology Treatment  Patient Details  Name: Yvette Collier MRN: [id number] Date of Birth: 01-03-1941 Referring Provider: Dr. Letta Pate   Encounter Date: 12/09/2017  End of Session - 12/09/17 1222    Visit Number  13    Number of Visits  25    Date for SLP Re-Evaluation  01/19/18    SLP Start Time  1100    SLP Stop Time   1150    SLP Time Calculation (min)  50 min    Activity Tolerance  Patient tolerated treatment well       Past Medical History:  Diagnosis Date  . Asthma   . Chronic lower back pain   . COPD (chronic obstructive pulmonary disease) ([place])   . Diabetes mellitus without complication ([place])   . Gastrointestinal bleed   . GERD (gastroesophageal reflux disease)   . History of colon polyps   . Hypertension   . Hypertensive heart disease   . IBS (irritable bowel syndrome)   . Ischemic colitis ([place])   . Non-obstructive Coronary Artery Disease    a. 05/2009 Cath [facility]): minimal nonobs dzs.  . Osteoarthritis   . PAF (paroxysmal atrial fibrillation) ([place])    a. 09/2014 during GI illness;  b. CHA2DS2VASc = 5 (not currently on [place] 2/2 h/o GIB/ischemic colitis); c. 09/2014 Echo: EF 60-65%, imparied relaxation, nl RV size/fxn.  . Transient cerebral ischemia due to atrial fibrillation [facility])     Past Surgical History:  Procedure Laterality Date  . ABDOMINAL HYSTERECTOMY    . APPENDECTOMY    . BACK SURGERY     x 3, last 2004.  . CHOLECYSTECTOMY    . COLONOSCOPY WITH PROPOFOL N/A 03/20/2017   Procedure: COLONOSCOPY WITH PROPOFOL;  Surgeon: Jonathon Bellows, MD;  Location: [facility] ENDOSCOPY;  Service: Endoscopy;  Laterality: N/A;  . ESOPHAGOGASTRODUODENOSCOPY (EGD) WITH PROPOFOL N/A 03/20/2017   Procedure: ESOPHAGOGASTRODUODENOSCOPY (EGD) WITH PROPOFOL;  Surgeon: Jonathon Bellows, MD;  Location: [facility] ENDOSCOPY;  Service:  Endoscopy;  Laterality: N/A;  . FOOT SURGERY Right   . HEMORRHOID BANDING    . left total hip arthroplasty  2012  . STOMACH SURGERY    . TONSILLECTOMY      There were no vitals filed for this visit.  Subjective Assessment - 12/09/17 1221    Subjective  The patient states she is feeling well today.    Currently in Pain?  No/denies            ADULT SLP TREATMENT - 12/09/17 0001      General Information   Behavior/Cognition  Alert;Cooperative;Pleasant mood;Requires cueing    HPI  Patient is a 76 y/o woman with left temproal lobe hemorrhage on 09/01/17.      Treatment Provided   Treatment provided  Cognitive-Linquistic      Pain Assessment   Pain Assessment  No/denies pain      Cognitive-Linquistic Treatment   Treatment focused on  Aphasia    Skilled Treatment  VERBAL EXPRESSION: Read 3 word phrases independently with 70% accuracy.  Name pictured object given written phrase completion cue with 15% accuracy, improves to 95% given written word.  Name common object given written label with 80% accuracy.  COMPREHENSION:  Complete 3 unit processing tasks with 80% accuracy given repetition and emphasis.  Identify object (f=5) with 100% accuracy with no repetition of stimulus.  Match written  word to object with 80% accuracy.      Assessment / Recommendations / Plan   Plan  Continue with current plan of care      Progression Toward Goals   Progression toward goals  Progressing toward goals       SLP Education - 12/09/17 1221    Education provided  Yes    Education Details  Listen to others and yourself    Person(s) Educated  Patient    Methods  Explanation    Comprehension  Verbalized understanding         SLP Long Term Goals - 11/19/17 1138      SLP LONG TERM GOAL #1   Title  Patient will complete 2 unit processing tasks with 80% accuracy without the need of repetition of task instructions or significant delays in responding.    Status  Achieved      SLP LONG TERM  GOAL #2   Title  Patient will complete 3 unit processing tasks with 80% accuracy without the need of repetition of task instructions or significant delays in responding.    Status  Partially Met      SLP LONG TERM GOAL #3   Title  Patient will name common objects with 50% accuracy.    Status  Not Met      SLP LONG TERM GOAL #4   Title  Patient will name common objects with 80% accuracy.    Status  Not Met      SLP LONG TERM GOAL #5   Title  Patient will generate meaningful phrase to complete simple/concrete linguistic task with 80% accuracy.    Status  Not Met      Additional Long Term Goals   Additional Long Term Goals  Yes      SLP LONG TERM GOAL #6   Title  Patient will read aloud and follow written commands with 80% accuracy    Time  8    Period  Weeks    Status  New    Target Date  01/19/18      SLP LONG TERM GOAL #7   Title  Patient will name pictured object given written phrase completion stimulus with 80% accuracy.    Time  8    Period  Weeks    Status  New    Target Date  01/19/18       Plan - 12/09/17 1222    Clinical Impression Statement  The patient is generating more intelligible and appropriate speech as her auditory comprehension is improving.  The patient continues to improve in overall communicative effectiveness.  She continues to display phonemic paraphasias, neologisms, and perseverations in open-ended verbal expression tasks.      Speech Therapy Frequency  2x / week    Duration  Other (comment)    Treatment/Interventions  Language facilitation;Multimodal communcation approach;SLP instruction and feedback;Patient/family education    Potential to Achieve Goals  Good    Potential Considerations  Ability to learn/carryover information;Co-morbidities;Cooperation/participation level;Medical prognosis;Pain level;Previous level of function;Severity of impairments;Family/community support    SLP Home Exercise Plan  read aloud 3- and 4-letter words    Consulted  and Agree with Plan of Care  Patient       Patient will benefit from skilled therapeutic intervention in order to improve the following deficits and impairments:   Aphasia    Problem List Patient Active Problem List   Diagnosis Date Noted  . Gait disturbance, post-stroke 09/04/2017  . Wernicke's aphasia 09/04/2017  .  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 However, needs to rule out CAA, tumor or CVT (vein of labbe)  09/01/2017  . Constipation 01/06/2017  . DJD (degenerative joint disease) of knee 07/18/2016  . GIB (gastrointestinal bleeding) 05/25/2016  . GI bleed 05/24/2016  . Degenerative joint disease (DJD) of hip 05/08/2016  . 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 Hypertensive heart disease   . Hemorrhage of gastrointestinal tract 11/13/2015  . Lumbosacral facet joint syndrome 07/26/2015  . Angioedema 07/13/2015  . DM2 (diabetes mellitus, type 2) ([place]) 07/13/2015  . Status post lumbar laminectomy 06/19/2015  . Lumbar radiculopathy 06/19/2015  . DDD (degenerative disc disease), lumbar 05/11/2015  . Facet syndrome, lumbar 05/11/2015  . Lumbar post-laminectomy syndrome 05/11/2015  . Sacroiliac joint disease 05/11/2015  . 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 Neuropathy due to secondary diabetes ([place]) 05/11/2015  . Essential hypertension 01/03/2015  . Coronary atherosclerosis of native coronary artery   . PAF (paroxysmal atrial fibrillation) ([place]) 10/20/2014  . Ischemic colitis ([place]) 10/20/2014  . Diabetes mellitus type 2 with complications ([place]) 13/68/5992  . COPD (chronic obstructive pulmonary disease) ([place]) 10/20/2014  . Hyperlipidemia 10/20/2014  . GERD (gastroesophageal reflux disease) 10/20/2014  . Ingrowing nail, right great toe 04/27/2014  . Internal hemorrhoid, bleeding 07/14/2013   Leroy Sea, MS/CCC- SLP  Lou Miner 12/09/2017, 12:23 PM  [place] MAIN [facility] SERVICES [address] [place], [place], [address] Phone: [phone]   Fax:  [phone]   Name: Yvette Collier MRN: [id number] Date of Birth: [birth year]

## 2017-12-09 NOTE — Therapy (Signed)
[facility] MAIN [facility] [address] [place], [place], [address] Phone: [phone]   Fax:  [phone]  Occupational Therapy Treatment/Recertification/G-Code Note  Patient Details  Name: Yvette Collier MRN: [id number] Date of Birth: 01-Aug-1941 Referring Provider (Historical): Dr. Wynn BankerKirsteins   Encounter Date: 12/09/2017  OT End of Session - 12/09/17 0903    Visit Number  15    Number of Visits  48    Date for OT Re-Evaluation  03/03/18    OT Start Time  0900    OT Stop Time  0945    OT Time Calculation (min)  45 min    Activity Tolerance  Patient tolerated treatment well    Behavior During Therapy  [facility] for tasks assessed/performed       Past Medical History:  Diagnosis Date  . Asthma   . Chronic lower back pain   . COPD (chronic obstructive pulmonary disease) (HCC)   . Diabetes mellitus without complication (HCC)   . Gastrointestinal bleed   . GERD (gastroesophageal reflux disease)   . History of colon polyps   . Hypertension   . Hypertensive heart disease   . IBS (irritable bowel syndrome)   . Ischemic colitis (HCC)   . Non-obstructive Coronary Artery Disease    a. 05/2009 [facility]): minimal nonobs dzs.  . Osteoarthritis   . PAF (paroxysmal atrial fibrillation) (HCC)    a. 09/2014 during GI illness;  b. CHA2DS2VASc = 5 (not currently on OAC 2/2 h/o GIB/ischemic colitis); c. 09/2014 Echo: EF 60-65%, imparied relaxation, nl RV size/fxn.  . Transient cerebral ischemia due to atrial fibrillation [facility](HCC)     Past Surgical History:  Procedure Laterality Date  . ABDOMINAL HYSTERECTOMY    . APPENDECTOMY    . BACK SURGERY     x 3, last 2004.  . CHOLECYSTECTOMY    . COLONOSCOPY WITH PROPOFOL N/A 03/20/2017   Procedure: COLONOSCOPY WITH PROPOFOL;  Surgeon: Wyline MoodKiran Anna, MD;  [facility];  Service: Endoscopy;  Laterality: N/A;  . ESOPHAGOGASTRODUODENOSCOPY (EGD) WITH PROPOFOL N/A 03/20/2017   Procedure:  ESOPHAGOGASTRODUODENOSCOPY (EGD) WITH PROPOFOL;  Surgeon: Wyline MoodKiran Anna, MD;  Location: [facility] ENDOSCOPY;  Service: Endoscopy;  Laterality: N/A;  . FOOT SURGERY Right   . HEMORRHOID BANDING    . left total hip arthroplasty  2012  . STOMACH SURGERY    . TONSILLECTOMY      There were no vitals filed for this visit.  Subjective Assessment - 12/09/17 0902    Subjective   Pt. reprots she is losing weight, and her appetite has not been very good.    Patient is accompained by:  Family member    Pertinent History  Pt. is a 76 y.o. female who suffered a Left temporla Lobe Hemorrhage secondary to Hypertensive Crisis, Moderate ICH, Left Frontal Trace SDH, and Traumatic ICH secondary to a fall. Pt. PMHX includes: HTN, DM with peripheral Neuropathy, Acute Kidney Injury, Hyperlipdidemia, and history of a GI Bleed. Pt. went the inpatient [facility], was discharged, and now is ready for outpatient rehab services.    Patient Stated Goals  Patient reports she would like to do as much as she can for herself.    Currently in Pain?  No/denies         [facility] OT Assessment - 12/09/17 0001      Coordination   Right 9 Hole Peg Test  26    Left [address] Peg Test  29  Strength   Overall Strength Comments  BUE strength: SHoulder flexion, abduction: 4/5, blbow flexion, extension: 4+/5      Hand Function   Right Hand Grip (lbs)  34    Right Hand Lateral Pinch  12 lbs    Right Hand 3 Point Pinch  13 lbs    Left Hand Grip (lbs)  30    Left Hand Lateral Pinch  11 lbs    Left 3 point pinch  7 lbs      OT TREATMENT    Measurements with obtained for UE strength, grip strength, pinch strength, and coordination. ADL functioning was assessed. Goals were reviewed with pt.   Neuro muscular re-education:  Pt. worked on grasping coins from a tabletop surface, placing them into a resistive container, and pushing them through the slot while isolating his 2nd digit. A resistive mat was placed under coins to aide  in manipulating the coins and prevent sliding when picking them up. Pt. worked on sorting coins with her right hand. Sorting the pennies from the rest of the coins.                       OT Education - 12/09/17 [phone]    Education provided  Yes    Education Details  UE strength, and [facility]    Person(s) Educated  Patient    Methods  Explanation    Comprehension  Verbalized understanding         OT Long Term Goals - 12/09/17 0908      OT LONG TERM GOAL #1   Title  Pt. will be independent with basic ADL care needs.    Baseline  Eval: Pt. requires assist, 11/13: Pt. requires minA UE, ModA LE, 12/09/2018: Pt. conitnues to require assist.    Time  12    Period  Weeks    Status  On-going    Target Date  03/03/18      OT LONG TERM GOAL #2   Title  Pt. will be independent with light meal preparation.    Baseline  Eval: Pt. is unable, 11/04/2017: Pt. is able to assist with light cold meal prep, ahowver requires assist,. 12/09/2018: Pt. continues to require assist    Time  12    Period  Weeks    Status  On-going    Target Date  03/03/18      OT LONG TERM GOAL #3   Title  Pt. will be independent with tub/shower transfers    Baseline  Eval: Pt. requires assist, 11/04/2017: pt. conitnues to require assist using a shower seat.    Time  12    Period  Weeks    Status  On-going    Target Date  03/03/18      OT LONG TERM GOAL #4   Title  Pt. will increase BUE strength by 2 mm grades to assist with ADLs.    Baseline  Eval: BUE strength 4/5 overall. 12/09/2018: BUEs: shoulder flexion, and abduction 4/5, elbow flexion/extension: 4+/5    Time  12    Period  Weeks    Status  On-going    Target Date  03/03/18      OT LONG TERM GOAL #5   Title  Pt. will improve right [facility] skills to be able to assist with buttoning, and zipping.     Baseline  Eval: impaired, 11/04/2017:  Pt. continues to require work on improving [facility] skills for buttnoning, and zipping clothing. 12/08/2017:  Pt.  continues to require assist    Time  12    Period  Weeks    Status  On-going    Target Date  03/03/18      OT LONG TERM GOAL #6   Title  Pt. will improve right grip strength to be able to open containers.    Baseline  Eval: Limited. 11/04/2017: improving, 12/09/2017: Pt. continues to have difficulty    Time  12    Period  Weeks    Status  On-going    Target Date  03/03/18      OT LONG TERM GOAL #7   Title  Pt. will identify potential safety hazards accurately during ADLs, and IADLs with 100% accuracy.    Baseline  Eval: limited 11/04/2017: Conitnues to be impaired, 12/09/2017: Pt. requires frequent cues.    Time  12    Period  Weeks    Status  On-going    Target Date  03/03/18      OT LONG TERM GOAL #8   Title  Pt. will demonstrate cognitive compensatory techniques 100% of the time with minimal cues.    Baseline  Eval: limite11/13/2018: conitnues to be limited. 12/09/2018: Pt. continues to require assist.    Time  12    Period  Weeks    Target Date  03/03/18            Plan - 12/09/17 [id number]    Clinical Impression Statement  Pt. reports she tries to help with her daily self-care tasks when she is feeling well enough. Pt. reports her family still asssist her with ADL, and IADL tasks. Pt. continues to work on improving UE strength, coordination, and cognitive compensatory strategies during ADLs, and IADLs. Pt. goals were reviewed with pt.    Occupational Profile and client history currently impacting functional performance  quires cues for safety as she attempts to leave her     Occupational performance deficits (Please refer to evaluation for details):  ADL's;IADL's    Rehab Potential  Good    OT Frequency  2x / week    OT Duration  12 weeks    OT Treatment/Interventions  Self-care/ADL training    Clinical Decision Making  Limited treatment options, no task modification necessary    Consulted and Agree with Plan of Care  Patient       Patient will benefit from skilled  therapeutic intervention in order to improve the following deficits and impairments:  Abnormal gait, Decreased activity tolerance, Decreased cognition, Decreased balance, Decreased strength, Impaired UE functional use, Decreased coordination  Visit Diagnosis: Muscle weakness (generalized)  Other lack of coordination  G-Codes - 12/09/17 0930    Functional Assessment Tool Used (Outpatient only)  Clinical judgement based on pt. functional level, 9-hole peg test    Functional Limitation  Self care    Self Care Current Status ([id number])  At least 20 percent but less than 40 percent impaired, limited or restricted    Self Care Goal Status ([id number](G8988)  At least 1 percent but less than 20 percent impaired, limited or restricted       Problem List Patient Active Problem List   Diagnosis Date Noted  . Gait disturbance, post-stroke 09/04/2017  . Wernicke's aphasia 09/04/2017  . 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 However, needs to rule out CAA, tumor or CVT (vein of labbe)  09/01/2017  . Constipation 01/06/2017  .  DJD (degenerative joint disease) of knee 07/18/2016  . GIB (gastrointestinal bleeding) 05/25/2016  . GI bleed 05/24/2016  . Degenerative joint disease (DJD) of hip 05/08/2016  . 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 Hypertensive heart disease   . Hemorrhage of gastrointestinal tract 11/13/2015  . Lumbosacral facet joint syndrome 07/26/2015  . Angioedema 07/13/2015  . DM2 (diabetes mellitus, type 2) (HCC) 07/13/2015  . Status post lumbar laminectomy 06/19/2015  . Lumbar radiculopathy 06/19/2015  . DDD (degenerative disc disease), lumbar 05/11/2015  . Facet syndrome, lumbar 05/11/2015  . Lumbar post-laminectomy syndrome 05/11/2015  . Sacroiliac joint disease 05/11/2015  . 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 Neuropathy due to secondary diabetes (HCC)  05/11/2015  . Essential hypertension 01/03/2015  . Coronary atherosclerosis of native coronary artery   . PAF (paroxysmal atrial fibrillation) (HCC) 10/20/2014  . Ischemic colitis (HCC) 10/20/2014  . Diabetes mellitus type 2 with complications (HCC) 10/20/2014  . COPD (chronic obstructive pulmonary disease) (HCC) 10/20/2014  . Hyperlipidemia 10/20/2014  . GERD (gastroesophageal reflux disease) 10/20/2014  . Ingrowing nail, right great toe 04/27/2014  . Internal hemorrhoid, bleeding 07/14/2013   Olegario Messier, MS, OTR/L  Olegario Messier 12/09/2017, 9:34 AM  [facility] MAIN [facility] SERVICES [address] [place], [place], [address] Phone: [phone]   Fax:  [phone]  Name: Yvette Collier MRN: [id number] Date of Birth: [birth year]

## 2017-12-09 NOTE — Therapy (Signed)
[place] MAIN [facility] SERVICES [address] [place], [place], [address] Phone: [phone]   Fax:  [phone]  Physical Therapy Treatment  Patient Details  Name: Yvette Collier MRN: [id number] Date of Birth: 09/22/1941 Referring Provider: BURNS, HARRIETT P   Encounter Date: 12/09/2017  PT End of Session - 12/09/17 1121    Visit Number  15    Number of Visits  41    Date for PT Re-Evaluation  02/03/18    Authorization Type  g code 5/10 next will be 1/10    PT Start Time  1115    PT Stop Time  1200    PT Time Calculation (min)  45 min    Equipment Utilized During Treatment  Gait belt    Activity Tolerance  Patient tolerated treatment well;Other (comment) Easily distracted    Behavior During Therapy  [facility] for tasks assessed/performed       Past Medical History:  Diagnosis Date  . Asthma   . Chronic lower back pain   . COPD (chronic obstructive pulmonary disease) ([place])   . Diabetes mellitus without complication ([place])   . Gastrointestinal bleed   . GERD (gastroesophageal reflux disease)   . History of colon polyps   . Hypertension   . Hypertensive heart disease   . IBS (irritable bowel syndrome)   . Ischemic colitis ([place])   . Non-obstructive Coronary Artery Disease    a. 05/2009 [facility]): minimal nonobs dzs.  . Osteoarthritis   . PAF (paroxysmal atrial fibrillation) ([place])    a. 09/2014 during GI illness;  b. CHA2DS2VASc = 5 (not currently on [place] 2/2 h/o GIB/ischemic colitis); c. 09/2014 Echo: EF 60-65%, imparied relaxation, nl RV size/fxn.  . Transient cerebral ischemia due to atrial fibrillation [facility])     Past Surgical History:  Procedure Laterality Date  . ABDOMINAL HYSTERECTOMY    . APPENDECTOMY    . BACK SURGERY     x 3, last 2004.  . CHOLECYSTECTOMY    . COLONOSCOPY WITH PROPOFOL N/A 03/20/2017   Procedure: COLONOSCOPY WITH PROPOFOL;  Surgeon: Jonathon Bellows, MD;  Location: [facility] ENDOSCOPY;  Service: Endoscopy;  Laterality: N/A;   . ESOPHAGOGASTRODUODENOSCOPY (EGD) WITH PROPOFOL N/A 03/20/2017   Procedure: ESOPHAGOGASTRODUODENOSCOPY (EGD) WITH PROPOFOL;  Surgeon: Jonathon Bellows, MD;  Location: [facility] ENDOSCOPY;  Service: Endoscopy;  Laterality: N/A;  . FOOT SURGERY Right   . HEMORRHOID BANDING    . left total hip arthroplasty  2012  . STOMACH SURGERY    . TONSILLECTOMY      There were no vitals filed for this visit.  Subjective Assessment - 12/09/17 1120    Subjective  Patient having momentary clarity intermittently allowing for questions and responses before lapsing back into confused speech. Caregiver reports no falls.     Pertinent History  [name] a 76 y.o.right handed femalewith history of hypertension,[name],diabetes mellitus, COPD. Patient lives alone independent prior to admission and works as a [employer] park manager. One level home. Admitted 09/01/2017 with altered mental statusright side weaknessas well as aphasia. CT of the head showed a large acute left temporal lobe hematoma approximately 15.9 mL suspect hypertensive hemorrhage.Pt presents today with fluent aphasia.    Limitations  Walking    How long can you sit comfortably?  30 minutes before needing to shift positions    How long can you stand comfortably?  about 30 minutes before needing to sit and rest    How  long can you walk comfortably?  states that she has no issues with walking    Patient Stated Goals  "get stronger"    Currently in Pain?  No/denies       RECERT 5x STS: 18 seconds LE strength Gait speed: 15 seconds=.67 m/s  BERG 52/56 SLS L 5 seconds R 4 seconds   DGI: 13: indicating fall risk   [facility] PT Assessment - 12/09/17 1131      Standardized Balance Assessment   Standardized Balance Assessment  Dynamic Gait Index      Berg Balance Test   Sit to Stand  Able to stand without using hands and stabilize independently    Standing Unsupported  Able to stand safely 2 minutes    Sitting with  Back Unsupported but Feet Supported on Floor or Stool  Able to sit safely and securely 2 minutes    Stand to Sit  Sits safely with minimal use of hands    Transfers  Able to transfer safely, minor use of hands    Standing Unsupported with Eyes Closed  Able to stand 10 seconds safely    Standing Ubsupported with Feet Together  Able to place feet together independently and stand 1 minute safely    From Standing, Reach Forward with Outstretched Arm  Can reach forward >12 cm safely (5")    From Standing Position, Pick up Object from Floor  Able to pick up shoe safely and easily    From Standing Position, Turn to Look Behind Over each Shoulder  Looks behind from both sides and weight shifts well    Turn 360 Degrees  Able to turn 360 degrees safely in 4 seconds or less    Standing Unsupported, Alternately Place Feet on Step/Stool  Able to stand independently and safely and complete 8 steps in 20 seconds    Standing Unsupported, One Foot in Front  Able to plae foot ahead of the other independently and hold 30 seconds    Standing on One Leg  Able to lift leg independently and hold equal to or more than 3 seconds    Total Score  52      Dynamic Gait Index   Level Surface  Mild Impairment    Change in Gait Speed  Mild Impairment    Gait with Horizontal Head Turns  Moderate Impairment    Gait with Vertical Head Turns  Mild Impairment    Gait and Pivot Turn  Mild Impairment    Step Over Obstacle  Mild Impairment    Step Around Obstacles  Moderate Impairment    Steps  Moderate Impairment    Total Score  13     Nustep Lvl 4 4 minutes  ambulate 200 ft with CGA. Slight antalgic gait with decreased hip flexion in LLE.   Step over and back half foam roller 20x Walk line in // bars 10x no LOB, slight ER of LLE.  High knee marching in // bars   Patient pain increased with ambulation in LLE.  Patient requires max verbal, tactile, and visual cueing.                    PT Education -  12/09/17 1121    Education provided  Yes    Education Details  POC, strength and balance interventions     Person(s) Educated  Patient    Methods  Explanation;Demonstration;Verbal cues    Comprehension  Verbalized understanding;Returned demonstration;Need further instruction  PT Short Term Goals - 12/09/17 1151      PT SHORT TERM GOAL #1   Title  Pt will improve 5x sit to stand to 16 seconds in order to demonstrate improved LE strength and improved dynamic balance to decrease risk for falls.    Baseline  18 sec, 10/15/17: 21.41 s,  22.48 sec    Time  6    Period  Weeks    Status  On-going      PT SHORT TERM GOAL #2   Title  Pt will improve gait speed to at least 0.8 m/s, demonstrating improved LE strength in order to safely ambulate with decreased risk for fals.    Baseline  0.688 m/s, 10/15/17: 1.0 m/s, , . 62 m/sec    Time  6    Period  Weeks    Status  Achieved      PT SHORT TERM GOAL #3   Title  Pt will improve TUG to at least 16 seconds in order to demonstrate improve LE strength and dynamic balance.    Baseline  19 sec; 10/15/17: 11.23s,     Time  6    Period  Weeks    Status  Achieved      PT SHORT TERM GOAL #4   Title  Pt will improve Berg Balance score to 46/56 in order to demonstarte improved dynamic and static balance in order to decrease overall falls risk.    Baseline  41/56; 51/56    Time  6    Period  Weeks    Status  Achieved      PT SHORT TERM GOAL #5   Title  Patient will increase dynamic gait index score to >15/24 as to demonstrate reduced fall risk and improved dynamic gait balance for better safety with community/home ambulation.     Baseline  13/24    Time  2    Period  Weeks    Status  New    Target Date  12/23/17        PT Long Term Goals - 12/09/17 1152      PT LONG TERM GOAL #1   Title  Pt will demonstrate independence with HEP in order to continue LE strengthening and balance interventions at home to manage symptoms and decrease  overall falls risk.    Baseline  HEP compliant    Time  12    Period  Weeks    Status  On-going      PT LONG TERM GOAL #2   Title  Pt will improve 5x sit to stand to <15 seconds in order to demonstrate improved LE strength and improved balance in order to decrease falls risk.    Baseline  18 sec; 10/15/17: 21.41s, 22.48    Time  12    Period  Weeks    Status  On-going      PT LONG TERM GOAL #3   Title  Pt will improve gait speed to at least 1.0 m/s in order to demonstrate improved LE strength and balance in order to ambulate with decreased risk for falls.    Baseline  0.688 m/s; 10/15/17: 1.0 m/s     Time  12    Period  Weeks    Status  Achieved      PT LONG TERM GOAL #4   Title  Pt will demonstrate an improved TUG score to < or equal to 14 seconds in order to demonstrate improved LE strength and balance in  order to decrease overall falls risk.    Baseline  19 sec; 10/15/17: 11.23s    Time  12    Period  Weeks    Status  Achieved      PT LONG TERM GOAL #5   Title  Pt will demonstrate an improved Berg Balance score to at least 51/56 in order to demonstrate improved dynamic and static balance activities.    Baseline  41/56; 10/15/17: 51/56    Time  12    Period  Weeks    Status  Achieved      Additional Long Term Goals   Additional Long Term Goals  Yes      PT LONG TERM GOAL #6   Title  Pt will improve LE strength to 4+/5 in all limited planes in order to increase funtional abilities and improve gait.    Baseline  Gross strength assessment: 3+/5, L hip ext 2+/5, R hip ext 3-/5; 10/15/17: hip extension 3/5 bilaterally, hip abduction/adduction: 3+/5 on R side, 4-/5 on L side    Time  12    Period  Weeks    Status  On-going      PT LONG TERM GOAL #7   Title  Pt will improve gait speed to at least 1.2 m/s in order to demonstrate improved LE strength and balance in order to ambulate with decreased risk for falls.    Baseline  0.688 m/s; 10/15/17: 1.0 m/s, .62 /sec 12/18: .67  m/s    Time  8    Period  Weeks    Status  Partially Met      PT LONG TERM GOAL #8   Title  Pt will demonstrate an improved Berg Balance score to at least 54/56 in order to demonstrate improved dynamic and static balance activities.    Baseline  41/56; 10/15/17: 51/56 12/18: 52/56    Time  8    Period  Weeks    Status  On-going      PT LONG TERM GOAL  #9   TITLE  Patient will increase dynamic gait index score to >19/24 as to demonstrate reduced fall risk and improved dynamic gait balance for better safety with community/home ambulation.     Baseline  12/18: 13/24    Time  8    Period  Weeks    Status  New    Target Date  02/03/18            Plan - 12/09/17 1205    Clinical Impression Statement  Patient progressing with functional mobility and balance. 5x STS=18 seconds with use of UE's, Gait speed altered due to difficulty comprehending task .67 m/s. BERG =52/56, SLS L 5 seconds R 4 seconds. DGI =13/24 indicating fall risk. Patient ambulatory mechanics altered by LLE gait deviations and poor comprehension of tasks for DGI. Patient will continue to benefit from skilled physical therapy to improve dynamic standing balance and increase strength in order to decrease fall risk.     Rehab Potential  Good    PT Frequency  2x / week    PT Duration  8 weeks    PT Treatment/Interventions  Aquatic Therapy;Cryotherapy;Moist Heat;Gait training;Stair training;Functional mobility training;Therapeutic activities;Therapeutic exercise;Balance training;Neuromuscular re-education;Patient/family education;Manual techniques;Passive range of motion    PT Next Visit Plan  begin LE strength and dynamic balance activities    PT Home Exercise Plan  seated marches, seated hip ABD with red theraband    Consulted and Agree with Plan of Care  Patient  Patient will benefit from skilled therapeutic intervention in order to improve the following deficits and impairments:  Abnormal gait, Decreased  coordination, Decreased activity tolerance, Decreased balance, Decreased cognition, Decreased mobility, Decreased safety awareness, Decreased strength, Difficulty walking  Visit Diagnosis: Muscle weakness (generalized)  Other lack of coordination  Unsteadiness on feet   G-Codes - 01/08/18 1205    Functional Assessment Tool Used (Outpatient Only)  5x sit to stand, [employer], 50mWT, SLS, DGI  clinical judgement    Functional Limitation  Mobility: Walking and moving around    Mobility: Walking and Moving Around Current Status [phone]  At least 20 percent but less than 40 percent impaired, limited or restricted    Mobility: Walking and Moving Around Goal Status [phone]  At least 1 percent but less than 20 percent impaired, limited or restricted       Problem List Patient Active Problem List   Diagnosis Date Noted  . Gait disturbance, post-stroke 09/04/2017  . Wernicke's aphasia 09/04/2017  . 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 However, needs to rule out CAA, tumor or CVT (vein of labbe)  09/01/2017  . Constipation 01/06/2017  . DJD (degenerative joint disease) of knee 07/18/2016  . GIB (gastrointestinal bleeding) 05/25/2016  . GI bleed 05/24/2016  . Degenerative joint disease (DJD) of hip 05/08/2016  . 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 Hypertensive heart disease   . Hemorrhage of gastrointestinal tract 11/13/2015  . Lumbosacral facet joint syndrome 07/26/2015  . Angioedema 07/13/2015  . DM2 (diabetes mellitus, type 2) ([place] 07/13/2015  . Status post lumbar laminectomy 06/19/2015  . Lumbar radiculopathy 06/19/2015  . DDD (degenerative disc disease), lumbar 05/11/2015  . Facet syndrome, lumbar 05/11/2015  . Lumbar post-laminectomy syndrome 05/11/2015  . Sacroiliac joint disease 05/11/2015  . 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 Neuropathy  due to secondary diabetes ([place] 05/11/2015  . Essential hypertension 01/03/2015  . Coronary atherosclerosis of native coronary artery   . PAF (paroxysmal atrial fibrillation) ([place] 10/20/2014  . Ischemic colitis ([place] 10/20/2014  . Diabetes mellitus type 2 with complications ([place] 159/08/3111 . COPD (chronic obstructive pulmonary disease) ([place] 10/20/2014  . Hyperlipidemia 10/20/2014  . GERD (gastroesophageal reflux disease) 10/20/2014  . Ingrowing nail, right great toe 04/27/2014  . Internal hemorrhoid, bleeding 07/14/2013   MJanna Arch PT, DPT   MJanna Arch12019-01-17 12:06 PM  [place] [facility] [address] [address]Phone: [phone]  Fax:  [phone] Name: MJENNIFFER VESSELSMRN: [id number] of Birth: [birth year]

## 2017-12-11 ENCOUNTER — Ambulatory Visit: Payer: Medicare Other | Admitting: Occupational Therapy

## 2017-12-11 ENCOUNTER — Ambulatory Visit: Payer: Medicare Other

## 2017-12-11 ENCOUNTER — Ambulatory Visit: Payer: Medicare Other | Admitting: Speech Pathology

## 2017-12-17 ENCOUNTER — Ambulatory Visit: Payer: Medicare Other

## 2017-12-17 DIAGNOSIS — M6281 Muscle weakness (generalized): Secondary | ICD-10-CM

## 2017-12-17 DIAGNOSIS — R4701 Aphasia: Secondary | ICD-10-CM | POA: Diagnosis not present

## 2017-12-17 DIAGNOSIS — R278 Other lack of coordination: Secondary | ICD-10-CM

## 2017-12-17 DIAGNOSIS — R2681 Unsteadiness on feet: Secondary | ICD-10-CM

## 2017-12-17 NOTE — Therapy (Signed)
[place] MAIN [facility] SERVICES [address] [place], [place], [address] Phone: [phone]   Fax:  [phone]  Physical Therapy Treatment  Patient Details  Name: Yvette Collier MRN: [id number] Date of Birth: December 12, 1941 Referring Provider: BURNS, HARRIETT P   Encounter Date: 12/17/2017  PT End of Session - 12/17/17 1021    Visit Number  16    Number of Visits  41    Date for PT Re-Evaluation  02/03/18    Authorization Type  1/10    PT Start Time  1015    PT Stop Time  1100    PT Time Calculation (min)  45 min    Equipment Utilized During Treatment  Gait belt    Activity Tolerance  Patient tolerated treatment well;Other (comment) Easily distracted    Behavior During Therapy  [facility] for tasks assessed/performed       Past Medical History:  Diagnosis Date  . Asthma   . Chronic lower back pain   . COPD (chronic obstructive pulmonary disease) ([place])   . Diabetes mellitus without complication ([place])   . Gastrointestinal bleed   . GERD (gastroesophageal reflux disease)   . History of colon polyps   . Hypertension   . Hypertensive heart disease   . IBS (irritable bowel syndrome)   . Ischemic colitis ([place])   . Non-obstructive Coronary Artery Disease    a. 05/2009 Cath [facility]): minimal nonobs dzs.  . Osteoarthritis   . PAF (paroxysmal atrial fibrillation) ([place])    a. 09/2014 during GI illness;  b. CHA2DS2VASc = 5 (not currently on [place] 2/2 h/o GIB/ischemic colitis); c. 09/2014 Echo: EF 60-65%, imparied relaxation, nl RV size/fxn.  . Transient cerebral ischemia due to atrial fibrillation [facility])     Past Surgical History:  Procedure Laterality Date  . ABDOMINAL HYSTERECTOMY    . APPENDECTOMY    . BACK SURGERY     x 3, last 2004.  . CHOLECYSTECTOMY    . COLONOSCOPY WITH PROPOFOL N/A 03/20/2017   Procedure: COLONOSCOPY WITH PROPOFOL;  Surgeon: Jonathon Bellows, MD;  Location: [facility] ENDOSCOPY;  Service: Endoscopy;  Laterality: N/A;  .  ESOPHAGOGASTRODUODENOSCOPY (EGD) WITH PROPOFOL N/A 03/20/2017   Procedure: ESOPHAGOGASTRODUODENOSCOPY (EGD) WITH PROPOFOL;  Surgeon: Jonathon Bellows, MD;  Location: [facility] ENDOSCOPY;  Service: Endoscopy;  Laterality: N/A;  . FOOT SURGERY Right   . HEMORRHOID BANDING    . left total hip arthroplasty  2012  . STOMACH SURGERY    . TONSILLECTOMY      There were no vitals filed for this visit.  Subjective Assessment - 12/17/17 1019    Subjective  Patient reports one stumble this morning when going to car. Missed last session due to exacerbation of IBS, reports she is fearful of eating now.     Pertinent History  Yvette Collier a 76 y.o.right handed femalewith history of hypertension,[name],diabetes mellitus, COPD. Patient lives alone independent prior to admission and works as a Theme park manager. One level home. Admitted 09/01/2017 with altered mental statusright side weaknessas well as aphasia. CT of the head showed a large acute left temporal lobe hematoma approximately 15.9 mL suspect hypertensive hemorrhage.Pt presents today with fluent aphasia.    Limitations  Walking    How long can you sit comfortably?  30 minutes before needing to shift positions    How long can you stand comfortably?  about 30 minutes before needing to sit and rest    How long  can you walk comfortably?  states that she has no issues with walking    Patient Stated Goals  "get stronger"    Currently in Pain?  No/denies         Nustep Lvl 3 4 minutes (no charge)     TherEx   Ankle weights: #4                  Seated LAQ 2x 10x each leg                Marching 2x 10x each leg   Heel raises 20x   Toe raises 20x    Neuro Re-ed Ambulating in hallway cues for head positioning and lifting legs, PT required to demonstrate high knees throughout ambulation session. Ambulate 500 ft.    Airex OMV:[facility] closed 2x30 seconds noted Left trunk lean, horizontal head turns 20 turns, vertical  head turns 20x. Max cueing for task orientation.   Airex pad: marching no UE support 20x  Step over and back over orange hurdle no UE support 12x each leg.   Step side step over and back over orange hurdle no UE support 12 each each leg.   Airex balance beam: tandem stance walking 4x, occasional LOB, side stepping 8x with patient repeating tune to aide in balance.    Standing high knee marching in // bars 8x in /bars    Pt requires frequent visual, verbal and tactile cues in order to complete tasks today. Performance improved when PT performed task with patient.    Pt. response to medical necessity:  Patient will continue to benefit from skilled physical therapy to improve dynamic standing balance and increase strength in order to decrease fall risk.                      PT Short Term Goals - 12/09/17 1151      PT SHORT TERM GOAL #1   Title  Pt will improve 5x sit to stand to 16 seconds in order to demonstrate improved LE strength and improved dynamic balance to decrease risk for falls.    Baseline  18 sec, 10/15/17: 21.41 s,  22.48 sec    Time  6    Period  Weeks    Status  On-going      PT SHORT TERM GOAL #2   Title  Pt will improve gait speed to at least 0.8 m/s, demonstrating improved LE strength in order to safely ambulate with decreased risk for fals.    Baseline  0.688 m/s, 10/15/17: 1.0 m/s, , . 62 m/sec    Time  6    Period  Weeks    Status  Achieved      PT SHORT TERM GOAL #3   Title  Pt will improve TUG to at least 16 seconds in order to demonstrate improve LE strength and dynamic balance.    Baseline  19 sec; 10/15/17: 11.23s,     Time  6    Period  Weeks    Status  Achieved      PT SHORT TERM GOAL #4   Title  Pt will improve Berg Balance score to 46/56 in order to demonstarte improved dynamic and static balance in order to decrease overall falls risk.    Baseline  41/56; 51/56    Time  6    Period  Weeks    Status  Achieved      PT  SHORT TERM GOAL #5  Title  Patient will increase dynamic gait index score to >15/24 as to demonstrate reduced fall risk and improved dynamic gait balance for better safety with community/home ambulation.     Baseline  13/24    Time  2    Period  Weeks    Status  New    Target Date  12/23/17        PT Long Term Goals - 12/09/17 1152      PT LONG TERM GOAL #1   Title  Pt will demonstrate independence with HEP in order to continue LE strengthening and balance interventions at home to manage symptoms and decrease overall falls risk.    Baseline  HEP compliant    Time  12    Period  Weeks    Status  On-going      PT LONG TERM GOAL #2   Title  Pt will improve 5x sit to stand to <15 seconds in order to demonstrate improved LE strength and improved balance in order to decrease falls risk.    Baseline  18 sec; 10/15/17: 21.41s, 22.48    Time  12    Period  Weeks    Status  On-going      PT LONG TERM GOAL #3   Title  Pt will improve gait speed to at least 1.0 m/s in order to demonstrate improved LE strength and balance in order to ambulate with decreased risk for falls.    Baseline  0.688 m/s; 10/15/17: 1.0 m/s     Time  12    Period  Weeks    Status  Achieved      PT LONG TERM GOAL #4   Title  Pt will demonstrate an improved TUG score to < or equal to 14 seconds in order to demonstrate improved LE strength and balance in order to decrease overall falls risk.    Baseline  19 sec; 10/15/17: 11.23s    Time  12    Period  Weeks    Status  Achieved      PT LONG TERM GOAL #5   Title  Pt will demonstrate an improved Berg Balance score to at least 51/56 in order to demonstrate improved dynamic and static balance activities.    Baseline  41/56; 10/15/17: 51/56    Time  12    Period  Weeks    Status  Achieved      Additional Long Term Goals   Additional Long Term Goals  Yes      PT LONG TERM GOAL #6   Title  Pt will improve LE strength to 4+/5 in all limited planes in order to  increase funtional abilities and improve gait.    Baseline  Gross strength assessment: 3+/5, L hip ext 2+/5, R hip ext 3-/5; 10/15/17: hip extension 3/5 bilaterally, hip abduction/adduction: 3+/5 on R side, 4-/5 on L side    Time  12    Period  Weeks    Status  On-going      PT LONG TERM GOAL #7   Title  Pt will improve gait speed to at least 1.2 m/s in order to demonstrate improved LE strength and balance in order to ambulate with decreased risk for falls.    Baseline  0.688 m/s; 10/15/17: 1.0 m/s, .62 /sec 12/18: .67 m/s    Time  8    Period  Weeks    Status  Partially Met      PT LONG TERM GOAL #8  Title  Pt will demonstrate an improved Berg Balance score to at least 54/56 in order to demonstrate improved dynamic and static balance activities.    Baseline  41/56; 10/15/17: 51/56 12/18: 52/56    Time  8    Period  Weeks    Status  On-going      PT LONG TERM GOAL  #9   TITLE  Patient will increase dynamic gait index score to >19/24 as to demonstrate reduced fall risk and improved dynamic gait balance for better safety with community/home ambulation.     Baseline  12/18: 13/24    Time  8    Period  Weeks    Status  New    Target Date  02/03/18            Plan - 12/17/17 1117    Clinical Impression Statement  Patient demonstrates decreased strength in LLE with noticeable decreased step length and stability when ambulating and performing dynamic balance interventions. Patient requires more frequent visualization and demonstration of performance of tasks in order to understand.  Patient will continue to benefit from skilled physical therapy to improve dynamic standing balance and increase strength in order to decrease fall risk.    Rehab Potential  Good    PT Frequency  2x / week    PT Duration  8 weeks    PT Treatment/Interventions  Aquatic Therapy;Cryotherapy;Moist Heat;Gait training;Stair training;Functional mobility training;Therapeutic activities;Therapeutic  exercise;Balance training;Neuromuscular re-education;Patient/family education;Manual techniques;Passive range of motion    PT Next Visit Plan  begin LE strength and dynamic balance activities    PT Home Exercise Plan  seated marches, seated hip ABD with red theraband    Consulted and Agree with Plan of Care  Patient       Patient will benefit from skilled therapeutic intervention in order to improve the following deficits and impairments:  Abnormal gait, Decreased coordination, Decreased activity tolerance, Decreased balance, Decreased cognition, Decreased mobility, Decreased safety awareness, Decreased strength, Difficulty walking  Visit Diagnosis: Muscle weakness (generalized)  Other lack of coordination  Unsteadiness on feet     Problem List Patient Active Problem List   Diagnosis Date Noted  . Gait disturbance, post-stroke 09/04/2017  . Wernicke's aphasia 09/04/2017  . 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 However, needs to rule out CAA, tumor or CVT (vein of labbe)  09/01/2017  . Constipation 01/06/2017  . DJD (degenerative joint disease) of knee 07/18/2016  . GIB (gastrointestinal bleeding) 05/25/2016  . GI bleed 05/24/2016  . Degenerative joint disease (DJD) of hip 05/08/2016  . 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 Hypertensive heart disease   . Hemorrhage of gastrointestinal tract 11/13/2015  . Lumbosacral facet joint syndrome 07/26/2015  . Angioedema 07/13/2015  . DM2 (diabetes mellitus, type 2) ([place]) 07/13/2015  . Status post lumbar laminectomy 06/19/2015  . Lumbar radiculopathy 06/19/2015  . DDD (degenerative disc disease), lumbar 05/11/2015  . Facet syndrome, lumbar 05/11/2015  . Lumbar post-laminectomy syndrome 05/11/2015  . Sacroiliac joint disease 05/11/2015  . 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 Neuropathy due to secondary  diabetes ([place]) 05/11/2015  . Essential hypertension 01/03/2015  . Coronary atherosclerosis of native coronary artery   . PAF (paroxysmal atrial fibrillation) ([place]) 10/20/2014  . Ischemic colitis ([place]) 10/20/2014  . Diabetes mellitus type 2 with complications ([place]) 54/65/0354  . COPD (chronic obstructive pulmonary disease) ([place]) 10/20/2014  . Hyperlipidemia 10/20/2014  .  GERD (gastroesophageal reflux disease) 10/20/2014  . Ingrowing nail, right great toe 04/27/2014  . Internal hemorrhoid, bleeding 07/14/2013    Janna Arch, PT, DPT   Janna Arch 12/17/2017, 11:18 AM  [place] MAIN [facility] SERVICES [address] [place], [place], [address] Phone: [phone]   Fax:  [phone]  Name: Yvette Collier MRN: [id number] Date of Birth: 1941/09/13

## 2017-12-22 ENCOUNTER — Ambulatory Visit: Payer: Medicare Other | Admitting: Speech Pathology

## 2017-12-22 ENCOUNTER — Ambulatory Visit: Payer: Medicare Other

## 2017-12-24 ENCOUNTER — Ambulatory Visit: Payer: Medicare Other | Attending: Physical Medicine & Rehabilitation | Admitting: Occupational Therapy

## 2017-12-24 ENCOUNTER — Ambulatory Visit: Payer: Medicare Other

## 2017-12-24 ENCOUNTER — Encounter: Payer: Self-pay | Admitting: Speech Pathology

## 2017-12-24 ENCOUNTER — Other Ambulatory Visit: Payer: Self-pay

## 2017-12-24 ENCOUNTER — Encounter: Payer: Self-pay | Admitting: Occupational Therapy

## 2017-12-24 ENCOUNTER — Ambulatory Visit: Payer: Medicare Other | Admitting: Speech Pathology

## 2017-12-24 DIAGNOSIS — I69315 Cognitive social or emotional deficit following cerebral infarction: Secondary | ICD-10-CM | POA: Diagnosis present

## 2017-12-24 DIAGNOSIS — R4701 Aphasia: Secondary | ICD-10-CM

## 2017-12-24 DIAGNOSIS — R278 Other lack of coordination: Secondary | ICD-10-CM | POA: Diagnosis present

## 2017-12-24 DIAGNOSIS — I611 Nontraumatic intracerebral hemorrhage in hemisphere, cortical: Secondary | ICD-10-CM | POA: Insufficient documentation

## 2017-12-24 DIAGNOSIS — R2681 Unsteadiness on feet: Secondary | ICD-10-CM | POA: Insufficient documentation

## 2017-12-24 DIAGNOSIS — M6281 Muscle weakness (generalized): Secondary | ICD-10-CM | POA: Diagnosis not present

## 2017-12-24 NOTE — Therapy (Signed)
[place] [facility] MAIN [facility] SERVICES [address] [place], [place], [address] Phone: [phone]   Fax:  [phone]  Occupational Therapy Treatment  Patient Details  Name: Yvette Collier MRN: [id number] Date of Birth: 05/12/1941 Referring Provider (Historical): Dr. Wynn Banker   Encounter Date: 12/24/2017  OT End of Session - 12/24/17 0927    Visit Number  16    Number of Visits  48    Date for OT Re-Evaluation  03/03/18    OT Start Time  0920    OT Stop Time  1000    OT Time Calculation (min)  40 min    Activity Tolerance  Patient tolerated treatment well    Behavior During Therapy  [facility] for tasks assessed/performed       Past Medical History:  Diagnosis Date  . Asthma   . Chronic lower back pain   . COPD (chronic obstructive pulmonary disease) (HCC)   . Diabetes mellitus without complication (HCC)   . Gastrointestinal bleed   . GERD (gastroesophageal reflux disease)   . History of colon polyps   . Hypertension   . Hypertensive heart disease   . IBS (irritable bowel syndrome)   . Ischemic colitis (HCC)   . Non-obstructive Coronary Artery Disease    a. 05/2009 Cath [facility]): minimal nonobs dzs.  . Osteoarthritis   . PAF (paroxysmal atrial fibrillation) (HCC)    a. 09/2014 during GI illness;  b. CHA2DS2VASc = 5 (not currently on OAC 2/2 h/o GIB/ischemic colitis); c. 09/2014 Echo: EF 60-65%, imparied relaxation, nl RV size/fxn.  . Transient cerebral ischemia due to atrial fibrillation [facility])     Past Surgical History:  Procedure Laterality Date  . ABDOMINAL HYSTERECTOMY    . APPENDECTOMY    . BACK SURGERY     x 3, last 2004.  . CHOLECYSTECTOMY    . COLONOSCOPY WITH PROPOFOL N/A 03/20/2017   Procedure: COLONOSCOPY WITH PROPOFOL;  Surgeon: Wyline Mood, MD;  Location: [facility] ENDOSCOPY;  Service: Endoscopy;  Laterality: N/A;  . ESOPHAGOGASTRODUODENOSCOPY (EGD) WITH PROPOFOL N/A 03/20/2017   Procedure: ESOPHAGOGASTRODUODENOSCOPY (EGD) WITH PROPOFOL;   Surgeon: Wyline Mood, MD;  Location: [facility] ENDOSCOPY;  Service: Endoscopy;  Laterality: N/A;  . FOOT SURGERY Right   . HEMORRHOID BANDING    . left total hip arthroplasty  2012  . STOMACH SURGERY    . TONSILLECTOMY      There were no vitals filed for this visit.  Subjective Assessment - 12/24/17 0925    Subjective   Pt. reports having had a nice New years    Patient is accompained by:  Family member    Pertinent History  Pt. is a 77 y.o. female who suffered a Left temporla Lobe Hemorrhage secondary to Hypertensive Crisis, Moderate ICH, Left Frontal Trace SDH, and Traumatic ICH secondary to a fall. Pt. PMHX includes: HTN, DM with peripheral Neuropathy, Acute Kidney Injury, Hyperlipdidemia, and history of a GI Bleed. Pt. went the inpatient rehab at [facility], was discharged, and now is ready for outpatient rehab services.    Patient Stated Goals  Patient reports she would like to do as much as she can for herself.    Currently in Pain?  Yes    Pain Score  4     Pain Location  Back    Pain Orientation  Left;Lower      OT TREATMENT    Neuro muscular re-education:  Pt. worked on [facility] skills, grasping small 1/4" pegs, and following a pattern  design. Pt. required each step of the task to be simplified, and required step-by-step cues. Pt. required verbal cues for the colors, and required peg position. Pt. was able to identify when a color was misplaced, however required cues to identify when a peg was out of position.                       OT Education - 12/24/17 [phone]    Education provided  Yes    Education Details  UE functioning    Person(s) Educated  Patient    Methods  Explanation    Comprehension  Verbalized understanding         OT Long Term Goals - 12/09/17 0908      OT LONG TERM GOAL #1   Title  Pt. will be independent with basic ADL care needs.    Baseline  Eval: Pt. requires assist, 11/13: Pt. requires minA UE, ModA LE, 12/09/2018: Pt. conitnues to  require assist.    Time  12    Period  Weeks    Status  On-going    Target Date  03/03/18      OT LONG TERM GOAL #2   Title  Pt. will be independent with light meal preparation.    Baseline  Eval: Pt. is unable, 11/04/2017: Pt. is able to assist with light cold meal prep, ahowver requires assist,. 12/09/2018: Pt. continues to require assist    Time  12    Period  Weeks    Status  On-going    Target Date  03/03/18      OT LONG TERM GOAL #3   Title  Pt. will be independent with tub/shower transfers    Baseline  Eval: Pt. requires assist, 11/04/2017: pt. conitnues to require assist using a shower seat.    Time  12    Period  Weeks    Status  On-going    Target Date  03/03/18      OT LONG TERM GOAL #4   Title  Pt. will increase BUE strength by 2 mm grades to assist with ADLs.    Baseline  Eval: BUE strength 4/5 overall. 12/09/2018: BUEs: shoulder flexion, and abduction 4/5, elbow flexion/extension: 4+/5    Time  12    Period  Weeks    Status  On-going    Target Date  03/03/18      OT LONG TERM GOAL #5   Title  Pt. will improve right [facility] skills to be able to assist with buttoning, and zipping.     Baseline  Eval: impaired, 11/04/2017:  Pt. continues to require work on improving [facility] skills for buttnoning, and zipping clothing. 12/08/2017: Pt. continues to require assist    Time  12    Period  Weeks    Status  On-going    Target Date  03/03/18      OT LONG TERM GOAL #6   Title  Pt. will improve right grip strength to be able to open containers.    Baseline  Eval: Limited. 11/04/2017: improving, 12/09/2017: Pt. continues to have difficulty    Time  12    Period  Weeks    Status  On-going    Target Date  03/03/18      OT LONG TERM GOAL #7   Title  Pt. will identify potential safety hazards accurately during ADLs, and IADLs with 100% accuracy.    Baseline  Eval: limited 11/04/2017: Conitnues to be impaired, 12/09/2017:  Pt. requires frequent cues.    Time  12    Period  Weeks     Status  On-going    Target Date  03/03/18      OT LONG TERM GOAL #8   Title  Pt. will demonstrate cognitive compensatory techniques 100% of the time with minimal cues.    Baseline  Eval: limite11/13/2018: conitnues to be limited. 12/09/2018: Pt. continues to require assist.    Time  12    Period  Weeks    Target Date  03/03/18            Plan - 12/24/17 1610    Clinical Impression Statement  Pt. reports having had pain this morning in her back, however the pain has calmed down some now. Pt. continues to work on improving UE strength, coordination skills, and cognitive functioning.  Pt. required step-by-step instruction,  required cognitive cues to follow patterns. Pt. also required the task to be simplified, which pt. was able to complete. Pt. required cues for colors, and to copy the designs.     Occupational performance deficits (Please refer to evaluation for details):  ADL's;IADL's    Rehab Potential  Good    OT Frequency  2x / week    OT Duration  12 weeks    OT Treatment/Interventions  Self-care/ADL training    Clinical Decision Making  Limited treatment options, no task modification necessary    Consulted and Agree with Plan of Care  Patient       Patient will benefit from skilled therapeutic intervention in order to improve the following deficits and impairments:  Abnormal gait, Decreased activity tolerance, Decreased cognition, Decreased balance, Decreased strength, Impaired UE functional use, Decreased coordination  Visit Diagnosis: Muscle weakness (generalized)  Other lack of coordination    Problem List Patient Active Problem List   Diagnosis Date Noted  . Gait disturbance, post-stroke 09/04/2017  . Wernicke's aphasia 09/04/2017  . 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 However, needs to rule out CAA, tumor or CVT (vein of labbe)  09/01/2017  . Constipation  01/06/2017  . DJD (degenerative joint disease) of knee 07/18/2016  . GIB (gastrointestinal bleeding) 05/25/2016  . GI bleed 05/24/2016  . Degenerative joint disease (DJD) of hip 05/08/2016  . 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 Hypertensive heart disease   . Hemorrhage of gastrointestinal tract 11/13/2015  . Lumbosacral facet joint syndrome 07/26/2015  . Angioedema 07/13/2015  . DM2 (diabetes mellitus, type 2) (HCC) 07/13/2015  . Status post lumbar laminectomy 06/19/2015  . Lumbar radiculopathy 06/19/2015  . DDD (degenerative disc disease), lumbar 05/11/2015  . Facet syndrome, lumbar 05/11/2015  . Lumbar post-laminectomy syndrome 05/11/2015  . Sacroiliac joint disease 05/11/2015  . 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 Neuropathy due to secondary diabetes (HCC) 05/11/2015  . Essential hypertension 01/03/2015  . Coronary atherosclerosis of native coronary artery   . PAF (paroxysmal atrial fibrillation) (HCC) 10/20/2014  . Ischemic colitis (HCC) 10/20/2014  . Diabetes mellitus type 2 with complications (HCC) 10/20/2014  . COPD (chronic obstructive pulmonary disease) (HCC) 10/20/2014  . Hyperlipidemia 10/20/2014  . GERD (gastroesophageal reflux disease) 10/20/2014  . Ingrowing nail, right great toe 04/27/2014  . Internal hemorrhoid, bleeding 07/14/2013    Olegario Messier, MS, OTR/L 12/24/2017, 9:44 AM  [place] [facility] MAIN [facility] SERVICES [address] [place], [place], [address] Phone: [phone]  Fax:  [phone]  Name: Yvette Collier MRN: [id number] Date of Birth: 1940/12/24

## 2017-12-24 NOTE — Therapy (Signed)
[place] MAIN [facility] SERVICES [address] [place], [place], [address] Phone: [phone]   Fax:  [phone]  Speech Language Pathology Treatment  Patient Details  Name: Yvette Collier MRN: [id number] Date of Birth: March 02, 1941 Referring Provider: Dr. Letta Pate   Encounter Date: 12/24/2017  End of Session - 12/24/17 1112    Visit Number  14    Number of Visits  25    Date for SLP Re-Evaluation  01/19/18    SLP Start Time  1000    SLP Stop Time   1054    SLP Time Calculation (min)  54 min    Activity Tolerance  Patient tolerated treatment well       Past Medical History:  Diagnosis Date  . Asthma   . Chronic lower back pain   . COPD (chronic obstructive pulmonary disease) ([place])   . Diabetes mellitus without complication ([place])   . Gastrointestinal bleed   . GERD (gastroesophageal reflux disease)   . History of colon polyps   . Hypertension   . Hypertensive heart disease   . IBS (irritable bowel syndrome)   . Ischemic colitis ([place])   . Non-obstructive Coronary Artery Disease    a. 05/2009 Cath [facility]): minimal nonobs dzs.  . Osteoarthritis   . PAF (paroxysmal atrial fibrillation) ([place])    a. 09/2014 during GI illness;  b. CHA2DS2VASc = 5 (not currently on [place] 2/2 h/o GIB/ischemic colitis); c. 09/2014 Echo: EF 60-65%, imparied relaxation, nl RV size/fxn.  . Transient cerebral ischemia due to atrial fibrillation [facility])     Past Surgical History:  Procedure Laterality Date  . ABDOMINAL HYSTERECTOMY    . APPENDECTOMY    . BACK SURGERY     x 3, last 2004.  . CHOLECYSTECTOMY    . COLONOSCOPY WITH PROPOFOL N/A 03/20/2017   Procedure: COLONOSCOPY WITH PROPOFOL;  Surgeon: Jonathon Bellows, MD;  Location: [facility] ENDOSCOPY;  Service: Endoscopy;  Laterality: N/A;  . ESOPHAGOGASTRODUODENOSCOPY (EGD) WITH PROPOFOL N/A 03/20/2017   Procedure: ESOPHAGOGASTRODUODENOSCOPY (EGD) WITH PROPOFOL;  Surgeon: Jonathon Bellows, MD;  Location: [facility] ENDOSCOPY;  Service:  Endoscopy;  Laterality: N/A;  . FOOT SURGERY Right   . HEMORRHOID BANDING    . left total hip arthroplasty  2012  . STOMACH SURGERY    . TONSILLECTOMY      There were no vitals filed for this visit.  Subjective Assessment - 12/24/17 1111    Subjective  The patient states she is feeling well today.    Currently in Pain?  No/denies            ADULT SLP TREATMENT - 12/24/17 0001      General Information   Behavior/Cognition  Alert;Cooperative;Pleasant mood;Requires cueing    HPI  Patient is a 77 y/o woman with left temproal lobe hemorrhage on 09/01/17.      Treatment Provided   Treatment provided  Cognitive-Linquistic      Pain Assessment   Pain Assessment  No/denies pain      Cognitive-Linquistic Treatment   Treatment focused on  Aphasia    Skilled Treatment  VERBAL EXPRESSION: Read 3 word phrases independently with 85% accuracy.  Name pictured object given written phrase completion cue with 20% accuracy, improves to 95% given written word.  Name common object given written label with 80% accuracy. Rapidly read aloud 3 letter words with 88% accuracy.   COMPREHENSION:  Complete 3 unit processing tasks with 75% accuracy given minimal repetition and emphasis.  Identify object (f=10)  with 100% accuracy with no repetition of stimulus.  Match written word to object with 70% accuracy.      Assessment / Recommendations / Plan   Plan  Continue with current plan of care      Progression Toward Goals   Progression toward goals  Progressing toward goals       SLP Education - 12/24/17 1111    Education provided  Yes    Education Details  Listen to yourself    Person(s) Educated  Patient    Methods  Explanation    Comprehension  Verbalized understanding         SLP Long Term Goals - 11/19/17 1138      SLP LONG TERM GOAL #1   Title  Patient will complete 2 unit processing tasks with 80% accuracy without the need of repetition of task instructions or significant delays in  responding.    Status  Achieved      SLP LONG TERM GOAL #2   Title  Patient will complete 3 unit processing tasks with 80% accuracy without the need of repetition of task instructions or significant delays in responding.    Status  Partially Met      SLP LONG TERM GOAL #3   Title  Patient will name common objects with 50% accuracy.    Status  Not Met      SLP LONG TERM GOAL #4   Title  Patient will name common objects with 80% accuracy.    Status  Not Met      SLP LONG TERM GOAL #5   Title  Patient will generate meaningful phrase to complete simple/concrete linguistic task with 80% accuracy.    Status  Not Met      Additional Long Term Goals   Additional Long Term Goals  Yes      SLP LONG TERM GOAL #6   Title  Patient will read aloud and follow written commands with 80% accuracy    Time  8    Period  Weeks    Status  New    Target Date  01/19/18      SLP LONG TERM GOAL #7   Title  Patient will name pictured object given written phrase completion stimulus with 80% accuracy.    Time  8    Period  Weeks    Status  New    Target Date  01/19/18       Plan - 12/24/17 1114    Clinical Impression Statement  The patient is generating more intelligible and appropriate speech as her auditory comprehension is improving.  The patient continues to improve in overall communicative effectiveness.  She continues to display phonemic paraphasias, neologisms, and perseverations in open-ended verbal expression tasks.      Speech Therapy Frequency  2x / week    Duration  Other (comment)    Treatment/Interventions  Language facilitation;Multimodal communcation approach;SLP instruction and feedback;Patient/family education    Potential to Achieve Goals  Good    Potential Considerations  Ability to learn/carryover information;Co-morbidities;Cooperation/participation level;Medical prognosis;Pain level;Previous level of function;Severity of impairments;Family/community support    SLP Home Exercise  Plan  read aloud 3- and 4-letter words    Consulted and Agree with Plan of Care  Patient       Patient will benefit from skilled therapeutic intervention in order to improve the following deficits and impairments:   Aphasia    Problem List Patient Active Problem List   Diagnosis Date Noted  .  Gait disturbance, post-stroke 09/04/2017  . Wernicke's aphasia 09/04/2017  . 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 However, needs to rule out CAA, tumor or CVT (vein of labbe)  09/01/2017  . Constipation 01/06/2017  . DJD (degenerative joint disease) of knee 07/18/2016  . GIB (gastrointestinal bleeding) 05/25/2016  . GI bleed 05/24/2016  . Degenerative joint disease (DJD) of hip 05/08/2016  . 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 Hypertensive heart disease   . Hemorrhage of gastrointestinal tract 11/13/2015  . Lumbosacral facet joint syndrome 07/26/2015  . Angioedema 07/13/2015  . DM2 (diabetes mellitus, type 2) ([place]) 07/13/2015  . Status post lumbar laminectomy 06/19/2015  . Lumbar radiculopathy 06/19/2015  . DDD (degenerative disc disease), lumbar 05/11/2015  . Facet syndrome, lumbar 05/11/2015  . Lumbar post-laminectomy syndrome 05/11/2015  . Sacroiliac joint disease 05/11/2015  . 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 Neuropathy due to secondary diabetes ([place]) 05/11/2015  . Essential hypertension 01/03/2015  . Coronary atherosclerosis of native coronary artery   . PAF (paroxysmal atrial fibrillation) ([place]) 10/20/2014  . Ischemic colitis ([place]) 10/20/2014  . Diabetes mellitus type 2 with complications ([place]) 82/42/9980  . COPD (chronic obstructive pulmonary disease) ([place]) 10/20/2014  . Hyperlipidemia 10/20/2014  . GERD (gastroesophageal reflux disease) 10/20/2014  . Ingrowing nail, right great toe 04/27/2014  . Internal hemorrhoid, bleeding 07/14/2013    Leroy Sea, MS/CCC- SLP  Lou Miner 12/24/2017, 11:14 AM  [place] MAIN [facility] SERVICES [address] [place], [place], [address] Phone: [phone]   Fax:  [phone]   Name: Yvette Collier MRN: [id number] Date of Birth: [birth year]

## 2017-12-24 NOTE — Therapy (Signed)
[place] MAIN [facility] SERVICES [address] [place], [place], [address] Phone: [phone]   Fax:  [phone]  Physical Therapy Treatment  Patient Details  Name: Yvette Collier MRN: [id number] Date of Birth: [birth year] Referring Provider: BURNS, HARRIETT P   Encounter Date: 12/24/2017  PT End of Session - 12/24/17 1120    Visit Number  17    Number of Visits  41    Date for PT Re-Evaluation  02/03/18    Authorization Type  2/10    PT Start Time  1114    PT Stop Time  1200    PT Time Calculation (min)  46 min    Equipment Utilized During Treatment  Gait belt    Activity Tolerance  Patient tolerated treatment well;Other (comment) Easily distracted    Behavior During Therapy  [facility] for tasks assessed/performed       Past Medical History:  Diagnosis Date  . Asthma   . Chronic lower back pain   . COPD (chronic obstructive pulmonary disease) ([place])   . Diabetes mellitus without complication ([place])   . Gastrointestinal bleed   . GERD (gastroesophageal reflux disease)   . History of colon polyps   . Hypertension   . Hypertensive heart disease   . IBS (irritable bowel syndrome)   . Ischemic colitis ([place])   . Non-obstructive Coronary Artery Disease    a. 05/2009 Cath [facility]): minimal nonobs dzs.  . Osteoarthritis   . PAF (paroxysmal atrial fibrillation) ([place])    a. 09/2014 during GI illness;  b. CHA2DS2VASc = 5 (not currently on [place] 2/2 h/o GIB/ischemic colitis); c. 09/2014 Echo: EF 60-65%, imparied relaxation, nl RV size/fxn.  . Transient cerebral ischemia due to atrial fibrillation [facility])     Past Surgical History:  Procedure Laterality Date  . ABDOMINAL HYSTERECTOMY    . APPENDECTOMY    . BACK SURGERY     x 3, last 2004.  . CHOLECYSTECTOMY    . COLONOSCOPY WITH PROPOFOL N/A 03/20/2017   Procedure: COLONOSCOPY WITH PROPOFOL;  Surgeon: Jonathon Bellows, MD;  Location: [facility] ENDOSCOPY;  Service: Endoscopy;  Laterality: N/A;  .  ESOPHAGOGASTRODUODENOSCOPY (EGD) WITH PROPOFOL N/A 03/20/2017   Procedure: ESOPHAGOGASTRODUODENOSCOPY (EGD) WITH PROPOFOL;  Surgeon: Jonathon Bellows, MD;  Location: [facility] ENDOSCOPY;  Service: Endoscopy;  Laterality: N/A;  . FOOT SURGERY Right   . HEMORRHOID BANDING    . left total hip arthroplasty  2012  . STOMACH SURGERY    . TONSILLECTOMY      There were no vitals filed for this visit.  Subjective Assessment - 12/24/17 1118    Subjective  Patient more confused than normal today, with increased jumbling of words upon answering questions.     Pertinent History  Yvette Collier a 77 y.o.right handed femalewith history of hypertension,[name],diabetes mellitus, COPD. Patient lives alone independent prior to admission and works as a Theme park manager. One level home. Admitted 09/01/2017 with altered mental statusright side weaknessas well as aphasia. CT of the head showed a large acute left temporal lobe hematoma approximately 15.9 mL suspect hypertensive hemorrhage.Pt presents today with fluent aphasia.    Limitations  Walking    How long can you sit comfortably?  30 minutes before needing to shift positions    How long can you stand comfortably?  about 30 minutes before needing to sit and rest    How long can you walk comfortably?  states that she has no issues  with walking    Patient Stated Goals  "get stronger"    Currently in Pain?  Yes    Pain Score  4     Pain Location  Back    Pain Orientation  Left;Lower    Pain Descriptors / Indicators  Aching    Pain Type  Chronic pain          Nustep Lvl 4 4 minutes (no charge)     TherEx   Ankle weights: #4         Seated LAQ 2x 15x each leg         Marching 2x 15x each leg   Heel raises 20x   Toe raises 20x    Standing abduction 12x each leg    Neuro Re-ed     Airex pad: weighted ball raises vertical 10x, horizontal d2 pattern 10x each side     Step over and back over orange hurdle no UE  support 15x each leg.    Step side step over and back over orange hurdle no UE support 12 each each leg.    Airex balance beam: tandem stance walking 4x, occasional LOB, side stepping 8x with patient repeating tune to aide in balance.    Standing high knee marching in // bars 8x in /bars     Pt requires frequent visual, verbal and tactile cues in order to complete tasks today. Performance improved when PT performed task with patient.    Pt. response to medical necessity:  Patient will continue to benefit from skilled physical therapy to improve dynamic standing balance and increase strength in order to decrease fall risk.                     PT Education - 12/24/17 1119    Education provided  Yes    Education Details  functional strength and mobility.     Person(s) Educated  Patient    Methods  Explanation;Demonstration;Verbal cues    Comprehension  Verbalized understanding;Returned demonstration;Verbal cues required       PT Short Term Goals - 12/09/17 1151      PT SHORT TERM GOAL #1   Title  Pt will improve 5x sit to stand to 16 seconds in order to demonstrate improved LE strength and improved dynamic balance to decrease risk for falls.    Baseline  18 sec, 10/15/17: 21.41 s,  22.48 sec    Time  6    Period  Weeks    Status  On-going      PT SHORT TERM GOAL #2   Title  Pt will improve gait speed to at least 0.8 m/s, demonstrating improved LE strength in order to safely ambulate with decreased risk for fals.    Baseline  0.688 m/s, 10/15/17: 1.0 m/s, , . 62 m/sec    Time  6    Period  Weeks    Status  Achieved      PT SHORT TERM GOAL #3   Title  Pt will improve TUG to at least 16 seconds in order to demonstrate improve LE strength and dynamic balance.    Baseline  19 sec; 10/15/17: 11.23s,     Time  6    Period  Weeks    Status  Achieved      PT SHORT TERM GOAL #4   Title  Pt will improve Berg Balance score to 46/56 in order to demonstarte improved  dynamic and static balance in order to decrease  overall falls risk.    Baseline  41/56; 51/56    Time  6    Period  Weeks    Status  Achieved      PT SHORT TERM GOAL #5   Title  Patient will increase dynamic gait index score to >15/24 as to demonstrate reduced fall risk and improved dynamic gait balance for better safety with community/home ambulation.     Baseline  13/24    Time  2    Period  Weeks    Status  New    Target Date  12/23/17        PT Long Term Goals - 12/09/17 1152      PT LONG TERM GOAL #1   Title  Pt will demonstrate independence with HEP in order to continue LE strengthening and balance interventions at home to manage symptoms and decrease overall falls risk.    Baseline  HEP compliant    Time  12    Period  Weeks    Status  On-going      PT LONG TERM GOAL #2   Title  Pt will improve 5x sit to stand to <15 seconds in order to demonstrate improved LE strength and improved balance in order to decrease falls risk.    Baseline  18 sec; 10/15/17: 21.41s, 22.48    Time  12    Period  Weeks    Status  On-going      PT LONG TERM GOAL #3   Title  Pt will improve gait speed to at least 1.0 m/s in order to demonstrate improved LE strength and balance in order to ambulate with decreased risk for falls.    Baseline  0.688 m/s; 10/15/17: 1.0 m/s     Time  12    Period  Weeks    Status  Achieved      PT LONG TERM GOAL #4   Title  Pt will demonstrate an improved TUG score to < or equal to 14 seconds in order to demonstrate improved LE strength and balance in order to decrease overall falls risk.    Baseline  19 sec; 10/15/17: 11.23s    Time  12    Period  Weeks    Status  Achieved      PT LONG TERM GOAL #5   Title  Pt will demonstrate an improved Berg Balance score to at least 51/56 in order to demonstrate improved dynamic and static balance activities.    Baseline  41/56; 10/15/17: 51/56    Time  12    Period  Weeks    Status  Achieved      Additional Long  Term Goals   Additional Long Term Goals  Yes      PT LONG TERM GOAL #6   Title  Pt will improve LE strength to 4+/5 in all limited planes in order to increase funtional abilities and improve gait.    Baseline  Gross strength assessment: 3+/5, L hip ext 2+/5, R hip ext 3-/5; 10/15/17: hip extension 3/5 bilaterally, hip abduction/adduction: 3+/5 on R side, 4-/5 on L side    Time  12    Period  Weeks    Status  On-going      PT LONG TERM GOAL #7   Title  Pt will improve gait speed to at least 1.2 m/s in order to demonstrate improved LE strength and balance in order to ambulate with decreased risk for falls.    Baseline  0.688  m/s; 10/15/17: 1.0 m/s, .62 /sec 12/18: .67 m/s    Time  8    Period  Weeks    Status  Partially Met      PT LONG TERM GOAL #8   Title  Pt will demonstrate an improved Berg Balance score to at least 54/56 in order to demonstrate improved dynamic and static balance activities.    Baseline  41/56; 10/15/17: 51/56 12/18: 52/56    Time  8    Period  Weeks    Status  On-going      PT LONG TERM GOAL  #9   TITLE  Patient will increase dynamic gait index score to >19/24 as to demonstrate reduced fall risk and improved dynamic gait balance for better safety with community/home ambulation.     Baseline  12/18: 13/24    Time  8    Period  Weeks    Status  New    Target Date  02/03/18            Plan - 12/24/17 1142    Clinical Impression Statement  Patient improving with functional mobility and strength. Left lower extremity continues to be weaker than RLE with functional deficits noted in decreased functional capacity for exercise. Frequent demonstration required for patient compliance.  Patient will continue to benefit from skilled physical therapy to improve dynamic standing balance and increase strength in order to decrease fall risk.    Rehab Potential  Good    PT Frequency  2x / week    PT Duration  8 weeks    PT Treatment/Interventions  Aquatic  Therapy;Cryotherapy;Moist Heat;Gait training;Stair training;Functional mobility training;Therapeutic activities;Therapeutic exercise;Balance training;Neuromuscular re-education;Patient/family education;Manual techniques;Passive range of motion    PT Next Visit Plan  begin LE strength and dynamic balance activities    PT Home Exercise Plan  seated marches, seated hip ABD with red theraband    Consulted and Agree with Plan of Care  Patient       Patient will benefit from skilled therapeutic intervention in order to improve the following deficits and impairments:  Abnormal gait, Decreased coordination, Decreased activity tolerance, Decreased balance, Decreased cognition, Decreased mobility, Decreased safety awareness, Decreased strength, Difficulty walking  Visit Diagnosis: Muscle weakness (generalized)  Other lack of coordination  Unsteadiness on feet     Problem List Patient Active Problem List   Diagnosis Date Noted  . Gait disturbance, post-stroke 09/04/2017  . Wernicke's aphasia 09/04/2017  . 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 However, needs to rule out CAA, tumor or CVT (vein of labbe)  09/01/2017  . Constipation 01/06/2017  . DJD (degenerative joint disease) of knee 07/18/2016  . GIB (gastrointestinal bleeding) 05/25/2016  . GI bleed 05/24/2016  . Degenerative joint disease (DJD) of hip 05/08/2016  . 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 Hypertensive heart disease   . Hemorrhage of gastrointestinal tract 11/13/2015  . Lumbosacral facet joint syndrome 07/26/2015  . Angioedema 07/13/2015  . DM2 (diabetes mellitus, type 2) ([place]) 07/13/2015  . Status post lumbar laminectomy 06/19/2015  . Lumbar radiculopathy 06/19/2015  . DDD (degenerative disc disease), lumbar 05/11/2015  . Facet syndrome, lumbar 05/11/2015  . Lumbar post-laminectomy syndrome 05/11/2015  . Sacroiliac joint  disease 05/11/2015  . 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 Neuropathy due to secondary diabetes ([place]) 05/11/2015  . Essential hypertension 01/03/2015  . Coronary atherosclerosis of native coronary artery   .  PAF (paroxysmal atrial fibrillation) ([place]) 10/20/2014  . Ischemic colitis ([place]) 10/20/2014  . Diabetes mellitus type 2 with complications ([place]) 32/76/1470  . COPD (chronic obstructive pulmonary disease) ([place]) 10/20/2014  . Hyperlipidemia 10/20/2014  . GERD (gastroesophageal reflux disease) 10/20/2014  . Ingrowing nail, right great toe 04/27/2014  . Internal hemorrhoid, bleeding 07/14/2013  Janna Arch, PT, DPT    Janna Arch 12/24/2017, 11:57 AM  [place] MAIN [facility] SERVICES [address] [place], [place], [address] Phone: [phone]   Fax:  [phone]  Name: Yvette Collier MRN: [id number] Date of Birth: 09-Mar-1941

## 2017-12-30 ENCOUNTER — Ambulatory Visit: Payer: Medicare Other

## 2017-12-30 ENCOUNTER — Ambulatory Visit: Payer: Medicare Other | Admitting: Speech Pathology

## 2017-12-30 ENCOUNTER — Ambulatory Visit: Payer: Medicare Other | Admitting: Occupational Therapy

## 2017-12-30 ENCOUNTER — Encounter: Payer: Self-pay | Admitting: Speech Pathology

## 2017-12-30 ENCOUNTER — Other Ambulatory Visit: Payer: Self-pay

## 2017-12-30 ENCOUNTER — Encounter: Payer: Self-pay | Admitting: Occupational Therapy

## 2017-12-30 DIAGNOSIS — R278 Other lack of coordination: Secondary | ICD-10-CM

## 2017-12-30 DIAGNOSIS — R2681 Unsteadiness on feet: Secondary | ICD-10-CM

## 2017-12-30 DIAGNOSIS — R4701 Aphasia: Secondary | ICD-10-CM

## 2017-12-30 DIAGNOSIS — M6281 Muscle weakness (generalized): Secondary | ICD-10-CM | POA: Diagnosis not present

## 2017-12-30 NOTE — Therapy (Signed)
[place] MAIN [facility] SERVICES [address] [place], [place], [address] Phone: [phone]   Fax:  [phone]  Physical Therapy Treatment  Patient Details  Name: Yvette Collier MRN: [id number] Date of Birth: [birth year] Referring Provider: BURNS, HARRIETT P   Encounter Date: 12/30/2017  PT End of Session - 12/30/17 1021    Visit Number  18    Number of Visits  41    Date for PT Re-Evaluation  02/03/18    Authorization Type  3/10    PT Start Time  1015    PT Stop Time  1100    PT Time Calculation (min)  45 min    Equipment Utilized During Treatment  Gait belt    Activity Tolerance  Patient tolerated treatment well;Other (comment) Easily distracted    Behavior During Therapy  [facility] for tasks assessed/performed       Past Medical History:  Diagnosis Date  . Asthma   . Chronic lower back pain   . COPD (chronic obstructive pulmonary disease) ([place])   . Diabetes mellitus without complication ([place])   . Gastrointestinal bleed   . GERD (gastroesophageal reflux disease)   . History of colon polyps   . Hypertension   . Hypertensive heart disease   . IBS (irritable bowel syndrome)   . Ischemic colitis ([place])   . Non-obstructive Coronary Artery Disease    a. 05/2009 Cath [facility]): minimal nonobs dzs.  . Osteoarthritis   . PAF (paroxysmal atrial fibrillation) ([place])    a. 09/2014 during GI illness;  b. CHA2DS2VASc = 5 (not currently on [place] 2/2 h/o GIB/ischemic colitis); c. 09/2014 Echo: EF 60-65%, imparied relaxation, nl RV size/fxn.  . Transient cerebral ischemia due to atrial fibrillation [facility])     Past Surgical History:  Procedure Laterality Date  . ABDOMINAL HYSTERECTOMY    . APPENDECTOMY    . BACK SURGERY     x 3, last 2004.  . CHOLECYSTECTOMY    . COLONOSCOPY WITH PROPOFOL N/A 03/20/2017   Procedure: COLONOSCOPY WITH PROPOFOL;  Surgeon: Jonathon Bellows, MD;  Location: [facility] ENDOSCOPY;  Service: Endoscopy;  Laterality: N/A;  .  ESOPHAGOGASTRODUODENOSCOPY (EGD) WITH PROPOFOL N/A 03/20/2017   Procedure: ESOPHAGOGASTRODUODENOSCOPY (EGD) WITH PROPOFOL;  Surgeon: Jonathon Bellows, MD;  Location: [facility] ENDOSCOPY;  Service: Endoscopy;  Laterality: N/A;  . FOOT SURGERY Right   . HEMORRHOID BANDING    . left total hip arthroplasty  2012  . STOMACH SURGERY    . TONSILLECTOMY      There were no vitals filed for this visit.  Subjective Assessment - 12/30/17 1019    Subjective  Patient reports no aches or pains, no stumbles since last session. Improved understanding and appropriate responses    Pertinent History  Yvette Cragin Whiteis a 77 y.o.right handed femalewith history of hypertension,[name],diabetes mellitus, COPD. Patient lives alone independent prior to admission and works as a Theme park manager. One level home. Admitted 09/01/2017 with altered mental statusright side weaknessas well as aphasia. CT of the head showed a large acute left temporal lobe hematoma approximately 15.9 mL suspect hypertensive hemorrhage.Pt presents today with fluent aphasia.    Limitations  Walking    How long can you sit comfortably?  30 minutes before needing to shift positions    How long can you stand comfortably?  about 30 minutes before needing to sit and rest    How long can you walk comfortably?  states that she has no  issues with walking    Patient Stated Goals  "get stronger"    Currently in Pain?  No/denies       Nustep Lvl 4 4 minutes (no charge)    TherEx   #4 ankle weights seated:       LAQ 2x10      Marching 2x 10   Resisted pf RTB 15x each leg  Ambulate 300 ft with cues for high knees, occasional drag of RLE.   Neuro Re-ed  Negotiating around 5 cones weaving between with CGA  Square of 4 cones with PT directing patient which color to go to  Opposite arm opposite leg UE reciprocal raises seated: 3x 10 each leg, patient required tactile cueing for support  Side stepping 6x with CGA 20 ft    Walking backwards 2x 20 ft with CGA.   Pt requires frequent visual, verbal and tactile cues in order to complete tasks today. Performance improved when PT performed task with patient.    Pt. response to medical necessity:  Patient will continue to benefit from skilled physical therapy to improve dynamic standing balance and increase strength in order to decrease fall risk                         PT Education - 12/30/17 1020    Education provided  Yes    Education Details  strength, balance, and functional mobility     Person(s) Educated  Patient    Methods  Explanation;Demonstration;Verbal cues    Comprehension  Verbalized understanding;Returned demonstration       PT Short Term Goals - 12/09/17 1151      PT SHORT TERM GOAL #1   Title  Pt will improve 5x sit to stand to 16 seconds in order to demonstrate improved LE strength and improved dynamic balance to decrease risk for falls.    Baseline  18 sec, 10/15/17: 21.41 s,  22.48 sec    Time  6    Period  Weeks    Status  On-going      PT SHORT TERM GOAL #2   Title  Pt will improve gait speed to at least 0.8 m/s, demonstrating improved LE strength in order to safely ambulate with decreased risk for fals.    Baseline  0.688 m/s, 10/15/17: 1.0 m/s, , . 62 m/sec    Time  6    Period  Weeks    Status  Achieved      PT SHORT TERM GOAL #3   Title  Pt will improve TUG to at least 16 seconds in order to demonstrate improve LE strength and dynamic balance.    Baseline  19 sec; 10/15/17: 11.23s,     Time  6    Period  Weeks    Status  Achieved      PT SHORT TERM GOAL #4   Title  Pt will improve Berg Balance score to 46/56 in order to demonstarte improved dynamic and static balance in order to decrease overall falls risk.    Baseline  41/56; 51/56    Time  6    Period  Weeks    Status  Achieved      PT SHORT TERM GOAL #5   Title  Patient will increase dynamic gait index score to >15/24 as to demonstrate  reduced fall risk and improved dynamic gait balance for better safety with community/home ambulation.     Baseline  13/24    Time  2    Period  Weeks    Status  New    Target Date  12/23/17        PT Long Term Goals - 12/09/17 1152      PT LONG TERM GOAL #1   Title  Pt will demonstrate independence with HEP in order to continue LE strengthening and balance interventions at home to manage symptoms and decrease overall falls risk.    Baseline  HEP compliant    Time  12    Period  Weeks    Status  On-going      PT LONG TERM GOAL #2   Title  Pt will improve 5x sit to stand to <15 seconds in order to demonstrate improved LE strength and improved balance in order to decrease falls risk.    Baseline  18 sec; 10/15/17: 21.41s, 22.48    Time  12    Period  Weeks    Status  On-going      PT LONG TERM GOAL #3   Title  Pt will improve gait speed to at least 1.0 m/s in order to demonstrate improved LE strength and balance in order to ambulate with decreased risk for falls.    Baseline  0.688 m/s; 10/15/17: 1.0 m/s     Time  12    Period  Weeks    Status  Achieved      PT LONG TERM GOAL #4   Title  Pt will demonstrate an improved TUG score to < or equal to 14 seconds in order to demonstrate improved LE strength and balance in order to decrease overall falls risk.    Baseline  19 sec; 10/15/17: 11.23s    Time  12    Period  Weeks    Status  Achieved      PT LONG TERM GOAL #5   Title  Pt will demonstrate an improved Berg Balance score to at least 51/56 in order to demonstrate improved dynamic and static balance activities.    Baseline  41/56; 10/15/17: 51/56    Time  12    Period  Weeks    Status  Achieved      Additional Long Term Goals   Additional Long Term Goals  Yes      PT LONG TERM GOAL #6   Title  Pt will improve LE strength to 4+/5 in all limited planes in order to increase funtional abilities and improve gait.    Baseline  Gross strength assessment: 3+/5, L hip ext 2+/5,  R hip ext 3-/5; 10/15/17: hip extension 3/5 bilaterally, hip abduction/adduction: 3+/5 on R side, 4-/5 on L side    Time  12    Period  Weeks    Status  On-going      PT LONG TERM GOAL #7   Title  Pt will improve gait speed to at least 1.2 m/s in order to demonstrate improved LE strength and balance in order to ambulate with decreased risk for falls.    Baseline  0.688 m/s; 10/15/17: 1.0 m/s, .62 /sec 12/18: .67 m/s    Time  8    Period  Weeks    Status  Partially Met      PT LONG TERM GOAL #8   Title  Pt will demonstrate an improved Berg Balance score to at least 54/56 in order to demonstrate improved dynamic and static balance activities.    Baseline  41/56; 10/15/17: 51/56 12/18: 52/56    Time  8  Period  Weeks    Status  On-going      PT LONG TERM GOAL  #9   TITLE  Patient will increase dynamic gait index score to >19/24 as to demonstrate reduced fall risk and improved dynamic gait balance for better safety with community/home ambulation.     Baseline  12/18: 13/24    Time  8    Period  Weeks    Status  New    Target Date  02/03/18            Plan - 12/30/17 1133    Clinical Impression Statement  Patient improving with functional ambulation with decreased episodes of shuffling feet. Patient challenged by negotiating obstacles/ scanning environment due to episodes of confusion with simple commands. LE strength improving. Coordination of cross body activities is challenging to patient.  Patient will continue to benefit from skilled physical therapy to improve dynamic standing balance and increase strength in order to decrease fall risk    Rehab Potential  Good    PT Frequency  2x / week    PT Duration  8 weeks    PT Treatment/Interventions  Aquatic Therapy;Cryotherapy;Moist Heat;Gait training;Stair training;Functional mobility training;Therapeutic activities;Therapeutic exercise;Balance training;Neuromuscular re-education;Patient/family education;Manual techniques;Passive  range of motion    PT Next Visit Plan  begin LE strength and dynamic balance activities    PT Home Exercise Plan  seated marches, seated hip ABD with red theraband    Consulted and Agree with Plan of Care  Patient       Patient will benefit from skilled therapeutic intervention in order to improve the following deficits and impairments:  Abnormal gait, Decreased coordination, Decreased activity tolerance, Decreased balance, Decreased cognition, Decreased mobility, Decreased safety awareness, Decreased strength, Difficulty walking  Visit Diagnosis: Muscle weakness (generalized)  Other lack of coordination  Unsteadiness on feet     Problem List Patient Active Problem List   Diagnosis Date Noted  . Gait disturbance, post-stroke 09/04/2017  . Wernicke's aphasia 09/04/2017  . 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 However, needs to rule out CAA, tumor or CVT (vein of labbe)  09/01/2017  . Constipation 01/06/2017  . DJD (degenerative joint disease) of knee 07/18/2016  . GIB (gastrointestinal bleeding) 05/25/2016  . GI bleed 05/24/2016  . Degenerative joint disease (DJD) of hip 05/08/2016  . 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 Hypertensive heart disease   . Hemorrhage of gastrointestinal tract 11/13/2015  . Lumbosacral facet joint syndrome 07/26/2015  . Angioedema 07/13/2015  . DM2 (diabetes mellitus, type 2) ([place]) 07/13/2015  . Status post lumbar laminectomy 06/19/2015  . Lumbar radiculopathy 06/19/2015  . DDD (degenerative disc disease), lumbar 05/11/2015  . Facet syndrome, lumbar 05/11/2015  . Lumbar post-laminectomy syndrome 05/11/2015  . Sacroiliac joint disease 05/11/2015  . 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 Neuropathy due to secondary diabetes ([place]) 05/11/2015  . Essential hypertension 01/03/2015  . Coronary atherosclerosis of native  coronary artery   . PAF (paroxysmal atrial fibrillation) ([place]) 10/20/2014  . Ischemic colitis ([place]) 10/20/2014  . Diabetes mellitus type 2 with complications ([place]) 20/94/7096  . COPD (chronic obstructive pulmonary disease) ([place]) 10/20/2014  . Hyperlipidemia 10/20/2014  . GERD (gastroesophageal reflux disease) 10/20/2014  . Ingrowing nail, right great toe 04/27/2014  . Internal hemorrhoid, bleeding 07/14/2013   Janna Arch, PT, DPT   Janna Arch 12/30/2017, 11:34 AM  [place]  [place], [place], [address] Phone: [phone]   Fax:  [phone]  Name: MYLYNN DINH MRN: [id number] Date of Birth: 11/05/1941

## 2017-12-30 NOTE — Therapy (Signed)
[place] MAIN [facility] SERVICES [address] [place], [place], [address] Phone: [phone]   Fax:  [phone]  Speech Language Pathology Treatment  Patient Details  Name: Yvette Collier MRN: [id number] Date of Birth: [birth year] Referring Provider: Dr. Letta Pate   Encounter Date: 12/30/2017  End of Session - 12/30/17 1047    Visit Number  15    Number of Visits  25    Date for SLP Re-Evaluation  01/19/18    SLP Start Time  89    SLP Stop Time   1000    SLP Time Calculation (min)  45 min    Activity Tolerance  Patient tolerated treatment well       Past Medical History:  Diagnosis Date  . Asthma   . Chronic lower back pain   . COPD (chronic obstructive pulmonary disease) ([place])   . Diabetes mellitus without complication ([place])   . Gastrointestinal bleed   . GERD (gastroesophageal reflux disease)   . History of colon polyps   . Hypertension   . Hypertensive heart disease   . IBS (irritable bowel syndrome)   . Ischemic colitis ([place])   . Non-obstructive Coronary Artery Disease    a. 05/2009 Cath [facility]): minimal nonobs dzs.  . Osteoarthritis   . PAF (paroxysmal atrial fibrillation) ([place])    a. 09/2014 during GI illness;  b. CHA2DS2VASc = 5 (not currently on [place] 2/2 h/o GIB/ischemic colitis); c. 09/2014 Echo: EF 60-65%, imparied relaxation, nl RV size/fxn.  . Transient cerebral ischemia due to atrial fibrillation [facility])     Past Surgical History:  Procedure Laterality Date  . ABDOMINAL HYSTERECTOMY    . APPENDECTOMY    . BACK SURGERY     x 3, last 2004.  . CHOLECYSTECTOMY    . COLONOSCOPY WITH PROPOFOL N/A 03/20/2017   Procedure: COLONOSCOPY WITH PROPOFOL;  Surgeon: Jonathon Bellows, MD;  Location: [facility] ENDOSCOPY;  Service: Endoscopy;  Laterality: N/A;  . ESOPHAGOGASTRODUODENOSCOPY (EGD) WITH PROPOFOL N/A 03/20/2017   Procedure: ESOPHAGOGASTRODUODENOSCOPY (EGD) WITH PROPOFOL;  Surgeon: Jonathon Bellows, MD;  Location: [facility] ENDOSCOPY;  Service:  Endoscopy;  Laterality: N/A;  . FOOT SURGERY Right   . HEMORRHOID BANDING    . left total hip arthroplasty  2012  . STOMACH SURGERY    . TONSILLECTOMY      There were no vitals filed for this visit.  Subjective Assessment - 12/30/17 1046    Subjective  The patient states she is feeling well today.    Currently in Pain?  No/denies            ADULT SLP TREATMENT - 12/30/17 0001      General Information   Behavior/Cognition  Alert;Cooperative;Pleasant mood;Requires cueing    HPI  Patient is a 77 y/o woman with left temproal lobe hemorrhage on 09/01/17.      Treatment Provided   Treatment provided  Cognitive-Linquistic      Cognitive-Linquistic Treatment   Treatment focused on  Aphasia    Skilled Treatment  VERBAL EXPRESSION: Read 3 word phrases independently with 85% accuracy.  Name pictured object given written phrase completion cue with 20% accuracy, improves to 95% given written word.  Name common object given written label with 80% accuracy. Rapidly read aloud 3 letter words with 88% accuracy.   COMPREHENSION:  Complete 3 unit processing tasks with 75% accuracy given minimal repetition and emphasis.  Identify object (f=10) with 100% accuracy with no repetition of stimulus.  Match written word to object  with 70% accuracy.      Assessment / Recommendations / Plan   Plan  Continue with current plan of care      Progression Toward Goals   Progression toward goals  Progressing toward goals       SLP Education - 12/30/17 1046    Education provided  Yes    Education Details  Listen to yourself for errors    Person(s) Educated  Patient    Methods  Explanation    Comprehension  Verbalized understanding         SLP Long Term Goals - 11/19/17 1138      SLP LONG TERM GOAL #1   Title  Patient will complete 2 unit processing tasks with 80% accuracy without the need of repetition of task instructions or significant delays in responding.    Status  Achieved      SLP LONG  TERM GOAL #2   Title  Patient will complete 3 unit processing tasks with 80% accuracy without the need of repetition of task instructions or significant delays in responding.    Status  Partially Met      SLP LONG TERM GOAL #3   Title  Patient will name common objects with 50% accuracy.    Status  Not Met      SLP LONG TERM GOAL #4   Title  Patient will name common objects with 80% accuracy.    Status  Not Met      SLP LONG TERM GOAL #5   Title  Patient will generate meaningful phrase to complete simple/concrete linguistic task with 80% accuracy.    Status  Not Met      Additional Long Term Goals   Additional Long Term Goals  Yes      SLP LONG TERM GOAL #6   Title  Patient will read aloud and follow written commands with 80% accuracy    Time  8    Period  Weeks    Status  New    Target Date  01/19/18      SLP LONG TERM GOAL #7   Title  Patient will name pictured object given written phrase completion stimulus with 80% accuracy.    Time  8    Period  Weeks    Status  New    Target Date  01/19/18       Plan - 12/30/17 1048    Clinical Impression Statement  The patient is generating more intelligible and appropriate speech as her auditory comprehension is improving.  The patient continues to improve in overall communicative effectiveness.  She continues to display phonemic paraphasias, neologisms, and perseverations in open-ended verbal expression tasks.      Speech Therapy Frequency  2x / week    Duration  Other (comment)    Treatment/Interventions  Language facilitation;Multimodal communcation approach;SLP instruction and feedback;Patient/family education    Potential to Achieve Goals  Good    Potential Considerations  Ability to learn/carryover information;Co-morbidities;Cooperation/participation level;Medical prognosis;Pain level;Previous level of function;Severity of impairments;Family/community support    SLP Home Exercise Plan  read aloud 3- and 4-letter words     Consulted and Agree with Plan of Care  Patient       Patient will benefit from skilled therapeutic intervention in order to improve the following deficits and impairments:   Aphasia    Problem List Patient Active Problem List   Diagnosis Date Noted  . Gait disturbance, post-stroke 09/04/2017  . Wernicke's aphasia 09/04/2017  . Left  temporal lobe hemorrhage (HCC) -  left temporal moderate ICH and left frontal trace SDH, concerning for traumatic ICH due to fall. However, needs to rule out CAA, tumor or CVT (vein of labbe)  09/01/2017  . Constipation 01/06/2017  . DJD (degenerative joint disease) of knee 07/18/2016  . GIB (gastrointestinal bleeding) 05/25/2016  . GI bleed 05/24/2016  . Degenerative joint disease (DJD) of hip 05/08/2016  . 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 Hypertensive heart disease   . Hemorrhage of gastrointestinal tract 11/13/2015  . Lumbosacral facet joint syndrome 07/26/2015  . Angioedema 07/13/2015  . DM2 (diabetes mellitus, type 2) ([place]) 07/13/2015  . Status post lumbar laminectomy 06/19/2015  . Lumbar radiculopathy 06/19/2015  . DDD (degenerative disc disease), lumbar 05/11/2015  . Facet syndrome, lumbar 05/11/2015  . Lumbar post-laminectomy syndrome 05/11/2015  . Sacroiliac joint disease 05/11/2015  . 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 Neuropathy due to secondary diabetes ([place]) 05/11/2015  . Essential hypertension 01/03/2015  . Coronary atherosclerosis of native coronary artery   . PAF (paroxysmal atrial fibrillation) ([place]) 10/20/2014  . Ischemic colitis ([place]) 10/20/2014  . Diabetes mellitus type 2 with complications ([place]) 82/86/7519  . COPD (chronic obstructive pulmonary disease) ([place]) 10/20/2014  . Hyperlipidemia 10/20/2014  . GERD (gastroesophageal reflux disease) 10/20/2014  . Ingrowing nail, right great toe 04/27/2014  . Internal hemorrhoid, bleeding 07/14/2013   Leroy Sea, MS/CCC-  SLP  Lou Miner 12/30/2017, 10:49 AM  [place] MAIN [facility] SERVICES [address] [place], [place], [address] Phone: [phone]   Fax:  [phone]   Name: Yvette Collier MRN: [id number] Date of Birth: 06/13/1941

## 2017-12-30 NOTE — Therapy (Signed)
[facility] MAIN [facility] SERVICES [address] [place], [place], [address] Phone: [phone]   Fax:  [phone]  Occupational Therapy Treatment  Patient Details  Name: Yvette Collier MRN: [id number] Date of Birth: 07-03-1941 Referring Provider (Historical): Dr. Wynn Banker   Encounter Date: 12/30/2017  OT End of Session - 12/30/17 1103    Visit Number  17    Number of Visits  48    Date for OT Re-Evaluation  03/03/18    OT Start Time  1100    OT Stop Time  1145    OT Time Calculation (min)  45 min    Activity Tolerance  Patient tolerated treatment well    Behavior During Therapy  [facility] for tasks assessed/performed       Past Medical History:  Diagnosis Date  . Asthma   . Chronic lower back pain   . COPD (chronic obstructive pulmonary disease) (HCC)   . Diabetes mellitus without complication (HCC)   . Gastrointestinal bleed   . GERD (gastroesophageal reflux disease)   . History of colon polyps   . Hypertension   . Hypertensive heart disease   . IBS (irritable bowel syndrome)   . Ischemic colitis (HCC)   . Non-obstructive Coronary Artery Disease    a. 05/2009 [facility]): minimal nonobs dzs.  . Osteoarthritis   . PAF (paroxysmal atrial fibrillation) (HCC)    a. 09/2014 during GI illness;  b. CHA2DS2VASc = 5 (not currently on OAC 2/2 h/o GIB/ischemic colitis); c. 09/2014 Echo: EF 60-65%, imparied relaxation, nl RV size/fxn.  . Transient cerebral ischemia due to atrial fibrillation [facility])     Past Surgical History:  Procedure Laterality Date  . ABDOMINAL HYSTERECTOMY    . APPENDECTOMY    . BACK SURGERY     x 3, last 2004.  . CHOLECYSTECTOMY    . COLONOSCOPY WITH PROPOFOL N/A 03/20/2017   Procedure: COLONOSCOPY WITH PROPOFOL;  Surgeon: Wyline Mood, MD;  Location: [facility] ENDOSCOPY;  Service: Endoscopy;  Laterality: N/A;  . ESOPHAGOGASTRODUODENOSCOPY (EGD) WITH PROPOFOL N/A 03/20/2017   Procedure: ESOPHAGOGASTRODUODENOSCOPY (EGD) WITH PROPOFOL;   Surgeon: Wyline Mood, MD;  Location: [facility] ENDOSCOPY;  Service: Endoscopy;  Laterality: N/A;  . FOOT SURGERY Right   . HEMORRHOID BANDING    . left total hip arthroplasty  2012  . STOMACH SURGERY    . TONSILLECTOMY      There were no vitals filed for this visit.  Subjective Assessment - 12/30/17 1101    Subjective   Pt. reports no back pain today.    Patient is accompained by:  Family member    Pertinent History  Pt. is a 77 y.o. female who suffered a Left temporla Lobe Hemorrhage secondary to Hypertensive Crisis, Moderate ICH, Left Frontal Trace SDH, and Traumatic ICH secondary to a fall. Pt. PMHX includes: HTN, DM with peripheral Neuropathy, Acute Kidney Injury, Hyperlipdidemia, and history of a GI Bleed. Pt. went the inpatient rehab at [facility], was discharged, and now is ready for outpatient rehab services.    Patient Stated Goals  Patient reports she would like to do as much as she can for herself.    Currently in Pain?  No/denies       OT TREATMENT    Neuro muscular re-education:  Pt. performed [facility] tasks using the Grooved pegboard. Pt. worked on grasping the grooved pegs from a horizontal position, and moving the pegs to a vertical position in the hand to prepare for placing them in  the grooved slot. Pt. Worked on grasping coins from a tabletop surface, placing them into a resistive container, and pushing them through the slot while isolating his 2nd digit.   Therapeutic Exercise:  Pt. performed 2# dowel ex. For UE strengthening secondary to weakness. Bilateral shoulder flexion, chest press, circular patterns, and elbow flexion/extension were performed. 2# dumbbell ex. for elbow flexion and extension, forearm supination/pronation, wrist flexion/extension, and radial deviation. Pt. requires rest breaks and verbal cues for proper technique.                         OT Education - 12/30/17 1102    Education provided  Yes    Education Details  [facility], cognitive  functioning.    Person(s) Educated  Patient    Methods  Explanation    Comprehension  Verbalized understanding         OT Long Term Goals - 12/09/17 0908      OT LONG TERM GOAL #1   Title  Pt. will be independent with basic ADL care needs.    Baseline  Eval: Pt. requires assist, 11/13: Pt. requires minA UE, ModA LE, 12/09/2018: Pt. conitnues to require assist.    Time  12    Period  Weeks    Status  On-going    Target Date  03/03/18      OT LONG TERM GOAL #2   Title  Pt. will be independent with light meal preparation.    Baseline  Eval: Pt. is unable, 11/04/2017: Pt. is able to assist with light cold meal prep, ahowver requires assist,. 12/09/2018: Pt. continues to require assist    Time  12    Period  Weeks    Status  On-going    Target Date  03/03/18      OT LONG TERM GOAL #3   Title  Pt. will be independent with tub/shower transfers    Baseline  Eval: Pt. requires assist, 11/04/2017: pt. conitnues to require assist using a shower seat.    Time  12    Period  Weeks    Status  On-going    Target Date  03/03/18      OT LONG TERM GOAL #4   Title  Pt. will increase BUE strength by 2 mm grades to assist with ADLs.    Baseline  Eval: BUE strength 4/5 overall. 12/09/2018: BUEs: shoulder flexion, and abduction 4/5, elbow flexion/extension: 4+/5    Time  12    Period  Weeks    Status  On-going    Target Date  03/03/18      OT LONG TERM GOAL #5   Title  Pt. will improve right [facility] skills to be able to assist with buttoning, and zipping.     Baseline  Eval: impaired, 11/04/2017:  Pt. continues to require work on improving [facility] skills for buttnoning, and zipping clothing. 12/08/2017: Pt. continues to require assist    Time  12    Period  Weeks    Status  On-going    Target Date  03/03/18      OT LONG TERM GOAL #6   Title  Pt. will improve right grip strength to be able to open containers.    Baseline  Eval: Limited. 11/04/2017: improving, 12/09/2017: Pt. continues to have  difficulty    Time  12    Period  Weeks    Status  On-going    Target Date  03/03/18  OT LONG TERM GOAL #7   Title  Pt. will identify potential safety hazards accurately during ADLs, and IADLs with 100% accuracy.    Baseline  Eval: limited 11/04/2017: Conitnues to be impaired, 12/09/2017: Pt. requires frequent cues.    Time  12    Period  Weeks    Status  On-going    Target Date  03/03/18      OT LONG TERM GOAL #8   Title  Pt. will demonstrate cognitive compensatory techniques 100% of the time with minimal cues.    Baseline  Eval: limite11/13/2018: conitnues to be limited. 12/09/2018: Pt. continues to require assist.    Time  12    Period  Weeks    Target Date  03/03/18            Plan - 12/30/17 1104    Clinical Impression Statement  Pt. continues to make progress. Pt. presents with limited UE strength, coordination, and cognitive functioning. Pt. conitnues to  work on improving UE functioning, strength, coordination, and cgnitive functioning for improved ADL, and IADL tasks. Pt. requires verbal cues, tactile cues, and visual demonstration for each task.    Occupational performance deficits (Please refer to evaluation for details):  ADL's;IADL's    Rehab Potential  Good    OT Frequency  2x / week    OT Duration  12 weeks    OT Treatment/Interventions  Self-care/ADL training;Therapeutic activities;Therapeutic exercise    Clinical Decision Making  Limited treatment options, no task modification necessary    Consulted and Agree with Plan of Care  Patient       Patient will benefit from skilled therapeutic intervention in order to improve the following deficits and impairments:  Abnormal gait, Decreased activity tolerance, Decreased cognition, Decreased balance, Decreased strength, Impaired UE functional use, Decreased coordination  Visit Diagnosis: Muscle weakness (generalized)  Other lack of coordination    Problem List Patient Active Problem List   Diagnosis  Date Noted  . Gait disturbance, post-stroke 09/04/2017  . Wernicke's aphasia 09/04/2017  . 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 However, needs to rule out CAA, tumor or CVT (vein of labbe)  09/01/2017  . Constipation 01/06/2017  . DJD (degenerative joint disease) of knee 07/18/2016  . GIB (gastrointestinal bleeding) 05/25/2016  . GI bleed 05/24/2016  . Degenerative joint disease (DJD) of hip 05/08/2016  . 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 Hypertensive heart disease   . Hemorrhage of gastrointestinal tract 11/13/2015  . Lumbosacral facet joint syndrome 07/26/2015  . Angioedema 07/13/2015  . DM2 (diabetes mellitus, type 2) (HCC) 07/13/2015  . Status post lumbar laminectomy 06/19/2015  . Lumbar radiculopathy 06/19/2015  . DDD (degenerative disc disease), lumbar 05/11/2015  . Facet syndrome, lumbar 05/11/2015  . Lumbar post-laminectomy syndrome 05/11/2015  . Sacroiliac joint disease 05/11/2015  . 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 Neuropathy due to secondary diabetes (HCC) 05/11/2015  . Essential hypertension 01/03/2015  . Coronary atherosclerosis of native coronary artery   . PAF (paroxysmal atrial fibrillation) (HCC) 10/20/2014  . Ischemic colitis (HCC) 10/20/2014  . Diabetes mellitus type 2 with complications (HCC) 10/20/2014  . COPD (chronic obstructive pulmonary disease) (HCC) 10/20/2014  . Hyperlipidemia 10/20/2014  . GERD (gastroesophageal reflux disease) 10/20/2014  . Ingrowing nail, right great toe 04/27/2014  . Internal hemorrhoid, bleeding 07/14/2013    Olegario Messier, MS, OTR/L 12/30/2017, 11:26 AM  [place]  [facility]IN [facility] [address] [place], [place], [address] Phone: [phone]   Fax:  [phone]  Name: Tomma RakersMary L Collier MRN: [id number] Date of Birth: [birth year]

## 2018-01-01 ENCOUNTER — Encounter: Payer: Self-pay | Admitting: Occupational Therapy

## 2018-01-01 ENCOUNTER — Ambulatory Visit: Payer: Medicare Other | Admitting: Speech Pathology

## 2018-01-01 ENCOUNTER — Other Ambulatory Visit: Payer: Self-pay

## 2018-01-01 ENCOUNTER — Ambulatory Visit: Payer: Medicare Other

## 2018-01-01 ENCOUNTER — Encounter: Payer: Self-pay | Admitting: Speech Pathology

## 2018-01-01 ENCOUNTER — Ambulatory Visit: Payer: Medicare Other | Admitting: Occupational Therapy

## 2018-01-01 DIAGNOSIS — R278 Other lack of coordination: Secondary | ICD-10-CM

## 2018-01-01 DIAGNOSIS — M6281 Muscle weakness (generalized): Secondary | ICD-10-CM | POA: Diagnosis not present

## 2018-01-01 DIAGNOSIS — R4701 Aphasia: Secondary | ICD-10-CM

## 2018-01-01 DIAGNOSIS — R2681 Unsteadiness on feet: Secondary | ICD-10-CM

## 2018-01-01 NOTE — Therapy (Signed)
[place] MAIN [facility] SERVICES [address] [place], [place], [address] Phone: [phone]   Fax:  [phone]  Physical Therapy Treatment  Patient Details  Name: Yvette Collier MRN: [id number] Date of Birth: [birth year] Referring Provider: BURNS, HARRIETT P   Encounter Date: 01/01/2018  PT End of Session - 01/01/18 1050    Visit Number  19    Number of Visits  41    Date for PT Re-Evaluation  02/03/18    PT Start Time  9326    PT Stop Time  1100    PT Time Calculation (min)  45 min    Equipment Utilized During Treatment  Gait belt    Activity Tolerance  Patient tolerated treatment well;Other (comment) Easily distracted    Behavior During Therapy  [facility] for tasks assessed/performed       Past Medical History:  Diagnosis Date  . Asthma   . Chronic lower back pain   . COPD (chronic obstructive pulmonary disease) ([place])   . Diabetes mellitus without complication ([place])   . Gastrointestinal bleed   . GERD (gastroesophageal reflux disease)   . History of colon polyps   . Hypertension   . Hypertensive heart disease   . IBS (irritable bowel syndrome)   . Ischemic colitis ([place])   . Non-obstructive Coronary Artery Disease    a. 05/2009 [facility]): minimal nonobs dzs.  . Osteoarthritis   . PAF (paroxysmal atrial fibrillation) ([place])    a. 09/2014 during GI illness;  b. CHA2DS2VASc = 5 (not currently on [place] 2/2 h/o GIB/ischemic colitis); c. 09/2014 Echo: EF 60-65%, imparied relaxation, nl RV size/fxn.  . Transient cerebral ischemia due to atrial fibrillation [facility])     Past Surgical History:  Procedure Laterality Date  . ABDOMINAL HYSTERECTOMY    . APPENDECTOMY    . BACK SURGERY     x 3, last 2004.  . CHOLECYSTECTOMY    . COLONOSCOPY WITH PROPOFOL N/A 03/20/2017   Procedure: COLONOSCOPY WITH PROPOFOL;  Surgeon: Jonathon Bellows, MD;  Location: [facility] ENDOSCOPY;  Service: Endoscopy;  Laterality: N/A;  . ESOPHAGOGASTRODUODENOSCOPY (EGD) WITH PROPOFOL N/A  03/20/2017   Procedure: ESOPHAGOGASTRODUODENOSCOPY (EGD) WITH PROPOFOL;  Surgeon: Jonathon Bellows, MD;  Location: [facility] ENDOSCOPY;  Service: Endoscopy;  Laterality: N/A;  . FOOT SURGERY Right   . HEMORRHOID BANDING    . left total hip arthroplasty  2012  . STOMACH SURGERY    . TONSILLECTOMY      There were no vitals filed for this visit.  Subjective Assessment - 01/01/18 1043    Subjective  Patient having some irritable bowl today. requires bathroom break.     Pertinent History  Yvette Collier a 77 y.o.right handed femalewith history of hypertension,[name],diabetes mellitus, COPD. Patient lives alone independent prior to admission and works as a Theme park manager. One level home. Admitted 09/01/2017 with altered mental statusright side weaknessas well as aphasia. CT of the head showed a large acute left temporal lobe hematoma approximately 15.9 mL suspect hypertensive hemorrhage.Pt presents today with fluent aphasia.    Limitations  Walking    How long can you sit comfortably?  30 minutes before needing to shift positions    How long can you stand comfortably?  about 30 minutes before needing to sit and rest    How long can you walk comfortably?  states that she has no issues with walking    Patient Stated Goals  "get stronger"  Currently in Pain?  No/denies      Ambulating 200 ft with supervision  Bathroom mobility , cues for turning and sequencing of events. Upright posture, balancing while locking and unlocking stall, SPT.   Standing #4 ankle weights- require visual cuing   Flexion 2x 10  Abduction 2x10  Extension 2x 10  seated knee extension with abduction squeezing ball between feet. 15x  High knee marching in // bars 8x   Heel raises 15x Toes raises 15x   Airex balance beam: side step 10x , forward tandem walking 10x   Airex pad: 6 inch toe taps   Pt. response to medical necessity:  Patient will continue to benefit from skilled  physical therapy to improve dynamic standing balance and increase strength in order to decrease fall risk.                 PT Education - 01/01/18 1049    Education provided  Yes    Education Details  bathroom mobility. functional mobility     Person(s) Educated  Patient    Methods  Explanation;Demonstration;Verbal cues;Tactile cues    Comprehension  Verbalized understanding;Returned demonstration;Verbal cues required;Tactile cues required;Need further instruction       PT Short Term Goals - 12/09/17 1151      PT SHORT TERM GOAL #1   Title  Pt will improve 5x sit to stand to 16 seconds in order to demonstrate improved LE strength and improved dynamic balance to decrease risk for falls.    Baseline  18 sec, 10/15/17: 21.41 s,  22.48 sec    Time  6    Period  Weeks    Status  On-going      PT SHORT TERM GOAL #2   Title  Pt will improve gait speed to at least 0.8 m/s, demonstrating improved LE strength in order to safely ambulate with decreased risk for fals.    Baseline  0.688 m/s, 10/15/17: 1.0 m/s, , . 62 m/sec    Time  6    Period  Weeks    Status  Achieved      PT SHORT TERM GOAL #3   Title  Pt will improve TUG to at least 16 seconds in order to demonstrate improve LE strength and dynamic balance.    Baseline  19 sec; 10/15/17: 11.23s,     Time  6    Period  Weeks    Status  Achieved      PT SHORT TERM GOAL #4   Title  Pt will improve Berg Balance score to 46/56 in order to demonstarte improved dynamic and static balance in order to decrease overall falls risk.    Baseline  41/56; 51/56    Time  6    Period  Weeks    Status  Achieved      PT SHORT TERM GOAL #5   Title  Patient will increase dynamic gait index score to >15/24 as to demonstrate reduced fall risk and improved dynamic gait balance for better safety with community/home ambulation.     Baseline  13/24    Time  2    Period  Weeks    Status  New    Target Date  12/23/17        PT Long Term  Goals - 12/09/17 1152      PT LONG TERM GOAL #1   Title  Pt will demonstrate independence with HEP in order to continue LE strengthening and balance interventions at home  to manage symptoms and decrease overall falls risk.    Baseline  HEP compliant    Time  12    Period  Weeks    Status  On-going      PT LONG TERM GOAL #2   Title  Pt will improve 5x sit to stand to <15 seconds in order to demonstrate improved LE strength and improved balance in order to decrease falls risk.    Baseline  18 sec; 10/15/17: 21.41s, 22.48    Time  12    Period  Weeks    Status  On-going      PT LONG TERM GOAL #3   Title  Pt will improve gait speed to at least 1.0 m/s in order to demonstrate improved LE strength and balance in order to ambulate with decreased risk for falls.    Baseline  0.688 m/s; 10/15/17: 1.0 m/s     Time  12    Period  Weeks    Status  Achieved      PT LONG TERM GOAL #4   Title  Pt will demonstrate an improved TUG score to < or equal to 14 seconds in order to demonstrate improved LE strength and balance in order to decrease overall falls risk.    Baseline  19 sec; 10/15/17: 11.23s    Time  12    Period  Weeks    Status  Achieved      PT LONG TERM GOAL #5   Title  Pt will demonstrate an improved Berg Balance score to at least 51/56 in order to demonstrate improved dynamic and static balance activities.    Baseline  41/56; 10/15/17: 51/56    Time  12    Period  Weeks    Status  Achieved      Additional Long Term Goals   Additional Long Term Goals  Yes      PT LONG TERM GOAL #6   Title  Pt will improve LE strength to 4+/5 in all limited planes in order to increase funtional abilities and improve gait.    Baseline  Gross strength assessment: 3+/5, L hip ext 2+/5, R hip ext 3-/5; 10/15/17: hip extension 3/5 bilaterally, hip abduction/adduction: 3+/5 on R side, 4-/5 on L side    Time  12    Period  Weeks    Status  On-going      PT LONG TERM GOAL #7   Title  Pt will improve  gait speed to at least 1.2 m/s in order to demonstrate improved LE strength and balance in order to ambulate with decreased risk for falls.    Baseline  0.688 m/s; 10/15/17: 1.0 m/s, .62 /sec 12/18: .67 m/s    Time  8    Period  Weeks    Status  Partially Met      PT LONG TERM GOAL #8   Title  Pt will demonstrate an improved Berg Balance score to at least 54/56 in order to demonstrate improved dynamic and static balance activities.    Baseline  41/56; 10/15/17: 51/56 12/18: 52/56    Time  8    Period  Weeks    Status  On-going      PT LONG TERM GOAL  #9   TITLE  Patient will increase dynamic gait index score to >19/24 as to demonstrate reduced fall risk and improved dynamic gait balance for better safety with community/home ambulation.     Baseline  12/18: 13/24    Time  8    Period  Weeks    Status  New    Target Date  02/03/18            Plan - 01/01/18 1058    Clinical Impression Statement  Patient presents with increased confusion today requiring excessive verbal, visual, and demonstrative cueing.  Patient more stable when performing dynamic motion/activities. Patient more impulsive and required CGA at all times.  Patient will continue to benefit from skilled physical therapy to improve dynamic standing balance and increase strength in order to decrease fall risk.    Rehab Potential  Good    PT Frequency  2x / week    PT Duration  8 weeks    PT Treatment/Interventions  Aquatic Therapy;Cryotherapy;Moist Heat;Gait training;Stair training;Functional mobility training;Therapeutic activities;Therapeutic exercise;Balance training;Neuromuscular re-education;Patient/family education;Manual techniques;Passive range of motion    PT Next Visit Plan  begin LE strength and dynamic balance activities    PT Home Exercise Plan  seated marches, seated hip ABD with red theraband    Consulted and Agree with Plan of Care  Patient       Patient will benefit from skilled therapeutic  intervention in order to improve the following deficits and impairments:  Abnormal gait, Decreased coordination, Decreased activity tolerance, Decreased balance, Decreased cognition, Decreased mobility, Decreased safety awareness, Decreased strength, Difficulty walking  Visit Diagnosis: Muscle weakness (generalized)  Other lack of coordination  Unsteadiness on feet     Problem List Patient Active Problem List   Diagnosis Date Noted  . Gait disturbance, post-stroke 09/04/2017  . Wernicke's aphasia 09/04/2017  . 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 However, needs to rule out CAA, tumor or CVT (vein of labbe)  09/01/2017  . Constipation 01/06/2017  . DJD (degenerative joint disease) of knee 07/18/2016  . GIB (gastrointestinal bleeding) 05/25/2016  . GI bleed 05/24/2016  . Degenerative joint disease (DJD) of hip 05/08/2016  . 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 Hypertensive heart disease   . Hemorrhage of gastrointestinal tract 11/13/2015  . Lumbosacral facet joint syndrome 07/26/2015  . Angioedema 07/13/2015  . DM2 (diabetes mellitus, type 2) ([place]) 07/13/2015  . Status post lumbar laminectomy 06/19/2015  . Lumbar radiculopathy 06/19/2015  . DDD (degenerative disc disease), lumbar 05/11/2015  . Facet syndrome, lumbar 05/11/2015  . Lumbar post-laminectomy syndrome 05/11/2015  . Sacroiliac joint disease 05/11/2015  . 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 Neuropathy due to secondary diabetes ([place]) 05/11/2015  . Essential hypertension 01/03/2015  . Coronary atherosclerosis of native coronary artery   . PAF (paroxysmal atrial fibrillation) ([place]) 10/20/2014  . Ischemic colitis ([place]) 10/20/2014  . Diabetes mellitus type 2 with complications ([place]) 50/35/4656  . COPD (chronic obstructive pulmonary disease) ([place]) 10/20/2014  . Hyperlipidemia 10/20/2014   . GERD (gastroesophageal reflux disease) 10/20/2014  . Ingrowing nail, right great toe 04/27/2014  . Internal hemorrhoid, bleeding 07/14/2013   Janna Arch, PT, DPT   Janna Arch 01/01/2018, 11:00 AM  [place] MAIN [facility] SERVICES [address] [place], [place], [address] Phone: [phone]   Fax:  [phone]  Name: Yvette Collier MRN: [id number] Date of Birth: 22-Dec-1941

## 2018-01-01 NOTE — Therapy (Signed)
[place] MAIN [facility] SERVICES [address] [place], [place], [address] Phone: [phone]   Fax:  [phone]  Speech Language Pathology Treatment  Patient Details  Name: Yvette Collier MRN: [id number] Date of Birth: 20-Jan-1941 Referring Provider: Dr. Letta Pate   Encounter Date: 01/01/2018  End of Session - 01/01/18 1331    Visit Number  16    Number of Visits  25    Date for SLP Re-Evaluation  01/19/18    SLP Start Time  0900    SLP Stop Time   0951    SLP Time Calculation (min)  51 min    Activity Tolerance  Patient tolerated treatment well       Past Medical History:  Diagnosis Date  . Asthma   . Chronic lower back pain   . COPD (chronic obstructive pulmonary disease) ([place])   . Diabetes mellitus without complication ([place])   . Gastrointestinal bleed   . GERD (gastroesophageal reflux disease)   . History of colon polyps   . Hypertension   . Hypertensive heart disease   . IBS (irritable bowel syndrome)   . Ischemic colitis ([place])   . Non-obstructive Coronary Artery Disease    a. 05/2009 Cath [facility]): minimal nonobs dzs.  . Osteoarthritis   . PAF (paroxysmal atrial fibrillation) ([place])    a. 09/2014 during GI illness;  b. CHA2DS2VASc = 5 (not currently on [place] 2/2 h/o GIB/ischemic colitis); c. 09/2014 Echo: EF 60-65%, imparied relaxation, nl RV size/fxn.  . Transient cerebral ischemia due to atrial fibrillation [facility])     Past Surgical History:  Procedure Laterality Date  . ABDOMINAL HYSTERECTOMY    . APPENDECTOMY    . BACK SURGERY     x 3, last 2004.  . CHOLECYSTECTOMY    . COLONOSCOPY WITH PROPOFOL N/A 03/20/2017   Procedure: COLONOSCOPY WITH PROPOFOL;  Surgeon: Jonathon Bellows, MD;  Location: [facility] ENDOSCOPY;  Service: Endoscopy;  Laterality: N/A;  . ESOPHAGOGASTRODUODENOSCOPY (EGD) WITH PROPOFOL N/A 03/20/2017   Procedure: ESOPHAGOGASTRODUODENOSCOPY (EGD) WITH PROPOFOL;  Surgeon: Jonathon Bellows, MD;  Location: [facility] ENDOSCOPY;  Service:  Endoscopy;  Laterality: N/A;  . FOOT SURGERY Right   . HEMORRHOID BANDING    . left total hip arthroplasty  2012  . STOMACH SURGERY    . TONSILLECTOMY      There were no vitals filed for this visit.  Subjective Assessment - 01/01/18 1330    Subjective  The patient states she is feeling well today.    Currently in Pain?  No/denies            ADULT SLP TREATMENT - 01/01/18 0001      General Information   Behavior/Cognition  Alert;Cooperative;Pleasant mood;Requires cueing    HPI  Patient is a 77 y/o woman with left temproal lobe hemorrhage on 09/01/17.      Treatment Provided   Treatment provided  Cognitive-Linquistic      Cognitive-Linquistic Treatment   Treatment focused on  Aphasia    Skilled Treatment  VERBAL EXPRESSION: Read 3 word phrases independently with 85% accuracy.  Name pictured object given written phrase completion cue with 20% accuracy, improves to 95% given written word.  Name common object given written label with 80% accuracy. COMPREHENSION:  Complete 3 unit processing tasks with 75% accuracy given repetition and emphasis.  Identify object (f=10) with 100% accuracy with no repetition of stimulus.  Match written word to object with 70% accuracy.      Assessment / Recommendations /  Plan   Plan  Continue with current plan of care      Progression Toward Goals   Progression toward goals  Progressing toward goals       SLP Education - 01/01/18 1330    Education provided  Yes    Education Details  listen to verbal information    Person(s) Educated  Patient    Methods  Explanation    Comprehension  Verbalized understanding         SLP Long Term Goals - 11/19/17 1138      SLP LONG TERM GOAL #1   Title  Patient will complete 2 unit processing tasks with 80% accuracy without the need of repetition of task instructions or significant delays in responding.    Status  Achieved      SLP LONG TERM GOAL #2   Title  Patient will complete 3 unit processing  tasks with 80% accuracy without the need of repetition of task instructions or significant delays in responding.    Status  Partially Met      SLP LONG TERM GOAL #3   Title  Patient will name common objects with 50% accuracy.    Status  Not Met      SLP LONG TERM GOAL #4   Title  Patient will name common objects with 80% accuracy.    Status  Not Met      SLP LONG TERM GOAL #5   Title  Patient will generate meaningful phrase to complete simple/concrete linguistic task with 80% accuracy.    Status  Not Met      Additional Long Term Goals   Additional Long Term Goals  Yes      SLP LONG TERM GOAL #6   Title  Patient will read aloud and follow written commands with 80% accuracy    Time  8    Period  Weeks    Status  New    Target Date  01/19/18      SLP LONG TERM GOAL #7   Title  Patient will name pictured object given written phrase completion stimulus with 80% accuracy.    Time  8    Period  Weeks    Status  New    Target Date  01/19/18       Plan - 01/01/18 1331    Clinical Impression Statement  The patient is generating more intelligible and appropriate speech as her auditory comprehension is improving.  The patient continues to improve in overall communicative effectiveness.  She continues to display phonemic paraphasias, neologisms, and perseverations in open-ended verbal expression tasks.      Speech Therapy Frequency  2x / week    Duration  Other (comment)    Treatment/Interventions  Language facilitation;Multimodal communcation approach;SLP instruction and feedback;Patient/family education    Potential to Achieve Goals  Good    Potential Considerations  Ability to learn/carryover information;Co-morbidities;Cooperation/participation level;Medical prognosis;Pain level;Previous level of function;Severity of impairments;Family/community support    SLP Home Exercise Plan  read aloud 3- and 4-letter words    Consulted and Agree with Plan of Care  Patient       Patient will  benefit from skilled therapeutic intervention in order to improve the following deficits and impairments:   Aphasia    Problem List Patient Active Problem List   Diagnosis Date Noted  . Gait disturbance, post-stroke 09/04/2017  . Wernicke's aphasia 09/04/2017  . Left temporal lobe hemorrhage (HCC) -  left temporal moderate ICH and left frontal  trace SDH, concerning for traumatic ICH due to fall. However, needs to rule out CAA, tumor or CVT (vein of labbe)  09/01/2017  . Constipation 01/06/2017  . DJD (degenerative joint disease) of knee 07/18/2016  . GIB (gastrointestinal bleeding) 05/25/2016  . GI bleed 05/24/2016  . Degenerative joint disease (DJD) of hip 05/08/2016  . 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 Hypertensive heart disease   . Hemorrhage of gastrointestinal tract 11/13/2015  . Lumbosacral facet joint syndrome 07/26/2015  . Angioedema 07/13/2015  . DM2 (diabetes mellitus, type 2) ([place]) 07/13/2015  . Status post lumbar laminectomy 06/19/2015  . Lumbar radiculopathy 06/19/2015  . DDD (degenerative disc disease), lumbar 05/11/2015  . Facet syndrome, lumbar 05/11/2015  . Lumbar post-laminectomy syndrome 05/11/2015  . Sacroiliac joint disease 05/11/2015  . 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 Neuropathy due to secondary diabetes ([place]) 05/11/2015  . Essential hypertension 01/03/2015  . Coronary atherosclerosis of native coronary artery   . PAF (paroxysmal atrial fibrillation) ([place]) 10/20/2014  . Ischemic colitis ([place]) 10/20/2014  . Diabetes mellitus type 2 with complications ([place]) 15/93/0123  . COPD (chronic obstructive pulmonary disease) ([place]) 10/20/2014  . Hyperlipidemia 10/20/2014  . GERD (gastroesophageal reflux disease) 10/20/2014  . Ingrowing nail, right great toe 04/27/2014  . Internal hemorrhoid, bleeding 07/14/2013   Leroy Sea, MS/CCC- SLP  Lou Miner 01/01/2018, 1:32 PM  [place] MAIN [facility] SERVICES [address] [place], [place], [address] Phone: [phone]   Fax:  [phone]   Name: Yvette Collier MRN: [id number] Date of Birth: [birth year]

## 2018-01-01 NOTE — Therapy (Signed)
[place] [facility] MAIN [facility] SERVICES [address] [place], [place], [address] Phone: [phone]   Fax:  [phone]  Occupational Therapy Treatment  Patient Details  Name: Yvette Collier MRN: [id number] Date of Birth: 01-18-1941 Referring Provider: BURNS, HARRIETT P   Encounter Date: 01/01/2018  OT End of Session - 01/01/18 1109    Visit Number  18    Number of Visits  48    Date for OT Re-Evaluation  03/03/18    OT Start Time  1100    OT Stop Time  1145    OT Time Calculation (min)  45 min    Activity Tolerance  Patient tolerated treatment well    Behavior During Therapy  [facility] for tasks assessed/performed       Past Medical History:  Diagnosis Date  . Asthma   . Chronic lower back pain   . COPD (chronic obstructive pulmonary disease) (HCC)   . Diabetes mellitus without complication (HCC)   . Gastrointestinal bleed   . GERD (gastroesophageal reflux disease)   . History of colon polyps   . Hypertension   . Hypertensive heart disease   . IBS (irritable bowel syndrome)   . Ischemic colitis (HCC)   . Non-obstructive Coronary Artery Disease    a. 05/2009 Cath [facility]): minimal nonobs dzs.  . Osteoarthritis   . PAF (paroxysmal atrial fibrillation) (HCC)    a. 09/2014 during GI illness;  b. CHA2DS2VASc = 5 (not currently on [facility] 2/2 h/o GIB/ischemic colitis); c. 09/2014 Echo: EF 60-65%, imparied relaxation, nl RV size/fxn.  . Transient cerebral ischemia due to atrial fibrillation [facility])     Past Surgical History:  Procedure Laterality Date  . ABDOMINAL HYSTERECTOMY    . APPENDECTOMY    . BACK SURGERY     x 3, last 2004.  . CHOLECYSTECTOMY    . COLONOSCOPY WITH PROPOFOL N/A 03/20/2017   Procedure: COLONOSCOPY WITH PROPOFOL;  Surgeon: Wyline Mood, MD;  Location: [facility] ENDOSCOPY;  Service: Endoscopy;  Laterality: N/A;  . ESOPHAGOGASTRODUODENOSCOPY (EGD) WITH PROPOFOL N/A 03/20/2017   Procedure: ESOPHAGOGASTRODUODENOSCOPY (EGD) WITH PROPOFOL;   Surgeon: Wyline Mood, MD;  Location: [facility] ENDOSCOPY;  Service: Endoscopy;  Laterality: N/A;  . FOOT SURGERY Right   . HEMORRHOID BANDING    . left total hip arthroplasty  2012  . STOMACH SURGERY    . TONSILLECTOMY      There were no vitals filed for this visit.  Subjective Assessment - 01/01/18 1108    Subjective   Pt. reports having bowel care issues today.    Patient is accompained by:  Family member    Pertinent History  Pt. is a 77 y.o. female who suffered a Left temporla Lobe Hemorrhage secondary to Hypertensive Crisis, Moderate ICH, Left Frontal Trace SDH, and Traumatic ICH secondary to a fall. Pt. PMHX includes: HTN, DM with peripheral Neuropathy, Acute Kidney Injury, Hyperlipdidemia, and history of a GI Bleed. Pt. went the inpatient rehab at [facility], was discharged, and now is ready for outpatient rehab services.      OT TREATMENT    Neuro muscular re-education:  Pt. worked on grasping coins from a tabletop surface, placing them into a resistive container, and pushing them through the slot while isolating his 2nd digit. Pt. worked on sorting the coins. Pt. requires increased cues, and step by step tasks for the coins. Pt. worked on [employer].  Therapeutic Exercise:  Pt. performed gross gripping with grip strengthener. Pt. worked on sustaining grip  while grasping pegs and reaching at various heights. Gripper was placed in the 3rd resistive slot with the [place] resistive spring. Pt. Required step-by-step instructions. Selfcare:                          OT Education - 01/01/18 1109    Education provided  Yes    Education Details  strength, and coordination skills    Person(s) Educated  Patient    Methods  Explanation;Demonstration;Verbal cues    Comprehension  Verbalized understanding;Returned demonstration;Verbal cues required;Tactile cues required;Need further instruction          OT Long Term Goals - 12/09/17 0908      OT LONG TERM GOAL  #1   Title  Pt. will be independent with basic ADL care needs.    Baseline  Eval: Pt. requires assist, 11/13: Pt. requires minA UE, ModA LE, 12/09/2018: Pt. conitnues to require assist.    Time  12    Period  Weeks    Status  On-going    Target Date  03/03/18      OT LONG TERM GOAL #2   Title  Pt. will be independent with light meal preparation.    Baseline  Eval: Pt. is unable, 11/04/2017: Pt. is able to assist with light cold meal prep, ahowver requires assist,. 12/09/2018: Pt. continues to require assist    Time  12    Period  Weeks    Status  On-going    Target Date  03/03/18      OT LONG TERM GOAL #3   Title  Pt. will be independent with tub/shower transfers    Baseline  Eval: Pt. requires assist, 11/04/2017: pt. conitnues to require assist using a shower seat.    Time  12    Period  Weeks    Status  On-going    Target Date  03/03/18      OT LONG TERM GOAL #4   Title  Pt. will increase BUE strength by 2 mm grades to assist with ADLs.    Baseline  Eval: BUE strength 4/5 overall. 12/09/2018: BUEs: shoulder flexion, and abduction 4/5, elbow flexion/extension: 4+/5    Time  12    Period  Weeks    Status  On-going    Target Date  03/03/18      OT LONG TERM GOAL #5   Title  Pt. will improve right [facility] skills to be able to assist with buttoning, and zipping.     Baseline  Eval: impaired, 11/04/2017:  Pt. continues to require work on improving [facility] skills for buttnoning, and zipping clothing. 12/08/2017: Pt. continues to require assist    Time  12    Period  Weeks    Status  On-going    Target Date  03/03/18      OT LONG TERM GOAL #6   Title  Pt. will improve right grip strength to be able to open containers.    Baseline  Eval: Limited. 11/04/2017: improving, 12/09/2017: Pt. continues to have difficulty    Time  12    Period  Weeks    Status  On-going    Target Date  03/03/18      OT LONG TERM GOAL #7   Title  Pt. will identify potential safety hazards accurately during  ADLs, and IADLs with 100% accuracy.    Baseline  Eval: limited 11/04/2017: Conitnues to be impaired, 12/09/2017: Pt. requires frequent cues.  Time  12    Period  Weeks    Status  On-going    Target Date  03/03/18      OT LONG TERM GOAL #8   Title  Pt. will demonstrate cognitive compensatory techniques 100% of the time with minimal cues.    Baseline  Eval: limite11/13/2018: conitnues to be limited. 12/09/2018: Pt. continues to require assist.    Time  12    Period  Weeks    Target Date  03/03/18            Plan - 01/01/18 1110    Clinical Impression Statement Pt. requires constant cues today for redirection during the tasks. Pt. perseverates on the position the pegs when putting them in the bin. Pt. perseverated on organizing, and aligning the pegs in the bin. Pt. continues to work on improving UE functioing, strength, coordination, and ADL functioning.     Occupational performance deficits (Please refer to evaluation for details):  ADL's;IADL's    Rehab Potential  Good    OT Frequency  2x / week    OT Duration  12 weeks    OT Treatment/Interventions  Self-care/ADL training;Therapeutic activities;Therapeutic exercise    Clinical Decision Making  Limited treatment options, no task modification necessary       Patient will benefit from skilled therapeutic intervention in order to improve the following deficits and impairments:  Abnormal gait, Decreased activity tolerance, Decreased cognition, Decreased balance, Decreased strength, Impaired UE functional use, Decreased coordination  Visit Diagnosis: Muscle weakness (generalized)  Other lack of coordination    Problem List Patient Active Problem List   Diagnosis Date Noted  . Gait disturbance, post-stroke 09/04/2017  . Wernicke's aphasia 09/04/2017  . 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 However, needs to rule out CAA, tumor  or CVT (vein of labbe)  09/01/2017  . Constipation 01/06/2017  . DJD (degenerative joint disease) of knee 07/18/2016  . GIB (gastrointestinal bleeding) 05/25/2016  . GI bleed 05/24/2016  . Degenerative joint disease (DJD) of hip 05/08/2016  . 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 Hypertensive heart disease   . Hemorrhage of gastrointestinal tract 11/13/2015  . Lumbosacral facet joint syndrome 07/26/2015  . Angioedema 07/13/2015  . DM2 (diabetes mellitus, type 2) (HCC) 07/13/2015  . Status post lumbar laminectomy 06/19/2015  . Lumbar radiculopathy 06/19/2015  . DDD (degenerative disc disease), lumbar 05/11/2015  . Facet syndrome, lumbar 05/11/2015  . Lumbar post-laminectomy syndrome 05/11/2015  . Sacroiliac joint disease 05/11/2015  . 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 Neuropathy due to secondary diabetes (HCC) 05/11/2015  . Essential hypertension 01/03/2015  . Coronary atherosclerosis of native coronary artery   . PAF (paroxysmal atrial fibrillation) (HCC) 10/20/2014  . Ischemic colitis (HCC) 10/20/2014  . Diabetes mellitus type 2 with complications (HCC) 10/20/2014  . COPD (chronic obstructive pulmonary disease) (HCC) 10/20/2014  . Hyperlipidemia 10/20/2014  . GERD (gastroesophageal reflux disease) 10/20/2014  . Ingrowing nail, right great toe 04/27/2014  . Internal hemorrhoid, bleeding 07/14/2013    Olegario Messier, MS, OTR/L 01/01/2018, 11:20 AM  [place] [facility] MAIN [facility] SERVICES [address] [place], [place], [address] Phone: [phone]   Fax:  [phone]  Name: Yvette Collier MRN: [id number] Date of Birth: 16-Apr-1941

## 2018-01-06 ENCOUNTER — Ambulatory Visit: Payer: Medicare Other

## 2018-01-06 ENCOUNTER — Ambulatory Visit: Payer: Medicare Other | Admitting: Speech Pathology

## 2018-01-06 ENCOUNTER — Ambulatory Visit: Payer: Medicare Other | Admitting: Occupational Therapy

## 2018-01-06 ENCOUNTER — Encounter: Payer: Self-pay | Admitting: Occupational Therapy

## 2018-01-06 DIAGNOSIS — R278 Other lack of coordination: Secondary | ICD-10-CM

## 2018-01-06 DIAGNOSIS — M6281 Muscle weakness (generalized): Secondary | ICD-10-CM | POA: Diagnosis not present

## 2018-01-06 DIAGNOSIS — R2681 Unsteadiness on feet: Secondary | ICD-10-CM

## 2018-01-06 NOTE — Therapy (Signed)
[facility] MAIN [facility] SERVICES [address] [place], [place], [address] Phone: [phone]   Fax:  [phone]  Occupational Therapy Treatment  Patient Details  Name: JOELY LOSIER MRN: [id number] Date of Birth: [birth year] Referring Provider: BURNS, HARRIETT P   Encounter Date: 01/06/2018  OT End of Session - 01/06/18 1110    Visit Number  19    Number of Visits  48    Date for OT Re-Evaluation  03/03/18    OT Start Time  1100    OT Stop Time  1145    OT Time Calculation (min)  45 min    Activity Tolerance  Patient tolerated treatment well    Behavior During Therapy  [facility] for tasks assessed/performed       Past Medical History:  Diagnosis Date  . Asthma   . Chronic lower back pain   . COPD (chronic obstructive pulmonary disease) (HCC)   . Diabetes mellitus without complication (HCC)   . Gastrointestinal bleed   . GERD (gastroesophageal reflux disease)   . History of colon polyps   . Hypertension   . Hypertensive heart disease   . IBS (irritable bowel syndrome)   . Ischemic colitis (HCC)   . Non-obstructive Coronary Artery Disease    a. 05/2009 Cath [facility]): minimal nonobs dzs.  . Osteoarthritis   . PAF (paroxysmal atrial fibrillation) (HCC)    a. 09/2014 during GI illness;  b. CHA2DS2VASc = 5 (not currently on OAC 2/2 h/o GIB/ischemic colitis); c. 09/2014 Echo: EF 60-65%, imparied relaxation, nl RV size/fxn.  . Transient cerebral ischemia due to atrial fibrillation [facility])     Past Surgical History:  Procedure Laterality Date  . ABDOMINAL HYSTERECTOMY    . APPENDECTOMY    . BACK SURGERY     x 3, last 2004.  . CHOLECYSTECTOMY    . COLONOSCOPY WITH PROPOFOL N/A 03/20/2017   Procedure: COLONOSCOPY WITH PROPOFOL;  Surgeon: Wyline Mood, MD;  Location: [facility] ENDOSCOPY;  Service: Endoscopy;  Laterality: N/A;  . ESOPHAGOGASTRODUODENOSCOPY (EGD) WITH PROPOFOL N/A 03/20/2017   Procedure: ESOPHAGOGASTRODUODENOSCOPY (EGD) WITH PROPOFOL;   Surgeon: Wyline Mood, MD;  Location: [facility] ENDOSCOPY;  Service: Endoscopy;  Laterality: N/A;  . FOOT SURGERY Right   . HEMORRHOID BANDING    . left total hip arthroplasty  2012  . STOMACH SURGERY    . TONSILLECTOMY      There were no vitals filed for this visit.  Subjective Assessment - 01/06/18 1108    Subjective   Pt. reports feeling much better today.    Patient is accompained by:  Family member    Pertinent History  Pt. is a 77 y.o. female who suffered a Left temporla Lobe Hemorrhage secondary to Hypertensive Crisis, Moderate ICH, Left Frontal Trace SDH, and Traumatic ICH secondary to a fall. Pt. PMHX includes: HTN, DM with peripheral Neuropathy, Acute Kidney Injury, Hyperlipdidemia, and history of a GI Bleed. Pt. went the inpatient rehab at [facility], was discharged, and now is ready for outpatient rehab services.    Currently in Pain?  No/denies       OT TREATMENT    Neuro muscular re-education:  Pt. Worked on [facility] skills grasping, and holding 1/4" pegs, and placing them in a pegboard. Pt. worked on gasping, and holding the pegs. Pt. worked on on copying a simple Dispensing optician. Pt. requires cues verbal cues, step-by-step cognitive cues, and assist, and visual demonstration for the task.  Pt. requires increased time to complete.  OT Education - 01/06/18 1109    Education provided  Yes    Education Details  [facility]    Person(s) Educated  Patient    Methods  Explanation;Demonstration;Verbal cues    Comprehension  Verbalized understanding;Returned demonstration         OT Long Term Goals - 12/09/17 0908      OT LONG TERM GOAL #1   Title  Pt. will be independent with basic ADL care needs.    Baseline  Eval: Pt. requires assist, 11/13: Pt. requires minA UE, ModA LE, 12/09/2018: Pt. conitnues to require assist.    Time  12    Period  Weeks    Status  On-going    Target Date  03/03/18      OT LONG TERM GOAL #2   Title  Pt. will  be independent with light meal preparation.    Baseline  Eval: Pt. is unable, 11/04/2017: Pt. is able to assist with light cold meal prep, ahowver requires assist,. 12/09/2018: Pt. continues to require assist    Time  12    Period  Weeks    Status  On-going    Target Date  03/03/18      OT LONG TERM GOAL #3   Title  Pt. will be independent with tub/shower transfers    Baseline  Eval: Pt. requires assist, 11/04/2017: pt. conitnues to require assist using a shower seat.    Time  12    Period  Weeks    Status  On-going    Target Date  03/03/18      OT LONG TERM GOAL #4   Title  Pt. will increase BUE strength by 2 mm grades to assist with ADLs.    Baseline  Eval: BUE strength 4/5 overall. 12/09/2018: BUEs: shoulder flexion, and abduction 4/5, elbow flexion/extension: 4+/5    Time  12    Period  Weeks    Status  On-going    Target Date  03/03/18      OT LONG TERM GOAL #5   Title  Pt. will improve right [employer] skills to be able to assist with buttoning, and zipping.     Baseline  Eval: impaired, 11/04/2017:  Pt. continues to require work on improving [facility] skills for buttnoning, and zipping clothing. 12/08/2017: Pt. continues to require assist    Time  12    Period  Weeks    Status  On-going    Target Date  03/03/18      OT LONG TERM GOAL #6   Title  Pt. will improve right grip strength to be able to open containers.    Baseline  Eval: Limited. 11/04/2017: improving, 12/09/2017: Pt. continues to have difficulty    Time  12    Period  Weeks    Status  On-going    Target Date  03/03/18      OT LONG TERM GOAL #7   Title  Pt. will identify potential safety hazards accurately during ADLs, and IADLs with 100% accuracy.    Baseline  Eval: limited 11/04/2017: Conitnues to be impaired, 12/09/2017: Pt. requires frequent cues.    Time  12    Period  Weeks    Status  On-going    Target Date  03/03/18      OT LONG TERM GOAL #8   Title  Pt. will demonstrate cognitive compensatory techniques  100% of the time with minimal cues.    Baseline  Eval: limite11/13/2018: conitnues to be limited. 12/09/2018:  Pt. continues to require assist.    Time  12    Period  Weeks    Target Date  03/03/18            Plan - 01/06/18 1111    Clinical Impression Statement  Pt. requires verbal cues, and cognitive acues and assist to complete a [facility] task using 1/4" pegs. Pt. attempted following a pattern design, however had difficulty. Pt. requires verbal cues, and visual demonstration for [facility] hand movements.    Occupational performance deficits (Please refer to evaluation for details):  ADL's;IADL's    Rehab Potential  Good    OT Frequency  2x / week    OT Duration  12 weeks    OT Treatment/Interventions  Self-care/ADL training;Therapeutic activities;Therapeutic exercise    Clinical Decision Making  Limited treatment options, no task modification necessary    Consulted and Agree with Plan of Care  Patient       Patient will benefit from skilled therapeutic intervention in order to improve the following deficits and impairments:  Abnormal gait, Decreased activity tolerance, Decreased cognition, Decreased balance, Decreased strength, Impaired UE functional use, Decreased coordination  Visit Diagnosis: Muscle weakness (generalized)  Other lack of coordination    Problem List Patient Active Problem List   Diagnosis Date Noted  . Gait disturbance, post-stroke 09/04/2017  . Wernicke's aphasia 09/04/2017  . 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 However, needs to rule out CAA, tumor or CVT (vein of labbe)  09/01/2017  . Constipation 01/06/2017  . DJD (degenerative joint disease) of knee 07/18/2016  . GIB (gastrointestinal bleeding) 05/25/2016  . GI bleed 05/24/2016  . Degenerative joint disease (DJD) of hip 05/08/2016  . 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 Hypertensive heart disease   .  Hemorrhage of gastrointestinal tract 11/13/2015  . Lumbosacral facet joint syndrome 07/26/2015  . Angioedema 07/13/2015  . DM2 (diabetes mellitus, type 2) (HCC) 07/13/2015  . Status post lumbar laminectomy 06/19/2015  . Lumbar radiculopathy 06/19/2015  . DDD (degenerative disc disease), lumbar 05/11/2015  . Facet syndrome, lumbar 05/11/2015  . Lumbar post-laminectomy syndrome 05/11/2015  . Sacroiliac joint disease 05/11/2015  . 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 Neuropathy due to secondary diabetes (HCC) 05/11/2015  . Essential hypertension 01/03/2015  . Coronary atherosclerosis of native coronary artery   . PAF (paroxysmal atrial fibrillation) (HCC) 10/20/2014  . Ischemic colitis (HCC) 10/20/2014  . Diabetes mellitus type 2 with complications (HCC) 10/20/2014  . COPD (chronic obstructive pulmonary disease) (HCC) 10/20/2014  . Hyperlipidemia 10/20/2014  . GERD (gastroesophageal reflux disease) 10/20/2014  . Ingrowing nail, right great toe 04/27/2014  . Internal hemorrhoid, bleeding 07/14/2013    Olegario MessierElaine Ortha Metts, MS, OTR/L 01/06/2018, 11:18 AM  [facility] [address] , [place], [address] Phone: [phone]   Fax:  [phone]  Name: Tomma RakersMary L Meriwether MRN: [id number] Date of Birth: 1941/07/24

## 2018-01-06 NOTE — Therapy (Signed)
[place] MAIN [facility] SERVICES [address] [place], [place], [address] Phone: [phone]   Fax:  [phone]  Physical Therapy Treatment  Patient Details  Name: Yvette Collier MRN: [id number] Date of Birth: 1941/06/13 Referring Provider: BURNS, HARRIETT P   Encounter Date: 01/06/2018  PT End of Session - 01/06/18 1006    Visit Number  20    Number of Visits  41    Date for PT Re-Evaluation  02/03/18    PT Start Time  1000    PT Stop Time  1045    PT Time Calculation (min)  45 min    Equipment Utilized During Treatment  Gait belt    Activity Tolerance  Patient tolerated treatment well;Other (comment) Easily distracted    Behavior During Therapy  [facility] for tasks assessed/performed       Past Medical History:  Diagnosis Date  . Asthma   . Chronic lower back pain   . COPD (chronic obstructive pulmonary disease) ([place])   . Diabetes mellitus without complication ([place])   . Gastrointestinal bleed   . GERD (gastroesophageal reflux disease)   . History of colon polyps   . Hypertension   . Hypertensive heart disease   . IBS (irritable bowel syndrome)   . Ischemic colitis ([place])   . Non-obstructive Coronary Artery Disease    a. 05/2009 Cath [facility]): minimal nonobs dzs.  . Osteoarthritis   . PAF (paroxysmal atrial fibrillation) ([place])    a. 09/2014 during GI illness;  b. CHA2DS2VASc = 5 (not currently on [place] 2/2 h/o GIB/ischemic colitis); c. 09/2014 Echo: EF 60-65%, imparied relaxation, nl RV size/fxn.  . Transient cerebral ischemia due to atrial fibrillation [facility])     Past Surgical History:  Procedure Laterality Date  . ABDOMINAL HYSTERECTOMY    . APPENDECTOMY    . BACK SURGERY     x 3, last 2004.  . CHOLECYSTECTOMY    . COLONOSCOPY WITH PROPOFOL N/A 03/20/2017   Procedure: COLONOSCOPY WITH PROPOFOL;  Surgeon: Jonathon Bellows, MD;  Location: [facility] ENDOSCOPY;  Service: Endoscopy;  Laterality: N/A;  . ESOPHAGOGASTRODUODENOSCOPY (EGD) WITH PROPOFOL N/A  03/20/2017   Procedure: ESOPHAGOGASTRODUODENOSCOPY (EGD) WITH PROPOFOL;  Surgeon: Jonathon Bellows, MD;  Location: [facility] ENDOSCOPY;  Service: Endoscopy;  Laterality: N/A;  . FOOT SURGERY Right   . HEMORRHOID BANDING    . left total hip arthroplasty  2012  . STOMACH SURGERY    . TONSILLECTOMY      There were no vitals filed for this visit.  Subjective Assessment - 01/06/18 1005    Subjective  Patient reports a stumble over L foot but no fall. Patient confused with word choice and was difficult to obtain clear account of what happend with trip.     Pertinent History  Yvette Collier a 77 y.o.right handed femalewith history of hypertension,[name],diabetes mellitus, COPD. Patient lives alone independent prior to admission and works as a Theme park manager. One level home. Admitted 09/01/2017 with altered mental statusright side weaknessas well as aphasia. CT of the head showed a large acute left temporal lobe hematoma approximately 15.9 mL suspect hypertensive hemorrhage.Pt presents today with fluent aphasia.    Limitations  Walking    How long can you sit comfortably?  30 minutes before needing to shift positions    How long can you stand comfortably?  about 30 minutes before needing to sit and rest    How long can you walk comfortably?  states that she has no issues with walking    Patient Stated Goals  "get stronger"    Currently in Pain?  No/denies       Nustep Lvl 4 4 minutes (no charge)    TherEx   Ankle weights: #5        Seated LAQ 2x 15x each leg         standing Marching 15x each leg          Standing hip flexion 10x each leg SUE support           Standing hip extension 10x each leg SUE support          Standing hip abduction 10x each leg SUE support   Resisted DF RTB around feet 10x each leg   Resisted eversion with RTB around feet 10x each leg   Seated adduction squeezing ball 10x for 3 seconds    Ambulate 500 ft with CGA, cues for high  knees requiring demonstration for increased height of knees.     Neuro Re-ed    Airex pad: eyes closed 60 seconds, eyes open marching no LOB    Step over and back over orange hurdle no UE support 15x each leg.    Step side step over and back over orange hurdle no UE support 12 each each leg.       Pt requires frequent visual, verbal and tactile cues in order to complete tasks today. Performance improved when PT performed task with patient.    Pt. response to medical necessity:  Patient will continue to benefit from skilled physical therapy to improve dynamic standing balance and increase strength in order to decrease fall risk.                        PT Education - 01/06/18 1006    Education provided  Yes    Education Details  functional strength and balance    Person(s) Educated  Patient    Methods  Explanation;Demonstration;Verbal cues    Comprehension  Verbalized understanding;Returned demonstration       PT Short Term Goals - 12/09/17 1151      PT SHORT TERM GOAL #1   Title  Pt will improve 5x sit to stand to 16 seconds in order to demonstrate improved LE strength and improved dynamic balance to decrease risk for falls.    Baseline  18 sec, 10/15/17: 21.41 s,  22.48 sec    Time  6    Period  Weeks    Status  On-going      PT SHORT TERM GOAL #2   Title  Pt will improve gait speed to at least 0.8 m/s, demonstrating improved LE strength in order to safely ambulate with decreased risk for fals.    Baseline  0.688 m/s, 10/15/17: 1.0 m/s, , . 62 m/sec    Time  6    Period  Weeks    Status  Achieved      PT SHORT TERM GOAL #3   Title  Pt will improve TUG to at least 16 seconds in order to demonstrate improve LE strength and dynamic balance.    Baseline  19 sec; 10/15/17: 11.23s,     Time  6    Period  Weeks    Status  Achieved      PT SHORT TERM GOAL #4   Title  Pt will improve Berg Balance score to 46/56 in order to demonstarte improved  dynamic  and static balance in order to decrease overall falls risk.    Baseline  41/56; 51/56    Time  6    Period  Weeks    Status  Achieved      PT SHORT TERM GOAL #5   Title  Patient will increase dynamic gait index score to >15/24 as to demonstrate reduced fall risk and improved dynamic gait balance for better safety with community/home ambulation.     Baseline  13/24    Time  2    Period  Weeks    Status  New    Target Date  12/23/17        PT Long Term Goals - 12/09/17 1152      PT LONG TERM GOAL #1   Title  Pt will demonstrate independence with HEP in order to continue LE strengthening and balance interventions at home to manage symptoms and decrease overall falls risk.    Baseline  HEP compliant    Time  12    Period  Weeks    Status  On-going      PT LONG TERM GOAL #2   Title  Pt will improve 5x sit to stand to <15 seconds in order to demonstrate improved LE strength and improved balance in order to decrease falls risk.    Baseline  18 sec; 10/15/17: 21.41s, 22.48    Time  12    Period  Weeks    Status  On-going      PT LONG TERM GOAL #3   Title  Pt will improve gait speed to at least 1.0 m/s in order to demonstrate improved LE strength and balance in order to ambulate with decreased risk for falls.    Baseline  0.688 m/s; 10/15/17: 1.0 m/s     Time  12    Period  Weeks    Status  Achieved      PT LONG TERM GOAL #4   Title  Pt will demonstrate an improved TUG score to < or equal to 14 seconds in order to demonstrate improved LE strength and balance in order to decrease overall falls risk.    Baseline  19 sec; 10/15/17: 11.23s    Time  12    Period  Weeks    Status  Achieved      PT LONG TERM GOAL #5   Title  Pt will demonstrate an improved Berg Balance score to at least 51/56 in order to demonstrate improved dynamic and static balance activities.    Baseline  41/56; 10/15/17: 51/56    Time  12    Period  Weeks    Status  Achieved      Additional Long Term Goals    Additional Long Term Goals  Yes      PT LONG TERM GOAL #6   Title  Pt will improve LE strength to 4+/5 in all limited planes in order to increase funtional abilities and improve gait.    Baseline  Gross strength assessment: 3+/5, L hip ext 2+/5, R hip ext 3-/5; 10/15/17: hip extension 3/5 bilaterally, hip abduction/adduction: 3+/5 on R side, 4-/5 on L side    Time  12    Period  Weeks    Status  On-going      PT LONG TERM GOAL #7   Title  Pt will improve gait speed to at least 1.2 m/s in order to demonstrate improved LE strength and balance in order to ambulate with decreased  risk for falls.    Baseline  0.688 m/s; 10/15/17: 1.0 m/s, .62 /sec 12/18: .67 m/s    Time  8    Period  Weeks    Status  Partially Met      PT LONG TERM GOAL #8   Title  Pt will demonstrate an improved Berg Balance score to at least 54/56 in order to demonstrate improved dynamic and static balance activities.    Baseline  41/56; 10/15/17: 51/56 12/18: 52/56    Time  8    Period  Weeks    Status  On-going      PT LONG TERM GOAL  #9   TITLE  Patient will increase dynamic gait index score to >19/24 as to demonstrate reduced fall risk and improved dynamic gait balance for better safety with community/home ambulation.     Baseline  12/18: 13/24    Time  8    Period  Weeks    Status  New    Target Date  02/03/18            Plan - 01/06/18 1022    Clinical Impression Statement  Patient increasing in functional strength with increased ankle weight utilized with interventions in standing and seated interventions. Patient continues to improve with functional mobility with decreased episodes of  LOB.Patient will continue to benefit from skilled physical therapy to improve dynamic standing balance and increase strength in order to decrease fall risk.    Rehab Potential  Good    PT Frequency  2x / week    PT Duration  8 weeks    PT Treatment/Interventions  Aquatic Therapy;Cryotherapy;Moist Heat;Gait  training;Stair training;Functional mobility training;Therapeutic activities;Therapeutic exercise;Balance training;Neuromuscular re-education;Patient/family education;Manual techniques;Passive range of motion    PT Next Visit Plan  begin LE strength and dynamic balance activities    PT Home Exercise Plan  seated marches, seated hip ABD with red theraband    Consulted and Agree with Plan of Care  Patient       Patient will benefit from skilled therapeutic intervention in order to improve the following deficits and impairments:  Abnormal gait, Decreased coordination, Decreased activity tolerance, Decreased balance, Decreased cognition, Decreased mobility, Decreased safety awareness, Decreased strength, Difficulty walking  Visit Diagnosis: Muscle weakness (generalized)  Other lack of coordination  Unsteadiness on feet     Problem List Patient Active Problem List   Diagnosis Date Noted  . Gait disturbance, post-stroke 09/04/2017  . Wernicke's aphasia 09/04/2017  . 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 However, needs to rule out CAA, tumor or CVT (vein of labbe)  09/01/2017  . Constipation 01/06/2017  . DJD (degenerative joint disease) of knee 07/18/2016  . GIB (gastrointestinal bleeding) 05/25/2016  . GI bleed 05/24/2016  . Degenerative joint disease (DJD) of hip 05/08/2016  . 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 Hypertensive heart disease   . Hemorrhage of gastrointestinal tract 11/13/2015  . Lumbosacral facet joint syndrome 07/26/2015  . Angioedema 07/13/2015  . DM2 (diabetes mellitus, type 2) ([place]) 07/13/2015  . Status post lumbar laminectomy 06/19/2015  . Lumbar radiculopathy 06/19/2015  . DDD (degenerative disc disease), lumbar 05/11/2015  . Facet syndrome, lumbar 05/11/2015  . Lumbar post-laminectomy syndrome 05/11/2015  . Sacroiliac joint disease 05/11/2015  . Spinal stenosis,  lumbar region, with neurogenic claudication 05/11/2015  . Neuropathy due to secondary diabetes ([place]) 05/11/2015  . Essential hypertension 01/03/2015  . Coronary atherosclerosis  of native coronary artery   . PAF (paroxysmal atrial fibrillation) ([place]) 10/20/2014  . Ischemic colitis ([place]) 10/20/2014  . Diabetes mellitus type 2 with complications ([place]) 99/80/6999  . COPD (chronic obstructive pulmonary disease) ([place]) 10/20/2014  . Hyperlipidemia 10/20/2014  . GERD (gastroesophageal reflux disease) 10/20/2014  . Ingrowing nail, right great toe 04/27/2014  . Internal hemorrhoid, bleeding 07/14/2013   Janna Arch, PT, DPT   Janna Arch 01/06/2018, 10:48 AM  [place] MAIN [facility] SERVICES [address] [place], [place], [address] Phone: [phone]   Fax:  [phone]  Name: PAIDEN CARAVEO MRN: [id number] Date of Birth: November 11, 1941

## 2018-01-08 ENCOUNTER — Ambulatory Visit: Payer: Medicare Other | Admitting: Occupational Therapy

## 2018-01-08 ENCOUNTER — Encounter: Payer: Self-pay | Admitting: Occupational Therapy

## 2018-01-08 ENCOUNTER — Ambulatory Visit: Payer: Medicare Other | Admitting: Speech Pathology

## 2018-01-08 ENCOUNTER — Ambulatory Visit: Payer: Medicare Other

## 2018-01-08 DIAGNOSIS — R278 Other lack of coordination: Secondary | ICD-10-CM

## 2018-01-08 DIAGNOSIS — R2681 Unsteadiness on feet: Secondary | ICD-10-CM

## 2018-01-08 DIAGNOSIS — M6281 Muscle weakness (generalized): Secondary | ICD-10-CM

## 2018-01-08 DIAGNOSIS — R4701 Aphasia: Secondary | ICD-10-CM

## 2018-01-08 NOTE — Therapy (Signed)
[place] MAIN [facility] SERVICES [address] [place], [place], [address] Phone: [phone]   Fax:  [phone]  Physical Therapy Treatment  Patient Details  Name: Yvette Collier MRN: [id number] Date of Birth: 1941/06/12 Referring Provider: BURNS, HARRIETT P   Encounter Date: 01/08/2018  PT End of Session - 01/08/18 1022    Visit Number  21    Number of Visits  41    Date for PT Re-Evaluation  02/03/18    PT Start Time  1016    PT Stop Time  1100    PT Time Calculation (min)  44 min    Equipment Utilized During Treatment  Gait belt    Activity Tolerance  Patient tolerated treatment well;Other (comment) Easily distracted    Behavior During Therapy  [facility] for tasks assessed/performed       Past Medical History:  Diagnosis Date  . Asthma   . Chronic lower back pain   . COPD (chronic obstructive pulmonary disease) ([place])   . Diabetes mellitus without complication ([place])   . Gastrointestinal bleed   . GERD (gastroesophageal reflux disease)   . History of colon polyps   . Hypertension   . Hypertensive heart disease   . IBS (irritable bowel syndrome)   . Ischemic colitis ([place])   . Non-obstructive Coronary Artery Disease    a. 05/2009 Cath [facility]): minimal nonobs dzs.  . Osteoarthritis   . PAF (paroxysmal atrial fibrillation) ([place])    a. 09/2014 during GI illness;  b. CHA2DS2VASc = 5 (not currently on [place] 2/2 h/o GIB/ischemic colitis); c. 09/2014 Echo: EF 60-65%, imparied relaxation, nl RV size/fxn.  . Transient cerebral ischemia due to atrial fibrillation [facility])     Past Surgical History:  Procedure Laterality Date  . ABDOMINAL HYSTERECTOMY    . APPENDECTOMY    . BACK SURGERY     x 3, last 2004.  . CHOLECYSTECTOMY    . COLONOSCOPY WITH PROPOFOL N/A 03/20/2017   Procedure: COLONOSCOPY WITH PROPOFOL;  Surgeon: Jonathon Bellows, MD;  Location: [facility] ENDOSCOPY;  Service: Endoscopy;  Laterality: N/A;  . ESOPHAGOGASTRODUODENOSCOPY (EGD) WITH PROPOFOL N/A  03/20/2017   Procedure: ESOPHAGOGASTRODUODENOSCOPY (EGD) WITH PROPOFOL;  Surgeon: Jonathon Bellows, MD;  Location: [facility] ENDOSCOPY;  Service: Endoscopy;  Laterality: N/A;  . FOOT SURGERY Right   . HEMORRHOID BANDING    . left total hip arthroplasty  2012  . STOMACH SURGERY    . TONSILLECTOMY      There were no vitals filed for this visit.  Subjective Assessment - 01/08/18 1020    Subjective  Patient is in pleasant spirits, reports some back pain but is unable to tell number of pain for VAS scale.     Pertinent History  Yvette Collier a 77 y.o.right handed femalewith history of hypertension,[name],diabetes mellitus, COPD. Patient lives alone independent prior to admission and works as a Theme park manager. One level home. Admitted 09/01/2017 with altered mental statusright side weaknessas well as aphasia. CT of the head showed a large acute left temporal lobe hematoma approximately 15.9 mL suspect hypertensive hemorrhage.Pt presents today with fluent aphasia.    Limitations  Walking    How long can you sit comfortably?  30 minutes before needing to shift positions    How long can you stand comfortably?  about 30 minutes before needing to sit and rest    How long can you walk comfortably?  states that she has no issues with  walking    Patient Stated Goals  "get stronger"    Currently in Pain?  Other (Comment) reports low back pain , unable to assess number        Nustep Lvl 5 4 minutes    TherEx   Ankle weights: #5        Seated LAQ 2x 15x each leg         seated Marching 2x 15x each leg         Standing hip flexion 2x 10x each leg SUE support         Standing hip extension 2x 10x each leg SUE support          Standing hip abduction 2x10x each leg SUE support     Seated adduction squeezing ball 2x 10x for 3 seconds      Neuro Re-ed  Walking with horizontal head turns 2x100 ft, requiring max verbal cueing and CGA, vertical head turns 2x 100 ft with  max verbal cueing and CGA, occasional LOB with upward gaze.   Stand without support performing dynamic leg motions without LOB, occasional excessive trunk flexion due to fatigue   Tandem walking a line CGA 8x 10 ft       Pt requires frequent visual, verbal and tactile cues in order to complete tasks today. Performance improved when PT performed task with patient.    Pt. response to medical necessity:  Patient will continue to benefit from skilled physical therapy to improve dynamic standing balance and increase strength in order to decrease fall risk.                     PT Education - 01/08/18 1022    Education provided  Yes    Education Details  functional strength and balance for improved mobility     Person(s) Educated  Patient    Methods  Explanation;Demonstration;Verbal cues    Comprehension  Verbalized understanding;Returned demonstration       PT Short Term Goals - 12/09/17 1151      PT SHORT TERM GOAL #1   Title  Pt will improve 5x sit to stand to 16 seconds in order to demonstrate improved LE strength and improved dynamic balance to decrease risk for falls.    Baseline  18 sec, 10/15/17: 21.41 s,  22.48 sec    Time  6    Period  Weeks    Status  On-going      PT SHORT TERM GOAL #2   Title  Pt will improve gait speed to at least 0.8 m/s, demonstrating improved LE strength in order to safely ambulate with decreased risk for fals.    Baseline  0.688 m/s, 10/15/17: 1.0 m/s, , . 62 m/sec    Time  6    Period  Weeks    Status  Achieved      PT SHORT TERM GOAL #3   Title  Pt will improve TUG to at least 16 seconds in order to demonstrate improve LE strength and dynamic balance.    Baseline  19 sec; 10/15/17: 11.23s,     Time  6    Period  Weeks    Status  Achieved      PT SHORT TERM GOAL #4   Title  Pt will improve Berg Balance score to 46/56 in order to demonstarte improved dynamic and static balance in order to decrease overall falls risk.     Baseline  41/56; 51/56    Time  6    Period  Weeks    Status  Achieved      PT SHORT TERM GOAL #5   Title  Patient will increase dynamic gait index score to >15/24 as to demonstrate reduced fall risk and improved dynamic gait balance for better safety with community/home ambulation.     Baseline  13/24    Time  2    Period  Weeks    Status  New    Target Date  12/23/17        PT Long Term Goals - 12/09/17 1152      PT LONG TERM GOAL #1   Title  Pt will demonstrate independence with HEP in order to continue LE strengthening and balance interventions at home to manage symptoms and decrease overall falls risk.    Baseline  HEP compliant    Time  12    Period  Weeks    Status  On-going      PT LONG TERM GOAL #2   Title  Pt will improve 5x sit to stand to <15 seconds in order to demonstrate improved LE strength and improved balance in order to decrease falls risk.    Baseline  18 sec; 10/15/17: 21.41s, 22.48    Time  12    Period  Weeks    Status  On-going      PT LONG TERM GOAL #3   Title  Pt will improve gait speed to at least 1.0 m/s in order to demonstrate improved LE strength and balance in order to ambulate with decreased risk for falls.    Baseline  0.688 m/s; 10/15/17: 1.0 m/s     Time  12    Period  Weeks    Status  Achieved      PT LONG TERM GOAL #4   Title  Pt will demonstrate an improved TUG score to < or equal to 14 seconds in order to demonstrate improved LE strength and balance in order to decrease overall falls risk.    Baseline  19 sec; 10/15/17: 11.23s    Time  12    Period  Weeks    Status  Achieved      PT LONG TERM GOAL #5   Title  Pt will demonstrate an improved Berg Balance score to at least 51/56 in order to demonstrate improved dynamic and static balance activities.    Baseline  41/56; 10/15/17: 51/56    Time  12    Period  Weeks    Status  Achieved      Additional Long Term Goals   Additional Long Term Goals  Yes      PT LONG TERM GOAL #6    Title  Pt will improve LE strength to 4+/5 in all limited planes in order to increase funtional abilities and improve gait.    Baseline  Gross strength assessment: 3+/5, L hip ext 2+/5, R hip ext 3-/5; 10/15/17: hip extension 3/5 bilaterally, hip abduction/adduction: 3+/5 on R side, 4-/5 on L side    Time  12    Period  Weeks    Status  On-going      PT LONG TERM GOAL #7   Title  Pt will improve gait speed to at least 1.2 m/s in order to demonstrate improved LE strength and balance in order to ambulate with decreased risk for falls.    Baseline  0.688 m/s; 10/15/17: 1.0 m/s, .62 /sec 12/18: .67 m/s    Time  8  Period  Weeks    Status  Partially Met      PT LONG TERM GOAL #8   Title  Pt will demonstrate an improved Berg Balance score to at least 54/56 in order to demonstrate improved dynamic and static balance activities.    Baseline  41/56; 10/15/17: 51/56 12/18: 52/56    Time  8    Period  Weeks    Status  On-going      PT LONG TERM GOAL  #9   TITLE  Patient will increase dynamic gait index score to >19/24 as to demonstrate reduced fall risk and improved dynamic gait balance for better safety with community/home ambulation.     Baseline  12/18: 13/24    Time  8    Period  Weeks    Status  New    Target Date  02/03/18            Plan - 01/08/18 1041    Clinical Impression Statement  Patient sets increased for strengthening of LE's due to improved functional strength as demonstrated in ambulatory mechanics and transfer fluidity.  Patient will continue to benefit from skilled physical therapy to improve dynamic standing balance and increase strength in order to decrease fall risk.    Rehab Potential  Good    PT Frequency  2x / week    PT Duration  8 weeks    PT Treatment/Interventions  Aquatic Therapy;Cryotherapy;Moist Heat;Gait training;Stair training;Functional mobility training;Therapeutic activities;Therapeutic exercise;Balance training;Neuromuscular  re-education;Patient/family education;Manual techniques;Passive range of motion    PT Next Visit Plan  begin LE strength and dynamic balance activities    PT Home Exercise Plan  seated marches, seated hip ABD with red theraband    Consulted and Agree with Plan of Care  Patient       Patient will benefit from skilled therapeutic intervention in order to improve the following deficits and impairments:  Abnormal gait, Decreased coordination, Decreased activity tolerance, Decreased balance, Decreased cognition, Decreased mobility, Decreased safety awareness, Decreased strength, Difficulty walking  Visit Diagnosis: Muscle weakness (generalized)  Other lack of coordination  Unsteadiness on feet     Problem List Patient Active Problem List   Diagnosis Date Noted  . Gait disturbance, post-stroke 09/04/2017  . Wernicke's aphasia 09/04/2017  . 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 However, needs to rule out CAA, tumor or CVT (vein of labbe)  09/01/2017  . Constipation 01/06/2017  . DJD (degenerative joint disease) of knee 07/18/2016  . GIB (gastrointestinal bleeding) 05/25/2016  . GI bleed 05/24/2016  . Degenerative joint disease (DJD) of hip 05/08/2016  . 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 Hypertensive heart disease   . Hemorrhage of gastrointestinal tract 11/13/2015  . Lumbosacral facet joint syndrome 07/26/2015  . Angioedema 07/13/2015  . DM2 (diabetes mellitus, type 2) ([place]) 07/13/2015  . Status post lumbar laminectomy 06/19/2015  . Lumbar radiculopathy 06/19/2015  . DDD (degenerative disc disease), lumbar 05/11/2015  . Facet syndrome, lumbar 05/11/2015  . Lumbar post-laminectomy syndrome 05/11/2015  . Sacroiliac joint disease 05/11/2015  . 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 Neuropathy due to secondary diabetes ([place]) 05/11/2015  . Essential  hypertension 01/03/2015  . Coronary atherosclerosis of native coronary artery   . PAF (paroxysmal atrial fibrillation) ([place]) 10/20/2014  . Ischemic colitis ([place]) 10/20/2014  . Diabetes mellitus type 2 with complications ([place]) 90/30/0923  . COPD (chronic obstructive pulmonary  disease) ([place]) 10/20/2014  . Hyperlipidemia 10/20/2014  . GERD (gastroesophageal reflux disease) 10/20/2014  . Ingrowing nail, right great toe 04/27/2014  . Internal hemorrhoid, bleeding 07/14/2013   Janna Arch, PT, DPT   Janna Arch 01/08/2018, 11:01 AM  [place] MAIN [facility] SERVICES [address] [place], [place], [address] Phone: [phone]   Fax:  [phone]  Name: Yvette Collier MRN: [id number] Date of Birth: 1941/10/10

## 2018-01-08 NOTE — Therapy (Signed)
[place] [facility] [address] [place], [place], [address] Phone: [phone]   Fax:  [phone]  Occupational Therapy Treatment  Patient Details  Name: Yvette RakersMary L Collier MRN: [id number] Date of Birth: Apr 02, 1941 Referring Provider: BURNS, HARRIETT P   Encounter Date: 01/08/2018  OT End of Session - 01/08/18 1104    Visit Number  20    Number of Visits  48    Date for OT Re-Evaluation  03/03/18    OT Start Time  1100    OT Stop Time  1145    OT Time Calculation (min)  45 min    Activity Tolerance  Patient tolerated treatment well       Past Medical History:  Diagnosis Date  . Asthma   . Chronic lower back pain   . COPD (chronic obstructive pulmonary disease) (HCC)   . Diabetes mellitus without complication (HCC)   . Gastrointestinal bleed   . GERD (gastroesophageal reflux disease)   . History of colon polyps   . Hypertension   . Hypertensive heart disease   . IBS (irritable bowel syndrome)   . Ischemic colitis (HCC)   . Non-obstructive Coronary Artery Disease    a. 05/2009 Cath [facility]): minimal nonobs dzs.  . Osteoarthritis   . PAF (paroxysmal atrial fibrillation) (HCC)    a. 09/2014 during GI illness;  b. CHA2DS2VASc = 5 (not currently on [place] 2/2 h/o GIB/ischemic colitis); c. 09/2014 Echo: EF 60-65%, imparied relaxation, nl RV size/fxn.  . Transient cerebral ischemia due to atrial fibrillation [facility](HCC)     Past Surgical History:  Procedure Laterality Date  . ABDOMINAL HYSTERECTOMY    . APPENDECTOMY    . BACK SURGERY     x 3, last 2004.  . CHOLECYSTECTOMY    . COLONOSCOPY WITH PROPOFOL N/A 03/20/2017   Procedure: COLONOSCOPY WITH PROPOFOL;  Surgeon: Wyline MoodKiran Anna, MD;  [facility];  Service: Endoscopy;  Laterality: N/A;  . ESOPHAGOGASTRODUODENOSCOPY (EGD) WITH PROPOFOL N/A 03/20/2017   Procedure: ESOPHAGOGASTRODUODENOSCOPY (EGD) WITH PROPOFOL;  Surgeon: Wyline MoodKiran Anna, MD;  Location: [facility] ENDOSCOPY;  Service:  Endoscopy;  Laterality: N/A;  . FOOT SURGERY Right   . HEMORRHOID BANDING    . left total hip arthroplasty  2012  . STOMACH SURGERY    . TONSILLECTOMY      There were no vitals filed for this visit.  Subjective Assessment - 01/08/18 1103    Subjective   Pt. reports no pain today    Patient is accompained by:  Family member    Pertinent History  Pt. is a 77 y.o. female who suffered a Left temporla Lobe Hemorrhage secondary to Hypertensive Crisis, Moderate ICH, Left Frontal Trace SDH, and Traumatic ICH secondary to a fall. Pt. PMHX includes: HTN, DM with peripheral Neuropathy, Acute Kidney Injury, Hyperlipdidemia, and history of a GI Bleed. Pt. [facility], was discharged, and now is ready for outpatient rehab services.       OT TREATMENT    Neuro muscular re-education:  [facility]. [facility] tasks using the Grooved pegboard. Pt. worked on grasping the grooved pegs from a horizontal position, and moving the pegs to a vertical position in the hand to prepare for placing them in the grooved slot. Pt. Worked on grasping, and storing the pegs.  Therapeutic Exercise:  Pt. performed 2# dowel ex. For UE strengthening secondary to weakness. Bilateral shoulder flexion, chest press, circular patterns, and elbow flexion/extension were performed. 3# dumbbell ex.  for elbow flexion and extension,  2# for forearm supination/pronation, wrist flexion/extension, and radial deviation. Pt. requires rest breaks and verbal cues for proper technique. Pt. Requires verbal cues, and visual demonstration.                       OT Education - 01/08/18 1103    Education provided  Yes    Education Details  strengthening, coordination    Person(s) Educated  Patient    Methods  Explanation;Demonstration;Verbal cues    Comprehension  Verbalized understanding;Returned demonstration         OT Long Term Goals - 12/09/17 0908      OT LONG TERM GOAL #1   Title  Pt. will  be independent with basic ADL care needs.    Baseline  Eval: Pt. requires assist, 11/13: Pt. requires minA UE, ModA LE, 12/09/2018: Pt. conitnues to require assist.    Time  12    Period  Weeks    Status  On-going    Target Date  03/03/18      OT LONG TERM GOAL #2   Title  Pt. will be independent with light meal preparation.    Baseline  Eval: Pt. is unable, 11/04/2017: Pt. is able to assist with light cold meal prep, ahowver requires assist,. 12/09/2018: Pt. continues to require assist    Time  12    Period  Weeks    Status  On-going    Target Date  03/03/18      OT LONG TERM GOAL #3   Title  Pt. will be independent with tub/shower transfers    Baseline  Eval: Pt. requires assist, 11/04/2017: pt. conitnues to require assist using a shower seat.    Time  12    Period  Weeks    Status  On-going    Target Date  03/03/18      OT LONG TERM GOAL #4   Title  Pt. will increase BUE strength by 2 mm grades to assist with ADLs.    Baseline  Eval: BUE strength 4/5 overall. 12/09/2018: BUEs: shoulder flexion, and abduction 4/5, elbow flexion/extension: 4+/5    Time  12    Period  Weeks    Status  On-going    Target Date  03/03/18      OT LONG TERM GOAL #5   Title  Pt. will improve right [facility] skills to be able to assist with buttoning, and zipping.     Baseline  Eval: impaired, 11/04/2017:  Pt. continues to require work on improving [facility] skills for buttnoning, and zipping clothing. 12/08/2017: Pt. continues to require assist    Time  12    Period  Weeks    Status  On-going    Target Date  03/03/18      OT LONG TERM GOAL #6   Title  Pt. will improve right grip strength to be able to open containers.    Baseline  Eval: Limited. 11/04/2017: improving, 12/09/2017: Pt. continues to have difficulty    Time  12    Period  Weeks    Status  On-going    Target Date  03/03/18      OT LONG TERM GOAL #7   Title  Pt. will identify potential safety hazards accurately during ADLs, and IADLs with  100% accuracy.    Baseline  Eval: limited 11/04/2017: Conitnues to be impaired, 12/09/2017: Pt. requires frequent cues.    Time  12  Period  Weeks    Status  On-going    Target Date  03/03/18      OT LONG TERM GOAL #8   Title  Pt. will demonstrate cognitive compensatory techniques 100% of the time with minimal cues.    Baseline  Eval: limite11/13/2018: conitnues to be limited. 12/09/2018: Pt. continues to require assist.    Time  12    Period  Weeks    Target Date  03/03/18            Plan - 01/08/18 1104    Clinical Impression Statement  Pt. reported seeing pintos on the vine. Pt. reports the exercises feel good today. Pt. requires visual cues, verbal cues, and cognitive assist for movement pattern, and UE functioning. Pt. continues to work on improving ADL, and IADL functioning.    Occupational performance deficits (Please refer to evaluation for details):  ADL's;IADL's    Rehab Potential  Good    OT Frequency  2x / week    OT Duration  12 weeks    OT Treatment/Interventions  Self-care/ADL training;Therapeutic activities;Therapeutic exercise    Clinical Decision Making  Limited treatment options, no task modification necessary    Consulted and Agree with Plan of Care  Patient       Patient will benefit from skilled therapeutic intervention in order to improve the following deficits and impairments:  Abnormal gait, Decreased activity tolerance, Decreased cognition, Decreased balance, Decreased strength, Impaired UE functional use, Decreased coordination  Visit Diagnosis: Muscle weakness (generalized)    Problem List Patient Active Problem List   Diagnosis Date Noted  . Gait disturbance, post-stroke 09/04/2017  . Wernicke's aphasia 09/04/2017  . 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 However, needs to rule out CAA, tumor or CVT (vein of labbe)  09/01/2017  . Constipation  01/06/2017  . DJD (degenerative joint disease) of knee 07/18/2016  . GIB (gastrointestinal bleeding) 05/25/2016  . GI bleed 05/24/2016  . Degenerative joint disease (DJD) of hip 05/08/2016  . 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 Hypertensive heart disease   . Hemorrhage of gastrointestinal tract 11/13/2015  . Lumbosacral facet joint syndrome 07/26/2015  . Angioedema 07/13/2015  . DM2 (diabetes mellitus, type 2) (HCC) 07/13/2015  . Status post lumbar laminectomy 06/19/2015  . Lumbar radiculopathy 06/19/2015  . DDD (degenerative disc disease), lumbar 05/11/2015  . Facet syndrome, lumbar 05/11/2015  . Lumbar post-laminectomy syndrome 05/11/2015  . Sacroiliac joint disease 05/11/2015  . 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 Neuropathy due to secondary diabetes (HCC) 05/11/2015  . Essential hypertension 01/03/2015  . Coronary atherosclerosis of native coronary artery   . PAF (paroxysmal atrial fibrillation) (HCC) 10/20/2014  . Ischemic colitis (HCC) 10/20/2014  . Diabetes mellitus type 2 with complications (HCC) 10/20/2014  . COPD (chronic obstructive pulmonary disease) (HCC) 10/20/2014  . Hyperlipidemia 10/20/2014  . GERD (gastroesophageal reflux disease) 10/20/2014  . Ingrowing nail, right great toe 04/27/2014  . Internal hemorrhoid, bleeding 07/14/2013    Olegario Messier, MS, OTR/L 01/08/2018, 11:20 AM  [place] [facility] MAIN [facility] SERVICES [address] [place], [place], [address] Phone: [phone]   Fax:  [phone]  Name: ROXIE KREEGER MRN: [id number] Date of Birth: [birth year]

## 2018-01-09 ENCOUNTER — Encounter: Payer: Self-pay | Admitting: Speech Pathology

## 2018-01-09 NOTE — Therapy (Signed)
[place] MAIN [facility] SERVICES [address] [place], [place], [address] Phone: [phone]   Fax:  [phone]  Speech Language Pathology Treatment  Patient Details  Name: Yvette Collier MRN: [id number] Date of Birth: 17-Aug-1941 Referring Provider: Dr. Letta Pate   Encounter Date: 01/08/2018  End of Session - 01/09/18 0940    Visit Number  17    Number of Visits  25    Date for SLP Re-Evaluation  01/19/18    SLP Start Time  0900    SLP Stop Time   0951    SLP Time Calculation (min)  51 min    Activity Tolerance  Patient tolerated treatment well       Past Medical History:  Diagnosis Date  . Asthma   . Chronic lower back pain   . COPD (chronic obstructive pulmonary disease) ([place])   . Diabetes mellitus without complication ([place])   . Gastrointestinal bleed   . GERD (gastroesophageal reflux disease)   . History of colon polyps   . Hypertension   . Hypertensive heart disease   . IBS (irritable bowel syndrome)   . Ischemic colitis ([place])   . Non-obstructive Coronary Artery Disease    a. 05/2009 Cath [facility]): minimal nonobs dzs.  . Osteoarthritis   . PAF (paroxysmal atrial fibrillation) ([place])    a. 09/2014 during GI illness;  b. CHA2DS2VASc = 5 (not currently on [place] 2/2 h/o GIB/ischemic colitis); c. 09/2014 Echo: EF 60-65%, imparied relaxation, nl RV size/fxn.  . Transient cerebral ischemia due to atrial fibrillation [facility])     Past Surgical History:  Procedure Laterality Date  . ABDOMINAL HYSTERECTOMY    . APPENDECTOMY    . BACK SURGERY     x 3, last 2004.  . CHOLECYSTECTOMY    . COLONOSCOPY WITH PROPOFOL N/A 03/20/2017   Procedure: COLONOSCOPY WITH PROPOFOL;  Surgeon: Jonathon Bellows, MD;  Location: [facility] ENDOSCOPY;  Service: Endoscopy;  Laterality: N/A;  . ESOPHAGOGASTRODUODENOSCOPY (EGD) WITH PROPOFOL N/A 03/20/2017   Procedure: ESOPHAGOGASTRODUODENOSCOPY (EGD) WITH PROPOFOL;  Surgeon: Jonathon Bellows, MD;  Location: [facility] ENDOSCOPY;  Service:  Endoscopy;  Laterality: N/A;  . FOOT SURGERY Right   . HEMORRHOID BANDING    . left total hip arthroplasty  2012  . STOMACH SURGERY    . TONSILLECTOMY      There were no vitals filed for this visit.  Subjective Assessment - 01/09/18 0936    Subjective  The patient was able to correct a paraphasia independently            ADULT SLP TREATMENT - 01/09/18 0001      General Information   Behavior/Cognition  Alert;Cooperative;Pleasant mood;Requires cueing    HPI  Patient is a 77 y/o woman with left temproal lobe hemorrhage on 09/01/17.      Treatment Provided   Treatment provided  Cognitive-Linquistic      Cognitive-Linquistic Treatment   Treatment focused on  Aphasia    Skilled Treatment  VERBAL EXPRESSION: Read 3 word phrases independently with 85% accuracy.  Name pictured object given written phrase completion cue with 30% accuracy, improves to 50% given phonemic cue and 95% given written word.  Name common object given written label with 80% accuracy. COMPREHENSION:  Complete 3 unit processing tasks with 75% accuracy given repetition and emphasis.  Identify object (f=10) with 100% accuracy with no repetition of stimulus.  Follow 2-unit commands with visual stimuli with 50%, no cues.      Assessment / Recommendations /  Plan   Plan  Continue with current plan of care      Progression Toward Goals   Progression toward goals  Progressing toward goals       SLP Education - 01/09/18 0940    Education provided  Yes    Education Details  Slow down, listen to yourself    Person(s) Educated  Patient    Methods  Explanation    Comprehension  Verbalized understanding         SLP Long Term Goals - 11/19/17 1138      SLP LONG TERM GOAL #1   Title  Patient will complete 2 unit processing tasks with 80% accuracy without the need of repetition of task instructions or significant delays in responding.    Status  Achieved      SLP LONG TERM GOAL #2   Title  Patient will complete  3 unit processing tasks with 80% accuracy without the need of repetition of task instructions or significant delays in responding.    Status  Partially Met      SLP LONG TERM GOAL #3   Title  Patient will name common objects with 50% accuracy.    Status  Not Met      SLP LONG TERM GOAL #4   Title  Patient will name common objects with 80% accuracy.    Status  Not Met      SLP LONG TERM GOAL #5   Title  Patient will generate meaningful phrase to complete simple/concrete linguistic task with 80% accuracy.    Status  Not Met      Additional Long Term Goals   Additional Long Term Goals  Yes      SLP LONG TERM GOAL #6   Title  Patient will read aloud and follow written commands with 80% accuracy    Time  8    Period  Weeks    Status  New    Target Date  01/19/18      SLP LONG TERM GOAL #7   Title  Patient will name pictured object given written phrase completion stimulus with 80% accuracy.    Time  8    Period  Weeks    Status  New    Target Date  01/19/18       Plan - 01/09/18 0941    Clinical Impression Statement  The patient is generating more intelligible and appropriate speech as her auditory comprehension is improving.  The patient continues to improve in overall communicative effectiveness.  She continues to display phonemic paraphasias, neologisms, and perseverations in open-ended verbal expression tasks.      Speech Therapy Frequency  2x / week    Duration  Other (comment)    Treatment/Interventions  Language facilitation;Multimodal communcation approach;SLP instruction and feedback;Patient/family education    Potential to Achieve Goals  Good    Potential Considerations  Ability to learn/carryover information;Co-morbidities;Cooperation/participation level;Medical prognosis;Pain level;Previous level of function;Severity of impairments;Family/community support    SLP Home Exercise Plan  read aloud 3- and 4-letter words    Consulted and Agree with Plan of Care  Patient        Patient will benefit from skilled therapeutic intervention in order to improve the following deficits and impairments:   Aphasia    Problem List Patient Active Problem List   Diagnosis Date Noted  . Gait disturbance, post-stroke 09/04/2017  . Wernicke's aphasia 09/04/2017  . Left temporal lobe hemorrhage (HCC) -  left temporal moderate ICH and left  frontal trace SDH, concerning for traumatic ICH due to fall. However, needs to rule out CAA, tumor or CVT (vein of labbe)  09/01/2017  . Constipation 01/06/2017  . DJD (degenerative joint disease) of knee 07/18/2016  . GIB (gastrointestinal bleeding) 05/25/2016  . GI bleed 05/24/2016  . Degenerative joint disease (DJD) of hip 05/08/2016  . 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 Hypertensive heart disease   . Hemorrhage of gastrointestinal tract 11/13/2015  . Lumbosacral facet joint syndrome 07/26/2015  . Angioedema 07/13/2015  . DM2 (diabetes mellitus, type 2) ([place]) 07/13/2015  . Status post lumbar laminectomy 06/19/2015  . Lumbar radiculopathy 06/19/2015  . DDD (degenerative disc disease), lumbar 05/11/2015  . Facet syndrome, lumbar 05/11/2015  . Lumbar post-laminectomy syndrome 05/11/2015  . Sacroiliac joint disease 05/11/2015  . 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 Neuropathy due to secondary diabetes ([place]) 05/11/2015  . Essential hypertension 01/03/2015  . Coronary atherosclerosis of native coronary artery   . PAF (paroxysmal atrial fibrillation) ([place]) 10/20/2014  . Ischemic colitis ([place]) 10/20/2014  . Diabetes mellitus type 2 with complications ([place]) 86/75/1982  . COPD (chronic obstructive pulmonary disease) ([place]) 10/20/2014  . Hyperlipidemia 10/20/2014  . GERD (gastroesophageal reflux disease) 10/20/2014  . Ingrowing nail, right great toe 04/27/2014  . Internal hemorrhoid, bleeding 07/14/2013   Leroy Sea, MS/CCC- SLP   Lou Miner 01/09/2018, 9:41 AM  [place] MAIN [facility] SERVICES [address] [place], [place], [address] Phone: [phone]   Fax:  [phone]   Name: Yvette Collier MRN: [id number] Date of Birth: 1941/07/30

## 2018-01-13 ENCOUNTER — Encounter: Payer: Self-pay | Admitting: Occupational Therapy

## 2018-01-13 ENCOUNTER — Encounter: Payer: Self-pay | Admitting: Speech Pathology

## 2018-01-13 ENCOUNTER — Ambulatory Visit: Payer: Medicare Other

## 2018-01-13 ENCOUNTER — Ambulatory Visit: Payer: Medicare Other | Admitting: Speech Pathology

## 2018-01-13 ENCOUNTER — Other Ambulatory Visit: Payer: Self-pay

## 2018-01-13 ENCOUNTER — Ambulatory Visit: Payer: Medicare Other | Admitting: Occupational Therapy

## 2018-01-13 DIAGNOSIS — M6281 Muscle weakness (generalized): Secondary | ICD-10-CM

## 2018-01-13 DIAGNOSIS — R4701 Aphasia: Secondary | ICD-10-CM

## 2018-01-13 DIAGNOSIS — I69315 Cognitive social or emotional deficit following cerebral infarction: Secondary | ICD-10-CM

## 2018-01-13 DIAGNOSIS — R2681 Unsteadiness on feet: Secondary | ICD-10-CM

## 2018-01-13 DIAGNOSIS — R278 Other lack of coordination: Secondary | ICD-10-CM

## 2018-01-13 NOTE — Therapy (Signed)
[place] MAIN [facility] SERVICES [address] [place], [place], [address] Phone: [phone]   Fax:  [phone]  Speech Language Pathology Treatment  Patient Details  Name: Yvette Collier MRN: [id number] Date of Birth: 1941/01/09 Referring Provider: Dr. Letta Pate   Encounter Date: 01/13/2018  End of Session - 01/13/18 1237    Visit Number  18    Number of Visits  25    Date for SLP Re-Evaluation  01/19/18    SLP Start Time  0905    SLP Stop Time   0957    SLP Time Calculation (min)  52 min    Activity Tolerance  Patient tolerated treatment well       Past Medical History:  Diagnosis Date  . Asthma   . Chronic lower back pain   . COPD (chronic obstructive pulmonary disease) ([place])   . Diabetes mellitus without complication ([place])   . Gastrointestinal bleed   . GERD (gastroesophageal reflux disease)   . History of colon polyps   . Hypertension   . Hypertensive heart disease   . IBS (irritable bowel syndrome)   . Ischemic colitis ([place])   . Non-obstructive Coronary Artery Disease    a. 05/2009 Cath [facility]): minimal nonobs dzs.  . Osteoarthritis   . PAF (paroxysmal atrial fibrillation) ([place])    a. 09/2014 during GI illness;  b. CHA2DS2VASc = 5 (not currently on [place] 2/2 h/o GIB/ischemic colitis); c. 09/2014 Echo: EF 60-65%, imparied relaxation, nl RV size/fxn.  . Transient cerebral ischemia due to atrial fibrillation [facility])     Past Surgical History:  Procedure Laterality Date  . ABDOMINAL HYSTERECTOMY    . APPENDECTOMY    . BACK SURGERY     x 3, last 2004.  . CHOLECYSTECTOMY    . COLONOSCOPY WITH PROPOFOL N/A 03/20/2017   Procedure: COLONOSCOPY WITH PROPOFOL;  Surgeon: Jonathon Bellows, MD;  Location: [facility] ENDOSCOPY;  Service: Endoscopy;  Laterality: N/A;  . ESOPHAGOGASTRODUODENOSCOPY (EGD) WITH PROPOFOL N/A 03/20/2017   Procedure: ESOPHAGOGASTRODUODENOSCOPY (EGD) WITH PROPOFOL;  Surgeon: Jonathon Bellows, MD;  Location: [facility] ENDOSCOPY;  Service:  Endoscopy;  Laterality: N/A;  . FOOT SURGERY Right   . HEMORRHOID BANDING    . left total hip arthroplasty  2012  . STOMACH SURGERY    . TONSILLECTOMY      There were no vitals filed for this visit.  Subjective Assessment - 01/13/18 1236    Subjective  Today the patient was more perseverative and was not able to benefit from written cues as well.              ADULT SLP TREATMENT - 01/13/18 0001      General Information   Behavior/Cognition  Alert;Cooperative;Pleasant mood;Requires cueing    HPI  Patient is a 77 y/o woman with left temproal lobe hemorrhage on 09/01/17.      Treatment Provided   Treatment provided  Cognitive-Linquistic      Cognitive-Linquistic Treatment   Treatment focused on  Aphasia    Skilled Treatment  VERBAL EXPRESSION: Read 3 word phrases independently with 85% accuracy.  Name pictured object given written phrase completion cue with 30% accuracy, improves to 50% given phonemic cue and 75% given written word.  Name common object given written label with 70% accuracy. COMPREHENSION:  Complete 3 unit processing tasks with 75% accuracy given repetition and emphasis.  Identify object (f=10) with 100% accuracy with no repetition of stimulus.  Follow 2-unit commands with visual stimuli with 50%, no  cues.      Assessment / Recommendations / Plan   Plan  Continue with current plan of care      Progression Toward Goals   Progression toward goals  Progressing toward goals       SLP Education - 01/13/18 1236    Education provided  Yes    Education Details  Slow down and listen    Person(s) Educated  Patient    Methods  Explanation    Comprehension  Verbalized understanding         SLP Long Term Goals - 11/19/17 1138      SLP LONG TERM GOAL #1   Title  Patient will complete 2 unit processing tasks with 80% accuracy without the need of repetition of task instructions or significant delays in responding.    Status  Achieved      SLP LONG TERM GOAL #2    Title  Patient will complete 3 unit processing tasks with 80% accuracy without the need of repetition of task instructions or significant delays in responding.    Status  Partially Met      SLP LONG TERM GOAL #3   Title  Patient will name common objects with 50% accuracy.    Status  Not Met      SLP LONG TERM GOAL #4   Title  Patient will name common objects with 80% accuracy.    Status  Not Met      SLP LONG TERM GOAL #5   Title  Patient will generate meaningful phrase to complete simple/concrete linguistic task with 80% accuracy.    Status  Not Met      Additional Long Term Goals   Additional Long Term Goals  Yes      SLP LONG TERM GOAL #6   Title  Patient will read aloud and follow written commands with 80% accuracy    Time  8    Period  Weeks    Status  New    Target Date  01/19/18      SLP LONG TERM GOAL #7   Title  Patient will name pictured object given written phrase completion stimulus with 80% accuracy.    Time  8    Period  Weeks    Status  New    Target Date  01/19/18       Plan - 01/13/18 1237    Clinical Impression Statement  In general, the patient is generating more intelligible and appropriate speech as her auditory comprehension is improving.  The patient continues to improve in overall communicative effectiveness.  Today the patient was more perseverative and was not able to benefit from written cues as well.  She continues to display phonemic paraphasias, neologisms, and perseverations in open-ended verbal expression tasks.      Speech Therapy Frequency  2x / week    Treatment/Interventions  Language facilitation;Multimodal communcation approach;SLP instruction and feedback;Patient/family education    Potential to Achieve Goals  Good    Potential Considerations  Ability to learn/carryover information;Co-morbidities;Cooperation/participation level;Medical prognosis;Pain level;Previous level of function;Severity of impairments;Family/community support    SLP  Home Exercise Plan  read aloud 3- and 4-letter words    Consulted and Agree with Plan of Care  Patient       Patient will benefit from skilled therapeutic intervention in order to improve the following deficits and impairments:   Aphasia    Problem List Patient Active Problem List   Diagnosis Date Noted  . Gait disturbance,  post-stroke 09/04/2017  . Wernicke's aphasia 09/04/2017  . 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 However, needs to rule out CAA, tumor or CVT (vein of labbe)  09/01/2017  . Constipation 01/06/2017  . DJD (degenerative joint disease) of knee 07/18/2016  . GIB (gastrointestinal bleeding) 05/25/2016  . GI bleed 05/24/2016  . Degenerative joint disease (DJD) of hip 05/08/2016  . 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 Hypertensive heart disease   . Hemorrhage of gastrointestinal tract 11/13/2015  . Lumbosacral facet joint syndrome 07/26/2015  . Angioedema 07/13/2015  . DM2 (diabetes mellitus, type 2) ([place]) 07/13/2015  . Status post lumbar laminectomy 06/19/2015  . Lumbar radiculopathy 06/19/2015  . DDD (degenerative disc disease), lumbar 05/11/2015  . Facet syndrome, lumbar 05/11/2015  . Lumbar post-laminectomy syndrome 05/11/2015  . Sacroiliac joint disease 05/11/2015  . 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 Neuropathy due to secondary diabetes ([place]) 05/11/2015  . Essential hypertension 01/03/2015  . Coronary atherosclerosis of native coronary artery   . PAF (paroxysmal atrial fibrillation) ([place]) 10/20/2014  . Ischemic colitis ([place]) 10/20/2014  . Diabetes mellitus type 2 with complications ([place]) 68/07/8109  . COPD (chronic obstructive pulmonary disease) ([place]) 10/20/2014  . Hyperlipidemia 10/20/2014  . GERD (gastroesophageal reflux disease) 10/20/2014  . Ingrowing nail, right great toe 04/27/2014  . Internal hemorrhoid, bleeding  07/14/2013   Leroy Sea, MS/CCC- SLP  Yvette Collier 01/13/2018, 12:38 PM  [place] MAIN [facility] SERVICES [address] [place], [place], [address] Phone: [phone]   Fax:  [phone]   Name: Yvette Collier MRN: [id number] Date of Birth: Nov 25, 1941

## 2018-01-13 NOTE — Therapy (Signed)
[place] MAIN [facility] SERVICES [address] [place], [place], [address] Phone: [phone]   Fax:  [phone]  Physical Therapy Treatment  Patient Details  Name: Yvette Collier MRN: [id number] Date of Birth: [birth year] Referring Provider: BURNS, HARRIETT P   Encounter Date: 01/13/2018  PT End of Session - 01/13/18 1024    Visit Number  22    Number of Visits  41    Date for PT Re-Evaluation  02/03/18    PT Start Time  0109    PT Stop Time  1100    PT Time Calculation (min)  45 min    Equipment Utilized During Treatment  Gait belt    Activity Tolerance  Patient tolerated treatment well;Other (comment) Easily distracted    Behavior During Therapy  [facility] for tasks assessed/performed       Past Medical History:  Diagnosis Date  . Asthma   . Chronic lower back pain   . COPD (chronic obstructive pulmonary disease) ([place])   . Diabetes mellitus without complication ([place])   . Gastrointestinal bleed   . GERD (gastroesophageal reflux disease)   . History of colon polyps   . Hypertension   . Hypertensive heart disease   . IBS (irritable bowel syndrome)   . Ischemic colitis ([place])   . Non-obstructive Coronary Artery Disease    a. 05/2009 Cath [facility]): minimal nonobs dzs.  . Osteoarthritis   . PAF (paroxysmal atrial fibrillation) ([place])    a. 09/2014 during GI illness;  b. CHA2DS2VASc = 5 (not currently on [place] 2/2 h/o GIB/ischemic colitis); c. 09/2014 Echo: EF 60-65%, imparied relaxation, nl RV size/fxn.  . Transient cerebral ischemia due to atrial fibrillation [facility])     Past Surgical History:  Procedure Laterality Date  . ABDOMINAL HYSTERECTOMY    . APPENDECTOMY    . BACK SURGERY     x 3, last 2004.  . CHOLECYSTECTOMY    . COLONOSCOPY WITH PROPOFOL N/A 03/20/2017   Procedure: COLONOSCOPY WITH PROPOFOL;  Surgeon: Jonathon Bellows, MD;  Location: [facility] ENDOSCOPY;  Service: Endoscopy;  Laterality: N/A;  . ESOPHAGOGASTRODUODENOSCOPY (EGD) WITH PROPOFOL N/A  03/20/2017   Procedure: ESOPHAGOGASTRODUODENOSCOPY (EGD) WITH PROPOFOL;  Surgeon: Jonathon Bellows, MD;  Location: [facility] ENDOSCOPY;  Service: Endoscopy;  Laterality: N/A;  . FOOT SURGERY Right   . HEMORRHOID BANDING    . left total hip arthroplasty  2012  . STOMACH SURGERY    . TONSILLECTOMY      There were no vitals filed for this visit.  Subjective Assessment - 01/13/18 1022    Subjective  Patient and patient daughter report patient woke up with pain in head. Patient unable to describe pain but does not present with facial droop, word slurring, or discoordination. Patient confusion continues to limit verbalization of symptoms.     Pertinent History  Yvette Collier a 77 y.o.right handed femalewith history of hypertension,[name],diabetes mellitus, COPD. Patient lives alone independent prior to admission and works as a Theme park manager. One level home. Admitted 09/01/2017 with altered mental statusright side weaknessas well as aphasia. CT of the head showed a large acute left temporal lobe hematoma approximately 15.9 mL suspect hypertensive hemorrhage.Pt presents today with fluent aphasia.    Limitations  Walking    How long can you sit comfortably?  30 minutes before needing to shift positions    How long can you stand comfortably?  about 30 minutes before needing to sit and rest  How long can you walk comfortably?  states that she has no issues with walking    Patient Stated Goals  "get stronger"    Currently in Pain?  Yes    Pain Score  -- unable to rate    Pain Location  Head    Pain Descriptors / Indicators  Headache      Nustep Lvl 5 4 minutes     TherEx   Ankle weights: #5        Seated LAQ 2x 15x each leg         seated Marching 2x 15x each leg         Standing hip flexion 2x 10x each leg SUE support         Standing hip extension 2x 10x each leg SUE support          Standing hip abduction 2x10x each leg SUE support    seated adduction  ball squeezes 2x 15  Seated heel toe raises 15x each leg       Neuro Re-ed  Walking with horizontal head turns 2x100 ft, requiring mod verbal cueing and CGA, vertical head turns 2x 100 ft with max verbal cueing and CGA, occasional LOB with upward gaze.    Stand without support performing dynamic leg motions without LOB, occasional excessive trunk flexion due to fatigue     Airex pad: tandem walking 4x, side stepping 4x        Pt requires frequent visual, verbal and tactile cues in order to complete tasks today. Performance improved when PT performed task with patient.    Pt. response to medical necessity:  Patient will continue to benefit from skilled physical therapy to improve dynamic standing balance and increase strength in order to decrease fall risk                         PT Education - 01/13/18 1023    Education provided  Yes    Education Details  functional strength and balance    Person(s) Educated  Patient    Methods  Explanation;Demonstration;Verbal cues    Comprehension  Verbalized understanding;Returned demonstration       PT Short Term Goals - 12/09/17 1151      PT SHORT TERM GOAL #1   Title  Pt will improve 5x sit to stand to 16 seconds in order to demonstrate improved LE strength and improved dynamic balance to decrease risk for falls.    Baseline  18 sec, 10/15/17: 21.41 s,  22.48 sec    Time  6    Period  Weeks    Status  On-going      PT SHORT TERM GOAL #2   Title  Pt will improve gait speed to at least 0.8 m/s, demonstrating improved LE strength in order to safely ambulate with decreased risk for fals.    Baseline  0.688 m/s, 10/15/17: 1.0 m/s, , . 62 m/sec    Time  6    Period  Weeks    Status  Achieved      PT SHORT TERM GOAL #3   Title  Pt will improve TUG to at least 16 seconds in order to demonstrate improve LE strength and dynamic balance.    Baseline  19 sec; 10/15/17: 11.23s,     Time  6    Period  Weeks    Status   Achieved      PT SHORT TERM GOAL #4  Title  Pt will improve Berg Balance score to 46/56 in order to demonstarte improved dynamic and static balance in order to decrease overall falls risk.    Baseline  41/56; 51/56    Time  6    Period  Weeks    Status  Achieved      PT SHORT TERM GOAL #5   Title  Patient will increase dynamic gait index score to >15/24 as to demonstrate reduced fall risk and improved dynamic gait balance for better safety with community/home ambulation.     Baseline  13/24    Time  2    Period  Weeks    Status  New    Target Date  12/23/17        PT Long Term Goals - 12/09/17 1152      PT LONG TERM GOAL #1   Title  Pt will demonstrate independence with HEP in order to continue LE strengthening and balance interventions at home to manage symptoms and decrease overall falls risk.    Baseline  HEP compliant    Time  12    Period  Weeks    Status  On-going      PT LONG TERM GOAL #2   Title  Pt will improve 5x sit to stand to <15 seconds in order to demonstrate improved LE strength and improved balance in order to decrease falls risk.    Baseline  18 sec; 10/15/17: 21.41s, 22.48    Time  12    Period  Weeks    Status  On-going      PT LONG TERM GOAL #3   Title  Pt will improve gait speed to at least 1.0 m/s in order to demonstrate improved LE strength and balance in order to ambulate with decreased risk for falls.    Baseline  0.688 m/s; 10/15/17: 1.0 m/s     Time  12    Period  Weeks    Status  Achieved      PT LONG TERM GOAL #4   Title  Pt will demonstrate an improved TUG score to < or equal to 14 seconds in order to demonstrate improved LE strength and balance in order to decrease overall falls risk.    Baseline  19 sec; 10/15/17: 11.23s    Time  12    Period  Weeks    Status  Achieved      PT LONG TERM GOAL #5   Title  Pt will demonstrate an improved Berg Balance score to at least 51/56 in order to demonstrate improved dynamic and static balance  activities.    Baseline  41/56; 10/15/17: 51/56    Time  12    Period  Weeks    Status  Achieved      Additional Long Term Goals   Additional Long Term Goals  Yes      PT LONG TERM GOAL #6   Title  Pt will improve LE strength to 4+/5 in all limited planes in order to increase funtional abilities and improve gait.    Baseline  Gross strength assessment: 3+/5, L hip ext 2+/5, R hip ext 3-/5; 10/15/17: hip extension 3/5 bilaterally, hip abduction/adduction: 3+/5 on R side, 4-/5 on L side    Time  12    Period  Weeks    Status  On-going      PT LONG TERM GOAL #7   Title  Pt will improve gait speed to at least 1.2 m/s in  order to demonstrate improved LE strength and balance in order to ambulate with decreased risk for falls.    Baseline  0.688 m/s; 10/15/17: 1.0 m/s, .62 /sec 12/18: .67 m/s    Time  8    Period  Weeks    Status  Partially Met      PT LONG TERM GOAL #8   Title  Pt will demonstrate an improved Berg Balance score to at least 54/56 in order to demonstrate improved dynamic and static balance activities.    Baseline  41/56; 10/15/17: 51/56 12/18: 52/56    Time  8    Period  Weeks    Status  On-going      PT LONG TERM GOAL  #9   TITLE  Patient will increase dynamic gait index score to >19/24 as to demonstrate reduced fall risk and improved dynamic gait balance for better safety with community/home ambulation.     Baseline  12/18: 13/24    Time  8    Period  Weeks    Status  New    Target Date  02/03/18            Plan - 01/13/18 1044    Clinical Impression Statement  Patient requiring more frequent cueing and had decreased proper word choice creating more challenging communication between patient and therapist.  Patient required more frequent seated rest breaks today due to fatigue, symptoms monitored throughout session and caregiver educated on FAST warning signs.  Patient will continue to benefit from skilled physical therapy to improve dynamic standing balance  and increase strength in order to decrease fall risk    Rehab Potential  Good    PT Frequency  2x / week    PT Duration  8 weeks    PT Treatment/Interventions  Aquatic Therapy;Cryotherapy;Moist Heat;Gait training;Stair training;Functional mobility training;Therapeutic activities;Therapeutic exercise;Balance training;Neuromuscular re-education;Patient/family education;Manual techniques;Passive range of motion    PT Next Visit Plan  begin LE strength and dynamic balance activities    PT Home Exercise Plan  seated marches, seated hip ABD with red theraband    Consulted and Agree with Plan of Care  Patient       Patient will benefit from skilled therapeutic intervention in order to improve the following deficits and impairments:  Abnormal gait, Decreased coordination, Decreased activity tolerance, Decreased balance, Decreased cognition, Decreased mobility, Decreased safety awareness, Decreased strength, Difficulty walking  Visit Diagnosis: Muscle weakness (generalized)  Other lack of coordination  Unsteadiness on feet     Problem List Patient Active Problem List   Diagnosis Date Noted  . Gait disturbance, post-stroke 09/04/2017  . Wernicke's aphasia 09/04/2017  . 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 However, needs to rule out CAA, tumor or CVT (vein of labbe)  09/01/2017  . Constipation 01/06/2017  . DJD (degenerative joint disease) of knee 07/18/2016  . GIB (gastrointestinal bleeding) 05/25/2016  . GI bleed 05/24/2016  . Degenerative joint disease (DJD) of hip 05/08/2016  . 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 Hypertensive heart disease   . Hemorrhage of gastrointestinal tract 11/13/2015  . Lumbosacral facet joint syndrome 07/26/2015  . Angioedema 07/13/2015  . DM2 (diabetes mellitus, type 2) ([place]) 07/13/2015  . Status post lumbar laminectomy 06/19/2015  . Lumbar radiculopathy  06/19/2015  . DDD (degenerative disc disease), lumbar 05/11/2015  . Facet syndrome, lumbar 05/11/2015  . Lumbar post-laminectomy syndrome 05/11/2015  . Sacroiliac joint disease 05/11/2015  .  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 Neuropathy due to secondary diabetes ([place]) 05/11/2015  . Essential hypertension 01/03/2015  . Coronary atherosclerosis of native coronary artery   . PAF (paroxysmal atrial fibrillation) ([place]) 10/20/2014  . Ischemic colitis ([place]) 10/20/2014  . Diabetes mellitus type 2 with complications ([place]) 43/73/5789  . COPD (chronic obstructive pulmonary disease) ([place]) 10/20/2014  . Hyperlipidemia 10/20/2014  . GERD (gastroesophageal reflux disease) 10/20/2014  . Ingrowing nail, right great toe 04/27/2014  . Internal hemorrhoid, bleeding 07/14/2013   Janna Arch, PT, DPT   Janna Arch 01/13/2018, 11:05 AM  [place] MAIN [facility] SERVICES [address] [place], [place], [address] Phone: [phone]   Fax:  [phone]  Name: Yvette Collier MRN: [id number] Date of Birth: 06-29-1941

## 2018-01-13 NOTE — Therapy (Signed)
[place] [facility] MAIN [facility] SERVICES [address] [place], [place], [address] Phone: [phone]   Fax:  [phone]  Occupational Therapy Treatment  Patient Details  Name: Yvette Collier MRN: [id number] Date of Birth: 28-Sep-1941 Referring Provider: BURNS, HARRIETT P   Encounter Date: 01/13/2018  OT End of Session - 01/13/18 1117    Visit Number  21    Number of Visits  48    Date for OT Re-Evaluation  03/03/18    OT Start Time  1100    OT Stop Time  1145    OT Time Calculation (min)  45 min    Activity Tolerance  Patient tolerated treatment well    Behavior During Therapy  [facility] for tasks assessed/performed       Past Medical History:  Diagnosis Date  . Asthma   . Chronic lower back pain   . COPD (chronic obstructive pulmonary disease) (HCC)   . Diabetes mellitus without complication (HCC)   . Gastrointestinal bleed   . GERD (gastroesophageal reflux disease)   . History of colon polyps   . Hypertension   . Hypertensive heart disease   . IBS (irritable bowel syndrome)   . Ischemic colitis (HCC)   . Non-obstructive Coronary Artery Disease    a. 05/2009 Cath [facility]): minimal nonobs dzs.  . Osteoarthritis   . PAF (paroxysmal atrial fibrillation) (HCC)    a. 09/2014 during GI illness;  b. CHA2DS2VASc = 5 (not currently on [facility] 2/2 h/o GIB/ischemic colitis); c. 09/2014 Echo: EF 60-65%, imparied relaxation, nl RV size/fxn.  . Transient cerebral ischemia due to atrial fibrillation [facility])     Past Surgical History:  Procedure Laterality Date  . ABDOMINAL HYSTERECTOMY    . APPENDECTOMY    . BACK SURGERY     x 3, last 2004.  . CHOLECYSTECTOMY    . COLONOSCOPY WITH PROPOFOL N/A 03/20/2017   Procedure: COLONOSCOPY WITH PROPOFOL;  Surgeon: Wyline Mood, MD;  Location: [facility] ENDOSCOPY;  Service: Endoscopy;  Laterality: N/A;  . ESOPHAGOGASTRODUODENOSCOPY (EGD) WITH PROPOFOL N/A 03/20/2017   Procedure: ESOPHAGOGASTRODUODENOSCOPY (EGD) WITH PROPOFOL;   Surgeon: Wyline Mood, MD;  Location: [facility] ENDOSCOPY;  Service: Endoscopy;  Laterality: N/A;  . FOOT SURGERY Right   . HEMORRHOID BANDING    . left total hip arthroplasty  2012  . STOMACH SURGERY    . TONSILLECTOMY      There were no vitals filed for this visit.  Subjective Assessment - 01/13/18 1116    Subjective   Pt. reports no pain    Patient is accompained by:  Family member    Pertinent History  Pt. is a 77 y.o. female who suffered a Left temporla Lobe Hemorrhage secondary to Hypertensive Crisis, Moderate ICH, Left Frontal Trace SDH, and Traumatic ICH secondary to a fall. Pt. PMHX includes: HTN, DM with peripheral Neuropathy, Acute Kidney Injury, Hyperlipdidemia, and history of a GI Bleed. Pt. went the inpatient rehab at [facility], was discharged, and now is ready for outpatient rehab services.    Currently in Pain?  No/denies         OT TREATMENT    Therapeutic Exercise:  Pt. worked on the [employer] for 6 min. with constant monitoring of the BUEs. Pt. Worked on level 4.  Cognition:  Pt. Worked on matching, and sorting cards. Pt. required cues initially, and visual demonstration. Pt. required minimal cues for matching multiple cards.  OT Education - 01/13/18 1116    Education provided  Yes    Education Details  UE strength, coordination and cognition    Person(s) Educated  Patient    Methods  Demonstration;Verbal cues    Comprehension  Verbalized understanding;Returned demonstration         OT Long Term Goals - 12/09/17 0908      OT LONG TERM GOAL #1   Title  Pt. will be independent with basic ADL care needs.    Baseline  Eval: Pt. requires assist, 11/13: Pt. requires minA UE, ModA LE, 12/09/2018: Pt. conitnues to require assist.    Time  12    Period  Weeks    Status  On-going    Target Date  03/03/18      OT LONG TERM GOAL #2   Title  Pt. will be independent with light meal preparation.    Baseline  Eval: Pt. is unable,  11/04/2017: Pt. is able to assist with light cold meal prep, ahowver requires assist,. 12/09/2018: Pt. continues to require assist    Time  12    Period  Weeks    Status  On-going    Target Date  03/03/18      OT LONG TERM GOAL #3   Title  Pt. will be independent with tub/shower transfers    Baseline  Eval: Pt. requires assist, 11/04/2017: pt. conitnues to require assist using a shower seat.    Time  12    Period  Weeks    Status  On-going    Target Date  03/03/18      OT LONG TERM GOAL #4   Title  Pt. will increase BUE strength by 2 mm grades to assist with ADLs.    Baseline  Eval: BUE strength 4/5 overall. 12/09/2018: BUEs: shoulder flexion, and abduction 4/5, elbow flexion/extension: 4+/5    Time  12    Period  Weeks    Status  On-going    Target Date  03/03/18      OT LONG TERM GOAL #5   Title  Pt. will improve right [facility] skills to be able to assist with buttoning, and zipping.     Baseline  Eval: impaired, 11/04/2017:  Pt. continues to require work on improving [facility] skills for buttnoning, and zipping clothing. 12/08/2017: Pt. continues to require assist    Time  12    Period  Weeks    Status  On-going    Target Date  03/03/18      OT LONG TERM GOAL #6   Title  Pt. will improve right grip strength to be able to open containers.    Baseline  Eval: Limited. 11/04/2017: improving, 12/09/2017: Pt. continues to have difficulty    Time  12    Period  Weeks    Status  On-going    Target Date  03/03/18      OT LONG TERM GOAL #7   Title  Pt. will identify potential safety hazards accurately during ADLs, and IADLs with 100% accuracy.    Baseline  Eval: limited 11/04/2017: Conitnues to be impaired, 12/09/2017: Pt. requires frequent cues.    Time  12    Period  Weeks    Status  On-going    Target Date  03/03/18      OT LONG TERM GOAL #8   Title  Pt. will demonstrate cognitive compensatory techniques 100% of the time with minimal cues.    Baseline  Eval: limite11/13/2018:  conitnues  to be limited. 12/09/2018: Pt. continues to require assist.    Time  12    Period  Weeks    Target Date  03/03/18            Plan - 01/13/18 1117    Clinical Impression Statement Pt. required more cues today secondary to confusion. Pt. worked on sorting, and matching cards. Pt. continues to work on improving UE functioning, coordination, and cognition for improved ADL, and IADL functioning. Pt. Required extensive cues for initiating to sit in a chair today.   Occupational performance deficits (Please refer to evaluation for details):  ADL's;IADL's    Rehab Potential  Good    OT Frequency  2x / week    OT Duration  12 weeks    OT Treatment/Interventions  Self-care/ADL training;Therapeutic activities;Therapeutic exercise    Clinical Decision Making  Limited treatment options, no task modification necessary    Consulted and Agree with Plan of Care  Patient       Patient will benefit from skilled therapeutic intervention in order to improve the following deficits and impairments:  Abnormal gait, Decreased activity tolerance, Decreased cognition, Decreased balance, Decreased strength, Impaired UE functional use, Decreased coordination  Visit Diagnosis: Muscle weakness (generalized)  Other lack of coordination  Cognitive social or emotional deficit following cerebral infarction    Problem List Patient Active Problem List   Diagnosis Date Noted  . Gait disturbance, post-stroke 09/04/2017  . Wernicke's aphasia 09/04/2017  . 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 However, needs to rule out CAA, tumor or CVT (vein of labbe)  09/01/2017  . Constipation 01/06/2017  . DJD (degenerative joint disease) of knee 07/18/2016  . GIB (gastrointestinal bleeding) 05/25/2016  . GI bleed 05/24/2016  . Degenerative joint disease (DJD) of hip 05/08/2016  . 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  Hypertensive heart disease   . Hemorrhage of gastrointestinal tract 11/13/2015  . Lumbosacral facet joint syndrome 07/26/2015  . Angioedema 07/13/2015  . DM2 (diabetes mellitus, type 2) (HCC) 07/13/2015  . Status post lumbar laminectomy 06/19/2015  . Lumbar radiculopathy 06/19/2015  . DDD (degenerative disc disease), lumbar 05/11/2015  . Facet syndrome, lumbar 05/11/2015  . Lumbar post-laminectomy syndrome 05/11/2015  . Sacroiliac joint disease 05/11/2015  . 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 Neuropathy due to secondary diabetes (HCC) 05/11/2015  . Essential hypertension 01/03/2015  . Coronary atherosclerosis of native coronary artery   . PAF (paroxysmal atrial fibrillation) (HCC) 10/20/2014  . Ischemic colitis (HCC) 10/20/2014  . Diabetes mellitus type 2 with complications (HCC) 10/20/2014  . COPD (chronic obstructive pulmonary disease) (HCC) 10/20/2014  . Hyperlipidemia 10/20/2014  . GERD (gastroesophageal reflux disease) 10/20/2014  . Ingrowing nail, right great toe 04/27/2014  . Internal hemorrhoid, bleeding 07/14/2013    Olegario MessierElaine Brodan Grewell, MS, OTR/L 01/13/2018, 11:30 AM  [facility] [address] [place], [place], [address] Phone: [phone]   Fax:  [phone]  Name: Tomma RakersMary L Gatley MRN: [id number] Date of Birth: November 05, 1941

## 2018-01-15 ENCOUNTER — Ambulatory Visit: Payer: Medicare Other | Admitting: Speech Pathology

## 2018-01-15 ENCOUNTER — Ambulatory Visit: Payer: Medicare Other

## 2018-01-15 ENCOUNTER — Ambulatory Visit: Payer: Medicare Other | Admitting: Occupational Therapy

## 2018-01-20 ENCOUNTER — Ambulatory Visit: Payer: Medicare Other | Admitting: Occupational Therapy

## 2018-01-20 ENCOUNTER — Encounter: Payer: Self-pay | Admitting: Occupational Therapy

## 2018-01-20 ENCOUNTER — Ambulatory Visit: Payer: Medicare Other

## 2018-01-20 ENCOUNTER — Ambulatory Visit: Payer: Medicare Other | Admitting: Speech Pathology

## 2018-01-20 DIAGNOSIS — R2681 Unsteadiness on feet: Secondary | ICD-10-CM

## 2018-01-20 DIAGNOSIS — M6281 Muscle weakness (generalized): Secondary | ICD-10-CM

## 2018-01-20 DIAGNOSIS — R278 Other lack of coordination: Secondary | ICD-10-CM

## 2018-01-20 DIAGNOSIS — R4701 Aphasia: Secondary | ICD-10-CM

## 2018-01-20 NOTE — Therapy (Signed)
[place] MAIN [facility] SERVICES [address] [place], [place], [address] Phone: [phone]   Fax:  [phone]  Physical Therapy Treatment  Patient Details  Name: Yvette Collier MRN: [id number] Date of Birth: 1941/04/08 Referring Provider: BURNS, HARRIETT P   Encounter Date: 01/20/2018  PT End of Session - 01/20/18 1031    Visit Number  23    Number of Visits  41    Date for PT Re-Evaluation  02/03/18    PT Start Time  6144    PT Stop Time  1100    PT Time Calculation (min)  45 min    Equipment Utilized During Treatment  Gait belt    Activity Tolerance  Patient tolerated treatment well;Other (comment) Easily distracted    Behavior During Therapy  [facility] for tasks assessed/performed       Past Medical History:  Diagnosis Date  . Asthma   . Chronic lower back pain   . COPD (chronic obstructive pulmonary disease) ([place])   . Diabetes mellitus without complication ([place])   . Gastrointestinal bleed   . GERD (gastroesophageal reflux disease)   . History of colon polyps   . Hypertension   . Hypertensive heart disease   . IBS (irritable bowel syndrome)   . Ischemic colitis ([place])   . Non-obstructive Coronary Artery Disease    a. 05/2009 Cath [facility]): minimal nonobs dzs.  . Osteoarthritis   . PAF (paroxysmal atrial fibrillation) ([place])    a. 09/2014 during GI illness;  b. CHA2DS2VASc = 5 (not currently on [place] 2/2 h/o GIB/ischemic colitis); c. 09/2014 Echo: EF 60-65%, imparied relaxation, nl RV size/fxn.  . Transient cerebral ischemia due to atrial fibrillation [facility])     Past Surgical History:  Procedure Laterality Date  . ABDOMINAL HYSTERECTOMY    . APPENDECTOMY    . BACK SURGERY     x 3, last 2004.  . CHOLECYSTECTOMY    . COLONOSCOPY WITH PROPOFOL N/A 03/20/2017   Procedure: COLONOSCOPY WITH PROPOFOL;  Surgeon: Jonathon Bellows, MD;  Location: [facility] ENDOSCOPY;  Service: Endoscopy;  Laterality: N/A;  . ESOPHAGOGASTRODUODENOSCOPY (EGD) WITH PROPOFOL N/A  03/20/2017   Procedure: ESOPHAGOGASTRODUODENOSCOPY (EGD) WITH PROPOFOL;  Surgeon: Jonathon Bellows, MD;  Location: [facility] ENDOSCOPY;  Service: Endoscopy;  Laterality: N/A;  . FOOT SURGERY Right   . HEMORRHOID BANDING    . left total hip arthroplasty  2012  . STOMACH SURGERY    . TONSILLECTOMY      There were no vitals filed for this visit.  Subjective Assessment - 01/20/18 1020    Subjective  Patient reports no falls or LOB since last session, having some L knee pain occasionally, not right now. Feeling very sleeping.     Pertinent History  Yvette Collier a 77 y.o.right handed femalewith history of hypertension,[name],diabetes mellitus, COPD. Patient lives alone independent prior to admission and works as a Theme park manager. One level home. Admitted 09/01/2017 with altered mental statusright side weaknessas well as aphasia. CT of the head showed a large acute left temporal lobe hematoma approximately 15.9 mL suspect hypertensive hemorrhage.Pt presents today with fluent aphasia.    Limitations  Walking    How long can you sit comfortably?  30 minutes before needing to shift positions    How long can you stand comfortably?  about 30 minutes before needing to sit and rest    How long can you walk comfortably?  states that she has no issues  with walking    Patient Stated Goals  "get stronger"    Currently in Pain?  No/denies        Neuro Re-ed  Airex pad: Marching 20x Airex pad: eyes closed 2x 30 seconds      Airex pad: tandem walking 4x, side stepping 4x , occasional cueing for not using UE's.   TherEx Ambulate 400 ft with CGA and cues for lifting LLE knee and increase heel strike for LLE.    Ankle weights: #5        Seated LAQ 2x 15x each leg         seated Marching 2x 15x each leg         Standing hip flexion 2x 10x each leg SUE support         Standing hip extension 2x 10x each leg SUE support          Standing hip abduction 2x10x each leg SUE  support    seated adduction ball squeezes 2x 15   Seated heel toe raises 15x each leg                    PT Education - 01/20/18 1031    Education provided  Yes    Education Details  functional strength and balance    Person(s) Educated  Patient    Methods  Explanation;Demonstration;Verbal cues    Comprehension  Verbalized understanding;Returned demonstration       PT Short Term Goals - 12/09/17 1151      PT SHORT TERM GOAL #1   Title  Pt will improve 5x sit to stand to 16 seconds in order to demonstrate improved LE strength and improved dynamic balance to decrease risk for falls.    Baseline  18 sec, 10/15/17: 21.41 s,  22.48 sec    Time  6    Period  Weeks    Status  On-going      PT SHORT TERM GOAL #2   Title  Pt will improve gait speed to at least 0.8 m/s, demonstrating improved LE strength in order to safely ambulate with decreased risk for fals.    Baseline  0.688 m/s, 10/15/17: 1.0 m/s, , . 62 m/sec    Time  6    Period  Weeks    Status  Achieved      PT SHORT TERM GOAL #3   Title  Pt will improve TUG to at least 16 seconds in order to demonstrate improve LE strength and dynamic balance.    Baseline  19 sec; 10/15/17: 11.23s,     Time  6    Period  Weeks    Status  Achieved      PT SHORT TERM GOAL #4   Title  Pt will improve Berg Balance score to 46/56 in order to demonstarte improved dynamic and static balance in order to decrease overall falls risk.    Baseline  41/56; 51/56    Time  6    Period  Weeks    Status  Achieved      PT SHORT TERM GOAL #5   Title  Patient will increase dynamic gait index score to >15/24 as to demonstrate reduced fall risk and improved dynamic gait balance for better safety with community/home ambulation.     Baseline  13/24    Time  2    Period  Weeks    Status  New    Target Date  12/23/17  PT Long Term Goals - 12/09/17 1152      PT LONG TERM GOAL #1   Title  Pt will demonstrate independence with  HEP in order to continue LE strengthening and balance interventions at home to manage symptoms and decrease overall falls risk.    Baseline  HEP compliant    Time  12    Period  Weeks    Status  On-going      PT LONG TERM GOAL #2   Title  Pt will improve 5x sit to stand to <15 seconds in order to demonstrate improved LE strength and improved balance in order to decrease falls risk.    Baseline  18 sec; 10/15/17: 21.41s, 22.48    Time  12    Period  Weeks    Status  On-going      PT LONG TERM GOAL #3   Title  Pt will improve gait speed to at least 1.0 m/s in order to demonstrate improved LE strength and balance in order to ambulate with decreased risk for falls.    Baseline  0.688 m/s; 10/15/17: 1.0 m/s     Time  12    Period  Weeks    Status  Achieved      PT LONG TERM GOAL #4   Title  Pt will demonstrate an improved TUG score to < or equal to 14 seconds in order to demonstrate improved LE strength and balance in order to decrease overall falls risk.    Baseline  19 sec; 10/15/17: 11.23s    Time  12    Period  Weeks    Status  Achieved      PT LONG TERM GOAL #5   Title  Pt will demonstrate an improved Berg Balance score to at least 51/56 in order to demonstrate improved dynamic and static balance activities.    Baseline  41/56; 10/15/17: 51/56    Time  12    Period  Weeks    Status  Achieved      Additional Long Term Goals   Additional Long Term Goals  Yes      PT LONG TERM GOAL #6   Title  Pt will improve LE strength to 4+/5 in all limited planes in order to increase funtional abilities and improve gait.    Baseline  Gross strength assessment: 3+/5, L hip ext 2+/5, R hip ext 3-/5; 10/15/17: hip extension 3/5 bilaterally, hip abduction/adduction: 3+/5 on R side, 4-/5 on L side    Time  12    Period  Weeks    Status  On-going      PT LONG TERM GOAL #7   Title  Pt will improve gait speed to at least 1.2 m/s in order to demonstrate improved LE strength and balance in order  to ambulate with decreased risk for falls.    Baseline  0.688 m/s; 10/15/17: 1.0 m/s, .62 /sec 12/18: .67 m/s    Time  8    Period  Weeks    Status  Partially Met      PT LONG TERM GOAL #8   Title  Pt will demonstrate an improved Berg Balance score to at least 54/56 in order to demonstrate improved dynamic and static balance activities.    Baseline  41/56; 10/15/17: 51/56 12/18: 52/56    Time  8    Period  Weeks    Status  On-going      PT LONG TERM GOAL  #9   TITLE  Patient will increase dynamic gait index score to >19/24 as to demonstrate reduced fall risk and improved dynamic gait balance for better safety with community/home ambulation.     Baseline  12/18: 13/24    Time  8    Period  Weeks    Status  New    Target Date  02/03/18            Plan - 01/20/18 1051    Clinical Impression Statement  Patient requiring additional cueing for task orientation today due to fatigue. Additional seated rest breaks required due to fatigue. Patient dynamic balance improving with decreased need for CGA at this time. LLE fatigues quicker than RLE due to increased weakness. Patient will continue to benefit from skilled physical therapy to improve dynamic standing balance and increase strength in order to decrease fall risk.     Rehab Potential  Good    PT Frequency  2x / week    PT Duration  8 weeks    PT Treatment/Interventions  Aquatic Therapy;Cryotherapy;Moist Heat;Gait training;Stair training;Functional mobility training;Therapeutic activities;Therapeutic exercise;Balance training;Neuromuscular re-education;Patient/family education;Manual techniques;Passive range of motion    PT Next Visit Plan  begin LE strength and dynamic balance activities    PT Home Exercise Plan  seated marches, seated hip ABD with red theraband    Consulted and Agree with Plan of Care  Patient       Patient will benefit from skilled therapeutic intervention in order to improve the following deficits and  impairments:  Abnormal gait, Decreased coordination, Decreased activity tolerance, Decreased balance, Decreased cognition, Decreased mobility, Decreased safety awareness, Decreased strength, Difficulty walking  Visit Diagnosis: Muscle weakness (generalized)  Other lack of coordination  Unsteadiness on feet     Problem List Patient Active Problem List   Diagnosis Date Noted  . Gait disturbance, post-stroke 09/04/2017  . Wernicke's aphasia 09/04/2017  . 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 However, needs to rule out CAA, tumor or CVT (vein of labbe)  09/01/2017  . Constipation 01/06/2017  . DJD (degenerative joint disease) of knee 07/18/2016  . GIB (gastrointestinal bleeding) 05/25/2016  . GI bleed 05/24/2016  . Degenerative joint disease (DJD) of hip 05/08/2016  . 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 Hypertensive heart disease   . Hemorrhage of gastrointestinal tract 11/13/2015  . Lumbosacral facet joint syndrome 07/26/2015  . Angioedema 07/13/2015  . DM2 (diabetes mellitus, type 2) ([place]) 07/13/2015  . Status post lumbar laminectomy 06/19/2015  . Lumbar radiculopathy 06/19/2015  . DDD (degenerative disc disease), lumbar 05/11/2015  . Facet syndrome, lumbar 05/11/2015  . Lumbar post-laminectomy syndrome 05/11/2015  . Sacroiliac joint disease 05/11/2015  . 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 Neuropathy due to secondary diabetes ([place]) 05/11/2015  . Essential hypertension 01/03/2015  . Coronary atherosclerosis of native coronary artery   . PAF (paroxysmal atrial fibrillation) ([place]) 10/20/2014  . Ischemic colitis ([place]) 10/20/2014  . Diabetes mellitus type 2 with complications ([place]) 06/21/1600  . COPD (chronic obstructive pulmonary disease) ([place]) 10/20/2014  . Hyperlipidemia 10/20/2014  . GERD (gastroesophageal reflux disease) 10/20/2014  .  Ingrowing nail, right great toe 04/27/2014  . Internal hemorrhoid, bleeding 07/14/2013   Janna Arch, PT, DPT   Janna Arch 01/20/2018, 10:57 AM  [place] MAIN [facility] SERVICES [address] [place], [place], [address] Phone: [phone]   Fax:  [phone]  Name: Evalynn Hankins  Barresi MRN: [id number] Date of Birth: 03-Nov-1941

## 2018-01-20 NOTE — Therapy (Signed)
[facility] MAIN [facility] [address] [place], [place], [address] Phone: [phone]   Fax:  [phone]  Occupational Therapy Treatment  Patient Details  Name: Yvette RakersMary L Collier MRN: [id number] Date of Birth: [birth year] Referring Provider: BURNS, HARRIETT P   Encounter Date: 01/20/2018  OT End of Session - 01/20/18 1107    Visit Number  22    Number of Visits  48    Date for OT Re-Evaluation  03/03/18    OT Start Time  1100    OT Stop Time  1145    OT Time Calculation (min)  45 min    Activity Tolerance  Patient tolerated treatment well    Behavior During Therapy  [facility] assessed/performed       Past Medical History:  Diagnosis Date  . Asthma   . Chronic lower back pain   . COPD (chronic obstructive pulmonary disease) (HCC)   . Diabetes mellitus without complication (HCC)   . Gastrointestinal bleed   . GERD (gastroesophageal reflux disease)   . History of colon polyps   . Hypertension   . Hypertensive heart disease   . IBS (irritable bowel syndrome)   . Ischemic colitis (HCC)   . Non-obstructive Coronary Artery Disease    a. 05/2009 Cath [facility]): minimal nonobs dzs.  . Osteoarthritis   . PAF (paroxysmal atrial fibrillation) (HCC)    a. 09/2014 during GI illness;  b. CHA2DS2VASc = 5 (not currently on [place] 2/2 h/o GIB/ischemic colitis); c. 09/2014 Echo: EF 60-65%, imparied relaxation, nl RV size/fxn.  . Transient cerebral ischemia due to atrial fibrillation [facility](HCC)     Past Surgical History:  Procedure Laterality Date  . ABDOMINAL HYSTERECTOMY    . APPENDECTOMY    . BACK SURGERY     x 3, last 2004.  . CHOLECYSTECTOMY    . COLONOSCOPY WITH PROPOFOL N/A 03/20/2017   Procedure: COLONOSCOPY WITH PROPOFOL;  Surgeon: Wyline MoodKiran Anna, MD;  [facility];  Service: Endoscopy;  Laterality: N/A;  . ESOPHAGOGASTRODUODENOSCOPY (EGD) WITH PROPOFOL N/A 03/20/2017   Procedure: ESOPHAGOGASTRODUODENOSCOPY (EGD) WITH PROPOFOL;   Surgeon: Wyline MoodKiran Anna, MD;  Location: [facility] ENDOSCOPY;  Service: Endoscopy;  Laterality: N/A;  . FOOT SURGERY Right   . HEMORRHOID BANDING    . left total hip arthroplasty  2012  . STOMACH SURGERY    . TONSILLECTOMY      There were no vitals filed for this visit.  Subjective Assessment - 01/20/18 1106    Subjective   Pt. reports she is doing well today.    Patient is accompained by:  Family member    Pertinent History  Pt. is a 77 y.o. female who suffered a Left temporla Lobe Hemorrhage secondary to Hypertensive Crisis, Moderate ICH, Left Frontal Trace SDH, and Traumatic ICH secondary to a fall. Pt. PMHX includes: HTN, DM with peripheral Neuropathy, Acute Kidney Injury, Hyperlipdidemia, and history of a GI Bleed. [facility]. [facility], was discharged, and now is ready for outpatient rehab services.    Patient Stated Goals  Patient reports she would like to do as much as she can for herself.    Currently in Pain?  No/denies       OT TREATMENT    Neuro muscular re-education:  Pt. Worked on grasping coins from a tabletop surface, placing them into a resistive container, and pushing them through the slot while isolating his 2nd digit. Pt. Worked on grasping, storing, and stacking coins.  Therapeutic Exercise:  Pt. performed 2# dowel ex. For UE strengthening secondary to weakness. Bilateral shoulder flexion, chest press, circular patterns, and elbow flexion/extension were performed. 2# dumbbell ex. for elbow flexion and extension, forearm supination/pronation, wrist flexion/extension, and radial deviation. Pt. requires rest breaks and verbal cues for proper technique.                         OT Education - 01/20/18 1107    Education provided  Yes    Education Details  UE strength, and coordination    Person(s) Educated  Patient    Methods  Demonstration;Verbal cues    Comprehension  Verbalized understanding;Returned demonstration         OT  Long Term Goals - 12/09/17 0908      OT LONG TERM GOAL #1   Title  Pt. will be independent with basic ADL care needs.    Baseline  Eval: Pt. requires assist, 11/13: Pt. requires minA UE, ModA LE, 12/09/2018: Pt. conitnues to require assist.    Time  12    Period  Weeks    Status  On-going    Target Date  03/03/18      OT LONG TERM GOAL #2   Title  Pt. will be independent with light meal preparation.    Baseline  Eval: Pt. is unable, 11/04/2017: Pt. is able to assist with light cold meal prep, ahowver requires assist,. 12/09/2018: Pt. continues to require assist    Time  12    Period  Weeks    Status  On-going    Target Date  03/03/18      OT LONG TERM GOAL #3   Title  Pt. will be independent with tub/shower transfers    Baseline  Eval: Pt. requires assist, 11/04/2017: pt. conitnues to require assist using a shower seat.    Time  12    Period  Weeks    Status  On-going    Target Date  03/03/18      OT LONG TERM GOAL #4   Title  Pt. will increase BUE strength by 2 mm grades to assist with ADLs.    Baseline  Eval: BUE strength 4/5 overall. 12/09/2018: BUEs: shoulder flexion, and abduction 4/5, elbow flexion/extension: 4+/5    Time  12    Period  Weeks    Status  On-going    Target Date  03/03/18      OT LONG TERM GOAL #5   Title  Pt. will improve right [facility] skills to be able to assist with buttoning, and zipping.     Baseline  Eval: impaired, 11/04/2017:  Pt. continues to require work on improving [facility] skills for buttnoning, and zipping clothing. 12/08/2017: Pt. continues to require assist    Time  12    Period  Weeks    Status  On-going    Target Date  03/03/18      OT LONG TERM GOAL #6   Title  Pt. will improve right grip strength to be able to open containers.    Baseline  Eval: Limited. 11/04/2017: improving, 12/09/2017: Pt. continues to have difficulty    Time  12    Period  Weeks    Status  On-going    Target Date  03/03/18      OT LONG TERM GOAL #7   Title  Pt.  will identify potential safety hazards accurately during ADLs, and IADLs with 100% accuracy.  Baseline  Eval: limited 11/04/2017: Conitnues to be impaired, 12/09/2017: Pt. requires frequent cues.    Time  12    Period  Weeks    Status  On-going    Target Date  03/03/18      OT LONG TERM GOAL #8   Title  Pt. will demonstrate cognitive compensatory techniques 100% of the time with minimal cues.    Baseline  Eval: limite11/13/2018: conitnues to be limited. 12/09/2018: Pt. continues to require assist.    Time  12    Period  Weeks    Target Date  03/03/18            Plan - 01/20/18 1108    Clinical Impression Statement   Pt. reports she really worked her left hip. Pt. continues to present with limited UE strength, and coordination skills. Pt. continues to work on improving UE strength, coordination skills, and cognitive IADL functioning.     Occupational Profile and client history currently impacting functional performance  quires cues for safety as she attempts to leave her     Occupational performance deficits (Please refer to evaluation for details):  ADL's;IADL's    Rehab Potential  Good    OT Frequency  2x / week    OT Duration  12 weeks    OT Treatment/Interventions  Self-care/ADL training;Therapeutic activities;Therapeutic exercise    Clinical Decision Making  Limited treatment options, no task modification necessary    Consulted and Agree with Plan of Care  Patient       Patient will benefit from skilled therapeutic intervention in order to improve the following deficits and impairments:  Abnormal gait, Decreased activity tolerance, Decreased cognition, Decreased balance, Decreased strength, Impaired UE functional use, Decreased coordination  Visit Diagnosis: Muscle weakness (generalized)  Other lack of coordination    Problem List Patient Active Problem List   Diagnosis Date Noted  . Gait disturbance, post-stroke 09/04/2017  . Wernicke's aphasia 09/04/2017  .  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 However, needs to rule out CAA, tumor or CVT (vein of labbe)  09/01/2017  . Constipation 01/06/2017  . DJD (degenerative joint disease) of knee 07/18/2016  . GIB (gastrointestinal bleeding) 05/25/2016  . GI bleed 05/24/2016  . Degenerative joint disease (DJD) of hip 05/08/2016  . 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 Hypertensive heart disease   . Hemorrhage of gastrointestinal tract 11/13/2015  . Lumbosacral facet joint syndrome 07/26/2015  . Angioedema 07/13/2015  . DM2 (diabetes mellitus, type 2) (HCC) 07/13/2015  . Status post lumbar laminectomy 06/19/2015  . Lumbar radiculopathy 06/19/2015  . DDD (degenerative disc disease), lumbar 05/11/2015  . Facet syndrome, lumbar 05/11/2015  . Lumbar post-laminectomy syndrome 05/11/2015  . Sacroiliac joint disease 05/11/2015  . 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 Neuropathy due to secondary diabetes (HCC) 05/11/2015  . Essential hypertension 01/03/2015  . Coronary atherosclerosis of native coronary artery   . PAF (paroxysmal atrial fibrillation) (HCC) 10/20/2014  . Ischemic colitis (HCC) 10/20/2014  . Diabetes mellitus type 2 with complications (HCC) 10/20/2014  . COPD (chronic obstructive pulmonary disease) (HCC) 10/20/2014  . Hyperlipidemia 10/20/2014  . GERD (gastroesophageal reflux disease) 10/20/2014  . Ingrowing nail, right great toe 04/27/2014  . Internal hemorrhoid, bleeding 07/14/2013    Olegario Messier, MS, OTR/L 01/20/2018, 11:26 AM  [place] [facility] MAIN [facility] SERVICES [address] [place], [place], [address]  Phone: [phone]   Fax:  [phone]  Name: RASHEIDA BRODEN MRN: [id number] Date of Birth: 1941-04-27

## 2018-01-21 ENCOUNTER — Encounter: Payer: Self-pay | Admitting: Speech Pathology

## 2018-01-21 NOTE — Therapy (Signed)
[place] MAIN [facility] SERVICES [address] [place], [place], [address] Phone: [phone]   Fax:  [phone]  Speech Language Pathology Treatment/Re-Certification  Patient Details  Name: Yvette Collier MRN: [id number] Date of Birth: 28-Oct-1941 Referring Provider: Dr. Letta Pate   Encounter Date: 01/20/2018  End of Session - 01/21/18 0939    Visit Number  19    Number of Visits  35    Date for SLP Re-Evaluation  03/20/18    SLP Start Time  0920    SLP Stop Time   1000    SLP Time Calculation (min)  40 min    Activity Tolerance  Patient tolerated treatment well       Past Medical History:  Diagnosis Date  . Asthma   . Chronic lower back pain   . COPD (chronic obstructive pulmonary disease) ([place])   . Diabetes mellitus without complication ([place])   . Gastrointestinal bleed   . GERD (gastroesophageal reflux disease)   . History of colon polyps   . Hypertension   . Hypertensive heart disease   . IBS (irritable bowel syndrome)   . Ischemic colitis ([place])   . Non-obstructive Coronary Artery Disease    a. 05/2009 [facility]): minimal nonobs dzs.  . Osteoarthritis   . PAF (paroxysmal atrial fibrillation) ([place])    a. 09/2014 during GI illness;  b. CHA2DS2VASc = 5 (not currently on [place] 2/2 h/o GIB/ischemic colitis); c. 09/2014 Echo: EF 60-65%, imparied relaxation, nl RV size/fxn.  . Transient cerebral ischemia due to atrial fibrillation [facility])     Past Surgical History:  Procedure Laterality Date  . ABDOMINAL HYSTERECTOMY    . APPENDECTOMY    . BACK SURGERY     x 3, last 2004.  . CHOLECYSTECTOMY    . COLONOSCOPY WITH PROPOFOL N/A 03/20/2017   Procedure: COLONOSCOPY WITH PROPOFOL;  Surgeon: Jonathon Bellows, MD;  Location: [facility] ENDOSCOPY;  Service: Endoscopy;  Laterality: N/A;  . ESOPHAGOGASTRODUODENOSCOPY (EGD) WITH PROPOFOL N/A 03/20/2017   Procedure: ESOPHAGOGASTRODUODENOSCOPY (EGD) WITH PROPOFOL;  Surgeon: Jonathon Bellows, MD;  Location: [facility]  ENDOSCOPY;  Service: Endoscopy;  Laterality: N/A;  . FOOT SURGERY Right   . HEMORRHOID BANDING    . left total hip arthroplasty  2012  . STOMACH SURGERY    . TONSILLECTOMY      There were no vitals filed for this visit.  Subjective Assessment - 01/21/18 0938    Subjective  Patient was more upbeat today.            ADULT SLP TREATMENT - 01/21/18 0001      General Information   Behavior/Cognition  Alert;Cooperative;Pleasant mood;Requires cueing    HPI  Patient is a 77 y/o woman with left temproal lobe hemorrhage on 09/01/17.      Treatment Provided   Treatment provided  Cognitive-Linquistic      Pain Assessment   Pain Assessment  No/denies pain      Cognitive-Linquistic Treatment   Treatment focused on  Aphasia    Skilled Treatment  VERBAL EXPRESSION: Read 3 word phrases independently with 85% accuracy.  Name pictured object given written phrase completion cue with 40% accuracy, improves to 60% given phonemic cue and 95% given written word.  Name common object with 15% accuracy; improves to 65% accuracy given semantic/phonemic cues. COMPREHENSION:  Complete 3 unit processing tasks with 75% accuracy given repetition and emphasis.  Identify object (f=10) with 100% accuracy with no repetition of stimulus.  Follow 2-unit commands with visual  stimuli with 50%, no cues.      Assessment / Recommendations / Plan   Plan  Continue with current plan of care      Progression Toward Goals   Progression toward goals  Progressing toward goals       SLP Education - 01/21/18 0939    Education provided  Yes    Education Details  listen to yourself, be sure you are using "real" words    Person(s) Educated  Patient    Methods  Explanation    Comprehension  Verbalized understanding         SLP Long Term Goals - 01/21/18 0943      SLP LONG TERM GOAL #2   Title  Patient will complete 3 unit processing tasks with 80% accuracy without the need of repetition of task instructions or  significant delays in responding.    Time  8    Period  Weeks    Status  Partially Met    Target Date  03/20/18      SLP LONG TERM GOAL #3   Title  Patient will name common objects with 50% accuracy.    Time  8    Period  Weeks    Status  Partially Met    Target Date  03/20/18      SLP LONG TERM GOAL #5   Title  Patient will generate meaningful phrase to complete simple/concrete linguistic task with 80% accuracy.    Time  8    Period  Weeks    Status  Partially Met    Target Date  03/20/18      SLP LONG TERM GOAL #6   Title  Patient will read aloud and follow written commands with 80% accuracy    Time  8    Period  Weeks    Status  Partially Met    Target Date  03/20/18      SLP LONG TERM GOAL #7   Title  Patient will name pictured object given written phrase completion stimulus with 80% accuracy.    Time  8    Period  Weeks    Status  Partially Met    Target Date  03/20/18       Plan - 01/21/18 0941    Clinical Impression Statement  The patient is generating more intelligible and appropriate speech as her auditory comprehension is improving.  The patient continues to improve in overall communicative effectiveness.  She continues to display phonemic paraphasias, neologisms, and perseverations in open-ended verbal expression tasks.  She will benefit from continued speech therapy to address communication.      Speech Therapy Frequency  2x / week    Duration  Other (comment) 8 weeks    Treatment/Interventions  Language facilitation;Multimodal communcation approach;SLP instruction and feedback;Patient/family education    Potential to Achieve Goals  Good    Potential Considerations  Ability to learn/carryover information;Co-morbidities;Cooperation/participation level;Medical prognosis;Pain level;Previous level of function;Severity of impairments;Family/community support    SLP Home Exercise Plan  read aloud 3- and 4-letter words    Consulted and Agree with Plan of Care  Patient        Patient will benefit from skilled therapeutic intervention in order to improve the following deficits and impairments:   Aphasia - Plan: SLP plan of care cert/re-cert    Problem List Patient Active Problem List   Diagnosis Date Noted  . Gait disturbance, post-stroke 09/04/2017  . Wernicke's aphasia 09/04/2017  . Left temporal lobe hemorrhage (  [place]) -  left temporal moderate ICH and left frontal trace SDH, concerning for traumatic ICH due to fall. However, needs to rule out CAA, tumor or CVT (vein of labbe)  09/01/2017  . Constipation 01/06/2017  . DJD (degenerative joint disease) of knee 07/18/2016  . GIB (gastrointestinal bleeding) 05/25/2016  . GI bleed 05/24/2016  . Degenerative joint disease (DJD) of hip 05/08/2016  . 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 Hypertensive heart disease   . Hemorrhage of gastrointestinal tract 11/13/2015  . Lumbosacral facet joint syndrome 07/26/2015  . Angioedema 07/13/2015  . DM2 (diabetes mellitus, type 2) ([place]) 07/13/2015  . Status post lumbar laminectomy 06/19/2015  . Lumbar radiculopathy 06/19/2015  . DDD (degenerative disc disease), lumbar 05/11/2015  . Facet syndrome, lumbar 05/11/2015  . Lumbar post-laminectomy syndrome 05/11/2015  . Sacroiliac joint disease 05/11/2015  . 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 Neuropathy due to secondary diabetes ([place]) 05/11/2015  . Essential hypertension 01/03/2015  . Coronary atherosclerosis of native coronary artery   . PAF (paroxysmal atrial fibrillation) ([place]) 10/20/2014  . Ischemic colitis ([place]) 10/20/2014  . Diabetes mellitus type 2 with complications ([place]) 51/01/5851  . COPD (chronic obstructive pulmonary disease) ([place]) 10/20/2014  . Hyperlipidemia 10/20/2014  . GERD (gastroesophageal reflux disease) 10/20/2014  . Ingrowing nail, right great toe 04/27/2014  . Internal hemorrhoid, bleeding 07/14/2013   Leroy Sea, MS/CCC- SLP  Lou Miner 01/21/2018, 9:48 AM  [place] MAIN [facility] SERVICES [address] [place], [place], [address] Phone: [phone]   Fax:  [phone]   Name: RHYANNA SORCE MRN: [id number] Date of Birth: September 01, 1941

## 2018-01-22 ENCOUNTER — Ambulatory Visit: Payer: Medicare Other | Admitting: Occupational Therapy

## 2018-01-22 ENCOUNTER — Encounter: Payer: Self-pay | Admitting: Occupational Therapy

## 2018-01-22 ENCOUNTER — Ambulatory Visit: Payer: Medicare Other | Admitting: Speech Pathology

## 2018-01-22 ENCOUNTER — Encounter: Payer: Self-pay | Admitting: Speech Pathology

## 2018-01-22 ENCOUNTER — Ambulatory Visit: Payer: Medicare Other

## 2018-01-22 DIAGNOSIS — R2681 Unsteadiness on feet: Secondary | ICD-10-CM

## 2018-01-22 DIAGNOSIS — M6281 Muscle weakness (generalized): Secondary | ICD-10-CM

## 2018-01-22 DIAGNOSIS — R4701 Aphasia: Secondary | ICD-10-CM

## 2018-01-22 DIAGNOSIS — R278 Other lack of coordination: Secondary | ICD-10-CM

## 2018-01-22 NOTE — Therapy (Signed)
[place] MAIN [facility] SERVICES [address] [place], [place], [address] Phone: [phone]   Fax:  [phone]  Physical Therapy Treatment  Patient Details  Name: Yvette Collier MRN: [id number] Date of Birth: 1941-03-27 Referring Provider: BURNS, HARRIETT P   Encounter Date: 01/22/2018  PT End of Session - 01/22/18 1211    Visit Number  24    Number of Visits  41    Date for PT Re-Evaluation  02/03/18    PT Start Time  1884    PT Stop Time  1055    PT Time Calculation (min)  40 min    Equipment Utilized During Treatment  Gait belt    Activity Tolerance  Patient tolerated treatment well;Other (comment) Easily distracted    Behavior During Therapy  [facility] for tasks assessed/performed       Past Medical History:  Diagnosis Date  . Asthma   . Chronic lower back pain   . COPD (chronic obstructive pulmonary disease) ([place])   . Diabetes mellitus without complication ([place])   . Gastrointestinal bleed   . GERD (gastroesophageal reflux disease)   . History of colon polyps   . Hypertension   . Hypertensive heart disease   . IBS (irritable bowel syndrome)   . Ischemic colitis ([place])   . Non-obstructive Coronary Artery Disease    a. 05/2009 [facility]): minimal nonobs dzs.  . Osteoarthritis   . PAF (paroxysmal atrial fibrillation) ([place])    a. 09/2014 during GI illness;  b. CHA2DS2VASc = 5 (not currently on [place] 2/2 h/o GIB/ischemic colitis); c. 09/2014 Echo: EF 60-65%, imparied relaxation, nl RV size/fxn.  . Transient cerebral ischemia due to atrial fibrillation [facility])     Past Surgical History:  Procedure Laterality Date  . ABDOMINAL HYSTERECTOMY    . APPENDECTOMY    . BACK SURGERY     x 3, last 2004.  . CHOLECYSTECTOMY    . COLONOSCOPY WITH PROPOFOL N/A 03/20/2017   Procedure: COLONOSCOPY WITH PROPOFOL;  Surgeon: Jonathon Bellows, MD;  Location: [facility] ENDOSCOPY;  Service: Endoscopy;  Laterality: N/A;  . ESOPHAGOGASTRODUODENOSCOPY (EGD) WITH PROPOFOL N/A  03/20/2017   Procedure: ESOPHAGOGASTRODUODENOSCOPY (EGD) WITH PROPOFOL;  Surgeon: Jonathon Bellows, MD;  Location: [facility] ENDOSCOPY;  Service: Endoscopy;  Laterality: N/A;  . FOOT SURGERY Right   . HEMORRHOID BANDING    . left total hip arthroplasty  2012  . STOMACH SURGERY    . TONSILLECTOMY      There were no vitals filed for this visit.  Subjective Assessment - 01/22/18 1024    Subjective  Patient and daughter report no difficulties in mobility at home or LOB.     Pertinent History  Yvette Collier a 77 y.o.right handed femalewith history of hypertension,[name],diabetes mellitus, COPD. Patient lives alone independent prior to admission and works as a Theme park manager. One level home. Admitted 09/01/2017 with altered mental statusright side weaknessas well as aphasia. CT of the head showed a large acute left temporal lobe hematoma approximately 15.9 mL suspect hypertensive hemorrhage.Pt presents today with fluent aphasia.    Limitations  Walking    How long can you sit comfortably?  30 minutes before needing to shift positions    How long can you stand comfortably?  about 30 minutes before needing to sit and rest    How long can you walk comfortably?  states that she has no issues with walking    Patient Stated Goals  "  get stronger"    Currently in Pain?  No/denies       [facility] PT Assessment - 01/22/18 0001      Standardized Balance Assessment   Standardized Balance Assessment  Dynamic Gait Index;Berg Balance Test    Balance Master Testing  --      Berg Balance Test   Sit to Stand  Able to stand without using hands and stabilize independently    Standing Unsupported  Able to stand safely 2 minutes    Sitting with Back Unsupported but Feet Supported on Floor or Stool  Able to sit safely and securely 2 minutes    Stand to Sit  Sits safely with minimal use of hands    Transfers  Able to transfer safely, minor use of hands    Standing Unsupported with  Eyes Closed  Able to stand 10 seconds safely    Standing Ubsupported with Feet Together  Able to place feet together independently and stand 1 minute safely    From Standing, Reach Forward with Outstretched Arm  Can reach forward >12 cm safely (5")    From Standing Position, Pick up Object from Floor  Able to pick up shoe safely and easily    From Standing Position, Turn to Look Behind Over each Shoulder  Looks behind from both sides and weight shifts well    Turn 360 Degrees  Able to turn 360 degrees safely in 4 seconds or less    Standing Unsupported, Alternately Place Feet on Step/Stool  Able to stand independently and safely and complete 8 steps in 20 seconds    Standing Unsupported, One Foot in Front  Able to place foot tandem independently and hold 30 seconds    Standing on One Leg  Able to lift leg independently and hold 5-10 seconds    Total Score  54      Dynamic Gait Index   Level Surface  Normal    Change in Gait Speed  Normal    Gait with Horizontal Head Turns  Mild Impairment    Gait with Vertical Head Turns  Normal    Gait and Pivot Turn  Mild Impairment    Step Over Obstacle  Normal    Step Around Obstacles  Mild Impairment    Steps  Mild Impairment    Total Score  20         Nustep lvl 4 3 minutes  5x STS: 15 seconds, had to have PT pantomime  DGI: 20/24 10 MWT= 1.3 m/s BERG = 54/56 R SLS: 10 seconds  L SLS: 8 seconds LE strength= 4+/5  Heel raises 20x  Airex balance beam side step 6x, tandem walk 6x  Patient and daughter educated on D/c POC.                      PT Education - 01/22/18 1025    Education provided  Yes    Education Details  POC, D/C, continue mobility after D/c    Person(s) Educated  Patient    Methods  Explanation;Demonstration;Verbal cues    Comprehension  Verbalized understanding;Returned demonstration       PT Short Term Goals - 01/22/18 1029      PT SHORT TERM GOAL #1   Title  Pt will improve 5x sit to stand  to 16 seconds in order to demonstrate improved LE strength and improved dynamic balance to decrease risk for falls.    Baseline  18 sec, 10/15/17: 21.41 s,  22.48 sec 1/31: 15 seconds    Time  6    Period  Weeks    Status  Achieved      PT SHORT TERM GOAL #2   Title  Pt will improve gait speed to at least 0.8 m/s, demonstrating improved LE strength in order to safely ambulate with decreased risk for fals.    Baseline  0.688 m/s, 10/15/17: 1.0 m/s, , . 62 m/sec    Time  6    Period  Weeks    Status  Achieved      PT SHORT TERM GOAL #3   Title  Pt will improve TUG to at least 16 seconds in order to demonstrate improve LE strength and dynamic balance.    Baseline  19 sec; 10/15/17: 11.23s,     Time  6    Period  Weeks    Status  Achieved      PT SHORT TERM GOAL #4   Title  Pt will improve Berg Balance score to 46/56 in order to demonstarte improved dynamic and static balance in order to decrease overall falls risk.    Baseline  41/56; 51/56    Time  6    Period  Weeks    Status  Achieved      PT SHORT TERM GOAL #5   Title  Patient will increase dynamic gait index score to >15/24 as to demonstrate reduced fall risk and improved dynamic gait balance for better safety with community/home ambulation.     Baseline  13/24: 1/31: 20/24    Time  2    Period  Weeks    Status  Achieved        PT Long Term Goals - 01/22/18 1029      PT LONG TERM GOAL #1   Title  Pt will demonstrate independence with HEP in order to continue LE strengthening and balance interventions at home to manage symptoms and decrease overall falls risk.    Baseline  HEP compliant    Time  12    Period  Weeks    Status  Achieved      PT LONG TERM GOAL #2   Title  Pt will improve 5x sit to stand to <15 seconds in order to demonstrate improved LE strength and improved balance in order to decrease falls risk.    Baseline  18 sec; 10/15/17: 21.41s, 22.48; 1/31: 15 seconds    Time  12    Period  Weeks    Status   Achieved      PT LONG TERM GOAL #3   Title  Pt will improve gait speed to at least 1.0 m/s in order to demonstrate improved LE strength and balance in order to ambulate with decreased risk for falls.    Baseline  0.688 m/s; 10/15/17: 1.0 m/s     Time  12    Period  Weeks    Status  Achieved      PT LONG TERM GOAL #4   Title  Pt will demonstrate an improved TUG score to < or equal to 14 seconds in order to demonstrate improved LE strength and balance in order to decrease overall falls risk.    Baseline  19 sec; 10/15/17: 11.23s    Time  12    Period  Weeks    Status  Achieved      PT LONG TERM GOAL #5   Title  Pt will demonstrate an improved Berg Balance score to at  least 51/56 in order to demonstrate improved dynamic and static balance activities.    Baseline  41/56; 10/15/17: 51/56    Time  12    Period  Weeks    Status  Achieved      PT LONG TERM GOAL #6   Title  Pt will improve LE strength to 4+/5 in all limited planes in order to increase funtional abilities and improve gait.    Baseline  4+/5 bilaterally     Time  12    Period  Weeks    Status  Achieved      PT LONG TERM GOAL #7   Title  Pt will improve gait speed to at least 1.2 m/s in order to demonstrate improved LE strength and balance in order to ambulate with decreased risk for falls.    Baseline  0.688 m/s; 10/15/17: 1.0 m/s, .62 /sec 12/18: .67 m/s : 1/311.3 m/s    Time  8    Period  Weeks    Status  Achieved      PT LONG TERM GOAL #8   Title  Pt will demonstrate an improved Berg Balance score to at least 54/56 in order to demonstrate improved dynamic and static balance activities.    Baseline  41/56; 10/15/17: 51/56 12/18: 52/56; 1/31: 54/56    Time  8    Period  Weeks    Status  Achieved      PT LONG TERM GOAL  #9   TITLE  Patient will increase dynamic gait index score to >19/24 as to demonstrate reduced fall risk and improved dynamic gait balance for better safety with community/home ambulation.      Baseline  12/18: 13/24; 1/31: 20/24    Time  8    Period  Weeks    Status  Achieved            Plan - 01/22/18 1213    Clinical Impression Statement  Patient met all goals at this time, 5x STS= 15 seconds, DGI= 20/24, 10 MWT=1.3 m/s, BERG 54/56. Patient and daughter agreeable to discharge due to patient mobility status. I will be happy to see patient again in the future as needed.     Rehab Potential  Good    PT Frequency  2x / week    PT Duration  8 weeks    PT Treatment/Interventions  Aquatic Therapy;Cryotherapy;Moist Heat;Gait training;Stair training;Functional mobility training;Therapeutic activities;Therapeutic exercise;Balance training;Neuromuscular re-education;Patient/family education;Manual techniques;Passive range of motion    PT Next Visit Plan  begin LE strength and dynamic balance activities    PT Home Exercise Plan  seated marches, seated hip ABD with red theraband    Consulted and Agree with Plan of Care  Patient       Patient will benefit from skilled therapeutic intervention in order to improve the following deficits and impairments:  Abnormal gait, Decreased coordination, Decreased activity tolerance, Decreased balance, Decreased cognition, Decreased mobility, Decreased safety awareness, Decreased strength, Difficulty walking  Visit Diagnosis: Muscle weakness (generalized)  Other lack of coordination  Unsteadiness on feet     Problem List Patient Active Problem List   Diagnosis Date Noted  . Gait disturbance, post-stroke 09/04/2017  . Wernicke's aphasia 09/04/2017  . 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 However, needs to rule out CAA, tumor or CVT (vein of labbe)  09/01/2017  . Constipation 01/06/2017  . DJD (degenerative joint disease) of  knee 07/18/2016  . GIB (gastrointestinal bleeding) 05/25/2016  . GI bleed 05/24/2016  . Degenerative joint disease  (DJD) of hip 05/08/2016  . 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 Hypertensive heart disease   . Hemorrhage of gastrointestinal tract 11/13/2015  . Lumbosacral facet joint syndrome 07/26/2015  . Angioedema 07/13/2015  . DM2 (diabetes mellitus, type 2) ([place]) 07/13/2015  . Status post lumbar laminectomy 06/19/2015  . Lumbar radiculopathy 06/19/2015  . DDD (degenerative disc disease), lumbar 05/11/2015  . Facet syndrome, lumbar 05/11/2015  . Lumbar post-laminectomy syndrome 05/11/2015  . Sacroiliac joint disease 05/11/2015  . 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 Neuropathy due to secondary diabetes ([place]) 05/11/2015  . Essential hypertension 01/03/2015  . Coronary atherosclerosis of native coronary artery   . PAF (paroxysmal atrial fibrillation) ([place]) 10/20/2014  . Ischemic colitis ([place]) 10/20/2014  . Diabetes mellitus type 2 with complications ([place]) 90/22/8406  . COPD (chronic obstructive pulmonary disease) ([place]) 10/20/2014  . Hyperlipidemia 10/20/2014  . GERD (gastroesophageal reflux disease) 10/20/2014  . Ingrowing nail, right great toe 04/27/2014  . Internal hemorrhoid, bleeding 07/14/2013   Janna Arch, PT, DPT   Janna Arch 01/22/2018, 12:15 PM  [place] MAIN [facility] SERVICES [address] [place], [place], [address] Phone: [phone]   Fax:  [phone]  Name: DANDRIA GRIEGO MRN: [id number] Date of Birth: June 22, 1941

## 2018-01-22 NOTE — Therapy (Signed)
[place] MAIN [facility] SERVICES [address] [place], [place], [address] Phone: [phone]   Fax:  [phone]  Speech Language Pathology Treatment  Patient Details  Name: Yvette Collier MRN: [id number] Date of Birth: 06-02-1941 Referring Provider: Dr. Letta Pate   Encounter Date: 01/22/2018  End of Session - 01/22/18 1248    Visit Number  20    Number of Visits  35    Date for SLP Re-Evaluation  03/20/18    SLP Start Time  16    SLP Stop Time   1000    SLP Time Calculation (min)  45 min    Activity Tolerance  Patient tolerated treatment well       Past Medical History:  Diagnosis Date  . Asthma   . Chronic lower back pain   . COPD (chronic obstructive pulmonary disease) ([place])   . Diabetes mellitus without complication ([place])   . Gastrointestinal bleed   . GERD (gastroesophageal reflux disease)   . History of colon polyps   . Hypertension   . Hypertensive heart disease   . IBS (irritable bowel syndrome)   . Ischemic colitis ([place])   . Non-obstructive Coronary Artery Disease    a. 05/2009 Cath [facility]): minimal nonobs dzs.  . Osteoarthritis   . PAF (paroxysmal atrial fibrillation) ([place])    a. 09/2014 during GI illness;  b. CHA2DS2VASc = 5 (not currently on [place] 2/2 h/o GIB/ischemic colitis); c. 09/2014 Echo: EF 60-65%, imparied relaxation, nl RV size/fxn.  . Transient cerebral ischemia due to atrial fibrillation [facility])     Past Surgical History:  Procedure Laterality Date  . ABDOMINAL HYSTERECTOMY    . APPENDECTOMY    . BACK SURGERY     x 3, last 2004.  . CHOLECYSTECTOMY    . COLONOSCOPY WITH PROPOFOL N/A 03/20/2017   Procedure: COLONOSCOPY WITH PROPOFOL;  Surgeon: Jonathon Bellows, MD;  [facility] ENDOSCOPY;  Service: Endoscopy;  Laterality: N/A;  . ESOPHAGOGASTRODUODENOSCOPY (EGD) WITH PROPOFOL N/A 03/20/2017   Procedure: ESOPHAGOGASTRODUODENOSCOPY (EGD) WITH PROPOFOL;  Surgeon: Jonathon Bellows, MD;  Location: [facility] ENDOSCOPY;  Service:  Endoscopy;  Laterality: N/A;  . FOOT SURGERY Right   . HEMORRHOID BANDING    . left total hip arthroplasty  2012  . STOMACH SURGERY    . TONSILLECTOMY      There were no vitals filed for this visit.  Subjective Assessment - 01/22/18 1247    Subjective  Patient was more upbeat today.            ADULT SLP TREATMENT - 01/22/18 0001      General Information   Behavior/Cognition  Alert;Cooperative;Pleasant mood;Requires cueing    HPI  Patient is a 77 y/o woman with left temproal lobe hemorrhage on 09/01/17.      Treatment Provided   Treatment provided  Cognitive-Linquistic      Pain Assessment   Pain Assessment  No/denies pain      Cognitive-Linquistic Treatment   Treatment focused on  Aphasia    Skilled Treatment  VERBAL EXPRESSION: Read 3 word phrases independently with 85% accuracy.  Name pictured object given written phrase completion cue with 40% accuracy, improves to 60% given phonemic cue and 95% given written word.  Name common object with 15% accuracy; improves to 75% accuracy given semantic/phonemic cues.       Assessment / Recommendations / Plan   Plan  Continue with current plan of care      Progression Toward Goals   Progression  toward goals  Progressing toward goals       SLP Education - 01/22/18 1247    Education provided  Yes    Education Details  slow down, listen    Person(s) Educated  Patient    Methods  Explanation    Comprehension  Verbalized understanding         SLP Long Term Goals - 01/21/18 0943      SLP LONG TERM GOAL #2   Title  Patient will complete 3 unit processing tasks with 80% accuracy without the need of repetition of task instructions or significant delays in responding.    Time  8    Period  Weeks    Status  Partially Met    Target Date  03/20/18      SLP LONG TERM GOAL #3   Title  Patient will name common objects with 50% accuracy.    Time  8    Period  Weeks    Status  Partially Met    Target Date  03/20/18       SLP LONG TERM GOAL #5   Title  Patient will generate meaningful phrase to complete simple/concrete linguistic task with 80% accuracy.    Time  8    Period  Weeks    Status  Partially Met    Target Date  03/20/18      SLP LONG TERM GOAL #6   Title  Patient will read aloud and follow written commands with 80% accuracy    Time  8    Period  Weeks    Status  Partially Met    Target Date  03/20/18      SLP LONG TERM GOAL #7   Title  Patient will name pictured object given written phrase completion stimulus with 80% accuracy.    Time  8    Period  Weeks    Status  Partially Met    Target Date  03/20/18       Plan - 01/22/18 1248    Clinical Impression Statement  The patient is generating more intelligible and appropriate speech as her auditory comprehension is improving.  The patient continues to improve in overall communicative effectiveness.  She continues to display phonemic paraphasias, neologisms, and perseverations in open-ended verbal expression tasks.       Speech Therapy Frequency  2x / week    Duration  Other (comment)    Treatment/Interventions  Language facilitation;Multimodal communcation approach;SLP instruction and feedback;Patient/family education    Potential to Achieve Goals  Good    Potential Considerations  Ability to learn/carryover information;Co-morbidities;Cooperation/participation level;Medical prognosis;Pain level;Previous level of function;Severity of impairments;Family/community support    SLP Home Exercise Plan  read aloud 3- and 4-letter words       Patient will benefit from skilled therapeutic intervention in order to improve the following deficits and impairments:   Aphasia    Problem List Patient Active Problem List   Diagnosis Date Noted  . Gait disturbance, post-stroke 09/04/2017  . Wernicke's aphasia 09/04/2017  . Left temporal lobe hemorrhage (HCC) -  left temporal moderate ICH and left frontal trace SDH, concerning for traumatic ICH due to  fall. However, needs to rule out CAA, tumor or CVT (vein of labbe)  09/01/2017  . Constipation 01/06/2017  . DJD (degenerative joint disease) of knee 07/18/2016  . GIB (gastrointestinal bleeding) 05/25/2016  . GI bleed 05/24/2016  . Degenerative joint disease (DJD) of hip 05/08/2016  . 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 Hypertensive  heart disease   . Hemorrhage of gastrointestinal tract 11/13/2015  . Lumbosacral facet joint syndrome 07/26/2015  . Angioedema 07/13/2015  . DM2 (diabetes mellitus, type 2) ([place]) 07/13/2015  . Status post lumbar laminectomy 06/19/2015  . Lumbar radiculopathy 06/19/2015  . DDD (degenerative disc disease), lumbar 05/11/2015  . Facet syndrome, lumbar 05/11/2015  . Lumbar post-laminectomy syndrome 05/11/2015  . Sacroiliac joint disease 05/11/2015  . 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 Neuropathy due to secondary diabetes ([place]) 05/11/2015  . Essential hypertension 01/03/2015  . Coronary atherosclerosis of native coronary artery   . PAF (paroxysmal atrial fibrillation) ([place]) 10/20/2014  . Ischemic colitis ([place]) 10/20/2014  . Diabetes mellitus type 2 with complications ([place]) 05/23/5614  . COPD (chronic obstructive pulmonary disease) ([place]) 10/20/2014  . Hyperlipidemia 10/20/2014  . GERD (gastroesophageal reflux disease) 10/20/2014  . Ingrowing nail, right great toe 04/27/2014  . Internal hemorrhoid, bleeding 07/14/2013   Leroy Sea, MS/CCC- SLP  Lou Miner 01/22/2018, 12:49 PM  [place] MAIN [facility] SERVICES [address] [place], [place], [address] Phone: [phone]   Fax:  [phone]   Name: PAYDEN BONUS MRN: [id number] Date of Birth: 12-Aug-1941

## 2018-01-22 NOTE — Therapy (Signed)
[place] [facility] MAIN [facility] SERVICES [address] [place], [place], [address] Phone: [phone]   Fax:  [phone]  Occupational Therapy Treatment  Patient Details  Name: Yvette Collier MRN: [id number] Date of Birth: [birth year] Referring Provider: BURNS, HARRIETT P   Encounter Date: 01/22/2018  OT End of Session - 01/22/18 1059    Visit Number  23    Number of Visits  48    Date for OT Re-Evaluation  03/03/18    OT Start Time  1058    OT Stop Time  1143    OT Time Calculation (min)  45 min    Activity Tolerance  Patient tolerated treatment well    Behavior During Therapy  [facility] for tasks assessed/performed       Past Medical History:  Diagnosis Date  . Asthma   . Chronic lower back pain   . COPD (chronic obstructive pulmonary disease) (HCC)   . Diabetes mellitus without complication (HCC)   . Gastrointestinal bleed   . GERD (gastroesophageal reflux disease)   . History of colon polyps   . Hypertension   . Hypertensive heart disease   . IBS (irritable bowel syndrome)   . Ischemic colitis (HCC)   . Non-obstructive Coronary Artery Disease    a. 05/2009 Cath [facility]): minimal nonobs dzs.  . Osteoarthritis   . PAF (paroxysmal atrial fibrillation) (HCC)    a. 09/2014 during GI illness;  b. CHA2DS2VASc = 5 (not currently on OAC 2/2 h/o GIB/ischemic colitis); c. 09/2014 Echo: EF 60-65%, imparied relaxation, nl RV size/fxn.  . Transient cerebral ischemia due to atrial fibrillation [facility])     Past Surgical History:  Procedure Laterality Date  . ABDOMINAL HYSTERECTOMY    . APPENDECTOMY    . BACK SURGERY     x 3, last 2004.  . CHOLECYSTECTOMY    . COLONOSCOPY WITH PROPOFOL N/A 03/20/2017   Procedure: COLONOSCOPY WITH PROPOFOL;  Surgeon: Wyline Mood, MD;  Location: [facility] ENDOSCOPY;  Service: Endoscopy;  Laterality: N/A;  . ESOPHAGOGASTRODUODENOSCOPY (EGD) WITH PROPOFOL N/A 03/20/2017   Procedure: ESOPHAGOGASTRODUODENOSCOPY (EGD) WITH PROPOFOL;   Surgeon: Wyline Mood, MD;  Location: [facility] ENDOSCOPY;  Service: Endoscopy;  Laterality: N/A;  . FOOT SURGERY Right   . HEMORRHOID BANDING    . left total hip arthroplasty  2012  . STOMACH SURGERY    . TONSILLECTOMY      There were no vitals filed for this visit.  Subjective Assessment - 01/22/18 1058    Subjective   Pt. has finished with PT services.    Patient is accompained by:  Family member    Pertinent History  Pt. is a 77 y.o. female who suffered a Left temporla Lobe Hemorrhage secondary to Hypertensive Crisis, Moderate ICH, Left Frontal Trace SDH, and Traumatic ICH secondary to a fall. Pt. PMHX includes: HTN, DM with peripheral Neuropathy, Acute Kidney Injury, Hyperlipdidemia, and history of a GI Bleed. Pt. went the inpatient rehab at [facility], was discharged, and now is ready for outpatient rehab services.    Patient Stated Goals  Patient reports she would like to do as much as she can for herself.    Currently in Pain?  No/denies      OT TREATMENT    Neuro muscular re-education:  Pt. Worked on sorting cards by numbers.  Pt. Requires cues for sorting face cards.  Therapeutic Exercise:  Pt. performed 2# dowel ex. For UE strengthening secondary to weakness. Bilateral shoulder flexion, chest press, circular patterns,  and elbow flexion/extension were performed. 2# dumbbell ex. for elbow flexion and extension, for forearm supination/pronation, wrist flexion/extension, and radial deviation. Pt. requires rest breaks and verbal cues for proper technique.                          OT Education - 01/22/18 1059    Education provided  Yes    Education Details  UE ther. ex, and [facility] 2    Person(s) Educated  Patient    Methods  Explanation;Verbal cues;Demonstration    Comprehension  Verbalized understanding         OT Long Term Goals - 12/09/17 0908      OT LONG TERM GOAL #1   Title  Pt. will be independent with basic ADL care needs.    Baseline  Eval: Pt.  requires assist, 11/13: Pt. requires minA UE, ModA LE, 12/09/2018: Pt. conitnues to require assist.    Time  12    Period  Weeks    Status  On-going    Target Date  03/03/18      OT LONG TERM GOAL #2   Title  Pt. will be independent with light meal preparation.    Baseline  Eval: Pt. is unable, 11/04/2017: Pt. is able to assist with light cold meal prep, ahowver requires assist,. 12/09/2018: Pt. continues to require assist    Time  12    Period  Weeks    Status  On-going    Target Date  03/03/18      OT LONG TERM GOAL #3   Title  Pt. will be independent with tub/shower transfers    Baseline  Eval: Pt. requires assist, 11/04/2017: pt. conitnues to require assist using a shower seat.    Time  12    Period  Weeks    Status  On-going    Target Date  03/03/18      OT LONG TERM GOAL #4   Title  Pt. will increase BUE strength by 2 mm grades to assist with ADLs.    Baseline  Eval: BUE strength 4/5 overall. 12/09/2018: BUEs: shoulder flexion, and abduction 4/5, elbow flexion/extension: 4+/5    Time  12    Period  Weeks    Status  On-going    Target Date  03/03/18      OT LONG TERM GOAL #5   Title  Pt. will improve right [facility] skills to be able to assist with buttoning, and zipping.     Baseline  Eval: impaired, 11/04/2017:  Pt. continues to require work on improving [facility] skills for buttnoning, and zipping clothing. 12/08/2017: Pt. continues to require assist    Time  12    Period  Weeks    Status  On-going    Target Date  03/03/18      OT LONG TERM GOAL #6   Title  Pt. will improve right grip strength to be able to open containers.    Baseline  Eval: Limited. 11/04/2017: improving, 12/09/2017: Pt. continues to have difficulty    Time  12    Period  Weeks    Status  On-going    Target Date  03/03/18      OT LONG TERM GOAL #7   Title  Pt. will identify potential safety hazards accurately during ADLs, and IADLs with 100% accuracy.    Baseline  Eval: limited 11/04/2017: Conitnues to  be impaired, 12/09/2017: Pt. requires frequent cues.    Time  12    Period  Weeks    Status  On-going    Target Date  03/03/18      OT LONG TERM GOAL #8   Title  Pt. will demonstrate cognitive compensatory techniques 100% of the time with minimal cues.    Baseline  Eval: limite11/13/2018: conitnues to be limited. 12/09/2018: Pt. continues to require assist.    Time  12    Period  Weeks    Target Date  03/03/18            Plan - 01/22/18 1100    Clinical Impression Statement  Pt. continues to make progress overall. Pt. continues to require verbal cues, and visual demonstration for proper technique. Pt. continues to present with limited UE functioning, and cognitive functioning. Pt. conitnues to work on improving UE strength, coordination, and cognition for safe ADL, and IADL functioning.    Occupational performance deficits (Please refer to evaluation for details):  ADL's;IADL's    Rehab Potential  Good    OT Frequency  2x / week    OT Duration  12 weeks    OT Treatment/Interventions  Self-care/ADL training;Therapeutic activities;Therapeutic exercise    Clinical Decision Making  Limited treatment options, no task modification necessary    Consulted and Agree with Plan of Care  Patient       Patient will benefit from skilled therapeutic intervention in order to improve the following deficits and impairments:  Abnormal gait, Decreased activity tolerance, Decreased cognition, Decreased balance, Decreased strength, Impaired UE functional use, Decreased coordination  Visit Diagnosis: Muscle weakness (generalized)  Other lack of coordination    Problem List Patient Active Problem List   Diagnosis Date Noted  . Gait disturbance, post-stroke 09/04/2017  . Wernicke's aphasia 09/04/2017  . 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 However, needs to rule out CAA, tumor or CVT (vein of  labbe)  09/01/2017  . Constipation 01/06/2017  . DJD (degenerative joint disease) of knee 07/18/2016  . GIB (gastrointestinal bleeding) 05/25/2016  . GI bleed 05/24/2016  . Degenerative joint disease (DJD) of hip 05/08/2016  . 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 Hypertensive heart disease   . Hemorrhage of gastrointestinal tract 11/13/2015  . Lumbosacral facet joint syndrome 07/26/2015  . Angioedema 07/13/2015  . DM2 (diabetes mellitus, type 2) (HCC) 07/13/2015  . Status post lumbar laminectomy 06/19/2015  . Lumbar radiculopathy 06/19/2015  . DDD (degenerative disc disease), lumbar 05/11/2015  . Facet syndrome, lumbar 05/11/2015  . Lumbar post-laminectomy syndrome 05/11/2015  . Sacroiliac joint disease 05/11/2015  . 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 Neuropathy due to secondary diabetes (HCC) 05/11/2015  . Essential hypertension 01/03/2015  . Coronary atherosclerosis of native coronary artery   . PAF (paroxysmal atrial fibrillation) (HCC) 10/20/2014  . Ischemic colitis (HCC) 10/20/2014  . Diabetes mellitus type 2 with complications (HCC) 10/20/2014  . COPD (chronic obstructive pulmonary disease) (HCC) 10/20/2014  . Hyperlipidemia 10/20/2014  . GERD (gastroesophageal reflux disease) 10/20/2014  . Ingrowing nail, right great toe 04/27/2014  . Internal hemorrhoid, bleeding 07/14/2013    Olegario MessierElaine Maloni Musleh, MS, OTR/L 01/22/2018, 11:24 AM  [facility] [address] [place], [place], [address] Phone: [phone]   Fax:  [phone]  Name: Yvette RakersMary L Collier MRN: [id number] Date of Birth: March 01, 1941

## 2018-01-27 ENCOUNTER — Ambulatory Visit: Payer: Medicare Other

## 2018-01-27 ENCOUNTER — Ambulatory Visit: Payer: Medicare Other | Admitting: Occupational Therapy

## 2018-01-29 ENCOUNTER — Ambulatory Visit: Payer: Medicare Other

## 2018-01-29 ENCOUNTER — Encounter: Payer: Self-pay | Admitting: Occupational Therapy

## 2018-01-29 ENCOUNTER — Ambulatory Visit: Payer: Medicare Other | Attending: Physical Medicine & Rehabilitation | Admitting: Occupational Therapy

## 2018-01-29 DIAGNOSIS — M6281 Muscle weakness (generalized): Secondary | ICD-10-CM | POA: Insufficient documentation

## 2018-01-29 DIAGNOSIS — R278 Other lack of coordination: Secondary | ICD-10-CM | POA: Diagnosis present

## 2018-01-29 DIAGNOSIS — R4701 Aphasia: Secondary | ICD-10-CM | POA: Diagnosis present

## 2018-01-29 NOTE — Therapy (Signed)
[facility]IN [facility] [address] [place], [place], [address] Phone: [phone]   Fax:  [phone]  Occupational Therapy Treatment  Patient Details  Name: Yvette RakersMary L Collier MRN: [id number] Date of Birth: [birth year] Referring Provider: BURNS, HARRIETT P   Encounter Date: 01/29/2018  OT End of Session - 01/29/18 0941    Visit Number  24    Number of Visits  48    Date for OT Re-Evaluation  03/03/18    OT Start Time  0935    OT Stop Time  1015    OT Time Calculation (min)  40 min    Activity Tolerance  Patient tolerated treatment well    Behavior During Therapy  [facility] for tasks assessed/performed       Past Medical History:  Diagnosis Date  . Asthma   . Chronic lower back pain   . COPD (chronic obstructive pulmonary disease) (HCC)   . Diabetes mellitus without complication (HCC)   . Gastrointestinal bleed   . GERD (gastroesophageal reflux disease)   . History of colon polyps   . Hypertension   . Hypertensive heart disease   . IBS (irritable bowel syndrome)   . Ischemic colitis (HCC)   . Non-obstructive Coronary Artery Disease    a. 05/2009 [facility]): minimal nonobs dzs.  . Osteoarthritis   . PAF (paroxysmal atrial fibrillation) (HCC)    a. 09/2014 during GI illness;  b. CHA2DS2VASc = 5 (not currently on OAC 2/2 h/o GIB/ischemic colitis); c. 09/2014 Echo: EF 60-65%, imparied relaxation, nl RV size/fxn.  . Transient cerebral ischemia due to atrial fibrillation [facility](HCC)     Past Surgical History:  Procedure Laterality Date  . ABDOMINAL HYSTERECTOMY    . APPENDECTOMY    . BACK SURGERY     x 3, last 2004.  . CHOLECYSTECTOMY    . COLONOSCOPY WITH PROPOFOL N/A 03/20/2017   Procedure: COLONOSCOPY WITH PROPOFOL;  Surgeon: Wyline MoodKiran Anna, MD;  [facility];  Service: Endoscopy;  Laterality: N/A;  . ESOPHAGOGASTRODUODENOSCOPY (EGD) WITH PROPOFOL N/A 03/20/2017   Procedure: ESOPHAGOGASTRODUODENOSCOPY (EGD) WITH PROPOFOL;  Surgeon:  Wyline MoodKiran Anna, MD;  Location: [facility] ENDOSCOPY;  Service: Endoscopy;  Laterality: N/A;  . FOOT SURGERY Right   . HEMORRHOID BANDING    . left total hip arthroplasty  2012  . STOMACH SURGERY    . TONSILLECTOMY      There were no vitals filed for this visit.  Subjective Assessment - 01/29/18 0939    Subjective   Pt. reports she is doing fair.    Patient is accompained by:  Family member    Pertinent History  Pt. is a 77 y.o. female who suffered a Left temporla Lobe Hemorrhage secondary to Hypertensive Crisis, Moderate ICH, Left Frontal Trace SDH, and Traumatic ICH secondary to a fall. Pt. PMHX includes: HTN, DM with peripheral Neuropathy, Acute Kidney Injury, Hyperlipdidemia, and history of a GI Bleed. [facility]. [facility], was discharged, and now is ready for outpatient rehab services.    Currently in Pain?  No/denies       OT TREATMENT    Neuro muscular re-education:  Pt. Worked on grasping coins from a tabletop surface, translatory movements of the hand, placing them into a resistive container, and pushing them through the slot while isolating his 2nd digit. A resistive mat was placed under coins to aide in manipulating the coins and prevent sliding when picking them up. Pt. worked on sorting the coins.  Pt. Worked on sorting number cards. Pt. Requires verbal cues, and assist for for matching, and sorting the cards.                      OT Education - 01/29/18 0940    Education provided  Yes    Education Details  [facility]    Person(s) Educated  Patient    Methods  Explanation    Comprehension  Verbalized understanding         OT Long Term Goals - 12/09/17 0908      OT LONG TERM GOAL #1   Title  Pt. will be independent with basic ADL care needs.    Baseline  Eval: Pt. requires assist, 11/13: Pt. requires minA UE, ModA LE, 12/09/2018: Pt. conitnues to require assist.    Time  12    Period  Weeks    Status  On-going    Target Date  03/03/18       OT LONG TERM GOAL #2   Title  Pt. will be independent with light meal preparation.    Baseline  Eval: Pt. is unable, 11/04/2017: Pt. is able to assist with light cold meal prep, ahowver requires assist,. 12/09/2018: Pt. continues to require assist    Time  12    Period  Weeks    Status  On-going    Target Date  03/03/18      OT LONG TERM GOAL #3   Title  Pt. will be independent with tub/shower transfers    Baseline  Eval: Pt. requires assist, 11/04/2017: pt. conitnues to require assist using a shower seat.    Time  12    Period  Weeks    Status  On-going    Target Date  03/03/18      OT LONG TERM GOAL #4   Title  Pt. will increase BUE strength by 2 mm grades to assist with ADLs.    Baseline  Eval: BUE strength 4/5 overall. 12/09/2018: BUEs: shoulder flexion, and abduction 4/5, elbow flexion/extension: 4+/5    Time  12    Period  Weeks    Status  On-going    Target Date  03/03/18      OT LONG TERM GOAL #5   Title  Pt. will improve right [facility] skills to be able to assist with buttoning, and zipping.     Baseline  Eval: impaired, 11/04/2017:  Pt. continues to require work on improving [facility] skills for buttnoning, and zipping clothing. 12/08/2017: Pt. continues to require assist    Time  12    Period  Weeks    Status  On-going    Target Date  03/03/18      OT LONG TERM GOAL #6   Title  Pt. will improve right grip strength to be able to open containers.    Baseline  Eval: Limited. 11/04/2017: improving, 12/09/2017: Pt. continues to have difficulty    Time  12    Period  Weeks    Status  On-going    Target Date  03/03/18      OT LONG TERM GOAL #7   Title  Pt. will identify potential safety hazards accurately during ADLs, and IADLs with 100% accuracy.    Baseline  Eval: limited 11/04/2017: Conitnues to be impaired, 12/09/2017: Pt. requires frequent cues.    Time  12    Period  Weeks    Status  On-going    Target Date  03/03/18  OT LONG TERM GOAL #8   Title  Pt. will  demonstrate cognitive compensatory techniques 100% of the time with minimal cues.    Baseline  Eval: limite11/13/2018: conitnues to be limited. 12/09/2018: Pt. continues to require assist.    Time  12    Period  Weeks    Target Date  03/03/18            Plan - 01/29/18 0941    Clinical Impression Statement Pt. is engaging in more home management tasks at home. Pt. is assisting with laundry, and dishes. Pt. Requires cues for sorting, and matching cards. Pt. continues to work on improving UE strength, coordination skills, and cognitive functioning.     Occupational Profile and client history currently impacting functional performance  quires cues for safety as she attempts to leave her     Occupational performance deficits (Please refer to evaluation for details):  ADL's;IADL's    Rehab Potential  Good    OT Frequency  2x / week    OT Duration  12 weeks    OT Treatment/Interventions  Self-care/ADL training;Therapeutic activities;Therapeutic exercise    Clinical Decision Making  Limited treatment options, no task modification necessary    Consulted and Agree with Plan of Care  Patient       Patient will benefit from skilled therapeutic intervention in order to improve the following deficits and impairments:  Abnormal gait, Decreased activity tolerance, Decreased cognition, Decreased balance, Decreased strength, Impaired UE functional use, Decreased coordination  Visit Diagnosis: Muscle weakness (generalized)  Other lack of coordination    Problem List Patient Active Problem List   Diagnosis Date Noted  . Gait disturbance, post-stroke 09/04/2017  . Wernicke's aphasia 09/04/2017  . 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 However, needs to rule out CAA, tumor or CVT (vein of labbe)  09/01/2017  . Constipation 01/06/2017  . DJD (degenerative joint disease) of knee 07/18/2016  . GIB  (gastrointestinal bleeding) 05/25/2016  . GI bleed 05/24/2016  . Degenerative joint disease (DJD) of hip 05/08/2016  . 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 Hypertensive heart disease   . Hemorrhage of gastrointestinal tract 11/13/2015  . Lumbosacral facet joint syndrome 07/26/2015  . Angioedema 07/13/2015  . DM2 (diabetes mellitus, type 2) (HCC) 07/13/2015  . Status post lumbar laminectomy 06/19/2015  . Lumbar radiculopathy 06/19/2015  . DDD (degenerative disc disease), lumbar 05/11/2015  . Facet syndrome, lumbar 05/11/2015  . Lumbar post-laminectomy syndrome 05/11/2015  . Sacroiliac joint disease 05/11/2015  . 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 Neuropathy due to secondary diabetes (HCC) 05/11/2015  . Essential hypertension 01/03/2015  . Coronary atherosclerosis of native coronary artery   . PAF (paroxysmal atrial fibrillation) (HCC) 10/20/2014  . Ischemic colitis (HCC) 10/20/2014  . Diabetes mellitus type 2 with complications (HCC) 10/20/2014  . COPD (chronic obstructive pulmonary disease) (HCC) 10/20/2014  . Hyperlipidemia 10/20/2014  . GERD (gastroesophageal reflux disease) 10/20/2014  . Ingrowing nail, right great toe 04/27/2014  . Internal hemorrhoid, bleeding 07/14/2013    Olegario Messier, MS, OTR/L 01/29/2018, 9:55 AM  [facility] MAIN [facility] SERVICES [address] [place], [place], [address] Phone: [phone]   Fax:  [phone]  Name: ANIAYAH ALANIZ MRN: [id number] Date of Birth: 12/29/1940

## 2018-02-02 ENCOUNTER — Ambulatory Visit (INDEPENDENT_AMBULATORY_CARE_PROVIDER_SITE_OTHER): Payer: Medicare Other | Admitting: Diagnostic Neuroimaging

## 2018-02-02 ENCOUNTER — Encounter: Payer: Self-pay | Admitting: Diagnostic Neuroimaging

## 2018-02-02 VITALS — BP 139/77 | HR 65 | Ht 65.0 in | Wt 160.0 lb

## 2018-02-02 DIAGNOSIS — I611 Nontraumatic intracerebral hemorrhage in hemisphere, cortical: Secondary | ICD-10-CM | POA: Diagnosis not present

## 2018-02-02 NOTE — Patient Instructions (Signed)
-   check MRI brain / MRA head  - continue current medications

## 2018-02-02 NOTE — Progress Notes (Signed)
[facility]  PATIENT: Yvette Collier DOB: [birth year]  REFERRING CLINICIAN: Jerel Shepherd, MD HISTORY FROM: patient and chart review  REASON FOR VISIT: new consult    HISTORICAL  CHIEF COMPLAINT:  Chief Complaint  Patient presents with  . Stroke    rm 7, New Pt, hospital FU, dgtr- Yvette Collier    HISTORY OF PRESENT ILLNESS:   77 year old female here for evaluation of left temporal intracerebral hemorrhage.  Patient was admitted to hospital in September 2018.  Patient was at church on Sunday, having difficulty speaking.  She was taken home by friend and patient continued to have problems.  She was found the next day by her daughter on the sofa, with significant confusion and language difficulty.  Patient was admitted to the hospital and found to have a left temporal intracerebral hemorrhage.  This was possibly related to hypertensive crisis.  Initial systolic blood pressure was 175.  Patient was treated with nicardipine drip and neuro ICU admission.  Patient was then transition to inpatient rehabilitation.  Now patient living at home with 24-hour supervision.  Language has improved but she still has deficits with expressive and receptive language functions.  Patient needs help with many of her day-to-day activities.  Patient's daughter is planning to enroll patient into PACE program.  Review of PCP notes mentions diagnosis of "dementia".  Daughter thinks this diagnosis was made prior to patient's intracerebral hemorrhage.    REVIEW OF SYSTEMS: Full 14 system review of systems performed and negative with exception of: Memory loss confusion weakness slurred speech dizziness anxiety not enough sleep hallucinations racing thoughts insomnia sleepiness joint pain diarrhea constipation feeling cold blurred vision fatigue swelling in legs.  ALLERGIES: Allergies  Allergen Reactions  . Lisinopril Swelling and Other (See Comments)    Reaction:  Tongue/mouth swelling   . Hctz  [Hydrochlorothiazide] Other (See Comments)    unknown  . Ivp Dye [Iodinated Diagnostic Agents] Hives  . Prednisone Other (See Comments)    unspecified  . Venlafaxine Hives and Other (See Comments)    hyperactive  . Sulfa Antibiotics Hives and Rash    HOME MEDICATIONS: Outpatient Medications Prior to Visit  Medication Sig Dispense Refill  . cetirizine (ZYRTEC) 10 MG tablet Take 10 mg by mouth daily.    . Cholecalciferol (VITAMIN D3) 1000 units CAPS Take 1,000 Units by mouth daily.    Marland Kitchen DEXILANT 60 MG capsule TAKE ONE (1) CAPSULE EACH DAY FOR ACID REFLUX (Patient taking differently: TAKE 60 MG EACH DAY FOR ACID REFLUX) 30 capsule 3  . dicyclomine (BENTYL) 10 MG capsule TAKE 1 CAPSULE BY MOUTH 4 TIMES DAILY BEFORE MEALS AND AT BEDTIME 120 capsule 3  . losartan (COZAAR) 25 MG tablet Take 1 tablet (25 mg total) by mouth daily. 30 tablet 0  . metoprolol tartrate (LOPRESSOR) 25 MG tablet Take 0.5 tablets (12.5 mg total) by mouth 2 (two) times daily. 60 tablet 0  . pregabalin (LYRICA) 25 MG capsule Limit  1 tablet by mouth per day or twice per day if tolerated 60 capsule 0  . Probiotic Product (VSL#3) CAPS Take 1 capsule by mouth 2 (two) times daily. 60 capsule 11  . simvastatin (ZOCOR) 10 MG tablet Take 1 tablet (10 mg total) by mouth every evening. 30 tablet 0   No facility-administered medications prior to visit.     PAST MEDICAL HISTORY: Past Medical History:  Diagnosis Date  . Asthma   . Chronic lower back pain   . COPD (chronic obstructive  pulmonary disease) (HCC)   . Diabetes mellitus without complication (HCC)   . Gastrointestinal bleed   . GERD (gastroesophageal reflux disease)   . History of colon polyps   . Hypertension   . Hypertensive heart disease   . IBS (irritable bowel syndrome)   . Ischemic colitis (HCC)   . Non-obstructive Coronary Artery Disease    a. 05/2009 [facility]): minimal nonobs dzs.  . Osteoarthritis   . PAF (paroxysmal atrial fibrillation) (HCC)    a.  09/2014 during GI illness;  b. CHA2DS2VASc = 5 (not currently on OAC 2/2 h/o GIB/ischemic colitis); c. 09/2014 Echo: EF 60-65%, imparied relaxation, nl RV size/fxn.  . Stroke (HCC)   . Transient cerebral ischemia due to atrial fibrillation (HCC)     PAST SURGICAL HISTORY: Past Surgical History:  Procedure Laterality Date  . ABDOMINAL HYSTERECTOMY    . APPENDECTOMY    . BACK SURGERY     x 3, last 2004.  . CHOLECYSTECTOMY    . COLONOSCOPY WITH PROPOFOL N/A 03/20/2017   Procedure: COLONOSCOPY WITH PROPOFOL;  Surgeon: Wyline MoodKiran Anna, MD;  [facility];  Service: Endoscopy;  Laterality: N/A;  . ESOPHAGOGASTRODUODENOSCOPY (EGD) WITH PROPOFOL N/A 03/20/2017   Procedure: ESOPHAGOGASTRODUODENOSCOPY (EGD) WITH PROPOFOL;  Surgeon: Wyline MoodKiran Anna, MD;  Location: [facility] ENDOSCOPY;  Service: Endoscopy;  Laterality: N/A;  . FOOT SURGERY Right   . HEMORRHOID BANDING    . left total hip arthroplasty  2012  . STOMACH SURGERY    . TONSILLECTOMY      FAMILY HISTORY: Family History  Problem Relation Age of Onset  . Heart attack Mother   . Hypertension Mother   . Heart disease Father   . Hypertension Father   . Hypertension Brother   . Hypertension Maternal Aunt     SOCIAL HISTORY:  Social History   Socioeconomic History  . Marital status: Divorced    Spouse name: Not on file  . Number of children: Not on file  . Years of education: Not on file  . Highest education level: Not on file  Social Needs  . Financial resource strain: Not on file  . Food insecurity - worry: Not on file  . Food insecurity - inability: Not on file  . Transportation needs - medical: Not on file  . Transportation needs - non-medical: Not on file  Occupational History  . Not on file  Tobacco Use  . Smoking status: Never Smoker  . Smokeless tobacco: Never Used  Substance and Sexual Activity  . Alcohol use: No  . Drug use: No  . Sexual activity: Not on file  Other Topics Concern  . Not on file  Social  History Narrative  . Not on file     PHYSICAL EXAM  GENERAL EXAM/CONSTITUTIONAL: Vitals:  Vitals:   02/02/18 1042  BP: 139/77  Pulse: 65  Weight: 160 lb (72.6 kg)  Height: 5\' 5"  (1.651 m)    Body mass index is 25.82 kg/m.  No exam data present  Patient is in no distress; well developed, nourished and groomed; neck is supple  CARDIOVASCULAR:  Examination of carotid arteries is normal; no carotid bruits  Regular rate and rhythm, no murmurs  Examination of peripheral vascular system by observation and palpation is normal  EYES:  Ophthalmoscopic exam of optic discs and posterior segments is normal; no papilledema or hemorrhages  MUSCULOSKELETAL:  Gait, strength, tone, movements noted in Neurologic exam below  NEUROLOGIC: MENTAL STATUS:  No flowsheet data found.  awake, alert, oriented  to person  [facility] memory  DECR attention and concentration  PARAPHASIC ERROR; DIFF IN NAMING; SLIGHTLY IMPAIRED FLUENCY; COMPREHENSION IMPAIRED FOR MULTI-STEP COMMANDS   fund of knowledge appropriate  CRANIAL NERVE:   2nd - no papilledema on fundoscopic exam  2nd, 3rd, 4th, 6th - pupils equal and reactive to light, visual fields full to confrontation, extraocular muscles intact, no nystagmus  5th - facial sensation symmetric  7th - facial strength symmetric  8th - hearing intact  9th - palate elevates symmetrically, uvula midline  11th - shoulder shrug symmetric  12th - tongue protrusion midline  MOTOR:   normal bulk and tone, full strength in the BUE, BLE  SENSORY:   normal and symmetric to light touch; DECR SENS IN HANDS  COORDINATION:   finger-nose-finger, fine finger movements normal  REFLEXES:   deep tendon reflexes 1+ and symmetric  GAIT/STATION:   narrow based gait    DIAGNOSTIC DATA (LABS, IMAGING, TESTING) - I reviewed patient records, labs, notes, testing and imaging myself where available.  Lab Results  Component Value Date   WBC 7.1  09/05/2017   HGB 10.6 (L) 09/05/2017   HCT 32.9 (L) 09/05/2017   MCV 89.9 09/05/2017   PLT 260 09/05/2017      Component Value Date/Time   NA 138 09/05/2017 0549   NA 142 07/22/2016 1542   NA 140 09/24/2014 0439   K 3.8 09/05/2017 0549   K 4.1 09/24/2014 0439   CL 103 09/05/2017 0549   CL 104 09/24/2014 0439   CO2 28 09/05/2017 0549   CO2 31 09/24/2014 0439   GLUCOSE 115 (H) 09/05/2017 0549   GLUCOSE 162 (H) 09/24/2014 0439   BUN 13 09/05/2017 0549   BUN 21 07/22/2016 1542   BUN 14 09/24/2014 0439   CREATININE 1.01 (H) 09/05/2017 0549   CREATININE 1.30 09/24/2014 0439   CALCIUM 9.2 09/05/2017 0549   CALCIUM 7.9 (L) 09/24/2014 0439   PROT 6.4 (L) 09/05/2017 0549   PROT 6.6 09/20/2014 0503   ALBUMIN 3.3 (L) 09/05/2017 0549   ALBUMIN 3.1 (L) 09/20/2014 0503   AST 18 09/05/2017 0549   AST 41 (H) 09/20/2014 0503   ALT 16 09/05/2017 0549   ALT 27 09/20/2014 0503   ALKPHOS 83 09/05/2017 0549   ALKPHOS 90 09/20/2014 0503   BILITOT 0.9 09/05/2017 0549   BILITOT 0.3 09/20/2014 0503   GFRNONAA 52 (L) 09/05/2017 0549   GFRNONAA 42 (L) 09/24/2014 0439   GFRNONAA 59 (L) 09/27/2013 1420   GFRAA >60 09/05/2017 0549   GFRAA 51 (L) 09/24/2014 0439   GFRAA >60 09/27/2013 1420   No results found for: CHOL, HDL, LDLCALC, LDLDIRECT, TRIG, CHOLHDL Lab Results  Component Value Date   HGBA1C 6.8 (H) 09/02/2017   No results found for: VITAMINB12 Lab Results  Component Value Date   TSH 0.638 04/02/2017     09/03/17 CT head [I reviewed images myself and agree with interpretation. -VRP]  1. Stable size of left temporal lobe parenchymal hematoma and trace left-sided subdural hematoma. Stable mass effect with 5 mm left-to-right midline shift. 2. No new acute intracranial abnormality.     ASSESSMENT AND PLAN  77 y.o. year old female here with left temporal intracerebral hemorrhage, with significant language deficits, improving since September 2018.  Will proceed with MRI of the  brain and MRA head to rule out underlying mass or vascular formation.  History of dementia prior to onset of hemorrhage would raise possibility of amyloid angiopathy.  Continue current  medication and supervision.   Ddx: cerebral amyloid angiopathy (? h/o dementia before ICH), hypertensive ICH, hemorrhagic transformation of ischemic stroke  1. Nontraumatic cortical hemorrhage of left cerebral hemisphere [facility])      PLAN:  - MRI brain / MRA head - BP control per PCP - continue 24 hour supervision  Orders Placed This Encounter  Procedures  . MR BRAIN W WO CONTRAST  . MR MRA HEAD WO CONTRAST   Return in about 6 months (around 08/02/2018).  I reviewed images, labs, notes, records myself. I summarized findings and reviewed with patient, for this high risk condition (intracerebral hemorrhage) requiring high complexity decision making.    Suanne Marker, MD 02/02/2018, 10:43 AM Certified in Neurology, Neurophysiology and Neuroimaging  [facility] [address], [address] [place], [place] [address] [phone]

## 2018-02-03 ENCOUNTER — Ambulatory Visit: Payer: Medicare Other | Admitting: Occupational Therapy

## 2018-02-03 ENCOUNTER — Ambulatory Visit: Payer: Medicare Other

## 2018-02-03 ENCOUNTER — Ambulatory Visit: Payer: Medicare Other | Admitting: Speech Pathology

## 2018-02-03 ENCOUNTER — Telehealth: Payer: Self-pay | Admitting: Diagnostic Neuroimaging

## 2018-02-03 ENCOUNTER — Encounter: Payer: Self-pay | Admitting: Occupational Therapy

## 2018-02-03 ENCOUNTER — Encounter: Payer: Self-pay | Admitting: Speech Pathology

## 2018-02-03 DIAGNOSIS — M6281 Muscle weakness (generalized): Secondary | ICD-10-CM

## 2018-02-03 DIAGNOSIS — R4701 Aphasia: Secondary | ICD-10-CM

## 2018-02-03 NOTE — Therapy (Signed)
[facility] MAIN [facility] SERVICES [address] [place], [place], [address] Phone: [phone]   Fax:  [phone]  Occupational Therapy Treatment  Patient Details  Name: ALEECIA TAPIA MRN: [id number] Date of Birth: 02/07/1941 Referring Provider: BURNS, HARRIETT P   Encounter Date: 02/03/2018  OT End of Session - 02/03/18 0945    Visit Number  25    Number of Visits  48    Date for OT Re-Evaluation  03/03/18    OT Start Time  0917    OT Stop Time  1000    OT Time Calculation (min)  43 min    Activity Tolerance  Patient tolerated treatment well    Behavior During Therapy  [facility] for tasks assessed/performed       Past Medical History:  Diagnosis Date  . Asthma   . Chronic lower back pain   . COPD (chronic obstructive pulmonary disease) (HCC)   . Diabetes mellitus without complication (HCC)   . Gastrointestinal bleed   . GERD (gastroesophageal reflux disease)   . History of colon polyps   . Hypertension   . Hypertensive heart disease   . IBS (irritable bowel syndrome)   . Ischemic colitis (HCC)   . Non-obstructive Coronary Artery Disease    a. 05/2009 Cath [facility]): minimal nonobs dzs.  . Osteoarthritis   . PAF (paroxysmal atrial fibrillation) (HCC)    a. 09/2014 during GI illness;  b. CHA2DS2VASc = 5 (not currently on OAC 2/2 h/o GIB/ischemic colitis); c. 09/2014 Echo: EF 60-65%, imparied relaxation, nl RV size/fxn.  . Stroke (HCC)   . Transient cerebral ischemia due to atrial fibrillation [facility])     Past Surgical History:  Procedure Laterality Date  . ABDOMINAL HYSTERECTOMY    . APPENDECTOMY    . BACK SURGERY     x 3, last 2004.  . CHOLECYSTECTOMY    . COLONOSCOPY WITH PROPOFOL N/A 03/20/2017   Procedure: COLONOSCOPY WITH PROPOFOL;  Surgeon: Wyline Mood, MD;  Location: [facility] ENDOSCOPY;  Service: Endoscopy;  Laterality: N/A;  . ESOPHAGOGASTRODUODENOSCOPY (EGD) WITH PROPOFOL N/A 03/20/2017   Procedure: ESOPHAGOGASTRODUODENOSCOPY (EGD) WITH  PROPOFOL;  Surgeon: Wyline Mood, MD;  Location: [facility] ENDOSCOPY;  Service: Endoscopy;  Laterality: N/A;  . FOOT SURGERY Right   . HEMORRHOID BANDING    . left total hip arthroplasty  2012  . STOMACH SURGERY    . TONSILLECTOMY      There were no vitals filed for this visit.  Subjective Assessment - 02/03/18 0940    Subjective   Pt. reports she has to come back later in the week.    Patient is accompained by:  Family member    Pertinent History  Pt. is a 77 y.o. female who suffered a Left temporla Lobe Hemorrhage secondary to Hypertensive Crisis, Moderate ICH, Left Frontal Trace SDH, and Traumatic ICH secondary to a fall. Pt. PMHX includes: HTN, DM with peripheral Neuropathy, Acute Kidney Injury, Hyperlipdidemia, and history of a GI Bleed. Pt. went the inpatient rehab at [facility], was discharged, and now is ready for outpatient rehab services.    Patient Stated Goals  Patient reports she would like to do as much as she can for herself.    Currently in Pain?  No/denies      OT TREATMENT    Neuro muscular re-education:  Pt. Worked on sorting, matching, and flipping cards.  Therapeutic Exercise:  Pt. performed 2# dowel ex. For UE strengthening secondary to weakness. Bilateral shoulder flexion, chest  press, circular patterns, and elbow flexion/extension were performed. 2# dumbbell ex. for elbow flexion and extension, forearm supination/pronation.  Pt. requires rest breaks and verbal cues for proper technique.                        OT Education - 02/03/18 0945    Education provided  Yes    Education Details  [facility]    Person(s) Educated  Patient    Methods  Explanation    Comprehension  Verbalized understanding         OT Long Term Goals - 12/09/17 0908      OT LONG TERM GOAL #1   Title  Pt. will be independent with basic ADL care needs.    Baseline  Eval: Pt. requires assist, 11/13: Pt. requires minA UE, ModA LE, 12/09/2018: Pt. conitnues to require assist.     Time  12    Period  Weeks    Status  On-going    Target Date  03/03/18      OT LONG TERM GOAL #2   Title  Pt. will be independent with light meal preparation.    Baseline  Eval: Pt. is unable, 11/04/2017: Pt. is able to assist with light cold meal prep, ahowver requires assist,. 12/09/2018: Pt. continues to require assist    Time  12    Period  Weeks    Status  On-going    Target Date  03/03/18      OT LONG TERM GOAL #3   Title  Pt. will be independent with tub/shower transfers    Baseline  Eval: Pt. requires assist, 11/04/2017: pt. conitnues to require assist using a shower seat.    Time  12    Period  Weeks    Status  On-going    Target Date  03/03/18      OT LONG TERM GOAL #4   Title  Pt. will increase BUE strength by 2 mm grades to assist with ADLs.    Baseline  Eval: BUE strength 4/5 overall. 12/09/2018: BUEs: shoulder flexion, and abduction 4/5, elbow flexion/extension: 4+/5    Time  12    Period  Weeks    Status  On-going    Target Date  03/03/18      OT LONG TERM GOAL #5   Title  Pt. will improve right [facility] skills to be able to assist with buttoning, and zipping.     Baseline  Eval: impaired, 11/04/2017:  Pt. continues to require work on improving [facility] skills for buttnoning, and zipping clothing. 12/08/2017: Pt. continues to require assist    Time  12    Period  Weeks    Status  On-going    Target Date  03/03/18      OT LONG TERM GOAL #6   Title  Pt. will improve right grip strength to be able to open containers.    Baseline  Eval: Limited. 11/04/2017: improving, 12/09/2017: Pt. continues to have difficulty    Time  12    Period  Weeks    Status  On-going    Target Date  03/03/18      OT LONG TERM GOAL #7   Title  Pt. will identify potential safety hazards accurately during ADLs, and IADLs with 100% accuracy.    Baseline  Eval: limited 11/04/2017: Conitnues to be impaired, 12/09/2017: Pt. requires frequent cues.    Time  12    Period  Weeks  Status   On-going    Target Date  03/03/18      OT LONG TERM GOAL #8   Title  Pt. will demonstrate cognitive compensatory techniques 100% of the time with minimal cues.    Baseline  Eval: limite11/13/2018: conitnues to be limited. 12/09/2018: Pt. continues to require assist.    Time  12    Period  Weeks    Target Date  03/03/18            Plan - 02/03/18 0947    Clinical Impression Statement  Pt. continues to require frequent verbal cues, and cognitive assist to perform self-care tasks. Pt. requires visual demonstration. Pt. Continues to work on improving UE functioning for improved ADL, and IADL tasks.   Occupational Profile and client history currently impacting functional performance  quires cues for safety as she attempts to leave her     Occupational performance deficits (Please refer to evaluation for details):  ADL's;IADL's    Rehab Potential  Good    OT Frequency  2x / week    OT Duration  12 weeks    OT Treatment/Interventions  Self-care/ADL training;Therapeutic activities;Therapeutic exercise    Clinical Decision Making  Limited treatment options, no task modification necessary    Consulted and Agree with Plan of Care  Patient       Patient will benefit from skilled therapeutic intervention in order to improve the following deficits and impairments:  Abnormal gait, Decreased activity tolerance, Decreased cognition, Decreased balance, Decreased strength, Impaired UE functional use, Decreased coordination  Visit Diagnosis: Muscle weakness (generalized)    Problem List Patient Active Problem List   Diagnosis Date Noted  . Gait disturbance, post-stroke 09/04/2017  . Wernicke's aphasia 09/04/2017  . 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 However, needs to rule out CAA, tumor or CVT (vein of labbe)  09/01/2017  . Constipation 01/06/2017  . DJD (degenerative joint disease) of knee  07/18/2016  . GIB (gastrointestinal bleeding) 05/25/2016  . GI bleed 05/24/2016  . Degenerative joint disease (DJD) of hip 05/08/2016  . 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 Hypertensive heart disease   . Hemorrhage of gastrointestinal tract 11/13/2015  . Lumbosacral facet joint syndrome 07/26/2015  . Angioedema 07/13/2015  . DM2 (diabetes mellitus, type 2) (HCC) 07/13/2015  . Status post lumbar laminectomy 06/19/2015  . Lumbar radiculopathy 06/19/2015  . DDD (degenerative disc disease), lumbar 05/11/2015  . Facet syndrome, lumbar 05/11/2015  . Lumbar post-laminectomy syndrome 05/11/2015  . Sacroiliac joint disease 05/11/2015  . 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 Neuropathy due to secondary diabetes (HCC) 05/11/2015  . Essential hypertension 01/03/2015  . Coronary atherosclerosis of native coronary artery   . PAF (paroxysmal atrial fibrillation) (HCC) 10/20/2014  . Ischemic colitis (HCC) 10/20/2014  . Diabetes mellitus type 2 with complications (HCC) 10/20/2014  . COPD (chronic obstructive pulmonary disease) (HCC) 10/20/2014  . Hyperlipidemia 10/20/2014  . GERD (gastroesophageal reflux disease) 10/20/2014  . Ingrowing nail, right great toe 04/27/2014  . Internal hemorrhoid, bleeding 07/14/2013    Olegario MessierElaine Dakoda [place], MS, OTR/L 02/03/2018, 9:59 AM  [facility] MAIN [facility] [address] [place], [place], [address] Phone: [phone]   Fax:  [phone]  Name: Tomma RakersMary L Cypress MRN: [id number] Date of Birth: 01-Oct-1941

## 2018-02-03 NOTE — Therapy (Signed)
[place] MAIN [facility] SERVICES [place], [place], [address] Phone: [phone]   Fax:  [phone]  Speech Language Pathology Treatment  Patient Details  Name: Yvette Collier MRN: [id number] Date of Birth: February 28, 1941 Referring Provider: Dr. Letta Pate   Encounter Date: 02/03/2018  End of Session - 02/03/18 1553    Visit Number  21    Number of Visits  35    Date for SLP Re-Evaluation  03/20/18    SLP Start Time  1000    SLP Stop Time   5625    SLP Time Calculation (min)  47 min    Activity Tolerance  Patient tolerated treatment well       Past Medical History:  Diagnosis Date  . Asthma   . Chronic lower back pain   . COPD (chronic obstructive pulmonary disease) ([place])   . Diabetes mellitus without complication ([place])   . Gastrointestinal bleed   . GERD (gastroesophageal reflux disease)   . History of colon polyps   . Hypertension   . Hypertensive heart disease   . IBS (irritable bowel syndrome)   . Ischemic colitis ([place])   . Non-obstructive Coronary Artery Disease    a. 05/2009 Cath [facility]): minimal nonobs dzs.  . Osteoarthritis   . PAF (paroxysmal atrial fibrillation) ([place])    a. 09/2014 during GI illness;  b. CHA2DS2VASc = 5 (not currently on [place] 2/2 h/o GIB/ischemic colitis); c. 09/2014 Echo: EF 60-65%, imparied relaxation, nl RV size/fxn.  . Stroke ([place])   . Transient cerebral ischemia due to atrial fibrillation [facility])     Past Surgical History:  Procedure Laterality Date  . ABDOMINAL HYSTERECTOMY    . APPENDECTOMY    . BACK SURGERY     x 3, last 2004.  . CHOLECYSTECTOMY    . COLONOSCOPY WITH PROPOFOL N/A 03/20/2017   Procedure: COLONOSCOPY WITH PROPOFOL;  Surgeon: Jonathon Bellows, MD;  Location: [facility] ENDOSCOPY;  Service: Endoscopy;  Laterality: N/A;  . ESOPHAGOGASTRODUODENOSCOPY (EGD) WITH PROPOFOL N/A 03/20/2017   Procedure: ESOPHAGOGASTRODUODENOSCOPY (EGD) WITH PROPOFOL;  Surgeon: Jonathon Bellows, MD;  Location: [facility]  ENDOSCOPY;  Service: Endoscopy;  Laterality: N/A;  . FOOT SURGERY Right   . HEMORRHOID BANDING    . left total hip arthroplasty  2012  . STOMACH SURGERY    . TONSILLECTOMY      There were no vitals filed for this visit.  Subjective Assessment - 02/03/18 1552    Subjective  Patient is joking and laughing            ADULT SLP TREATMENT - 02/03/18 0001      General Information   Behavior/Cognition  Alert;Cooperative;Pleasant mood;Requires cueing    HPI  Patient is a 77 y/o woman with left temproal lobe hemorrhage on 09/01/17.      Treatment Provided   Treatment provided  Cognitive-Linquistic      Pain Assessment   Pain Assessment  No/denies pain      Cognitive-Linquistic Treatment   Treatment focused on  Aphasia    Skilled Treatment  VERBAL EXPRESSION: Read 3 word phrases independently with 85% accuracy.  Name pictured object given written phrase completion cue with 40% accuracy, improves to 60% given phonemic cue and 95% given written word.  Name common object with 15% accuracy; improves to 75% accuracy given semantic/phonemic cues.       Assessment / Recommendations / Plan   Plan  Continue with current plan of care      Progression  Toward Goals   Progression toward goals  Progressing toward goals       SLP Education - 02/03/18 1552    Education provided  Yes    Education Details  slow down and listen    Person(s) Educated  Patient    Methods  Explanation    Comprehension  Verbalized understanding         SLP Long Term Goals - 01/21/18 0943      SLP LONG TERM GOAL #2   Title  Patient will complete 3 unit processing tasks with 80% accuracy without the need of repetition of task instructions or significant delays in responding.    Time  8    Period  Weeks    Status  Partially Met    Target Date  03/20/18      SLP LONG TERM GOAL #3   Title  Patient will name common objects with 50% accuracy.    Time  8    Period  Weeks    Status  Partially Met    Target  Date  03/20/18      SLP LONG TERM GOAL #5   Title  Patient will generate meaningful phrase to complete simple/concrete linguistic task with 80% accuracy.    Time  8    Period  Weeks    Status  Partially Met    Target Date  03/20/18      SLP LONG TERM GOAL #6   Title  Patient will read aloud and follow written commands with 80% accuracy    Time  8    Period  Weeks    Status  Partially Met    Target Date  03/20/18      SLP LONG TERM GOAL #7   Title  Patient will name pictured object given written phrase completion stimulus with 80% accuracy.    Time  8    Period  Weeks    Status  Partially Met    Target Date  03/20/18       Plan - 02/03/18 1553    Clinical Impression Statement  The patient is generating more intelligible and appropriate speech as her auditory comprehension is improving.  The patient continues to improve in overall communicative effectiveness.  She continues to display phonemic paraphasias, neologisms, and perseverations in open-ended verbal expression tasks.       Speech Therapy Frequency  2x / week    Duration  Other (comment)    Treatment/Interventions  Language facilitation;Multimodal communcation approach;SLP instruction and feedback;Patient/family education    Potential to Achieve Goals  Good    Potential Considerations  Ability to learn/carryover information;Co-morbidities;Cooperation/participation level;Medical prognosis;Pain level;Previous level of function;Severity of impairments;Family/community support    SLP Home Exercise Plan  read aloud 3- and 4-letter words    Consulted and Agree with Plan of Care  Patient       Patient will benefit from skilled therapeutic intervention in order to improve the following deficits and impairments:   Aphasia    Problem List Patient Active Problem List   Diagnosis Date Noted  . Gait disturbance, post-stroke 09/04/2017  . Wernicke's aphasia 09/04/2017  . Left temporal lobe hemorrhage (HCC) -  left temporal  moderate ICH and left frontal trace SDH, concerning for traumatic ICH due to fall. However, needs to rule out CAA, tumor or CVT (vein of labbe)  09/01/2017  . Constipation 01/06/2017  . DJD (degenerative joint disease) of knee 07/18/2016  . GIB (gastrointestinal bleeding) 05/25/2016  . GI bleed  05/24/2016  . Degenerative joint disease (DJD) of hip 05/08/2016  . 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 Hypertensive heart disease   . Hemorrhage of gastrointestinal tract 11/13/2015  . Lumbosacral facet joint syndrome 07/26/2015  . Angioedema 07/13/2015  . DM2 (diabetes mellitus, type 2) ([place]) 07/13/2015  . Status post lumbar laminectomy 06/19/2015  . Lumbar radiculopathy 06/19/2015  . DDD (degenerative disc disease), lumbar 05/11/2015  . Facet syndrome, lumbar 05/11/2015  . Lumbar post-laminectomy syndrome 05/11/2015  . Sacroiliac joint disease 05/11/2015  . 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 Neuropathy due to secondary diabetes ([place]) 05/11/2015  . Essential hypertension 01/03/2015  . Coronary atherosclerosis of native coronary artery   . PAF (paroxysmal atrial fibrillation) ([place]) 10/20/2014  . Ischemic colitis ([place]) 10/20/2014  . Diabetes mellitus type 2 with complications ([place]) 21/79/8102  . COPD (chronic obstructive pulmonary disease) ([place]) 10/20/2014  . Hyperlipidemia 10/20/2014  . GERD (gastroesophageal reflux disease) 10/20/2014  . Ingrowing nail, right great toe 04/27/2014  . Internal hemorrhoid, bleeding 07/14/2013   Leroy Sea, MS/CCC- SLP  Lou Miner 02/03/2018, 3:54 PM  [place] MAIN [facility] SERVICES [address] [place], [place], [address] Phone: [phone]   Fax:  [phone]   Name: Yvette Collier MRN: [id number] Date of Birth: 1941-03-30

## 2018-02-03 NOTE — Telephone Encounter (Signed)
Patient is schedule to have her MRI/MRA on 02/10/18 at [facility]. I left a detail message on the phone to inform her of this appt. I also left their phone number of [phone] if she needed to reschedule for any reason.

## 2018-02-05 ENCOUNTER — Encounter: Payer: Self-pay | Admitting: Speech Pathology

## 2018-02-05 ENCOUNTER — Ambulatory Visit: Payer: Medicare Other | Admitting: Occupational Therapy

## 2018-02-05 ENCOUNTER — Ambulatory Visit: Payer: Medicare Other

## 2018-02-05 ENCOUNTER — Ambulatory Visit: Payer: Medicare Other | Admitting: Speech Pathology

## 2018-02-05 ENCOUNTER — Encounter: Payer: Self-pay | Admitting: Occupational Therapy

## 2018-02-05 DIAGNOSIS — M6281 Muscle weakness (generalized): Secondary | ICD-10-CM | POA: Diagnosis not present

## 2018-02-05 DIAGNOSIS — R278 Other lack of coordination: Secondary | ICD-10-CM

## 2018-02-05 DIAGNOSIS — R4701 Aphasia: Secondary | ICD-10-CM

## 2018-02-05 NOTE — Therapy (Signed)
[facility] MAIN [facility] SERVICES [address] [place], [place], [address] Phone: [phone]   Fax:  [phone]  Occupational Therapy Treatment  Patient Details  Name: Yvette Collier MRN: [id number] Date of Birth: 29-May-1941 Referring Provider: BURNS, HARRIETT P   Encounter Date: 02/05/2018  OT End of Session - 02/05/18 0941    Visit Number  26    Number of Visits  48    Date for OT Re-Evaluation  03/03/18    OT Start Time  0935    OT Stop Time  1000    OT Time Calculation (min)  25 min    Activity Tolerance  Patient tolerated treatment well    Behavior During Therapy  [facility] for tasks assessed/performed       Past Medical History:  Diagnosis Date  . Asthma   . Chronic lower back pain   . COPD (chronic obstructive pulmonary disease) (HCC)   . Diabetes mellitus without complication (HCC)   . Gastrointestinal bleed   . GERD (gastroesophageal reflux disease)   . History of colon polyps   . Hypertension   . Hypertensive heart disease   . IBS (irritable bowel syndrome)   . Ischemic colitis (HCC)   . Non-obstructive Coronary Artery Disease    a. 05/2009 Cath [facility]): minimal nonobs dzs.  . Osteoarthritis   . PAF (paroxysmal atrial fibrillation) (HCC)    a. 09/2014 during GI illness;  b. CHA2DS2VASc = 5 (not currently on OAC 2/2 h/o GIB/ischemic colitis); c. 09/2014 Echo: EF 60-65%, imparied relaxation, nl RV size/fxn.  . Stroke (HCC)   . Transient cerebral ischemia due to atrial fibrillation [facility])     Past Surgical History:  Procedure Laterality Date  . ABDOMINAL HYSTERECTOMY    . APPENDECTOMY    . BACK SURGERY     x 3, last 2004.  . CHOLECYSTECTOMY    . COLONOSCOPY WITH PROPOFOL N/A 03/20/2017   Procedure: COLONOSCOPY WITH PROPOFOL;  Surgeon: Wyline Mood, MD;  Location: [facility] ENDOSCOPY;  Service: Endoscopy;  Laterality: N/A;  . ESOPHAGOGASTRODUODENOSCOPY (EGD) WITH PROPOFOL N/A 03/20/2017   Procedure: ESOPHAGOGASTRODUODENOSCOPY (EGD) WITH  PROPOFOL;  Surgeon: Wyline Mood, MD;  Location: [facility] ENDOSCOPY;  Service: Endoscopy;  Laterality: N/A;  . FOOT SURGERY Right   . HEMORRHOID BANDING    . left total hip arthroplasty  2012  . STOMACH SURGERY    . TONSILLECTOMY      There were no vitals filed for this visit.  Subjective Assessment - 02/05/18 0939    Subjective   Pt. was late for session. Pt.'s daughter locked her keys in the car.    Patient is accompained by:  Family member    Pertinent History  Pt. is a 77 y.o. female who suffered a Left temporla Lobe Hemorrhage secondary to Hypertensive Crisis, Moderate ICH, Left Frontal Trace SDH, and Traumatic ICH secondary to a fall. Pt. PMHX includes: HTN, DM with peripheral Neuropathy, Acute Kidney Injury, Hyperlipdidemia, and history of a GI Bleed. Pt. went the inpatient rehab at [facility], was discharged, and now is ready for outpatient rehab services.    Patient Stated Goals  Patient reports she would like to do as much as she can for herself.    Currently in Pain?  No/denies      OT TREATMENT    Neuro muscular re-education:  Pt. Worked on [facility] skills grasping, and using 2pt. Pinch to place them at a vertical angle. Pt. Worked on tying, and untying knots.  OT Education - 02/05/18 0941    Education provided  Yes    Education Details  [facility]    Person(s) Educated  Patient    Methods  Explanation    Comprehension  Verbalized understanding         OT Long Term Goals - 12/09/17 0908      OT LONG TERM GOAL #1   Title  Pt. will be independent with basic ADL care needs.    Baseline  Eval: Pt. requires assist, 11/13: Pt. requires minA UE, ModA LE, 12/09/2018: Pt. conitnues to require assist.    Time  12    Period  Weeks    Status  On-going    Target Date  03/03/18      OT LONG TERM GOAL #2   Title  Pt. will be independent with light meal preparation.    Baseline  Eval: Pt. is unable, 11/04/2017: Pt. is able to assist with light  cold meal prep, ahowver requires assist,. 12/09/2018: Pt. continues to require assist    Time  12    Period  Weeks    Status  On-going    Target Date  03/03/18      OT LONG TERM GOAL #3   Title  Pt. will be independent with tub/shower transfers    Baseline  Eval: Pt. requires assist, 11/04/2017: pt. conitnues to require assist using a shower seat.    Time  12    Period  Weeks    Status  On-going    Target Date  03/03/18      OT LONG TERM GOAL #4   Title  Pt. will increase BUE strength by 2 mm grades to assist with ADLs.    Baseline  Eval: BUE strength 4/5 overall. 12/09/2018: BUEs: shoulder flexion, and abduction 4/5, elbow flexion/extension: 4+/5    Time  12    Period  Weeks    Status  On-going    Target Date  03/03/18      OT LONG TERM GOAL #5   Title  Pt. will improve right [facility] skills to be able to assist with buttoning, and zipping.     Baseline  Eval: impaired, 11/04/2017:  Pt. continues to require work on improving [facility] skills for buttnoning, and zipping clothing. 12/08/2017: Pt. continues to require assist    Time  12    Period  Weeks    Status  On-going    Target Date  03/03/18      OT LONG TERM GOAL #6   Title  Pt. will improve right grip strength to be able to open containers.    Baseline  Eval: Limited. 11/04/2017: improving, 12/09/2017: Pt. continues to have difficulty    Time  12    Period  Weeks    Status  On-going    Target Date  03/03/18      OT LONG TERM GOAL #7   Title  Pt. will identify potential safety hazards accurately during ADLs, and IADLs with 100% accuracy.    Baseline  Eval: limited 11/04/2017: Conitnues to be impaired, 12/09/2017: Pt. requires frequent cues.    Time  12    Period  Weeks    Status  On-going    Target Date  03/03/18      OT LONG TERM GOAL #8   Title  Pt. will demonstrate cognitive compensatory techniques 100% of the time with minimal cues.    Baseline  Eval: limite11/13/2018: conitnues to be limited. 12/09/2018: Pt. continues  to require assist.    Time  12    Period  Weeks    Target Date  03/03/18            Plan - 02/05/18 0941    Clinical Impression Statement  Pt. continues to present with limited UE strength, cognitive, and coordination skills. Pt. was able to tie, and untie knots, and was able to work on improving [facility] skills.     Occupational Profile and client history currently impacting functional performance  quires cues for safety as she attempts to leave her     Occupational performance deficits (Please refer to evaluation for details):  ADL's;IADL's    Rehab Potential  Good    OT Frequency  2x / week    OT Duration  12 weeks    OT Treatment/Interventions  Self-care/ADL training;Therapeutic activities;Therapeutic exercise    Clinical Decision Making  Limited treatment options, no task modification necessary    Consulted and Agree with Plan of Care  Patient       Patient will benefit from skilled therapeutic intervention in order to improve the following deficits and impairments:  Abnormal gait, Decreased activity tolerance, Decreased cognition, Decreased balance, Decreased strength, Impaired UE functional use, Decreased coordination  Visit Diagnosis: Muscle weakness (generalized)  Other lack of coordination    Problem List Patient Active Problem List   Diagnosis Date Noted  . Gait disturbance, post-stroke 09/04/2017  . Wernicke's aphasia 09/04/2017  . 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 However, needs to rule out CAA, tumor or CVT (vein of labbe)  09/01/2017  . Constipation 01/06/2017  . DJD (degenerative joint disease) of knee 07/18/2016  . GIB (gastrointestinal bleeding) 05/25/2016  . GI bleed 05/24/2016  . Degenerative joint disease (DJD) of hip 05/08/2016  . 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 Hypertensive heart disease   . Hemorrhage of gastrointestinal tract 11/13/2015  .  Lumbosacral facet joint syndrome 07/26/2015  . Angioedema 07/13/2015  . DM2 (diabetes mellitus, type 2) (HCC) 07/13/2015  . Status post lumbar laminectomy 06/19/2015  . Lumbar radiculopathy 06/19/2015  . DDD (degenerative disc disease), lumbar 05/11/2015  . Facet syndrome, lumbar 05/11/2015  . Lumbar post-laminectomy syndrome 05/11/2015  . Sacroiliac joint disease 05/11/2015  . 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 Neuropathy due to secondary diabetes (HCC) 05/11/2015  . Essential hypertension 01/03/2015  . Coronary atherosclerosis of native coronary artery   . PAF (paroxysmal atrial fibrillation) (HCC) 10/20/2014  . Ischemic colitis (HCC) 10/20/2014  . Diabetes mellitus type 2 with complications (HCC) 10/20/2014  . COPD (chronic obstructive pulmonary disease) (HCC) 10/20/2014  . Hyperlipidemia 10/20/2014  . GERD (gastroesophageal reflux disease) 10/20/2014  . Ingrowing nail, right great toe 04/27/2014  . Internal hemorrhoid, bleeding 07/14/2013    Olegario Messier, MS, OTR/L 02/05/2018, 9:51 AM  [facility] MAIN [facility] SERVICES [address] [place], [place], [address] Phone: [phone]   Fax:  [phone]  Name: Yvette Collier MRN: [id number] Date of Birth: [birth year]

## 2018-02-05 NOTE — Therapy (Signed)
[place] MAIN [facility] SERVICES [place], [place], [address] Phone: [phone]   Fax:  [phone]  Speech Language Pathology Treatment  Patient Details  Name: Yvette Collier MRN: [id number] Date of Birth: 09/24/1941 Referring Provider: Dr. Letta Pate   Encounter Date: 02/05/2018  End of Session - 02/05/18 1159    Visit Number  22    Number of Visits  35    Date for SLP Re-Evaluation  03/20/18    SLP Start Time  67    SLP Stop Time   1100    SLP Time Calculation (min)  55 min    Activity Tolerance  Patient tolerated treatment well       Past Medical History:  Diagnosis Date  . Asthma   . Chronic lower back pain   . COPD (chronic obstructive pulmonary disease) ([place])   . Diabetes mellitus without complication ([place])   . Gastrointestinal bleed   . GERD (gastroesophageal reflux disease)   . History of colon polyps   . Hypertension   . Hypertensive heart disease   . IBS (irritable bowel syndrome)   . Ischemic colitis ([place])   . Non-obstructive Coronary Artery Disease    a. 05/2009 Cath [facility]): minimal nonobs dzs.  . Osteoarthritis   . PAF (paroxysmal atrial fibrillation) ([place])    a. 09/2014 during GI illness;  b. CHA2DS2VASc = 5 (not currently on [place] 2/2 h/o GIB/ischemic colitis); c. 09/2014 Echo: EF 60-65%, imparied relaxation, nl RV size/fxn.  . Stroke ([place])   . Transient cerebral ischemia due to atrial fibrillation [facility])     Past Surgical History:  Procedure Laterality Date  . ABDOMINAL HYSTERECTOMY    . APPENDECTOMY    . BACK SURGERY     x 3, last 2004.  . CHOLECYSTECTOMY    . COLONOSCOPY WITH PROPOFOL N/A 03/20/2017   Procedure: COLONOSCOPY WITH PROPOFOL;  Surgeon: Jonathon Bellows, MD;  Location: [facility] ENDOSCOPY;  Service: Endoscopy;  Laterality: N/A;  . ESOPHAGOGASTRODUODENOSCOPY (EGD) WITH PROPOFOL N/A 03/20/2017   Procedure: ESOPHAGOGASTRODUODENOSCOPY (EGD) WITH PROPOFOL;  Surgeon: Jonathon Bellows, MD;  Location: [facility]  ENDOSCOPY;  Service: Endoscopy;  Laterality: N/A;  . FOOT SURGERY Right   . HEMORRHOID BANDING    . left total hip arthroplasty  2012  . STOMACH SURGERY    . TONSILLECTOMY      There were no vitals filed for this visit.  Subjective Assessment - 02/05/18 1158    Subjective  Patient is joking and laughing            ADULT SLP TREATMENT - 02/05/18 0001      General Information   Behavior/Cognition  Alert;Cooperative;Pleasant mood;Requires cueing    HPI  Patient is a 77 y/o woman with left temproal lobe hemorrhage on 09/01/17.      Treatment Provided   Treatment provided  Cognitive-Linquistic      Pain Assessment   Pain Assessment  No/denies pain      Cognitive-Linquistic Treatment   Treatment focused on  Aphasia    Skilled Treatment  VERBAL EXPRESSION: Read 3 word phrases independently with 85% accuracy.  Name pictured object given written phrase completion cue with 40% accuracy, improves to 95% given phonemic cue.  Name common object with 15% accuracy; improves to 75% accuracy given semantic/phonemic cues.  Name object in delayed imitation task with 60% accuracy.      Assessment / Recommendations / Plan   Plan  Continue with current plan of care  Progression Toward Goals   Progression toward goals  Progressing toward goals       SLP Education - 02/05/18 1158    Education provided  Yes    Education Details  Slow down, listen    Person(s) Educated  Patient    Methods  Explanation    Comprehension  Verbalized understanding         SLP Long Term Goals - 01/21/18 0943      SLP LONG TERM GOAL #2   Title  Patient will complete 3 unit processing tasks with 80% accuracy without the need of repetition of task instructions or significant delays in responding.    Time  8    Period  Weeks    Status  Partially Met    Target Date  03/20/18      SLP LONG TERM GOAL #3   Title  Patient will name common objects with 50% accuracy.    Time  8    Period  Weeks    Status   Partially Met    Target Date  03/20/18      SLP LONG TERM GOAL #5   Title  Patient will generate meaningful phrase to complete simple/concrete linguistic task with 80% accuracy.    Time  8    Period  Weeks    Status  Partially Met    Target Date  03/20/18      SLP LONG TERM GOAL #6   Title  Patient will read aloud and follow written commands with 80% accuracy    Time  8    Period  Weeks    Status  Partially Met    Target Date  03/20/18      SLP LONG TERM GOAL #7   Title  Patient will name pictured object given written phrase completion stimulus with 80% accuracy.    Time  8    Period  Weeks    Status  Partially Met    Target Date  03/20/18       Plan - 02/05/18 1159    Clinical Impression Statement  The patient is generating more intelligible and appropriate speech as her auditory comprehension is improving.  The patient continues to improve in overall communicative effectiveness.  She continues to display phonemic paraphasias, neologisms, and perseverations in open-ended verbal expression tasks.       Speech Therapy Frequency  2x / week    Duration  Other (comment)    Treatment/Interventions  Language facilitation;Multimodal communcation approach;SLP instruction and feedback;Patient/family education    Potential to Achieve Goals  Good    Potential Considerations  Ability to learn/carryover information;Co-morbidities;Cooperation/participation level;Medical prognosis;Pain level;Previous level of function;Severity of impairments;Family/community support    SLP Home Exercise Plan  read aloud 3- and 4-letter words    Consulted and Agree with Plan of Care  Patient       Patient will benefit from skilled therapeutic intervention in order to improve the following deficits and impairments:   Aphasia    Problem List Patient Active Problem List   Diagnosis Date Noted  . Gait disturbance, post-stroke 09/04/2017  . Wernicke's aphasia 09/04/2017  . Left temporal lobe hemorrhage  (HCC) -  left temporal moderate ICH and left frontal trace SDH, concerning for traumatic ICH due to fall. However, needs to rule out CAA, tumor or CVT (vein of labbe)  09/01/2017  . Constipation 01/06/2017  . DJD (degenerative joint disease) of knee 07/18/2016  . GIB (gastrointestinal bleeding) 05/25/2016  . GI bleed  05/24/2016  . Degenerative joint disease (DJD) of hip 05/08/2016  . 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 Hypertensive heart disease   . Hemorrhage of gastrointestinal tract 11/13/2015  . Lumbosacral facet joint syndrome 07/26/2015  . Angioedema 07/13/2015  . DM2 (diabetes mellitus, type 2) ([place]) 07/13/2015  . Status post lumbar laminectomy 06/19/2015  . Lumbar radiculopathy 06/19/2015  . DDD (degenerative disc disease), lumbar 05/11/2015  . Facet syndrome, lumbar 05/11/2015  . Lumbar post-laminectomy syndrome 05/11/2015  . Sacroiliac joint disease 05/11/2015  . 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 Neuropathy due to secondary diabetes ([place]) 05/11/2015  . Essential hypertension 01/03/2015  . Coronary atherosclerosis of native coronary artery   . PAF (paroxysmal atrial fibrillation) ([place]) 10/20/2014  . Ischemic colitis ([place]) 10/20/2014  . Diabetes mellitus type 2 with complications ([place]) [id number]  . COPD (chronic obstructive pulmonary disease) ([place]) 10/20/2014  . Hyperlipidemia 10/20/2014  . GERD (gastroesophageal reflux disease) 10/20/2014  . Ingrowing nail, right great toe 04/27/2014  . Internal hemorrhoid, bleeding 07/14/2013   Leroy Sea, MS/CCC- SLP  Lou Miner 02/05/2018, 12:00 PM  [place] MAIN [facility] SERVICES [address] [place], [place], [address] Phone: [phone]   Fax:  [phone]   Name: Yvette Collier MRN: [id number] Date of Birth: [birth year]

## 2018-02-10 ENCOUNTER — Other Ambulatory Visit: Payer: Medicare Other

## 2018-02-10 ENCOUNTER — Encounter: Payer: Self-pay | Admitting: Occupational Therapy

## 2018-02-10 ENCOUNTER — Ambulatory Visit: Payer: Medicare Other

## 2018-02-10 ENCOUNTER — Encounter: Payer: Medicare Other | Admitting: Occupational Therapy

## 2018-02-10 ENCOUNTER — Ambulatory Visit: Payer: Medicare Other | Admitting: Occupational Therapy

## 2018-02-10 ENCOUNTER — Ambulatory Visit: Admission: RE | Admit: 2018-02-10 | Payer: Medicare Other | Source: Ambulatory Visit

## 2018-02-10 DIAGNOSIS — M6281 Muscle weakness (generalized): Secondary | ICD-10-CM | POA: Diagnosis not present

## 2018-02-10 DIAGNOSIS — R278 Other lack of coordination: Secondary | ICD-10-CM

## 2018-02-10 NOTE — Therapy (Signed)
[place] [facility] MAIN [facility] SERVICES [address] [place], [place], [address] Phone: [phone]   Fax:  [phone]  Occupational Therapy Treatment  Patient Details  Name: Yvette Collier MRN: [id number] Date of Birth: 06-28-1941 Referring Provider: BURNS, HARRIETT P   Encounter Date: 02/10/2018  OT End of Session - 02/10/18 0950    Visit Number  27    Number of Visits  48    Date for OT Re-Evaluation  03/03/18    OT Start Time  0945    OT Stop Time  1015    OT Time Calculation (min)  30 min    Activity Tolerance  Patient tolerated treatment well    Behavior During Therapy  [facility] for tasks assessed/performed       Past Medical History:  Diagnosis Date  . Asthma   . Chronic lower back pain   . COPD (chronic obstructive pulmonary disease) (HCC)   . Diabetes mellitus without complication (HCC)   . Gastrointestinal bleed   . GERD (gastroesophageal reflux disease)   . History of colon polyps   . Hypertension   . Hypertensive heart disease   . IBS (irritable bowel syndrome)   . Ischemic colitis (HCC)   . Non-obstructive Coronary Artery Disease    a. 05/2009 Cath [facility]): minimal nonobs dzs.  . Osteoarthritis   . PAF (paroxysmal atrial fibrillation) (HCC)    a. 09/2014 during GI illness;  b. CHA2DS2VASc = 5 (not currently on OAC 2/2 h/o GIB/ischemic colitis); c. 09/2014 Echo: EF 60-65%, imparied relaxation, nl RV size/fxn.  . Stroke (HCC)   . Transient cerebral ischemia due to atrial fibrillation [facility])     Past Surgical History:  Procedure Laterality Date  . ABDOMINAL HYSTERECTOMY    . APPENDECTOMY    . BACK SURGERY     x 3, last 2004.  . CHOLECYSTECTOMY    . COLONOSCOPY WITH PROPOFOL N/A 03/20/2017   Procedure: COLONOSCOPY WITH PROPOFOL;  Surgeon: Wyline Mood, MD;  Location: [facility] ENDOSCOPY;  Service: Endoscopy;  Laterality: N/A;  . ESOPHAGOGASTRODUODENOSCOPY (EGD) WITH PROPOFOL N/A 03/20/2017   Procedure: ESOPHAGOGASTRODUODENOSCOPY (EGD) WITH  PROPOFOL;  Surgeon: Wyline Mood, MD;  Location: [facility] ENDOSCOPY;  Service: Endoscopy;  Laterality: N/A;  . FOOT SURGERY Right   . HEMORRHOID BANDING    . left total hip arthroplasty  2012  . STOMACH SURGERY    . TONSILLECTOMY      There were no vitals filed for this visit.  Subjective Assessment - 02/10/18 0948    Subjective   Pt. arrived for an unscheduled OT visit today. Pt. was able to be seen  for part of a session.    Patient is accompained by:  Family member    Pertinent History  Pt. is a 77 y.o. female who suffered a Left temporla Lobe Hemorrhage secondary to Hypertensive Crisis, Moderate ICH, Left Frontal Trace SDH, and Traumatic ICH secondary to a fall. Pt. PMHX includes: HTN, DM with peripheral Neuropathy, Acute Kidney Injury, Hyperlipdidemia, and history of a GI Bleed. Pt. went the inpatient rehab at [facility], was discharged, and now is ready for outpatient rehab services.    Currently in Pain?  Yes    Pain Score  2  faces       OT TREATMENT     Neuro muscular re-education:  Pt. worked on grasping,  And flipping 2" large pegs on the Instructo board placed at a tabletop surface. Pt. required cognitive cues, and one step cues for  instruction. Pt. Reports she like to do things like this.                         OT Short Term Goals - 09/15/17 1153      OT SHORT TERM GOAL #1   Title  --    Baseline  --    Time  --    Period  --    Status  --    Target Date  --      OT SHORT TERM GOAL #2   Title  --    Baseline  --    Time  --    Period  --    Status  New    Target Date  --      OT SHORT TERM GOAL #3   Title  --    Baseline  --    Time  --    Period  --    Status  --    Target Date  --      OT SHORT TERM GOAL #4   Title  --    Baseline  --    Time  --    Period  --    Status  --    Target Date  --      OT SHORT TERM GOAL #5   Title  --    Baseline  --    Time  --    Period  --    Status  --    Target Date  --      OT  SHORT TERM GOAL #6   Title  --    Baseline  --    Time  --    Period  --    Status  --    Target Date  --      OT SHORT TERM GOAL #7   Title  --    Baseline  --    Time  --    Status  --    Target Date  --      OT SHORT TERM GOAL #8   Title  --    Baseline  --    Time  --    Period  --    Status  --    Target Date  --        OT Long Term Goals - 02/10/18 1610      OT LONG TERM GOAL #1   Title  Pt. will be independent with basic ADL care needs.    Baseline  Eval: Pt. requires assist, 11/13: Pt. requires minA UE, ModA LE, 12/09/2018: Pt. conitnues to require assist.    Time  12    Period  Weeks    Status  On-going    Target Date  03/03/18      OT LONG TERM GOAL #2   Title  Pt. will be independent with light meal preparation.    Baseline  Eval: Pt. is unable, 11/04/2017: Pt. is able to assist with light cold meal prep, ahowver requires assist,. 12/09/2018: Pt. continues to require assist    Time  12    Period  Weeks    Status  On-going    Target Date  03/03/18      OT LONG TERM GOAL #3   Title  Pt. will be independent with tub/shower transfers    Baseline  Eval: Pt. requires assist, 11/04/2017: pt. conitnues to require assist  using a shower seat.    Time  12    Period  Weeks    Status  On-going    Target Date  03/03/18      OT LONG TERM GOAL #4   Title  Pt. will increase BUE strength by 2 mm grades to assist with ADLs.    Baseline  Eval: BUE strength 4/5 overall. 12/09/2018: BUEs: shoulder flexion, and abduction 4/5, elbow flexion/extension: 4+/5    Time  12    Period  Weeks    Status  On-going    Target Date  03/03/18      OT LONG TERM GOAL #5   Title  Pt. will improve right [facility] skills to be able to assist with buttoning, and zipping.     Baseline  Eval: impaired, 11/04/2017:  Pt. continues to require work on improving [facility] skills for buttnoning, and zipping clothing. 12/08/2017: Pt. continues to require assist    Time  12    Period  Weeks    Status   On-going    Target Date  03/03/18      OT LONG TERM GOAL #6   Title  Pt. will improve right grip strength to be able to open containers.    Baseline  Eval: Limited. 11/04/2017: improving, 12/09/2017: Pt. continues to have difficulty    Time  12    Period  Weeks    Status  On-going    Target Date  03/03/18      OT LONG TERM GOAL #7   Title  Pt. will identify potential safety hazards accurately during ADLs, and IADLs with 100% accuracy.    Baseline  Eval: limited 11/04/2017: Conitnues to be impaired, 12/09/2017: Pt. requires frequent cues.    Time  12    Period  Weeks    Status  On-going    Target Date  03/03/18      OT LONG TERM GOAL #8   Title  Pt. will demonstrate cognitive compensatory techniques 100% of the time with minimal cues.    Baseline  Eval: limite11/13/2018: conitnues to be limited. 12/09/2018: Pt. continues to require assist.    Time  12    Period  Weeks    Status  On-going    Target Date  03/03/18            Plan - 02/10/18 0951    Clinical Impression Statement  Pt. continues to work on improving UE strength, coordination, andcognitive skills for improved ADLs, and IADL tasks. Pt. requires cognitive cues, and assist, increased time, and cues.      Occupational Profile and client history currently impacting functional performance  quires cues for safety as she attempts to leave her     Occupational performance deficits (Please refer to evaluation for details):  ADL's;IADL's    Rehab Potential  Good    OT Frequency  2x / week    OT Duration  12 weeks    OT Treatment/Interventions  Self-care/ADL training;Therapeutic activities;Therapeutic exercise    Clinical Decision Making  Limited treatment options, no task modification necessary    Consulted and Agree with Plan of Care  Patient       Patient will benefit from skilled therapeutic intervention in order to improve the following deficits and impairments:  Abnormal gait, Decreased activity tolerance, Decreased  cognition, Decreased balance, Decreased strength, Impaired UE functional use, Decreased coordination  Visit Diagnosis: Muscle weakness (generalized)  Other lack of coordination    Problem List Patient Active Problem  List   Diagnosis Date Noted  . Gait disturbance, post-stroke 09/04/2017  . Wernicke's aphasia 09/04/2017  . 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 However, needs to rule out CAA, tumor or CVT (vein of labbe)  09/01/2017  . Constipation 01/06/2017  . DJD (degenerative joint disease) of knee 07/18/2016  . GIB (gastrointestinal bleeding) 05/25/2016  . GI bleed 05/24/2016  . Degenerative joint disease (DJD) of hip 05/08/2016  . 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 Hypertensive heart disease   . Hemorrhage of gastrointestinal tract 11/13/2015  . Lumbosacral facet joint syndrome 07/26/2015  . Angioedema 07/13/2015  . DM2 (diabetes mellitus, type 2) (HCC) 07/13/2015  . Status post lumbar laminectomy 06/19/2015  . Lumbar radiculopathy 06/19/2015  . DDD (degenerative disc disease), lumbar 05/11/2015  . Facet syndrome, lumbar 05/11/2015  . Lumbar post-laminectomy syndrome 05/11/2015  . Sacroiliac joint disease 05/11/2015  . 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 Neuropathy due to secondary diabetes (HCC) 05/11/2015  . Essential hypertension 01/03/2015  . Coronary atherosclerosis of native coronary artery   . PAF (paroxysmal atrial fibrillation) (HCC) 10/20/2014  . Ischemic colitis (HCC) 10/20/2014  . Diabetes mellitus type 2 with complications (HCC) 10/20/2014  . COPD (chronic obstructive pulmonary disease) (HCC) 10/20/2014  . Hyperlipidemia 10/20/2014  . GERD (gastroesophageal reflux disease) 10/20/2014  . Ingrowing nail, right great toe 04/27/2014  . Internal hemorrhoid, bleeding 07/14/2013    Olegario Messier 02/10/2018, 9:58  AM  [place] [facility] MAIN [facility] SERVICES [address] [place], [place], [address] Phone: [phone]   Fax:  [phone]  Name: Yvette Collier MRN: [id number] Date of Birth: 23-Dec-1941

## 2018-02-12 ENCOUNTER — Encounter: Payer: Self-pay | Admitting: Occupational Therapy

## 2018-02-12 ENCOUNTER — Other Ambulatory Visit: Payer: Self-pay | Admitting: Gastroenterology

## 2018-02-12 ENCOUNTER — Ambulatory Visit: Payer: Medicare Other

## 2018-02-12 ENCOUNTER — Ambulatory Visit: Payer: Medicare Other | Admitting: Occupational Therapy

## 2018-02-12 DIAGNOSIS — M6281 Muscle weakness (generalized): Secondary | ICD-10-CM

## 2018-02-12 DIAGNOSIS — R278 Other lack of coordination: Secondary | ICD-10-CM

## 2018-02-12 NOTE — Therapy (Signed)
[facility] [facility] MAIN [facility] SERVICES [address] [place], [place], [address] Phone: [phone]   Fax:  [phone]  Occupational Therapy Treatment  Patient Details  Name: Yvette Collier MRN: [id number] Date of Birth: December 06, 1941 Referring Provider: BURNS, HARRIETT P   Encounter Date: 02/12/2018  OT End of Session - 02/12/18 0926    Visit Number  28    Number of Visits  48    Date for OT Re-Evaluation  03/03/18    OT Start Time  0922    OT Stop Time  1000    OT Time Calculation (min)  38 min    Activity Tolerance  Patient tolerated treatment well    Behavior During Therapy  [facility] for tasks assessed/performed       Past Medical History:  Diagnosis Date  . Asthma   . Chronic lower back pain   . COPD (chronic obstructive pulmonary disease) (HCC)   . Diabetes mellitus without complication (HCC)   . Gastrointestinal bleed   . GERD (gastroesophageal reflux disease)   . History of colon polyps   . Hypertension   . Hypertensive heart disease   . IBS (irritable bowel syndrome)   . Ischemic colitis (HCC)   . Non-obstructive Coronary Artery Disease    a. 05/2009 Cath [facility]): minimal nonobs dzs.  . Osteoarthritis   . PAF (paroxysmal atrial fibrillation) (HCC)    a. 09/2014 during GI illness;  b. CHA2DS2VASc = 5 (not currently on OAC 2/2 h/o GIB/ischemic colitis); c. 09/2014 Echo: EF 60-65%, imparied relaxation, nl RV size/fxn.  . Stroke (HCC)   . Transient cerebral ischemia due to atrial fibrillation [facility])     Past Surgical History:  Procedure Laterality Date  . ABDOMINAL HYSTERECTOMY    . APPENDECTOMY    . BACK SURGERY     x 3, last 2004.  . CHOLECYSTECTOMY    . COLONOSCOPY WITH PROPOFOL N/A 03/20/2017   Procedure: COLONOSCOPY WITH PROPOFOL;  Surgeon: Wyline Mood, MD;  Location: [facility] ENDOSCOPY;  Service: Endoscopy;  Laterality: N/A;  . ESOPHAGOGASTRODUODENOSCOPY (EGD) WITH PROPOFOL N/A 03/20/2017   Procedure: ESOPHAGOGASTRODUODENOSCOPY (EGD) WITH  PROPOFOL;  Surgeon: Wyline Mood, MD;  Location: [facility] ENDOSCOPY;  Service: Endoscopy;  Laterality: N/A;  . FOOT SURGERY Right   . HEMORRHOID BANDING    . left total hip arthroplasty  2012  . STOMACH SURGERY    . TONSILLECTOMY      There were no vitals filed for this visit.  Subjective Assessment - 02/12/18 0925    Subjective   Pt. reports that her stomach is upset today.    Patient is accompained by:  Family member    Pertinent History  Pt. is a 77 y.o. female who suffered a Left temporla Lobe Hemorrhage secondary to Hypertensive Crisis, Moderate ICH, Left Frontal Trace SDH, and Traumatic ICH secondary to a fall. Pt. PMHX includes: HTN, DM with peripheral Neuropathy, Acute Kidney Injury, Hyperlipdidemia, and history of a GI Bleed. Pt. went the inpatient rehab at [facility], was discharged, and now is ready for outpatient rehab services.    Patient Stated Goals  Patient reports she would like to do as much as she can for herself.    Currently in Pain?  No/denies      OT TREATMENT    Neuro muscular re-education:  Pt. worked on manipulating 1/2" flat marbles, and sorting them.    Therapeutic Exercise:  Pt. performed 2# dowel ex. For UE strengthening secondary to weakness. Bilateral shoulder flexion,  chest press, circular patterns, and elbow flexion/extension were performed.                        OT Education - 02/12/18 0926    Education provided  Yes    Education Details  [facility] skills, UE strength, and cognitive functioning.    Person(s) Educated  Patient    Methods  Explanation    Comprehension  Verbalized understanding         OT Long Term Goals - 02/10/18 0956      OT LONG TERM GOAL #1   Title  Pt. will be independent with basic ADL care needs.    Baseline  Eval: Pt. requires assist, 11/13: Pt. requires minA UE, ModA LE, 12/09/2018: Pt. conitnues to require assist.    Time  12    Period  Weeks    Status  On-going    Target Date  03/03/18      OT  LONG TERM GOAL #2   Title  Pt. will be independent with light meal preparation.    Baseline  Eval: Pt. is unable, 11/04/2017: Pt. is able to assist with light cold meal prep, ahowver requires assist,. 12/09/2018: Pt. continues to require assist    Time  12    Period  Weeks    Status  On-going    Target Date  03/03/18      OT LONG TERM GOAL #3   Title  Pt. will be independent with tub/shower transfers    Baseline  Eval: Pt. requires assist, 11/04/2017: pt. conitnues to require assist using a shower seat.    Time  12    Period  Weeks    Status  On-going    Target Date  03/03/18      OT LONG TERM GOAL #4   Title  Pt. will increase BUE strength by 2 mm grades to assist with ADLs.    Baseline  Eval: BUE strength 4/5 overall. 12/09/2018: BUEs: shoulder flexion, and abduction 4/5, elbow flexion/extension: 4+/5    Time  12    Period  Weeks    Status  On-going    Target Date  03/03/18      OT LONG TERM GOAL #5   Title  Pt. will improve right [facility] skills to be able to assist with buttoning, and zipping.     Baseline  Eval: impaired, 11/04/2017:  Pt. continues to require work on improving [facility] skills for buttnoning, and zipping clothing. 12/08/2017: Pt. continues to require assist    Time  12    Period  Weeks    Status  On-going    Target Date  03/03/18      OT LONG TERM GOAL #6   Title  Pt. will improve right grip strength to be able to open containers.    Baseline  Eval: Limited. 11/04/2017: improving, 12/09/2017: Pt. continues to have difficulty    Time  12    Period  Weeks    Status  On-going    Target Date  03/03/18      OT LONG TERM GOAL #7   Title  Pt. will identify potential safety hazards accurately during ADLs, and IADLs with 100% accuracy.    Baseline  Eval: limited 11/04/2017: Conitnues to be impaired, 12/09/2017: Pt. requires frequent cues.    Time  12    Period  Weeks    Status  On-going    Target Date  03/03/18  OT LONG TERM GOAL #8   Title  Pt. will demonstrate  cognitive compensatory techniques 100% of the time with minimal cues.    Baseline  Eval: limite11/13/2018: conitnues to be limited. 12/09/2018: Pt. continues to require assist.    Time  12    Period  Weeks    Status  On-going    Target Date  03/03/18            Plan - 02/12/18 1610    Clinical Impression Statement  Pt. reports having an appointment to follow-up about her back, and reports that she may have to have surgery. Pt. continues to require verbal cues, and assist to perfrom ADL, and IADL tasks.  Pt continues to to work on improving UE functioning, and cognition for improved ADL, and IADL functioning.    Occupational performance deficits (Please refer to evaluation for details):  ADL's;IADL's    Rehab Potential  Good    OT Frequency  2x / week    OT Duration  12 weeks    OT Treatment/Interventions  Self-care/ADL training;Therapeutic activities;Therapeutic exercise    Clinical Decision Making  Limited treatment options, no task modification necessary    Consulted and Agree with Plan of Care  Patient       Patient will benefit from skilled therapeutic intervention in order to improve the following deficits and impairments:  Abnormal gait, Decreased activity tolerance, Decreased cognition, Decreased balance, Decreased strength, Impaired UE functional use, Decreased coordination  Visit Diagnosis: Muscle weakness (generalized)  Other lack of coordination    Problem List Patient Active Problem List   Diagnosis Date Noted  . Gait disturbance, post-stroke 09/04/2017  . Wernicke's aphasia 09/04/2017  . 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 However, needs to rule out CAA, tumor or CVT (vein of labbe)  09/01/2017  . Constipation 01/06/2017  . DJD (degenerative joint disease) of knee 07/18/2016  . GIB (gastrointestinal bleeding) 05/25/2016  . GI bleed 05/24/2016  . Degenerative  joint disease (DJD) of hip 05/08/2016  . 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 Hypertensive heart disease   . Hemorrhage of gastrointestinal tract 11/13/2015  . Lumbosacral facet joint syndrome 07/26/2015  . Angioedema 07/13/2015  . DM2 (diabetes mellitus, type 2) (HCC) 07/13/2015  . Status post lumbar laminectomy 06/19/2015  . Lumbar radiculopathy 06/19/2015  . DDD (degenerative disc disease), lumbar 05/11/2015  . Facet syndrome, lumbar 05/11/2015  . Lumbar post-laminectomy syndrome 05/11/2015  . Sacroiliac joint disease 05/11/2015  . 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 Neuropathy due to secondary diabetes (HCC) 05/11/2015  . Essential hypertension 01/03/2015  . Coronary atherosclerosis of native coronary artery   . PAF (paroxysmal atrial fibrillation) (HCC) 10/20/2014  . Ischemic colitis (HCC) 10/20/2014  . Diabetes mellitus type 2 with complications (HCC) 10/20/2014  . COPD (chronic obstructive pulmonary disease) (HCC) 10/20/2014  . Hyperlipidemia 10/20/2014  . GERD (gastroesophageal reflux disease) 10/20/2014  . Ingrowing nail, right great toe 04/27/2014  . Internal hemorrhoid, bleeding 07/14/2013    Olegario Messier, MS, OTR/L 02/12/2018, 9:41 AM  [place] [facility] MAIN [facility] SERVICES [address] [place], [place], [address] Phone: [phone]   Fax:  [phone]  Name: Yvette Collier MRN: [id number] Date of Birth: 07/17/1941

## 2018-02-16 ENCOUNTER — Ambulatory Visit
Admission: RE | Admit: 2018-02-16 | Discharge: 2018-02-16 | Disposition: A | Discharge status: Home / Self Care | Payer: Medicare Other | Source: Ambulatory Visit | Attending: Diagnostic Neuroimaging | Admitting: Diagnostic Neuroimaging

## 2018-02-16 ENCOUNTER — Encounter: Payer: Self-pay | Admitting: Emergency Medicine

## 2018-02-16 ENCOUNTER — Emergency Department: Payer: Medicare Other

## 2018-02-16 ENCOUNTER — Other Ambulatory Visit: Payer: Self-pay

## 2018-02-16 ENCOUNTER — Emergency Department
Admission: EM | Admit: 2018-02-16 | Discharge: 2018-02-16 | Disposition: A | Discharge status: Other Acute Inpatient Hospital | Payer: Medicare Other | Attending: Emergency Medicine | Admitting: Emergency Medicine

## 2018-02-16 DIAGNOSIS — I629 Nontraumatic intracranial hemorrhage, unspecified: Secondary | ICD-10-CM | POA: Diagnosis not present

## 2018-02-16 DIAGNOSIS — G939 Disorder of brain, unspecified: Secondary | ICD-10-CM | POA: Diagnosis not present

## 2018-02-16 DIAGNOSIS — J45909 Unspecified asthma, uncomplicated: Secondary | ICD-10-CM | POA: Insufficient documentation

## 2018-02-16 DIAGNOSIS — I2510 Atherosclerotic heart disease of native coronary artery without angina pectoris: Secondary | ICD-10-CM | POA: Diagnosis not present

## 2018-02-16 DIAGNOSIS — Z79899 Other long term (current) drug therapy: Secondary | ICD-10-CM | POA: Insufficient documentation

## 2018-02-16 DIAGNOSIS — E119 Type 2 diabetes mellitus without complications: Secondary | ICD-10-CM | POA: Insufficient documentation

## 2018-02-16 DIAGNOSIS — R4189 Other symptoms and signs involving cognitive functions and awareness: Secondary | ICD-10-CM | POA: Diagnosis not present

## 2018-02-16 DIAGNOSIS — I10 Essential (primary) hypertension: Secondary | ICD-10-CM | POA: Diagnosis not present

## 2018-02-16 DIAGNOSIS — R55 Syncope and collapse: Secondary | ICD-10-CM | POA: Diagnosis present

## 2018-02-16 DIAGNOSIS — J449 Chronic obstructive pulmonary disease, unspecified: Secondary | ICD-10-CM | POA: Diagnosis not present

## 2018-02-16 DIAGNOSIS — R9089 Other abnormal findings on diagnostic imaging of central nervous system: Secondary | ICD-10-CM

## 2018-02-16 DIAGNOSIS — I611 Nontraumatic intracerebral hemorrhage in hemisphere, cortical: Secondary | ICD-10-CM

## 2018-02-16 LAB — BLOOD GAS, ARTERIAL
ACID-BASE EXCESS: 1.9 mmol/L (ref 0.0–2.0)
Bicarbonate: 23.6 mmol/L (ref 20.0–28.0)
FIO2: 1
LHR: 18 {breaths}/min
MECHVT: 450 mL
O2 Saturation: 99.3 %
PATIENT TEMPERATURE: 37
PEEP: 5 cmH2O
PH ART: 7.55 — AB (ref 7.350–7.450)
PO2 ART: 133 mmHg — AB (ref 83.0–108.0)
pCO2 arterial: 27 mmHg — ABNORMAL LOW (ref 32.0–48.0)

## 2018-02-16 LAB — URINE DRUG SCREEN, QUALITATIVE (ARMC ONLY)
Amphetamines, Ur Screen: NOT DETECTED
Barbiturates, Ur Screen: NOT DETECTED
Benzodiazepine, Ur Scrn: POSITIVE — AB
CANNABINOID 50 NG, UR ~~LOC~~: NOT DETECTED
COCAINE METABOLITE, UR ~~LOC~~: NOT DETECTED
MDMA (Ecstasy)Ur Screen: NOT DETECTED
Methadone Scn, Ur: NOT DETECTED
OPIATE, UR SCREEN: NOT DETECTED
PHENCYCLIDINE (PCP) UR S: NOT DETECTED
Tricyclic, Ur Screen: NOT DETECTED

## 2018-02-16 LAB — URINALYSIS, ROUTINE W REFLEX MICROSCOPIC
BACTERIA UA: NONE SEEN
Bilirubin Urine: NEGATIVE
Glucose, UA: >=500 mg/dL — AB
Hgb urine dipstick: NEGATIVE
Ketones, ur: 5 mg/dL — AB
Leukocytes, UA: NEGATIVE
Nitrite: NEGATIVE
PH: 7 (ref 5.0–8.0)
Protein, ur: NEGATIVE mg/dL
RBC / HPF: NONE SEEN RBC/hpf (ref 0–5)
SPECIFIC GRAVITY, URINE: 1.006 (ref 1.005–1.030)

## 2018-02-16 LAB — DIFFERENTIAL
Basophils Absolute: 0.1 10*3/uL (ref 0–0.1)
Basophils Relative: 1 %
Eosinophils Absolute: 0.2 10*3/uL (ref 0–0.7)
Eosinophils Relative: 3 %
LYMPHS PCT: 40 %
Lymphs Abs: 2.3 10*3/uL (ref 1.0–3.6)
MONO ABS: 0.4 10*3/uL (ref 0.2–0.9)
Monocytes Relative: 8 %
NEUTROS ABS: 2.7 10*3/uL (ref 1.4–6.5)
Neutrophils Relative %: 48 %

## 2018-02-16 LAB — CBC
HEMATOCRIT: 34.6 % — AB (ref 35.0–47.0)
HEMOGLOBIN: 11.3 g/dL — AB (ref 12.0–16.0)
MCH: 29.8 pg (ref 26.0–34.0)
MCHC: 32.8 g/dL (ref 32.0–36.0)
MCV: 90.9 fL (ref 80.0–100.0)
Platelets: 256 10*3/uL (ref 150–440)
RBC: 3.81 MIL/uL (ref 3.80–5.20)
RDW: 12.9 % (ref 11.5–14.5)
WBC: 5.7 10*3/uL (ref 3.6–11.0)

## 2018-02-16 LAB — APTT: aPTT: 35 seconds (ref 24–36)

## 2018-02-16 LAB — COMPREHENSIVE METABOLIC PANEL
ALK PHOS: 111 U/L (ref 38–126)
ALT: 11 U/L — AB (ref 14–54)
AST: 25 U/L (ref 15–41)
Albumin: 4.2 g/dL (ref 3.5–5.0)
Anion gap: 10 (ref 5–15)
BUN: 21 mg/dL — ABNORMAL HIGH (ref 6–20)
CALCIUM: 9.5 mg/dL (ref 8.9–10.3)
CO2: 24 mmol/L (ref 22–32)
CREATININE: 0.98 mg/dL (ref 0.44–1.00)
Chloride: 107 mmol/L (ref 101–111)
GFR calc Af Amer: >60 mL/min (ref >60)
GFR calc non Af Amer: 54 mL/min — ABNORMAL LOW (ref >60)
GLUCOSE: 154 mg/dL — AB (ref 65–99)
Potassium: 4.3 mmol/L (ref 3.5–5.1)
SODIUM: 141 mmol/L (ref 135–145)
Total Bilirubin: 0.3 mg/dL (ref 0.3–1.2)
Total Protein: 7.8 g/dL (ref 6.5–8.1)

## 2018-02-16 LAB — GLUCOSE, CAPILLARY: Glucose-Capillary: 141 mg/dL — ABNORMAL HIGH (ref 65–99)

## 2018-02-16 LAB — PROTIME-INR
INR: 0.97
Prothrombin Time: 12.8 seconds (ref 11.4–15.2)

## 2018-02-16 LAB — TROPONIN I: Troponin I: <0.03 ng/mL (ref <0.03)

## 2018-02-16 MED ORDER — LABETALOL HCL 5 MG/ML IV SOLN
INTRAVENOUS | Status: AC
  Filled 2018-02-16: qty 4

## 2018-02-16 MED ORDER — MANNITOL 25 % IV SOLN
50.0000 g | Freq: Once | INTRAVENOUS | Status: AC
  Administered 2018-02-16: 50 g via INTRAVENOUS
  Filled 2018-02-16 (×2): qty 200

## 2018-02-16 MED ORDER — SODIUM CHLORIDE 0.9 % IV SOLN
0.5000 mg/h | INTRAVENOUS | Status: DC
  Administered 2018-02-16: 2 mg/h via INTRAVENOUS
  Filled 2018-02-16: qty 10

## 2018-02-16 MED ORDER — LABETALOL HCL 5 MG/ML IV SOLN: 5.0000 mg | Freq: Once | INTRAVENOUS | Status: DC

## 2018-02-16 MED ORDER — FENTANYL CITRATE (PF) 2500 MCG/50ML IJ SOLN
0.0000 ug/h | INTRAMUSCULAR | Status: DC
  Filled 2018-02-16: qty 50

## 2018-02-16 MED ORDER — FENTANYL CITRATE (PF) 100 MCG/2ML IJ SOLN
INTRAMUSCULAR | Status: AC | PRN
  Administered 2018-02-16: 50 ug via INTRAVENOUS

## 2018-02-16 MED ORDER — ETOMIDATE 2 MG/ML IV SOLN
INTRAVENOUS | Status: AC | PRN
  Administered 2018-02-16: 20 mg via INTRAVENOUS

## 2018-02-16 MED ORDER — LABETALOL HCL 5 MG/ML IV SOLN
5.0000 mg | INTRAVENOUS | Status: DC | PRN
  Administered 2018-02-16: 5 mg via INTRAVENOUS

## 2018-02-16 MED ORDER — ROCURONIUM BROMIDE 50 MG/5ML IV SOLN
INTRAVENOUS | Status: AC | PRN
  Administered 2018-02-16: 70 mg via INTRAVENOUS

## 2018-02-16 MED ORDER — ESMOLOL HCL-SODIUM CHLORIDE 2000 MG/100ML IV SOLN
25.0000 ug/kg/min | Freq: Once | INTRAVENOUS | Status: AC
  Administered 2018-02-16: 25 ug/kg/min via INTRAVENOUS
  Filled 2018-02-16: qty 100

## 2018-02-16 MED ORDER — NICARDIPINE HCL IN NACL 20-0.86 MG/200ML-% IV SOLN
0.0000 mg/h | INTRAVENOUS | Status: DC
  Administered 2018-02-16: 5 mg/h via INTRAVENOUS
  Filled 2018-02-16: qty 200

## 2018-02-16 MED ORDER — FENTANYL 2500MCG IN NS 250ML (10MCG/ML) PREMIX INFUSION
0.0000 ug/h | INTRAVENOUS | Status: DC
  Administered 2018-02-16: 25 ug/h via INTRAVENOUS
  Filled 2018-02-16: qty 250

## 2018-02-16 MED ORDER — SODIUM CHLORIDE 0.9 % IV SOLN
1000.0000 mg | Freq: Once | INTRAVENOUS | Status: AC
  Administered 2018-02-16: 1000 mg via INTRAVENOUS
  Filled 2018-02-16: qty 5

## 2018-02-16 MED ORDER — NALOXONE HCL 2 MG/2ML IJ SOSY
PREFILLED_SYRINGE | INTRAMUSCULAR | Status: AC
  Filled 2018-02-16: qty 2

## 2018-02-16 NOTE — ED Provider Notes (Signed)
Received call from [facility] transfer center stating that they would no longer be able to accept the patient because there were no ICU beds available at this time.  Initial plan was that the patient was going to be discharged from the ICU or transfer to a floor bed.  However, they were unable to move the patient out of the ICU.  I discussed further transfer plans with [facility], [facility]  [facility] and patient was accepted through the ER by Dr. Herby AbrahamPariyadeth.  Patient remains on nicardipine drip.  Family aware [facility] and [facility].   Pershing ProudSchaevitz, Myra Rudeavid Matthew, MD 02/16/18 [phone]

## 2018-02-16 NOTE — ED Provider Notes (Addendum)
[facility] Emergency Department Provider Note  ____________________________________________  Time seen: Approximately 2:23 PM  I have reviewed the triage vital signs and the nursing notes.   HISTORY  Chief Complaint Cerebrovascular Accident    HPI MYYA MEENACH is a 77 y.o. female with a history of parietal intracranial hemorrhage 0 9/18 presenting with unresponsiveness.  The patient was in the hospital with plans for an outpatient MRI when she collapsed prior to her imaging.  She was immediately brought to the emergency department where she had a GCS of 3, left lateral gaze deviation, and was flaccid in all 4 extremities.  She was not protecting her airway and was immediately intubated.  The patient was accompanied by her daughter, who states that she had been in her usual state of health until this episode occurred.  She had not had any recent significant events.  Past Medical History:  Diagnosis Date  . Asthma   . Chronic lower back pain   . COPD (chronic obstructive pulmonary disease) ([place])   . Diabetes mellitus without complication ([place])   . Gastrointestinal bleed   . GERD (gastroesophageal reflux disease)   . History of colon polyps   . Hypertension   . Hypertensive heart disease   . IBS (irritable bowel syndrome)   . Ischemic colitis ([place])   . Non-obstructive Coronary Artery Disease    a. 05/2009 [facility]): minimal nonobs dzs.  . Osteoarthritis   . PAF (paroxysmal atrial fibrillation) ([place])    a. 09/2014 during GI illness;  b. CHA2DS2VASc = 5 (not currently on [place] 2/2 h/o GIB/ischemic colitis); c. 09/2014 Echo: EF 60-65%, imparied relaxation, nl RV size/fxn.  . Stroke ([place])   . Transient cerebral ischemia due to atrial fibrillation [facility])     Patient Active Problem List   Diagnosis Date Noted  . Gait disturbance, post-stroke 09/04/2017  . Wernicke's aphasia 09/04/2017  . Left temporal lobe hemorrhage (HCC) -  left temporal moderate ICH and  left frontal trace SDH, concerning for traumatic ICH due to fall. However, needs to rule out CAA, tumor or CVT (vein of labbe)  09/01/2017  . Constipation 01/06/2017  . DJD (degenerative joint disease) of knee 07/18/2016  . GIB (gastrointestinal bleeding) 05/25/2016  . GI bleed 05/24/2016  . Degenerative joint disease (DJD) of hip 05/08/2016  . 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 Hypertensive heart disease   . Hemorrhage of gastrointestinal tract 11/13/2015  . Lumbosacral facet joint syndrome 07/26/2015  . Angioedema 07/13/2015  . DM2 (diabetes mellitus, type 2) ([place]) 07/13/2015  . Status post lumbar laminectomy 06/19/2015  . Lumbar radiculopathy 06/19/2015  . DDD (degenerative disc disease), lumbar 05/11/2015  . Facet syndrome, lumbar 05/11/2015  . Lumbar post-laminectomy syndrome 05/11/2015  . Sacroiliac joint disease 05/11/2015  . 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 Neuropathy due to secondary diabetes ([place]) 05/11/2015  . Essential hypertension 01/03/2015  . Coronary atherosclerosis of native coronary artery   . PAF (paroxysmal atrial fibrillation) ([place]) 10/20/2014  . Ischemic colitis ([place]) 10/20/2014  . Diabetes mellitus type 2 with complications ([place]) 74/11/8785  . COPD (chronic obstructive pulmonary disease) ([place]) 10/20/2014  . Hyperlipidemia 10/20/2014  . GERD (gastroesophageal reflux disease) 10/20/2014  . Ingrowing nail, right great toe 04/27/2014  . Internal hemorrhoid, bleeding 07/14/2013    Past Surgical History:  Procedure Laterality Date  . ABDOMINAL HYSTERECTOMY    . APPENDECTOMY    . BACK SURGERY     x 3, last 2004.  Marland Kitchen  CHOLECYSTECTOMY    . COLONOSCOPY WITH PROPOFOL N/A 03/20/2017   Procedure: COLONOSCOPY WITH PROPOFOL;  Surgeon: Jonathon Bellows, MD;  Location: [facility] ENDOSCOPY;  Service: Endoscopy;  Laterality: N/A;  . ESOPHAGOGASTRODUODENOSCOPY (EGD) WITH PROPOFOL N/A 03/20/2017   Procedure: ESOPHAGOGASTRODUODENOSCOPY (EGD) WITH  PROPOFOL;  Surgeon: Jonathon Bellows, MD;  Location: [facility] ENDOSCOPY;  Service: Endoscopy;  Laterality: N/A;  . FOOT SURGERY Right   . HEMORRHOID BANDING    . left total hip arthroplasty  2012  . STOMACH SURGERY    . TONSILLECTOMY      Current Outpatient Rx  . Order #: [id number] Class: Historical Med  . Order #: [id number] Class: Historical Med  . Order #: [id number] Class: Historical Med  . Order #: [id number] Class: Normal  . Order #: [id number] Class: Historical Med  . Order #: [id number] Class: Print  . Order #: [id number] Class: Normal  . Order #: [id number] Class: Print  . Order #: [id number] Class: Print    Allergies Lisinopril; Baclofen; Prednisone; Hctz [hydrochlorothiazide]; Ivp dye [iodinated diagnostic agents]; Venlafaxine; and Sulfa antibiotics  Family History  Problem Relation Age of Onset  . Heart attack Mother   . Hypertension Mother   . Cancer Mother   . Heart disease Father   . Hypertension Father   . Heart attack Father   . Diabetes Father   . Cancer Father   . Hypertension Brother   . Diabetes Brother   . Hypertension Maternal Aunt     Social History Social History   Tobacco Use  . Smoking status: Never Smoker  . Smokeless tobacco: Never Used  Substance Use Topics  . Alcohol use: No  . Drug use: No    Review of Systems We will to obtain due to patient GCS of 3 ____________________________________________   PHYSICAL EXAM:  VITAL SIGNS: ED Triage Vitals  Enc Vitals Group     BP 02/16/18 1401 (!) 221/88     Pulse Rate 02/16/18 1401 80     Resp --      Temp 02/16/18 1401 98.1 F (36.7 C)     Temp Source 02/16/18 1401 Oral     SpO2 02/16/18 1401 100 %     Weight 02/16/18 1405 160 lb (72.6 kg)     Height 02/16/18 1405 _0  (1.651 m)     Head Circumference --      Peak Flow --      Pain Score --      Pain Loc --      Pain Edu? --      Excl. in [place]? --     Constitutional: The patient is unresponsive and does not respond to painful stimulus.  She does  not following commands.  Her extremities are flaccid.  GCS is 3. Eyes: Conjunctivae are normal.  Left lateral gaze.  The pupils are symmetrically 3 mm and sluggish but reactive bilaterally.. No scleral icterus. Head: Atraumatic. Nose: No congestion/rhinnorhea. Mouth/Throat: Mucous membranes are moist.  Neck: No stridor.  Supple.  No JVD. Cardiovascular: Normal rate, regular rhythm. No murmurs, rubs or gallops.  Respiratory: Normal respiratory effort.  No accessory muscle use or retractions. Lungs CTAB.  No wheezes, rales or ronchi. Gastrointestinal: Soft, nontender and nondistended.  No guarding or rebound.  No peritoneal signs. Musculoskeletal: Bilateral symmetric lower extremity edema.   Neurologic: the patient is unresponsive.  She does not withdraw to painful stimulus, including sternal rub or pinching of the great toes.  She has no gag reflex.  She has a left lateral gaze with pupils that are  equal and sluggish but reactive. Skin:  Skin is warm, dry and intact. No rash noted. Psychiatric: Unable to assess  ____________________________________________   LABS (all labs ordered are listed, but only abnormal results are displayed)  Labs Reviewed  GLUCOSE, CAPILLARY - Abnormal; Notable for the following components:      Result Value   Glucose-Capillary 141 (*)    All other components within normal limits  CBC - Abnormal; Notable for the following components:   Hemoglobin 11.3 (*)    HCT 34.6 (*)    All other components within normal limits  COMPREHENSIVE METABOLIC PANEL - Abnormal; Notable for the following components:   Glucose, Bld 154 (*)    BUN 21 (*)    ALT 11 (*)    GFR calc non Af Amer 54 (*)    All other components within normal limits  BLOOD GAS, ARTERIAL - Abnormal; Notable for the following components:   pH, Arterial 7.55 (*)    pCO2 arterial 27 (*)    pO2, Arterial 133 (*)    All other components within normal limits  PROTIME-INR  APTT  DIFFERENTIAL  TROPONIN I   URINE DRUG SCREEN, QUALITATIVE ([facility] ONLY)  URINALYSIS, ROUTINE W REFLEX MICROSCOPIC   ____________________________________________  EKG  ED ECG REPORT I, Eula Listen, the attending physician, personally viewed and interpreted this ECG.   Date: 02/16/2018  EKG Time: 1355  Rate: 68  Rhythm: normal sinus rhythm  Axis: leftward  Intervals:none  ST&T Change: No STEMI  ____________________________________________  RADIOLOGY  Dg Chest Portable 1 View  Result Date: 02/16/2018 CLINICAL DATA:  Tube placement EXAM: PORTABLE CHEST 1 VIEW COMPARISON:  09/08/2017 FINDINGS: Endotracheal tube tip at the carina and is directed toward the right mainstem bronchus. There are low lung volumes. Esophageal tube tip is below the diaphragm but is not included. Borderline to mild cardiomegaly. No focal pulmonary opacity. No pneumothorax. IMPRESSION: 1. Endotracheal tube tip at the carina and is directed toward right mainstem bronchus 2. Low lung volumes with borderline cardiomegaly. Electronically Signed   By: Donavan Foil M.D.   On: 02/16/2018 14:28   Ct Head Code Stroke Wo Contrast  Addendum Date: 02/16/2018   ADDENDUM REPORT: 02/16/2018 14:49 ADDENDUM: Critical Value/emergent results were called by telephone at the time of interpretation on 02/16/2018 at 1440 hrs. to Dr. Eula Listen , who verbally acknowledged these results. Electronically Signed   By: Genevie Ann M.D.   On: 02/16/2018 14:49   Result Date: 02/16/2018 CLINICAL DATA:  Code stroke. 77 year old female with weakness and altered mental status progressing to unresponsive. Intubated. Prior intracranial hemorrhage in September 2018. EXAM: CT HEAD WITHOUT CONTRAST TECHNIQUE: Contiguous axial images were obtained from the base of the skull through the vertex without intravenous contrast. COMPARISON:  09/03/2017 and earlier. FINDINGS: Brain: Large left hemisphere hyperdense intracranial hemorrhage which appears to have originated today  in the left superior frontal gyrus. Estimated intra-axial hemorrhage volume of 57 milliliters, with hyperdense blood encompassing 52 x 45 by 49 millimeters (AP by transverse by CC). Secondary extension into both the subarachnoid space and the left lateral ventricle with moderate to large volume of intraventricular hemorrhage. Comparatively small volume of left convexity subarachnoid hemorrhage. No subdural or epidural blood identified. Ventriculomegaly with transependymal edema suspected. Intracranial mass effect with both rightward midline shift of 10 millimeters and partial generalized effacement of the basilar cisterns (sagittal image 25). The left lateral temporal lobe hemorrhage site demonstrates encephalomalacia as expected. Stable gray-Arganbright matter differentiation in the right hemisphere  and posterior fossa except for transependymal edema. Vascular: No suspicious intracranial vascular hyperdensity. Skull: Stable and intact. Sinuses/Orbits: Stable and well pneumatized. Other: No acute orbit or scalp soft tissue findings. ASPECTS [facility] Stroke Program Early CT Score): Not applicable, acute hemorrhage. IMPRESSION: 1. Large volume acute intracranial hemorrhage. Left superior frontal gyrus intra-axial hemorrhage (estimated intra-axial volume 57 mL) with moderate to large volume extension into the ventricles, and small volume extension into the left subarachnoid space. Considering the prior left temporal lobar hemorrhage in 2018 the top differential considerations include amyloid angiopathy, and underlying coagulopathy. 2. Partial effacement of the basilar cisterns and rightward midline shift of 10 mm. Ventriculomegaly with transependymal edema. 3. ASPECTS is not applicable, acute hemorrhage. Electronically Signed: By: Genevie Ann M.D. On: 02/16/2018 14:36    ____________________________________________   PROCEDURES  Procedure(s) performed: None  Procedure Name: Intubation Date/Time: 02/16/2018 2:15  PM Performed by: Eula Listen, MD Pre-anesthesia Checklist: Patient identified, Patient being monitored, Emergency Drugs available, Timeout performed and Suction available Oxygen Delivery Method: Ambu bag Preoxygenation: Pre-oxygenation with 100% oxygen Induction Type: Rapid sequence Ventilation: Mask ventilation without difficulty Laryngoscope Size: Mac and 4 Tube size: 7.5 mm Number of attempts: 1 Airway Equipment and Method: Stylet Placement Confirmation: ETT inserted through vocal cords under direct vision,  CO2 detector and Breath sounds checked- equal and bilateral Secured at: 26 cm Tube secured with: Tape Comments: The tube was placed at 25 cm at the lip, but taped to 26 cm at the lip.  Immediately after in the room x-ray, the tube was seen to be at the carina so pulled back by 1 cm.  The patient tolerated the procedure well and maintained O2 sats of 100% during the entirety of the procedure.        Critical Care performed: Yes, see critical care note(s) ____________________________________________   INITIAL IMPRESSION / ASSESSMENT AND PLAN / ED COURSE  Pertinent labs & imaging results that were available during my care of the patient were reviewed by me and considered in my medical decision making (see chart for details).  77 y.o. female with a history of prior parietal stroke who became unresponsive prior to outpatient MRI imaging.  On arrival to the emergency department, the patient had a GCS of 3 with a left lateral gaze, no gag reflex, and flaccid extremities bilaterally.  She did not respond to painful stimulus.  She was immediately intubated for airway protection, and brought to CT for further evaluation.  She was found to have a large right frontal intracranial hemorrhage with significant blood in the ventricles, as well as subarachnoid hemorrhage and midline shift.  She was hypertensive and an esmolol drip was initiated to treat her hypertension.  Dr.  Doy Mince, the neurologist on call, was available at bedside and initiated transfer attempt to [facility].  Unfortunately, the hospital was full and no neuro ICU beds were available.  [facility] is also full.  Dr. Lysle Morales, though neurosurgeon for [place] who covers at [facility] regional was called; he was not on campus but sent his PA for evaluation as well.  Transfer to [facility] was initiated and the patient was accepted for transfer.  The patient's family has been updated regularly about her course and prognosis.  The patient was given Keppra and mannitol.  The patient was signed out to Dr. Clearnce Hasten for continued monitoring while awaiting transport.  CRITICAL CARE Performed by: Eula Listen   Total critical care time: 120 minutes  Critical care time was exclusive of separately billable  procedures and treating other patients.  Critical care was necessary to treat or prevent imminent or life-threatening deterioration.  Critical care was time spent personally by me on the following activities: development of treatment plan with patient and/or surrogate as well as nursing, discussions with consultants, evaluation of patient's response to treatment, examination of patient, obtaining history from patient or surrogate, ordering and performing treatments and interventions, ordering and review of laboratory studies, ordering and review of radiographic studies, pulse oximetry and re-evaluation of patient's condition.   ____________________________________________  FINAL CLINICAL IMPRESSION(S) / ED DIAGNOSES  Final diagnoses:  Intracranial hemorrhage ([place])  Midline shift of brain  Unresponsive         NEW MEDICATIONS STARTED DURING THIS VISIT:  New Prescriptions   No medications on file      Eula Listen, MD 02/16/18 1709    Eula Listen, MD 02/16/18 1710

## 2018-02-16 NOTE — Progress Notes (Signed)
   02/16/18 1415  Clinical Encounter Type  Visited With Family;Patient not available  Visit Type Code  Referral From Nurse  Consult/Referral To Chaplain  Spiritual Encounters  Spiritual Needs Prayer;Emotional   CH received a CD Stroke. CH reported to PT's RM to find PT's daughter watching as Care team worked with PT. [facility] and [facility] to [facility].

## 2018-02-16 NOTE — ED Notes (Signed)
Pt's daughter Pearson ForsterClaudia Reid signed ED Transfer consent, pt intubated at this time

## 2018-02-16 NOTE — ED Notes (Signed)
[facility]

## 2018-02-16 NOTE — Progress Notes (Signed)
Pt was transported to CT from ED and back while on the ventilator.

## 2018-02-16 NOTE — ED Notes (Signed)
EMTALA checked for completion  

## 2018-02-16 NOTE — Code Documentation (Signed)
Pt arrives from MRI, pt was scheduled for outpatient MRI for confusion and hallucinations, while in MRI pt used the bathroom, when exiting bathroom pt became more confused with right sided weakness, pt brought to ER room 6 and intubated, code stroke initiated, pt taken to CT for non-contrast head CT, no tPA due to ICH and hx of same, daughter brought to bedside and updated on results, awaiting neurosurgical consult for disposition decision and recommendations, report off to [employer] RN

## 2018-02-16 NOTE — ED Notes (Signed)
cbg [id number] 

## 2018-02-16 NOTE — ED Notes (Signed)
CODE STROKE 

## 2018-02-16 NOTE — Progress Notes (Signed)
Chaplain responded to code. Family was in family waiting. Chaplain prayed with daughter and came back with Dr. To give diagnosis. Will follow up.   02/16/18 1400  Clinical Encounter Type  Visited With Family  Visit Type Initial;Spiritual support  Referral From Nurse  Spiritual Encounters  Spiritual Needs Prayer

## 2018-02-16 NOTE — Consult Note (Signed)
Referring Physician: Sharma Collier    Chief Complaint: Loss of conscsiousness  HPI: Yvette Collier is an 77 y.o. 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  Patient has been having hallucinations.  Went to the bathroom in preparation for MRI and on coming out of the bathroom patient was dizzy and collapsed.  Patient was brought to the ED where she was unresponsive, leaning to the right and with a left facial droop.  GCS of 3 requiring intubation.  Code stroke called once stabilized.    Date last known well: Date: 02/16/2018 Time last known well: Time: 12:30 tPA Given: No: ICH  ICH Score: 4       Past Medical History:  Diagnosis Date  . Asthma   . Chronic lower back pain   . COPD (chronic obstructive pulmonary disease) (HCC)   . Diabetes mellitus without complication (HCC)   . Gastrointestinal bleed   . GERD (gastroesophageal reflux disease)   . History of colon polyps   . Hypertension   . Hypertensive heart disease   . IBS (irritable bowel syndrome)   . Ischemic colitis (HCC)   . Non-obstructive Coronary Artery Disease    a. 05/2009 Cath [facility]): minimal nonobs dzs.  . Osteoarthritis   . PAF (paroxysmal atrial fibrillation) (HCC)    a. 09/2014 during GI illness;  b. CHA2DS2VASc = 5 (not currently on OAC 2/2 h/o GIB/ischemic colitis); c. 09/2014 Echo: EF 60-65%, imparied relaxation, nl RV size/fxn.  . Stroke (HCC)   . Transient cerebral ischemia due to atrial fibrillation [facility](HCC)     Past Surgical History:  Procedure Laterality Date  . ABDOMINAL HYSTERECTOMY    . APPENDECTOMY    . BACK SURGERY     x 3, last 2004.  . CHOLECYSTECTOMY    . COLONOSCOPY WITH PROPOFOL N/A 03/20/2017   Procedure: COLONOSCOPY WITH PROPOFOL;  Surgeon: Wyline MoodKiran Anna, MD;  [facility];  Service: Endoscopy;  Laterality: N/A;  . ESOPHAGOGASTRODUODENOSCOPY (EGD) WITH PROPOFOL N/A 03/20/2017   Procedure:  ESOPHAGOGASTRODUODENOSCOPY (EGD) WITH PROPOFOL;  Surgeon: Wyline MoodKiran Anna, MD;  Location: [facility] ENDOSCOPY;  Service: Endoscopy;  Laterality: N/A;  . FOOT SURGERY Right   . HEMORRHOID BANDING    . left total hip arthroplasty  2012  . STOMACH SURGERY    . TONSILLECTOMY      Family History  Problem Relation Age of Onset  . Heart attack Mother   . Hypertension Mother   . Cancer Mother   . Heart disease Father   . Hypertension Father   . Heart attack Father   . Diabetes Father   . Cancer Father   . Hypertension Brother   . Diabetes Brother   . Hypertension Maternal Aunt    Social History:  reports that  has never smoked. she has never used smokeless tobacco. She reports that she does not drink alcohol or use drugs.  Allergies:  Allergies  Allergen Reactions  . Lisinopril Swelling and Other (See Comments)    Reaction:  Tongue/mouth swelling   . Baclofen   . Prednisone Other (See Comments)    unspecified  . Hctz [Hydrochlorothiazide] Other (See Comments)    unknown  . Ivp Dye [Iodinated Diagnostic Agents] Hives    "messes with her heart"  . Venlafaxine Hives and Other (See Comments)    Hyperactive, "messes with her heart"  . Sulfa Antibiotics Hives and Rash    Medications: I  have reviewed the patient's current medications. Prior to Admission:  Prior to Admission medications   Medication Sig Start Date End Date Taking? Authorizing Provider  Acetaminophen (TYLENOL 8 HOUR PO) Take by mouth. Extra strength 500 mg as needed    [provider]  cetirizine (ZYRTEC) 10 MG tablet Take 10 mg by mouth daily.    [provider]  Cholecalciferol (VITAMIN D3) 1000 units CAPS Take 1,000 Units by mouth daily.    [provider]  DEXILANT 60 MG capsule TAKE ONE (1) CAPSULE EACH DAY FOR ACID REFLUX Patient taking differently: TAKE 60 MG EACH DAY FOR ACID REFLUX 06/13/17   Midge Minium, MD  dicyclomine (BENTYL) 10 MG capsule TAKE 1 CAPSULE BY MOUTH 4 TIMES DAILY BEFORE  MEALS AND AT BEDTIME 02/13/18   Wyline Mood, MD  lidocaine (LIDODERM) 5 %  12/25/17   [provider]  LOSARTAN POTASSIUM PO Take 50 mg by mouth daily.    [provider]  metoprolol tartrate (LOPRESSOR) 25 MG tablet Take 0.5 tablets (12.5 mg total) by mouth 2 (two) times daily. 09/12/17   Angiulli, Mcarthur Rossetti, PA-C  pregabalin (LYRICA) 25 MG capsule Limit  1 tablet by mouth per day or twice per day if tolerated 09/12/17   Angiulli, Mcarthur Rossetti, PA-C  Probiotic Product (VSL#3) CAPS Take 1 capsule by mouth 2 (two) times daily. 11/27/17   Midge Minium, MD  simvastatin (ZOCOR) 10 MG tablet Take 1 tablet (10 mg total) by mouth every evening. 09/12/17   Angiulli, Mcarthur Rossetti, PA-C    ROS: Unable to provide due to mental status  Physical Examination: Blood pressure (!) 221/88, pulse 80, temperature 98.1 F (36.7 C), temperature source Oral, height 5\' 5"  (1.651 m), weight 72.6 kg (160 lb), SpO2 100 %.  HEENT-  Normocephalic, no lesions, without obvious abnormality.  Normal external eye and conjunctiva.  Normal TM's bilaterally.  Normal auditory canals and external ears. Normal external nose, mucus membranes and septum.  Normal pharynx. Cardiovascular- S1, S2 normal, pulses palpable throughout   Lungs- chest clear, no wheezing, rales, normal symmetric air entry Abdomen- soft, non-tender; bowel sounds normal; no masses,  no organomegaly Extremities- edema noted Lymph-no adenopathy palpable Musculoskeletal-no joint tenderness, deformity or swelling Skin-warm and dry, no hyperpigmentation, vitiligo, or suspicious lesions  Neurological Examination  S/p rocuronium Mental Status: Patient does not respond to verbal stimuli.  Does not respond to deep sternal rub.  Does not follow commands.  No verbalizations noted.  Cranial Nerves: II: patient does not respond confrontation bilaterally, pupils right 2 mm, left 2 mm,and unreactive bilaterally III,IV,VI: doll's response absent bilaterally. Cold  caloric responses absent bilaterally. V,VII: corneal reflex absent bilaterally  VIII: patient does not respond to verbal stimuli IX,X: gag reflex reduced, XI: trapezius strength unable to test bilaterally XII: tongue strength unable to test Motor: Extremities flaccid throughout.  No spontaneous movement noted.  No purposeful movements noted. Sensory: Does not respond to noxious stimuli in any extremity. Deep Tendon Reflexes:  Absent throughout. Plantars: Mute bilaterally Cerebellar: Unable to perform   Laboratory Studies:  Basic Metabolic Panel: Recent Labs  Lab 02/16/18 1406  NA 141  K 4.3  CL 107  CO2 24  GLUCOSE 154*  BUN 21*  CREATININE 0.98  CALCIUM 9.5    Liver Function Tests: Recent Labs  Lab 02/16/18 1406  AST 25  ALT 11*  ALKPHOS 111  BILITOT 0.3  PROT 7.8  ALBUMIN 4.2   No results for input(s): LIPASE, AMYLASE in the  last 168 hours. No results for input(s): AMMONIA in the last 168 hours.  CBC: Recent Labs  Lab 02/16/18 1406  WBC 5.7  NEUTROABS 2.7  HGB 11.3*  HCT 34.6*  MCV 90.9  PLT 256    Cardiac Enzymes: Recent Labs  Lab 02/16/18 1406  TROPONINI <0.03    BNP: Invalid input(s): POCBNP  CBG: Recent Labs  Lab 02/16/18 1349  GLUCAP 141*    Microbiology: Results for orders placed or performed during the hospital encounter of 09/04/17  Urine Culture     Status: Abnormal   Collection Time: 09/07/17  8:03 AM  Result Value Ref Range Status   Specimen Description URINE, CLEAN CATCH  Final   Special Requests NONE  Final   Culture MULTIPLE SPECIES PRESENT, SUGGEST RECOLLECTION (A)  Final   Report Status 09/08/2017 FINAL  Final  Culture, Urine     Status: Abnormal   Collection Time: 09/08/17  3:39 PM  Result Value Ref Range Status   Specimen Description URINE, CATHETERIZED  Final   Special Requests NONE  Final   Culture (A)  Final    >=100,000 COLONIES/mL LACTOBACILLUS SPECIES Standardized susceptibility testing for this  organism is not available.    Report Status 09/10/2017 FINAL  Final    Coagulation Studies: Recent Labs    02/16/18 1406  LABPROT 12.8  INR 0.97    Urinalysis: No results for input(s): COLORURINE, LABSPEC, PHURINE, GLUCOSEU, HGBUR, BILIRUBINUR, KETONESUR, PROTEINUR, UROBILINOGEN, NITRITE, LEUKOCYTESUR in the last 168 hours.  Invalid input(s): APPERANCEUR  Lipid Panel: No results found for: CHOL, TRIG, HDL, CHOLHDL, VLDL, LDLCALC  HgbA1C:  Lab Results  Component Value Date   HGBA1C 6.8 (H) 09/02/2017    Urine Drug Screen:      Component Value Date/Time   LABOPIA NONE DETECTED 09/01/2017 1422   COCAINSCRNUR NONE DETECTED 09/01/2017 1422   LABBENZ NONE DETECTED 09/01/2017 1422   AMPHETMU NONE DETECTED 09/01/2017 1422   THCU NONE DETECTED 09/01/2017 1422   LABBARB NONE DETECTED 09/01/2017 1422    Alcohol Level: No results for input(s): ETH in the last 168 hours.   Imaging: Dg Chest Portable 1 View  Result Date: 02/16/2018 CLINICAL DATA:  Tube placement EXAM: PORTABLE CHEST 1 VIEW COMPARISON:  09/08/2017 FINDINGS: Endotracheal tube tip at the carina and is directed toward the right mainstem bronchus. There are low lung volumes. Esophageal tube tip is below the diaphragm but is not included. Borderline to mild cardiomegaly. No focal pulmonary opacity. No pneumothorax. IMPRESSION: 1. Endotracheal tube tip at the carina and is directed toward right mainstem bronchus 2. Low lung volumes with borderline cardiomegaly. Electronically Signed   By: Jasmine Pang M.D.   On: 02/16/2018 14:28   Ct Head Code Stroke Wo Contrast  Result Date: 02/16/2018 CLINICAL DATA:  Code stroke. 77 year old female with weakness and altered mental status progressing to unresponsive. Intubated. Prior intracranial hemorrhage in September 2018. EXAM: CT HEAD WITHOUT CONTRAST TECHNIQUE: Contiguous axial images were obtained from the base of the skull through the vertex without intravenous contrast.  COMPARISON:  09/03/2017 and earlier. FINDINGS: Brain: Large left hemisphere hyperdense intracranial hemorrhage which appears to have originated today in the left superior frontal gyrus. Estimated intra-axial hemorrhage volume of 57 milliliters, with hyperdense blood encompassing 52 x 45 by 49 millimeters (AP by transverse by CC). Secondary extension into both the subarachnoid space and the left lateral ventricle with moderate to large volume of intraventricular hemorrhage. Comparatively small volume of left convexity subarachnoid hemorrhage. No subdural or epidural  blood identified. Ventriculomegaly with transependymal edema suspected. Intracranial mass effect with both rightward midline shift of 10 millimeters and partial generalized effacement of the basilar cisterns (sagittal image 25). The left lateral temporal lobe hemorrhage site demonstrates encephalomalacia as expected. Stable gray-Dung matter differentiation in the right hemisphere and posterior fossa except for transependymal edema. Vascular: No suspicious intracranial vascular hyperdensity. Skull: Stable and intact. Sinuses/Orbits: Stable and well pneumatized. Other: No acute orbit or scalp soft tissue findings. ASPECTS [facility] Stroke Program Early CT Score): Not applicable, acute hemorrhage. IMPRESSION: 1. Large volume acute intracranial hemorrhage. Left superior frontal gyrus intra-axial hemorrhage (estimated intra-axial volume 57 mL) with moderate to large volume extension into the ventricles, and small volume extension into the left subarachnoid space. Considering the prior left temporal lobar hemorrhage in 2018 the top differential considerations include amyloid angiopathy, and underlying coagulopathy. 2. Partial effacement of the basilar cisterns and rightward midline shift of 10 mm. Ventriculomegaly with transependymal edema. 3. ASPECTS is not applicable, acute hemorrhage. Electronically Signed   By: Odessa Fleming M.D.   On: 02/16/2018 14:36     Assessment: 77 y.o. female presenting after becoming unresponsive while preparing to have an MRI of the brain.  On presentation with left eye deviation, pupils reactive, weak gag, flaccid quadriparesis.  Patient on no antiplatelet or anticoagulant therapy at home due to prior history of ICH in September of 2018.  Head CT reviewed and shows extensive left frontal hemorrhage with intraventricular extension and midline shift.  ICH score of 4.  Discussed patient's diagnosis with daughter who reports they want everything done to possibly extend her life but patient with poor prognosis per ICH score of 4.    Stroke Risk Factors - atrial fibrillation, diabetes mellitus and hypertension  Plan: 1. Hold all anticoagulant and antiplatelet therapy 2. NPO  3. Telemetry monitoring 4. Frequent neuro checks 5. SCD's 6. Neurosurgical evaluation.  If they have nothing to offer patient, to be admitted to ICU 7. BP control   Thana Farr, MD Neurology [phone] 02/16/2018, 2:47 PM

## 2018-02-16 NOTE — ED Notes (Signed)
Pt returns from ct  

## 2018-02-16 NOTE — ED Notes (Signed)
5 mg labetalol infiltrated

## 2018-02-16 NOTE — Consult Note (Addendum)
Referring Physician:  No referring provider defined for this encounter.  Primary Physician:  Hyman Hopes, MD  Chief Complaint:  Hemorrhagic stroke  History of Present Illness: Yvette Collier is a 77 y.o. female who presents as a consult for hemorrhagic stroke with a history significant for previous ICH with midline shift and involvement of ventricles.  History obtained from ED provider:  Patient was at [facility] for scheduled MRI when she collapsed and was brought to the ED. Upon evaluation patient had GCS of 3 and was unable to protect airway. Patient was intubated. Head CT revealed large ICH.   Neurosurgery consulted to discuss possible EVD with family.   Review of Systems:  A 10 point review of systems is negative, except for the pertinent positives and negatives detailed in the HPI.  Past Medical History: Past Medical History:  Diagnosis Date  . Asthma   . Chronic lower back pain   . COPD (chronic obstructive pulmonary disease) (HCC)   . Diabetes mellitus without complication (HCC)   . Gastrointestinal bleed   . GERD (gastroesophageal reflux disease)   . History of colon polyps   . Hypertension   . Hypertensive heart disease   . IBS (irritable bowel syndrome)   . Ischemic colitis (HCC)   . Non-obstructive Coronary Artery Disease    a. 05/2009 Cath [facility]): minimal nonobs dzs.  . Osteoarthritis   . PAF (paroxysmal atrial fibrillation) (HCC)    a. 09/2014 during GI illness;  b. CHA2DS2VASc = 5 (not currently on OAC 2/2 h/o GIB/ischemic colitis); c. 09/2014 Echo: EF 60-65%, imparied relaxation, nl RV size/fxn.  . Stroke (HCC)   . Transient cerebral ischemia due to atrial fibrillation [facility])     Past Surgical History: Past Surgical History:  Procedure Laterality Date  . ABDOMINAL HYSTERECTOMY    . APPENDECTOMY    . BACK SURGERY     x 3, last 2004.  . CHOLECYSTECTOMY    . COLONOSCOPY WITH PROPOFOL N/A 03/20/2017   Procedure: COLONOSCOPY WITH PROPOFOL;  Surgeon: Wyline Mood, MD;  Location: [facility] ENDOSCOPY;  Service: Endoscopy;  Laterality: N/A;  . ESOPHAGOGASTRODUODENOSCOPY (EGD) WITH PROPOFOL N/A 03/20/2017   Procedure: ESOPHAGOGASTRODUODENOSCOPY (EGD) WITH PROPOFOL;  Surgeon: Wyline Mood, MD;  Location: [facility] ENDOSCOPY;  Service: Endoscopy;  Laterality: N/A;  . FOOT SURGERY Right   . HEMORRHOID BANDING    . left total hip arthroplasty  2012  . STOMACH SURGERY    . TONSILLECTOMY      Allergies: Allergies as of 02/16/2018 - Review Complete 02/16/2018  Allergen Reaction Noted  . Lisinopril Swelling and Other (See Comments) 07/13/2015  . Baclofen  02/02/2018  . Prednisone Other (See Comments) 09/01/2017  . Hctz [hydrochlorothiazide] Other (See Comments) 09/01/2017  . Ivp dye [iodinated diagnostic agents] Hives 07/14/2013  . Venlafaxine Hives and Other (See Comments) 07/14/2013  . Sulfa antibiotics Hives and Rash 07/14/2013    Medications:  Current Facility-Administered Medications:  .  fentaNYL in NS (77mcg/ml) infusion-PREMIX, 0-400 mcg/hr, Intravenous, Continuous, Rockne Menghini, MD, Last Rate: 2.5 mL/hr at 02/16/18 1450, 25 mcg/hr at 02/16/18 1450 .  labetalol (NORMODYNE,TRANDATE) injection 5 mg, 5 mg, Intravenous, Once, Rockne Menghini, MD .  labetalol (NORMODYNE,TRANDATE) injection 5 mg, 5 mg, Intravenous, Q10 min PRN, Rockne Menghini, MD, 5 mg at 02/16/18 1445 .  levETIRAcetam (KEPPRA) 1,000 mg in sodium chloride 0.9 % 100 mL IVPB, 1,000 mg, Intravenous, Once, Sharma Covert, Anne-Caroline, MD .  mannitol 25 % injection 50 g, 50 g, Intravenous, Once, Sharma Covert,  Anne-Caroline, MD .  midazolam (VERSED) 50 mg in sodium chloride 0.9 % 50 mL (1 mg/mL) infusion, 0.5 mg/hr, Intravenous, Continuous, Rockne MenghiniNorman, Anne-Caroline, MD, Last Rate: 2 mL/hr at 02/16/18 1450, 2 mg/hr at 02/16/18 1450 .  naloxone (NARCAN) 2 MG/2ML injection, , , ,   Current Outpatient Medications:  .  cetirizine (ZYRTEC) 10 MG tablet, Take 10 mg by mouth daily.,  Disp: , Rfl:  .  Cholecalciferol (VITAMIN D3) 1000 units CAPS, Take 1,000 Units by mouth daily., Disp: , Rfl:  .  dexlansoprazole (DEXILANT) 60 MG capsule, Take 60 mg by mouth daily., Disp: , Rfl:  .  dicyclomine (BENTYL) 10 MG capsule, TAKE 1 CAPSULE BY MOUTH 4 TIMES DAILY BEFORE MEALS AND AT BEDTIME, Disp: 120 capsule, Rfl: 3 .  losartan (COZAAR) 50 MG tablet, Take 50 mg by mouth daily., Disp: , Rfl:  .  metoprolol tartrate (LOPRESSOR) 25 MG tablet, Take 0.5 tablets (12.5 mg total) by mouth 2 (two) times daily., Disp: 60 tablet, Rfl: 0 .  Probiotic Product (VSL#3) CAPS, Take 1 capsule by mouth 2 (two) times daily., Disp: 60 capsule, Rfl: 11 .  simvastatin (ZOCOR) 10 MG tablet, Take 1 tablet (10 mg total) by mouth every evening., Disp: 30 tablet, Rfl: 0 .  pregabalin (LYRICA) 25 MG capsule, Limit  1 tablet by mouth per day or twice per day if tolerated (Patient not taking: Reported on 02/16/2018), Disp: 60 capsule, Rfl: 0   Social History: Social History   Tobacco Use  . Smoking status: Never Smoker  . Smokeless tobacco: Never Used  Substance Use Topics  . Alcohol use: No  . Drug use: No    Family Medical History: Family History  Problem Relation Age of Onset  . Heart attack Mother   . Hypertension Mother   . Cancer Mother   . Heart disease Father   . Hypertension Father   . Heart attack Father   . Diabetes Father   . Cancer Father   . Hypertension Brother   . Diabetes Brother   . Hypertension Maternal Aunt     Physical Examination: Vitals:   02/16/18 1401 02/16/18 1500  BP: (!) 221/88 (!) 186/94  Pulse: 80 (!) 58  Resp:  (!) 22  Temp: 98.1 F (36.7 C)   SpO2: 100% 100%   General: Patient intubated and unresponsive.  Head:  Pupils pinpoint constriction and unresponsive.  Imaging: EXAM: CT HEAD WITHOUT CONTRAST  TECHNIQUE: Contiguous axial images were obtained from the base of the skull through the vertex without intravenous contrast.  COMPARISON:   09/03/2017 and earlier.  FINDINGS: Brain: Large left hemisphere hyperdense intracranial hemorrhage which appears to have originated today in the left superior frontal gyrus. Estimated intra-axial hemorrhage volume of 57 milliliters, with hyperdense blood encompassing 52 x 45 by 49 millimeters (AP by transverse by CC). Secondary extension into both the subarachnoid space and the left lateral ventricle with moderate to large volume of intraventricular hemorrhage. Comparatively small volume of left convexity subarachnoid hemorrhage. No subdural or epidural blood identified.  Ventriculomegaly with transependymal edema suspected. Intracranial mass effect with both rightward midline shift of 10 millimeters and partial generalized effacement of the basilar cisterns (sagittal image 25).  The left lateral temporal lobe hemorrhage site demonstrates encephalomalacia as expected. Stable gray-Morad matter differentiation in the right hemisphere and posterior fossa except for transependymal edema.  Vascular: No suspicious intracranial vascular hyperdensity.  Skull: Stable and intact.  Sinuses/Orbits: Stable and well pneumatized.  Other: No acute orbit or scalp  soft tissue findings.  ASPECTS [facility] Stroke Program Early CT Score): Not applicable, acute hemorrhage.  IMPRESSION: 1. Large volume acute intracranial hemorrhage. Left superior frontal gyrus intra-axial hemorrhage (estimated intra-axial volume 57 mL) with moderate to large volume extension into the ventricles, and small volume extension into the left subarachnoid space. Considering the prior left temporal lobar hemorrhage in 2018 the top differential considerations include amyloid angiopathy, and underlying coagulopathy. 2. Partial effacement of the basilar cisterns and rightward midline shift of 10 mm. Ventriculomegaly with transependymal edema. 3. ASPECTS is not applicable, acute hemorrhage.  Electronically  Signed: By: Odessa Fleming M.D. On: 02/16/2018 14:36   Assessment and Plan: Ms. Breiner is a pleasant 77 y.o. female with a large volume acute intracranial hemorrhage with midline shift and ventricular involvement. Spoke to family and informed them of options to remain at [facility] for comfort care or option for transferring patient to another facility that could offer EVD. Family is unable to decide at this time how they would like to proceed. Informed prognosis is not promising with either option but EVD could provide family more time.  After speaking to Dr. Sharma Covert and informing Dr. Adriana Simas that platelets are not on site/readily accessible and EEG monitoring is not available at [facility], it was decided that a transfer would be the most appropriate route. This would also allow for EVD placement if the family decided that they would like to proceed with surgical intervention.  Recommendations per Dr. Adriana Simas:  - INR, pTT (completed: wnl)  - Consider platelets if on aspirin  - Keppra 1gram q 12 hours  - EEG monitoring  - maintain systolic BP <160   - consider transfer for EVD  - mannitol   Defer to neurology for care of hemorrhagic stroke.   Ivar Drape, PA-C Dept. of Neurosurgery

## 2018-02-17 ENCOUNTER — Ambulatory Visit: Payer: Medicare Other

## 2018-02-17 ENCOUNTER — Encounter: Payer: Medicare Other | Admitting: Occupational Therapy

## 2018-02-17 MED FILL — Medication: Qty: 1 | Status: AC

## 2018-02-19 ENCOUNTER — Encounter: Payer: Medicare Other | Admitting: Occupational Therapy

## 2018-02-19 ENCOUNTER — Ambulatory Visit: Payer: Medicare Other

## 2018-02-20 DEATH — deceased

## 2018-02-24 ENCOUNTER — Ambulatory Visit: Payer: Medicare Other | Admitting: Speech Pathology

## 2018-02-26 ENCOUNTER — Encounter: Payer: Medicare Other | Admitting: Speech Pathology

## 2018-03-03 ENCOUNTER — Encounter: Payer: Medicare Other | Admitting: Occupational Therapy

## 2018-03-03 ENCOUNTER — Encounter: Payer: Medicare Other | Admitting: Speech Pathology

## 2018-03-05 ENCOUNTER — Encounter: Payer: Medicare Other | Admitting: Occupational Therapy

## 2018-03-05 ENCOUNTER — Ambulatory Visit: Payer: Medicare Other | Admitting: Speech Pathology

## 2018-03-10 ENCOUNTER — Ambulatory Visit: Payer: Medicare Other | Admitting: Speech Pathology

## 2018-03-10 ENCOUNTER — Encounter: Payer: Medicare Other | Admitting: Occupational Therapy

## 2018-03-12 ENCOUNTER — Encounter: Payer: Medicare Other | Admitting: Occupational Therapy

## 2018-03-12 ENCOUNTER — Ambulatory Visit: Payer: Medicare Other | Admitting: Speech Pathology

## 2018-03-17 ENCOUNTER — Encounter: Payer: Medicare Other | Admitting: Speech Pathology

## 2018-03-17 ENCOUNTER — Encounter: Payer: Medicare Other | Admitting: Occupational Therapy

## 2018-03-19 ENCOUNTER — Encounter: Payer: Medicare Other | Admitting: Speech Pathology

## 2018-03-19 ENCOUNTER — Encounter: Payer: Medicare Other | Admitting: Occupational Therapy

## 2018-03-23 DEATH — deceased

## 2018-08-11 ENCOUNTER — Ambulatory Visit: Payer: Medicare Other | Admitting: Diagnostic Neuroimaging

## 2018-11-11 IMAGING — CT CT HEAD W/O CM
4 series · 16 of 47 positions shown, 18 images · non-contrast
Comparison: CT HEAD September 01, 2017

CLINICAL DATA: Followup intracranial hemorrhage.

EXAM:
CT HEAD WITHOUT CONTRAST
TECHNIQUE: Contiguous axial images were obtained from the base of the skull
through the vertex without intravenous contrast.

[Series 3: head without · axial · non-contrast · 0.42mm/px · z∈[-55,+60]mm · 7 of 31 slices shown, 9 images]
[im 4/31  brain]
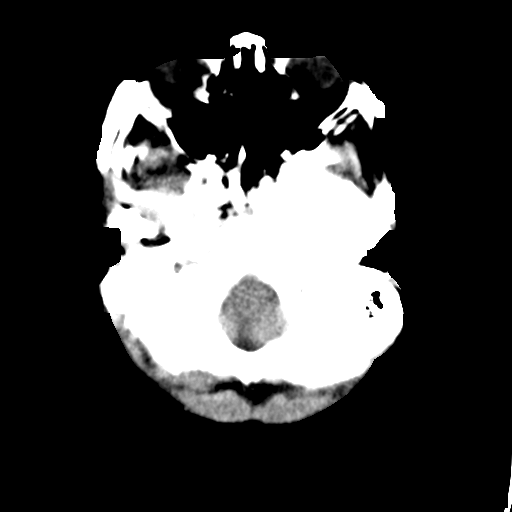
[im 4/31  bone]
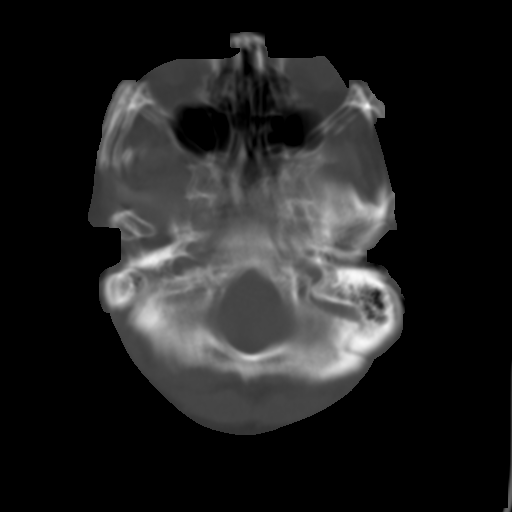
[im 8/31  brain]
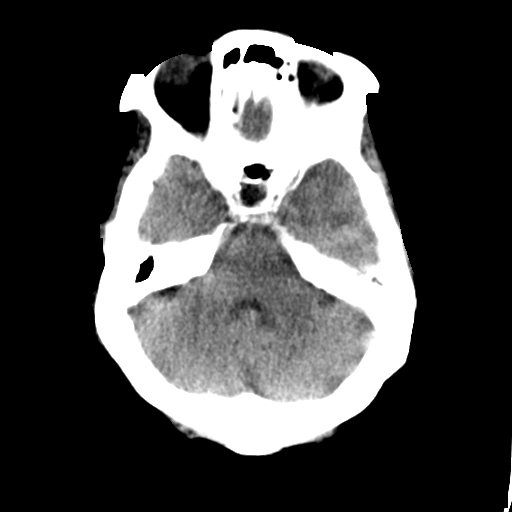
[im 12/31  brain]
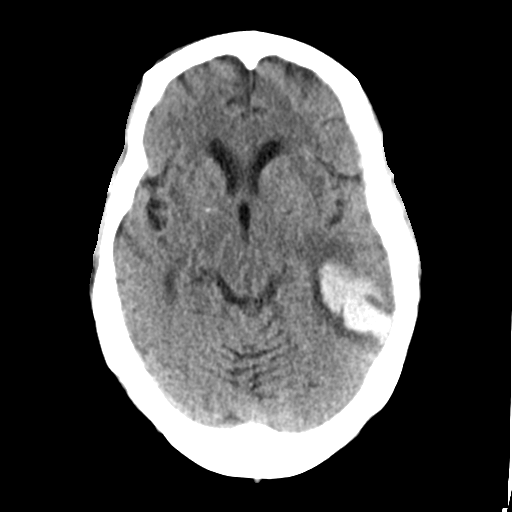
[im 16/31  brain]
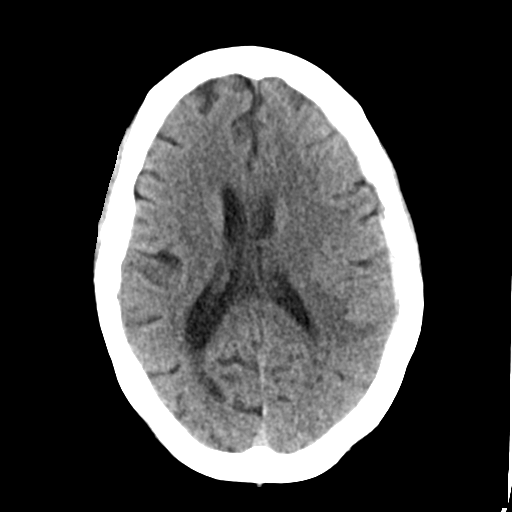
[im 19/31  brain]
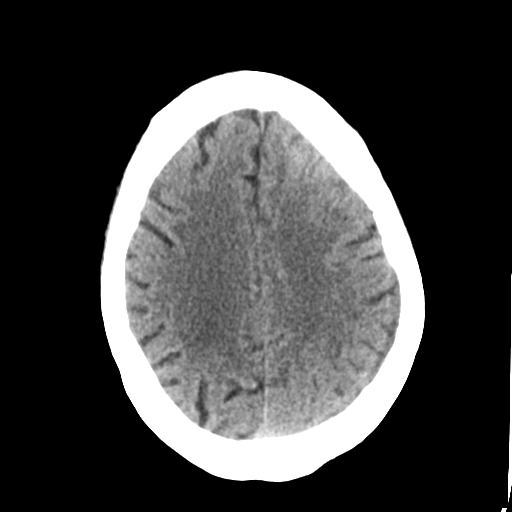
[im 19/31  bone]
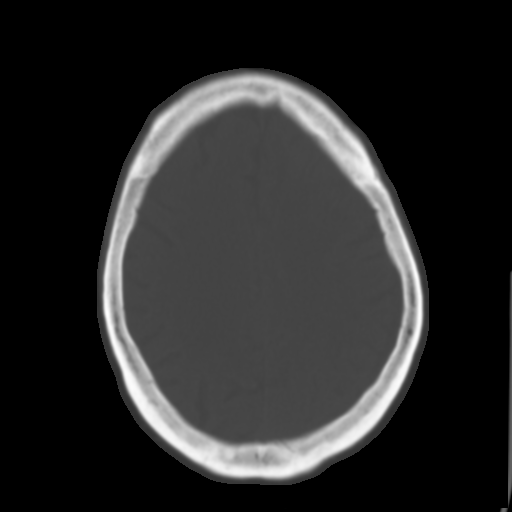
[im 23/31  brain]
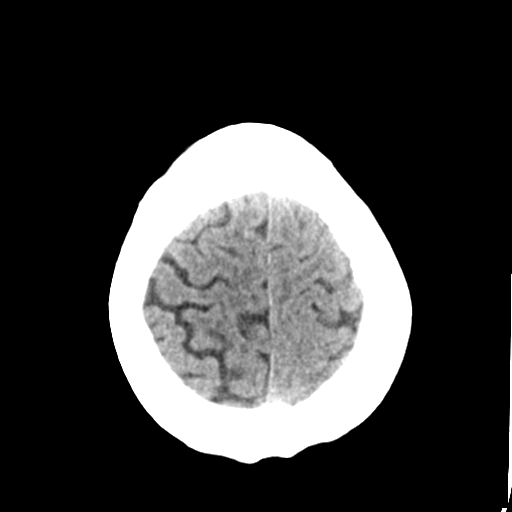
[im 27/31  brain]
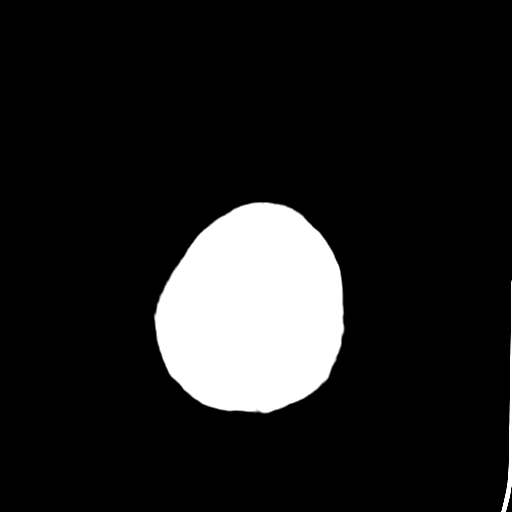

[Series 4: head bone · axial · 0.42mm/px · z∈[-56,-26]mm · 3 of 76 slices shown]
[im 8/76  bone]
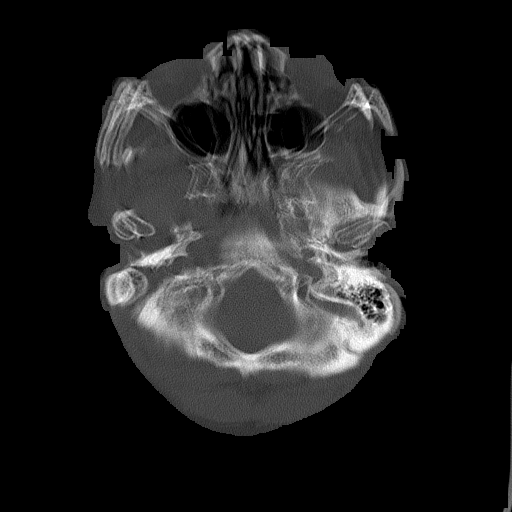
[im 16/76  bone]
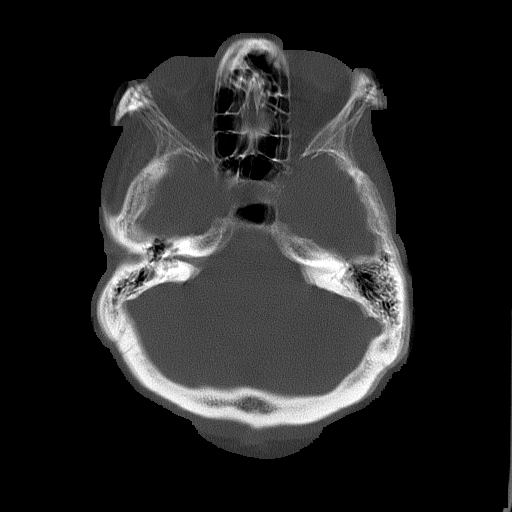
[im 23/76  bone]
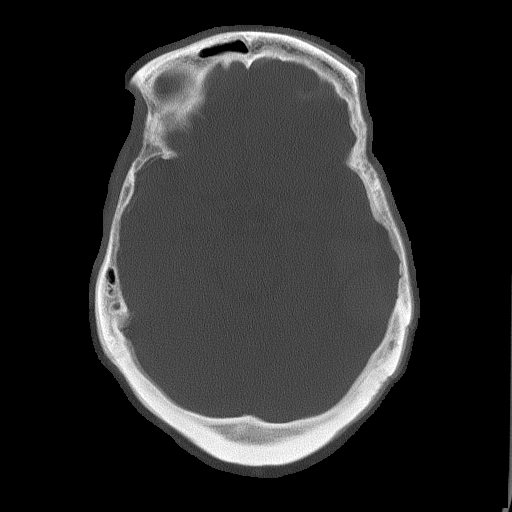

[Series 5: head without cor · coronal · non-contrast · 0.30mm/px · 3 of 71 slices shown]
[im 24/71  brain]
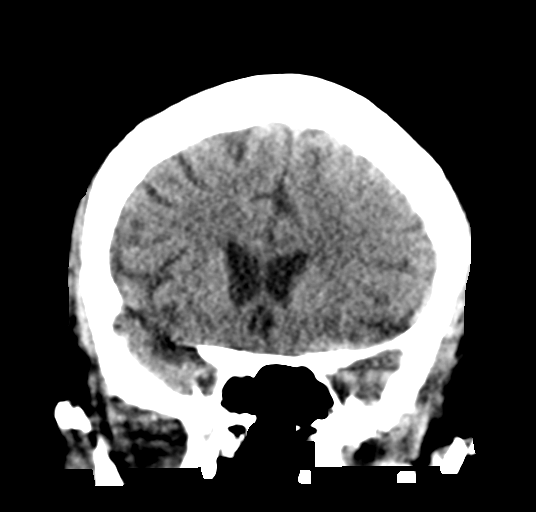
[im 32/71  brain]
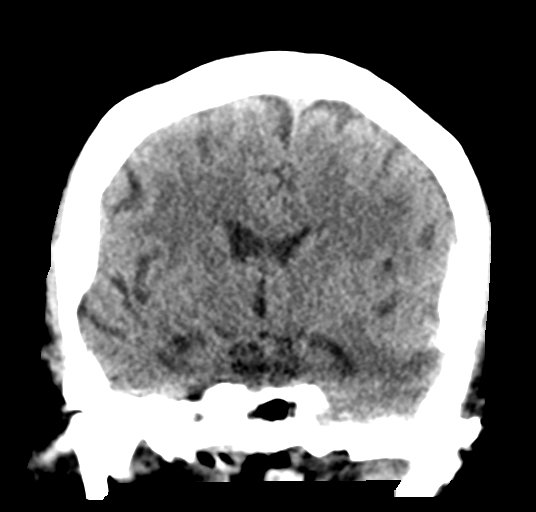
[im 39/71  brain]
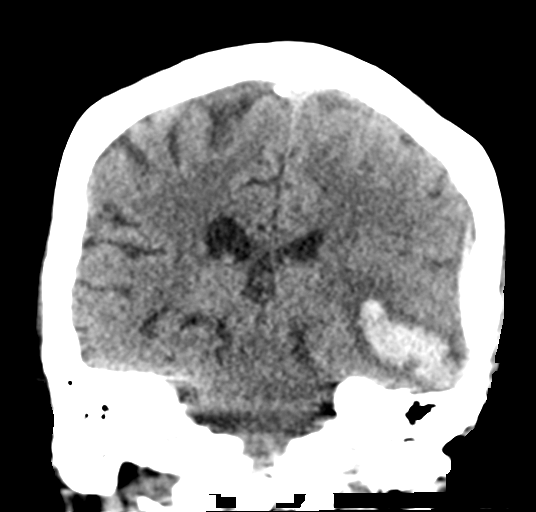

[Series 6: head without sag · sagittal · non-contrast · 0.30mm/px · 3 of 55 slices shown]
[im 19/55  brain]
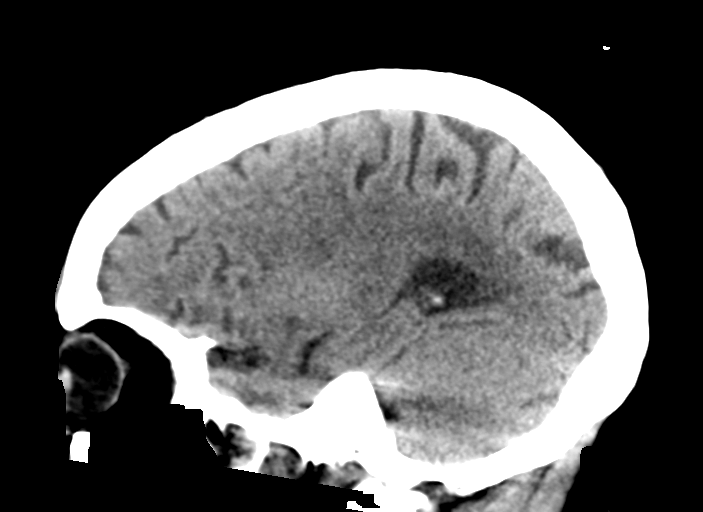
[im 28/55  brain]
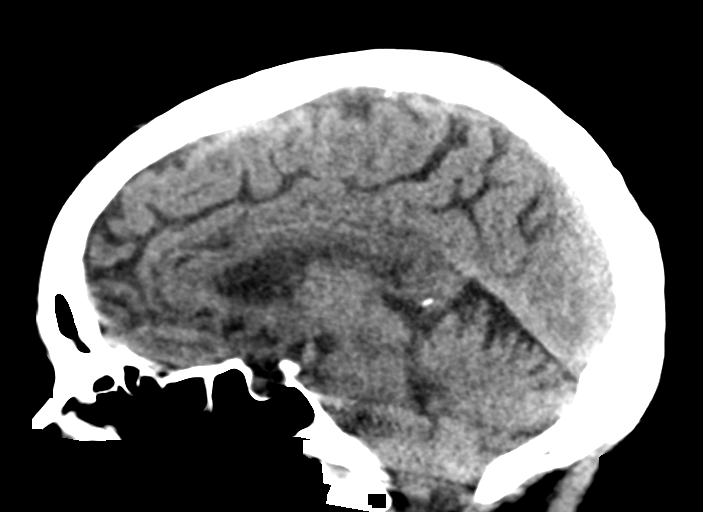
[im 37/55  brain]
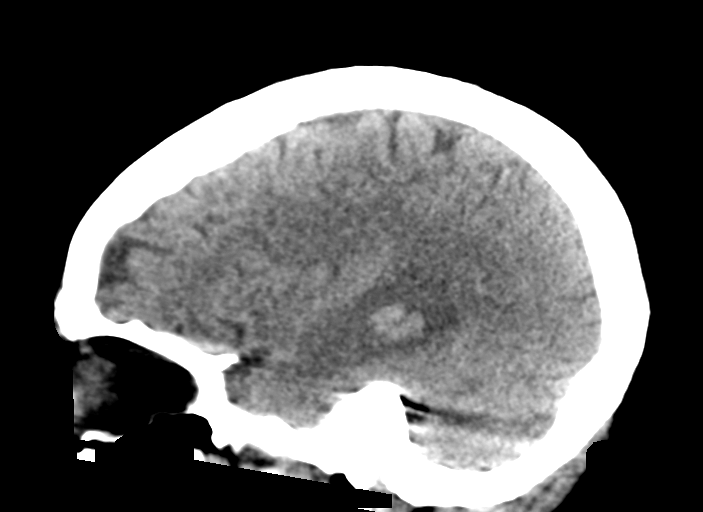

[16 of 47 positions shown; findings below may reference images not displayed]

FINDINGS: Moderately motion degraded examination.

BRAIN: Evolving 4 x 1 x 2.4 cm LEFT temporal intraparenchymal
hematoma, relatively unchanged with surrounding low-density
vasogenic edema. Regional mass effect. 5 mm LEFT-to-RIGHT midline
shift. No ventricular entrapment. No hydrocephalus. No acute large
vascular territory infarct. Patchy supratentorial white matter
hypodensities compatible with mild chronic small vessel ischemic
disease, less than expected for age. Trace possible LEFT subdural
hematoma versus motion artifact. Basal cisterns are patent.

VASCULAR: Mild calcific atherosclerosis of the carotid siphons.

SKULL: No skull fracture. No significant scalp soft tissue swelling.

SINUSES/ORBITS: The mastoid air-cells and included paranasal sinuses
are well-aerated.The included ocular globes and orbital contents are
non-suspicious.

OTHER: None.
IMPRESSION: 1. Moderately motion degraded examination.
2. Evolving LEFT temporal lobe hematoma, relatively stable. 5 mm
LEFT to RIGHT midline shift without ventricular entrapment.
3. Trace LEFT holo hemispheric subdural hematoma versus motion
artifact.
